# Patient Record
Sex: Male | Born: 1940
Health system: Southern US, Community
[De-identification: ages and names within clinical notes are randomized; demographics above are authoritative.]

## PROBLEM LIST (undated history)

## (undated) DIAGNOSIS — G20A1 Parkinson's disease without dyskinesia, without mention of fluctuations: Secondary | ICD-10-CM

## (undated) DIAGNOSIS — I471 Supraventricular tachycardia: Secondary | ICD-10-CM

## (undated) DIAGNOSIS — K649 Unspecified hemorrhoids: Secondary | ICD-10-CM

## (undated) DIAGNOSIS — I4589 Other specified conduction disorders: Secondary | ICD-10-CM

## (undated) DIAGNOSIS — G4700 Insomnia, unspecified: Secondary | ICD-10-CM

## (undated) DIAGNOSIS — H8110 Benign paroxysmal vertigo, unspecified ear: Secondary | ICD-10-CM

## (undated) DIAGNOSIS — E1169 Type 2 diabetes mellitus with other specified complication: Secondary | ICD-10-CM

## (undated) DIAGNOSIS — N529 Male erectile dysfunction, unspecified: Secondary | ICD-10-CM

## (undated) DIAGNOSIS — K224 Dyskinesia of esophagus: Secondary | ICD-10-CM

## (undated) DIAGNOSIS — K219 Gastro-esophageal reflux disease without esophagitis: Secondary | ICD-10-CM

## (undated) DIAGNOSIS — K5790 Diverticulosis of intestine, part unspecified, without perforation or abscess without bleeding: Secondary | ICD-10-CM

## (undated) DIAGNOSIS — I2510 Atherosclerotic heart disease of native coronary artery without angina pectoris: Secondary | ICD-10-CM

## (undated) DIAGNOSIS — E669 Obesity, unspecified: Secondary | ICD-10-CM

## (undated) DIAGNOSIS — G309 Alzheimer's disease, unspecified: Secondary | ICD-10-CM

## (undated) DIAGNOSIS — G20 Parkinson's disease: Secondary | ICD-10-CM

## (undated) DIAGNOSIS — Z9861 Coronary angioplasty status: Secondary | ICD-10-CM

## (undated) DIAGNOSIS — I214 Non-ST elevation (NSTEMI) myocardial infarction: Secondary | ICD-10-CM

## (undated) DIAGNOSIS — L409 Psoriasis, unspecified: Secondary | ICD-10-CM

## (undated) DIAGNOSIS — E785 Hyperlipidemia, unspecified: Secondary | ICD-10-CM

## (undated) DIAGNOSIS — Z86010 Personal history of colonic polyps: Secondary | ICD-10-CM

## (undated) DIAGNOSIS — F0280 Dementia in other diseases classified elsewhere without behavioral disturbance: Secondary | ICD-10-CM

## (undated) DIAGNOSIS — R001 Bradycardia, unspecified: Secondary | ICD-10-CM

## (undated) HISTORY — DX: Parkinson's disease: G20

## (undated) HISTORY — PX: WRIST FRACTURE SURGERY: SHX121

## (undated) HISTORY — PX: CATARACT EXTRACTION: SUR2

## (undated) HISTORY — DX: Insomnia, unspecified: G47.00

## (undated) HISTORY — DX: Dementia in other diseases classified elsewhere, unspecified severity, without behavioral disturbance, psychotic disturbance, mood disturbance, and anxiety: G30.9

## (undated) HISTORY — DX: Male erectile dysfunction, unspecified: N52.9

## (undated) HISTORY — DX: Dementia in other diseases classified elsewhere, unspecified severity, without behavioral disturbance, psychotic disturbance, mood disturbance, and anxiety: F02.80

## (undated) HISTORY — DX: Atherosclerotic heart disease of native coronary artery without angina pectoris: I25.10

## (undated) HISTORY — DX: Type 2 diabetes mellitus with other specified complication: E11.69

## (undated) HISTORY — DX: Bradycardia, unspecified: R00.1

## (undated) HISTORY — DX: Other specified conduction disorders: I45.89

## (undated) HISTORY — DX: Gastro-esophageal reflux disease without esophagitis: K21.9

## (undated) HISTORY — DX: Psoriasis, unspecified: L40.9

## (undated) HISTORY — PX: CORONARY ANGIOPLASTY WITH STENT PLACEMENT: SHX49

## (undated) HISTORY — DX: Parkinson's disease without dyskinesia, without mention of fluctuations: G20.A1

## (undated) HISTORY — DX: Non-ST elevation (NSTEMI) myocardial infarction: I21.4

## (undated) HISTORY — DX: Atherosclerotic heart disease of native coronary artery without angina pectoris: Z98.61

## (undated) HISTORY — DX: Personal history of colonic polyps: Z86.010

## (undated) HISTORY — DX: Benign paroxysmal vertigo, unspecified ear: H81.10

## (undated) HISTORY — PX: SHOULDER SURGERY: SHX246

## (undated) HISTORY — DX: Supraventricular tachycardia: I47.1

## (undated) HISTORY — DX: Hyperlipidemia, unspecified: E78.5

## (undated) HISTORY — PX: HERNIA REPAIR: SHX51

## (undated) HISTORY — DX: Unspecified hemorrhoids: K64.9

## (undated) HISTORY — DX: Dyskinesia of esophagus: K22.4

## (undated) HISTORY — DX: Diverticulosis of intestine, part unspecified, without perforation or abscess without bleeding: K57.90

## (undated) HISTORY — DX: Type 2 diabetes mellitus with other specified complication: E66.9

---

## 1997-10-05 ENCOUNTER — Encounter: Admission: RE | Admit: 1997-10-05 | Discharge: 1998-01-03 | Payer: Self-pay | Admitting: Emergency Medicine

## 1998-06-01 ENCOUNTER — Encounter: Payer: Self-pay | Admitting: Orthopedic Surgery

## 1998-06-01 ENCOUNTER — Ambulatory Visit (HOSPITAL_COMMUNITY): Admission: RE | Admit: 1998-06-01 | Discharge: 1998-06-01 | Payer: Self-pay | Admitting: Orthopedic Surgery

## 1999-02-19 ENCOUNTER — Encounter: Payer: Self-pay | Admitting: Orthopedic Surgery

## 1999-02-20 ENCOUNTER — Observation Stay (HOSPITAL_COMMUNITY): Admission: RE | Admit: 1999-02-20 | Discharge: 1999-02-21 | Payer: Self-pay | Admitting: Orthopedic Surgery

## 2001-02-23 DIAGNOSIS — I471 Supraventricular tachycardia, unspecified: Secondary | ICD-10-CM

## 2001-02-23 HISTORY — DX: Supraventricular tachycardia: I47.1

## 2001-02-23 HISTORY — DX: Supraventricular tachycardia, unspecified: I47.10

## 2001-07-12 DIAGNOSIS — Z9861 Coronary angioplasty status: Secondary | ICD-10-CM

## 2001-07-12 DIAGNOSIS — I2510 Atherosclerotic heart disease of native coronary artery without angina pectoris: Secondary | ICD-10-CM | POA: Insufficient documentation

## 2001-11-08 DIAGNOSIS — C4492 Squamous cell carcinoma of skin, unspecified: Secondary | ICD-10-CM

## 2001-11-08 HISTORY — DX: Squamous cell carcinoma of skin, unspecified: C44.92

## 2001-11-13 DIAGNOSIS — C4492 Squamous cell carcinoma of skin, unspecified: Secondary | ICD-10-CM

## 2001-11-13 HISTORY — DX: Squamous cell carcinoma of skin, unspecified: C44.92

## 2001-11-22 ENCOUNTER — Encounter: Admission: RE | Admit: 2001-11-22 | Discharge: 2001-11-22 | Payer: Self-pay | Admitting: Cardiology

## 2001-11-22 ENCOUNTER — Encounter: Payer: Self-pay | Admitting: Cardiology

## 2001-11-23 ENCOUNTER — Ambulatory Visit (HOSPITAL_COMMUNITY): Admission: RE | Admit: 2001-11-23 | Discharge: 2001-11-24 | Payer: Self-pay | Admitting: Cardiology

## 2001-11-23 DIAGNOSIS — I214 Non-ST elevation (NSTEMI) myocardial infarction: Secondary | ICD-10-CM

## 2001-11-23 HISTORY — DX: Non-ST elevation (NSTEMI) myocardial infarction: I21.4

## 2001-11-23 HISTORY — PX: PERCUTANEOUS CORONARY STENT INTERVENTION (PCI-S): SHX6016

## 2001-11-24 ENCOUNTER — Encounter: Payer: Self-pay | Admitting: Cardiology

## 2002-07-04 ENCOUNTER — Ambulatory Visit (HOSPITAL_COMMUNITY): Admission: RE | Admit: 2002-07-04 | Discharge: 2002-07-04 | Payer: Self-pay | Admitting: Family Medicine

## 2002-07-04 ENCOUNTER — Encounter: Payer: Self-pay | Admitting: Family Medicine

## 2002-07-25 DIAGNOSIS — K649 Unspecified hemorrhoids: Secondary | ICD-10-CM

## 2002-07-25 HISTORY — DX: Unspecified hemorrhoids: K64.9

## 2002-08-11 ENCOUNTER — Ambulatory Visit (HOSPITAL_COMMUNITY): Admission: RE | Admit: 2002-08-11 | Discharge: 2002-08-11 | Payer: Self-pay | Admitting: Internal Medicine

## 2002-08-11 HISTORY — PX: ESOPHAGOGASTRODUODENOSCOPY: SHX1529

## 2004-08-04 ENCOUNTER — Ambulatory Visit: Payer: Self-pay | Admitting: Family Medicine

## 2005-01-09 ENCOUNTER — Ambulatory Visit: Payer: Self-pay | Admitting: Family Medicine

## 2005-09-29 ENCOUNTER — Ambulatory Visit: Payer: Self-pay | Admitting: Family Medicine

## 2006-03-21 ENCOUNTER — Emergency Department (HOSPITAL_COMMUNITY): Admission: EM | Admit: 2006-03-21 | Discharge: 2006-03-21 | Payer: Self-pay | Admitting: Emergency Medicine

## 2006-05-19 ENCOUNTER — Ambulatory Visit: Payer: Self-pay | Admitting: Family Medicine

## 2006-05-19 LAB — CONVERTED CEMR LAB
ALT: 20 units/L (ref 0–40)
Alkaline Phosphatase: 56 units/L (ref 39–117)
Basophils Relative: 0.8 % (ref 0.0–1.0)
Bilirubin, Direct: 0.1 mg/dL (ref 0.0–0.3)
CO2: 28 meq/L (ref 19–32)
Calcium: 9.2 mg/dL (ref 8.4–10.5)
Creatinine, Ser: 0.9 mg/dL (ref 0.4–1.5)
Eosinophils Relative: 4.7 % (ref 0.0–5.0)
GFR calc Af Amer: 109 mL/min
Glucose, Bld: 162 mg/dL — ABNORMAL HIGH (ref 70–99)
HCT: 45.2 % (ref 39.0–52.0)
Hemoglobin: 15.8 g/dL (ref 13.0–17.0)
LDL Cholesterol: 122 mg/dL — ABNORMAL HIGH (ref 0–99)
Lymphocytes Relative: 31.6 % (ref 12.0–46.0)
Microalb Creat Ratio: 8.7 mg/g (ref 0.0–30.0)
Monocytes Absolute: 0.5 10*3/uL (ref 0.2–0.7)
Neutro Abs: 3.4 10*3/uL (ref 1.4–7.7)
Potassium: 4.5 meq/L (ref 3.5–5.1)
RDW: 12 % (ref 11.5–14.6)
TSH: 1.55 microintl units/mL (ref 0.35–5.50)
Total Bilirubin: 0.8 mg/dL (ref 0.3–1.2)
Total Protein: 7 g/dL (ref 6.0–8.3)
VLDL: 29 mg/dL (ref 0–40)
WBC: 6.3 10*3/uL (ref 4.5–10.5)

## 2006-05-24 ENCOUNTER — Encounter: Admission: RE | Admit: 2006-05-24 | Discharge: 2006-05-24 | Payer: Self-pay | Admitting: Family Medicine

## 2006-11-18 DIAGNOSIS — E785 Hyperlipidemia, unspecified: Secondary | ICD-10-CM | POA: Insufficient documentation

## 2006-11-18 DIAGNOSIS — E119 Type 2 diabetes mellitus without complications: Secondary | ICD-10-CM | POA: Insufficient documentation

## 2006-11-18 DIAGNOSIS — I2510 Atherosclerotic heart disease of native coronary artery without angina pectoris: Secondary | ICD-10-CM | POA: Insufficient documentation

## 2006-12-08 ENCOUNTER — Encounter: Payer: Self-pay | Admitting: Family Medicine

## 2006-12-08 ENCOUNTER — Telehealth: Payer: Self-pay | Admitting: Family Medicine

## 2006-12-21 ENCOUNTER — Encounter: Payer: Self-pay | Admitting: Family Medicine

## 2007-01-24 ENCOUNTER — Encounter: Payer: Self-pay | Admitting: Family Medicine

## 2007-04-24 HISTORY — PX: TRANSTHORACIC ECHOCARDIOGRAM: SHX275

## 2007-04-27 ENCOUNTER — Inpatient Hospital Stay (HOSPITAL_COMMUNITY): Admission: EM | Admit: 2007-04-27 | Discharge: 2007-04-29 | Payer: Self-pay | Admitting: Emergency Medicine

## 2007-04-27 ENCOUNTER — Encounter: Payer: Self-pay | Admitting: Family Medicine

## 2007-04-27 ENCOUNTER — Encounter (INDEPENDENT_AMBULATORY_CARE_PROVIDER_SITE_OTHER): Payer: Self-pay | Admitting: Cardiology

## 2007-04-27 HISTORY — PX: PERCUTANEOUS CORONARY STENT INTERVENTION (PCI-S): SHX6016

## 2007-04-29 ENCOUNTER — Encounter: Payer: Self-pay | Admitting: Family Medicine

## 2007-05-16 ENCOUNTER — Encounter: Payer: Self-pay | Admitting: Family Medicine

## 2007-07-27 ENCOUNTER — Ambulatory Visit: Payer: Self-pay | Admitting: Family Medicine

## 2007-07-27 DIAGNOSIS — N138 Other obstructive and reflux uropathy: Secondary | ICD-10-CM

## 2007-07-27 DIAGNOSIS — N401 Enlarged prostate with lower urinary tract symptoms: Secondary | ICD-10-CM | POA: Insufficient documentation

## 2007-07-27 LAB — CONVERTED CEMR LAB
ALT: 17 units/L (ref 0–53)
AST: 20 units/L (ref 0–37)
Alkaline Phosphatase: 58 units/L (ref 39–117)
Basophils Absolute: 0 10*3/uL (ref 0.0–0.1)
Bilirubin Urine: NEGATIVE
Bilirubin, Direct: 0.1 mg/dL (ref 0.0–0.3)
CO2: 25 meq/L (ref 19–32)
Chloride: 113 meq/L — ABNORMAL HIGH (ref 96–112)
Cholesterol: 195 mg/dL (ref 0–200)
Creatinine, Ser: 1.2 mg/dL (ref 0.4–1.5)
Eosinophils Absolute: 0.1 10*3/uL (ref 0.0–0.7)
GFR calc non Af Amer: 64 mL/min
Glucose, Urine, Semiquant: NEGATIVE
HDL: 41.8 mg/dL (ref >39.0)
Hgb A1c MFr Bld: 6.3 % — ABNORMAL HIGH (ref 4.6–6.0)
LDL Cholesterol: 136 mg/dL — ABNORMAL HIGH (ref 0–99)
Lymphocytes Relative: 22.2 % (ref 12.0–46.0)
MCHC: 35.4 g/dL (ref 30.0–36.0)
MCV: 94.1 fL (ref 78.0–100.0)
Neutrophils Relative %: 67.3 % (ref 43.0–77.0)
Platelets: 201 10*3/uL (ref 150–400)
Potassium: 4.1 meq/L (ref 3.5–5.1)
RBC: 4.39 M/uL (ref 4.22–5.81)
RDW: 12.2 % (ref 11.5–14.6)
Sodium: 134 meq/L — ABNORMAL LOW (ref 135–145)
Total Bilirubin: 0.9 mg/dL (ref 0.3–1.2)
VLDL: 17 mg/dL (ref 0–40)
pH: 6

## 2007-07-28 ENCOUNTER — Encounter: Payer: Self-pay | Admitting: Family Medicine

## 2007-09-21 ENCOUNTER — Inpatient Hospital Stay (HOSPITAL_COMMUNITY): Admission: EM | Admit: 2007-09-21 | Discharge: 2007-09-22 | Payer: Self-pay | Admitting: Emergency Medicine

## 2007-09-21 HISTORY — PX: PERCUTANEOUS CORONARY STENT INTERVENTION (PCI-S): SHX6016

## 2007-09-23 ENCOUNTER — Telehealth: Payer: Self-pay | Admitting: Internal Medicine

## 2007-09-27 ENCOUNTER — Ambulatory Visit: Payer: Self-pay | Admitting: Internal Medicine

## 2007-10-03 ENCOUNTER — Encounter: Payer: Self-pay | Admitting: Family Medicine

## 2007-10-13 ENCOUNTER — Telehealth: Payer: Self-pay | Admitting: Internal Medicine

## 2007-10-13 ENCOUNTER — Encounter (HOSPITAL_COMMUNITY): Admission: RE | Admit: 2007-10-13 | Discharge: 2008-01-11 | Payer: Self-pay | Admitting: Cardiology

## 2008-01-10 ENCOUNTER — Encounter: Payer: Self-pay | Admitting: Family Medicine

## 2008-04-18 ENCOUNTER — Ambulatory Visit: Payer: Self-pay | Admitting: Family Medicine

## 2008-04-18 DIAGNOSIS — K219 Gastro-esophageal reflux disease without esophagitis: Secondary | ICD-10-CM | POA: Insufficient documentation

## 2008-04-18 DIAGNOSIS — S8010XA Contusion of unspecified lower leg, initial encounter: Secondary | ICD-10-CM

## 2008-05-15 ENCOUNTER — Encounter: Payer: Self-pay | Admitting: Family Medicine

## 2008-10-25 ENCOUNTER — Encounter: Payer: Self-pay | Admitting: Family Medicine

## 2008-10-30 ENCOUNTER — Ambulatory Visit (HOSPITAL_COMMUNITY): Admission: RE | Admit: 2008-10-30 | Discharge: 2008-10-30 | Payer: Self-pay | Admitting: Cardiology

## 2009-05-01 ENCOUNTER — Ambulatory Visit: Payer: Self-pay | Admitting: Family Medicine

## 2009-05-01 DIAGNOSIS — N529 Male erectile dysfunction, unspecified: Secondary | ICD-10-CM | POA: Insufficient documentation

## 2009-05-03 LAB — CONVERTED CEMR LAB
AST: 26 units/L (ref 0–37)
Albumin: 3.8 g/dL (ref 3.5–5.2)
Alkaline Phosphatase: 79 units/L (ref 39–117)
Basophils Relative: 0.8 % (ref 0.0–3.0)
CO2: 27 meq/L (ref 19–32)
Calcium: 9.2 mg/dL (ref 8.4–10.5)
Chloride: 106 meq/L (ref 96–112)
Eosinophils Absolute: 0.3 10*3/uL (ref 0.0–0.7)
HDL: 70.3 mg/dL (ref >39.00)
Hemoglobin: 14.3 g/dL (ref 13.0–17.0)
Lymphocytes Relative: 22.3 % (ref 12.0–46.0)
MCHC: 33 g/dL (ref 30.0–36.0)
Neutro Abs: 4.3 10*3/uL (ref 1.4–7.7)
Potassium: 3.9 meq/L (ref 3.5–5.1)
RBC: 4.39 M/uL (ref 4.22–5.81)
Sodium: 138 meq/L (ref 135–145)
Testosterone: 221.96 ng/dL — ABNORMAL LOW (ref 350.00–890.00)
Total CHOL/HDL Ratio: 3
Total Protein: 6.9 g/dL (ref 6.0–8.3)
Vit D, 25-Hydroxy: 57 ng/mL (ref 30–89)

## 2009-05-09 ENCOUNTER — Encounter: Payer: Self-pay | Admitting: Family Medicine

## 2009-05-30 ENCOUNTER — Telehealth: Payer: Self-pay | Admitting: Family Medicine

## 2009-08-13 ENCOUNTER — Ambulatory Visit: Payer: Self-pay | Admitting: Family Medicine

## 2009-08-13 DIAGNOSIS — R42 Dizziness and giddiness: Secondary | ICD-10-CM | POA: Insufficient documentation

## 2009-08-16 ENCOUNTER — Encounter: Payer: Self-pay | Admitting: Family Medicine

## 2009-08-16 ENCOUNTER — Encounter: Admission: RE | Admit: 2009-08-16 | Discharge: 2009-08-16 | Payer: Self-pay | Admitting: Cardiology

## 2009-08-19 ENCOUNTER — Ambulatory Visit: Payer: Self-pay | Admitting: Family Medicine

## 2009-12-25 ENCOUNTER — Encounter: Payer: Self-pay | Admitting: Family Medicine

## 2010-03-23 LAB — CONVERTED CEMR LAB
Basophils Absolute: 0 10*3/uL (ref 0.0–0.1)
Bilirubin, Direct: 0.1 mg/dL (ref 0.0–0.3)
Calcium: 8.8 mg/dL (ref 8.4–10.5)
Cholesterol: 210 mg/dL (ref 0–200)
Direct LDL: 136.5 mg/dL
GFR calc Af Amer: 71 mL/min
GFR calc non Af Amer: 58 mL/min
HCT: 36.4 % — ABNORMAL LOW (ref 39.0–52.0)
Hemoglobin: 12.6 g/dL — ABNORMAL LOW (ref 13.0–17.0)
Hgb A1c MFr Bld: 6.5 % — ABNORMAL HIGH (ref 4.6–6.0)
MCHC: 34.5 g/dL (ref 30.0–36.0)
Microalb Creat Ratio: 19.4 mg/g (ref 0.0–30.0)
Microalb, Ur: 1.1 mg/dL (ref 0.0–1.9)
Monocytes Absolute: 0.6 10*3/uL (ref 0.1–1.0)
Neutro Abs: 3.5 10*3/uL (ref 1.4–7.7)
PSA: 2.92 ng/mL (ref 0.10–4.00)
Platelets: 153 10*3/uL (ref 150–400)
RDW: 12.2 % (ref 11.5–14.6)
Sodium: 137 meq/L (ref 135–145)
Total Bilirubin: 0.8 mg/dL (ref 0.3–1.2)
Triglycerides: 100 mg/dL (ref 0–149)
VLDL: 20 mg/dL (ref 0–40)

## 2010-03-25 NOTE — Progress Notes (Signed)
Summary: change of Glipizide  Phone Note Call from Patient   Caller: Spouse Call For: Nelwyn Salisbury MD Summary of Call: [phone] Nico[facility] was under the impression Dr. Clent Ridges was changing Glipizide to two times a day instead of once daily.  If so, needs this to be changed at pharmacy, please. Initial call taken by: Lynann Beaver CMA,  May 30, 2009 8:36 AM  Follow-up for Phone Call        actually I left the Glipizide at once a day, since his A1c was so good (6.3)  Follow-up by: Nelwyn Salisbury MD,  May 31, 2009 9:26 AM  Additional Follow-up for Phone Call Additional follow up Details #1::        spoke with wife about ok to leave med at once daily per Dr. Clent Ridges - a1c wnl no need to change meds. KIK Additional Follow-up by: Duard Brady LPN,  May 31, 2009 9:57 AM

## 2010-03-25 NOTE — Letter (Signed)
Summary: [facility]  [facility]   Imported By: Maryln Gottron 01/06/2010 15:18:02  _____________________________________________________________________  External Attachment:    Type:   Image     Comment:   External Document

## 2010-03-25 NOTE — Letter (Signed)
Summary: [facility]  [facility]   Imported By: Maryln Gottron 05/14/2009 10:20:32  _____________________________________________________________________  External Attachment:    Type:   Image     Comment:   External Document

## 2010-03-25 NOTE — Assessment & Plan Note (Signed)
Summary: CPX (PT WILL COME IN FASTING) // RS   Vital Signs:  Patient profile:   70 year old male Height:      69 inches Weight:      208 pounds BMI:     30.83 Temp:     97.7 degrees F oral Pulse rate:   61 / minute BP sitting:   110 / 74  (left arm) Cuff size:   large  Vitals Entered By: Alfred Levins, CMA (May 01, 2009 8:59 AM) CC: cpx, fasting   History of Present Illness: 70 yr old male for cpx. He feels well in general, although he has been under some stress lately. 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 He will see Dr. Clarene Duke next week. He asks about trying Viagra. His erections have been getting more difficult to achieve and to keep. His am fasting glucoses have been up somewhat, averaging 130 to 160.   Current Medications (verified): 1)  Glucotrol Xl 5 Mg  Tb24 (Glipizide) .Marland Kitchen.. 1 By Mouth Once Daily 2)  Bayer Aspirin 325 Mg  Tabs (Aspirin) .Marland Kitchen.. 1 By Mouth Once Daily 3)  Prilosec Otc 20 Mg Tbec (Omeprazole Magnesium) .... One By Mouth Daily 4)  Fish Oil   Oil (Fish Oil) .... 2 By Mouth Once Daily 5)  Cinnamon 500 Mg  Caps (Cinnamon) .... 2 By Mouth Once Daily 6)  Vitamin D 2000 Unit Tabs (Cholecalciferol) .... 3 By Mouth Once Daily 7)  Potassium Chloride Cr 10 Meq  Tbcr (Potassium Chloride) .Marland Kitchen.. 1 By Mouth Once Daily 8)  Onetouch Ultra Test  Strp (Glucose Blood) .... Once Daily 9)  Plavix 75 Mg Tabs (Clopidogrel Bisulfate) .... Take 1 Tab By Mouth Daily 10)  Niaspan 500 Mg Tbcr (Niacin (Antihyperlipidemic)) .... As Needed 11)  Alfalfa 250 Mg Tabs (Alfalfa) .... Once Daily  Allergies (verified): 1)  ! Sulfa 2)  ! * Statins  Past History:  Past Medical History: SVT Diabetes mellitus, type II Insomnia Coronary artery disease, sees Dr. Clarene Duke Hyperlipidemia sees Dr. Jorja Loa for skin checks MI 3-09 per Dr. Clarene Duke GERD psoriasis  Past Surgical History: Reviewed history from 07/27/2007 and no changes required. Rotator cuff repair,  left, per Dr. Darrelyn Hillock right wrist surgery to repair a fracture cardiac stent placed 10-03 in LAD, another in RCA 3-09 normal stress test 3-06 EGD with esophageal dilation  08-11-02 per Dr. Leone Payor colonoscopy 08-11-02 per Dr. Leone Payor, repeat in 10 yrs Cataract extraction right inguinal hernia repair  Family History: Reviewed history from 07/27/2007 and no changes required. Family History Diabetes 1st degree relative Family History of Cardiovascular disorder dementia in father  Social History: Reviewed history from 11/18/2006 and no changes required. Occupation: Married Current Smoker Alcohol use-yes  Review of Systems  The patient denies anorexia, fever, weight loss, weight gain, vision loss, decreased hearing, hoarseness, chest pain, syncope, dyspnea on exertion, peripheral edema, prolonged cough, headaches, hemoptysis, abdominal pain, melena, hematochezia, severe indigestion/heartburn, hematuria, incontinence, genital sores, muscle weakness, suspicious skin lesions, transient blindness, difficulty walking, depression, unusual weight change, abnormal bleeding, enlarged lymph nodes, angioedema, breast masses, and testicular masses.    Physical Exam  General:  overweight-appearing.   Head:  Normocephalic and atraumatic without obvious abnormalities. No apparent alopecia or balding. Eyes:  No corneal or conjunctival inflammation noted. EOMI. Perrla. Funduscopic exam benign, without hemorrhages, exudates or papilledema. Vision grossly normal. Ears:  External ear exam shows no significant lesions or deformities.  Otoscopic examination reveals clear canals,  tympanic membranes are intact bilaterally without bulging, retraction, inflammation or discharge. Hearing is grossly normal bilaterally. Nose:  External nasal examination shows no deformity or inflammation. Nasal mucosa are pink and moist without lesions or exudates. Mouth:  Oral mucosa and oropharynx without lesions or exudates.  Teeth  in good repair. Neck:  No deformities, masses, or tenderness noted. Chest Wall:  No deformities, masses, tenderness or gynecomastia noted. Lungs:  Normal respiratory effort, chest expands symmetrically. Lungs are clear to auscultation, no crackles or wheezes. Heart:  Normal rate and regular rhythm. S1 and S2 normal without gallop, murmur, click, rub or other extra sounds. Abdomen:  Bowel sounds positive,abdomen soft and non-tender without masses, organomegaly or hernias noted. Rectal:  No external abnormalities noted. Normal sphincter tone. No rectal masses or tenderness. Heme neg. Genitalia:  Testes bilaterally descended without nodularity, tenderness or masses. No scrotal masses or lesions. No penis lesions or urethral discharge. Prostate:  no nodules, no asymmetry, no induration, and 2+ enlarged.   Msk:  No deformity or scoliosis noted of thoracic or lumbar spine.   Pulses:  R and L carotid,radial,femoral,dorsalis pedis and posterior tibial pulses are full and equal bilaterally Extremities:  No clubbing, cyanosis, edema, or deformity noted with normal full range of motion of all joints.   Neurologic:  No cranial nerve deficits noted. Station and gait are normal. Plantar reflexes are down-going bilaterally. DTRs are symmetrical throughout. Sensory, motor and coordinative functions appear intact. Skin:  Intact without suspicious lesions or rashes Cervical Nodes:  No lymphadenopathy noted Axillary Nodes:  No palpable lymphadenopathy Inguinal Nodes:  No significant adenopathy Psych:  Cognition and judgment appear intact. Alert and cooperative with normal attention span and concentration. No apparent delusions, illusions, hallucinations   Impression & Recommendations:  Problem # 1:  BENIGN PROSTATIC HYPERTROPHY, WITH OBSTRUCTION (ICD-600.01)  Orders: TLB-PSA (Prostate Specific Antigen) (84153-PSA)  Problem # 2:  HYPERLIPIDEMIA (ICD-272.4)  His updated medication list for this problem  includes:    Niaspan 500 Mg Tbcr (Niacin (antihyperlipidemic)) .Marland Kitchen... As needed  Problem # 3:  CORONARY ARTERY DISEASE (ICD-414.00)  His updated medication list for this problem includes:    Bayer Aspirin 325 Mg Tabs (Aspirin) .Marland Kitchen... 1 by mouth once daily    Plavix 75 Mg Tabs (Clopidogrel bisulfate) .Marland Kitchen... Take 1 tab by mouth daily  Orders: T-Vitamin D (25-Hydroxy) [phone])  Problem # 4:  DIABETES MELLITUS, TYPE II (ICD-250.00)  His updated medication list for this problem includes:    Glucotrol Xl 5 Mg Tb24 (Glipizide) .Marland Kitchen... 1 by mouth once daily    Bayer Aspirin 325 Mg Tabs (Aspirin) .Marland Kitchen... 1 by mouth once daily  Orders: UA Dipstick w/o Micro (automated)  (81003) Venipuncture (34742) TLB-Lipid Panel (80061-LIPID) TLB-BMP (Basic Metabolic Panel-BMET) (80048-METABOL) TLB-CBC Platelet - w/Differential (85025-CBCD) TLB-Hepatic/Liver Function Pnl (80076-HEPATIC) TLB-TSH (Thyroid Stimulating Hormone) (84443-TSH) TLB-A1C / Hgb A1C (Glycohemoglobin) (83036-A1C) TLB-Microalbumin/Creat Ratio, Urine (82043-MALB)  Problem # 5:  ERECTILE DYSFUNCTION, ORGANIC (ICD-607.84)  His updated medication list for this problem includes:    Viagra 100 Mg Tabs (Sildenafil citrate) .Marland Kitchen... As needed  Orders: TLB-Testosterone, Total (84403-TESTO)  Complete Medication List: 1)  Glucotrol Xl 5 Mg Tb24 (Glipizide) .Marland Kitchen.. 1 by mouth once daily 2)  Bayer Aspirin 325 Mg Tabs (Aspirin) .Marland Kitchen.. 1 by mouth once daily 3)  Prilosec Otc 20 Mg Tbec (Omeprazole magnesium) .... One by mouth daily 4)  Fish Oil Oil (Fish oil) .... 2 by mouth once daily 5)  Cinnamon 500 Mg Caps (Cinnamon) .... 2 by mouth  once daily 6)  Vitamin D 2000 Unit Tabs (Cholecalciferol) .... 3 by mouth once daily 7)  Potassium Chloride Cr 10 Meq Tbcr (Potassium chloride) .Marland Kitchen.. 1 by mouth once daily 8)  Onetouch Ultra Test Strp (Glucose blood) .... Once daily 9)  Plavix 75 Mg Tabs (Clopidogrel bisulfate) .... Take 1 tab by mouth daily 10)   Niaspan 500 Mg Tbcr (Niacin (antihyperlipidemic)) .... As needed 11)  Alfalfa 250 Mg Tabs (Alfalfa) .... Once daily 12)  Viagra 100 Mg Tabs (Sildenafil citrate) .... As needed  Other Orders: Pneumococcal Vaccine (04540) Admin 1st Vaccine (98119)  Patient Instructions: 1)  Check labs today. Try samples of Viagra.  Prescriptions: VIAGRA 100 MG TABS (SILDENAFIL CITRATE) as needed  #10 x 11   Entered and Authorized by:   Nelwyn Salisbury MD   Signed by:   Nelwyn Salisbury MD on 05/01/2009   Method used:   Electronically to        [facility]  [phone]* (retail)       [address]       [place], [place]  [address]       Ph: [phone] or [phone]       Fax: [phone]   RxID:   [id number] POTASSIUM CHLORIDE CR 10 MEQ  TBCR (POTASSIUM CHLORIDE) 1 by mouth once daily  #30 x 11   Entered and Authorized by:   Nelwyn Salisbury MD   Signed by:   Nelwyn Salisbury MD on 05/01/2009   Method used:   Electronically to        [facility]  [phone]* (retail)       [address]       [place], [place]  [address]       Ph: [phone] or [phone]       Fax: [phone]   RxID:   [id number] GLUCOTROL XL 5 MG  TB24 (GLIPIZIDE) 1 by mouth once daily  #30 x 11   Entered and Authorized by:   Nelwyn Salisbury MD   Signed by:   Nelwyn Salisbury MD on 05/01/2009   Method used:   Electronically to        [facility]  [phone]* (retail)       [address]       [place], [place]  [address]       Ph: [phone] or [phone]       Fax: [phone]   RxID:   [id number]   Preventive Care Screening  Colonoscopy:    Date:  08/11/2002    Results:  Diverticulosis     Immunizations Administered:  Pneumonia Vaccine:    Vaccine Type: Pneumovax    Site: left deltoid    Mfr: Merck    Dose: 0.5 ml    Route: IM    Given by: Alfred Levins, CMA    Exp. Date: 06/03/2010    Lot #:  [id number]   Appended Document: CPX (PT WILL COME IN FASTING) // RS  Laboratory Results   Urine Tests    Routine Urinalysis   Color: yellow Appearance: Clear Glucose: negative   (Normal Range: Negative) Bilirubin: negative   (Normal Range: Negative) Ketone: negative   (Normal Range: Negative) Spec. Gravity: 1.015   (Normal Range: 1.003-1.035) Blood: trace-lysed   (Normal Range: Negative) pH: 7.0   (Normal  Range: 5.0-8.0) Protein: negative   (Normal Range: Negative) Urobilinogen: 0.2   (Normal Range: 0-1) Nitrite: negative   (Normal Range: Negative) Leukocyte Esterace: negative   (Normal Range: Negative)    Comments: Rita Ohara  May 01, 2009 12:32 PM

## 2010-03-25 NOTE — Assessment & Plan Note (Signed)
Summary: DIZZINESS // RS   Vital Signs:  Patient profile:   70 year old male Weight:      208 pounds BMI:     30.83 Temp:     98.2 degrees F Pulse rate:   64 / minute Pulse rhythm:   regular BP sitting:   126 / 76  (left arm) Cuff size:   regular  Vitals Entered By: Raechel Ache, RN (August 13, 2009 4:38 PM) CC: C/o dizziness past few months and worse last week. FBS 155 today.   History of Present Illness: Here for occasional spells of lightheadedness which usually are brief and occur when he stoops or bends over, and then stands up. He denies any dizziness with the room slinning, no symtpoms when he moves his head side to side, no symptoms when he rolls over in bed. No HA or SOB or chest pains or palpitations. No vision or speech  changes or other neurologic deficits. His BP at home is often low in the 90s systolic and 60s diastolic. His am glucoses range from 110 to 150. He very rarely uses Viagra.  Allergies: 1)  ! Sulfa 2)  ! * Statins  Past History:  Past Medical History: Reviewed history from 05/01/2009 and no changes required. SVT Diabetes mellitus, type II Insomnia Coronary artery disease, sees Dr. Clarene Duke Hyperlipidemia sees Dr. Jorja Loa for skin checks MI 3-09 per Dr. Clarene Duke GERD psoriasis  Past Surgical History: Reviewed history from 07/27/2007 and no changes required. Rotator cuff repair, left, per Dr. Darrelyn Hillock right wrist surgery to repair a fracture cardiac stent placed 10-03 in LAD, another in RCA 3-09 normal stress test 3-06 EGD with esophageal dilation  08-11-02 per Dr. Leone Payor colonoscopy 08-11-02 per Dr. Leone Payor, repeat in 10 yrs Cataract extraction right inguinal hernia repair  Review of Systems  The patient denies anorexia, fever, weight loss, weight gain, vision loss, decreased hearing, hoarseness, chest pain, syncope, dyspnea on exertion, peripheral edema, prolonged cough, headaches, hemoptysis, abdominal pain, melena, hematochezia, severe  indigestion/heartburn, hematuria, incontinence, genital sores, muscle weakness, suspicious skin lesions, transient blindness, difficulty walking, depression, unusual weight change, abnormal bleeding, enlarged lymph nodes, angioedema, breast masses, and testicular masses.    Physical Exam  General:  Well-developed,well-nourished,in no acute distress; alert,appropriate and cooperative throughout examination Head:  Normocephalic and atraumatic without obvious abnormalities. No apparent alopecia or balding. Eyes:  No corneal or conjunctival inflammation noted. EOMI. Perrla. Funduscopic exam benign, without hemorrhages, exudates or papilledema. Vision grossly normal. Ears:  External ear exam shows no significant lesions or deformities.  Otoscopic examination reveals clear canals, tympanic membranes are intact bilaterally without bulging, retraction, inflammation or discharge. Hearing is grossly normal bilaterally. Neck:  No deformities, masses, or tenderness noted. No bruits Lungs:  Normal respiratory effort, chest expands symmetrically. Lungs are clear to auscultation, no crackles or wheezes. Heart:  Normal rate and regular rhythm. S1 and S2 normal without gallop, murmur, click, rub or other extra sounds. Neurologic:  alert & oriented X3, cranial nerves II-XII intact, strength normal in all extremities, and gait normal.     Impression & Recommendations:  Problem # 1:  POSTURAL LIGHTHEADEDNESS (ICD-780.4)  Problem # 2:  CORONARY ARTERY DISEASE (ICD-414.00)  His updated medication list for this problem includes:    Bayer Aspirin 325 Mg Tabs (Aspirin) .Marland Kitchen... 1 by mouth once daily    Plavix 75 Mg Tabs (Clopidogrel bisulfate) .Marland Kitchen... Take 1 tab by mouth daily  Problem # 3:  DIABETES MELLITUS, TYPE II (ICD-250.00)  The following  medications were removed from the medication list:    Glucotrol Xl 5 Mg Tb24 (Glipizide) .Marland Kitchen... 1 by mouth once daily His updated medication list for this problem includes:     Bayer Aspirin 325 Mg Tabs (Aspirin) .Marland Kitchen... 1 by mouth once daily    Glipizide 5 Mg Tabs (Glipizide) .Marland Kitchen..Marland Kitchen Two times a day  Complete Medication List: 1)  Bayer Aspirin 325 Mg Tabs (Aspirin) .Marland Kitchen.. 1 by mouth once daily 2)  Prilosec Otc 20 Mg Tbec (Omeprazole magnesium) .... One by mouth daily 3)  Fish Oil Oil (Fish oil) .... 2 by mouth once daily 4)  Cinnamon 500 Mg Caps (Cinnamon) .... 2 by mouth once daily 5)  Vitamin D 2000 Unit Tabs (Cholecalciferol) .... 3 by mouth once daily 6)  Potassium Chloride Cr 10 Meq Tbcr (Potassium chloride) .Marland Kitchen.. 1 by mouth once daily 7)  Onetouch Ultra Test Strp (Glucose blood) .... Once daily 8)  Plavix 75 Mg Tabs (Clopidogrel bisulfate) .... Take 1 tab by mouth daily 9)  Niaspan 500 Mg Tbcr (Niacin (antihyperlipidemic)) .... As needed 10)  Alfalfa 250 Mg Tabs (Alfalfa) .... Once daily 11)  Viagra 100 Mg Tabs (Sildenafil citrate) .... As needed 12)  Glipizide 5 Mg Tabs (Glipizide) .... Two times a day  Patient Instructions: 1)  This seems to result from orthostatic hypotension rather than from vertigo. He drinks plenty of fluids. This does not seem to result from any medication he is taking. Will refer him back to Dr. Clarene Duke to evaluate.  Prescriptions: GLIPIZIDE 5 MG TABS (GLIPIZIDE) two times a day  #60 x 11   Entered and Authorized by:   Nelwyn Salisbury MD   Signed by:   Nelwyn Salisbury MD on 08/13/2009   Method used:   Electronically to        [facility]  [phone]* (retail)       [address]       [place], [place]  [address]       Ph: [phone] or [phone]       Fax: [phone]   RxID:   [id number]

## 2010-03-25 NOTE — Assessment & Plan Note (Signed)
Summary: SINUSITIS // RS   Vital Signs:  Patient profile:   70 year old male Weight:      214 pounds Temp:     98.7 degrees F BP sitting:   158 / 80  (left arm) Cuff size:   regular  Vitals Entered By: Raechel Ache, RN (August 19, 2009 4:40 PM) CC: Saw Dr Clarene Duke last week for dizziness, labs ok but MRI/head shows sinus infection.   History of Present Illness: Here to ask about treating a sinus infection. I saw him last week for dizzy spells that sounded more like orthostasis to me. I had him see Dr. Clarene Duke, who did some labs that returned as normal. he also ordered a brain MRI / MRA on 08-16-09 which showed some fluid in the left frontal and ethmoid sinuses. Apparently Dr. Clarene Duke felt these may be contributing to the dizziness. Joe Murray has had no other symptoms.   Allergies: 1)  ! Sulfa 2)  ! * Statins  Past History:  Past Medical History: Reviewed history from 05/01/2009 and no changes required. SVT Diabetes mellitus, type II Insomnia Coronary artery disease, sees Dr. Clarene Duke Hyperlipidemia sees Dr. Jorja Loa for skin checks MI 3-09 per Dr. Clarene Duke GERD psoriasis  Past Surgical History: Reviewed history from 07/27/2007 and no changes required. Rotator cuff repair, left, per Dr. Darrelyn Hillock right wrist surgery to repair a fracture cardiac stent placed 10-03 in LAD, another in RCA 3-09 normal stress test 3-06 EGD with esophageal dilation  08-11-02 per Dr. Leone Payor colonoscopy 08-11-02 per Dr. Leone Payor, repeat in 10 yrs Cataract extraction right inguinal hernia repair  Review of Systems  The patient denies anorexia, fever, weight loss, weight gain, vision loss, decreased hearing, hoarseness, chest pain, syncope, dyspnea on exertion, peripheral edema, prolonged cough, headaches, hemoptysis, abdominal pain, melena, hematochezia, severe indigestion/heartburn, hematuria, incontinence, genital sores, muscle weakness, suspicious skin lesions, transient blindness, difficulty walking,  depression, unusual weight change, abnormal bleeding, enlarged lymph nodes, angioedema, breast masses, and testicular masses.    Physical Exam  General:  Well-developed,well-nourished,in no acute distress; alert,appropriate and cooperative throughout examination Head:  Normocephalic and atraumatic without obvious abnormalities. No apparent alopecia or balding. Eyes:  No corneal or conjunctival inflammation noted. EOMI. Perrla. Funduscopic exam benign, without hemorrhages, exudates or papilledema. Vision grossly normal. Ears:  External ear exam shows no significant lesions or deformities.  Otoscopic examination reveals clear canals, tympanic membranes are intact bilaterally without bulging, retraction, inflammation or discharge. Hearing is grossly normal bilaterally. Nose:  External nasal examination shows no deformity or inflammation. Nasal mucosa are pink and moist without lesions or exudates. Mouth:  Oral mucosa and oropharynx without lesions or exudates.  Teeth in good repair. Neck:  No deformities, masses, or tenderness noted. Lungs:  Normal respiratory effort, chest expands symmetrically. Lungs are clear to auscultation, no crackles or wheezes. Heart:  Normal rate and regular rhythm. S1 and S2 normal without gallop, murmur, click, rub or other extra sounds. Neurologic:  alert & oriented X3, cranial nerves II-XII intact, and gait normal.     Impression & Recommendations:  Problem # 1:  ACUTE SINUSITIS, UNSPECIFIED (ICD-461.9)  His updated medication list for this problem includes:    Augmentin 875-125 Mg Tabs (Amoxicillin-pot clavulanate) .Marland Kitchen..Marland Kitchen Two times a day  Problem # 2:  POSTURAL LIGHTHEADEDNESS (ICD-780.4)  Complete Medication List: 1)  Bayer Aspirin 325 Mg Tabs (Aspirin) .Marland Kitchen.. 1 by mouth once daily 2)  Prilosec Otc 20 Mg Tbec (Omeprazole magnesium) .... One by mouth daily 3)  Fish Oil  Oil (Fish oil) .... 2 by mouth once daily 4)  Cinnamon 500 Mg Caps (Cinnamon) .... 2 by mouth  once daily 5)  Vitamin D 2000 Unit Tabs (Cholecalciferol) .... 3 by mouth once daily 6)  Potassium Chloride Cr 10 Meq Tbcr (Potassium chloride) .Marland Kitchen.. 1 by mouth once daily 7)  Onetouch Ultra Test Strp (Glucose blood) .... Once daily 8)  Plavix 75 Mg Tabs (Clopidogrel bisulfate) .... Take 1 tab by mouth daily 9)  Niaspan 500 Mg Tbcr (Niacin (antihyperlipidemic)) .... As needed 10)  Alfalfa 250 Mg Tabs (Alfalfa) .... Once daily 11)  Viagra 100 Mg Tabs (Sildenafil citrate) .... As needed 12)  Glipizide 5 Mg Tabs (Glipizide) .... Two times a day 13)  Augmentin 875-125 Mg Tabs (Amoxicillin-pot clavulanate) .... Two times a day  Patient Instructions: 1)  Please schedule a follow-up appointment as needed .  Prescriptions: AUGMENTIN 875-125 MG TABS (AMOXICILLIN-POT CLAVULANATE) two times a day  #28 x 0   Entered and Authorized by:   Nelwyn Salisbury MD   Signed by:   Nelwyn Salisbury MD on 08/19/2009   Method used:   Electronically to        [facility]  [phone]* (retail)       [address]       [place], [place]  [address]       Ph: [phone] or [phone]       Fax: [phone]   RxID:   [id number]

## 2010-06-24 ENCOUNTER — Encounter: Payer: Self-pay | Admitting: Family Medicine

## 2010-06-24 ENCOUNTER — Ambulatory Visit (INDEPENDENT_AMBULATORY_CARE_PROVIDER_SITE_OTHER): Payer: Medicare Other | Admitting: Family Medicine

## 2010-06-24 VITALS — BP 118/62 | HR 55 | Temp 97.9°F | Resp 14 | Ht 69.0 in | Wt 226.3 lb

## 2010-06-24 DIAGNOSIS — N401 Enlarged prostate with lower urinary tract symptoms: Secondary | ICD-10-CM

## 2010-06-24 DIAGNOSIS — N139 Obstructive and reflux uropathy, unspecified: Secondary | ICD-10-CM

## 2010-06-24 DIAGNOSIS — E119 Type 2 diabetes mellitus without complications: Secondary | ICD-10-CM

## 2010-06-24 DIAGNOSIS — E559 Vitamin D deficiency, unspecified: Secondary | ICD-10-CM

## 2010-06-24 DIAGNOSIS — N138 Other obstructive and reflux uropathy: Secondary | ICD-10-CM

## 2010-06-24 DIAGNOSIS — Z136 Encounter for screening for cardiovascular disorders: Secondary | ICD-10-CM

## 2010-06-24 DIAGNOSIS — R413 Other amnesia: Secondary | ICD-10-CM

## 2010-06-24 DIAGNOSIS — I2510 Atherosclerotic heart disease of native coronary artery without angina pectoris: Secondary | ICD-10-CM

## 2010-06-24 LAB — BASIC METABOLIC PANEL
BUN: 16 mg/dL (ref 6–23)
Calcium: 9.2 mg/dL (ref 8.4–10.5)
GFR: 63.55 mL/min (ref >60.00)
Glucose, Bld: 125 mg/dL — ABNORMAL HIGH (ref 70–99)

## 2010-06-24 LAB — MICROALBUMIN / CREATININE URINE RATIO: Creatinine,U: 44 mg/dL

## 2010-06-24 LAB — LIPID PANEL
Cholesterol: 231 mg/dL — ABNORMAL HIGH (ref 0–200)
HDL: 51.3 mg/dL (ref >39.00)
Triglycerides: 147 mg/dL (ref 0.0–149.0)

## 2010-06-24 LAB — POCT URINALYSIS DIPSTICK
Glucose, UA: NEGATIVE
Nitrite, UA: NEGATIVE
Protein, UA: NEGATIVE
Urobilinogen, UA: 0.2

## 2010-06-24 LAB — CBC WITH DIFFERENTIAL/PLATELET
Basophils Absolute: 0 10*3/uL (ref 0.0–0.1)
Eosinophils Relative: 2.6 % (ref 0.0–5.0)
MCV: 93.1 fl (ref 78.0–100.0)
Monocytes Absolute: 0.6 10*3/uL (ref 0.1–1.0)
Neutrophils Relative %: 65.3 % (ref 43.0–77.0)
Platelets: 202 10*3/uL (ref 150.0–400.0)
RDW: 13.7 % (ref 11.5–14.6)
WBC: 7 10*3/uL (ref 4.5–10.5)

## 2010-06-24 LAB — HEPATIC FUNCTION PANEL
Albumin: 3.9 g/dL (ref 3.5–5.2)
Total Protein: 6.6 g/dL (ref 6.0–8.3)

## 2010-06-24 LAB — VITAMIN B12: Vitamin B-12: 365 pg/mL (ref 211–911)

## 2010-06-24 LAB — TSH: TSH: 1.22 u[IU]/mL (ref 0.35–5.50)

## 2010-06-24 MED ORDER — OMEPRAZOLE 20 MG PO CPDR: 20.0000 mg | DELAYED_RELEASE_CAPSULE | Freq: Every day | ORAL | Status: DC

## 2010-06-24 MED ORDER — GLUCOSE BLOOD VI STRP: ORAL_STRIP | Status: DC

## 2010-06-24 MED ORDER — POTASSIUM CHLORIDE 10 MEQ PO CPCR: 10.0000 meq | ORAL_CAPSULE | Freq: Every day | ORAL | Status: DC

## 2010-06-24 MED ORDER — GLIPIZIDE 5 MG PO TABS: 5.0000 mg | ORAL_TABLET | Freq: Two times a day (BID) | ORAL | Status: DC

## 2010-06-24 MED ORDER — LANCETS MISC: 1.0000 "application " | Freq: Every day | Status: DC

## 2010-06-24 MED ORDER — DONEPEZIL HCL 10 MG PO TABS: 10.0000 mg | ORAL_TABLET | Freq: Every day | ORAL | Status: DC

## 2010-06-24 NOTE — Progress Notes (Signed)
  Subjective:    Patient ID: Joe Murray, male    DOB: 04/14/1940, 70 y.o.   MRN: [id number]  HPI 70 yr old male for a cpx. He feels fine but is worried about memory loss. Over the past year he has had more trouble remembering things that he needs to do every day. He loses things, etc. He says he has written himself reminder notes to do things for years, but lately he forgets to even look at the notes. He still drives and has no trouble with directions. He asks if he can take a medication to improve the memory. He denies any anxiety or depression symptoms. 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 He is still involved with helping the new manager get started. He denies any chest pains or SOB. He still sees Dr. Clarene Duke yearly.    Review of Systems  Constitutional: Negative.   HENT: Negative.   Eyes: Negative.   Respiratory: Negative.   Cardiovascular: Negative.   Gastrointestinal: Negative.   Genitourinary: Negative.   Musculoskeletal: Negative.   Skin: Negative.   Neurological: Negative.   Hematological: Negative.   Psychiatric/Behavioral: Negative.        Objective:   Physical Exam  Constitutional: He is oriented to person, place, and time. He appears well-developed and well-nourished. No distress.  HENT:  Head: Normocephalic and atraumatic.  Right Ear: External ear normal.  Left Ear: External ear normal.  Nose: Nose normal.  Mouth/Throat: Oropharynx is clear and moist. No oropharyngeal exudate.  Eyes: Conjunctivae and EOM are normal. Pupils are equal, round, and reactive to light. Right eye exhibits no discharge. Left eye exhibits no discharge. No scleral icterus.  Neck: Neck supple. No JVD present. No tracheal deviation present. No thyromegaly present.  Cardiovascular: Normal rate, regular rhythm, normal heart sounds and intact distal pulses.  Exam reveals no gallop and no friction rub.     No murmur heard.      EKG normal   Pulmonary/Chest: Effort normal and breath sounds normal. No respiratory distress. He has no wheezes. He has no rales. He exhibits no tenderness.  Abdominal: Soft. Bowel sounds are normal. He exhibits no distension and no mass. There is no tenderness. There is no rebound and no guarding.  Genitourinary: Rectum normal, prostate normal and penis normal. Guaiac negative stool. No penile tenderness.  Musculoskeletal: Normal range of motion. He exhibits no edema and no tenderness.  Lymphadenopathy:    He has no cervical adenopathy.  Neurological: He is alert and oriented to person, place, and time. He has normal reflexes. No cranial nerve deficit. He exhibits normal muscle tone. Coordination normal.  Skin: Skin is warm and dry. No rash noted. He is not diaphoretic. No erythema. No pallor.  Psychiatric: He has a normal mood and affect. His behavior is normal. Judgment and thought content normal.          Assessment & Plan:  Get fasting labs today. He is probably showing signs of early dementia, so we will try him on Aricept daily. Recheck in 3 months.

## 2010-06-25 LAB — VITAMIN D 25 HYDROXY (VIT D DEFICIENCY, FRACTURES): Vit D, 25-Hydroxy: 102 ng/mL — ABNORMAL HIGH (ref 30–89)

## 2010-06-25 NOTE — Progress Notes (Signed)
Pt informed. Pt inquiring about his Vitamin D level result (102 H). Pt states he is taking 10,000 IUs; Ok to remain at this dose per Dr Clent Ridges.

## 2010-07-08 NOTE — Discharge Summary (Signed)
NAMEJAYVEN, Murray              ACCOUNT NO.:  [id number]   MEDICAL RECORD NO.:  [id number]          PATIENT TYPE:  INP   LOCATION:  2923                         FACILITY:  [facility]   PHYSICIAN:  Thereasa Solo. Little, M.D. DATE OF BIRTH:  06-05-1940   DATE OF ADMISSION:  09/20/2007  DATE OF DISCHARGE:  09/22/2007                               DISCHARGE SUMMARY   Joe Murray is a 70 year old male patient of Dr. Julieanne Manson  who has known coronary artery disease.  He previously had a RCA stent  and LAD stents.  His last intervention was in March 2009, at which time  he had a Cypher stent placed to his RCA, at that time he had an ostial  diagonal 60%.  This was not intervened at that time.  He came into the  hospital this time with chest pain.  Pain apparently eased with somewhat  of a GI cocktail, but he was given nitroglycerin and did not resolve.  He was seen by Dr. Clarene Duke on September 21, 2007, considered stable and  needing for a cardiac cath because of his previous cath in March 2009.  His CK-MBs and troponins were negative this admission.  Thus, on September 21, 2007 he went to the cath lab.  He was found to have bowel in his  Circ and large OM, approximately it was 30-40% LAD, the stent was widely  patent.  He had jailed first diagonal, first diagonal had an ostial 66-  70% similar to March 2009 cath.  RCA stent distal was widely patent.  The PDA was okay.  His EF was greater than 60%.  He went on to have a  3.0 x 18 Cypher stent placed to his diagonal.  Postprocedure, he did  well.  GI was called for a consult secondary to history of GERD and  esophageal spasm.  He was seen by Dr. Juanda Chance and it was decided to do an  esophagram, which is pending at the time of this dictation and after he  is seen by the GI Service and cleared for discharge, he will go home.  He was seen by Cardiac Rehab, education was given and also the tobacco  cessation nurse was consulted however, the patient is not  interested in  quitting smoking.  He was seen again by Dr. Clarene Duke on September 22, 2007.   His blood pressure was 112/53, heart rate was 51, O2 sats were 97% on  room air, respirations 16, and temperature was 97.9.   LABORATORY DATA:  Postprocedure, his troponin was 0.08.  CK-MB and  troponin were negative.  His sodium was 135, potassium 4.0, BUN 16, and  creatinine 1.08.  His hemoglobin was 13.7, hematocrit 38.9, WBC 6.6, and  platelets 156.  TSH was 1.350, magnesium was 2.1, total cholesterol was  178, triglycerides 193, HDL was 35, and LDL was 104.  He apparently is  intolerant to statins.   CURRENT MEDICATIONS:  1. Zetia 10 mg every day.  2. Glipizide 5 mg every day.  3. Aspirin 325 mg every day.  4. Alfalfa every day.  5.  Fish oil every day.  6. Plavix 75 mg every day.  7. We added lisinopril 5 mg every day and Protonix 40 mg every day.  8. He also should have nitroglycerin 1/150 under tongue every 5      minutes x3 if needed for chest pain.   We recommend that he go to Cardiac Rehab.  He should do no lifting,  pushing, or pulling for a week and no driving x1 day.  He was told by GI  that he should eat his food slowly and chew well.  He will follow with  Dr. Clarene Duke on October 03, 2007, at 4 p.m.   DISCHARGE DIAGNOSES:  1. Unstable angina.  2. Coronary artery disease with history of stenting in his left      anterior descending in 2003, right coronary artery stenting in      March 2009, and this admission stenting to his diagonal.  3. Normal ejection fraction.  4. Non-insulin-dependent diabetes mellitus.  5. Dyslipidemia, intolerant to statins.  6. Tobacco use, does not want to quit.  7. History of gastroesophageal reflux disease, hiatal hernia, and      history of esophageal dilatation.      Lezlie Octave, N.P.    ______________________________  Thereasa Solo. Little, M.D.    BB/MEDQ  D:  09/22/2007  T:  09/23/2007  Job:  [id number]   cc:   Joe Murray, M.D.  Bruce Elvera Lennox  Juanda Chance, MD, [facility]

## 2010-07-08 NOTE — Discharge Summary (Signed)
NAMEWINN, MUEHL              ACCOUNT NO.:  [id number]   MEDICAL RECORD NO.:  [id number]          PATIENT TYPE:  INP   LOCATION:  2923                         FACILITY:  [facility]   PHYSICIAN:  Thereasa Solo. Little, M.D. DATE OF BIRTH:  02-12-1941   DATE OF ADMISSION:  09/20/2007  DATE OF DISCHARGE:  09/22/2007                               DISCHARGE SUMMARY   ADDENDUM   Dr. Juanda Chance has come to see Mr. Clute in followup after his esophagram  had showed esophageal spasm which we think that is where his chest pain  is coming from.  His barium swallow showed some tertiary contractions,  distal esophagus, esophageal spasm, and GERD.  Her plan was to give him  Protonix 40 mg every a.m. and Levsin sublingual 0.925, he can use for  chest pain.  He was told to call Dr. Juanda Chance for followup when he gets  home.      Lezlie Octave, N.P.    ______________________________  Thereasa Solo. Little, M.D.    BB/MEDQ  D:  09/22/2007  T:  09/23/2007  Job:  [id number]   cc:   Everardo Beals. Juanda Chance, MD, [facility]  Tera Mater. Clent Ridges, MD

## 2010-07-08 NOTE — Cardiovascular Report (Signed)
NAMELAJUANE, LEATHAM              ACCOUNT NO.:  [id number]   MEDICAL RECORD NO.:  [id number]          PATIENT TYPE:  INP   LOCATION:  2902                         FACILITY:  [facility]   PHYSICIAN:  Thereasa Solo. Little, M.D. DATE OF BIRTH:  June 10, 1940   DATE OF PROCEDURE:  04/27/2007  DATE OF DISCHARGE:                            CARDIAC CATHETERIZATION   This 70 year old male was admitted with postprandial angina x2 days that  awoke him from sleep.  It was not responsive to nitroglycerin but was  responsive to antacids.  His EKG was unremarkable, but his troponins  were slightly elevated.  He had had a previously placed stent to his LAD  in 2003.   After obtaining informed consent, the patient was prepped and draped in  the usual sterile fashion exposing the right groin.  Following local  anesthetic with 1% Xylocaine, the Seldinger technique was employed and a  5-French introducer sheath was placed in the right femoral artery.  Left  ventriculography was performed at the end of the case and intervention  to the distal right coronary artery was performed.   COMPLICATIONS:  None.   TOTAL CONTRAST USED:  190 mL   MEDICATIONS:  The patient was given IV Angiomax for the intervention.  His ACT prior to the intervention was 380.   RESULTS:  1. Hemodynamic monitoring.  His central aortic pressure was 119/52.      His left ventricular pressure was 125/11 and there was no      significant gradient noted at the time of pullback.  2. Ventriculography.  Ventriculography in the RAO projection using 20      mL of contrast at 12 mL per second revealed good opacification of      left ventricle, normal LV systolic function.  Ejection fraction in      excess of 55% and the left ventricular end-diastolic pressure was      14.  3. Coronary arteriography:  On fluoroscopy you could easily see a      stent in the distribution of the LAD.      a.     Left main normal.  It bifurcated.      b.      Circumflex.  The circumflex was a large codominant vessel.       It had mild proximal irregularities.  The first OM had minimal       irregularities, was a large vessel and bifurcated.  The second OM       was a medium-sized vessel free of disease and the third OM was a       relatively small vessel free of disease.      c.     LAD.  The LAD crossed the apex of the heart.  There was mild       irregularities at the proximal and midportion of the vessel.       There was a stent in the proximal LAD that had 20% irregularities       within the stent.  There was a focal 60% napkin ring-like area in  the more distal portion of the mid segment with brisk TIMI-3 flow       past.  There was a diagonal branch that came off that had ostial       60% narrowing.      d.     Right coronary artery.  The right coronary artery was a       large dominant vessel with a very large ongoing posterolateral       system.  The proximal third of the right coronary artery had       __________ and dilatation.  The most significant narrowing was 50%       but the whole segment was diffusely diseased.  The distal RCA at       the takeoff of a very small PDA had an eccentric focal area of       about 95% narrowing.  The PDA was small but would have to be       jailed if intervened upon.  The posterolateral system is clearly       the biggest, most important portion of this vessel.   CONCLUSION:  1. Diffuse disease in the right coronary artery and left anterior      descending systems with high-grade stenosis in the distal portion      of the right coronary artery.  2. Well-preserved left ventricular systolic function.   Arrangements were made for percutaneous intervention to the distal RCA.  The 5-French system was upgraded to a 6-French system and a J R-4 guide  catheter was used and a short Luge wire was used.  The Luge wire was  placed well down the distal LAD.  Narrowing was predilated with a 2 x 12  mm  long Voyager balloon.  Two inflations 12 x 30 and 10 x 50 resulted in  a lumen of about 1.5 mm.   A Cypher stent 2.75 x 13 was then placed in such a manner that both the  proximal and distal portion of the obstruction were well overlapped.  It  was initially deployed at 16 atmospheres for 45 seconds with the final  inflation being 16 atmospheres for 40 seconds.   The 95% area of narrowing pre-intervention now appeared to be normal and  in fact had a significant step-up throughout the entire stent.  Because  of this step-up, I did not feel that this needed to be postdilated.  There was no evidence of any dissection or thrombus formation.  No  evidence of any distal embolization.   The introducer sheath was sewn in place.  The Angiomax was discontinued  and the patient should be ready for discharge to home tomorrow.   He had had a nuclear study performed about six months ago that was  unremarkable and because of the other disease, particularly the focal  lesion in the LAD, I will plan to do a repeat study in six months if he  has read current angina would bring him back in and try to do a  intervention on this focal area but it does not appeared to be critical  at this point.           ______________________________  Thereasa Solo. Little, M.D.     ABL/MEDQ  D:  04/27/2007  T:  04/27/2007  Job:  [id number]   cc:   Cath Lab  Jeannett Senior A. Clent Ridges, MD

## 2010-07-08 NOTE — Cardiovascular Report (Signed)
NAMEDEWEY, VIENS              ACCOUNT NO.:  [id number]   MEDICAL RECORD NO.:  [id number]          PATIENT TYPE:  INP   LOCATION:  2923                         FACILITY:  [facility]   PHYSICIAN:  Thereasa Solo. Little, M.D. DATE OF BIRTH:  1940/03/31   DATE OF PROCEDURE:  09/21/2007  DATE OF DISCHARGE:                            CARDIAC CATHETERIZATION   INDICATIONS FOR TEST:  This 70 year old male has known coronary artery  disease.  He had a Cypher stent placed to his LAD in 2003.  He presented  with GI-type discomfort in March 2009 and had a stent placed to a  subtotal distal right coronary artery.  He had done well.  His energy  level had improved and then last night developed some discomfort in his  chest that he thought had an indigestional quality to it, although that  was his previous pain in March 2009.  It worsened throughout the evening  and he eventually was admitted to the hospital.  His cardiac markers are  unremarkable and his EKG is unchanged except he has bradycardia with  rates in the upper 40s and no rate-lowering drugs.  His normal heart  rate is in the mid 60s and the bradycardia is new.   After a long discussion with the patient, the decision was made to  proceed along with the cardiac catheterization.  I recognize he does  have an esophageal stricture that was dilated 3 years ago by [facility]  and he has had worsening problems with dysphasia, but what he describes  as last night's event does not sound like it was related to his  esophageal stricture either.   After obtaining informed consent, the patient was prepped and draped in  the usual sterile fashion exposing the right groin.  Following local  anesthetic with 1% Xylocaine, the Seldinger technique was employed and a  5-French introducer sheath was placed into the right femoral artery.  Left and right coronary arteriography, ventriculography in the artery  projection, and PCI to the proximal RCA was  performed.   COMPLICATIONS:  None.   EQUIPMENT:  5-French diagnostic catheters.  Interventional equipment as  listed below.   TOTAL CONTRAST:  130 mL.   MEDICATIONS:  1. 600 mg of p.o. Plavix.  2. 1 mg of IV Versed.  3. Angiomax IV with a preintervention ACT of 382.   RESULTS:  1. Hemodynamic monitoring:  His central aortic pressure was 110/54 and      his left ventricular pressure was 113/4.  There was no aortic valve      gradient at the time of pullback.  2. Ventriculography:  Ventriculography in the RAO projection revealed      normal LV systolic function with an ejection fraction in excess of      60%.  The left ventricular end-diastolic pressure was normal and      there were no wall motion abnormalities.  3. Coronary Arteriography:  Stents were noted in the distribution of      the distal RCA, left main, and there were some minor calcification      noted at the  aortic arch and in the LAD distribution.  4. Left main normal that bifurcated.  5. Circumflex.  The circumflex in the A-V groove was normal.  There      was a small second OM vessel that was normal.  The first OM vessel      was a large vessel that bifurcated.  In the proximal portion of OM      1 before the bifurcation, was a 30-40% area of narrowing.  6. LAD.  The LAD crossed the apex of the heart, there was a stent in      the proximal portion of the LAD that jailed the first diagonal.      The stent was widely patent.  There was minor irregularities in the      LAD.  The ostium of the first diagonal was 60-70% narrowed and this      was similar to the cath of March 2009.  7. Right coronary artery.  The right coronary artery had a tubular      stenosis in the proximal segment of about 10 mm.  There was an area      from the ostium extending down about 8 mm that appeared to be      relatively normal except for minor irregularities and then there      was a dilated segment distal to this tubular narrowing that  had      relatively smooth borders.  This tubular narrowing which was about      70% was new since March 2009.  The stent in the distal RCA was      widely patent.  The PDA which was jailed by the stent was also      widely patent.   CONCLUSION:  Patent stent in the distal right, patent stent in the LAD,  mild disease in the circumflex, moderate disease in the ostium of the  diagonal which is jailed by the LAD stent and a new 70% lesion in the  proximal portion of the RCA.   After reviewing his angiogram from 2009, the decision was made to  proceed on with intervention.  The 5-French sheath was upgraded to a 6-  [place] sheath and XBRCA guide catheter was used and a short Luge wire.  Once the ACT reached 382, we proceeded with intervention.  A Luge wire  was placed down the distal RCA without difficulty.  Primary stenting was  undertaken with 3.0 x 18 mm Cypher stent.  The guide catheter was backed  out through the ostium of the RCA and multiple pictures were obtained to  verify positioning of the stent before deployment.  Thus, the proximal  and distal segments were well covered and the stent was well within the  ostium of the RCA.  The initial inflation was 79 atmospheres for 48  seconds with a final inflation being 69 atmospheres for 42 seconds.   Post-dilatation was accomplished with a Quantum Maverick balloon.  There  was a 3.25 x 12 mm long balloon.  The balloon was placed in the distal  portion of the stent and inflated to 11 atmospheres for 35 seconds and  then positioned in the proximal portion and was re-dilated at 11  atmospheres for 35 seconds.   Following post-dilatation, the vessel appeared to be normal to a  slightly hyperexpanded.  There was no evidence of any dissection  thrombus and there was no evidence of any distal embolization.  The  patient did complain of  mild chest discomfort with inflation of the post-  dilatation balloon and this discomfort was not the same  pain that  brought him to the hospital.   His sheath will be removed once his ACT is appropriate.  I have asked  [facility] to evaluate him because of his esophageal stricture history.  He will probably be ready for discharge tomorrow.           ______________________________  Thereasa Solo Little, M.D.     ABL/MEDQ  D:  09/21/2007  T:  09/22/2007  Job:  [id number]   cc:   Jeannett Senior A. Clent Ridges, MD

## 2010-07-11 NOTE — Assessment & Plan Note (Signed)
[facility]                            [facility] OFFICE NOTE   NAME:Joe Murray, Joe Murray                     MRN:          [id number]  DATE:05/19/2006                            DOB:          07/24/1940    This 70 year old gentleman is here for complete physical examination.  He is doing well with no complaints.  He is sleeping well.  He continues  to see Dr. Clarene Duke on a regular basis for cardiology check-ups and, in  fact, is due to see Dr. Clarene Duke in a couple of weeks.  He sees Dr. Jorja Loa  for his dermatology checks.  He had an unremarkable colonoscopy in June  of 2004.   For further details of his past medical history, family history, social  history, habits, etc., I refer you to our last physical note, dated  September 24, 2003.   ALLERGIES:  Sulfa.   CURRENT MEDICATIONS:  1. Glucotrol XL 5 mg per day.  2. Aspirin 325 mg per day.  3. Multivitamins daily.  4. Zetia 10 mg daily.  5. Fish oil daily.   OBJECTIVE:  Height 5 feet 10 inches, weight 226, BP 132/78, pulse 64 and  regular.  IN GENERAL:  He appears to be doing well.  SKIN:  Clear.  EYES:  Sclera is clear.  PHARYNX:  Clear.  NECK:  Supple without lymphadenopathy or masses.  LUNGS:  Clear.  CARDIAC:  Rate and rhythm regular, without gallops, murmurs or rubs.  Distal pulses full.  ABDOMEN:  Soft, normal bowel sounds, nontender, no masses.  GENITALIA:  Normal male.  RECTAL EXAM:  No mass or tenderness.  Prostate is within normal limits.  Stool Hemoccult negative.  EXTREMITIES:  No clubbing, cyanosis or edema.  NEUROLOGIC EXAM:  Grossly intact.   ASSESSMENT AND PLAN:  1. Complete physical:  We will send him for the usual laboratories      today.  Again, we recommended stopping smoking and increasing      exercise.  We will also set him up for a screening chest x-ray, as      he continues to smoke.  2. Hyperlipidemia:  We will check a fasting lipid panel today.  3. Coronary artery  disease, per Dr. Clarene Duke.  4. Diabetes mellitus:  We will check the appropriate laboratories.  5. Insomnia, stable with over-the-counter agents.     Tera Mater. Clent Ridges, MD  Electronically Signed    SAF/MedQ  DD: 05/19/2006  DT: 05/19/2006  Job #: [id number]

## 2010-07-11 NOTE — Cardiovascular Report (Signed)
NAME:  Joe Murray, Joe Murray                        ACCOUNT NO.:  [id number]   MEDICAL RECORD NO.:  [id number]                   PATIENT TYPE:  OIB   LOCATION:  2887                                 FACILITY:  [facility]   PHYSICIAN:  Thereasa Solo. Little, M.D.              DATE OF BIRTH:  1940/06/12   DATE OF PROCEDURE:  11/23/2001  DATE OF DISCHARGE:                              CARDIAC CATHETERIZATION   INDICATIONS FOR PROCEDURE:  The patient is a 70 year old male who had  indigestion-type pain on Friday with a normal ECG.  Troponins were drawn and  came back elevated.  He has had no recurrent pain and is brought in for an  outpatient cardiac catheterization.  His ECG remains normal.   DESCRIPTION OF PROCEDURE:  The patient was prepped and draped in the usual  sterile fashion exposing the right groin. Following local anesthetic with 1%  Xylocaine, the Seldinger technique was employed and a 5 [place] introducer  sheath was placed into the right femoral artery.  Left and right coronary  arteriography and ventriculography in the RAO projection was performed.   Following the procedure, intervention to the LAD was undertaken.   EQUIPMENT:  Equipment for the diagnostic catheterization, 5 French Judkins  configuration catheters.   COMPLICATIONS:  None.   RESULTS:  1. Hemodynamic monitoring:  Central aortic pressure 122/86, left ventricular     pressure 122/9 with the left ventricular end-diastolic pressure being 17.  2. Ventriculography:  Ventriculography in the RAO projection using 25 cc of     contrast at 12 cc/sec. showed good opacification of the left ventricle.     No focal wall motion abnormality, ejection fraction greater than 60%, no     mitral regurgitation.   CORONARY ARTERIOGRAPHY:  Mild calcification in the proximal LAD.  1. Left main:  Normal.  2. LAD:  The LAD extended down and across the apex of the heart.  The first     diagonal had an area of 75% narrowing in its ostium.   Proximal to the     first diagonal was an 80% area of narrowing. There is about a 10 mm     segment where the diagonal came off but appeared relatively normal and     then there was another sequential 80% area of narrowing.  There was brisk     TIMI-3 flow.  3. Circumflex:  The circumflex is a large vessel with three OMs.  The first     OM had 40% proximal narrowing.  4. Right coronary artery:  The right coronary artery had several 20 and 30%     focal areas of narrowing.  The distal PDA and posterolateral portions     were all free of disease.   Because of the high-grade stenosis in the LAD, a decision was made to go  ahead and intervene at this time.  A 7 French sheath was placed where  the 5  [place] sheath had originally been placed.  A JL4 7 [place] guide catheter was  used, a short luge wire was then placed down the LAD.  The vessel was pre-  dilated with a 2.5 x 15 CrossSail. The proximal lesion, 10 x60 seconds, the  distal 8 x57 and 10 x63. Following angioplasty, there was still greater than  a 50-60% area of narrowing.   A 3.0 x 33 Cypher stent was then made ready and placed in such a manner that  it overlapped both the proximal and distal lesion. It was initially deployed  at 12 x 60 with a final inflation 14 x 64.  The area that was angioplastied,  which was 80% sequentially pre, appeared to be normal post. There was still  brisk distal flow.  Mild irregularities distal to the stent were noted, but  these were also seen prior to intervention and did not appear to be worsen.  Intracoronary nitroglycerin did not change their appearance.   Attention then was directed towards the ostium at the diagonal which came  off now within the stent.  A second short luge wire was used and was finally  placed down the diagonal. The original 2.5 CrossSail balloon would not cross  through the stent into the diagonal.  Because of this, a second non used 2.5  x 15 CrossSail balloon was placed into  the ostium of the diagonal and two  inflations 9 x 63 and 9 x 61 were performed. This area of 70% plus narrowing  was now less than 30% narrowed. The patient had brisk TIMI-3 flow.   He was given Angiomax prior to and during the procedure and it will be  continued for one hour postprocedure. He was given 300 mg of oral Plavix at  the end of the procedure, 1 mg of Versed and 2 mg of IV Nubain were also  given during the procedure. His ACT was monitored and consistently stayed  greater than 275.   CONCLUSION:  Successful complex angioplasty to the proximal left anterior  descending and to the ostium of the first diagonal.   TOTAL TIME:  1 hour 40 minutes.                                               Thereasa Solo. Little, M.D.    ABL/MEDQ  D:  11/23/2001  T:  11/27/2001  Job:  [id number]   cc:   Jeannett Senior A. Clent Ridges, M.D. [facility]   Cardiac Catheterization Laboratory

## 2010-07-11 NOTE — Discharge Summary (Signed)
NAME:  Joe Murray, Joe Murray              ACCOUNT NO.:  [id number]   MEDICAL RECORD NO.:  [id number]          PATIENT TYPE:  INP   LOCATION:  6523                         FACILITY:  [facility]   PHYSICIAN:  Thereasa Solo. Little, M.D. DATE OF BIRTH:  11-22-1940   DATE OF ADMISSION:  04/27/2007  DATE OF DISCHARGE:  04/29/2007                               DISCHARGE SUMMARY   DISCHARGE DIAGNOSES:  1. Non-ST elevation myocardial infarction with elevation of enzymes      related to admission and not to intervention.  2. Coronary artery disease with distal right coronary artery stenosis,      undergoing percutaneous transluminal coronary angioplasty and drug      eluting Cypher stent to the distal right coronary artery.  3. Dyslipidemia. Intolerant to Statin, though not sure all Statins.      Dr. Clarene Duke will address on visit.  4. Positive tobacco use. He has been instructed on the dangers of      continuing tobacco and he recognizes the dangers but prefers to      continue smoking.  5. Diabetes mellitus type 2.  6. History of gastroesophageal reflux disease.   CONDITION ON DISCHARGE:  Improved.   PROCEDURES:  1. April 27, 2007, combined left heart catheterization by Dr. Clarene Duke.  2. April 27, 2007, PTCA and stent deployment with a Cypher drug eluting      stent to the distal RCA for stenosis. Ejection fraction was greater      than 55%.   DISCHARGE MEDICATIONS:  1. Zetia 10 mg daily.  2. Glipizide 5 mg daily.  3. Aspirin 325 daily.  4. Alsalsa 2 tabs daily.  5. Fish oil 2 capsules daily.  6. Vitamin D daily.  7. Cinnamon 1000 mg 2 capsules daily.  8. Potassium 1 daily.  9. Multivitamin daily.  10.Plavix 75 mg daily. Do not stop, it could cause a heart attack if      stopped.  11.Prilosec 20 mg daily.  12.Altace 1.25 mg daily.  Again, patient refused Statin.   DISCHARGE INSTRUCTIONS:  1. Increase activity slowly. May shower but no tub baths for 7 days.  2. No lifting for 2 days. No driving  for 2 days.  3. Will wash right groin catheterization site with soap and water.      Call if any bleeding, swelling, or drainage.  4. Followup with Dr. Clarene Duke on May 16, 2007 at 11:30 a.m.   HISTORY OF PRESENT ILLNESS:  A 70 year old male patient of Dr. Fredirick Maudlin,  presented to the emergency room on April 27, 2007 complaining of burning  substernal chest pain after dinner, relieved with Rolaids. Then the next  night, chest pain reoccurred after dinner but not relieved with Rolaids.  Tried to sleep but could not. Brought to the emergency room. He stated  the pain was similar to his previous MI. Was associated with shortness  of breath but no radiation, nausea, vomiting, or diaphoresis. No relief  after 3 sublingual nitroglycerin. Difficult to assess, as this could be  exertional, as well as he is unable to exert much due to hip pain.  Previously, he has a history of coronary disease with a non-ST elevation  MI in 2003 with stent to the LAD. He has had angioplasty to his  diagonal. He also has diabetes mellitus type 2, hypertension, and  hypercholesterolemia with intolerance to Statins. I do not believe he  has tried Pravachol. He also has gastroesophageal reflux disease.   ALLERGIES:  SULFA, STATIN.   FAMILY HISTORY/SOCIAL HISTORY/REVIEW OF SYSTEMS:  See H&P.   PHYSICAL EXAMINATION:  VITAL SIGNS:  At discharge, blood pressure  103/40, pulse 55, respiratory rate 18, temperature 97.5, oxygen  saturation on room air 96%.  HEART:  Regular rate and rhythm.  LUNGS:  Clear.  ABDOMEN:  Positive bowel sounds.  EXTREMITIES:  Without edema.   LABORATORY DATA:  Hemoglobin and hematocrit 15.3 and 44.9. White blood  cell count 8.9. Platelets 202,000. Neutrophils 51, lymphs 36, mono's 10,  eosinophils 3, basophils 1. Pro-time 14.5, INR of 1.1. On heparin, he  was therapeutic. Sodi8um 137, potassium 4.1, chloride 106, CO2 27,  glucose 79, BUN 15, creatinine 0.99. Cardiac enzymes, initial CK 133   with MB of 2.5 and troponin I of 0.08, which followed from 425 to 1230  and his catheterization was about 12:00 o'clock. CK was 172 with an MB  of 16.2, relative index 9.4, troponin I had bumped to 3.04 and by  followup, CK is 211 and 181 with MB of 15 and 14 and troponin 3.01. They  gradually came down and by April 28, 2007 at 11:35 a.m., CK 114, MB 5.2,  and troponin 1.17. Total cholesterol 178, triglycerides 208. HDL 31, LDL  105. TSH 1.770.   EKG on admission, sinus brady with rate of 54, left axis deviation and  complete right bundle branch block. Post-procedure EKG, I do not find. A  2-D echo was done with overall LV systolic function normal. Ejection  fraction of 55%. There was mild mitral regurgitation, right ventricle  was mildly dilated. Estimated peak pulmonary artery pressure is 3.   HOSPITAL COURSE:  The patient was admitted with unstable angina and  actually a non-ST elevation MI. He underwent cardiac catheterization and  was found to have RCA distal stenosis. Underwent PTCA with a drug  eluting stent and tolerated the procedure. Due to the enzyme bump and  the non-ST MI, he was kept an extra day in the  hospital. By the next morning, he was stable. No arrhythmias. No  complaints. Ready for discharge. Again, we requested he start a Statin,  or at least try Pravachol and he refused. Tobacco cessation. He was seen  in and discharged.      Darcella Gasman. [place], N.P.    ______________________________  Thereasa Solo Little, M.D.    LRI/MEDQ  D:  05/03/2007  T:  05/04/2007  Job:  [id number]   cc:   Jeannett Senior A. Clent Ridges, MD

## 2010-09-18 ENCOUNTER — Telehealth: Payer: Self-pay | Admitting: *Deleted

## 2010-09-18 DIAGNOSIS — N401 Enlarged prostate with lower urinary tract symptoms: Secondary | ICD-10-CM

## 2010-09-18 DIAGNOSIS — E119 Type 2 diabetes mellitus without complications: Secondary | ICD-10-CM

## 2010-09-18 DIAGNOSIS — Z136 Encounter for screening for cardiovascular disorders: Secondary | ICD-10-CM

## 2010-09-18 DIAGNOSIS — R413 Other amnesia: Secondary | ICD-10-CM

## 2010-09-18 DIAGNOSIS — N138 Other obstructive and reflux uropathy: Secondary | ICD-10-CM

## 2010-09-18 DIAGNOSIS — I2510 Atherosclerotic heart disease of native coronary artery without angina pectoris: Secondary | ICD-10-CM

## 2010-09-18 MED ORDER — DONEPEZIL HCL 10 MG PO TABS: 10.0000 mg | ORAL_TABLET | ORAL | Status: DC

## 2010-09-18 NOTE — Telephone Encounter (Signed)
Pt likes the Aricept 10 mg, but is having some diarrhea, and wants to cut back to 5 mg.  Also, it makes him feel lethargic.

## 2010-09-18 NOTE — Telephone Encounter (Signed)
Tell him to take 1/2 a tablet daily

## 2010-09-18 NOTE — Telephone Encounter (Signed)
Pt notified to take 1/2 tablet of Aricept daily.

## 2010-10-13 ENCOUNTER — Encounter: Payer: Self-pay | Admitting: Family Medicine

## 2010-10-13 ENCOUNTER — Ambulatory Visit (INDEPENDENT_AMBULATORY_CARE_PROVIDER_SITE_OTHER): Payer: Medicare Other | Admitting: Family Medicine

## 2010-10-13 VITALS — BP 120/70 | HR 78 | Temp 98.6°F | Wt 229.0 lb

## 2010-10-13 DIAGNOSIS — W57XXXA Bitten or stung by nonvenomous insect and other nonvenomous arthropods, initial encounter: Secondary | ICD-10-CM

## 2010-10-13 DIAGNOSIS — R413 Other amnesia: Secondary | ICD-10-CM

## 2010-10-13 DIAGNOSIS — T148XXA Other injury of unspecified body region, initial encounter: Secondary | ICD-10-CM

## 2010-10-13 MED ORDER — DONEPEZIL HCL 5 MG PO TABS: 5.0000 mg | ORAL_TABLET | Freq: Every day | ORAL | Status: DC

## 2010-10-13 MED ORDER — DOXYCYCLINE HYCLATE 100 MG PO CAPS: 100.0000 mg | ORAL_CAPSULE | Freq: Two times a day (BID) | ORAL | Status: AC

## 2010-10-13 NOTE — Progress Notes (Signed)
  Subjective:    Patient ID: Joe Murray, male    DOB: 1941-02-22, 70 y.o.   MRN: [id number]  HPI Here asking about several weeks of hot spells and chills without a measurable fever, HAs and body aches. No NVD or rashes. He pulled a tick off his back about a month ago. Also he has been breaking his Aricept in half, and he likes thi slower dose better.    Review of Systems  Constitutional: Positive for chills. Negative for fever.  HENT: Negative.   Eyes: Negative.   Respiratory: Negative.   Cardiovascular: Negative.   Musculoskeletal: Positive for myalgias.  Psychiatric/Behavioral: Negative.        Objective:   Physical Exam  Constitutional: He appears well-developed and well-nourished.  Neck: No thyromegaly present.  Cardiovascular: Normal rate, regular rhythm, normal heart sounds and intact distal pulses.   Pulmonary/Chest: Effort normal and breath sounds normal.  Lymphadenopathy:    He has no cervical adenopathy.  Skin: Skin is warm and dry. No rash noted. No erythema. No pallor.       Tiny red spot on the right upper back           Assessment & Plan:  Treat with Doxycyline to cover any tick borne illnesses, and decrease the dose of Aricept to 5 mg a day.

## 2010-10-29 ENCOUNTER — Other Ambulatory Visit: Payer: Self-pay | Admitting: Family Medicine

## 2010-11-17 LAB — CBC
Hemoglobin: 13.6
MCHC: 34.3
MCHC: 34.5
MCV: 93.5
MCV: 93.8
Platelets: 168
RBC: 4.77
RDW: 12.1
RDW: 12.4
WBC: 8.9

## 2010-11-17 LAB — BASIC METABOLIC PANEL
BUN: 14
CO2: 28
GFR calc non Af Amer: >60
Glucose, Bld: 79
Glucose, Bld: 98
Potassium: 4.3
Potassium: 4.6
Sodium: 139

## 2010-11-17 LAB — CARDIAC PANEL(CRET KIN+CKTOT+MB+TROPI)
CK, MB: 5.2 — ABNORMAL HIGH
CK, MB: 8.8 — ABNORMAL HIGH
Relative Index: 6.1 — ABNORMAL HIGH
Total CK: 114
Total CK: 145
Total CK: 181
Troponin I: 3.01
Troponin I: 3.04

## 2010-11-17 LAB — I-STAT 8, (EC8 V) (CONVERTED LAB)
Chloride: 106
HCT: 47
Hemoglobin: 16
Operator id: 277751
Potassium: 4.1
pCO2, Ven: 43.4 — ABNORMAL LOW

## 2010-11-17 LAB — DIFFERENTIAL
Basophils Relative: 1
Lymphocytes Relative: 36
Lymphs Abs: 3.2
Monocytes Relative: 10
Neutro Abs: 4.6
Neutrophils Relative %: 51

## 2010-11-17 LAB — POCT CARDIAC MARKERS: CKMB, poc: <1 — ABNORMAL LOW

## 2010-11-17 LAB — CK TOTAL AND CKMB (NOT AT ARMC)
CK, MB: 15.1 — ABNORMAL HIGH
Relative Index: 7.2 — ABNORMAL HIGH
Total CK: 211

## 2010-11-17 LAB — HEPARIN LEVEL (UNFRACTIONATED): Heparin Unfractionated: 1.13 — ABNORMAL HIGH

## 2010-11-17 LAB — TROPONIN I: Troponin I: 0.08 — ABNORMAL HIGH

## 2010-11-17 LAB — PROTIME-INR
INR: 1.1
Prothrombin Time: 14.5

## 2010-11-17 LAB — LIPID PANEL: Cholesterol: 178

## 2010-11-17 LAB — POCT I-STAT CREATININE: Creatinine, Ser: 1.3

## 2010-11-21 LAB — POCT I-STAT, CHEM 8
BUN: 16
Calcium, Ion: 1.09 — ABNORMAL LOW
Chloride: 105
Creatinine, Ser: 1.2
Glucose, Bld: 135 — ABNORMAL HIGH
HCT: 41
Potassium: 3.8

## 2010-11-21 LAB — COMPREHENSIVE METABOLIC PANEL
ALT: 19
Albumin: 3.3 — ABNORMAL LOW
Alkaline Phosphatase: 45
BUN: 16
Calcium: 8.4
Potassium: 3.6
Sodium: 135
Total Protein: 5.7 — ABNORMAL LOW

## 2010-11-21 LAB — POCT CARDIAC MARKERS
CKMB, poc: <1 — ABNORMAL LOW
Troponin i, poc: <0.05

## 2010-11-21 LAB — CBC
HCT: 38.9 — ABNORMAL LOW
Hemoglobin: 13.1
MCHC: 33.7
MCHC: 33.7
MCHC: 35.2
MCV: 92.8
MCV: 94.3
Platelets: 181
RBC: 4.13 — ABNORMAL LOW
RBC: 4.19 — ABNORMAL LOW
RBC: 4.42
RDW: 12.4
WBC: 7
WBC: 9.6

## 2010-11-21 LAB — PROTIME-INR
INR: 1
Prothrombin Time: 13
Prothrombin Time: 13.9

## 2010-11-21 LAB — H. PYLORI ANTIBODY, IGG: H Pylori IgG: 6.5 — ABNORMAL HIGH

## 2010-11-21 LAB — BASIC METABOLIC PANEL
CO2: 25
Calcium: 8.5
Creatinine, Ser: 1.08
GFR calc Af Amer: >60
GFR calc non Af Amer: >60
Sodium: 135

## 2010-11-21 LAB — CARDIAC PANEL(CRET KIN+CKTOT+MB+TROPI)
CK, MB: 1.4
Relative Index: INVALID
Relative Index: INVALID
Total CK: 73
Total CK: 83
Troponin I: 0.05
Troponin I: 0.08 — ABNORMAL HIGH

## 2010-11-21 LAB — LIPID PANEL
Cholesterol: 178
LDL Cholesterol: 104 — ABNORMAL HIGH
Total CHOL/HDL Ratio: 5.1

## 2010-11-21 LAB — MAGNESIUM: Magnesium: 2.1

## 2010-11-21 LAB — TSH: TSH: 1.35

## 2010-11-21 LAB — CK TOTAL AND CKMB (NOT AT ARMC)
CK, MB: 1.3
Relative Index: 1.1
Total CK: 116

## 2010-11-24 LAB — GLUCOSE, CAPILLARY: Glucose-Capillary: 193 — ABNORMAL HIGH

## 2010-11-25 LAB — GLUCOSE, CAPILLARY: Glucose-Capillary: 119 — ABNORMAL HIGH

## 2010-11-26 LAB — GLUCOSE, CAPILLARY: Glucose-Capillary: 133 — ABNORMAL HIGH

## 2010-11-27 ENCOUNTER — Encounter: Payer: Self-pay | Admitting: Family Medicine

## 2010-11-27 ENCOUNTER — Ambulatory Visit (INDEPENDENT_AMBULATORY_CARE_PROVIDER_SITE_OTHER): Payer: Medicare Other | Admitting: Family Medicine

## 2010-11-27 ENCOUNTER — Telehealth: Payer: Self-pay | Admitting: *Deleted

## 2010-11-27 VITALS — BP 110/78 | Temp 97.8°F | Wt 229.0 lb

## 2010-11-27 DIAGNOSIS — R5381 Other malaise: Secondary | ICD-10-CM

## 2010-11-27 DIAGNOSIS — R519 Headache, unspecified: Secondary | ICD-10-CM

## 2010-11-27 DIAGNOSIS — Z23 Encounter for immunization: Secondary | ICD-10-CM

## 2010-11-27 DIAGNOSIS — R51 Headache: Secondary | ICD-10-CM

## 2010-11-27 DIAGNOSIS — R5383 Other fatigue: Secondary | ICD-10-CM

## 2010-11-27 MED ORDER — CYCLOBENZAPRINE HCL 5 MG PO TABS: ORAL_TABLET | ORAL | Status: DC

## 2010-11-27 NOTE — Telephone Encounter (Signed)
Pt's wife called stating that Pt has had continuous headache x 3 days, and basically feeling ill.  Dr. Clent Ridges treated him for tick borne disease, but he seems to be getting worse.  Scheduled appt with Dr. Caryl Never this afternoon.  Wife is not sure about fever, chills or any other symptoms, and pt is asleep.

## 2010-11-27 NOTE — Progress Notes (Signed)
Subjective:    Patient ID: Joe Murray, male    DOB: 24-Jan-1941, 70 y.o.   MRN: [id number]  HPI Patient worked in with headache. He's had headache off and on since summer. Not progressive and more intermittent.  He gives history of multiple tick bites over the summer. Treated empirically doxycycline back in August. He never had rash. Did have some fever briefly in June but none since then. Headache is mostly right occipital and occasionally diffuse occipital. Achy quality. 5/10 severity. No nausea or vomiting. No focal weakness. Improved with Advil. No exacerbating features. No focal weakness. No visual changes. Patient has history of some memory loss and takes low-dose Aricept. Had MRI of brain June 2011 which showed mild atrophy.  Patient has not had any symptoms of sinusitis. No nasal discharge. No skin rashes. Chronic problems are type 2 diabetes, hyperlipidemia, CAD. No recent injury. Increased malaise over the past several weeks. Had multiple labs last spring including thyroid functions which were normal. Diabetes well controlled.  Past Medical History  Diagnosis Date  . Diabetes mellitus   . CAD (coronary artery disease)     sees Dr. Julieanne Manson  . MI (myocardial infarction) 2009  . SVT (supraventricular tachycardia)   . Insomnia   . Psoriasis     sees Dr. Jorja Loa  . Hyperlipidemia   . GERD (gastroesophageal reflux disease)   . ED (erectile dysfunction)    Past Surgical History  Procedure Date  . Shoulder surgery     left rotator cuff, Dr. Darrelyn Hillock  . Wrist fracture surgery     right  . Coronary angioplasty with stent placement     one in LAD 2003, two in RCA 2003  . Esophagogastroduodenoscopy 08-11-02    esophageal dilation per Dr. Leone Payor  . Colonoscopy 08-11-02    clear, per Dr. Leone Payor, repeat in 10 yrs  . Cataract extraction   . Hernia repair     right inguinal     reports that he has been smoking Cigars.  He has never used smokeless tobacco. He reports that he  drinks alcohol. He reports that he does not use illicit drugs. family history includes Dementia in his father; Diabetes in his mother; and Hearing loss in his father. Allergies  Allergen Reactions  . Statins     REACTION: myalgias  . Sulfonamide Derivatives       Review of Systems  Constitutional: Positive for fatigue. Negative for fever, chills, appetite change and unexpected weight change.  HENT: Negative for hearing loss, sore throat, trouble swallowing, sinus pressure and tinnitus.   Eyes: Negative for visual disturbance.  Respiratory: Negative for cough and shortness of breath.   Cardiovascular: Negative for chest pain.  Gastrointestinal: Negative for abdominal pain.  Neurological: Positive for headaches. Negative for dizziness, seizures, syncope and weakness.  Hematological: Negative for adenopathy. Does not bruise/bleed easily.  Psychiatric/Behavioral: Negative for dysphoric mood.       Objective:   Physical Exam  Constitutional: He is oriented to person, place, and time. He appears well-developed and well-nourished. No distress.  HENT:  Right Ear: External ear normal.  Left Ear: External ear normal.  Mouth/Throat: Oropharynx is clear and moist.  Neck: Neck supple. No thyromegaly present.  Cardiovascular: Normal rate, regular rhythm and normal heart sounds.   Pulmonary/Chest: Effort normal and breath sounds normal. No respiratory distress. He has no wheezes. He has no rales.  Musculoskeletal: He exhibits no edema.  Lymphadenopathy:    He has no cervical adenopathy.  Neurological:  He is alert and oriented to person, place, and time. He has normal reflexes. No cranial nerve deficit. Coordination normal.       Gait normal. No focal weakness.  Skin: No rash noted.  Psychiatric: He has a normal mood and affect.          Assessment & Plan:  #1 persistent headache. Doubt related to recent tick bites over the summer. Family insisting on Lyme antibody testing and we  explained his risk would seem extremely low given low geographic prevalence and lack of skin rash. Also patient was treated with appropriate antibiotic which was doxycycline. Question musculoskeletal. Try low-dose cyclobenzaprine 5 mg each bedtime and review possible side effects. Followup 2 weeks with Dr. Clent Ridges.  Check sed rate.  No evidence to suggest temporal arteritis. #2 increased malaise.  screening lab work.

## 2010-11-27 NOTE — Patient Instructions (Signed)
Schedule follow up with Dr Clent Ridges Use some heat to neck and upper back pain.

## 2010-11-28 LAB — BASIC METABOLIC PANEL
BUN: 18 mg/dL (ref 6–23)
Chloride: 105 mEq/L (ref 96–112)
Creatinine, Ser: 1.4 mg/dL (ref 0.4–1.5)
Glucose, Bld: 160 mg/dL — ABNORMAL HIGH (ref 70–99)
Potassium: 4.4 mEq/L (ref 3.5–5.1)

## 2010-11-28 LAB — CBC WITH DIFFERENTIAL/PLATELET
Basophils Relative: 1.2 % (ref 0.0–3.0)
Eosinophils Relative: 2.3 % (ref 0.0–5.0)
Hemoglobin: 13.4 g/dL (ref 13.0–17.0)
Lymphocytes Relative: 24.7 % (ref 12.0–46.0)
MCV: 90.6 fl (ref 78.0–100.0)
Monocytes Absolute: 0.7 10*3/uL (ref 0.1–1.0)
Neutro Abs: 4.5 10*3/uL (ref 1.4–7.7)
Neutrophils Relative %: 62.7 % (ref 43.0–77.0)
RBC: 4.43 Mil/uL (ref 4.22–5.81)
WBC: 7.1 10*3/uL (ref 4.5–10.5)

## 2010-11-28 NOTE — Telephone Encounter (Signed)
noted 

## 2010-11-28 NOTE — Progress Notes (Signed)
Quick Note:  Pt wife informed ______ 

## 2010-12-02 NOTE — Progress Notes (Signed)
Quick Note:  Pt informed on home VM ______ 

## 2010-12-10 ENCOUNTER — Ambulatory Visit: Payer: Medicare Other | Admitting: Family Medicine

## 2011-02-27 ENCOUNTER — Telehealth: Payer: Self-pay | Admitting: Family Medicine

## 2011-02-27 MED ORDER — AZITHROMYCIN 250 MG PO TABS: ORAL_TABLET | ORAL | Status: AC

## 2011-02-27 NOTE — Telephone Encounter (Signed)
Cough, congestion since the day after christmas.  Nyquil has been little relief.  Pt wants a written a rx for an antibiotic.  He also wants a written rx for zostavax to take the pharmacy.  Please notify pt

## 2011-02-27 NOTE — Telephone Encounter (Signed)
Pt has had a cold for a few days and is experiencing a bad cough and said when he coughs he has pain in his stomach. Pt would like to be seen today. Please contact

## 2011-02-27 NOTE — Telephone Encounter (Signed)
Pt came by office to check on status of getting script for Zostavax. Pt has been informed that Dr Clent Ridges is calling in a zpak and he will worry about the Zostovax when pt gets better. Call in to [facility] on [address] asap today.   Pt says that he is sch for cataract on 03/08/10 and wants to know if his meds will effect his surgery? Pls advsise.

## 2011-02-27 NOTE — Telephone Encounter (Signed)
I sent z-pack e-scribe and pt aware.

## 2011-02-27 NOTE — Telephone Encounter (Signed)
Call in a Zpack. We can worry about the Zostavax after he gets better

## 2011-06-25 ENCOUNTER — Encounter: Payer: Self-pay | Admitting: Family Medicine

## 2011-06-25 ENCOUNTER — Ambulatory Visit (INDEPENDENT_AMBULATORY_CARE_PROVIDER_SITE_OTHER): Payer: Medicare Other | Admitting: Family Medicine

## 2011-06-25 VITALS — BP 112/74 | HR 56 | Temp 97.8°F | Ht 68.25 in | Wt 222.0 lb

## 2011-06-25 DIAGNOSIS — N401 Enlarged prostate with lower urinary tract symptoms: Secondary | ICD-10-CM

## 2011-06-25 DIAGNOSIS — Z136 Encounter for screening for cardiovascular disorders: Secondary | ICD-10-CM

## 2011-06-25 DIAGNOSIS — IMO0001 Reserved for inherently not codable concepts without codable children: Secondary | ICD-10-CM

## 2011-06-25 DIAGNOSIS — N139 Obstructive and reflux uropathy, unspecified: Secondary | ICD-10-CM

## 2011-06-25 DIAGNOSIS — N138 Other obstructive and reflux uropathy: Secondary | ICD-10-CM

## 2011-06-25 DIAGNOSIS — E119 Type 2 diabetes mellitus without complications: Secondary | ICD-10-CM

## 2011-06-25 DIAGNOSIS — E559 Vitamin D deficiency, unspecified: Secondary | ICD-10-CM

## 2011-06-25 DIAGNOSIS — R413 Other amnesia: Secondary | ICD-10-CM

## 2011-06-25 DIAGNOSIS — I2510 Atherosclerotic heart disease of native coronary artery without angina pectoris: Secondary | ICD-10-CM

## 2011-06-25 DIAGNOSIS — I10 Essential (primary) hypertension: Secondary | ICD-10-CM

## 2011-06-25 LAB — CBC WITH DIFFERENTIAL/PLATELET
Basophils Absolute: 0 10*3/uL (ref 0.0–0.1)
Eosinophils Absolute: 0.2 10*3/uL (ref 0.0–0.7)
Hemoglobin: 13.2 g/dL (ref 13.0–17.0)
Lymphocytes Relative: 25.9 % (ref 12.0–46.0)
MCHC: 33.5 g/dL (ref 30.0–36.0)
MCV: 89.8 fl (ref 78.0–100.0)
Monocytes Absolute: 0.5 10*3/uL (ref 0.1–1.0)
Neutro Abs: 4.2 10*3/uL (ref 1.4–7.7)
RDW: 13.9 % (ref 11.5–14.6)

## 2011-06-25 LAB — BASIC METABOLIC PANEL
Chloride: 102 mEq/L (ref 96–112)
Creatinine, Ser: 1 mg/dL (ref 0.4–1.5)
GFR: 75.58 mL/min (ref >60.00)
Potassium: 4.8 mEq/L (ref 3.5–5.1)
Sodium: 135 mEq/L (ref 135–145)

## 2011-06-25 LAB — PSA: PSA: 1.13 ng/mL (ref 0.10–4.00)

## 2011-06-25 LAB — HEPATIC FUNCTION PANEL
AST: 20 U/L (ref 0–37)
Alkaline Phosphatase: 72 U/L (ref 39–117)
Bilirubin, Direct: 0.1 mg/dL (ref 0.0–0.3)
Total Bilirubin: 0.5 mg/dL (ref 0.3–1.2)

## 2011-06-25 LAB — LIPID PANEL
Total CHOL/HDL Ratio: 4
VLDL: 32 mg/dL (ref 0.0–40.0)

## 2011-06-25 LAB — POCT URINALYSIS DIPSTICK
Bilirubin, UA: NEGATIVE
Ketones, UA: NEGATIVE
Leukocytes, UA: NEGATIVE
Spec Grav, UA: 1.015
pH, UA: 6

## 2011-06-25 LAB — LDL CHOLESTEROL, DIRECT: Direct LDL: 152.3 mg/dL

## 2011-06-25 LAB — MICROALBUMIN / CREATININE URINE RATIO: Microalb, Ur: 0.6 mg/dL (ref 0.0–1.9)

## 2011-06-25 MED ORDER — POTASSIUM CHLORIDE ER 10 MEQ PO TBCR: 10.0000 meq | EXTENDED_RELEASE_TABLET | Freq: Every day | ORAL | Status: AC

## 2011-06-25 MED ORDER — VITAMIN D3 125 MCG (5000 UT) PO CAPS: 5000.0000 [IU] | ORAL_CAPSULE | Freq: Every day | ORAL | Status: DC

## 2011-06-25 MED ORDER — OMEPRAZOLE 20 MG PO CPDR: 20.0000 mg | DELAYED_RELEASE_CAPSULE | Freq: Every day | ORAL | Status: DC

## 2011-06-25 MED ORDER — CYCLOBENZAPRINE HCL 5 MG PO TABS: ORAL_TABLET | ORAL | Status: DC

## 2011-06-25 MED ORDER — GLIPIZIDE 5 MG PO TABS: 5.0000 mg | ORAL_TABLET | Freq: Two times a day (BID) | ORAL | Status: DC

## 2011-06-25 MED ORDER — CLOPIDOGREL BISULFATE 75 MG PO TABS: 75.0000 mg | ORAL_TABLET | Freq: Every day | ORAL | Status: DC

## 2011-06-25 NOTE — Progress Notes (Signed)
  Subjective:    Patient ID: Joe Murray, male    DOB: [birth year], 71 y.o.   MRN: [id number]  HPI 71 yr old male for a cpx. He feels fine and has no concerns. We tried him on Aricept last year for memory issues, and in fact he says it helped quite a bit. However he started getting frequent headaches, and he figured these were coming from the Aricept. He tried cutting the dose down to 5 mg a day, but the HAs persisted. Then when he stopped it completely last fall, the HAs finally went away. He has stayed off Aricept ever since. His memory is still a bit of a problem but it is better than it used to be.    Review of Systems  Constitutional: Negative.   HENT: Negative.   Eyes: Negative.   Respiratory: Negative.   Cardiovascular: Negative.   Gastrointestinal: Negative.   Genitourinary: Negative.   Musculoskeletal: Negative.   Skin: Negative.   Neurological: Negative.   Hematological: Negative.   Psychiatric/Behavioral: Negative.        Objective:   Physical Exam  Constitutional: He is oriented to person, place, and time. He appears well-developed and well-nourished. No distress.  HENT:  Head: Normocephalic and atraumatic.  Right Ear: External ear normal.  Left Ear: External ear normal.  Nose: Nose normal.  Mouth/Throat: Oropharynx is clear and moist. No oropharyngeal exudate.  Eyes: Conjunctivae and EOM are normal. Pupils are equal, round, and reactive to light. Right eye exhibits no discharge. Left eye exhibits no discharge. No scleral icterus.  Neck: Neck supple. No JVD present. No tracheal deviation present. No thyromegaly present.  Cardiovascular: Normal rate, regular rhythm, normal heart sounds and intact distal pulses.  Exam reveals no gallop and no friction rub.   No murmur heard. Pulmonary/Chest: Effort normal and breath sounds normal. No respiratory distress. He has no wheezes. He has no rales. He exhibits no tenderness.  Abdominal: Soft. Bowel sounds are normal. He  exhibits no distension and no mass. There is no tenderness. There is no rebound and no guarding.  Genitourinary: Rectum normal, prostate normal and penis normal. Guaiac negative stool. No penile tenderness.  Musculoskeletal: Normal range of motion. He exhibits no edema and no tenderness.  Lymphadenopathy:    He has no cervical adenopathy.  Neurological: He is alert and oriented to person, place, and time. He has normal reflexes. No cranial nerve deficit. He exhibits normal muscle tone. Coordination normal.  Skin: Skin is warm and dry. No rash noted. He is not diaphoretic. No erythema. No pallor.  Psychiatric: He has a normal mood and affect. His behavior is normal. Judgment and thought content normal.          Assessment & Plan:  Well exam. Get fasting labs. He is due to see Dr. Clarene Duke in 2 weeks as well

## 2011-06-25 NOTE — ED Provider Notes (Signed)
Order(s) created erroneously. Erroneous order ID: [id number] Order moved by: GRAVES, Meridian Scherger Order move date/time: 06/25/2011 10:28 AM Source Patient:     [id number] Source Contact: 06/25/2011 Destination Patient:   [id number] Destination Contact: 06/17/2011

## 2011-06-29 ENCOUNTER — Encounter: Payer: Self-pay | Admitting: Family Medicine

## 2011-06-29 NOTE — Progress Notes (Signed)
Quick Note:  I spoke to pt, put a copy of results in mail and also faxed a copy to Dr. Julieanne Manson ( fax # [phone] ) ______

## 2011-07-31 ENCOUNTER — Encounter: Payer: Self-pay | Admitting: Family Medicine

## 2011-12-25 DIAGNOSIS — I4589 Other specified conduction disorders: Secondary | ICD-10-CM | POA: Insufficient documentation

## 2011-12-25 HISTORY — DX: Other specified conduction disorders: I45.89

## 2012-01-12 DIAGNOSIS — R0602 Shortness of breath: Secondary | ICD-10-CM | POA: Insufficient documentation

## 2012-03-30 ENCOUNTER — Other Ambulatory Visit: Payer: Self-pay | Admitting: Family Medicine

## 2012-06-27 ENCOUNTER — Ambulatory Visit (INDEPENDENT_AMBULATORY_CARE_PROVIDER_SITE_OTHER): Payer: Medicare Other | Admitting: Family Medicine

## 2012-06-27 ENCOUNTER — Encounter: Payer: Self-pay | Admitting: Family Medicine

## 2012-06-27 VITALS — BP 128/80 | HR 53 | Temp 98.0°F | Ht 69.5 in | Wt 224.0 lb

## 2012-06-27 DIAGNOSIS — Z0000 Encounter for general adult medical examination without abnormal findings: Secondary | ICD-10-CM

## 2012-06-27 DIAGNOSIS — N401 Enlarged prostate with lower urinary tract symptoms: Secondary | ICD-10-CM

## 2012-06-27 DIAGNOSIS — I2510 Atherosclerotic heart disease of native coronary artery without angina pectoris: Secondary | ICD-10-CM

## 2012-06-27 DIAGNOSIS — Z136 Encounter for screening for cardiovascular disorders: Secondary | ICD-10-CM

## 2012-06-27 DIAGNOSIS — E569 Vitamin deficiency, unspecified: Secondary | ICD-10-CM

## 2012-06-27 DIAGNOSIS — R413 Other amnesia: Secondary | ICD-10-CM

## 2012-06-27 DIAGNOSIS — E119 Type 2 diabetes mellitus without complications: Secondary | ICD-10-CM

## 2012-06-27 DIAGNOSIS — E559 Vitamin D deficiency, unspecified: Secondary | ICD-10-CM

## 2012-06-27 DIAGNOSIS — N138 Other obstructive and reflux uropathy: Secondary | ICD-10-CM

## 2012-06-27 DIAGNOSIS — N139 Obstructive and reflux uropathy, unspecified: Secondary | ICD-10-CM

## 2012-06-27 LAB — CBC WITH DIFFERENTIAL/PLATELET
Basophils Absolute: 0 10*3/uL (ref 0.0–0.1)
Basophils Relative: 0.7 % (ref 0.0–3.0)
Eosinophils Relative: 2.9 % (ref 0.0–5.0)
HCT: 40.1 % (ref 39.0–52.0)
Hemoglobin: 13.7 g/dL (ref 13.0–17.0)
Lymphocytes Relative: 23 % (ref 12.0–46.0)
Lymphs Abs: 1.5 10*3/uL (ref 0.7–4.0)
Monocytes Relative: 8.6 % (ref 3.0–12.0)
Neutro Abs: 4.3 10*3/uL (ref 1.4–7.7)
RBC: 4.41 Mil/uL (ref 4.22–5.81)
RDW: 13.6 % (ref 11.5–14.6)
WBC: 6.7 10*3/uL (ref 4.5–10.5)

## 2012-06-27 LAB — HEPATIC FUNCTION PANEL
ALT: 12 U/L (ref 0–53)
Bilirubin, Direct: 0.1 mg/dL (ref 0.0–0.3)
Total Bilirubin: 0.8 mg/dL (ref 0.3–1.2)

## 2012-06-27 LAB — BASIC METABOLIC PANEL
BUN: 13 mg/dL (ref 6–23)
Calcium: 8.8 mg/dL (ref 8.4–10.5)
Chloride: 100 mEq/L (ref 96–112)
Creatinine, Ser: 1 mg/dL (ref 0.4–1.5)
GFR: 78.89 mL/min (ref >60.00)

## 2012-06-27 LAB — POCT URINALYSIS DIPSTICK
Bilirubin, UA: NEGATIVE
Glucose, UA: NEGATIVE
Ketones, UA: NEGATIVE
Leukocytes, UA: NEGATIVE
Spec Grav, UA: 1.015

## 2012-06-27 LAB — MICROALBUMIN / CREATININE URINE RATIO
Creatinine,U: 79 mg/dL
Microalb Creat Ratio: 0.9 mg/g (ref 0.0–30.0)

## 2012-06-27 LAB — LIPID PANEL
Cholesterol: 253 mg/dL — ABNORMAL HIGH (ref 0–200)
HDL: 132.8 mg/dL (ref >39.00)
Total CHOL/HDL Ratio: 2
Triglycerides: 112 mg/dL (ref 0.0–149.0)
VLDL: 22.4 mg/dL (ref 0.0–40.0)

## 2012-06-27 LAB — TSH: TSH: 1.32 u[IU]/mL (ref 0.35–5.50)

## 2012-06-27 LAB — PSA: PSA: 1.22 ng/mL (ref 0.10–4.00)

## 2012-06-27 MED ORDER — OMEPRAZOLE 20 MG PO CPDR: 20.0000 mg | DELAYED_RELEASE_CAPSULE | Freq: Every day | ORAL | Status: DC

## 2012-06-27 MED ORDER — GLIPIZIDE 5 MG PO TABS: 5.0000 mg | ORAL_TABLET | Freq: Two times a day (BID) | ORAL | Status: DC

## 2012-06-27 NOTE — Progress Notes (Signed)
  Subjective:    Patient ID: Joe Murray, male    DOB: 1940/04/21, 72 y.o.   MRN: [id number]  HPI 72 yr old male for a cpx. He feels well and has no concerns.    Review of Systems  Constitutional: Negative.   HENT: Negative.   Eyes: Negative.   Respiratory: Negative.   Cardiovascular: Negative.   Gastrointestinal: Negative.   Genitourinary: Negative.   Musculoskeletal: Negative.   Skin: Negative.   Neurological: Negative.   Psychiatric/Behavioral: Negative.        Objective:   Physical Exam  Constitutional: He is oriented to person, place, and time. He appears well-developed and well-nourished. No distress.  HENT:  Head: Normocephalic and atraumatic.  Right Ear: External ear normal.  Left Ear: External ear normal.  Nose: Nose normal.  Mouth/Throat: Oropharynx is clear and moist. No oropharyngeal exudate.  Eyes: Conjunctivae and EOM are normal. Pupils are equal, round, and reactive to light. Right eye exhibits no discharge. Left eye exhibits no discharge. No scleral icterus.  Neck: Neck supple. No JVD present. No tracheal deviation present. No thyromegaly present.  Cardiovascular: Normal rate, regular rhythm, normal heart sounds and intact distal pulses.  Exam reveals no gallop and no friction rub.   No murmur heard. Pulmonary/Chest: Effort normal and breath sounds normal. No respiratory distress. He has no wheezes. He has no rales. He exhibits no tenderness.  Abdominal: Soft. Bowel sounds are normal. He exhibits no distension and no mass. There is no tenderness. There is no rebound and no guarding.  Genitourinary: Rectum normal, prostate normal and penis normal. Guaiac negative stool. No penile tenderness.  Musculoskeletal: Normal range of motion. He exhibits no edema and no tenderness.  Lymphadenopathy:    He has no cervical adenopathy.  Neurological: He is alert and oriented to person, place, and time. He has normal reflexes. No cranial nerve deficit. He exhibits  normal muscle tone. Coordination normal.  Skin: Skin is warm and dry. No rash noted. He is not diaphoretic. No erythema. No pallor.  Psychiatric: He has a normal mood and affect. His behavior is normal. Judgment and thought content normal.          Assessment & Plan:  Well exam. Get fasting labs. Set up another colonoscopy

## 2012-06-28 LAB — VITAMIN D 25 HYDROXY (VIT D DEFICIENCY, FRACTURES): Vit D, 25-Hydroxy: 82 ng/mL (ref 30–89)

## 2012-06-28 LAB — LDL CHOLESTEROL, DIRECT: Direct LDL: 85.5 mg/dL

## 2012-07-01 NOTE — Progress Notes (Signed)
Quick Note:  Left a message for pt to return call. ______ 

## 2012-07-01 NOTE — Progress Notes (Signed)
Quick Note:  Called and spoke pt and pt is aware. ______

## 2012-07-12 ENCOUNTER — Encounter: Payer: Self-pay | Admitting: Cardiology

## 2012-07-12 DIAGNOSIS — E785 Hyperlipidemia, unspecified: Secondary | ICD-10-CM | POA: Insufficient documentation

## 2012-07-12 DIAGNOSIS — N1831 Chronic kidney disease, stage 3a: Secondary | ICD-10-CM | POA: Insufficient documentation

## 2012-07-12 DIAGNOSIS — E118 Type 2 diabetes mellitus with unspecified complications: Secondary | ICD-10-CM | POA: Insufficient documentation

## 2012-07-13 ENCOUNTER — Encounter: Payer: Self-pay | Admitting: Cardiology

## 2012-07-13 ENCOUNTER — Ambulatory Visit (INDEPENDENT_AMBULATORY_CARE_PROVIDER_SITE_OTHER): Payer: Medicare Other | Admitting: Cardiology

## 2012-07-13 VITALS — BP 100/68 | HR 52 | Ht 69.5 in | Wt 223.0 lb

## 2012-07-13 DIAGNOSIS — I4589 Other specified conduction disorders: Secondary | ICD-10-CM

## 2012-07-13 DIAGNOSIS — E782 Mixed hyperlipidemia: Secondary | ICD-10-CM

## 2012-07-13 DIAGNOSIS — R0602 Shortness of breath: Secondary | ICD-10-CM

## 2012-07-13 DIAGNOSIS — E118 Type 2 diabetes mellitus with unspecified complications: Secondary | ICD-10-CM

## 2012-07-13 DIAGNOSIS — R42 Dizziness and giddiness: Secondary | ICD-10-CM

## 2012-07-13 DIAGNOSIS — E785 Hyperlipidemia, unspecified: Secondary | ICD-10-CM

## 2012-07-13 DIAGNOSIS — I2510 Atherosclerotic heart disease of native coronary artery without angina pectoris: Secondary | ICD-10-CM

## 2012-07-13 NOTE — Assessment & Plan Note (Signed)
No sign that this is HF related - most likely due to chronotropic incompetence. Will reassess after R hip pain is treated to determine which etiology is limiting activity more.

## 2012-07-13 NOTE — Assessment & Plan Note (Addendum)
Unusual numbers from recent check 0 HDL ~132 & LDL ~82.   On PharmQuest study drug. Check NMR panel in 3 months.

## 2012-07-13 NOTE — Patient Instructions (Addendum)
As we discussed, you heart rate does not respond appropriately to increased activity.  This may very well explain why you get tired easily.  From a heart artery disease standpoint, you seem stable with no Angina or Heart Failure symptoms.  We need to closely follow your Heart Rate.  The Monitor did show that the rate gets down into the 40s.   Your physician will be discussing your condition with the Electrophysiologists (heart electricians) to determine when & if we need to go forward with a Permanent Pacemaker.  Your condition is called: Chronotropic Incompetence.  We will recheck the Bicycle Stress Test (CPET) in October, to see if there has been any improvement.  Based upon the unusual cholesterol panel - we will be checking a more detailed NMR panel in ~ 3 months to clarify these numbers.  Marykay Lex, MD

## 2012-07-13 NOTE — Assessment & Plan Note (Signed)
Monitor did show Joe Murray with rates briefly in low 40s to high 30s, & the highest rate was ~96 bpm. Not on any rate control agents.  Discuss potential indication for PPM with EP MDs. Recheck CPET in October.

## 2012-07-13 NOTE — Assessment & Plan Note (Addendum)
No active Symptoms. Stable. Not on any BP medications -- no BB due to rate issues. On ASA & Plavix with no complications.  He is far enough out from interventions that he should be fine stopping Plavix 5-7 days before any procedures including colonoscopies.

## 2012-07-13 NOTE — Progress Notes (Signed)
[facility]   Clinic Note:  Patient ID: LOYALTY BRASHIER, male   DOB: 09/15/1940, 72 y.o.   MRN: [id number]  HPI: DESTON BILYEU is a 72 y.o. male with a PMH below who presents today for followup for his coronary disease, dyspnea on exertion evaluation with a CPET test.  As you recall is a 72 year old gentleman with a history of coronary disease most of the RCA with a MI in 2003 and unstable angina 2009. He had a negative Myoview in May of last year, but continue to note fatigue, decreased exercise tolerance with dyspnea. I then sent him for an CPET test patellar imaging finding of the peak V02 was low but this is in the setting of a notable chronotropic incompetence with resting bradycardia. He then wore monitor for about a month which did show some heart rates down into the high 30s and low 40s for brief periods but mostly were in the 50-55 range. He had PACs warm and occasional blocked PACs that would make his rate he looks lower.  Interval History: Since his last visit he is doing okay. He basically says that his activities almost more limited due to pain in his right leg/hip and thigh region. His wife does note decreased activity in the mouth and coughs and has to stop to catch his breath would do activities but is not really been doing that much the way of actual exercise. He basically says he has decreased get up and go to feel tired with activity. It did stop maybe 15 minutes for rest and doing yard work or basic housework. He denies any chest pain with rest or exertion he denies any PND, orthopnea, edema consistent with heart failure. He denies any palpitations, syncope or near-syncope. No lightheadedness or dizziness. He does occasionally feel orthostatic symptoms in early the morning when he goes him lying down or sitting down to standing up, but in the days that's better.  He denies any TIA or amaurosis fugax symptoms. No melena, hematochezia or hematuria. No  claudication.  Cardiovascular ROS: negative for - chest pain, edema, irregular heartbeat, loss of consciousness, murmur or rapid heart rate   Allergies  Allergen Reactions  . Statins     REACTION: myalgias  . Sulfonamide Derivatives     Current Outpatient Prescriptions  Medication Sig Dispense Refill  . aspirin EC 81 MG tablet Take 81 mg by mouth daily.      . Cholecalciferol (VITAMIN D3) 5000 UNITS CAPS Take 5,000 Units by mouth daily.  30 capsule  0  . Cinnamon 500 MG capsule Take 1,000 mg by mouth daily.        . clopidogrel (PLAVIX) 75 MG tablet Take 1 tablet (75 mg total) by mouth daily.  30 tablet  11  . cyclobenzaprine (FLEXERIL) 5 MG tablet daily as needed. One at night      . fish oil-omega-3 fatty acids 1000 MG capsule Take 2 g by mouth daily.        Marland Kitchen glipiZIDE (GLUCOTROL) 5 MG tablet Take 1 tablet (5 mg total) by mouth 2 (two) times daily before a meal.  180 tablet  3  . Lancets (BD LANCET ULTRAFINE 30G) MISC USE ONE EVERY DAY AS DIRECTED  100 each  10  . MAGNESIUM CHLORIDE PO Take 1 tablet by mouth daily.        Marland Kitchen omeprazole (PRILOSEC) 20 MG capsule Take 1 capsule (20 mg total) by mouth daily.  90 capsule  3  . ONE TOUCH ULTRA TEST test strip TEST ONCE DAILY AS DIRECTED AS NEEDED TO MONITOR BLOOD GLUCOSE  100 each  0   No current facility-administered medications for this visit.    Past Medical History  Diagnosis Date  . DM (diabetes mellitus), type 2 with complications     CAD  . CAD (coronary artery disease) 2003; 3/'09; 5/'13    sees Dr. Herbie Baltimore; 2D Echo - EF 55%, normal; 5/13 - Myoview, diaphragmatic attenuation;   . SVT (supraventricular tachycardia) 2003  . Insomnia   . Psoriasis     sees Dr. Jorja Loa  . Dyslipidemia, goal LDL below 70     on PharmQuest study medicaion.  Marland Kitchen GERD (gastroesophageal reflux disease)   . ED (erectile dysfunction)   . Exertional shortness of breath 01/12/2012    MET Test - EKG-sinus bradycardia, no arrhythmia/ectopy  . Sinus  bradycardia     Resting HR in 54s  . Chronotropic incompetence 12/2011    Past Surgical History  Procedure Laterality Date  . Shoulder surgery      left rotator cuff, Dr. Darrelyn Hillock  . Wrist fracture surgery      right  . Coronary angioplasty with stent placement  LAD - 2003, RCA 3 & 7/ '09    Cypher 3.0 x 32 mid LAD; Cyper 2.75 x 13 - distal RCA, 3.0 x 18 Prox RCA  . Esophagogastroduodenoscopy  08-11-02    esophageal dilation per Dr. Leone Payor  . Colonoscopy  08-11-02    clear, per Dr. Leone Payor, repeat in 10 yrs  . Cataract extraction    . Hernia repair      right inguinal   . Cardiac catheterization  09/21/2007    Moderate disease in the ostium of the diagonal which is jailed by the LAD stent and a new 70% lesion in the proximal RCA  . Cardiac catheterization  04/27/2007    Distal RCA-2x12 Voyager balloon, two inflations resulted in a lumen of 1.61mm, 2.75x13 was placed that covered both proximal and distal portions were well overlapped  . Cardiac catheterization  11/23/2001    Pre-dilated with a 2.5x15 CrossSail balloon of the LAD, 3x33 Cypher stent overlapped both proximal and distal lesion    History   Social History  . Marital Status: Married    Spouse Name: N/A    Number of Children: 4  . Years of Education: N/A   Occupational History  . Not on file.   Social History Main Topics  . Smoking status: Current Every Day Smoker    Types: Cigars  . Smokeless tobacco: Never Used     Comment: 2-3 per day  . Alcohol Use: Yes     Comment: once a month  . Drug Use: No  . Sexually Active: Not on file   Other Topics Concern  . Not on file   Social History Narrative   Exercises only on occasion.    ROS: A comprehensive Review of Systems - Negative except Pertinent positive as above and below Musculoskeletal ROS: positive for - muscle pain and pain in hip - right and leg - right, upper  PHYSICAL EXAM BP 100/68  Pulse 52  Ht 5' 9.5" (1.765 m)  Wt 223 lb (101.152 kg)  BMI  32.47 kg/m2 General appearance: alert, cooperative, appears stated age and Somewhat depressed mood and blunted affect. But pleasant and healthy-appearing. No acute distress. Neck: no adenopathy, no carotid bruit, no JVD, supple, symmetrical, trachea midline and thyroid not enlarged, symmetric, no  tenderness/mass/nodules Lungs: clear to auscultation bilaterally, normal percussion bilaterally and Nonlabored Heart: regular rate and rhythm, S1, S2 normal, no murmur, click, rub or gallop and normal apical impulse Abdomen: soft, non-tender; bowel sounds normal; no masses,  no organomegaly and Mild truncal obesity (weight is stable BMI 32.5) Extremities: extremities normal, atraumatic, no cyanosis or edema and varicose veins noted Pulses: 2+ and symmetric Neurologic: Grossly normal  [id number] today: Yes Rate: 52 , Rhythm: Sinus bradycardia; AV Block: Left anterior fascicular block; ST-T abnormalities: None, otherwise normal ECG with no significant change  ASSESSMENT: Relatively stable symptoms most likely consistent with chronotropic incompetence.  CAD (coronary artery disease) - Plan: EKG 12-Lead, CPET ONLY (MET TEST), NMR Lipoprofile with Lipids  Hyperlipemia, mixed - Plan: CPET ONLY (MET TEST), NMR Lipoprofile with Lipids  Chronotropic incompetence  Dyslipidemia, goal LDL below 70  Exertional shortness of breath  DM (diabetes mellitus), type 2 with complications - CAD  POSTURAL LIGHTHEADEDNESS  PLAN: Per Problem List  Followup in 6 months. After repeat CPET.  Marykay Lex, M.D., M.S. [facility] [address] [place], [place]  [address]  [phone] Pager # [phone] 07/13/2012 9:24 AM

## 2012-07-14 ENCOUNTER — Encounter: Payer: Self-pay | Admitting: Physician Assistant

## 2012-07-14 ENCOUNTER — Ambulatory Visit (INDEPENDENT_AMBULATORY_CARE_PROVIDER_SITE_OTHER): Payer: Medicare Other | Admitting: Physician Assistant

## 2012-07-14 VITALS — BP 100/60 | HR 52 | Ht 69.5 in | Wt 223.1 lb

## 2012-07-14 DIAGNOSIS — Z7901 Long term (current) use of anticoagulants: Secondary | ICD-10-CM

## 2012-07-14 DIAGNOSIS — Z1211 Encounter for screening for malignant neoplasm of colon: Secondary | ICD-10-CM

## 2012-07-14 MED ORDER — MOVIPREP 100 G PO SOLR: 1.0000 | Freq: Once | ORAL | Status: DC

## 2012-07-14 NOTE — Assessment & Plan Note (Signed)
This may very well be partly related to his chronotropic incompetence. He is not on any blood pressure medication. With an A1c of 7.0 I doubt this is to do a peripheral neuropathy.

## 2012-07-14 NOTE — Assessment & Plan Note (Addendum)
Followed by primary physician. Most recent A1c 7.0

## 2012-07-14 NOTE — Progress Notes (Signed)
Subjective:    Patient ID: Joe Murray, male    DOB: [birth year], 72 y.o.   MRN: [id number]  HPI  Krishna is a pleasant 72 year old white male known remotely to Dr. Leone Payor from prior screening colonoscopy done in 2004. This was a normal exam. He comes in today to discuss followup colonoscopy. He is now on Plavix and aspirin and has history of coronary artery disease for which he underwent stent placement in 2003 to the LAD and a second stent to the RCA in 2009. He also has history of diabetes mellitus, hypertension and GERD. He has no current GI complaints. He states that he does not have a bowel movement every day but generally is  not actually constipated and is not requiring laxatives. He denies any abdominal discomfort. His appetite has been fine and his weight has been stable. He does have history of chronic GERD for which she stays on Prilosec 20 mg once daily. He says this controls his symptoms well and he does not have any complaints of dysphagia He also had an upper endoscopy done by Dr. Leone Payor in 2004 at that time was complaining of dysphagia and was found to have an esophageal stricture which was dilated to 18 mm.     Review of Systems  Constitutional: Negative.   HENT: Negative.   Eyes: Negative.   Respiratory: Negative.   Cardiovascular: Negative.   Gastrointestinal: Negative.   Endocrine: Negative.   Genitourinary: Negative.   Musculoskeletal: Negative.   Skin: Negative.   Allergic/Immunologic: Negative.   Neurological: Negative.   Hematological: Negative.   Psychiatric/Behavioral: Negative.    Outpatient Prescriptions Prior to Visit  Medication Sig Dispense Refill  . aspirin EC 81 MG tablet Take 81 mg by mouth daily.      . Cholecalciferol (VITAMIN D3) 5000 UNITS CAPS Take 5,000 Units by mouth daily.  30 capsule  0  . Cinnamon 500 MG capsule Take 1,000 mg by mouth daily.        . clopidogrel (PLAVIX) 75 MG tablet Take 1 tablet (75 mg total) by mouth daily.  30  tablet  11  . cyclobenzaprine (FLEXERIL) 5 MG tablet daily as needed. One at night      . fish oil-omega-3 fatty acids 1000 MG capsule Take 2 g by mouth daily.        Marland Kitchen glipiZIDE (GLUCOTROL) 5 MG tablet Take 1 tablet (5 mg total) by mouth 2 (two) times daily before a meal.  180 tablet  3  . Lancets (BD LANCET ULTRAFINE 30G) MISC USE ONE EVERY DAY AS DIRECTED  100 each  10  . MAGNESIUM CHLORIDE PO Take 1 tablet by mouth daily.        Marland Kitchen omeprazole (PRILOSEC) 20 MG capsule Take 1 capsule (20 mg total) by mouth daily.  90 capsule  3  . ONE TOUCH ULTRA TEST test strip TEST ONCE DAILY AS DIRECTED AS NEEDED TO MONITOR BLOOD GLUCOSE  100 each  0   No facility-administered medications prior to visit.   Allergies  Allergen Reactions  . Statins     REACTION: myalgias  . Sulfonamide Derivatives    Patient Active Problem List   Diagnosis Date Noted  . DM (diabetes mellitus), type 2 with complications - CAD   . Dyslipidemia, goal LDL below 70   . Exertional shortness of breath 01/12/2012  . Chronotropic incompetence 12/25/2011  . ACUTE SINUSITIS, UNSPECIFIED 08/19/2009  . POSTURAL LIGHTHEADEDNESS 08/13/2009  . ERECTILE DYSFUNCTION, ORGANIC 05/01/2009  . GERD  04/18/2008  . CONTUSION, LOWER LEG 04/18/2008  . BENIGN PROSTATIC HYPERTROPHY, WITH OBSTRUCTION 07/27/2007  . CAD (coronary artery disease) - LAD & RCA  07/12/2001    Class: History of   History  Substance Use Topics  . Smoking status: Current Every Day Smoker    Types: Cigars  . Smokeless tobacco: Never Used     Comment: 2-3 per day  . Alcohol Use: Yes     Comment: once a month   family history includes Dementia in his father; Diabetes in his mother; Hearing loss in his father; and Heart attack in his brother and father.     Objective:   Physical Exam  Well-developed older white male in no acute distress, pleasant blood pressure 100/60 pulse 52 height 5 foot 9 weight 223. HEENT; nontraumatic normocephalic EOMI PERRLA sclera  anicteric, Neck; supple no JVD, Cardiovascular; regular rate and rhythm with S1-S2 no murmur or gout, Pulmonary; clear bilaterally, Abdomen; soft nontender nondistended bowel sounds are active there is no palpable mass or hepatosplenomegaly, Rectal; exam not done, Extremities ;no clubbing cyanosis or edema skin warm and dry, Psych ;mood and affect normal and appropriate       Assessment & Plan:  #14 72 year old male due for followup surveillance colonoscopy. Lastcolon done 2004 and normal. He is currently asymptomatic. #2 chronic antiplatelet therapy with aspirin and Plavix #3 coronary artery disease status post stents, last procedure 2009 #4 chronic GERD #5 Diabetes mellitus  Plan; patient is scheduled for colonoscopy with Dr. Benjamine Mola was discussed in detail with the patient and his wife and they are agreeable to proceed. We will obtain consent from his cardiologist Dr. Herbie Baltimore for him to come off of Plavix 5 days prior to the procedure. Relative risk benefit of stopping antiplatelet therapy was  Discussed.

## 2012-07-14 NOTE — Patient Instructions (Addendum)
We sent a prescription for the colonoscopy prep to [facility] [address]. You have been scheduled for a colonoscopy with propofol. Please follow written instructions given to you at your visit today.  Please pick up your prep kit at the pharmacy within the next 1-3 days. If you use inhalers (even only as needed), please bring them with you on the day of your procedure. Your physician has requested that you go to www.startemmi.com and enter the access code given to you at your visit today. This web site gives a general overview about your procedure. However, you should still follow specific instructions given to you by our office regarding your preparation for the procedure.  We will call you once we hear from Dr. Herbie Baltimore regarding the Plavix medication.

## 2012-07-15 NOTE — Progress Notes (Signed)
Agree with Ms. Esterwood's assessment and plan. Infant Doane E. Vickii Volland, MD, FACG   

## 2012-07-19 ENCOUNTER — Telehealth: Payer: Self-pay | Admitting: *Deleted

## 2012-07-19 NOTE — Telephone Encounter (Signed)
Dr Herbie Baltimore saw this patient on 07-14-2012.  Dr. Herbie Baltimore mentioned in his note that the patient is on Aspirin and Plavix  With no complications.  He is far enough out from interventions that he should be fine stopping Plavix 5-7 days before any procedures including colonoscopy.

## 2012-07-19 NOTE — Telephone Encounter (Signed)
I called the patient and advised him we heard from Dr. Bryan Lemma. Per [name] PA-C,  I told him he can hold the Plavix starting on 08-25-2012 and he can resume it on 09-01-2012.  He asked me about the Glipizide and I told him to take it the day before and hold it the day of the procedure 08-31-2012.  Patient understood the instructions.

## 2012-07-20 ENCOUNTER — Encounter: Payer: Self-pay | Admitting: Family Medicine

## 2012-08-01 ENCOUNTER — Encounter: Payer: Self-pay | Admitting: Internal Medicine

## 2012-08-31 ENCOUNTER — Ambulatory Visit (AMBULATORY_SURGERY_CENTER): Payer: Medicare Other | Admitting: Internal Medicine

## 2012-08-31 ENCOUNTER — Encounter: Payer: Self-pay | Admitting: Internal Medicine

## 2012-08-31 VITALS — BP 109/57 | HR 41 | Temp 97.1°F | Resp 26 | Ht 69.0 in | Wt 223.0 lb

## 2012-08-31 DIAGNOSIS — Z1211 Encounter for screening for malignant neoplasm of colon: Secondary | ICD-10-CM

## 2012-08-31 DIAGNOSIS — D126 Benign neoplasm of colon, unspecified: Secondary | ICD-10-CM

## 2012-08-31 DIAGNOSIS — K5730 Diverticulosis of large intestine without perforation or abscess without bleeding: Secondary | ICD-10-CM

## 2012-08-31 HISTORY — PX: COLONOSCOPY: SHX174

## 2012-08-31 LAB — GLUCOSE, CAPILLARY: Glucose-Capillary: 106 mg/dL — ABNORMAL HIGH (ref 70–99)

## 2012-08-31 MED ORDER — SODIUM CHLORIDE 0.9 % IV SOLN: 500.0000 mL | INTRAVENOUS | Status: DC

## 2012-08-31 NOTE — Progress Notes (Signed)
Called to room to assist during endoscopic procedure.  Patient ID and intended procedure confirmed with present staff. Received instructions for my participation in the procedure from the performing physician.  

## 2012-08-31 NOTE — Progress Notes (Signed)
1220  Pt states heart rate runs in the 40-50s.  Says he will probably need to have a pacemaker at some point in the future.

## 2012-08-31 NOTE — Op Note (Signed)
[facility] [address] [place], [address]   COLONOSCOPY PROCEDURE REPORT  PATIENT: Neythan, Kozlov  MR#: [id number] BIRTHDATE: November 28, 1940 , 71  yrs. old GENDER: Male ENDOSCOPIST: Iva Boop, MD, [facility] PROCEDURE DATE:  08/31/2012 PROCEDURE:   Colonoscopy with biopsy and snare polypectomy ASA CLASS:   Class III INDICATIONS:Average risk patient for colorectal cancer and Last colonoscopy performed 10 years ago. MEDICATIONS: propofol (Diprivan) 200mg  IV, MAC sedation, administered by CRNA, and These medications were titrated to patient response per physician's verbal order  DESCRIPTION OF PROCEDURE:   After the risks benefits and alternatives of the procedure were thoroughly explained, informed consent was obtained.  A digital rectal exam revealed no abnormalities of the rectum, A digital rectal exam revealed no prostatic nodules, and A digital rectal exam revealed the prostate was not enlarged.   The LB [id number]  endoscope was introduced through the anus and advanced to the cecum, which was identified by both the appendix and ileocecal valve. No adverse events experienced.   The quality of the prep was excellent, using MoviPrep  The instrument was then slowly withdrawn as the colon was fully examined.      COLON FINDINGS: Three sessile polyps measuring 2-6 mm in size were found at the cecum, in the transverse colon, and descending colon. A polypectomy was performed with cold forceps (cecal polyp)  and with a cold snare (other polyps).  The resection was complete and the polyp tissue was completely retrieved.   Moderate diverticulosis was noted The finding was in the left colon.   The colon mucosa was otherwise normal.   A right colon retroflexion was performed.  Retroflexed views revealed no abnormalities. The time to cecum=2 minutes 59 seconds.  Withdrawal time=13 minutes 27 seconds.  The scope was withdrawn and the procedure  completed. COMPLICATIONS: There were no complications.  ENDOSCOPIC IMPRESSION: 1.   Three sessile polyps measuring 2-6 mm in size were found at the cecum, in the transverse colon, and descending colon; polypectomy was performed with cold forceps and with a cold snare 2.   Moderate diverticulosis was noted in the left colon 3.   The colon mucosa was otherwise normal - excellent prep, second screening colonoscopy  RECOMMENDATIONS: 1.  Timing of repeat colonoscopy will be determined by pathology findings. 2.   resume Clopidogrel (Plavix)   eSigned:  Iva Boop, MD, [facility] 08/31/2012 12:24 PM   cc: The Patient

## 2012-08-31 NOTE — Progress Notes (Signed)
Patient did not experience any of the following events: a burn prior to discharge; a fall within the facility; wrong site/side/patient/procedure/implant event; or a hospital transfer or hospital admission upon discharge from the facility. (G8907) Patient did not have preoperative order for IV antibiotic SSI prophylaxis. (G8918)  

## 2012-08-31 NOTE — Patient Instructions (Addendum)
I found and removed three small polyps that look benign. You also have diverticulosis which is common and not usually a problem.  I will let you know pathology results and when to have another routine colonoscopy by mail.  Resume Plavix.   I appreciate the opportunity to care for you. Iva Boop, MD, FACG  YOU HAD AN ENDOSCOPIC PROCEDURE TODAY AT THE [facility]: Refer to the procedure report that was given to you for any specific questions about what was found during the examination.  If the procedure report does not answer your questions, please call your gastroenterologist to clarify.  If you requested that your care partner not be given the details of your procedure findings, then the procedure report has been included in a sealed envelope for you to review at your convenience later.  YOU SHOULD EXPECT: Some feelings of bloating in the abdomen. Passage of more gas than usual.  Walking can help get rid of the air that was put into your GI tract during the procedure and reduce the bloating. If you had a lower endoscopy (such as a colonoscopy or flexible sigmoidoscopy) you may notice spotting of blood in your stool or on the toilet paper. If you underwent a bowel prep for your procedure, then you may not have a normal bowel movement for a few days.  DIET: Your first meal following the procedure should be a light meal and then it is ok to progress to your normal diet.  A half-sandwich or bowl of soup is an example of a good first meal.  Heavy or fried foods are harder to digest and may make you feel nauseous or bloated.  Likewise meals heavy in dairy and vegetables can cause extra gas to form and this can also increase the bloating.  Drink plenty of fluids but you should avoid alcoholic beverages for 24 hours.  ACTIVITY: Your care partner should take you home directly after the procedure.  You should plan to take it easy, moving slowly for the rest of the day.  You can resume normal  activity the day after the procedure however you should NOT DRIVE or use heavy machinery for 24 hours (because of the sedation medicines used during the test).    SYMPTOMS TO REPORT IMMEDIATELY: A gastroenterologist can be reached at any hour.  During normal business hours, 8:30 AM to 5:00 PM Monday through Friday, call [phone].  After hours and on weekends, please call the GI answering service at [phone] who will take a message and have the physician on call contact you.   Following lower endoscopy (colonoscopy or flexible sigmoidoscopy):  Excessive amounts of blood in the stool  Significant tenderness or worsening of abdominal pains  Swelling of the abdomen that is new, acute  Fever of 100F or higher  Black, tarry-looking stools  FOLLOW UP: If any biopsies were taken you will be contacted by phone or by letter within the next 1-3 weeks.  Call your gastroenterologist if you have not heard about the biopsies in 3 weeks.  Our staff will call the home number listed on your records the next business day following your procedure to check on you and address any questions or concerns that you may have at that time regarding the information given to you following your procedure. This is a courtesy call and so if there is no answer at the home number and we have not heard from you through the emergency physician on call, we will  assume that you have returned to your regular daily activities without incident.  SIGNATURES/CONFIDENTIALITY: You and/or your care partner have signed paperwork which will be entered into your electronic medical record.  These signatures attest to the fact that that the information above on your After Visit Summary has been reviewed and is understood.  Full responsibility of the confidentiality of this discharge information lies with you and/or your care-partner.  Please follow all discharge instructions given to you by the recovery room nurse. If you have any  questions or problems after discharge please call one of the numbers listed above. You will receive a phone call in the am to see how you are doing and answer any questions you may have. Thank you for choosing [facility] for your health care needs.

## 2012-09-01 ENCOUNTER — Telehealth: Payer: Self-pay | Admitting: *Deleted

## 2012-09-01 NOTE — Telephone Encounter (Signed)
  Follow up Call-  Call back number 08/31/2012  Post procedure Call Back phone  # [phone] or [phone] can leave message  Permission to leave phone message Yes     Patient questions:  Do you have a fever, pain , or abdominal swelling? no Pain Score  0 *  Have you tolerated food without any problems? yes  Have you been able to return to your normal activities? yes  Do you have any questions about your discharge instructions: Diet   no Medications  no Follow up visit  no  Do you have questions or concerns about your Care? no  Actions: * If pain score is 4 or above: No action needed, pain <4.

## 2012-09-06 ENCOUNTER — Encounter: Payer: Self-pay | Admitting: Internal Medicine

## 2012-09-06 DIAGNOSIS — Z860100 Personal history of colon polyps, unspecified: Secondary | ICD-10-CM | POA: Insufficient documentation

## 2012-09-06 HISTORY — DX: Personal history of colonic polyps: Z86.010

## 2012-09-06 HISTORY — DX: Personal history of colon polyps, unspecified: Z86.0100

## 2012-09-06 NOTE — Progress Notes (Signed)
Quick Note:  3 small adenomas max 6 mm Repeat colon about 08/2015 ______

## 2012-11-02 ENCOUNTER — Telehealth: Payer: Self-pay | Admitting: Cardiology

## 2012-11-02 NOTE — Telephone Encounter (Signed)
Forwarded to Sharon Martin R.N. 

## 2012-11-02 NOTE — Telephone Encounter (Signed)
Wants to know if you received his lab results from [facility]? Does he need lab work before his appt on November 17th?

## 2012-11-03 NOTE — Telephone Encounter (Signed)
Spoke to patient. Informed that the office has no received labs from [facility] Informed patient that research studies  Usually do not give the information concerning the study info.  Patient at last visit needed a NMR and lipid. Mailed him a copy to do before upcoming visit.

## 2012-12-24 HISTORY — PX: NM MYOVIEW LTD: HXRAD82

## 2012-12-27 ENCOUNTER — Other Ambulatory Visit: Payer: Self-pay | Admitting: Cardiology

## 2012-12-28 LAB — NMR LIPOPROFILE WITH LIPIDS
Cholesterol, Total: 234 mg/dL — ABNORMAL HIGH (ref <200)
HDL Size: 11 nm (ref >=9.2)
LDL Particle Number: 440 nmol/L (ref <1000)
Large HDL-P: >20 umol/L (ref >=4.8)
Large VLDL-P: 3 nmol/L — ABNORMAL HIGH (ref <=2.7)
Small LDL Particle Number: <90 nmol/L (ref <=527)
VLDL Size: 48.3 nm — ABNORMAL HIGH (ref <=46.6)

## 2012-12-29 ENCOUNTER — Telehealth: Payer: Self-pay | Admitting: *Deleted

## 2012-12-29 NOTE — Telephone Encounter (Signed)
Message copied by Tobin Chad on Thu Dec 29, 2012 11:33 AM ------      Message from: Marykay Lex      Created: Wed Dec 28, 2012  6:30 PM       Overall pretty good - LDL is close to goal & HDL is well above goal.  Triglycerides are a bit up --> cut back carbohydrates.      Total Cholesterol is also up - mostly due to TG.            [name],[name]       ------

## 2012-12-29 NOTE — Telephone Encounter (Signed)
Spoke to wife. Result given . Verbalized understanding  Patient has an appointment 01/09/13

## 2013-01-09 ENCOUNTER — Encounter: Payer: Self-pay | Admitting: Cardiology

## 2013-01-09 ENCOUNTER — Ambulatory Visit (INDEPENDENT_AMBULATORY_CARE_PROVIDER_SITE_OTHER): Payer: Medicare Other | Admitting: Cardiology

## 2013-01-09 VITALS — BP 138/78 | HR 52 | Ht 69.5 in | Wt 212.6 lb

## 2013-01-09 DIAGNOSIS — I4589 Other specified conduction disorders: Secondary | ICD-10-CM

## 2013-01-09 DIAGNOSIS — Z9861 Coronary angioplasty status: Secondary | ICD-10-CM

## 2013-01-09 DIAGNOSIS — E785 Hyperlipidemia, unspecified: Secondary | ICD-10-CM

## 2013-01-09 DIAGNOSIS — I2510 Atherosclerotic heart disease of native coronary artery without angina pectoris: Secondary | ICD-10-CM

## 2013-01-09 DIAGNOSIS — E669 Obesity, unspecified: Secondary | ICD-10-CM

## 2013-01-09 NOTE — Progress Notes (Signed)
Patient ID: Joe Murray, male   DOB: 07-13-1940, 71 y.o.   MRN: [id number] PCP: Nelwyn Salisbury, MD  Clinic Note: Chief Complaint:  Chief Complaint  Patient presents with  . 6 month visit    no chest pain, no sob, noedema    HPI: Joe Murray is a 72 y.o. male with a PMH below (notable for CAD involving most of the RCA. MI in 2003 and unstable angina 2009) who presents today for followup for his coronary disease, dyspnea on exertion evaluation that may very well be related to a cup incompetence and resting bradycardia. This was noted on a CPET-MET test earlier this year. He has had a negative Myoview a year and half ago to evaluate similar symptoms. He was supposed to have had a CPET test prior to this visit, however that was not done. He is also on a study protocol medication for his cholesterol. I did check an NMR panel after his last visit which showed an LDL of 75, total cholesterol however was 253 and an HDL of 132.8.  HDL particle number was 37 (therefore this does not correlate with his measured HDL but that along with the size was normal -- by the NMR panel his HDL C was measured at 113.) His LDL particle number was 440.  All told this would say that his levels were relatively normal. I just do not understand where the total cholesterol level came from.  Interval History: Since his last visit he has been doing okay. He continues to note that his activities almost more limited due to pain in his right leg/hip and thigh region. But he does note having "decreased get up and go" and feeling tired with activity. 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   He denies any chest pain with rest or exertion he denies any PND, orthopnea, edema consistent with heart failure. He denies any palpitations, syncope or near-syncope. No lightheadedness or dizziness. He does occasionally feel orthostatic symptoms in early the morning  when he goes him lying down or sitting down to standing up, but in the days that's better.  He denies any TIA or amaurosis fugax symptoms. No melena, hematochezia or hematuria. No claudication.  Cardiovascular ROS: no chest pain or dyspnea on exertion negative for - chest pain, dyspnea on exertion, edema, irregular heartbeat, loss of consciousness, murmur, orthopnea, palpitations, paroxysmal nocturnal dyspnea, rapid heart rate or shortness of breath Occasional posititional dizziness with turning head certain way -- that he calls vertigo No Melena, hematochezia, hematuria or nosebleeds.   Allergies  Allergen Reactions  . Statins     REACTION: myalgias  . Sulfonamide Derivatives     Current Outpatient Prescriptions  Medication Sig Dispense Refill  . aspirin EC 81 MG tablet Take 81 mg by mouth daily.      . Cholecalciferol (VITAMIN D3) 5000 UNITS CAPS Take 5,000 Units by mouth daily.  30 capsule  0  . Cinnamon 500 MG capsule Take 2,000 mg by mouth daily.       . clopidogrel (PLAVIX) 75 MG tablet Take 1 tablet (75 mg total) by mouth daily.  30 tablet  11  . cyclobenzaprine (FLEXERIL) 5 MG tablet daily as needed. One at night      . fish oil-omega-3 fatty acids 1000 MG capsule Take 2 g by mouth daily.        Marland Kitchen glipiZIDE (GLUCOTROL) 5 MG tablet Take 1  tablet (5 mg total) by mouth 2 (two) times daily before a meal.  180 tablet  3  . Lancets (BD LANCET ULTRAFINE 30G) MISC USE ONE EVERY DAY AS DIRECTED  100 each  10  . MAGNESIUM CHLORIDE PO Take 1 tablet by mouth daily.        Marland Kitchen omeprazole (PRILOSEC) 20 MG capsule Take 1 capsule (20 mg total) by mouth daily.  90 capsule  3  . ONE TOUCH ULTRA TEST test strip TEST ONCE DAILY AS DIRECTED AS NEEDED TO MONITOR BLOOD GLUCOSE  100 each  0  . potassium gluconate 595 MG TABS tablet Take 595 mg by mouth.       No current facility-administered medications for this visit.    Past Medical History  Diagnosis Date  . DM (diabetes mellitus), type 2 with  complications     CAD  . CAD (coronary artery disease) 2003; 3/'09; 5/'13    sees Dr. Herbie Baltimore; 2D Echo - EF 55%, normal; 5/13 - Myoview, diaphragmatic attenuation;   . SVT (supraventricular tachycardia) 2003  . Insomnia   . Psoriasis     sees Dr. Jorja Loa  . Dyslipidemia, goal LDL below 70     on [facility] study medicaion.  Marland Kitchen GERD (gastroesophageal reflux disease)   . ED (erectile dysfunction)   . Exertional shortness of breath 01/12/2012    MET Test - EKG-sinus bradycardia, no arrhythmia/ectopy  . Sinus bradycardia     Resting HR in 54s  . Chronotropic incompetence 12/2011  . Hemorrhoids 07/2002    Internal and External  . Spasm of esophagus   . Diverticulosis   . Personal history of colonic adenomas 09/06/2012    Past Surgical History  Procedure Laterality Date  . Shoulder surgery      left rotator cuff, Dr. Darrelyn Hillock  . Wrist fracture surgery      right  . Coronary angioplasty with stent placement  LAD - 2003, RCA 3 & 7/ '09    Cypher 3.0 x 32 mid LAD; Cyper 2.75 x 13 - distal RCA, 3.0 x 18 Prox RCA  . Esophagogastroduodenoscopy  08-11-02    esophageal dilation per Dr. Leone Payor  . Colonoscopy  2004, 2014    Leone Payor  . Cataract extraction  [id number]  . Hernia repair      right inguinal   . Cardiac catheterization  09/21/2007    Moderate disease in the ostium of the diagonal which is jailed by the LAD stent and a new 70% lesion in the proximal RCA  . Cardiac catheterization  04/27/2007    Distal RCA-2x12 Voyager balloon, two inflations resulted in a lumen of 1.41mm, 2.75x13 was placed that covered both proximal and distal portions were well overlapped  . Cardiac catheterization  11/23/2001    Pre-dilated with a 2.5x15 CrossSail balloon of the LAD, 3x33 Cypher stent overlapped both proximal and distal lesion   History   Social History Narrative   He is a married, father of 3, grandfather 3.   He still smokes 2-3 cigars per day. States that he "does not really inhale ". He is  not really all that it's including, stating that he wants to have his 1 remaining vice.   Exercises only on occasion.   He does various landscaping jobs including cutting wood, and clearing brush.    ROS: A comprehensive Review of Systems - Negative except Pertinent positive as above and below Musculoskeletal ROS: positive for - muscle pain and pain in hip - right and leg -  right, upper  PHYSICAL EXAM BP 138/78  Pulse 52  Ht 5' 9.5" (1.765 m)  Wt 212 lb 9.6 oz (96.435 kg)  BMI 30.96 kg/m2 General appearance: A&Ox3; cooperative, appears stated age and Somewhat depressed mood and blunted affect. But pleasant and healthy-appearing. No acute distress. Neck: no adenopathy, no carotid bruit, no JVD, supple, symmetrical, trachea midline and thyroid not enlarged, symmetric, no tenderness/mass/nodules Lungs: clear to auscultation bilaterally, normal percussion bilaterally and Nonlabored Heart: Regular rhythm with a bradycardia rate; S1, S2 normal, no murmur, click, rub or gallop and normal apical impulse Abdomen: soft, NT/ND; bowel sounds normal; no masses,  no organomegaly and Mild truncal obesity (weight is stable BMI 32.5) Extremities: extremities normal, atraumatic, no cyanosis or edema and varicose veins noted Pulses: 2+ and symmetric Neurologic: Grossly normal  MWU:[id number] today: Yes Rate: 52 , Rhythm: Sinus bradycardia; AV Block: Left anterior fascicular block; ST-T abnormalities: None, otherwise normal ECG with no significant change  ASSESSMENT / PLAN: Relatively stable symptoms most likely consistent with chronotropic incompetence.  Chronotropic incompetence He does continue to have fatigue type symptoms. We do need to monitor to see how well he is doing. He certainly has some lower rate readings on the monitor that he wore a hard time getting his heart rate up during his CPET test. Again, he is close to meeting threshold for considering a pacemaker placement. He doesn't seem all that  excited about that prospect.  Plan: Treadmill Exercise Stress Test, with the intent of evaluating his heart rate response to exercise. Depending on his performance, would consider referral for pacemaker consideration. He is not on any AV nodal agents  CAD S/P percutaneous coronary angioplasty - LAD, & RCA x 2 He remains relatively active without significant symptoms of angina or heart failure. He is not on any cardiac medicines besides aspirin, collateral, visual tablets and his [facility] study drug for cholesterol. Based on the new data from the DAPT Trial, I am more inclined to keep him on at least Plavix lifelong based on having Cypher stents (first generation DES).  Dyslipidemia, goal LDL below 70 Again, he had the very unusual numbers on his lipid panel. Really not sure how to interpret it.  Continue [facility] study drug.  Will defer Lipid checking to his PCP.  Obesity (BMI 30-39.9) I talked about trying to get exercise and watch his dietary intake. I do think that he would potentially be a lose weight if he had better heart rate response to exertion. He doesn't strike me as someone who is sedentary.   Follow Up - 6 months.  Marykay Lex, M.D., M.S. [facility] [address] [place], [place]  [address]  [phone] Pager # [phone]

## 2013-01-09 NOTE — Patient Instructions (Signed)
From a Heart Artery Disease standpoint, you seem to be doing well.  I am still concerned about your heart rate response to exercise.  I want to recheck your response with a Treadmill Test.  Depending on what that shows, I may be referring you to one of my partners to consider a Pacemaker placement.  Otherwise, I will see you back in ~6 months.

## 2013-01-12 ENCOUNTER — Encounter: Payer: Self-pay | Admitting: Cardiology

## 2013-01-12 DIAGNOSIS — E669 Obesity, unspecified: Secondary | ICD-10-CM | POA: Insufficient documentation

## 2013-01-12 NOTE — Assessment & Plan Note (Signed)
I talked about trying to get exercise and watch his dietary intake. I do think that he would potentially be a lose weight if he had better heart rate response to exertion. He doesn't strike me as someone who is sedentary.

## 2013-01-12 NOTE — Assessment & Plan Note (Signed)
Again, he had the very unusual numbers on his lipid panel. Really not sure how to interpret it.  Continue [facility] study drug.  Will defer Lipid checking to his PCP.

## 2013-01-12 NOTE — Assessment & Plan Note (Signed)
He does continue to have fatigue type symptoms. We do need to monitor to see how well he is doing. He certainly has some lower rate readings on the monitor that he wore a hard time getting his heart rate up during his CPET test. Again, he is close to meeting threshold for considering a pacemaker placement. He doesn't seem all that excited about that prospect.  Plan: Treadmill Exercise Stress Test, with the intent of evaluating his heart rate response to exercise. Depending on his performance, would consider referral for pacemaker consideration. He is not on any AV nodal agents

## 2013-01-12 NOTE — Assessment & Plan Note (Signed)
He remains relatively active without significant symptoms of angina or heart failure. He is not on any cardiac medicines besides aspirin, collateral, visual tablets and his [facility] study drug for cholesterol. Based on the new data from the DAPT Trial, I am more inclined to keep him on at least Plavix lifelong based on having Cypher stents (first generation DES).

## 2013-01-13 ENCOUNTER — Ambulatory Visit (HOSPITAL_COMMUNITY)
Admission: RE | Admit: 2013-01-13 | Discharge: 2013-01-13 | Disposition: A | Discharge status: Home / Self Care | Payer: Medicare Other | Source: Ambulatory Visit | Attending: Cardiovascular Disease | Admitting: Cardiovascular Disease

## 2013-01-13 DIAGNOSIS — R0602 Shortness of breath: Secondary | ICD-10-CM | POA: Insufficient documentation

## 2013-01-13 DIAGNOSIS — M79609 Pain in unspecified limb: Secondary | ICD-10-CM | POA: Insufficient documentation

## 2013-01-13 DIAGNOSIS — R0609 Other forms of dyspnea: Secondary | ICD-10-CM | POA: Insufficient documentation

## 2013-01-13 DIAGNOSIS — I4589 Other specified conduction disorders: Secondary | ICD-10-CM

## 2013-01-13 DIAGNOSIS — I2510 Atherosclerotic heart disease of native coronary artery without angina pectoris: Secondary | ICD-10-CM

## 2013-01-13 DIAGNOSIS — I498 Other specified cardiac arrhythmias: Secondary | ICD-10-CM | POA: Insufficient documentation

## 2013-01-13 DIAGNOSIS — Z9861 Coronary angioplasty status: Secondary | ICD-10-CM

## 2013-01-13 DIAGNOSIS — R42 Dizziness and giddiness: Secondary | ICD-10-CM | POA: Insufficient documentation

## 2013-01-13 DIAGNOSIS — R0989 Other specified symptoms and signs involving the circulatory and respiratory systems: Secondary | ICD-10-CM | POA: Insufficient documentation

## 2013-01-13 MED ORDER — TECHNETIUM TC 99M SESTAMIBI GENERIC - CARDIOLITE
10.0000 | Freq: Once | INTRAVENOUS | Status: AC | PRN
  Administered 2013-01-13: 10 via INTRAVENOUS

## 2013-01-13 MED ORDER — TECHNETIUM TC 99M SESTAMIBI GENERIC - CARDIOLITE
30.0000 | Freq: Once | INTRAVENOUS | Status: AC | PRN
  Administered 2013-01-13: 30 via INTRAVENOUS

## 2013-01-13 NOTE — Procedures (Addendum)
[facility] [address] [place] [address] [place] [address] [phone]  Cardiology Nuclear Med Study  Joe Murray is a 72 y.o. male     MRN : [id number]     DOB: [birth year]  Procedure Date: 01/13/2013  Nuclear Med Background Indication for Stress Test:  Stent Patency, PTCA Patency and bradycardia;AVB;LAFB History:  CAD;MI-2003;STENT/PTCA--08/2007;11/2001;SVT Cardiac Risk Factors: Family History - CAD, Hypertension, Lipids, NIDDM, Overweight and Smoker  Symptoms:  DOE and Light-Headedness   Nuclear Pre-Procedure Caffeine/Decaff Intake:  7:00pm NPO After: 5:00am   IV Site: R Hand  IV 0.9% NS with Angio Cath:  22g  Chest Size (in):  44"  IV Started by: Joe Pomfret, RN  Height: 5' 9.5" (1.765 m)  Cup Size: n/a  BMI:  Body mass index is 30.87 kg/(m^2). Weight:  212 lb (96.163 kg)   Tech Comments:  N/A    Nuclear Med Study 1 or 2 day study: 1 day  Stress Test Type:  Stress  Order Authorizing Provider:  Bryan Lemma, MD   Resting Radionuclide: Technetium 73m Sestamibi  Resting Radionuclide Dose: 9.7 mCi   Stress Radionuclide:  Technetium 13m Sestamibi  Stress Radionuclide Dose: 31.4 mCi           Stress Protocol Rest HR: 49 Stress HR: 126  Rest BP: 132/86 Stress BP: 167/72  Exercise Time (min): 6:46 METS: 8.1   Predicted Max HR: 148 bpm % Max HR: 85.14 bpm Rate Pressure Product: 21308  Dose of Adenosine (mg):  n/a Dose of Lexiscan: n/a mg  Dose of Atropine (mg): n/a Dose of Dobutamine: n/a mcg/kg/min (at max HR)  Stress Test Technologist: Joe Murray, CCT Nuclear Technologist: Joe Murray, CNMT   Rest Procedure:  Myocardial perfusion imaging was performed at rest 45 minutes following the intravenous administration of Technetium 35m Sestamibi. Stress Procedure:  The patient performed treadmill exercise using a Bruce  Protocol for 6:46 minutes. The patient stopped due to Severe Right hip and thigh pain,  progressive SOB and denied any chest pain.  There were no significant ST-T wave changes.  Technetium 66m Sestamibi was injected at peak exercise and myocardial perfusion imaging was performed after a brief delay.  Transient Ischemic Dilatation (Normal <1.22):  0.94 Lung/Heart Ratio (Normal <0.45):  0.40 QGS EDV:  76 ml QGS ESV:  31 ml LV Ejection Fraction: 60%  Signed by     Rest ECG: Sinus Bradycardia with isolated PVC  Stress ECG: No significant change from baseline ECG  QPS Raw Data Images:  Normal; no motion artifact; normal heart/lung ratio. Stress Images:  Normal homogeneous uptake in all areas of the myocardium. Rest Images:  Normal homogeneous uptake in all areas of the myocardium. Subtraction (SDS):  Normal  Impression Exercise Capacity:  Good exercise capacity. BP Response:  Normal blood pressure response. Clinical Symptoms:  Right hip pain and mild shortness of breath; no chest pain ECG Impression:  No significant ST segment change suggestive of ischemia. Comparison with Prior Nuclear Study: No significant change from prior study.  Overall Impression:  Normal stress nuclear study.  LV Wall Motion:  NL LV Function, EF 60%; NL Wall Motion   Joe Murray A, MD  01/13/2013 12:44 PM

## 2013-01-25 ENCOUNTER — Telehealth: Payer: Self-pay | Admitting: *Deleted

## 2013-01-25 NOTE — Telephone Encounter (Signed)
Spoke to patient. Myoview Result given . Verbalized understanding Per  Dr Herbie Baltimore - no pacemaker at needed at present- f/u in 6 month office visit

## 2013-01-25 NOTE — Telephone Encounter (Signed)
Message copied by Tobin Chad on Wed Jan 25, 2013 10:35 AM ------      Message from: Marykay Lex      Created: Fri Jan 13, 2013  4:46 PM       Well - he did the whole Myoview.  No ischemia.       Despite that, his HR also did well -- good response.  Able to reach target HR.  Better than las time.  This may mean we can avoid PPM for now.            Marykay Lex, MD       ------

## 2013-03-20 ENCOUNTER — Other Ambulatory Visit: Payer: Self-pay | Admitting: Cardiology

## 2013-03-20 ENCOUNTER — Other Ambulatory Visit: Payer: Self-pay | Admitting: *Deleted

## 2013-03-20 MED ORDER — CLOPIDOGREL BISULFATE 75 MG PO TABS: 75.0000 mg | ORAL_TABLET | Freq: Every day | ORAL | Status: DC

## 2013-03-20 NOTE — Telephone Encounter (Signed)
plavix refilled electronically to [facility].

## 2013-06-01 ENCOUNTER — Ambulatory Visit (INDEPENDENT_AMBULATORY_CARE_PROVIDER_SITE_OTHER): Payer: Medicare HMO | Admitting: Family Medicine

## 2013-06-01 ENCOUNTER — Telehealth: Payer: Self-pay | Admitting: Family Medicine

## 2013-06-01 ENCOUNTER — Encounter: Payer: Self-pay | Admitting: Family Medicine

## 2013-06-01 VITALS — BP 142/90 | HR 56 | Temp 97.7°F | Wt 216.0 lb

## 2013-06-01 DIAGNOSIS — H547 Unspecified visual loss: Secondary | ICD-10-CM

## 2013-06-01 DIAGNOSIS — H5710 Ocular pain, unspecified eye: Secondary | ICD-10-CM

## 2013-06-01 NOTE — Telephone Encounter (Signed)
Relevant patient education mailed to patient.  

## 2013-06-01 NOTE — Progress Notes (Signed)
No chief complaint on file.   HPI:  R eye pain:  -started 1 hour ago when something flew into his eye while chainsawing -severe pain or R eye, poor vision out of this eye, red, watery, sensitivity to light   ROS: See pertinent positives and negatives per HPI.  Past Medical History  Diagnosis Date  . DM (diabetes mellitus), type 2 with complications     CAD  . CAD (coronary artery disease) 2003; 3/'09; 5/'13    sees Dr. Ellyn Hack; 2D Echo - EF 55%, normal; 5/13 - Myoview, diaphragmatic attenuation;   . SVT (supraventricular tachycardia) 2003  . Insomnia   . Psoriasis     sees Dr. Denna Haggard  . Dyslipidemia, goal LDL below 70     on [facility] study medicaion.  Marland Kitchen GERD (gastroesophageal reflux disease)   . ED (erectile dysfunction)   . Exertional shortness of breath 01/12/2012    MET Test - EKG-sinus bradycardia, no arrhythmia/ectopy  . Sinus bradycardia     Resting HR in 54s  . Chronotropic incompetence 12/2011  . Hemorrhoids 07/2002    Internal and External  . Spasm of esophagus   . Diverticulosis   . Personal history of colonic adenomas 09/06/2012    Past Surgical History  Procedure Laterality Date  . Shoulder surgery      left rotator cuff, Dr. Gladstone Lighter  . Wrist fracture surgery      right  . Coronary angioplasty with stent placement  LAD - 2003, RCA 3 & 7/ '09    Cypher 3.0 x 32 mid LAD; Cyper 2.75 x 13 - distal RCA, 3.0 x 18 Prox RCA  . Esophagogastroduodenoscopy  08-11-02    esophageal dilation per Dr. Carlean Purl  . Colonoscopy  2004, 2014    Carlean Purl  . Cataract extraction  [id number]  . Hernia repair      right inguinal   . Cardiac catheterization  09/21/2007    Moderate disease in the ostium of the diagonal which is jailed by the LAD stent and a new 70% lesion in the proximal RCA  . Cardiac catheterization  04/27/2007    Distal RCA-2x12 Voyager balloon, two inflations resulted in a lumen of 1.27m, 2.75x13 was placed that covered both proximal and distal portions were  well overlapped  . Cardiac catheterization  11/23/2001    Pre-dilated with a 2.5x15 CrossSail balloon of the LAD, 3x33 Cypher stent overlapped both proximal and distal lesion    Family History  Problem Relation Age of Onset  . Diabetes Mother   . Hearing loss Father   . Dementia Father   . Heart attack Father   . Heart attack Brother     History   Social History  . Marital Status: Married    Spouse Name: N/A    Number of Children: 4  . Years of Education: N/A   Occupational History  . retired    Social History Main Topics  . Smoking status: Current Every Day Smoker    Types: Cigars  . Smokeless tobacco: Never Used     Comment: 2-3 per day; he says "I really don't inhale"  . Alcohol Use: Yes     Comment: once a month  . Drug Use: No  . Sexual Activity: None   Other Topics Concern  . None   Social History Narrative   He is a married, father of 380 grandfather 3.   He still smokes 2-3 cigars per day. States that he "does not really inhale ". He  is not really all that it's including, stating that he wants to have his 1 remaining vice.   Exercises only on occasion.   He does various landscaping jobs including cutting wood, and clearing brush.    Current outpatient prescriptions:aspirin EC 81 MG tablet, Take 81 mg by mouth daily., Disp: , Rfl: ;  Cholecalciferol (VITAMIN D3) 5000 UNITS CAPS, Take 5,000 Units by mouth daily., Disp: 30 capsule, Rfl: 0;  Cinnamon 500 MG capsule, Take 2,000 mg by mouth daily. , Disp: , Rfl: ;  clopidogrel (PLAVIX) 75 MG tablet, Take 1 tablet (75 mg total) by mouth daily with breakfast., Disp: 90 tablet, Rfl: 2 cyclobenzaprine (FLEXERIL) 5 MG tablet, daily as needed. One at night, Disp: , Rfl: ;  fish oil-omega-3 fatty acids 1000 MG capsule, Take 2 g by mouth daily.  , Disp: , Rfl: ;  glipiZIDE (GLUCOTROL) 5 MG tablet, Take 1 tablet (5 mg total) by mouth 2 (two) times daily before a meal., Disp: 180 tablet, Rfl: 3;  Lancets (BD LANCET ULTRAFINE 30G)  MISC, USE ONE EVERY DAY AS DIRECTED, Disp: 100 each, Rfl: 10 MAGNESIUM CHLORIDE PO, Take 1 tablet by mouth daily.  , Disp: , Rfl: ;  omeprazole (PRILOSEC) 20 MG capsule, Take 1 capsule (20 mg total) by mouth daily., Disp: 90 capsule, Rfl: 3;  ONE TOUCH ULTRA TEST test strip, TEST ONCE DAILY AS DIRECTED AS NEEDED TO MONITOR BLOOD GLUCOSE, Disp: 100 each, Rfl: 0;  potassium gluconate 595 MG TABS tablet, Take 595 mg by mouth., Disp: , Rfl:   EXAM:  Filed Vitals:   06/01/13 1333  BP: 142/90  Pulse: 56  Temp: 97.7 F (36.5 C)    Body mass index is 31.45 kg/(m^2).  GENERAL: vitals reviewed and listed above, alert, oriented, appears well hydrated and in no acute distress  HEENT: atraumatic, conjunttiva erythematous and watery discharge, difficulty opening eye, visual defect on exam - can not read magazine with other eye covered, R pupil < L, reactive to light and accomodation, difficulty with EOM due to pain, no obvious abnormalities on inspection of external nose and ears  NECK: no obvious masses on inspection  MS: moves all extremities without noticeable abnormality  PSYCH: pleasant and cooperative, no obvious depression or anxiety  ASSESSMENT AND PLAN:  Discussed the following assessment and plan:  Eye pain  Visual impairment  -concerned with degree of pain, visual impairment and exam findings and feel should be evaluated by opthomologist -offered to have staff call to help schedule him to see optho today but he was upset that he had "waited 30 minutes" here and declined and reported he will go to his eye doctor -advised of seriousness and urgency and he reported he will see an eye doctor today -staff reports he walked in several minutes before the appointment nad that cancellation on my schedule made his appt possible and that he had not waited 30 minutes -Patient advised to return or notify a doctor immediately if symptoms worsen or persist or new concerns arise.  There are no  Patient Instructions on file for this visit.   Lucretia Kern

## 2013-06-01 NOTE — Progress Notes (Signed)
Pre visit review using our clinic review tool, if applicable. No additional management support is needed unless otherwise documented below in the visit note. 

## 2013-06-29 ENCOUNTER — Encounter: Payer: Self-pay | Admitting: Family Medicine

## 2013-06-29 ENCOUNTER — Ambulatory Visit (INDEPENDENT_AMBULATORY_CARE_PROVIDER_SITE_OTHER): Payer: Medicare HMO | Admitting: Family Medicine

## 2013-06-29 VITALS — BP 147/76 | HR 49 | Temp 98.6°F | Ht 69.5 in | Wt 210.0 lb

## 2013-06-29 DIAGNOSIS — E569 Vitamin deficiency, unspecified: Secondary | ICD-10-CM

## 2013-06-29 DIAGNOSIS — N139 Obstructive and reflux uropathy, unspecified: Secondary | ICD-10-CM

## 2013-06-29 DIAGNOSIS — E785 Hyperlipidemia, unspecified: Secondary | ICD-10-CM

## 2013-06-29 DIAGNOSIS — I2510 Atherosclerotic heart disease of native coronary artery without angina pectoris: Secondary | ICD-10-CM

## 2013-06-29 DIAGNOSIS — K219 Gastro-esophageal reflux disease without esophagitis: Secondary | ICD-10-CM

## 2013-06-29 DIAGNOSIS — Z136 Encounter for screening for cardiovascular disorders: Secondary | ICD-10-CM

## 2013-06-29 DIAGNOSIS — N138 Other obstructive and reflux uropathy: Secondary | ICD-10-CM

## 2013-06-29 DIAGNOSIS — E119 Type 2 diabetes mellitus without complications: Secondary | ICD-10-CM

## 2013-06-29 DIAGNOSIS — E118 Type 2 diabetes mellitus with unspecified complications: Secondary | ICD-10-CM

## 2013-06-29 DIAGNOSIS — Z860100 Personal history of colon polyps, unspecified: Secondary | ICD-10-CM

## 2013-06-29 DIAGNOSIS — I4589 Other specified conduction disorders: Secondary | ICD-10-CM

## 2013-06-29 DIAGNOSIS — N401 Enlarged prostate with lower urinary tract symptoms: Secondary | ICD-10-CM

## 2013-06-29 DIAGNOSIS — E559 Vitamin D deficiency, unspecified: Secondary | ICD-10-CM

## 2013-06-29 DIAGNOSIS — R413 Other amnesia: Secondary | ICD-10-CM

## 2013-06-29 LAB — BASIC METABOLIC PANEL
BUN: 17 mg/dL (ref 6–23)
CHLORIDE: 102 meq/L (ref 96–112)
CO2: 25 mEq/L (ref 19–32)
Calcium: 9.1 mg/dL (ref 8.4–10.5)
Creatinine, Ser: 1.1 mg/dL (ref 0.4–1.5)
GFR: 67.53 mL/min (ref >60.00)
Glucose, Bld: 149 mg/dL — ABNORMAL HIGH (ref 70–99)
Potassium: 4.7 mEq/L (ref 3.5–5.1)
Sodium: 134 mEq/L — ABNORMAL LOW (ref 135–145)

## 2013-06-29 LAB — PSA: PSA: 1.75 ng/mL (ref 0.10–4.00)

## 2013-06-29 LAB — LIPID PANEL
CHOLESTEROL: 255 mg/dL — AB (ref 0–200)
HDL: 131.7 mg/dL (ref >39.00)
LDL Cholesterol: 98 mg/dL (ref 0–99)
Total CHOL/HDL Ratio: 2
Triglycerides: 129 mg/dL (ref 0.0–149.0)
VLDL: 25.8 mg/dL (ref 0.0–40.0)

## 2013-06-29 LAB — CBC WITH DIFFERENTIAL/PLATELET
BASOS ABS: 0 10*3/uL (ref 0.0–0.1)
BASOS PCT: 0.6 % (ref 0.0–3.0)
EOS ABS: 0.2 10*3/uL (ref 0.0–0.7)
Eosinophils Relative: 2.9 % (ref 0.0–5.0)
HCT: 44.2 % (ref 39.0–52.0)
Hemoglobin: 14.8 g/dL (ref 13.0–17.0)
Lymphocytes Relative: 23.4 % (ref 12.0–46.0)
Lymphs Abs: 1.7 10*3/uL (ref 0.7–4.0)
MCHC: 33.5 g/dL (ref 30.0–36.0)
MCV: 93.9 fl (ref 78.0–100.0)
MONO ABS: 0.6 10*3/uL (ref 0.1–1.0)
Monocytes Relative: 8 % (ref 3.0–12.0)
NEUTROS PCT: 65.1 % (ref 43.0–77.0)
Neutro Abs: 4.8 10*3/uL (ref 1.4–7.7)
Platelets: 213 10*3/uL (ref 150.0–400.0)
RBC: 4.7 Mil/uL (ref 4.22–5.81)
RDW: 13.6 % (ref 11.5–15.5)
WBC: 7.4 10*3/uL (ref 4.0–10.5)

## 2013-06-29 LAB — HEPATIC FUNCTION PANEL
ALBUMIN: 3.9 g/dL (ref 3.5–5.2)
ALK PHOS: 49 U/L (ref 39–117)
ALT: 15 U/L (ref 0–53)
AST: 18 U/L (ref 0–37)
Bilirubin, Direct: 0.1 mg/dL (ref 0.0–0.3)
Total Bilirubin: 0.8 mg/dL (ref 0.2–1.2)
Total Protein: 6.4 g/dL (ref 6.0–8.3)

## 2013-06-29 LAB — POCT URINALYSIS DIPSTICK
BILIRUBIN UA: NEGATIVE
Glucose, UA: NEGATIVE
Ketones, UA: NEGATIVE
LEUKOCYTES UA: NEGATIVE
NITRITE UA: NEGATIVE
PH UA: 7
Spec Grav, UA: 1.015
Urobilinogen, UA: 0.2

## 2013-06-29 LAB — TSH: TSH: 1.16 u[IU]/mL (ref 0.35–4.50)

## 2013-06-29 LAB — HEMOGLOBIN A1C: Hgb A1c MFr Bld: 6.8 % — ABNORMAL HIGH (ref 4.6–6.5)

## 2013-06-29 MED ORDER — OMEPRAZOLE 20 MG PO CPDR: 20.0000 mg | DELAYED_RELEASE_CAPSULE | Freq: Every day | ORAL | Status: DC

## 2013-06-29 MED ORDER — CYCLOBENZAPRINE HCL 5 MG PO TABS: 5.0000 mg | ORAL_TABLET | Freq: Every day | ORAL | Status: DC

## 2013-06-29 MED ORDER — GLIPIZIDE 5 MG PO TABS: 5.0000 mg | ORAL_TABLET | Freq: Two times a day (BID) | ORAL | Status: DC

## 2013-06-29 MED ORDER — DONEPEZIL HCL 5 MG PO TABS: 5.0000 mg | ORAL_TABLET | Freq: Every day | ORAL | Status: DC

## 2013-06-29 NOTE — Progress Notes (Signed)
   Subjective:    Patient ID: Joe Murray, male    DOB: 1940-11-10, 73 y.o.   MRN: [id number]  HPI 73 yr old male for a cpx. He feels well in general. He saw Dr. Ellyn Hack last fall and got a good report.    Review of Systems  Constitutional: Negative.   HENT: Negative.   Eyes: Negative.   Respiratory: Negative.   Cardiovascular: Negative.   Gastrointestinal: Negative.   Genitourinary: Negative.   Musculoskeletal: Negative.   Skin: Negative.   Neurological: Negative.   Psychiatric/Behavioral: Negative.        Objective:   Physical Exam  Constitutional: He is oriented to person, place, and time. He appears well-developed and well-nourished. No distress.  HENT:  Head: Normocephalic and atraumatic.  Right Ear: External ear normal.  Left Ear: External ear normal.  Nose: Nose normal.  Mouth/Throat: Oropharynx is clear and moist. No oropharyngeal exudate.  Eyes: Conjunctivae and EOM are normal. Pupils are equal, round, and reactive to light. Right eye exhibits no discharge. Left eye exhibits no discharge. No scleral icterus.  Neck: Neck supple. No JVD present. No tracheal deviation present. No thyromegaly present.  Cardiovascular: Normal rate, regular rhythm, normal heart sounds and intact distal pulses.  Exam reveals no gallop and no friction rub.   No murmur heard. Pulmonary/Chest: Effort normal and breath sounds normal. No respiratory distress. He has no wheezes. He has no rales. He exhibits no tenderness.  Abdominal: Soft. Bowel sounds are normal. He exhibits no distension and no mass. There is no tenderness. There is no rebound and no guarding.  Genitourinary: Rectum normal, prostate normal and penis normal. Guaiac negative stool. No penile tenderness.  Musculoskeletal: Normal range of motion. He exhibits no edema and no tenderness.  Lymphadenopathy:    He has no cervical adenopathy.  Neurological: He is alert and oriented to person, place, and time. He has normal  reflexes. No cranial nerve deficit. He exhibits normal muscle tone. Coordination normal.  Skin: Skin is warm and dry. No rash noted. He is not diaphoretic. No erythema. No pallor.  Psychiatric: He has a normal mood and affect. His behavior is normal. Judgment and thought content normal.          Assessment & Plan:  Well exam. Get fasting labs.

## 2013-06-29 NOTE — Progress Notes (Signed)
Pre visit review using our clinic review tool, if applicable. No additional management support is needed unless otherwise documented below in the visit note. 

## 2013-06-30 LAB — VITAMIN D 25 HYDROXY (VIT D DEFICIENCY, FRACTURES): Vit D, 25-Hydroxy: 76 ng/mL (ref 30–89)

## 2013-07-02 ENCOUNTER — Other Ambulatory Visit: Payer: Self-pay | Admitting: Family Medicine

## 2013-07-05 ENCOUNTER — Telehealth: Payer: Self-pay | Admitting: Family Medicine

## 2013-07-05 NOTE — Telephone Encounter (Signed)
A copy of lab results were faxed to Dr. Ellyn Hack with [facility], per pt request.

## 2013-07-06 ENCOUNTER — Ambulatory Visit (INDEPENDENT_AMBULATORY_CARE_PROVIDER_SITE_OTHER): Payer: Medicare HMO | Admitting: Cardiology

## 2013-07-06 ENCOUNTER — Encounter: Payer: Self-pay | Admitting: Cardiology

## 2013-07-06 VITALS — BP 122/78 | HR 52 | Ht 69.0 in | Wt 212.0 lb

## 2013-07-06 DIAGNOSIS — I4589 Other specified conduction disorders: Secondary | ICD-10-CM

## 2013-07-06 DIAGNOSIS — I2510 Atherosclerotic heart disease of native coronary artery without angina pectoris: Secondary | ICD-10-CM

## 2013-07-06 DIAGNOSIS — Z9861 Coronary angioplasty status: Secondary | ICD-10-CM

## 2013-07-06 DIAGNOSIS — R42 Dizziness and giddiness: Secondary | ICD-10-CM

## 2013-07-06 DIAGNOSIS — E785 Hyperlipidemia, unspecified: Secondary | ICD-10-CM

## 2013-07-06 MED ORDER — MECLIZINE HCL 25 MG PO TABS: 25.0000 mg | ORAL_TABLET | Freq: Three times a day (TID) | ORAL | Status: DC | PRN

## 2013-07-06 MED ORDER — CLOPIDOGREL BISULFATE 75 MG PO TABS: 75.0000 mg | ORAL_TABLET | Freq: Every day | ORAL | Status: DC

## 2013-07-06 NOTE — Patient Instructions (Signed)
May use ANTIVERT AS NEEDED - DISCUSS WITH PRIMARY   Your physician wants you to follow-up in [place].  You will receive a reminder letter in the mail two months in advance. If you don't receive a letter, please call our office to schedule the follow-up appointment.

## 2013-07-08 ENCOUNTER — Encounter: Payer: Self-pay | Admitting: Cardiology

## 2013-07-08 NOTE — Assessment & Plan Note (Signed)
He remains active, as active as he can be with his arthritis pains. No active signs of angina or heart failure. He is not on a beta blocker or other nodal agents because of his bradycardia. He does take aspirin and Plavix plus a ([facility]) trial medication as well as fish oil for lipids. He is diabetic, but has not had significant hypertension issues. Would consider ACE inhibitor/ARB both cardiac and diabetic if blood pressure appears to be increasing.

## 2013-07-08 NOTE — Assessment & Plan Note (Signed)
Thankfully he proved that he did get his heart rate up while not being on rate control agents. He was able to reach 85% of predicted heart rate and limited by arthritic pain as opposed to dyspnea

## 2013-07-08 NOTE — Progress Notes (Signed)
Patient ID: Joe Murray, male   DOB: 25-Jun-1940, 73 y.o.   MRN: [id number] PCP: Laurey Morale, MD  Clinic Note: Chief Complaint:  Chief Complaint  Patient presents with  . Follow-up    6 months follow, pt c/o dizziness   HPI: Joe Murray is a 73 y.o. male with a PMH below (CAD PCI to LAD & RCA. MI in 2003 and unstable angina 2009) who presents today for followup for his coronary disease, chronic dyspnea on exertion likely contributed to by chromotropic incompetence demonstrated on CPET-MET testing in early 2014.   When I saw him last fall, we ordered a treadmill nuclear stress test to assess worsening exertional dyspnea to reassess for possible ischemia. The initial intent was this was supposed to be a Treadmill Exercise Tolerance Test, but because there was concern in the needle to be to start her rate, he was switched to a Myoview by the doctor of the day. He was able to reach her target heart rate of 126 beats/ minute which was just at 85% of predicted heart rate and equals 8.1 METs. He walked just under 7 minutes and released it more do to get her thigh pain. He did have progressive shortness of breath but no chest pain. There are no regional wall abnormality is with a normal EF of 60% with no ischemia or infarction.  Interval History: Since his last visit he has been doing relatively well. He still notes some dyspnea but he is able to get about more than before after backing off on his AV nodal agents. Much like was the case with his stress test,he continues to note that his activities almost more limited due to pain in his right leg/hip and thigh region. But he does note having "decreased get up and go" and feeling tired with activity. 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   He denies any chest pain with rest or exertion he denies any PND, orthopnea, edema consistent with heart failure. He denies any  palpitations, syncope or near-syncope. He does occasionally feel orthostatic symptoms in early the morning when he goes him lying down or sitting down to standing up, but in the days that's better.  He denies any TIA or amaurosis fugax symptoms. No melena, hematochezia or hematuria. No claudication. He has lost some weight due to at dietary modification.  Allergies and Medications Reviewed in Epic   Past Medical History  Diagnosis Date  . Non-STEMI (non-ST elevated myocardial infarction) October 2003    Proximal LAD tandem lesions -- long DES stent covering both  . CAD S/P percutaneous coronary angioplasty 10/'03; 3/'09; 7/*09;      Dr. Ellyn Hack; '03 - Cypher DES 3.0 mm 33 mm proximal-mid LAD (details D1 with ostial 60%); 3/'09 dRCA 2.75 mm x 13 mm Cypher DES (2.8 mm); 7/'09 pRCA 3.0 mm x 18 m Cypher DES (3.25 mm);; (2009) 2D Echo - EF 55%, (November 2014) nonischemic Myoview;;   . SVT (supraventricular tachycardia) 2003  . Persistent sinus bradycardia   . Chronotropic incompetence 12/2011    For Exertional Dyspnea:  MET Test - EKG-sinus bradycardia, no arrhythmia/ectopy  . Dyslipidemia, goal LDL below 70     on [facility] study medicaion.  . Diabetes mellitus type 2 in obese     CAD  . ED (erectile dysfunction)   . Spasm of esophagus   . Diverticulosis   . Personal history of colonic adenomas  09/06/2012  . Insomnia   . Vertigo, benign positional   . Psoriasis     Resting HR in 54s  . Hemorrhoids 07/2002    Internal and External  . GERD (gastroesophageal reflux disease)     Social and Family History reviewed in Epic. No changes; he continues to smoke  ROS: A comprehensive Review of Systems - Negative except Pertinent positive as above and below Musculoskeletal ROS: positive for - muscle pain and pain in hip - right and leg - right, upper; he also notes positional vertigo with looking from one side the other side wall up underneath a vehicle doing work.  PHYSICAL EXAM BP 122/78   Pulse 52  Ht '5\' 9"'  (1.753 m)  Wt 212 lb (96.163 kg)  BMI 31.29 kg/m2 General appearance: A&Ox3; NAD. Pleasant mood and affect albeit somewhat blunted. Mildly obese. Well groomed Neck: no adenopathy, no carotid bruit, no JVD, supple, symmetrical, trachea midline and thyroid not enlarged, symmetric, no tenderness/mass/nodules Lungs: CTA B., normal percussion bilaterally and Nonlabored Heart: Regular rhythm with a bradycardia rate; S1, S2 normal, no murmur, click, rub or gallop and normal apical impulse Abdomen: soft, NT/ND; bowel sounds normal; no masses,  no organomegaly and Mild truncal obesity (weight is improved from 216, BMI 32.5) Extremities: extremities normal, atraumatic, no cyanosis or edema and varicose veins noted Pulses: 2+ and symmetric Neurologic: Grossly normal  [id number] today: Yes Rate: 52 , Rhythm: Sinus bradycardia with PVCs; 1 AV Block: Left anterior fascicular block; ST-T abnormalities: None, otherwise normal ECG with no significant change  ASSESSMENT / PLAN: Overall, relatively stable no active symptoms. He did well on the stress test showing any actually is able to get his heart rate up to a target heart rate of 85%. Negative Myoview in November which suggests no active CAD.  CAD S/P percutaneous coronary angioplasty - LAD, & RCA x 2 He remains active, as active as he can be with his arthritis pains. No active signs of angina or heart failure. He is not on a beta blocker or other nodal agents because of his bradycardia. He does take aspirin and Plavix plus a ([facility]) trial medication as well as fish oil for lipids. He is diabetic, but has not had significant hypertension issues. Would consider ACE inhibitor/ARB both cardiac and diabetic if blood pressure appears to be increasing.  Chronotropic incompetence Thankfully he proved that he did get his heart rate up while not being on rate control agents. He was able to reach 85% of predicted heart rate and limited  by arthritic pain as opposed to dyspnea  Vertigo with mild postural lightheadedness. The most notable Center he describes as a sounds like benign positional vertigo. He does have mild orthostatic symptoms, but is not on any medications that can adjust for that. Just encourage adequate hydration. We'll prescribe meclizine for vertigo.  Dyslipidemia, goal LDL below 70 His lipid numbers do not look real. He continues to be on the [facility] Trial medication. Defer lipid monitoring to PCP & await results of the trial.   Meds ordered this encounter  Medications  . meclizine (ANTIVERT) 25 MG tablet    Sig: Take 1 tablet (25 mg total) by mouth 3 (three) times daily as needed for dizziness.    Dispense:  60 tablet    Refill:  0  . clopidogrel (PLAVIX) 75 MG tablet    Sig: Take 1 tablet (75 mg total) by mouth daily with breakfast.    Dispense:  90 tablet  Refill:  3   Follow Up - 6 months.  Leonie Man, M.D., M.S. [facility] [address] [place],   [address]  [phone] Pager # [phone]

## 2013-07-08 NOTE — Assessment & Plan Note (Signed)
The most notable Center he describes as a sounds like benign positional vertigo. He does have mild orthostatic symptoms, but is not on any medications that can adjust for that. Just encourage adequate hydration. We'll prescribe meclizine for vertigo.

## 2013-07-08 NOTE — Assessment & Plan Note (Signed)
His lipid numbers do not look real. He continues to be on the [facility] Trial medication. Defer lipid monitoring to PCP & await results of the trial.

## 2013-07-18 ENCOUNTER — Telehealth: Payer: Self-pay

## 2013-07-18 NOTE — Telephone Encounter (Signed)
Relevant patient education mailed to patient.  

## 2013-08-21 ENCOUNTER — Encounter: Payer: Self-pay | Admitting: Family Medicine

## 2013-09-27 ENCOUNTER — Other Ambulatory Visit: Payer: Self-pay | Admitting: Cardiology

## 2013-09-27 NOTE — Telephone Encounter (Signed)
Rx refill denied to patient pharmacy. Note sent to please send to patient PCP

## 2013-10-17 ENCOUNTER — Telehealth: Payer: Self-pay | Admitting: Cardiology

## 2013-10-17 NOTE — Telephone Encounter (Signed)
Per Dr Allison Quarry last office note (from 07/06/2013) he ordered this as a PRN medication and to discuss the use of this medication with patient's PCP. Patient states he has been taking this medication on a daily basis.  Advised patient to seek refills from his PCP. If he has difficulty getting this medication refilled through PCP, he was advised that he could call us back to have the medication refilled.  Ultimately encouraged patient to call PCP.

## 2013-10-17 NOTE — Telephone Encounter (Signed)
Need a new prescription for his Meclizine 25 mg #90. Please call to [phone].

## 2013-10-24 ENCOUNTER — Telehealth: Payer: Self-pay | Admitting: Cardiology

## 2013-10-24 NOTE — Telephone Encounter (Signed)
Pt called in stating that he needs his rx for Meclizine refilled and called in to the [facility] on [address]. He also would like to know if there is anyway he can receive a 90 day supply. Please call  Thanks

## 2013-10-24 NOTE — Telephone Encounter (Signed)
Returned call to patient. Asked patient if had had talked to PCP about meclizine Rx since last OV in May the AVS notes state to talk with PCP about this med.. Of noted Joe Murray

## 2013-10-24 NOTE — Telephone Encounter (Signed)
Don't know why his PCP can't call this in.  This called in for enough to last until January.  Leonie Man, M.D., M.S. Interventional Cardiologist   Pager # [phone] 10/24/2013

## 2013-10-24 NOTE — Telephone Encounter (Signed)
Of note, [place], CMA spoke with patient last week and encouraged him to contact PCP. Patient states he has been trying all morning to get through and was told to contact cardiologist to get Rx. He then states "could Dr. Ellyn Hack refill until by physcial with Dr. Sarajane Jews in January?" - advised patient that Dr. Ellyn Hack would have to [place] med refill since he has already been notified he needed to get from PCP

## 2013-10-25 MED ORDER — MECLIZINE HCL 25 MG PO TABS: 25.0000 mg | ORAL_TABLET | Freq: Three times a day (TID) | ORAL | Status: DC | PRN

## 2013-10-25 NOTE — Telephone Encounter (Signed)
Rx was sent to pharmacy electronically. 

## 2013-10-25 NOTE — Addendum Note (Signed)
Addended by: Fidel Levy on: 10/25/2013 08:17 AM   Modules accepted: Orders

## 2013-12-18 ENCOUNTER — Ambulatory Visit: Payer: Medicare HMO

## 2014-01-08 ENCOUNTER — Encounter: Payer: Self-pay | Admitting: Cardiology

## 2014-01-08 ENCOUNTER — Ambulatory Visit (INDEPENDENT_AMBULATORY_CARE_PROVIDER_SITE_OTHER): Payer: Medicare HMO | Admitting: Cardiology

## 2014-01-08 VITALS — BP 122/70 | HR 45 | Ht 70.0 in | Wt 222.2 lb

## 2014-01-08 DIAGNOSIS — R0602 Shortness of breath: Secondary | ICD-10-CM

## 2014-01-08 DIAGNOSIS — E785 Hyperlipidemia, unspecified: Secondary | ICD-10-CM

## 2014-01-08 DIAGNOSIS — Z9861 Coronary angioplasty status: Secondary | ICD-10-CM

## 2014-01-08 DIAGNOSIS — E118 Type 2 diabetes mellitus with unspecified complications: Secondary | ICD-10-CM

## 2014-01-08 DIAGNOSIS — R5383 Other fatigue: Secondary | ICD-10-CM | POA: Insufficient documentation

## 2014-01-08 DIAGNOSIS — I2510 Atherosclerotic heart disease of native coronary artery without angina pectoris: Secondary | ICD-10-CM

## 2014-01-08 DIAGNOSIS — R42 Dizziness and giddiness: Secondary | ICD-10-CM

## 2014-01-08 DIAGNOSIS — I4589 Other specified conduction disorders: Secondary | ICD-10-CM

## 2014-01-08 DIAGNOSIS — E669 Obesity, unspecified: Secondary | ICD-10-CM

## 2014-01-08 NOTE — Patient Instructions (Signed)
Dr. Ellyn Hack would like to see you back in 6 months.  We will have our Pharmacist Kristin contact you concerning your cholesterol medication.

## 2014-01-08 NOTE — Assessment & Plan Note (Signed)
Chronic - stable & multifactorial -- ~ chronotropic intolerance, diastolic dysfunction, obesity & deconditioning.

## 2014-01-08 NOTE — Assessment & Plan Note (Signed)
We talked about dietary modification and the need to just push himself to do more activity a daily basis. We talked about gradually increasing his exercise time and frequency. His goal is at least in his back where he was in May by next veisit & approximately down to 200 pounds by the end of next year.

## 2014-01-08 NOTE — Assessment & Plan Note (Signed)
Just finished up [facility] Trial -- he should be hearing from the investigators as to whether not he was on the trial medication or the placebo. He will eventually need some type of therapy as his LDL is 98 and HDL is 130 from last check. We will order a NMR panel 4 January time frame to allow a washout period from the trial medication.  Hopefully this will help [place] elucidate the unusual lipid pattern. I will then refer him to our pharmacy team (led by Tommy Medal - clinical pharmacist) to help [place] understand and agree with trial and any potential other options for treating his dyslipidemia.

## 2014-01-08 NOTE — Assessment & Plan Note (Signed)
Was able to get his heart rate up during recent stress test adequately enough to not meet criteria for pacemaker.

## 2014-01-08 NOTE — Assessment & Plan Note (Signed)
Sartell whether he truly has fatigue related to his low resting heart rate with chronotropic incompetence however when we did a treadmill on him not to long ago he was able to get his heart at 85% Max Predicted Heart rate. He may have a slow response, but does not quite meet criteria for considering pacemaker placement.

## 2014-01-08 NOTE — Assessment & Plan Note (Signed)
Remains active, limited by exertional dyspnea and arthritis. No signs of angina is only on Glucotrol with when necessary nitroglycerin. Her heart rate will tolerate beta blocker, and blood pressure stable, not requiring ACE inhibitor or ARB.  Statin intolerant - just now completing the [facility] Lipid Medication Trial.

## 2014-01-08 NOTE — Progress Notes (Signed)
PCP: Laurey Morale, MD  Clinic Note: Chief Complaint  Patient presents with  . 6 MONTH VISIT    no chest pain ,no shortness of breathe, no edema, feet hurt when standing, dizziness-when taking antivert the dizziness is gone.    HPI: Joe Murray is a 73 y.o. male with a PMH below who presents today for 6 mon f/u of CAD-PCI & chronic fatgue/DOE with possible chrontropic incompetence..  Past Medical History  Diagnosis Date  . Non-STEMI (non-ST elevated myocardial infarction) October 2003    Proximal LAD tandem lesions -- long DES stent covering both  . CAD S/P percutaneous coronary angioplasty 10/'03; 3/'09; 7/*09;      Dr. Ellyn Hack; '03 - Cypher DES 3.0 mm 33 mm proximal-mid LAD (details D1 with ostial 60%); 3/'09 dRCA 2.75 mm x 13 mm Cypher DES (2.8 mm); 7/'09 pRCA 3.0 mm x 18 m Cypher DES (3.25 mm);; (2009) 2D Echo - EF 55%, (November 2014) nonischemic Myoview;;   . SVT (supraventricular tachycardia) 2003  . Persistent sinus bradycardia   . Chronotropic incompetence 12/2011    For Exertional Dyspnea:  MET Test - EKG-sinus bradycardia, no arrhythmia/ectopy  . Dyslipidemia, goal LDL below 70     on [facility] study medicaion.  . Diabetes mellitus type 2 in obese     CAD  . ED (erectile dysfunction)   . Spasm of esophagus   . Diverticulosis   . Personal history of colonic adenomas 09/06/2012  . Insomnia   . Vertigo, benign positional   . Psoriasis     Resting HR in 54s  . Hemorrhoids 07/2002    Internal and External  . GERD (gastroesophageal reflux disease)     Prior Cardiac Evaluation and Past Surgical History: Past Surgical History  Procedure Laterality Date  . Shoulder surgery      left rotator cuff, Dr. Gladstone Lighter  . Wrist fracture surgery      right  . Coronary angioplasty with stent placement  LAD - 2003, RCA 3 & 7/ '09    Cypher 3.0 x 32 mid LAD; Cyper 2.75 x 13 - distal RCA, 3.0 x 18 Prox RCA  . Esophagogastroduodenoscopy  08-11-02    esophageal dilation per  Dr. Carlean Purl  . Colonoscopy  08-31-12    per Dr. Carlean Purl, adenomatous polyps, repeat in 3 years   . Cataract extraction  [id number]  . Hernia repair      right inguinal   . Percutaneous coronary stent intervention (pci-s)  11/23/2001    NSTEMI: Prox-Mid LAD tandem ~80% lesions on either side of D1 (with 80% lesion) -- Cypher DES 3.0 mm x 33 m (covering both lesions)   . Percutaneous coronary stent intervention (pci-s)  04/27/2007    Unstable Angina: Distal RCA 95%: Cypher 2.75x13  (2.8 mm); residual focal ~60%ISR in LAD stent, 60% D1 ostial (no PTCA on LAD due to no ischemia on ST)  . Percutaneous coronary stent intervention (pci-s)  09/21/2007    Bradycardia & Unstable Angina: Prox RCA 70% - PCI Cypher DES 3.0 mm x 18 mm  (3.25 mm); ostial 60-70% jailed D1. LAD & distal RCA stents patent  . Nm myoview ltd  Nov 2014    ~8 METS; EF 60%, no ischemia or infarction  . Transthoracic echocardiogram  March 2009    Normal LV size and function, EF 55%. Mild MR; mild RV dilation.   Interval History: He presents today basically with no major change the side effects he's gained 12 pounds since his  last visit. He continues to deny any chest tightness or pressure with rest or exertion but still has his stable exertional dyspnea.  No PND, orthopnea or edema. He still has intermittent spells of dizziness mostly noted with first standing up. He also notes dry eyes in the morning. He denies any syncope or near syncope, TIA/amaurosis fugax symptoms.  NO  palpitations/rapid heart rates.  ROS: A comprehensive was performed. Review of Systems  Constitutional: Positive for malaise/fatigue.  HENT: Negative for nosebleeds.   Respiratory: Positive for cough and shortness of breath. Negative for hemoptysis, sputum production and wheezing.        Chronic daily cough  Cardiovascular: Negative for claudication.  Gastrointestinal: Negative for blood in stool and melena.  Genitourinary: Negative for hematuria.  Musculoskeletal:  Positive for joint pain. Negative for myalgias.  Neurological: Positive for dizziness. Negative for sensory change, speech change, focal weakness, seizures, loss of consciousness and headaches.  Endo/Heme/Allergies: Does not bruise/bleed easily.  Psychiatric/Behavioral: Positive for memory loss. Negative for depression. The patient is not nervous/anxious and does not have insomnia.   All other systems reviewed and are negative.   Current Outpatient Prescriptions on File Prior to Visit  Medication Sig Dispense Refill  . aspirin EC 81 MG tablet Take 81 mg by mouth daily.    . Cholecalciferol (VITAMIN D3) 5000 UNITS CAPS Take 5,000 Units by mouth daily. 30 capsule 0  . Cinnamon 500 MG capsule Take 2,000 mg by mouth daily.     . clopidogrel (PLAVIX) 75 MG tablet Take 1 tablet (75 mg total) by mouth daily with breakfast. 90 tablet 3  . cyclobenzaprine (FLEXERIL) 5 MG tablet Take 1 tablet (5 mg total) by mouth at bedtime. One at night 90 tablet 3  . donepezil (ARICEPT) 5 MG tablet Take 1 tablet (5 mg total) by mouth at bedtime. 90 tablet 3  . fish oil-omega-3 fatty acids 1000 MG capsule Take 2 g by mouth daily.      Marland Kitchen glipiZIDE (GLUCOTROL) 5 MG tablet Take 1 tablet (5 mg total) by mouth 2 (two) times daily before a meal. 180 tablet 3  . Lancets (BD LANCET ULTRAFINE 30G) MISC USE ONE EVERY DAY AS DIRECTED 100 each 10  . MAGNESIUM CHLORIDE PO Take 1 tablet by mouth daily.      . meclizine (ANTIVERT) 25 MG tablet Take 1 tablet (25 mg total) by mouth 3 (three) times daily as needed for dizziness. 90 tablet 2  . omeprazole (PRILOSEC) 20 MG capsule Take 1 capsule (20 mg total) by mouth daily. 90 capsule 3  . ONE TOUCH ULTRA TEST test strip TEST ONCE DAILY AS DIRECTED AS NEEDED TO MONITOR BLOOD GLUCOSE 100 each 0  . potassium gluconate 595 MG TABS tablet Take 595 mg by mouth.     No current facility-administered medications on file prior to visit.   ALLERGIES REVIEWED IN EPIC -- NO change SOCIAL AND  FAMILY HISTORY REVIEWED IN EPIC -- NO change  Wt Readings from Last 3 Encounters:  01/08/14 222 lb 3.2 oz (100.789 kg)  07/06/13 212 lb (96.163 kg)  06/29/13 210 lb (95.255 kg)    PHYSICAL EXAM BP 122/70 mmHg  Pulse 45  Ht '5\' 10"'  (1.778 m)  Wt 222 lb 3.2 oz (100.789 kg)  BMI 31.88 kg/m2 General appearance: A&Ox3; NAD. Pleasant mood and affect albeit somewhat blunted. Mildly obese. Well groomed Neck: no adenopathy, no carotid bruit, no JVD, supple, symmetrical, trachea midline and thyroid not enlarged, symmetric, no tenderness/mass/nodules Lungs: CTA  B., normal percussion bilaterally and Nonlabored Heart: Regular rhythm with a bradycardia rate; S1, S2 normal, no murmur, click, rub or gallop and normal apical impulse Abdomen: soft, NT/ND; bowel sounds normal; no masses, no organomegaly and Mild truncal obesity (weight is improved from 216, BMI 32.5) Extremities: extremities normal, atraumatic, no cyanosis or edema and varicose veins noted Pulses: 2+ and symmetric Neurologic: Grossly normal   Adult ECG Report  Rate: 45 ;  Rhythm: sinus bradycardia, LAFB - 48  Narrative Interpretation: Stable EKG  Recent Labs:     None since last visit  ASSESSMENT / PLAN: CAD S/P percutaneous coronary angioplasty - LAD, & RCA x 2 Remains active, limited by exertional dyspnea and arthritis. No signs of angina is only on Glucotrol with when necessary nitroglycerin. Her heart rate will tolerate beta blocker, and blood pressure stable, not requiring ACE inhibitor or ARB.  Statin intolerant - just now completing the [facility] Lipid Medication Trial.  Fatigue Sartell whether he truly has fatigue related to his low resting heart rate with chronotropic incompetence however when we did a treadmill on him not to long ago he was able to get his heart at 85% Max Predicted Heart rate. He may have a slow response, but does not quite meet criteria for considering pacemaker placement.  Chronotropic  incompetence Was able to get his heart rate up during recent stress test adequately enough to not meet criteria for pacemaker.  Dyslipidemia, goal LDL below 70 Just finished up [facility] Trial -- he should be hearing from the investigators as to whether not he was on the trial medication or the placebo. He will eventually need some type of therapy as his LDL is 98 and HDL is 130 from last check. We will order a NMR panel 4 January time frame to allow a washout period from the trial medication.  Hopefully this will help [place] elucidate the unusual lipid pattern. I will then refer him to our pharmacy team (led by Tommy Medal - clinical pharmacist) to help [place] understand and agree with trial and any potential other options for treating his dyslipidemia.  Obesity (BMI 30-39.9) We talked about dietary modification and the need to just push himself to do more activity a daily basis. We talked about gradually increasing his exercise time and frequency. His goal is at least in his back where he was in May by next veisit & approximately down to 200 pounds by the end of next year.  Exertional shortness of breath Chronic - stable & multifactorial -- ~ chronotropic intolerance, diastolic dysfunction, obesity & deconditioning.    Orders Placed This Encounter  Procedures  . EKG 12-Lead   Meds ordered this encounter  Medications  . sodium chloride (MURO 128) 5 % ophthalmic solution    Sig: Place 1 drop into both eyes at bedtime.    Followup: 6 months   [name],[name], M.D., M.S. Interventional Cardiologist   Pager # [phone]

## 2014-03-28 ENCOUNTER — Other Ambulatory Visit: Payer: Self-pay | Admitting: Dermatology

## 2014-07-04 ENCOUNTER — Encounter: Payer: Self-pay | Admitting: Family Medicine

## 2014-07-04 ENCOUNTER — Telehealth: Payer: Self-pay | Admitting: Family Medicine

## 2014-07-04 ENCOUNTER — Ambulatory Visit (INDEPENDENT_AMBULATORY_CARE_PROVIDER_SITE_OTHER): Payer: Medicare HMO | Admitting: Family Medicine

## 2014-07-04 VITALS — BP 160/76 | HR 50 | Temp 98.2°F | Ht 70.0 in | Wt 225.0 lb

## 2014-07-04 DIAGNOSIS — K219 Gastro-esophageal reflux disease without esophagitis: Secondary | ICD-10-CM | POA: Diagnosis not present

## 2014-07-04 DIAGNOSIS — N138 Other obstructive and reflux uropathy: Secondary | ICD-10-CM

## 2014-07-04 DIAGNOSIS — N401 Enlarged prostate with lower urinary tract symptoms: Secondary | ICD-10-CM | POA: Diagnosis not present

## 2014-07-04 DIAGNOSIS — I2510 Atherosclerotic heart disease of native coronary artery without angina pectoris: Secondary | ICD-10-CM | POA: Diagnosis not present

## 2014-07-04 DIAGNOSIS — E559 Vitamin D deficiency, unspecified: Secondary | ICD-10-CM

## 2014-07-04 DIAGNOSIS — Z9861 Coronary angioplasty status: Secondary | ICD-10-CM

## 2014-07-04 DIAGNOSIS — E118 Type 2 diabetes mellitus with unspecified complications: Secondary | ICD-10-CM | POA: Diagnosis not present

## 2014-07-04 LAB — HEPATIC FUNCTION PANEL
ALT: 13 U/L (ref 0–53)
AST: 14 U/L (ref 0–37)
Albumin: 3.9 g/dL (ref 3.5–5.2)
Alkaline Phosphatase: 62 U/L (ref 39–117)
BILIRUBIN TOTAL: 0.4 mg/dL (ref 0.2–1.2)
Bilirubin, Direct: 0.1 mg/dL (ref 0.0–0.3)
Total Protein: 6.6 g/dL (ref 6.0–8.3)

## 2014-07-04 LAB — CBC WITH DIFFERENTIAL/PLATELET
BASOS ABS: 0 10*3/uL (ref 0.0–0.1)
Basophils Relative: 0.7 % (ref 0.0–3.0)
Eosinophils Absolute: 0.2 10*3/uL (ref 0.0–0.7)
Eosinophils Relative: 3 % (ref 0.0–5.0)
HCT: 43.3 % (ref 39.0–52.0)
Hemoglobin: 14.8 g/dL (ref 13.0–17.0)
LYMPHS ABS: 1.7 10*3/uL (ref 0.7–4.0)
LYMPHS PCT: 26.2 % (ref 12.0–46.0)
MCHC: 34.3 g/dL (ref 30.0–36.0)
MCV: 90.6 fl (ref 78.0–100.0)
MONOS PCT: 7.8 % (ref 3.0–12.0)
Monocytes Absolute: 0.5 10*3/uL (ref 0.1–1.0)
Neutro Abs: 4.1 10*3/uL (ref 1.4–7.7)
Neutrophils Relative %: 62.3 % (ref 43.0–77.0)
PLATELETS: 210 10*3/uL (ref 150.0–400.0)
RBC: 4.78 Mil/uL (ref 4.22–5.81)
RDW: 12.9 % (ref 11.5–15.5)
WBC: 6.6 10*3/uL (ref 4.0–10.5)

## 2014-07-04 LAB — LIPID PANEL
CHOL/HDL RATIO: 5
Cholesterol: 222 mg/dL — ABNORMAL HIGH (ref 0–200)
HDL: 41.9 mg/dL (ref >39.00)
LDL Cholesterol: 143 mg/dL — ABNORMAL HIGH (ref 0–99)
NONHDL: 180.1
Triglycerides: 187 mg/dL — ABNORMAL HIGH (ref 0.0–149.0)
VLDL: 37.4 mg/dL (ref 0.0–40.0)

## 2014-07-04 LAB — PSA: PSA: 1.03 ng/mL (ref 0.10–4.00)

## 2014-07-04 LAB — HEMOGLOBIN A1C: HEMOGLOBIN A1C: 7.1 % — AB (ref 4.6–6.5)

## 2014-07-04 LAB — BASIC METABOLIC PANEL
BUN: 16 mg/dL (ref 6–23)
CALCIUM: 9.4 mg/dL (ref 8.4–10.5)
CO2: 25 mEq/L (ref 19–32)
Chloride: 103 mEq/L (ref 96–112)
Creatinine, Ser: 1.1 mg/dL (ref 0.40–1.50)
GFR: 69.47 mL/min (ref >60.00)
GLUCOSE: 152 mg/dL — AB (ref 70–99)
Potassium: 4.5 mEq/L (ref 3.5–5.1)
Sodium: 136 mEq/L (ref 135–145)

## 2014-07-04 LAB — TSH: TSH: 1.14 u[IU]/mL (ref 0.35–4.50)

## 2014-07-04 LAB — VITAMIN D 25 HYDROXY (VIT D DEFICIENCY, FRACTURES): VITD: 109.05 ng/mL — AB (ref 30.00–100.00)

## 2014-07-04 NOTE — Telephone Encounter (Signed)
Hope from lab called, pt's vitamin d level is 109.05.

## 2014-07-04 NOTE — Progress Notes (Signed)
Pre visit review using our clinic review tool, if applicable. No additional management support is needed unless otherwise documented below in the visit note. 

## 2014-07-04 NOTE — Telephone Encounter (Signed)
Full lab report is back, see Dr. Barbie Banner result note.

## 2014-07-04 NOTE — Telephone Encounter (Signed)
Dr. Sarajane Jews is aware, we are waiting on rest of lab results.

## 2014-07-04 NOTE — Progress Notes (Signed)
   Subjective:    Patient ID: Joe Murray, male    DOB: [birth year], 74 y.o.   MRN: [id number]  HPI 74 yr old male for a follow up on several issues. His BP has been stable, and his glucoses are well controlled. He sees Dr. Ellyn Hack twice a year.    Review of Systems  Constitutional: Negative.   HENT: Negative.   Eyes: Negative.   Respiratory: Negative.   Cardiovascular: Negative.   Gastrointestinal: Negative.   Genitourinary: Negative.   Musculoskeletal: Negative.   Skin: Negative.   Neurological: Negative.   Psychiatric/Behavioral: Negative.        Objective:   Physical Exam  Constitutional: He is oriented to person, place, and time. He appears well-developed and well-nourished. No distress.  HENT:  Head: Normocephalic and atraumatic.  Right Ear: External ear normal.  Left Ear: External ear normal.  Nose: Nose normal.  Mouth/Throat: Oropharynx is clear and moist. No oropharyngeal exudate.  Eyes: Conjunctivae and EOM are normal. Pupils are equal, round, and reactive to light. Right eye exhibits no discharge. Left eye exhibits no discharge. No scleral icterus.  Neck: Neck supple. No JVD present. No tracheal deviation present. No thyromegaly present.  Cardiovascular: Normal rate, regular rhythm, normal heart sounds and intact distal pulses.  Exam reveals no gallop and no friction rub.   No murmur heard. Pulmonary/Chest: Effort normal and breath sounds normal. No respiratory distress. He has no wheezes. He has no rales. He exhibits no tenderness.  Abdominal: Soft. Bowel sounds are normal. He exhibits no distension and no mass. There is no tenderness. There is no rebound and no guarding.  Genitourinary: Rectum normal, prostate normal and penis normal. Guaiac negative stool. No penile tenderness.  Musculoskeletal: Normal range of motion. He exhibits no edema or tenderness.  Lymphadenopathy:    He has no cervical adenopathy.  Neurological: He is alert and oriented to person,  place, and time. He has normal reflexes. No cranial nerve deficit. He exhibits normal muscle tone. Coordination normal.  Skin: Skin is warm and dry. No rash noted. He is not diaphoretic. No erythema. No pallor.  Psychiatric: He has a normal mood and affect. His behavior is normal. Judgment and thought content normal.          Assessment & Plan:  His HTN is stable. As for his diabetes we will get fasting labs today including an A1c. Check a vitamin D level. Check a lipid panel.

## 2014-07-05 ENCOUNTER — Encounter: Payer: Self-pay | Admitting: Cardiology

## 2014-07-16 ENCOUNTER — Other Ambulatory Visit: Payer: Self-pay | Admitting: Cardiology

## 2014-07-17 NOTE — Telephone Encounter (Signed)
Rx(s) sent to pharmacy electronically.  Meclizine refused, deferred to PCP.

## 2014-07-28 ENCOUNTER — Other Ambulatory Visit: Payer: Self-pay | Admitting: Cardiology

## 2014-07-28 ENCOUNTER — Other Ambulatory Visit: Payer: Self-pay | Admitting: Family Medicine

## 2014-07-30 NOTE — Telephone Encounter (Signed)
Rx has been sent to the pharmacy electronically. ° °

## 2014-07-31 ENCOUNTER — Encounter: Payer: Self-pay | Admitting: Cardiology

## 2014-07-31 ENCOUNTER — Ambulatory Visit (INDEPENDENT_AMBULATORY_CARE_PROVIDER_SITE_OTHER): Payer: Medicare HMO | Admitting: Cardiology

## 2014-07-31 VITALS — BP 130/78 | HR 42 | Ht 69.0 in | Wt 220.8 lb

## 2014-07-31 DIAGNOSIS — Z9861 Coronary angioplasty status: Secondary | ICD-10-CM

## 2014-07-31 DIAGNOSIS — E785 Hyperlipidemia, unspecified: Secondary | ICD-10-CM

## 2014-07-31 DIAGNOSIS — I2510 Atherosclerotic heart disease of native coronary artery without angina pectoris: Secondary | ICD-10-CM | POA: Diagnosis not present

## 2014-07-31 DIAGNOSIS — I4589 Other specified conduction disorders: Secondary | ICD-10-CM

## 2014-07-31 DIAGNOSIS — E669 Obesity, unspecified: Secondary | ICD-10-CM

## 2014-07-31 DIAGNOSIS — R42 Dizziness and giddiness: Secondary | ICD-10-CM

## 2014-07-31 NOTE — Patient Instructions (Signed)
Your physician recommends that you schedule a follow-up appointment in  [place] to discuss  Cholesterol- patient was on a study drug--- 30 min appointment  No changes with medication  Your physician wants you to follow-up in 6 month Dr Ellyn Hack.  You will receive a reminder letter in the mail two months in advance. If you don't receive a letter, please call our office to schedule the follow-up appointment.

## 2014-07-31 NOTE — Progress Notes (Signed)
Patient ID: [name], male   DOB: 1940-04-26, 74 y.o.   MRN: [id number] PCP: Laurey Morale, MD  Clinic Note: Chief Complaint:  Chief Complaint  Patient presents with  . Follow-up    6 month some dizziness, pain in the fee bottom of feet (after long time working on concrete)   . Coronary Artery Disease    Status post PCI    HPI: Joe Murray is a 74 y.o. male with a PMH below (CAD PCI to LAD & RCA. MI in 2003 and unstable angina 2009) who presents today for followup for his coronary disease, chronic dyspnea on exertion likely contributed to by chromotropic incompetence demonstrated on CPET-MET testing in early 2014.   He has chronic sinus bradycardia of his had a treadmill stress test on the use he would get short of 85% that was discussed during his last visit. He got his heart rate up to 126 beats/ minute. He stopped because of thigh pain. The imaging portion of the nuclear stress test was negative for ischemia or infarction per   Interval History: hypodense today without really any major complaints besides the dizziness that is oftentimes position related but also depend on how he turns his head. He did not have any syncope symptoms but does sometimes feel like he may pass out such as near syncope. Not associated with a rapid heart rate. He still mostly gets tired relatively quickly, but denies any chest tightness or pressure with exertion. No heart failure symptoms such as PND, orthopnea to one with mild edema. He has occasional "skipped beats" but doesn't have any sensation of rapid irregular heart beats.  Is usually the case, he does have one or she simply stopping oral agents. He is mostly limited by his hip and thigh pain.  Cardiovascular ROS: positive for - irregular heartbeat and slow heart rate, DOE with increased level of exertion negative for - chest pain, edema, loss of consciousness, murmur, orthopnea, palpitations, paroxysmal nocturnal dyspnea, rapid heart rate,  shortness of breath or TIA/amaurosis fugax, syncope / near syncope   Past Medical History  Diagnosis Date  . Non-STEMI (non-ST elevated myocardial infarction) October 2003    Proximal LAD tandem lesions -- long DES stent covering both  . CAD S/P percutaneous coronary angioplasty 10/'03; 3/'09; 7/*09;      Dr. Ellyn Hack; '03 - Cypher DES 3.0 mm 33 mm proximal-mid LAD (details D1 with ostial 60%); 3/'09 dRCA 2.75 mm x 13 mm Cypher DES (2.8 mm); 7/'09 pRCA 3.0 mm x 18 m Cypher DES (3.25 mm);; (2009) 2D Echo - EF 55%, (November 2014) nonischemic Myoview;;   . SVT (supraventricular tachycardia) 2003  . Persistent sinus bradycardia   . Chronotropic incompetence 12/2011    For Exertional Dyspnea:  MET Test - EKG-sinus bradycardia, no arrhythmia/ectopy;; treadmill stress test portion of Myoview: Able to achieve 85% maximum heart rate.  . Dyslipidemia, goal LDL below 70     on [facility] study medicaion.  . Diabetes mellitus type 2 in obese     CAD  . ED (erectile dysfunction)   . Spasm of esophagus   . Diverticulosis   . Personal history of colonic adenomas 09/06/2012  . Insomnia   . Vertigo, benign positional   . Psoriasis     Resting HR in 54s  . Hemorrhoids 07/2002    Internal and External  . GERD (gastroesophageal reflux disease)    Current Outpatient Prescriptions on File Prior to Visit  Medication Sig Dispense Refill  .  Cholecalciferol (VITAMIN D3) 5000 UNITS CAPS Take 5,000 Units by mouth daily. 30 capsule 0  . Cinnamon 500 MG capsule Take 2,000 mg by mouth daily.     . clopidogrel (PLAVIX) 75 MG tablet Take 1 tablet (75 mg total) by mouth daily. 90 tablet 1  . cyclobenzaprine (FLEXERIL) 5 MG tablet Take 1 tablet (5 mg total) by mouth at bedtime. One at night 90 tablet 3  . donepezil (ARICEPT) 5 MG tablet TAKE ONE TABLET BY MOUTH AT BEDTIME 90 tablet 0  . fish oil-omega-3 fatty acids 1000 MG capsule Take 2 g by mouth daily.      Marland Kitchen glipiZIDE (GLUCOTROL) 5 MG tablet Take 1 tablet (5 mg  total) by mouth 2 (two) times daily before a meal. 180 tablet 3  . Lancets (BD LANCET ULTRAFINE 30G) MISC USE ONE EVERY DAY AS DIRECTED 100 each 10  . MAGNESIUM CHLORIDE PO Take 1 tablet by mouth daily.      . meclizine (ANTIVERT) 25 MG tablet TAKE ONE TABLET BY MOUTH THREE TIMES DAILY AS NEEDED FOR DIZZINESS. 90 tablet 0  . omeprazole (PRILOSEC) 20 MG capsule Take 1 capsule (20 mg total) by mouth daily. 90 capsule 3  . ONE TOUCH ULTRA TEST test strip TEST ONCE DAILY AS DIRECTED AS NEEDED TO MONITOR BLOOD GLUCOSE 100 each 0  . potassium gluconate 595 MG TABS tablet Take 595 mg by mouth.     No current facility-administered medications on file prior to visit.   Allergies  Allergen Reactions  . Statins     REACTION: myalgias  . Sulfonamide Derivatives    History  Substance Use Topics  . Smoking status: Current Every Day Smoker    Types: Cigars    Start date: 07/06/1952  . Smokeless tobacco: Never Used     Comment: 2-3 per day; he says "I really don't inhale"  . Alcohol Use: 0.0 oz/week    0 Standard drinks or equivalent per week     Comment: once a month  No changes; he continues to smoke - not interested in stopping.  Family History  Problem Relation Age of Onset  . Diabetes Mother   . Hearing loss Father   . Dementia Father   . Heart attack Father   . Heart attack Brother     ROS: A comprehensive Review of Systems -  Negative except Pertinent positive as above and below Musculoskeletal ROS: positive for - muscle pain and pain in hip - right and leg - right, upper; he also notes positional vertigo with looking from one side the other side wall up underneath a vehicle doing work.  Review of Systems  Constitutional: Negative for malaise/fatigue.  HENT: Negative for nosebleeds.   Respiratory: Negative for shortness of breath.   Cardiovascular: Negative.  Negative for chest pain, claudication and leg swelling.       Just fatigue with exercise  Gastrointestinal: Negative for  blood in stool and melena.  Genitourinary: Negative for hematuria.  Musculoskeletal: Positive for joint pain (hips bursitis - bother him with walking, now to [facility] (R side)).  Neurological: Positive for dizziness (positional - sitting up fast; also some vertigo). Negative for weakness.       Peripheral neuropathy.  Endo/Heme/Allergies: Negative.   Psychiatric/Behavioral: Positive for memory loss (mostly short term loss ).       He does seem now slowed mentation and delay in answering questions  All other systems reviewed and are negative.  Wt Readings from Last 3 Encounters:  07/31/14 100.154 kg (220 lb 12.8 oz)  07/04/14 102.059 kg (225 lb)  01/08/14 100.789 kg (222 lb 3.2 oz)   PHYSICAL EXAM BP 130/78 mmHg  Pulse 42  Ht '5\' 9"'  (1.753 m)  Wt 100.154 kg (220 lb 12.8 oz)  BMI 32.59 kg/m2 General appearance: A&Ox3; NAD. Pleasant mood and affect albeit somewhat blunted. Mildly obese. Well groomed Neck: no adenopathy, no carotid bruit, no JVD, supple, symmetrical, trachea midline and thyroid not enlarged, symmetric, no tenderness/mass/nodules Lungs: CTA B., normal percussion bilaterally and Nonlabored Heart: Regular rhythm with a bradycardia rate; S1, S2 normal, no murmur, click, rub or gallop and normal apical impulse Abdomen: soft, NT/ND; bowel sounds normal; no masses,  no organomegaly and Mild truncal obesity (weight is improved from 216, BMI 32.5) Extremities: extremities normal, atraumatic, no cyanosis or edema and varicose veins noted Pulses: 2+ and symmetric Neurologic: Grossly normal  [id number] today: Yes Rate: 42 , Rhythm: Sinus bradycardia; Left anterior fascicular block None, otherwise normal ECG with no significant change - although the heart rate is slower.  Lab Results  Component Value Date   CHOL 222* 07/04/2014   HDL 41.90 07/04/2014   LDLCALC 143* 07/04/2014   LDLDIRECT 85.5 06/27/2012   TRIG 187.0* 07/04/2014   CHOLHDL 5 07/04/2014     ASSESSMENT /  PLAN:  CAD S/P percutaneous coronary angioplasty - LAD, & RCA x 2 He still remains relatively active but limited by arthritic pains and not any significant angina. Has not had to use when necessary nitroglycerin. He is on a beta blocker due to bradycardia with history of chronotropic competence. He continues to take Plavix without aspirin. He is not currently on an ACE inhibitor or ARB. He is statin intolerant..  Dyslipidemia, goal LDL below 70 His followup lipid labs show a significant increase in LDL and total cholesterol after he stopped the [facility] Trial. With that medication he was doing outstandingly well. He has had intolerance of dysmenorrhea type of statin and is on a omega-3 fatty acid tablet. I will refer him to Tommy Medal - our clinical pharmacist who is currently managing referrals for PC SK 9 inhibitors. She will likely be able to investigate what he was actually taking as part of a trial  Chronotropic incompetence Very bradycardic today on no medications. This probably has little to do with his dizziness and fatigue. As of November 20 14th, he was still able to achieve target heart rate. We'll continue to monitor for worsening symptoms. I did discuss electrical physiology as far as timing for placement of pacemaker and he would not quite meet criteria.  Obesity (BMI 30-39.9) The patient understands the need to lose weight with diet and exercise. We have discussed specific strategies for this.   Vertigo with mild postural lightheadedness. Again much of her symptoms sound like vertigo, however I am reluctant to be aggressive with hypertension management and therefore allow for permissive hypertension. This is one reason why he is not on an ARB/ACE inhibitor.   No orders of the defined types were placed in this encounter.   Follow Up - 6 months.   Joe Murray, M.D., M.S. Interventional Cardiologist   Pager # [phone]

## 2014-08-05 ENCOUNTER — Encounter: Payer: Self-pay | Admitting: Cardiology

## 2014-08-05 NOTE — Assessment & Plan Note (Signed)
His followup lipid labs show a significant increase in LDL and total cholesterol after he stopped the [facility] Trial. With that medication he was doing outstandingly well. He has had intolerance of dysmenorrhea type of statin and is on a omega-3 fatty acid tablet. I will refer him to Tommy Medal - our clinical pharmacist who is currently managing referrals for PC SK 9 inhibitors. She will likely be able to investigate what he was actually taking as part of a trial

## 2014-08-05 NOTE — Assessment & Plan Note (Signed)
The patient understands the need to lose weight with diet and exercise. We have discussed specific strategies for this.  

## 2014-08-05 NOTE — Assessment & Plan Note (Signed)
Very bradycardic today on no medications. This probably has little to do with his dizziness and fatigue. As of November 20 14th, he was still able to achieve target heart rate. We'll continue to monitor for worsening symptoms. I did discuss electrical physiology as far as timing for placement of pacemaker and he would not quite meet criteria.

## 2014-08-05 NOTE — Assessment & Plan Note (Signed)
Again much of her symptoms sound like vertigo, however I am reluctant to be aggressive with hypertension management and therefore allow for permissive hypertension. This is one reason why he is not on an ARB/ACE inhibitor.

## 2014-08-05 NOTE — Assessment & Plan Note (Signed)
He still remains relatively active but limited by arthritic pains and not any significant angina. Has not had to use when necessary nitroglycerin. He is on a beta blocker due to bradycardia with history of chronotropic competence. He continues to take Plavix without aspirin. He is not currently on an ACE inhibitor or ARB. He is statin intolerant.Joe Murray

## 2014-08-21 ENCOUNTER — Ambulatory Visit (INDEPENDENT_AMBULATORY_CARE_PROVIDER_SITE_OTHER): Payer: Medicare HMO | Admitting: Pharmacist Clinician (PhC)/ Clinical Pharmacy Specialist

## 2014-08-21 ENCOUNTER — Encounter: Payer: Self-pay | Admitting: Pharmacist Clinician (PhC)/ Clinical Pharmacy Specialist

## 2014-08-21 VITALS — Ht 69.0 in | Wt 220.0 lb

## 2014-08-21 DIAGNOSIS — E785 Hyperlipidemia, unspecified: Secondary | ICD-10-CM | POA: Diagnosis not present

## 2014-08-21 NOTE — Assessment & Plan Note (Signed)
Since stopping Pharmquest study, LDL has increased and HDL decreased.  He is going to start on Crestor 5 mg once weekly to see if he can tolerate low dose.  He is going to start on CoQ10 today and then start with the Crestor in about 2 weeks.  If all goes well, we can try to increase to twice weekly after 5-6 weeks.  I have asked him to call if he cannot tolerate the Crestor, otherwise we will have him repeat labs in 10-12 weeks

## 2014-08-21 NOTE — Patient Instructions (Signed)
It was nice to meet you today.  Please get some CoQ10 -200 to 300 mcg and take once daily.  After about 2 weeks, start with the Crestor 5 mg only once per week.  If you develop muscle pains or memory loss, stop right away and call me Erasmo Downer at [phone]  Continue to eat a healthy diet.  Eat more oatmeal and fewer sausage biscuits.  Continue to stay active and don't sit around.  Call in 6 weeks if you are tolerating the Crestor and we can send in a prescription for you to continue with the medication.  We will repeat your labs in 10-12 weeks

## 2014-08-21 NOTE — Progress Notes (Signed)
08/21/2014 Joe Murray Aug 18, 1940 [id number]   HPI:  Joe Murray is a 74 y.o. male patient of Dr Ellyn Hack, who presents today for a lipid clinic evaluation.  He was previously involved with a research study at [facility] for a CTEP inhibitor, however this ended in fall of 2015  RF:  MI 10/ 2003, unstable angina 2009, CAD with stents in 11/2001, 04/2007 and 08/2007   Meds: none currently   Intolerant: has had problems with statins in the past, although he only recalls trying simvastatin.  Besides myalgias, it caused him to have memory loss.  I will try to pull his paper chart to see if we can determine which other statins he has tried, and at what doses.   Family history: father had first MI before 40, died from second MI at 65.  2 brothers both deceased from MI's, in their late 31s  Diet:  Eats mostly at home, plenty of vegetables and mostly chicken at dinner; skips lunch most days; breakfast either oatmeal or sausage/egg biscuits  Exercise: nothing specific, spends much of his time working on cars or outside    Labs: 06/2014 - TC 222, TG 187, HDL 41.9, LDL 143  06/2013 - TC 255, TG 129, HDL 131.7, LDL 98 06/2012 - TC 253, TG 112, HDL 132.8, LDL 75   Current Outpatient Prescriptions  Medication Sig Dispense Refill  . Cholecalciferol (VITAMIN D3) 5000 UNITS CAPS Take 5,000 Units by mouth daily. 30 capsule 0  . Cinnamon 500 MG capsule Take 2,000 mg by mouth daily.     . clopidogrel (PLAVIX) 75 MG tablet Take 1 tablet (75 mg total) by mouth daily. 90 tablet 1  . donepezil (ARICEPT) 5 MG tablet TAKE ONE TABLET BY MOUTH AT BEDTIME 90 tablet 0  . fish oil-omega-3 fatty acids 1000 MG capsule Take 2 g by mouth daily.      Marland Kitchen glipiZIDE (GLUCOTROL) 5 MG tablet Take 1 tablet (5 mg total) by mouth 2 (two) times daily before a meal. 180 tablet 3  . Lancets (BD LANCET ULTRAFINE 30G) MISC USE ONE EVERY DAY AS DIRECTED 100 each 10  . MAGNESIUM CHLORIDE PO Take 1 tablet by mouth daily.      .  meclizine (ANTIVERT) 25 MG tablet TAKE ONE TABLET BY MOUTH THREE TIMES DAILY AS NEEDED FOR DIZZINESS. 90 tablet 0  . omeprazole (PRILOSEC) 20 MG capsule Take 1 capsule (20 mg total) by mouth daily. 90 capsule 3  . ONE TOUCH ULTRA TEST test strip TEST ONCE DAILY AS DIRECTED AS NEEDED TO MONITOR BLOOD GLUCOSE 100 each 0  . potassium gluconate 595 MG TABS tablet Take 595 mg by mouth.     No current facility-administered medications for this visit.    Allergies  Allergen Reactions  . Statins     REACTION: myalgias  . Sulfonamide Derivatives     Past Medical History  Diagnosis Date  . Non-STEMI (non-ST elevated myocardial infarction) October 2003    Proximal LAD tandem lesions -- long DES stent covering both  . CAD S/P percutaneous coronary angioplasty 10/'03; 3/'09; 7/*09;      Dr. Ellyn Hack; '03 - Cypher DES 3.0 mm 33 mm proximal-mid LAD (details D1 with ostial 60%); 3/'09 dRCA 2.75 mm x 13 mm Cypher DES (2.8 mm); 7/'09 pRCA 3.0 mm x 18 m Cypher DES (3.25 mm);; (2009) 2D Echo - EF 55%, (November 2014) nonischemic Myoview;;   . SVT (supraventricular tachycardia) 2003  . Persistent sinus bradycardia   .  Chronotropic incompetence 12/2011    For Exertional Dyspnea:  MET Test - EKG-sinus bradycardia, no arrhythmia/ectopy;; treadmill stress test portion of Myoview: Able to achieve 85% maximum heart rate.  . Dyslipidemia, goal LDL below 70     on PharmQuest study medicaion.  . Diabetes mellitus type 2 in obese     CAD  . ED (erectile dysfunction)   . Spasm of esophagus   . Diverticulosis   . Personal history of colonic adenomas 09/06/2012  . Insomnia   . Vertigo, benign positional   . Psoriasis     Resting HR in 54s  . Hemorrhoids 07/2002    Internal and External  . GERD (gastroesophageal reflux disease)     Height '5\' 9"'  (1.753 m), weight 220 lb (99.791 kg).   Tommy Medal PharmD CPP [facility]

## 2014-09-13 ENCOUNTER — Other Ambulatory Visit: Payer: Self-pay | Admitting: Cardiology

## 2014-09-13 ENCOUNTER — Other Ambulatory Visit: Payer: Self-pay | Admitting: Family Medicine

## 2014-09-14 NOTE — Telephone Encounter (Signed)
Ok to refill  [facility]

## 2014-09-14 NOTE — Telephone Encounter (Signed)
Rx(s) sent to pharmacy electronically.  

## 2014-09-18 LAB — HM DIABETES EYE EXAM

## 2014-10-17 ENCOUNTER — Other Ambulatory Visit: Payer: Self-pay | Admitting: Family Medicine

## 2014-10-19 NOTE — Telephone Encounter (Signed)
Increase Aricept to 10 mg daily, call in one year supply

## 2014-10-19 NOTE — Telephone Encounter (Signed)
I spoke with pt and he has tried to increase the dose in the past and it did not do well. Dr. Sarajane Jews is aware of this and I will send in refill as requested.

## 2015-01-01 ENCOUNTER — Other Ambulatory Visit: Payer: Self-pay | Admitting: Family Medicine

## 2015-01-02 ENCOUNTER — Other Ambulatory Visit: Payer: Self-pay

## 2015-01-02 DIAGNOSIS — Z23 Encounter for immunization: Secondary | ICD-10-CM | POA: Diagnosis not present

## 2015-01-02 MED ORDER — GLIPIZIDE 5 MG PO TABS: ORAL_TABLET | ORAL | Status: DC

## 2015-01-09 ENCOUNTER — Other Ambulatory Visit: Payer: Self-pay | Admitting: Cardiology

## 2015-01-09 NOTE — Telephone Encounter (Signed)
Rx request sent to pharmacy.  

## 2015-02-07 ENCOUNTER — Ambulatory Visit: Payer: Medicare HMO | Admitting: Cardiology

## 2015-02-20 ENCOUNTER — Encounter: Payer: Self-pay | Admitting: Cardiology

## 2015-02-20 ENCOUNTER — Ambulatory Visit (INDEPENDENT_AMBULATORY_CARE_PROVIDER_SITE_OTHER): Payer: Medicare HMO | Admitting: Cardiology

## 2015-02-20 VITALS — BP 122/68 | HR 54 | Ht 70.0 in | Wt 224.0 lb

## 2015-02-20 DIAGNOSIS — I2510 Atherosclerotic heart disease of native coronary artery without angina pectoris: Secondary | ICD-10-CM | POA: Diagnosis not present

## 2015-02-20 DIAGNOSIS — R0602 Shortness of breath: Secondary | ICD-10-CM

## 2015-02-20 DIAGNOSIS — R413 Other amnesia: Secondary | ICD-10-CM | POA: Diagnosis not present

## 2015-02-20 DIAGNOSIS — E785 Hyperlipidemia, unspecified: Secondary | ICD-10-CM

## 2015-02-20 DIAGNOSIS — I4589 Other specified conduction disorders: Secondary | ICD-10-CM | POA: Diagnosis not present

## 2015-02-20 DIAGNOSIS — Z9861 Coronary angioplasty status: Secondary | ICD-10-CM

## 2015-02-20 DIAGNOSIS — E669 Obesity, unspecified: Secondary | ICD-10-CM

## 2015-02-20 NOTE — Patient Instructions (Signed)
NO CHANGE WITH CURRENT MEDICATIONS  Your physician recommends that you schedule a follow-up appointment WITH Joe Murray to discuss Joe Murray ( bring a family member with you)   Your physician wants you to follow-up in [place].  You will receive a reminder letter in the mail two months in advance. If you don't receive a letter, please call our office to schedule the follow-up appointment.  If you need a refill on your cardiac medications before your next appointment, please call your pharmacy.

## 2015-02-20 NOTE — Progress Notes (Signed)
Security urinary tract there are no goiter no useful there   Patient ID: Joe Murray, male   DOB: 04-Dec-1940, 74 y.o.   MRN: [id number] PCP: Laurey Morale, MD  Clinic Note: Chief Complaint:  Chief Complaint  Patient presents with  . 6 MONTHS    Patient has no complaints.  . Coronary Artery Disease    HPI: Joe Murray is a 74 y.o. male with a PMH below (CAD PCI to LAD & RCA. MI in 2003 and unstable angina 2009) who presents today for followup for his coronary disease, chronic dyspnea on exertion likely contributed to by chromotropic incompetence demonstrated on CPET-MET testing in early 2014.   He has chronic sinus bradycardia of his had a treadmill stress test on the use he would get short of 85% that was discussed during his last visit. He got his heart rate up to 126 beats/ minute. He stopped because of thigh pain. The imaging portion of the nuclear stress test was negative for ischemia or infarction.  Seen in June then saw Tommy Medal, [facility] for lipid clinic - Lipids worse after stopping PharmQuest trial -- was started onon Crestor. --   (needs repeat labs) Lab Results  Component Value Date   CHOL 222* 07/04/2014   HDL 41.90 07/04/2014   LDLCALC 143* 07/04/2014   LDLDIRECT 85.5 06/27/2012   TRIG 187.0* 07/04/2014   CHOLHDL 5 07/04/2014      Interval History: 5 presents doing relatively well. He really doesn't have any major complaints cardiac standpoint. He says it is mostly right hip and knee pain. This usually keeps her from doing exercise more than any dyspnea work. If he pushes hard he will get short of breath. He still has some vertigo symptoms, no syncope or near syncope.  No TIA or amaurosis fugax. No rapid or irregular heartbeat/palpitations.. He still mostly gets tired relatively quickly, but denies any chest tightness or pressure with exertion. No heart failure symptoms such as PND, orthopnea to one with mild edema. He has occasional "skipped beats" but  doesn't have any sensation of rapid irregular heart beats.  He is mostly limited by his hip and thigh pain.  Cardiovascular ROS: positive for - irregular heartbeat and slow heart rate, DOE with increased level of exertion negative for - chest pain, edema, loss of consciousness, murmur, orthopnea, palpitations, paroxysmal nocturnal dyspnea, rapid heart rate, shortness of breath or TIA/amaurosis fugax, syncope / near syncope  He stopped taking Crestor after a month or 2 because of worsening memory loss. He basically would forget to take the medicine when he was on it.Unfortunately, he did not realize that the intention was for him to continue following up with Erasmo Downer, in order to get established in the evaluation for PC SK9 inhibitor treatment.  Past Medical History  Diagnosis Date  . Non-STEMI (non-ST elevated myocardial infarction) [facility]) October 2003    Proximal LAD tandem lesions -- long DES stent covering both  . CAD S/P percutaneous coronary angioplasty 10/'03; 3/'09; 7/*09;      Dr. Ellyn Hack; '03 - Cypher DES 3.0 mm 33 mm proximal-mid LAD (details D1 with ostial 60%); 3/'09 dRCA 2.75 mm x 13 mm Cypher DES (2.8 mm); 7/'09 pRCA 3.0 mm x 18 m Cypher DES (3.25 mm);; (2009) 2D Echo - EF 55%, (November 2014) nonischemic Myoview;;   . SVT (supraventricular tachycardia) ([place]) 2003  . Persistent sinus bradycardia   . Chronotropic incompetence 12/2011    For Exertional Dyspnea:  MET Test - EKG-sinus bradycardia,  no arrhythmia/ectopy;; treadmill stress test portion of Myoview: Able to achieve 85% maximum heart rate.  . Dyslipidemia, goal LDL below 70     on PharmQuest study medicaion.  . Diabetes mellitus type 2 in obese (HCC)     CAD  . ED (erectile dysfunction)   . Spasm of esophagus   . Diverticulosis   . Personal history of colonic adenomas 09/06/2012  . Insomnia   . Vertigo, benign positional   . Psoriasis     Resting HR in 54s  . Hemorrhoids 07/2002    Internal and External  . GERD  (gastroesophageal reflux disease)    Current Outpatient Prescriptions on File Prior to Visit  Medication Sig Dispense Refill  . Cholecalciferol (VITAMIN D3) 5000 UNITS CAPS Take 5,000 Units by mouth daily. 30 capsule 0  . Cinnamon 500 MG capsule Take 2,000 mg by mouth daily.     . clopidogrel (PLAVIX) 75 MG tablet TAKE 1 TABLET BY MOUTH DAILY 90 tablet 0  . donepezil (ARICEPT) 5 MG tablet TAKE ONE TABLET BY MOUTH AT BEDTIME 90 tablet 3  . fish oil-omega-3 fatty acids 1000 MG capsule Take 2 g by mouth daily.      Marland Kitchen glipiZIDE (GLUCOTROL) 5 MG tablet TAKE ONE TABLET BY MOUTH TWICE DAILY BEFORE A MEAL 180 tablet 3  . Lancets (BD LANCET ULTRAFINE 30G) MISC USE ONE EVERY DAY AS DIRECTED 100 each 10  . MAGNESIUM CHLORIDE PO Take 1 tablet by mouth daily.      . meclizine (ANTIVERT) 25 MG tablet TAKE ONE TABLET BY MOUTH THREE TIMES DAILY AS NEEDED FOR DIZZINESS 90 tablet 0  . omeprazole (PRILOSEC) 20 MG capsule TAKE ONE CAPSULE BY MOUTH ONCE DAILY 90 capsule 1  . ONE TOUCH ULTRA TEST test strip TEST ONCE DAILY AS DIRECTED AS NEEDED TO MONITOR BLOOD GLUCOSE 100 each 0  . potassium gluconate 595 MG TABS tablet Take 595 mg by mouth.     No current facility-administered medications on file prior to visit.   Allergies  Allergen Reactions  . Statins     REACTION: myalgias  . Sulfonamide Derivatives    Social History  Substance Use Topics  . Smoking status: Current Every Day Smoker    Types: Cigars    Start date: 07/06/1952  . Smokeless tobacco: Never Used     Comment: 2-3 per day; he says "I really don't inhale"  . Alcohol Use: 0.0 oz/week    0 Standard drinks or equivalent per week     Comment: once a month  No changes; he continues to smoke - not interested in stopping.  Family History  Problem Relation Age of Onset  . Diabetes Mother   . Hearing loss Father   . Dementia Father   . Heart attack Father   . Heart attack Brother     ROS: A comprehensive Review of Systems -was  performed Review of Systems  Constitutional: Negative for malaise/fatigue.  HENT: Negative for nosebleeds.   Respiratory: Negative for shortness of breath.   Cardiovascular: Negative.  Negative for chest pain, claudication and leg swelling.       Just fatigue with exercise  Gastrointestinal: Negative for blood in stool and melena.  Genitourinary: Negative for hematuria.  Musculoskeletal: Positive for joint pain (hips bursitis - bother him with walking, bilateral. This really limits his level of activity more so than any dyspnea.).       Bilateral upper leg pain (joint and muscle)  Neurological: Positive for dizziness (positional -  sitting up fast; also some vertigo). Negative for weakness.       Peripheral neuropathy.  Endo/Heme/Allergies: Negative.   Psychiatric/Behavioral: Positive for memory loss (mostly short term loss; exacerbated by statin.).       He does seem to have slowed mentation and delay in answering questions  All other systems reviewed and are negative.  Wt Readings from Last 3 Encounters:  02/20/15 224 lb (101.606 kg)  08/21/14 220 lb (99.791 kg)  07/31/14 220 lb 12.8 oz (100.154 kg)   PHYSICAL EXAM BP 122/68 mmHg  Pulse 54  Ht '5\' 10"'  (1.778 m)  Wt 224 lb (101.606 kg)  BMI 32.14 kg/m2 General appearance: A&Ox3; NAD. Pleasant mood and affect albeit somewhat blunted. Mildly obese. Well groomed Neck: no adenopathy, no carotid bruit, no JVD, supple, symmetrical, trachea midline and thyroid not enlarged, symmetric, no tenderness/mass/nodules Lungs: CTA B., normal percussion bilaterally and Nonlabored Heart: Regular rhythm with a bradycardia rate; S1&S2 normal, no murmur, click, rub or gallop and normal apical impulse Abdomen: soft, NT/ND; bowel sounds normal; no masses,  no organomegaly and Mild truncal obesity (weight is improved from 216, BMI 32.5) Extremities: extremities normal, atraumatic, no cyanosis or edema and varicose veins noted Pulses: 2+ and  symmetric Neurologic: Grossly normal  MMH:[id number] today: Yes Rate: 54, Rhythm: Sinus bradycardia with a single PVC; Left anterior fascicular block (-59). Otherwise normal ECG with no significant change - although the heart rate is somewhat faster than last visit.  Lab Results  Component Value Date   CHOL 222* 07/04/2014   HDL 41.90 07/04/2014   LDLCALC 143* 07/04/2014   LDLDIRECT 85.5 06/27/2012   TRIG 187.0* 07/04/2014   CHOLHDL 5 07/04/2014     ASSESSMENT / PLAN: Problem List Items Addressed This Visit    Obesity (BMI 30-39.9) (Chronic)    The patient understands the need to lose weight with diet and exercise. We have discussed specific strategies for this.      Exertional shortness of breath    Probably more related to hip pain and wondered aches and pains than related to cardiac etiology. He certainly has some diastolic dysfunction, and she is even getting to that stage.      Dyslipidemia, goal LDL below 70 (Chronic)    Is significant worsening in stopping for request trial. He did not tolerate even Crestor with worsening memory loss. Currently not taking anything besides visual. I reiterated the importance of staying active in the lipid clinic so that they can work with him to initiate the most effective method of treating. He would likely qualify for PCSK9 inhibitor based on his intolerance to Crestor but that recently.      Relevant Orders   EKG 12-Lead (Completed)   Chronotropic incompetence (Chronic)    He has baseline bradycardia, therefore not on a blocker. I think he has some decreased responsiveness.      Relevant Orders   EKG 12-Lead (Completed)   CAD S/P percutaneous coronary angioplasty - LAD, & RCA x 2 - Primary (Chronic)    He remains relatively asymptomatic at his level of activity. No significant angina with rest or exertion. No recorded use of when necessary nitroglycerin. On Plavix without aspirin. Stat and intolerant. Mostly related to memory loss  as well as myalgias. He is not currently on a beta blocker or ACE inhibitor/ARB and his blood pressure is well controlled. We could consider ARB would be a diabetic, deferred to PCP. We'll need to be little more aggressive with management of his  diabetes.      Relevant Orders   EKG 12-Lead (Completed)    Other Visit Diagnoses    Memory loss        Relevant Orders    EKG 12-Lead (Completed)         No orders of the defined types were placed in this encounter.   NO CHANGE WITH CURRENT MEDICATIONS  Your physician recommends that you schedule a follow-up appointment WITH Erasmo Downer to discuss [place] ( bring a family member with you)   Your physician wants you to follow-up in [place].   Leonie Man, M.D., M.S. Interventional Cardiologist   Pager # [phone]

## 2015-02-22 ENCOUNTER — Encounter: Payer: Self-pay | Admitting: Cardiology

## 2015-02-22 NOTE — Assessment & Plan Note (Signed)
The patient understands the need to lose weight with diet and exercise. We have discussed specific strategies for this.  

## 2015-02-22 NOTE — Assessment & Plan Note (Signed)
He has baseline bradycardia, therefore not on a blocker. I think he has some decreased responsiveness.

## 2015-02-22 NOTE — Assessment & Plan Note (Signed)
He remains relatively asymptomatic at his level of activity. No significant angina with rest or exertion. No recorded use of when necessary nitroglycerin. On Plavix without aspirin. Stat and intolerant. Mostly related to memory loss as well as myalgias. He is not currently on a beta blocker or ACE inhibitor/ARB and his blood pressure is well controlled. We could consider ARB would be a diabetic, deferred to PCP. We'll need to be little more aggressive with management of his diabetes.

## 2015-02-22 NOTE — Assessment & Plan Note (Signed)
Probably more related to hip pain and wondered aches and pains than related to cardiac etiology. He certainly has some diastolic dysfunction, and she is even getting to that stage.

## 2015-02-22 NOTE — Assessment & Plan Note (Signed)
Is significant worsening in stopping for request trial. He did not tolerate even Crestor with worsening memory loss. Currently not taking anything besides visual. I reiterated the importance of staying active in the lipid clinic so that they can work with him to initiate the most effective method of treating. He would likely qualify for PCSK9 inhibitor based on his intolerance to Crestor but that recently.

## 2015-02-24 HISTORY — PX: TRANSTHORACIC ECHOCARDIOGRAM: SHX275

## 2015-02-25 ENCOUNTER — Inpatient Hospital Stay (HOSPITAL_COMMUNITY): Payer: Medicare HMO

## 2015-02-25 ENCOUNTER — Emergency Department (HOSPITAL_COMMUNITY): Payer: Medicare HMO

## 2015-02-25 ENCOUNTER — Inpatient Hospital Stay (HOSPITAL_COMMUNITY)
Admission: EM | Admit: 2015-02-25 | Discharge: 2015-02-27 | DRG: 247 | Disposition: A | Discharge status: Home / Self Care | Payer: Medicare HMO | Attending: Cardiology | Admitting: Cardiology

## 2015-02-25 DIAGNOSIS — Y848 Other medical procedures as the cause of abnormal reaction of the patient, or of later complication, without mention of misadventure at the time of the procedure: Secondary | ICD-10-CM | POA: Diagnosis present

## 2015-02-25 DIAGNOSIS — I25110 Atherosclerotic heart disease of native coronary artery with unstable angina pectoris: Principal | ICD-10-CM | POA: Diagnosis present

## 2015-02-25 DIAGNOSIS — R001 Bradycardia, unspecified: Secondary | ICD-10-CM | POA: Diagnosis not present

## 2015-02-25 DIAGNOSIS — E118 Type 2 diabetes mellitus with unspecified complications: Secondary | ICD-10-CM | POA: Diagnosis present

## 2015-02-25 DIAGNOSIS — F17290 Nicotine dependence, other tobacco product, uncomplicated: Secondary | ICD-10-CM | POA: Diagnosis present

## 2015-02-25 DIAGNOSIS — Y92239 Unspecified place in hospital as the place of occurrence of the external cause: Secondary | ICD-10-CM | POA: Diagnosis not present

## 2015-02-25 DIAGNOSIS — Z889 Allergy status to unspecified drugs, medicaments and biological substances status: Secondary | ICD-10-CM | POA: Diagnosis not present

## 2015-02-25 DIAGNOSIS — I495 Sick sinus syndrome: Secondary | ICD-10-CM | POA: Diagnosis not present

## 2015-02-25 DIAGNOSIS — T82855A Stenosis of coronary artery stent, initial encounter: Secondary | ICD-10-CM | POA: Diagnosis not present

## 2015-02-25 DIAGNOSIS — I119 Hypertensive heart disease without heart failure: Secondary | ICD-10-CM

## 2015-02-25 DIAGNOSIS — Z7984 Long term (current) use of oral hypoglycemic drugs: Secondary | ICD-10-CM | POA: Diagnosis not present

## 2015-02-25 DIAGNOSIS — Y840 Cardiac catheterization as the cause of abnormal reaction of the patient, or of later complication, without mention of misadventure at the time of the procedure: Secondary | ICD-10-CM | POA: Diagnosis not present

## 2015-02-25 DIAGNOSIS — R079 Chest pain, unspecified: Secondary | ICD-10-CM | POA: Diagnosis not present

## 2015-02-25 DIAGNOSIS — K219 Gastro-esophageal reflux disease without esophagitis: Secondary | ICD-10-CM | POA: Diagnosis present

## 2015-02-25 DIAGNOSIS — M79603 Pain in arm, unspecified: Secondary | ICD-10-CM

## 2015-02-25 DIAGNOSIS — R69 Illness, unspecified: Secondary | ICD-10-CM | POA: Diagnosis not present

## 2015-02-25 DIAGNOSIS — Z91128 Patient's intentional underdosing of medication regimen for other reason: Secondary | ICD-10-CM | POA: Diagnosis not present

## 2015-02-25 DIAGNOSIS — Z7902 Long term (current) use of antithrombotics/antiplatelets: Secondary | ICD-10-CM

## 2015-02-25 DIAGNOSIS — T466X6A Underdosing of antihyperlipidemic and antiarteriosclerotic drugs, initial encounter: Secondary | ICD-10-CM | POA: Diagnosis present

## 2015-02-25 DIAGNOSIS — I2510 Atherosclerotic heart disease of native coronary artery without angina pectoris: Secondary | ICD-10-CM

## 2015-02-25 DIAGNOSIS — R0789 Other chest pain: Secondary | ICD-10-CM | POA: Diagnosis not present

## 2015-02-25 DIAGNOSIS — Z6832 Body mass index (BMI) 32.0-32.9, adult: Secondary | ICD-10-CM

## 2015-02-25 DIAGNOSIS — Z955 Presence of coronary angioplasty implant and graft: Secondary | ICD-10-CM

## 2015-02-25 DIAGNOSIS — I252 Old myocardial infarction: Secondary | ICD-10-CM | POA: Diagnosis not present

## 2015-02-25 DIAGNOSIS — I200 Unstable angina: Secondary | ICD-10-CM | POA: Diagnosis not present

## 2015-02-25 DIAGNOSIS — I10 Essential (primary) hypertension: Secondary | ICD-10-CM | POA: Diagnosis present

## 2015-02-25 DIAGNOSIS — Z79899 Other long term (current) drug therapy: Secondary | ICD-10-CM

## 2015-02-25 DIAGNOSIS — L7632 Postprocedural hematoma of skin and subcutaneous tissue following other procedure: Secondary | ICD-10-CM | POA: Diagnosis not present

## 2015-02-25 DIAGNOSIS — E785 Hyperlipidemia, unspecified: Secondary | ICD-10-CM | POA: Diagnosis not present

## 2015-02-25 DIAGNOSIS — N1831 Chronic kidney disease, stage 3a: Secondary | ICD-10-CM | POA: Diagnosis present

## 2015-02-25 DIAGNOSIS — I214 Non-ST elevation (NSTEMI) myocardial infarction: Secondary | ICD-10-CM

## 2015-02-25 DIAGNOSIS — Z9861 Coronary angioplasty status: Secondary | ICD-10-CM

## 2015-02-25 DIAGNOSIS — F17210 Nicotine dependence, cigarettes, uncomplicated: Secondary | ICD-10-CM | POA: Diagnosis not present

## 2015-02-25 DIAGNOSIS — J811 Chronic pulmonary edema: Secondary | ICD-10-CM | POA: Diagnosis not present

## 2015-02-25 LAB — COMPREHENSIVE METABOLIC PANEL
ALBUMIN: 3.1 g/dL — AB (ref 3.5–5.0)
ALT: 14 U/L — ABNORMAL LOW (ref 17–63)
ANION GAP: 10 (ref 5–15)
AST: 16 U/L (ref 15–41)
Alkaline Phosphatase: 50 U/L (ref 38–126)
BUN: 15 mg/dL (ref 6–20)
CHLORIDE: 104 mmol/L (ref 101–111)
CO2: 23 mmol/L (ref 22–32)
Calcium: 9 mg/dL (ref 8.9–10.3)
Creatinine, Ser: 1.01 mg/dL (ref 0.61–1.24)
GFR calc Af Amer: >60 mL/min (ref >60)
GFR calc non Af Amer: >60 mL/min (ref >60)
GLUCOSE: 125 mg/dL — AB (ref 65–99)
POTASSIUM: 4.4 mmol/L (ref 3.5–5.1)
Sodium: 137 mmol/L (ref 135–145)
TOTAL PROTEIN: 5.7 g/dL — AB (ref 6.5–8.1)
Total Bilirubin: 0.7 mg/dL (ref 0.3–1.2)

## 2015-02-25 LAB — CBC WITH DIFFERENTIAL/PLATELET
BASOS ABS: 0 10*3/uL (ref 0.0–0.1)
BASOS PCT: 0 %
EOS ABS: 0.4 10*3/uL (ref 0.0–0.7)
EOS PCT: 5 %
HCT: 37.9 % — ABNORMAL LOW (ref 39.0–52.0)
Hemoglobin: 12.9 g/dL — ABNORMAL LOW (ref 13.0–17.0)
Lymphocytes Relative: 30 %
Lymphs Abs: 2.2 10*3/uL (ref 0.7–4.0)
MCH: 30.9 pg (ref 26.0–34.0)
MCHC: 34 g/dL (ref 30.0–36.0)
MCV: 90.7 fL (ref 78.0–100.0)
MONO ABS: 0.7 10*3/uL (ref 0.1–1.0)
MONOS PCT: 9 %
NEUTROS ABS: 4.1 10*3/uL (ref 1.7–7.7)
Neutrophils Relative %: 56 %
PLATELETS: 193 10*3/uL (ref 150–400)
RBC: 4.18 MIL/uL — ABNORMAL LOW (ref 4.22–5.81)
RDW: 12.5 % (ref 11.5–15.5)
WBC: 7.3 10*3/uL (ref 4.0–10.5)

## 2015-02-25 LAB — TROPONIN I
Troponin I: 0.18 ng/mL — ABNORMAL HIGH (ref <0.031)
Troponin I: 0.25 ng/mL — ABNORMAL HIGH (ref <0.031)

## 2015-02-25 LAB — I-STAT TROPONIN, ED: Troponin i, poc: 0.07 ng/mL (ref 0.00–0.08)

## 2015-02-25 LAB — GLUCOSE, CAPILLARY
Glucose-Capillary: 214 mg/dL — ABNORMAL HIGH (ref 65–99)
Glucose-Capillary: 108 mg/dL — ABNORMAL HIGH (ref 65–99)

## 2015-02-25 LAB — HEPARIN LEVEL (UNFRACTIONATED): Heparin Unfractionated: 0.77 IU/mL — ABNORMAL HIGH (ref 0.30–0.70)

## 2015-02-25 MED ORDER — SODIUM CHLORIDE 0.9 % IJ SOLN: 3.0000 mL | Freq: Two times a day (BID) | INTRAMUSCULAR | Status: DC

## 2015-02-25 MED ORDER — ASPIRIN EC 81 MG PO TBEC
81.0000 mg | DELAYED_RELEASE_TABLET | Freq: Every day | ORAL | Status: DC
  Administered 2015-02-25 – 2015-02-27 (×3): 81 mg via ORAL
  Filled 2015-02-25 (×3): qty 1

## 2015-02-25 MED ORDER — ALPRAZOLAM 0.25 MG PO TABS: 0.2500 mg | ORAL_TABLET | Freq: Two times a day (BID) | ORAL | Status: DC | PRN

## 2015-02-25 MED ORDER — DONEPEZIL HCL 5 MG PO TABS
5.0000 mg | ORAL_TABLET | Freq: Every day | ORAL | Status: DC
  Administered 2015-02-25 – 2015-02-26 (×2): 5 mg via ORAL
  Filled 2015-02-25 (×3): qty 1

## 2015-02-25 MED ORDER — NITROGLYCERIN 0.4 MG SL SUBL: 0.4000 mg | SUBLINGUAL_TABLET | SUBLINGUAL | Status: DC | PRN

## 2015-02-25 MED ORDER — INSULIN ASPART 100 UNIT/ML ~~LOC~~ SOLN
0.0000 [IU] | Freq: Three times a day (TID) | SUBCUTANEOUS | Status: DC
  Administered 2015-02-25: 5 [IU] via SUBCUTANEOUS
  Administered 2015-02-26 (×2): 3 [IU] via SUBCUTANEOUS
  Administered 2015-02-26 – 2015-02-27 (×2): 2 [IU] via SUBCUTANEOUS

## 2015-02-25 MED ORDER — ACETAMINOPHEN 325 MG PO TABS
650.0000 mg | ORAL_TABLET | ORAL | Status: DC | PRN
  Administered 2015-02-26 (×2): 650 mg via ORAL
  Filled 2015-02-25 (×2): qty 2

## 2015-02-25 MED ORDER — PANTOPRAZOLE SODIUM 40 MG PO TBEC
40.0000 mg | DELAYED_RELEASE_TABLET | Freq: Every day | ORAL | Status: DC
  Administered 2015-02-25 – 2015-02-27 (×3): 40 mg via ORAL
  Filled 2015-02-25 (×3): qty 1

## 2015-02-25 MED ORDER — ONDANSETRON HCL 4 MG/2ML IJ SOLN: 4.0000 mg | Freq: Four times a day (QID) | INTRAMUSCULAR | Status: DC | PRN

## 2015-02-25 MED ORDER — HEPARIN (PORCINE) IN NACL 100-0.45 UNIT/ML-% IJ SOLN
14.0000 [IU]/kg/h | INTRAMUSCULAR | Status: DC
  Administered 2015-02-25: 14 [IU]/kg/h via INTRAVENOUS
  Filled 2015-02-25: qty 250

## 2015-02-25 MED ORDER — SODIUM CHLORIDE 0.9 % IV SOLN: 250.0000 mL | INTRAVENOUS | Status: DC | PRN

## 2015-02-25 MED ORDER — ZOLPIDEM TARTRATE 5 MG PO TABS
5.0000 mg | ORAL_TABLET | Freq: Every evening | ORAL | Status: DC | PRN
  Administered 2015-02-26: 5 mg via ORAL
  Filled 2015-02-25: qty 1

## 2015-02-25 MED ORDER — INSULIN ASPART 100 UNIT/ML ~~LOC~~ SOLN: 0.0000 [IU] | Freq: Every day | SUBCUTANEOUS | Status: DC

## 2015-02-25 MED ORDER — AMLODIPINE BESYLATE 5 MG PO TABS
2.5000 mg | ORAL_TABLET | Freq: Every day | ORAL | Status: DC
  Administered 2015-02-25 – 2015-02-27 (×3): 2.5 mg via ORAL
  Filled 2015-02-25 (×3): qty 1

## 2015-02-25 MED ORDER — SODIUM CHLORIDE 0.9 % IJ SOLN: 3.0000 mL | INTRAMUSCULAR | Status: DC | PRN

## 2015-02-25 MED ORDER — OMEGA-3-ACID ETHYL ESTERS 1 G PO CAPS
2.0000 g | ORAL_CAPSULE | Freq: Every day | ORAL | Status: DC
  Administered 2015-02-26 – 2015-02-27 (×2): 2 g via ORAL
  Filled 2015-02-25 (×2): qty 2

## 2015-02-25 MED ORDER — HEPARIN BOLUS VIA INFUSION
4000.0000 [IU] | Freq: Once | INTRAVENOUS | Status: AC
  Administered 2015-02-25: 4000 [IU] via INTRAVENOUS
  Filled 2015-02-25: qty 4000

## 2015-02-25 MED ORDER — SODIUM CHLORIDE 0.9 % WEIGHT BASED INFUSION
1.0000 mL/kg/h | INTRAVENOUS | Status: DC
  Administered 2015-02-26 (×2): 1 mL/kg/h via INTRAVENOUS

## 2015-02-25 MED ORDER — HEPARIN SODIUM (PORCINE) 5000 UNIT/ML IJ SOLN: 5000.0000 [IU] | Freq: Three times a day (TID) | INTRAMUSCULAR | Status: DC

## 2015-02-25 MED ORDER — GLIPIZIDE 5 MG PO TABS
5.0000 mg | ORAL_TABLET | Freq: Two times a day (BID) | ORAL | Status: DC
Start: 2015-02-25 — End: 2015-02-27
  Administered 2015-02-27: 07:00:00 5 mg via ORAL
  Filled 2015-02-25 (×4): qty 1

## 2015-02-25 MED ORDER — NITROGLYCERIN 2 % TD OINT
0.5000 [in_us] | TOPICAL_OINTMENT | Freq: Four times a day (QID) | TRANSDERMAL | Status: DC
  Administered 2015-02-25 – 2015-02-26 (×4): 0.5 [in_us] via TOPICAL
  Filled 2015-02-25: qty 30

## 2015-02-25 MED ORDER — CLOPIDOGREL BISULFATE 75 MG PO TABS
75.0000 mg | ORAL_TABLET | Freq: Every day | ORAL | Status: DC
  Administered 2015-02-25 – 2015-02-26 (×2): 75 mg via ORAL
  Filled 2015-02-25 (×2): qty 1

## 2015-02-25 MED ORDER — SODIUM CHLORIDE 0.9 % IJ SOLN
3.0000 mL | Freq: Two times a day (BID) | INTRAMUSCULAR | Status: DC
  Administered 2015-02-25 – 2015-02-27 (×2): 3 mL via INTRAVENOUS

## 2015-02-25 MED ORDER — HEPARIN (PORCINE) IN NACL 100-0.45 UNIT/ML-% IJ SOLN
1200.0000 [IU]/h | INTRAMUSCULAR | Status: DC
  Administered 2015-02-26: 1200 [IU]/h via INTRAVENOUS
  Filled 2015-02-25: qty 250

## 2015-02-25 NOTE — Progress Notes (Signed)
*  PRELIMINARY RESULTS* Echocardiogram 2D Echocardiogram has been performed.  Leavy Cella 02/25/2015, 1:53 PM

## 2015-02-25 NOTE — Progress Notes (Signed)
ANTICOAGULATION CONSULT NOTE - Follow Up Consult  Pharmacy Consult for Heparin Indication: chest pain/ACS   Allergies  Allergen Reactions  . Statins     REACTION: myalgias  . Sulfonamide Derivatives     unknown    Patient Measurements: Height: _0  (177.8 cm) Weight: 224 lb 1.6 oz (101.651 kg) IBW/kg (Calculated) : 73 Heparin Dosing Weight: 94.4 kg   Vital Signs: Temp: 98.7 F (37.1 C) (01/02 2045) Temp Source: Oral (01/02 2045) BP: 115/53 mmHg (01/02 2045) Pulse Rate: 49 (01/02 2045)  Labs:  Recent Labs  02/25/15 0521 02/25/15 1340 02/25/15 1854 02/25/15 2219  HGB 12.9*  --   --   --   HCT 37.9*  --   --   --   PLT 193  --   --   --   HEPARINUNFRC  --   --   --  0.77*  CREATININE 1.01  --   --   --   TROPONINI  --  0.18* 0.25*  --     Estimated Creatinine Clearance: 75.5 mL/min (by C-G formula based on Cr of 1.01).   Medical History: Past Medical History  Diagnosis Date  . Non-STEMI (non-ST elevated myocardial infarction) [facility]) October 2003    Proximal LAD tandem lesions -- long DES stent covering both  . CAD S/P percutaneous coronary angioplasty 10/'03; 3/'09; 7/*09;      Dr. Ellyn Hack; '03 - Cypher DES 3.0 mm 33 mm proximal-mid LAD (details D1 with ostial 60%); 3/'09 dRCA 2.75 mm x 13 mm Cypher DES (2.8 mm); 7/'09 pRCA 3.0 mm x 18 m Cypher DES (3.25 mm);; (2009) 2D Echo - EF 55%, (November 2014) nonischemic Myoview;;   . SVT (supraventricular tachycardia) ([place]) 2003  . Persistent sinus bradycardia   . Chronotropic incompetence 12/2011    For Exertional Dyspnea:  MET Test - EKG-sinus bradycardia, no arrhythmia/ectopy;; treadmill stress test portion of Myoview: Able to achieve 85% maximum heart rate.  . Dyslipidemia, goal LDL below 70     on PharmQuest study medicaion.  . Diabetes mellitus type 2 in obese (HCC)     CAD  . ED (erectile dysfunction)   . Spasm of esophagus   . Diverticulosis   . Personal history of colonic adenomas 09/06/2012  . Insomnia    . Vertigo, benign positional   . Psoriasis     Resting HR in 54s  . Hemorrhoids 07/2002    Internal and External  . GERD (gastroesophageal reflux disease)     Medications:  Prescriptions prior to admission  Medication Sig Dispense Refill Last Dose  . Cholecalciferol (VITAMIN D3) 5000 UNITS CAPS Take 5,000 Units by mouth daily. 30 capsule 0 02/24/2015 at Unknown time  . Cinnamon 500 MG capsule Take 2,000 mg by mouth daily.    02/24/2015 at Unknown time  . clopidogrel (PLAVIX) 75 MG tablet TAKE 1 TABLET BY MOUTH DAILY 90 tablet 0 02/24/2015 at Unknown time  . donepezil (ARICEPT) 5 MG tablet TAKE ONE TABLET BY MOUTH AT BEDTIME 90 tablet 3 02/24/2015 at Unknown time  . fish oil-omega-3 fatty acids 1000 MG capsule Take 2 g by mouth daily.     02/24/2015 at Unknown time  . glipiZIDE (GLUCOTROL) 5 MG tablet TAKE ONE TABLET BY MOUTH TWICE DAILY BEFORE A MEAL 180 tablet 3 02/24/2015 at Unknown time  . MAGNESIUM CHLORIDE PO Take 1 tablet by mouth daily.     02/24/2015 at Unknown time  . meclizine (ANTIVERT) 25 MG tablet TAKE ONE TABLET BY  MOUTH THREE TIMES DAILY AS NEEDED FOR DIZZINESS 90 tablet 0 Past Week at Unknown time  . omeprazole (PRILOSEC) 20 MG capsule TAKE ONE CAPSULE BY MOUTH ONCE DAILY 90 capsule 1 02/24/2015 at Unknown time  . potassium gluconate 595 MG TABS tablet Take 595 mg by mouth.   02/24/2015 at Unknown time  . Lancets (BD LANCET ULTRAFINE 30G) MISC USE ONE EVERY DAY AS DIRECTED 100 each 10 unknown  . ONE TOUCH ULTRA TEST test strip TEST ONCE DAILY AS DIRECTED AS NEEDED TO MONITOR BLOOD GLUCOSE 100 each 0 unknown   Scheduled:  . amLODipine  2.5 mg Oral Daily  . [START ON 02/26/2015] aspirin EC  81 mg Oral Daily  . [START ON 02/26/2015] clopidogrel  75 mg Oral Daily  . donepezil  5 mg Oral QHS  . glipiZIDE  5 mg Oral BID AC  . insulin aspart  0-15 Units Subcutaneous TID WC  . insulin aspart  0-5 Units Subcutaneous QHS  . nitroGLYCERIN  0.5 inch Topical 4 times per day  . [START ON 02/26/2015]  omega-3 acid ethyl esters  2 g Oral Daily  . [START ON 02/26/2015] pantoprazole  40 mg Oral Daily  . sodium chloride  3 mL Intravenous Q12H  . sodium chloride  3 mL Intravenous Q12H    Assessment: 75 y.o. male with a PMHx of CAD PCI to LAD & RCA (2003 and 2009).  Pharmacy consulted to start IV heparin infusion for unstable angina. Plan for cardiac cath tomorrow.  Troponin 0.25.  Hgb 12.9 , pltc wnl. RN reports no s/s of bleeding.  Initial f/u HL is supra-therapeutic at 0.77.    Goal of Therapy:  Heparin level 0.3-0.7 units/ml Monitor platelets by anticoagulation protocol: Yes   Plan:  Decrease heparin infusion to 1200 units/hr  Heparin level in 8 hours  Daily heparin level and CBC.  Albertina Parr, PharmD., BCPS Clinical Pharmacist Pager [phone]

## 2015-02-25 NOTE — Progress Notes (Signed)
ANTICOAGULATION CONSULT NOTE - Initial Consult  Pharmacy Consult for Heparin Indication: chest pain/ACS   Allergies  Allergen Reactions  . Statins     REACTION: myalgias  . Sulfonamide Derivatives     unknown    Patient Measurements: Height: 5' 10" (177.8 cm) Weight: 224 lb 1.6 oz (101.651 kg) IBW/kg (Calculated) : 73 Heparin Dosing Weight: 94.4 kg   Vital Signs: Temp: 98 F (36.7 C) (01/02 1228) Temp Source: Oral (01/02 1228) BP: 151/73 mmHg (01/02 1228) Pulse Rate: 49 (01/02 1228)  Labs:  Recent Labs  02/25/15 0521  HGB 12.9*  HCT 37.9*  PLT 193  CREATININE 1.01    Estimated Creatinine Clearance: 75.5 mL/min (by C-G formula based on Cr of 1.01).   Medical History: Past Medical History  Diagnosis Date  . Non-STEMI (non-ST elevated myocardial infarction) [facility]) October 2003    Proximal LAD tandem lesions -- long DES stent covering both  . CAD S/P percutaneous coronary angioplasty 10/'03; 3/'09; 7/*09;      Dr. Ellyn Hack; '03 - Cypher DES 3.0 mm 33 mm proximal-mid LAD (details D1 with ostial 60%); 3/'09 dRCA 2.75 mm x 13 mm Cypher DES (2.8 mm); 7/'09 pRCA 3.0 mm x 18 m Cypher DES (3.25 mm);; (2009) 2D Echo - EF 55%, (November 2014) nonischemic Myoview;;   . SVT (supraventricular tachycardia) ([place]) 2003  . Persistent sinus bradycardia   . Chronotropic incompetence 12/2011    For Exertional Dyspnea:  MET Test - EKG-sinus bradycardia, no arrhythmia/ectopy;; treadmill stress test portion of Myoview: Able to achieve 85% maximum heart rate.  . Dyslipidemia, goal LDL below 70     on [employer] study medicaion.  . Diabetes mellitus type 2 in obese (HCC)     CAD  . ED (erectile dysfunction)   . Spasm of esophagus   . Diverticulosis   . Personal history of colonic adenomas 09/06/2012  . Insomnia   . Vertigo, benign positional   . Psoriasis     Resting HR in 54s  . Hemorrhoids 07/2002    Internal and External  . GERD (gastroesophageal reflux disease)      Medications:  Prescriptions prior to admission  Medication Sig Dispense Refill Last Dose  . Cholecalciferol (VITAMIN D3) 5000 UNITS CAPS Take 5,000 Units by mouth daily. 30 capsule 0 02/24/2015 at Unknown time  . Cinnamon 500 MG capsule Take 2,000 mg by mouth daily.    02/24/2015 at Unknown time  . clopidogrel (PLAVIX) 75 MG tablet TAKE 1 TABLET BY MOUTH DAILY 90 tablet 0 02/24/2015 at Unknown time  . donepezil (ARICEPT) 5 MG tablet TAKE ONE TABLET BY MOUTH AT BEDTIME 90 tablet 3 02/24/2015 at Unknown time  . fish oil-omega-3 fatty acids 1000 MG capsule Take 2 g by mouth daily.     02/24/2015 at Unknown time  . glipiZIDE (GLUCOTROL) 5 MG tablet TAKE ONE TABLET BY MOUTH TWICE DAILY BEFORE A MEAL 180 tablet 3 02/24/2015 at Unknown time  . MAGNESIUM CHLORIDE PO Take 1 tablet by mouth daily.     02/24/2015 at Unknown time  . meclizine (ANTIVERT) 25 MG tablet TAKE ONE TABLET BY MOUTH THREE TIMES DAILY AS NEEDED FOR DIZZINESS 90 tablet 0 Past Week at Unknown time  . omeprazole (PRILOSEC) 20 MG capsule TAKE ONE CAPSULE BY MOUTH ONCE DAILY 90 capsule 1 02/24/2015 at Unknown time  . potassium gluconate 595 MG TABS tablet Take 595 mg by mouth.   02/24/2015 at Unknown time  . Lancets (BD LANCET ULTRAFINE 30G) MISC USE  ONE EVERY DAY AS DIRECTED 100 each 10 unknown  . ONE TOUCH ULTRA TEST test strip TEST ONCE DAILY AS DIRECTED AS NEEDED TO MONITOR BLOOD GLUCOSE 100 each 0 unknown   Scheduled:  . amLODipine  2.5 mg Oral Daily  . [START ON 02/26/2015] aspirin EC  81 mg Oral Daily  . [START ON 02/26/2015] clopidogrel  75 mg Oral Daily  . donepezil  5 mg Oral QHS  . glipiZIDE  5 mg Oral BID AC  . heparin  5,000 Units Subcutaneous 3 times per day  . insulin aspart  0-15 Units Subcutaneous TID WC  . insulin aspart  0-5 Units Subcutaneous QHS  . [START ON 02/26/2015] omega-3 acid ethyl esters  2 g Oral Daily  . [START ON 02/26/2015] pantoprazole  40 mg Oral Daily  . sodium chloride  3 mL Intravenous Q12H    Assessment: 75  y.o. male with a PMHx of CAD PCI to LAD & RCA (2003 and 2009).  Pharmacy consulted to start IV heparin infusion for unstable angina. Plan for cardiac cath tomorrow.  Troponin is negative x1.  Hgb 12.9 , pltc wnl.  No bleeding noted.   Goal of Therapy:  Heparin level 0.3-0.7 units/ml Monitor platelets by anticoagulation protocol: Yes   Plan:  Give Heparin bolus of 4000 units x1 then heparin drip at 1300 units/hr Heparin level in 8 hours  Daily heparin level and CBC.   , RPh Clinical Pharmacist Pager: [phone] 02/25/2015,1:46 PM   

## 2015-02-25 NOTE — ED Notes (Signed)
Pt resting quietly at the time. Denies chest pain. Vital signs stable. NAD. Wife at bedside.

## 2015-02-25 NOTE — ED Provider Notes (Signed)
9:15 AM D/w Dr. Meda Coffee, she will come evaluate.  11:06 AM Dr. Meda Coffee to admit, likely cath tomorrow  Sherwood Gambler, MD 02/25/15 1106

## 2015-02-25 NOTE — H&P (Addendum)
Patient ID: BRENDT DIBLE MRN: [id number], DOB/AGE: 1940/10/19   Admit date: 02/25/2015   Primary Physician: Laurey Morale, MD Primary Cardiologist: Dr Ellyn Hack  Pt. Profile:  Chest pain, h/o MI x 1  Problem List  Past Medical History  Diagnosis Date  . Non-STEMI (non-ST elevated myocardial infarction) [facility]) October 2003    Proximal LAD tandem lesions -- long DES stent covering both  . CAD S/P percutaneous coronary angioplasty 10/'03; 3/'09; 7/*09;      Dr. Ellyn Hack; '03 - Cypher DES 3.0 mm 33 mm proximal-mid LAD (details D1 with ostial 60%); 3/'09 dRCA 2.75 mm x 13 mm Cypher DES (2.8 mm); 7/'09 pRCA 3.0 mm x 18 m Cypher DES (3.25 mm);; (2009) 2D Echo - EF 55%, (November 2014) nonischemic Myoview;;   . SVT (supraventricular tachycardia) ([place]) 2003  . Persistent sinus bradycardia   . Chronotropic incompetence 12/2011    For Exertional Dyspnea:  MET Test - EKG-sinus bradycardia, no arrhythmia/ectopy;; treadmill stress test portion of Myoview: Able to achieve 85% maximum heart rate.  . Dyslipidemia, goal LDL below 70     on [employer] study medicaion.  . Diabetes mellitus type 2 in obese (HCC)     CAD  . ED (erectile dysfunction)   . Spasm of esophagus   . Diverticulosis   . Personal history of colonic adenomas 09/06/2012  . Insomnia   . Vertigo, benign positional   . Psoriasis     Resting HR in 54s  . Hemorrhoids 07/2002    Internal and External  . GERD (gastroesophageal reflux disease)     Past Surgical History  Procedure Laterality Date  . Shoulder surgery      left rotator cuff, Dr. Gladstone Lighter  . Wrist fracture surgery      right  . Coronary angioplasty with stent placement  LAD - 2003, RCA 3 & 7/ '09    Cypher 3.0 x 32 mid LAD; Cyper 2.75 x 13 - distal RCA, 3.0 x 18 Prox RCA  . Esophagogastroduodenoscopy  08-11-02    esophageal dilation per Dr. Carlean Purl  . Colonoscopy  08-31-12    per Dr. Carlean Purl, adenomatous polyps, repeat in 3 years   . Cataract extraction   [id number]  . Hernia repair      right inguinal   . Percutaneous coronary stent intervention (pci-s)  11/23/2001    NSTEMI: Prox-Mid LAD tandem ~80% lesions on either side of D1 (with 80% lesion) -- Cypher DES 3.0 mm x 33 m (covering both lesions)   . Percutaneous coronary stent intervention (pci-s)  04/27/2007    Unstable Angina: Distal RCA 95%: Cypher 2.75x13  (2.8 mm); residual focal ~60%ISR in LAD stent, 60% D1 ostial (no PTCA on LAD due to no ischemia on ST)  . Percutaneous coronary stent intervention (pci-s)  09/21/2007    Bradycardia & Unstable Angina: Prox RCA 70% - PCI Cypher DES 3.0 mm x 18 mm  (3.25 mm); ostial 60-70% jailed D1. LAD & distal RCA stents patent  . Nm myoview ltd  Nov 2014    ~8 METS; EF 60%, no ischemia or infarction  . Transthoracic echocardiogram  March 2009    Normal LV size and function, EF 55%. Mild MR; mild RV dilation.     Allergies  Allergies  Allergen Reactions  . Statins     REACTION: myalgias  . Sulfonamide Derivatives     unknown    HPI  Patient is a 75 y.o. male with a PMHx of CAD PCI to  LAD & RCA. MI in 2003 and unstable angina 2009, who follow with Dr Ellyn Hack, last seen just last week when he was doing well. He presented today with retrosternal chest pain that is typical for him and the same as with the last two events (MI and UA). It feels like indigestion with some radiation to his left arm.  His last stress test was in 2014 but it was non-diagnostic as he didn't reach [facility]. The imaging portion of the nuclear stress test was negative for ischemia or infarction. Seen in June then saw Tommy Medal, [facility] for lipid clinic - Lipids worse after stopping Pharm[employer] trial -- was started on Crestor.  His pain continues in the ER, on and off and is not relieved by GI cocktail. Some relief with sl NTG.   Home Medications  Prior to Admission medications   Medication Sig Start Date End Date Taking? Authorizing Provider  Cholecalciferol (VITAMIN D3) 5000  UNITS CAPS Take 5,000 Units by mouth daily. 06/25/11  Yes Laurey Morale, MD  Cinnamon 500 MG capsule Take 2,000 mg by mouth daily.    Yes Historical Provider, MD  clopidogrel (PLAVIX) 75 MG tablet TAKE 1 TABLET BY MOUTH DAILY 01/09/15  Yes Leonie Man, MD  donepezil (ARICEPT) 5 MG tablet TAKE ONE TABLET BY MOUTH AT BEDTIME 10/19/14  Yes Laurey Morale, MD  fish oil-omega-3 fatty acids 1000 MG capsule Take 2 g by mouth daily.     Yes Historical Provider, MD  glipiZIDE (GLUCOTROL) 5 MG tablet TAKE ONE TABLET BY MOUTH TWICE DAILY BEFORE A MEAL 01/02/15  Yes Laurey Morale, MD  MAGNESIUM CHLORIDE PO Take 1 tablet by mouth daily.     Yes Historical Provider, MD  meclizine (ANTIVERT) 25 MG tablet TAKE ONE TABLET BY MOUTH THREE TIMES DAILY AS NEEDED FOR DIZZINESS 09/14/14  Yes Leonie Man, MD  omeprazole (PRILOSEC) 20 MG capsule TAKE ONE CAPSULE BY MOUTH ONCE DAILY 09/14/14  Yes Laurey Morale, MD  potassium gluconate 595 MG TABS tablet Take 595 mg by mouth.   Yes Historical Provider, MD  Lancets (BD LANCET ULTRAFINE 30G) MISC USE ONE EVERY DAY AS DIRECTED 10/29/10   Laurey Morale, MD  ONE [facility] ULTRA TEST test strip TEST ONCE DAILY AS DIRECTED AS NEEDED TO MONITOR BLOOD GLUCOSE 03/30/12   Laurey Morale, MD   Family History  Family History  Problem Relation Age of Onset  . Diabetes Mother   . Hearing loss Father   . Dementia Father   . Heart attack Father   . Heart attack Brother    Social History  Social History   Social History  . Marital Status: Married    Spouse Name: N/A  . Number of Children: 4  . Years of Education: N/A   Occupational History  . retired    Social History Main Topics  . Smoking status: Current Every Day Smoker    Types: Cigars    Start date: 07/06/1952  . Smokeless tobacco: Never Used     Comment: 2-3 per day; he says "I really don't inhale"  . Alcohol Use: 0.0 oz/week    0 Standard drinks or equivalent per week     Comment: once a month  . Drug Use: No  .  Sexual Activity: Not on file   Other Topics Concern  . Not on file   Social History Narrative   He is a married, father of 62, grandfather 3.   He still  smokes 2-3 cigars per day. States that he "does not really inhale ". He is not really all that it's including, stating that he wants to have his 1 remaining vice.   Exercises only on occasion.   He does various landscaping jobs including cutting wood, and clearing brush.    Review of Systems General:  No chills, fever, night sweats or weight changes.  Cardiovascular:  No chest pain, dyspnea on exertion, edema, orthopnea, palpitations, paroxysmal nocturnal dyspnea. Dermatological: No rash, lesions/masses Respiratory: No cough, dyspnea Urologic: No hematuria, dysuria Abdominal:   No nausea, vomiting, diarrhea, bright red blood per rectum, melena, or hematemesis Neurologic:  No visual changes, wkns, changes in mental status. All other systems reviewed and are otherwise negative except as noted above.  Physical Exam  Blood pressure 151/73, pulse 49, temperature 98 F (36.7 C), temperature source Oral, resp. rate 16, height '5\' 10"'  (1.778 m), weight 224 lb 1.6 oz (101.651 kg), SpO2 98 %.  General: Pleasant, NAD Psych: Normal affect. Neuro: Alert and oriented X 3. Moves all extremities spontaneously. HEENT: Normal  Neck: Supple without bruits or JVD. Lungs:  Resp regular and unlabored, CTA. Heart: RRR no s3, s4, or murmurs. Abdomen: Soft, non-tender, non-distended, BS + x 4.  Extremities: No clubbing, cyanosis or edema. DP/PT/Radials 2+ and equal bilaterally.  Labs  No results for input(s): CKTOTAL, CKMB, TROPONINI in the last 72 hours. Lab Results  Component Value Date   WBC 7.3 02/25/2015   HGB 12.9* 02/25/2015   HCT 37.9* 02/25/2015   MCV 90.7 02/25/2015   PLT 193 02/25/2015    Recent Labs Lab 02/25/15 0521  NA 137  K 4.4  CL 104  CO2 23  BUN 15  CREATININE 1.01  CALCIUM 9.0  PROT 5.7*  BILITOT 0.7  ALKPHOS 50    ALT 14*  AST 16  GLUCOSE 125*   Lab Results  Component Value Date   CHOL 222* 07/04/2014   HDL 41.90 07/04/2014   LDLCALC 143* 07/04/2014   TRIG 187.0* 07/04/2014   No results found for: DDIMER Invalid input(s): POCBNP   Radiology/Studies  Dg Chest 2 View  02/25/2015  CLINICAL DATA:  Acute onset of generalized chest pain. Initial encounter. EXAM: CHEST  2 VIEW COMPARISON:  Chest radiograph performed 09/20/2007 FINDINGS: The lungs are well-aerated. Vascular congestion is noted. Mild bibasilar atelectasis is seen. There is no evidence of pleural effusion or pneumothorax. The heart is borderline enlarged. No acute osseous abnormalities are seen. IMPRESSION: Vascular congestion and borderline cardiomegaly. Mild bibasilar atelectasis seen. Electronically Signed   By: Garald Balding M.D.   On: 02/25/2015 06:02   Echocardiogram - 2009 SUMMARY - Overall left ventricular systolic function was normal. Left    ventricular ejection fraction was estimated to be 55 %. - There was mild mitral valvular regurgitation. - The right ventricle was mildly dilated. - Estimated peak pulmonary artery systolic pressure 30 mmHg.  ECG: Sinus bradycardia, LAFB, unchanged from prior    ASSESSMENT AND PLAN  1. Chest pain - typical for this patient with two prior interventions in 2003 and 2009 ( stents in LAD and RCA), we will schedule him for a LHC for tomorrow. Troponin is negative x 1. Repeat echo, none since 2009. We will start heparin drip for unstable angina.   2. Hypertension - add amlodipine 2.5 mg po daily   3. Hyperlipidemia - he is intolerant to multiple statins, he should be evaluated for a possible PCSK 9 inhibitors    Signed, Nyomi Howser,  Jamse Belfast, MD, [facility] 02/25/2015, 12:30 PM

## 2015-02-25 NOTE — ED Notes (Signed)
Lunch tray ordered 

## 2015-02-25 NOTE — ED Provider Notes (Signed)
CSN: [id number]     Arrival date & time 02/25/15  0407 History   First MD Initiated Contact with Patient 02/25/15 0413     Chief Complaint  Patient presents with  . Chest Pain     (Consider location/radiation/quality/duration/timing/severity/associated sxs/prior Treatment) Patient is a 75 y.o. male presenting with chest pain. The history is provided by the patient.  Chest Pain Pain location:  L chest Pain quality: pressure   Pain radiates to:  R shoulder Pain radiates to the back: no   Pain severity:  Moderate Onset quality:  Gradual Duration:  8 hours Timing:  Constant Progression:  Resolved Relieved by:  Nitroglycerin Worsened by:  Nothing tried Ineffective treatments:  None tried Associated symptoms: diaphoresis and nausea   Associated symptoms: no abdominal pain, no fever, no headache, no palpitations, no shortness of breath and not vomiting   Risk factors: coronary artery disease, high cholesterol, hypertension and male sex    75 yo M with chest pain.  Started at 10pm last night at rest.  Feels like prior MI.  Patient with MI x4.  Pain feels like "indigestion" radiates to R shoulder with diaphoresis.  Denies sob.  Nausea without vomiting.  Non exertional.  Given aspirin and nitro by EMS with complete resolution of pain.   Past Medical History  Diagnosis Date  . Non-STEMI (non-ST elevated myocardial infarction) [facility]) October 2003    Proximal LAD tandem lesions -- long DES stent covering both  . CAD S/P percutaneous coronary angioplasty 10/'03; 3/'09; 7/*09;      Dr. Ellyn Hack; '03 - Cypher DES 3.0 mm 33 mm proximal-mid LAD (details D1 with ostial 60%); 3/'09 dRCA 2.75 mm x 13 mm Cypher DES (2.8 mm); 7/'09 pRCA 3.0 mm x 18 m Cypher DES (3.25 mm);; (2009) 2D Echo - EF 55%, (November 2014) nonischemic Myoview;;   . SVT (supraventricular tachycardia) ([place]) 2003  . Persistent sinus bradycardia   . Chronotropic incompetence 12/2011    For Exertional Dyspnea:  MET Test - EKG-sinus  bradycardia, no arrhythmia/ectopy;; treadmill stress test portion of Myoview: Able to achieve 85% maximum heart rate.  . Dyslipidemia, goal LDL below 70     on PharmQuest study medicaion.  . Diabetes mellitus type 2 in obese (HCC)     CAD  . ED (erectile dysfunction)   . Spasm of esophagus   . Diverticulosis   . Personal history of colonic adenomas 09/06/2012  . Insomnia   . Vertigo, benign positional   . Psoriasis     Resting HR in 54s  . Hemorrhoids 07/2002    Internal and External  . GERD (gastroesophageal reflux disease)    Past Surgical History  Procedure Laterality Date  . Shoulder surgery      left rotator cuff, Dr. Gladstone Lighter  . Wrist fracture surgery      right  . Coronary angioplasty with stent placement  LAD - 2003, RCA 3 & 7/ '09    Cypher 3.0 x 32 mid LAD; Cyper 2.75 x 13 - distal RCA, 3.0 x 18 Prox RCA  . Esophagogastroduodenoscopy  08-11-02    esophageal dilation per Dr. Carlean Purl  . Colonoscopy  08-31-12    per Dr. Carlean Purl, adenomatous polyps, repeat in 3 years   . Cataract extraction  [id number]  . Hernia repair      right inguinal   . Percutaneous coronary stent intervention (pci-s)  11/23/2001    NSTEMI: Prox-Mid LAD tandem ~80% lesions on either side of D1 (with 80% lesion) --  Cypher DES 3.0 mm x 33 m (covering both lesions)   . Percutaneous coronary stent intervention (pci-s)  04/27/2007    Unstable Angina: Distal RCA 95%: Cypher 2.75x13  (2.8 mm); residual focal ~60%ISR in LAD stent, 60% D1 ostial (no PTCA on LAD due to no ischemia on ST)  . Percutaneous coronary stent intervention (pci-s)  09/21/2007    Bradycardia & Unstable Angina: Prox RCA 70% - PCI Cypher DES 3.0 mm x 18 mm  (3.25 mm); ostial 60-70% jailed D1. LAD & distal RCA stents patent  . Nm myoview ltd  Nov 2014    ~8 METS; EF 60%, no ischemia or infarction  . Transthoracic echocardiogram  March 2009    Normal LV size and function, EF 55%. Mild MR; mild RV dilation.   Family History  Problem Relation Age  of Onset  . Diabetes Mother   . Hearing loss Father   . Dementia Father   . Heart attack Father   . Heart attack Brother    Social History  Substance Use Topics  . Smoking status: Current Every Day Smoker    Types: Cigars    Start date: 07/06/1952  . Smokeless tobacco: Never Used     Comment: 2-3 per day; he says "I really don't inhale"  . Alcohol Use: 0.0 oz/week    0 Standard drinks or equivalent per week     Comment: once a month    Review of Systems  Constitutional: Positive for diaphoresis. Negative for fever and chills.  HENT: Negative for congestion and facial swelling.   Eyes: Negative for discharge and visual disturbance.  Respiratory: Negative for shortness of breath.   Cardiovascular: Positive for chest pain. Negative for palpitations.  Gastrointestinal: Positive for nausea. Negative for vomiting, abdominal pain and diarrhea.  Musculoskeletal: Negative for myalgias and arthralgias.  Skin: Negative for color change and rash.  Neurological: Negative for tremors, syncope and headaches.  Psychiatric/Behavioral: Negative for confusion and dysphoric mood.      Allergies  Statins and Sulfonamide derivatives  Home Medications   Prior to Admission medications   Medication Sig Start Date End Date Taking? Authorizing Provider  Cholecalciferol (VITAMIN D3) 5000 UNITS CAPS Take 5,000 Units by mouth daily. 06/25/11  Yes Laurey Morale, MD  Cinnamon 500 MG capsule Take 2,000 mg by mouth daily.    Yes Historical Provider, MD  clopidogrel (PLAVIX) 75 MG tablet TAKE 1 TABLET BY MOUTH DAILY 01/09/15  Yes Leonie Man, MD  donepezil (ARICEPT) 5 MG tablet TAKE ONE TABLET BY MOUTH AT BEDTIME 10/19/14  Yes Laurey Morale, MD  fish oil-omega-3 fatty acids 1000 MG capsule Take 2 g by mouth daily.     Yes Historical Provider, MD  glipiZIDE (GLUCOTROL) 5 MG tablet TAKE ONE TABLET BY MOUTH TWICE DAILY BEFORE A MEAL 01/02/15  Yes Laurey Morale, MD  MAGNESIUM CHLORIDE PO Take 1 tablet by  mouth daily.     Yes Historical Provider, MD  meclizine (ANTIVERT) 25 MG tablet TAKE ONE TABLET BY MOUTH THREE TIMES DAILY AS NEEDED FOR DIZZINESS 09/14/14  Yes Leonie Man, MD  omeprazole (PRILOSEC) 20 MG capsule TAKE ONE CAPSULE BY MOUTH ONCE DAILY 09/14/14  Yes Laurey Morale, MD  potassium gluconate 595 MG TABS tablet Take 595 mg by mouth.   Yes Historical Provider, MD  Lancets (BD LANCET ULTRAFINE 30G) MISC USE ONE EVERY DAY AS DIRECTED 10/29/10   Laurey Morale, MD  ONE TOUCH ULTRA TEST test strip TEST  ONCE DAILY AS DIRECTED AS NEEDED TO MONITOR BLOOD GLUCOSE 03/30/12   Laurey Morale, MD   BP 151/73 mmHg  Pulse 49  Temp(Src) 98 F (36.7 C) (Oral)  Resp 16  Ht _0  (1.778 m)  Wt 224 lb 1.6 oz (101.651 kg)  BMI 32.15 kg/m2  SpO2 97% Physical Exam  Constitutional: He is oriented to person, place, and time. He appears well-developed and well-nourished.  HENT:  Head: Normocephalic and atraumatic.  Eyes: EOM are normal. Pupils are equal, round, and reactive to light.  Neck: Normal range of motion. Neck supple. No JVD present.  Cardiovascular: Normal rate, regular rhythm and intact distal pulses.  Exam reveals no gallop and no friction rub.   No murmur heard. Pulmonary/Chest: No respiratory distress. He has no wheezes.  Abdominal: He exhibits no distension. There is no rebound and no guarding.  Musculoskeletal: Normal range of motion.  Neurological: He is alert and oriented to person, place, and time.  Skin: No rash noted. No pallor.  Psychiatric: He has a normal mood and affect. His behavior is normal.  Nursing note and vitals reviewed.   ED Course  Procedures (including critical care time) Labs Review Labs Reviewed  CBC WITH DIFFERENTIAL/PLATELET - Abnormal; Notable for the following:    RBC 4.18 (*)    Hemoglobin 12.9 (*)    HCT 37.9 (*)    All other components within normal limits  COMPREHENSIVE METABOLIC PANEL - Abnormal; Notable for the following:    Glucose, Bld 125 (*)     Total Protein 5.7 (*)    Albumin 3.1 (*)    ALT 14 (*)    All other components within normal limits  TROPONIN I - Abnormal; Notable for the following:    Troponin I 0.18 (*)    All other components within normal limits  GLUCOSE, CAPILLARY - Abnormal; Notable for the following:    Glucose-Capillary 214 (*)    All other components within normal limits  TROPONIN I  TROPONIN I  HEPARIN LEVEL (UNFRACTIONATED)  BASIC METABOLIC PANEL  LIPID PANEL  PROTIME-INR  HEPARIN LEVEL (UNFRACTIONATED)  CBC  I-STAT TROPOININ, ED    Imaging Review Dg Chest 2 View  02/25/2015  CLINICAL DATA:  Acute onset of generalized chest pain. Initial encounter. EXAM: CHEST  2 VIEW COMPARISON:  Chest radiograph performed 09/20/2007 FINDINGS: The lungs are well-aerated. Vascular congestion is noted. Mild bibasilar atelectasis is seen. There is no evidence of pleural effusion or pneumothorax. The heart is borderline enlarged. No acute osseous abnormalities are seen. IMPRESSION: Vascular congestion and borderline cardiomegaly. Mild bibasilar atelectasis seen. Electronically Signed   By: Garald Balding M.D.   On: 02/25/2015 06:02   I have personally reviewed and evaluated these images and lab results as part of my medical decision-making.   EKG Interpretation   Date/Time:  Monday February 25 2015 04:16:48 EST Ventricular Rate:  50 PR Interval:  175 QRS Duration: 108 QT Interval:  465 QTC Calculation: 424 R Axis:   -57 Text Interpretation:  Sinus rhythm Left anterior fascicular block Abnormal  R-wave progression, late transition No significant change since last  tracing Confirmed by Audrina Marten MD, Quillian Quince ([id number]) on 02/25/2015 5:18:17 AM      MDM   Final diagnoses:  Chest pain with high risk for cardiac etiology    75 yo M with hx of CAD with symptoms similar to prior MI.  Initial trop negative.  Attempted to page cards x4 from [phone].  Patient continues to be  pain free, care turned over to Dr.  Regenia Skeeter.  The patients results and plan were reviewed and discussed.   Any x-rays performed were independently reviewed by myself.   Differential diagnosis were considered with the presenting HPI.  Medications  amLODipine (NORVASC) tablet 2.5 mg (2.5 mg Oral Given 02/25/15 1430)  aspirin EC tablet 81 mg (81 mg Oral Given 02/25/15 1431)  nitroGLYCERIN (NITROSTAT) SL tablet 0.4 mg (not administered)  acetaminophen (TYLENOL) tablet 650 mg (not administered)  ondansetron (ZOFRAN) injection 4 mg (not administered)  sodium chloride 0.9 % injection 3 mL (3 mLs Intravenous Given 02/25/15 1432)  sodium chloride 0.9 % injection 3 mL (not administered)  0.9 %  sodium chloride infusion (not administered)  ALPRAZolam (XANAX) tablet 0.25 mg (not administered)  zolpidem (AMBIEN) tablet 5 mg (not administered)  clopidogrel (PLAVIX) tablet 75 mg (75 mg Oral Given 02/25/15 1431)  donepezil (ARICEPT) tablet 5 mg (not administered)  omega-3 acid ethyl esters (LOVAZA) capsule 2 g (not administered)  glipiZIDE (GLUCOTROL) tablet 5 mg (5 mg Oral Not Given 02/25/15 1739)  pantoprazole (PROTONIX) EC tablet 40 mg (40 mg Oral Given 02/25/15 1430)  insulin aspart (novoLOG) injection 0-15 Units (5 Units Subcutaneous Given 02/25/15 1738)  insulin aspart (novoLOG) injection 0-5 Units (not administered)  heparin ADULT infusion 100 units/mL (25000 units/250 mL) (14 Units/kg/hr  94 kg (Order-Specific) Intravenous New Bag/Given 02/25/15 1429)  nitroGLYCERIN (NITROGLYN) 2 % ointment 0.5 inch (0.5 inches Topical Given 02/25/15 1557)  heparin bolus via infusion 4,000 Units (4,000 Units Intravenous Given 02/25/15 1430)    Filed Vitals:   02/25/15 1145 02/25/15 1224 02/25/15 1228 02/25/15 1459  BP: 149/71  151/73   Pulse: 52  49   Temp:   98 F (36.7 C)   TempSrc:   Oral   Resp: 15  16   Height:  _0  (1.778 m)    Weight:  224 lb 1.6 oz (101.651 kg)    SpO2: 97%  98% 97%    Final diagnoses:  None    Admission/ observation  were discussed with the admitting physician, patient and/or family and they are comfortable with the plan.      Deno Etienne, DO 02/25/15 [phone]

## 2015-02-25 NOTE — ED Notes (Signed)
Attempted to call report x 1  

## 2015-02-25 NOTE — ED Notes (Signed)
Pt reports intermittent diffuse chest pain, onset tonight at 11pm. 4/10 pain at present. Respirations unlabored. History of MI. Pt is alert and oriented x4.

## 2015-02-26 ENCOUNTER — Encounter (HOSPITAL_COMMUNITY)
Admission: EM | Disposition: A | Discharge status: Home / Self Care | Payer: Self-pay | Source: Home / Self Care | Attending: Cardiology

## 2015-02-26 ENCOUNTER — Encounter (HOSPITAL_COMMUNITY): Payer: Self-pay

## 2015-02-26 ENCOUNTER — Other Ambulatory Visit: Payer: Self-pay

## 2015-02-26 DIAGNOSIS — I25110 Atherosclerotic heart disease of native coronary artery with unstable angina pectoris: Principal | ICD-10-CM

## 2015-02-26 DIAGNOSIS — I495 Sick sinus syndrome: Secondary | ICD-10-CM

## 2015-02-26 DIAGNOSIS — I2510 Atherosclerotic heart disease of native coronary artery without angina pectoris: Secondary | ICD-10-CM | POA: Insufficient documentation

## 2015-02-26 HISTORY — PX: CARDIAC CATHETERIZATION: SHX172

## 2015-02-26 LAB — POCT ACTIVATED CLOTTING TIME
Activated Clotting Time: 265 seconds
Activated Clotting Time: 281 seconds

## 2015-02-26 LAB — GLUCOSE, CAPILLARY
Glucose-Capillary: 153 mg/dL — ABNORMAL HIGH (ref 65–99)
Glucose-Capillary: 147 mg/dL — ABNORMAL HIGH (ref 65–99)
Glucose-Capillary: 124 mg/dL — ABNORMAL HIGH (ref 65–99)
Glucose-Capillary: 182 mg/dL — ABNORMAL HIGH (ref 65–99)
Glucose-Capillary: 174 mg/dL — ABNORMAL HIGH (ref 65–99)

## 2015-02-26 LAB — CBC
HCT: 35.8 % — ABNORMAL LOW (ref 39.0–52.0)
HEMOGLOBIN: 12 g/dL — AB (ref 13.0–17.0)
MCH: 30.6 pg (ref 26.0–34.0)
MCHC: 33.5 g/dL (ref 30.0–36.0)
MCV: 91.3 fL (ref 78.0–100.0)
Platelets: 188 10*3/uL (ref 150–400)
RBC: 3.92 MIL/uL — AB (ref 4.22–5.81)
RDW: 12.4 % (ref 11.5–15.5)
WBC: 7.6 10*3/uL (ref 4.0–10.5)

## 2015-02-26 LAB — BASIC METABOLIC PANEL
Anion gap: 9 (ref 5–15)
BUN: 14 mg/dL (ref 6–20)
CO2: 24 mmol/L (ref 22–32)
Calcium: 8.4 mg/dL — ABNORMAL LOW (ref 8.9–10.3)
Chloride: 100 mmol/L — ABNORMAL LOW (ref 101–111)
Creatinine, Ser: 1.3 mg/dL — ABNORMAL HIGH (ref 0.61–1.24)
GFR calc Af Amer: >60 mL/min (ref >60)
GFR calc non Af Amer: 52 mL/min — ABNORMAL LOW (ref >60)
Glucose, Bld: 188 mg/dL — ABNORMAL HIGH (ref 65–99)
Potassium: 4.3 mmol/L (ref 3.5–5.1)
Sodium: 133 mmol/L — ABNORMAL LOW (ref 135–145)

## 2015-02-26 LAB — LIPID PANEL
Cholesterol: 197 mg/dL (ref 0–200)
HDL: 41 mg/dL (ref >40)
LDL Cholesterol: 127 mg/dL — ABNORMAL HIGH (ref 0–99)
Total CHOL/HDL Ratio: 4.8 RATIO
Triglycerides: 145 mg/dL (ref <150)
VLDL: 29 mg/dL (ref 0–40)

## 2015-02-26 LAB — TROPONIN I: Troponin I: 0.3 ng/mL — ABNORMAL HIGH (ref <0.031)

## 2015-02-26 LAB — PROTIME-INR
INR: 1.07 (ref 0.00–1.49)
Prothrombin Time: 14.1 seconds (ref 11.6–15.2)

## 2015-02-26 LAB — HEPARIN LEVEL (UNFRACTIONATED): Heparin Unfractionated: 0.58 IU/mL (ref 0.30–0.70)

## 2015-02-26 SURGERY — LEFT HEART CATH AND CORONARY ANGIOGRAPHY
Anesthesia: LOCAL

## 2015-02-26 MED ORDER — HEPARIN (PORCINE) IN NACL 2-0.9 UNIT/ML-% IJ SOLN
INTRAMUSCULAR | Status: AC
Start: 2015-02-26 — End: 2015-02-26
  Filled 2015-02-26: qty 1000

## 2015-02-26 MED ORDER — NITROGLYCERIN 1 MG/10 ML FOR IR/CATH LAB
INTRA_ARTERIAL | Status: AC
  Filled 2015-02-26: qty 10

## 2015-02-26 MED ORDER — NITROGLYCERIN 1 MG/10 ML FOR IR/CATH LAB
INTRA_ARTERIAL | Status: DC | PRN
  Administered 2015-02-26: 16:00:00

## 2015-02-26 MED ORDER — TICAGRELOR 90 MG PO TABS
ORAL_TABLET | ORAL | Status: AC
  Filled 2015-02-26: qty 2

## 2015-02-26 MED ORDER — FENTANYL CITRATE (PF) 100 MCG/2ML IJ SOLN
INTRAMUSCULAR | Status: AC
  Filled 2015-02-26: qty 2

## 2015-02-26 MED ORDER — VERAPAMIL HCL 2.5 MG/ML IV SOLN
INTRA_ARTERIAL | Status: DC | PRN
  Administered 2015-02-26: 20 mL via INTRA_ARTERIAL

## 2015-02-26 MED ORDER — HEPARIN SODIUM (PORCINE) 1000 UNIT/ML IJ SOLN
INTRAMUSCULAR | Status: AC
  Filled 2015-02-26: qty 1

## 2015-02-26 MED ORDER — TICAGRELOR 90 MG PO TABS
ORAL_TABLET | ORAL | Status: DC | PRN
Start: 2015-02-26 — End: 2015-02-26
  Administered 2015-02-26: 180 mg via ORAL

## 2015-02-26 MED ORDER — LIVING WELL WITH DIABETES BOOK
Freq: Once | Status: AC
  Administered 2015-02-26: 21:00:00
  Filled 2015-02-26: qty 1

## 2015-02-26 MED ORDER — LIDOCAINE HCL (PF) 1 % IJ SOLN
INTRAMUSCULAR | Status: AC
  Filled 2015-02-26: qty 30

## 2015-02-26 MED ORDER — FENTANYL CITRATE (PF) 100 MCG/2ML IJ SOLN
INTRAMUSCULAR | Status: DC | PRN
  Administered 2015-02-26: 25 ug via INTRAVENOUS

## 2015-02-26 MED ORDER — SODIUM CHLORIDE 0.9 % IJ SOLN
3.0000 mL | Freq: Two times a day (BID) | INTRAMUSCULAR | Status: DC
  Administered 2015-02-27: 3 mL via INTRAVENOUS

## 2015-02-26 MED ORDER — MIDAZOLAM HCL 2 MG/2ML IJ SOLN
INTRAMUSCULAR | Status: DC | PRN
  Administered 2015-02-26: 1 mg via INTRAVENOUS

## 2015-02-26 MED ORDER — MIDAZOLAM HCL 2 MG/2ML IJ SOLN
INTRAMUSCULAR | Status: AC
  Filled 2015-02-26: qty 2

## 2015-02-26 MED ORDER — TICAGRELOR 90 MG PO TABS
90.0000 mg | ORAL_TABLET | Freq: Two times a day (BID) | ORAL | Status: DC
Start: 2015-02-26 — End: 2015-02-27
  Administered 2015-02-27 (×2): 90 mg via ORAL
  Filled 2015-02-26 (×2): qty 1

## 2015-02-26 MED ORDER — ADENOSINE 12 MG/4ML IV SOLN
16.0000 mL | Freq: Once | INTRAVENOUS | Status: DC
Start: 2015-02-26 — End: 2015-02-27
  Filled 2015-02-26: qty 16

## 2015-02-26 MED ORDER — HEART ATTACK BOUNCING BOOK
Freq: Once | Status: AC
  Administered 2015-02-26: 21:00:00
  Filled 2015-02-26: qty 1

## 2015-02-26 MED ORDER — ADENOSINE (DIAGNOSTIC) 140MCG/KG/MIN
INTRAVENOUS | Status: DC | PRN
  Administered 2015-02-26: 140 ug/kg/min via INTRAVENOUS

## 2015-02-26 MED ORDER — SODIUM CHLORIDE 0.9 % IV SOLN: 250.0000 mL | INTRAVENOUS | Status: DC | PRN

## 2015-02-26 MED ORDER — HEPARIN SODIUM (PORCINE) 1000 UNIT/ML IJ SOLN
INTRAMUSCULAR | Status: DC | PRN
  Administered 2015-02-26: 2500 [IU] via INTRAVENOUS
  Administered 2015-02-26: 5000 [IU] via INTRAVENOUS
  Administered 2015-02-26: 3000 [IU] via INTRAVENOUS
  Administered 2015-02-26: 2000 [IU] via INTRAVENOUS

## 2015-02-26 MED ORDER — IOHEXOL 350 MG/ML SOLN
INTRAVENOUS | Status: DC | PRN
  Administered 2015-02-26: 150 mL via INTRAVENOUS

## 2015-02-26 MED ORDER — HYDROCODONE-ACETAMINOPHEN 7.5-325 MG PO TABS
1.0000 | ORAL_TABLET | ORAL | Status: DC | PRN
  Administered 2015-02-26 (×2): 1 via ORAL
  Filled 2015-02-26 (×2): qty 1

## 2015-02-26 MED ORDER — MORPHINE SULFATE (PF) 2 MG/ML IV SOLN: 2.0000 mg | INTRAVENOUS | Status: DC | PRN

## 2015-02-26 MED ORDER — SODIUM CHLORIDE 0.9 % IJ SOLN: 3.0000 mL | INTRAMUSCULAR | Status: DC | PRN

## 2015-02-26 MED ORDER — ANGIOPLASTY BOOK
Freq: Once | Status: AC
  Administered 2015-02-26: 21:00:00
  Filled 2015-02-26: qty 1

## 2015-02-26 MED ORDER — VERAPAMIL HCL 2.5 MG/ML IV SOLN
INTRAVENOUS | Status: AC
  Filled 2015-02-26: qty 2

## 2015-02-26 MED ORDER — SODIUM CHLORIDE 0.9 % WEIGHT BASED INFUSION: 3.0000 mL/kg/h | INTRAVENOUS | Status: AC

## 2015-02-26 SURGICAL SUPPLY — 21 items
BALLN EMERGE MR 2.5X8 (BALLOONS) ×2
BALLN ~~LOC~~ EMERGE MR 3.0X15 (BALLOONS) ×2
BALLN ~~LOC~~ EUPHORA RX 3.5X8 (BALLOONS) ×2
BALLOON EMERGE MR 2.5X8 (BALLOONS) IMPLANT
BALLOON ~~LOC~~ EMERGE MR 3.0X15 (BALLOONS) IMPLANT
BALLOON ~~LOC~~ EUPHORA RX 3.5X8 (BALLOONS) IMPLANT
CATH HEARTRAIL IKARI 6F IR1.0 (CATHETERS) ×1 IMPLANT
CATH OPTITORQUE TIG 4.0 5F (CATHETERS) ×2 IMPLANT
CATH VISTA GUIDE 6FR XBLAD3.5 (CATHETERS) ×1 IMPLANT
DEVICE RAD COMP TR BAND LRG (VASCULAR PRODUCTS) ×3 IMPLANT
GLIDESHEATH SLEND A-KIT 6F 22G (SHEATH) ×2 IMPLANT
GUIDEWIRE PRESSURE COMET II (WIRE) ×1 IMPLANT
KIT ENCORE 26 ADVANTAGE (KITS) ×1 IMPLANT
KIT HEART LEFT (KITS) ×2 IMPLANT
PACK CARDIAC CATHETERIZATION (CUSTOM PROCEDURE TRAY) ×2 IMPLANT
STENT PROMUS PREM MR 2.75X20 (Permanent Stent) ×1 IMPLANT
STENT PROMUS PREM MR 3.0X12 (Permanent Stent) ×1 IMPLANT
TRANSDUCER W/STOPCOCK (MISCELLANEOUS) ×2 IMPLANT
TUBING CIL FLEX 10 FLL-RA (TUBING) ×2 IMPLANT
WIRE HI TORQ BMW 190CM (WIRE) ×1 IMPLANT
WIRE SAFE-T 1.5MM-J .035X260CM (WIRE) ×2 IMPLANT

## 2015-02-26 NOTE — Progress Notes (Signed)
UR Completed Josefa [place] Graves-Bigelow, RN,BSN [phone]  

## 2015-02-26 NOTE — Progress Notes (Signed)
Pt noted to have hx of persistent bradycardia.  Pt heart rate 39-45 while sleeping.  Remains bradycardic 40-50s while awake and asymptomatic.  Will cont to monitor.

## 2015-02-26 NOTE — Progress Notes (Signed)
ANTICOAGULATION CONSULT NOTE - Follow Up Consult  Pharmacy Consult for Heparin Indication: chest pain/ACS   Allergies  Allergen Reactions  . Statins     REACTION: myalgias  . Sulfonamide Derivatives     unknown    Patient Measurements: Height: '5\' 10"'  (177.8 cm) Weight: 225 lb 1.6 oz (102.105 kg) IBW/kg (Calculated) : 73 Heparin Dosing Weight: 94.4 kg   Vital Signs: Temp: 98.1 F (36.7 C) (01/03 0403) Temp Source: Oral (01/03 0403) BP: 142/55 mmHg (01/03 0403)  Labs:  Recent Labs  02/25/15 0521 02/25/15 1340 02/25/15 1854 02/25/15 2219 02/26/15 0050 02/26/15 0344 02/26/15 0812  HGB 12.9*  --   --   --  12.0*  --   --   HCT 37.9*  --   --   --  35.8*  --   --   PLT 193  --   --   --  188  --   --   LABPROT  --   --   --   --   --  14.1  --   INR  --   --   --   --   --  1.07  --   HEPARINUNFRC  --   --   --  0.77*  --   --  0.58  CREATININE 1.01  --   --   --  1.30*  --   --   TROPONINI  --  0.18* 0.25*  --  0.30*  --   --     Estimated Creatinine Clearance: 58.8 mL/min (by C-G formula based on Cr of 1.3).   Medical History: Past Medical History  Diagnosis Date  . Non-STEMI (non-ST elevated myocardial infarction) [facility]) October 2003    Proximal LAD tandem lesions -- long DES stent covering both  . CAD S/P percutaneous coronary angioplasty 10/'03; 3/'09; 7/*09;      Dr. Ellyn Hack; '03 - Cypher DES 3.0 mm 33 mm proximal-mid LAD (details D1 with ostial 60%); 3/'09 dRCA 2.75 mm x 13 mm Cypher DES (2.8 mm); 7/'09 pRCA 3.0 mm x 18 m Cypher DES (3.25 mm);; (2009) 2D Echo - EF 55%, (November 2014) nonischemic Myoview;;   . SVT (supraventricular tachycardia) ([place]) 2003  . Persistent sinus bradycardia   . Chronotropic incompetence 12/2011    For Exertional Dyspnea:  MET Test - EKG-sinus bradycardia, no arrhythmia/ectopy;; treadmill stress test portion of Myoview: Able to achieve 85% maximum heart rate.  . Dyslipidemia, goal LDL below 70     on Pharm[employer] study  medicaion.  . Diabetes mellitus type 2 in obese (HCC)     CAD  . ED (erectile dysfunction)   . Spasm of esophagus   . Diverticulosis   . Personal history of colonic adenomas 09/06/2012  . Insomnia   . Vertigo, benign positional   . Psoriasis     Resting HR in 54s  . Hemorrhoids 07/2002    Internal and External  . GERD (gastroesophageal reflux disease)     Medications:  Prescriptions prior to admission  Medication Sig Dispense Refill Last Dose  . Cholecalciferol (VITAMIN D3) 5000 UNITS CAPS Take 5,000 Units by mouth daily. 30 capsule 0 02/24/2015 at Unknown time  . Cinnamon 500 MG capsule Take 2,000 mg by mouth daily.    02/24/2015 at Unknown time  . clopidogrel (PLAVIX) 75 MG tablet TAKE 1 TABLET BY MOUTH DAILY 90 tablet 0 02/24/2015 at Unknown time  . donepezil (ARICEPT) 5 MG tablet TAKE ONE TABLET BY MOUTH AT  BEDTIME 90 tablet 3 02/24/2015 at Unknown time  . fish oil-omega-3 fatty acids 1000 MG capsule Take 2 g by mouth daily.     02/24/2015 at Unknown time  . glipiZIDE (GLUCOTROL) 5 MG tablet TAKE ONE TABLET BY MOUTH TWICE DAILY BEFORE A MEAL 180 tablet 3 02/24/2015 at Unknown time  . MAGNESIUM CHLORIDE PO Take 1 tablet by mouth daily.     02/24/2015 at Unknown time  . meclizine (ANTIVERT) 25 MG tablet TAKE ONE TABLET BY MOUTH THREE TIMES DAILY AS NEEDED FOR DIZZINESS 90 tablet 0 Past Week at Unknown time  . omeprazole (PRILOSEC) 20 MG capsule TAKE ONE CAPSULE BY MOUTH ONCE DAILY 90 capsule 1 02/24/2015 at Unknown time  . potassium gluconate 595 MG TABS tablet Take 595 mg by mouth.   02/24/2015 at Unknown time  . Lancets (BD LANCET ULTRAFINE 30G) MISC USE ONE EVERY DAY AS DIRECTED 100 each 10 unknown  . ONE TOUCH ULTRA TEST test strip TEST ONCE DAILY AS DIRECTED AS NEEDED TO MONITOR BLOOD GLUCOSE 100 each 0 unknown   Scheduled:  . amLODipine  2.5 mg Oral Daily  . aspirin EC  81 mg Oral Daily  . clopidogrel  75 mg Oral Daily  . donepezil  5 mg Oral QHS  . glipiZIDE  5 mg Oral BID AC  . insulin  aspart  0-15 Units Subcutaneous TID WC  . insulin aspart  0-5 Units Subcutaneous QHS  . nitroGLYCERIN  0.5 inch Topical 4 times per day  . omega-3 acid ethyl esters  2 g Oral Daily  . pantoprazole  40 mg Oral Daily  . sodium chloride  3 mL Intravenous Q12H  . sodium chloride  3 mL Intravenous Q12H    Assessment: 75 y.o. male with a PMHx of CAD PCI to LAD & RCA (2003 and 2009).  Pharmacy consult for IV heparin infusion for unstable angina. The 8 hour heparin level is therapeutic at 0.58 on heparin drip rate 1200 units/hr.   Hgb 12.0 stable , pltc wnl. No s/s of bleeding reported. Plan for cardiac cath today 02/26/15.  Cardiologist noted this AM Troponin 0.18> 0.25> 0.3 and that patient is currently chest pain free. Echo showed LV  EF of 60-65%, no LVH, grade 1 DD.   Goal of Therapy:  Heparin level 0.3-0.7 units/ml Monitor platelets by anticoagulation protocol: Yes   Plan:  Continue heparin infusion at current rate of 1200 units/hr  Heparin level in 6 hours to confirm therapeutic x 2 consecutive levels then check daily HL.  Daily heparin level and CBC.  Nicole Cella, RPh Clinical Pharmacist Pager: [phone] 02/26/2015 11:40 AM

## 2015-02-26 NOTE — Progress Notes (Signed)
Patient Name: Joe Murray Date of Encounter: 02/26/2015   SUBJECTIVE  Feeling well. No chest pain, sob or palpitations.   CURRENT MEDS . amLODipine  2.5 mg Oral Daily  . aspirin EC  81 mg Oral Daily  . clopidogrel  75 mg Oral Daily  . donepezil  5 mg Oral QHS  . glipiZIDE  5 mg Oral BID AC  . insulin aspart  0-15 Units Subcutaneous TID WC  . insulin aspart  0-5 Units Subcutaneous QHS  . nitroGLYCERIN  0.5 inch Topical 4 times per day  . omega-3 acid ethyl esters  2 g Oral Daily  . pantoprazole  40 mg Oral Daily  . sodium chloride  3 mL Intravenous Q12H  . sodium chloride  3 mL Intravenous Q12H    OBJECTIVE  Filed Vitals:   02/25/15 1459 02/25/15 2008 02/25/15 2045 02/26/15 0403  BP:   115/53 142/55  Pulse:   49   Temp:   98.7 F (37.1 C) 98.1 F (36.7 C)  TempSrc:   Oral Oral  Resp:   18 18  Height:      Weight:  223 lb 15.8 oz (101.6 kg)  225 lb 1.6 oz (102.105 kg)  SpO2: 97%  97% 96%    Intake/Output Summary (Last 24 hours) at 02/26/15 1037 Last data filed at 02/26/15 0600  Gross per 24 hour  Intake 1426.38 ml  Output   1450 ml  Net -23.62 ml   Filed Weights   02/25/15 1224 02/25/15 2008 02/26/15 0403  Weight: 224 lb 1.6 oz (101.651 kg) 223 lb 15.8 oz (101.6 kg) 225 lb 1.6 oz (102.105 kg)    PHYSICAL EXAM  General: Pleasant, NAD. Neuro: Alert and oriented X 3. Moves all extremities spontaneously. Psych: Normal affect. HEENT:  Normal  Neck: Supple without bruits or JVD. Lungs:  Resp regular and unlabored, CTA. Heart: RRR no s3, s4, or murmurs. Abdomen: Soft, non-tender, non-distended, BS + x 4.  Extremities: No clubbing, cyanosis or edema. DP/PT/Radials 2+ and equal bilaterally.  Accessory Clinical Findings  CBC  Recent Labs  02/25/15 0521 02/26/15 0050  WBC 7.3 7.6  NEUTROABS 4.1  --   HGB 12.9* 12.0*  HCT 37.9* 35.8*  MCV 90.7 91.3  PLT 193 0000000   Basic Metabolic Panel  Recent Labs  02/25/15 0521 02/26/15 0050  NA 137 133*    K 4.4 4.3  CL 104 100*  CO2 23 24  GLUCOSE 125* 188*  BUN 15 14  CREATININE 1.01 1.30*  CALCIUM 9.0 8.4*   Liver Function Tests  Recent Labs  02/25/15 0521  AST 16  ALT 14*  ALKPHOS 50  BILITOT 0.7  PROT 5.7*  ALBUMIN 3.1*   No results for input(s): LIPASE, AMYLASE in the last 72 hours. Cardiac Enzymes  Recent Labs  02/25/15 1340 02/25/15 1854 02/26/15 0050  TROPONINI 0.18* 0.25* 0.30*   Fasting Lipid Panel  Recent Labs  02/26/15 0050  CHOL 197  HDL 41  LDLCALC 127*  TRIG 145  CHOLHDL 4.8    TELE  Sinus bradycardia at rate of low 40s  Echo 02/25/15 LV EF: 60% -  65%  ------------------------------------------------------------------- Indications:   Chest pain 786.51.  ------------------------------------------------------------------- History:  PMH: GERD. Coronary artery disease. Angina pectoris. Risk factors: Current tobacco use. Diabetes mellitus. Dyslipidemia.  ------------------------------------------------------------------- Study Conclusions  - Left ventricle: The cavity size was normal. There was moderate concentric hypertrophy. Systolic function was normal. The estimated ejection fraction was in the range of  60% to 65%. Wall motion was normal; there were no regional wall motion abnormalities. Doppler parameters are consistent with abnormal left ventricular relaxation (grade 1 diastolic dysfunction).  Impressions:  - This was a limited study for the evaluation of LVEF that is normal 60-65%, there are no regional wall motion abnormalities  Radiology/Studies  Dg Chest 2 View  02/25/2015  CLINICAL DATA:  Acute onset of generalized chest pain. Initial encounter. EXAM: CHEST  2 VIEW COMPARISON:  Chest radiograph performed 09/20/2007 FINDINGS: The lungs are well-aerated. Vascular congestion is noted. Mild bibasilar atelectasis is seen. There is no evidence of pleural effusion or pneumothorax. The heart is borderline  enlarged. No acute osseous abnormalities are seen. IMPRESSION: Vascular congestion and borderline cardiomegaly. Mild bibasilar atelectasis seen. Electronically Signed   By: Garald Balding M.D.   On: 02/25/2015 06:02    ASSESSMENT AND PLAN  1. Unstable angina (HCC) - Troponin of 0.18-->0.25-->0.30. Currently chest pain free.  - Continue ASA, IV heparin and lovaza - Echo showed LV ef of 60-65%, no LVH, grade 1 DD.   2. CAD: S/P percutaneous coronary angioplasty  - LAD, & RCA x 2  3. Chronic sinus bradycardia  - Avoid AV blocking agent. Tele with rate of low 40s.  4. HTN - Started on amlodipine 2.5mg  due to elevated BP on admission.  - Per Dr. Ellyn Hack note 02/22/15 "consider ARB would be a diabetic, deferred to PCP". Given DD on echo consider adding ACE during admission.   5. HL - 02/26/2015: Cholesterol 197; HDL 41; LDL Cholesterol 127*; Triglycerides 145; VLDL 29 - he is intolerant to multiple statins. Being eval for possible PCSK 9 inhibitors.   6. DM (diabetes mellitus), type 2 with complications  - [id number] of 7.1 - 07/04/2014.  - F/u with PCP.   7. Morbid obesity - Body mass index is 32.3 kg/(m^2).  - The patient neither eating healthy nor doing any exercise. Encouraged lifestyle modification. Spent more than 10 minus on educations.     Jarrett Soho PA-C Pager [phone]  Attending Note:   The patient was seen and examined.  Agree with assessment and plan as noted above.  Changes made to the above note as needed.  Pt was admitted for unstable angina and + Troponin levels.  Going for cath and possible PCI today Discussed risks / benefits / options. He understands and agrees to proceed.    Thayer Headings, Brooke Bonito., MD, [facility] 02/26/2015, 12:03 PM [address],  [place] Pager [phone]

## 2015-02-26 NOTE — Care Management Note (Signed)
Case Management Note  Patient Details  Name: JEFFRE WINDECKER MRN: [id number] Date of Birth: 10-02-1940  Subjective/Objective:  Pt admitted for unstable angina. Plan for cardiac cath 02-26-15.                   Action/Plan: CM will continue to monitor for additional needs.    Expected Discharge Date:                  Expected Discharge Plan:  Home/Self Care  In-House Referral:     Discharge planning Services  CM Consult  Post Acute Care Choice:    Choice offered to:     DME Arranged:    DME Agency:     HH Arranged:    HH Agency:     Status of Service:  In process, will continue to follow  Medicare Important Message Given:    Date Medicare IM Given:    Medicare IM give by:    Date Additional Medicare IM Given:    Additional Medicare Important Message give by:     If discussed at [place] of Stay Meetings, dates discussed:    Additional Comments:  Bethena Roys, RN 02/26/2015, 11:38 AM

## 2015-02-26 NOTE — Interval H&P Note (Signed)
History and Physical Interval Note:  02/26/2015 2:15 PM  Joe Murray  has presented today for surgery, with the diagnosis of unstable angina/ACS  The various methods of treatment have been discussed with the patient and family. After consideration of risks, benefits and other options for treatment, the patient has consented to  Procedure(s): Left Heart Cath and Coronary Angiography (N/A) With Possible Percutaneous Coronary Intervention as a surgical intervention .  The patient's history has been reviewed, patient examined, no change in status, stable for surgery.  I have reviewed the patient's chart and labs.  Questions were answered to the patient's satisfaction.    Performing MD:  Leonie Man, M.D., M.S.  The procedure with Risks/Benefits/Alternatives and Indications was reviewed with the patient.  All questions were answered.    Risks / Complications include, but not limited to: Death, MI, CVA/TIA, VF/VT (with defibrillation), Bradycardia (need for temporary pacer placement), contrast induced nephropathy, bleeding / bruising / hematoma / pseudoaneurysm, vascular or coronary injury (with possible emergent CT or Vascular Surgery), adverse medication reactions, infection.  Additional risks involving the use of radiation with the possibility of radiation burns and cancer were explained in detail.  The patient voices understanding and agrees to proceed.    Cath Lab Visit (complete for each Cath Lab visit)  Clinical Evaluation Leading to the Procedure:   ACS: Yes.    Non-ACS:    Anginal Classification: CCS IV  Anti-ischemic medical therapy: Minimal Therapy (1 class of medications)  Non-Invasive Test Results: No non-invasive testing performed  Prior CABG: No previous CABG  AUC TIMI SCORE  Patient Information:  TIMI Score is 6   A/NSTEMI and high-risk features for short-term risk of death or nonfatal MI Revascularization of the presumed culprit artery  A (9)  Indication:  11; Score: 9 TIMI SCORE  Patient Information:  TIMI Score is 6   A/NSTEMI and high-risk features for short-term risk of death or nonfatal MI  Revascularization of multiple coronary arteries when the culprit artery cannot clearly be determined  A (9)  Indication: 12; Score: 9   Joe Murray

## 2015-02-26 NOTE — H&P (View-Only) (Signed)
Patient Name: Joe Murray Date of Encounter: 02/26/2015   SUBJECTIVE  Feeling well. No chest pain, sob or palpitations.   CURRENT MEDS . amLODipine  2.5 mg Oral Daily  . aspirin EC  81 mg Oral Daily  . clopidogrel  75 mg Oral Daily  . donepezil  5 mg Oral QHS  . glipiZIDE  5 mg Oral BID AC  . insulin aspart  0-15 Units Subcutaneous TID WC  . insulin aspart  0-5 Units Subcutaneous QHS  . nitroGLYCERIN  0.5 inch Topical 4 times per day  . omega-3 acid ethyl esters  2 g Oral Daily  . pantoprazole  40 mg Oral Daily  . sodium chloride  3 mL Intravenous Q12H  . sodium chloride  3 mL Intravenous Q12H    OBJECTIVE  Filed Vitals:   02/25/15 1459 02/25/15 2008 02/25/15 2045 02/26/15 0403  BP:   115/53 142/55  Pulse:   49   Temp:   98.7 F (37.1 C) 98.1 F (36.7 C)  TempSrc:   Oral Oral  Resp:   18 18  Height:      Weight:  223 lb 15.8 oz (101.6 kg)  225 lb 1.6 oz (102.105 kg)  SpO2: 97%  97% 96%    Intake/Output Summary (Last 24 hours) at 02/26/15 1037 Last data filed at 02/26/15 0600  Gross per 24 hour  Intake 1426.38 ml  Output   1450 ml  Net -23.62 ml   Filed Weights   02/25/15 1224 02/25/15 2008 02/26/15 0403  Weight: 224 lb 1.6 oz (101.651 kg) 223 lb 15.8 oz (101.6 kg) 225 lb 1.6 oz (102.105 kg)    PHYSICAL EXAM  General: Pleasant, NAD. Neuro: Alert and oriented X 3. Moves all extremities spontaneously. Psych: Normal affect. HEENT:  Normal  Neck: Supple without bruits or JVD. Lungs:  Resp regular and unlabored, CTA. Heart: RRR no s3, s4, or murmurs. Abdomen: Soft, non-tender, non-distended, BS + x 4.  Extremities: No clubbing, cyanosis or edema. DP/PT/Radials 2+ and equal bilaterally.  Accessory Clinical Findings  CBC  Recent Labs  02/25/15 0521 02/26/15 0050  WBC 7.3 7.6  NEUTROABS 4.1  --   HGB 12.9* 12.0*  HCT 37.9* 35.8*  MCV 90.7 91.3  PLT 193 0000000   Basic Metabolic Panel  Recent Labs  02/25/15 0521 02/26/15 0050  NA 137 133*    K 4.4 4.3  CL 104 100*  CO2 23 24  GLUCOSE 125* 188*  BUN 15 14  CREATININE 1.01 1.30*  CALCIUM 9.0 8.4*   Liver Function Tests  Recent Labs  02/25/15 0521  AST 16  ALT 14*  ALKPHOS 50  BILITOT 0.7  PROT 5.7*  ALBUMIN 3.1*   No results for input(s): LIPASE, AMYLASE in the last 72 hours. Cardiac Enzymes  Recent Labs  02/25/15 1340 02/25/15 1854 02/26/15 0050  TROPONINI 0.18* 0.25* 0.30*   Fasting Lipid Panel  Recent Labs  02/26/15 0050  CHOL 197  HDL 41  LDLCALC 127*  TRIG 145  CHOLHDL 4.8    TELE  Sinus bradycardia at rate of low 40s  Echo 02/25/15 LV EF: 60% -  65%  ------------------------------------------------------------------- Indications:   Chest pain 786.51.  ------------------------------------------------------------------- History:  PMH: GERD. Coronary artery disease. Angina pectoris. Risk factors: Current tobacco use. Diabetes mellitus. Dyslipidemia.  ------------------------------------------------------------------- Study Conclusions  - Left ventricle: The cavity size was normal. There was moderate concentric hypertrophy. Systolic function was normal. The estimated ejection fraction was in the range of  60% to 65%. Wall motion was normal; there were no regional wall motion abnormalities. Doppler parameters are consistent with abnormal left ventricular relaxation (grade 1 diastolic dysfunction).  Impressions:  - This was a limited study for the evaluation of LVEF that is normal 60-65%, there are no regional wall motion abnormalities  Radiology/Studies  Dg Chest 2 View  02/25/2015  CLINICAL DATA:  Acute onset of generalized chest pain. Initial encounter. EXAM: CHEST  2 VIEW COMPARISON:  Chest radiograph performed 09/20/2007 FINDINGS: The lungs are well-aerated. Vascular congestion is noted. Mild bibasilar atelectasis is seen. There is no evidence of pleural effusion or pneumothorax. The heart is borderline  enlarged. No acute osseous abnormalities are seen. IMPRESSION: Vascular congestion and borderline cardiomegaly. Mild bibasilar atelectasis seen. Electronically Signed   By: Garald Balding M.D.   On: 02/25/2015 06:02    ASSESSMENT AND PLAN  1. Unstable angina (HCC) - Troponin of 0.18-->0.25-->0.30. Currently chest pain free.  - Continue ASA, IV heparin and lovaza - Echo showed LV ef of 60-65%, no LVH, grade 1 DD.   2. CAD: S/P percutaneous coronary angioplasty  - LAD, & RCA x 2  3. Chronic sinus bradycardia  - Avoid AV blocking agent. Tele with rate of low 40s.  4. HTN - Started on amlodipine 2.5mg  due to elevated BP on admission.  - Per Dr. Ellyn Hack note 02/22/15 "consider ARB would be a diabetic, deferred to PCP". Given DD on echo consider adding ACE during admission.   5. HL - 02/26/2015: Cholesterol 197; HDL 41; LDL Cholesterol 127*; Triglycerides 145; VLDL 29 - he is intolerant to multiple statins. Being eval for possible PCSK 9 inhibitors.   6. DM (diabetes mellitus), type 2 with complications  - [id number] of 7.1 - 07/04/2014.  - F/u with PCP.   7. Morbid obesity - Body mass index is 32.3 kg/(m^2).  - The patient neither eating healthy nor doing any exercise. Encouraged lifestyle modification. Spent more than 10 minus on educations.     Jarrett Soho PA-C Pager [phone]  Attending Note:   The patient was seen and examined.  Agree with assessment and plan as noted above.  Changes made to the above note as needed.  Pt was admitted for unstable angina and + Troponin levels.  Going for cath and possible PCI today Discussed risks / benefits / options. He understands and agrees to proceed.    Thayer Headings, Brooke Bonito., MD, [facility] 02/26/2015, 12:03 PM [address],  [place] Pager [phone]

## 2015-02-26 NOTE — Progress Notes (Signed)
Called to see patients arm from radial cath earlier by Dr. Ellyn Hack.  Patient had swelling up to the elbow all the way back to the stick site.  I replaced the TR band up the arm slightly and held manual pressure on the arm for about 45 minutes.  I ended up putting a manual BP cuff on the forearm and left pressure at 50 mmhg.  Joe Murray the PA also came in to assess the site.  It looks like the swelling has stabilized although his arm is still very swollen and sore.  He has good blood flow to the hand as the sat waveform is still good from the thumb of the affected hand.  RN will continue to monitor.

## 2015-02-26 NOTE — Progress Notes (Signed)
The patient developed a hematoma proximal to the TR band prior to its removal.  It was moved more proximal and a BP cuff was applied to the entire forearm at 39mmHg for about 45 mins.  O2 sats on the finger were good.  The hematoma appears to have spread to the elbow but did not get worse during the 45 min period.   He has a good pulse proximal to the TR band.  We reinflated the BP cuff on the forearm and will start to decrease pressure in the TR band.   Will order an arterial doppler for the morning.  The RN will watch closely.  If hematoma and or worsening pain will need to call vascular surgery.  Tarri Fuller PAC

## 2015-02-26 NOTE — Progress Notes (Signed)
Manual pressure applied to hematoma below TRB , hematoma noted progressing towards forearm to elbow, pt c/o pain on pressure hold. Palpable radial and ulnar pulse. .Dr Ellyn Hack was called, kept arm elevated ,TRB adjusment  needed per above MD. Barbaraann Rondo  from cath lab came and TRB replacement done.with manual pressure to hematoma site..DR harding was paged back,  Gaspar Bidding hager PA notified and seen patient. Incoming RN at bedside and  to continue to monitor pt.

## 2015-02-27 ENCOUNTER — Encounter (HOSPITAL_COMMUNITY): Payer: Self-pay | Admitting: Physician Assistant

## 2015-02-27 ENCOUNTER — Encounter: Payer: Self-pay | Admitting: *Deleted

## 2015-02-27 ENCOUNTER — Inpatient Hospital Stay (HOSPITAL_COMMUNITY): Payer: Medicare HMO

## 2015-02-27 DIAGNOSIS — I214 Non-ST elevation (NSTEMI) myocardial infarction: Secondary | ICD-10-CM

## 2015-02-27 DIAGNOSIS — M79603 Pain in arm, unspecified: Secondary | ICD-10-CM

## 2015-02-27 LAB — GLUCOSE, CAPILLARY
Glucose-Capillary: 129 mg/dL — ABNORMAL HIGH (ref 65–99)
Glucose-Capillary: 117 mg/dL — ABNORMAL HIGH (ref 65–99)

## 2015-02-27 LAB — CBC
HEMATOCRIT: 38.1 % — AB (ref 39.0–52.0)
Hemoglobin: 12.9 g/dL — ABNORMAL LOW (ref 13.0–17.0)
MCH: 30.7 pg (ref 26.0–34.0)
MCHC: 33.9 g/dL (ref 30.0–36.0)
MCV: 90.7 fL (ref 78.0–100.0)
Platelets: 207 10*3/uL (ref 150–400)
RBC: 4.2 MIL/uL — ABNORMAL LOW (ref 4.22–5.81)
RDW: 12.4 % (ref 11.5–15.5)
WBC: 8 10*3/uL (ref 4.0–10.5)

## 2015-02-27 LAB — BASIC METABOLIC PANEL
ANION GAP: 7 (ref 5–15)
BUN: 11 mg/dL (ref 6–20)
CALCIUM: 8.9 mg/dL (ref 8.9–10.3)
CO2: 25 mmol/L (ref 22–32)
Chloride: 105 mmol/L (ref 101–111)
Creatinine, Ser: 1.24 mg/dL (ref 0.61–1.24)
GFR calc Af Amer: >60 mL/min (ref >60)
GFR calc non Af Amer: 55 mL/min — ABNORMAL LOW (ref >60)
GLUCOSE: 119 mg/dL — AB (ref 65–99)
POTASSIUM: 4.2 mmol/L (ref 3.5–5.1)
Sodium: 137 mmol/L (ref 135–145)

## 2015-02-27 MED ORDER — AMLODIPINE BESYLATE 2.5 MG PO TABS: 2.5000 mg | ORAL_TABLET | Freq: Every day | ORAL | Status: DC

## 2015-02-27 MED ORDER — TICAGRELOR 90 MG PO TABS: 90.0000 mg | ORAL_TABLET | Freq: Two times a day (BID) | ORAL | Status: DC

## 2015-02-27 MED ORDER — NITROGLYCERIN 0.4 MG SL SUBL: 0.4000 mg | SUBLINGUAL_TABLET | SUBLINGUAL | Status: DC | PRN

## 2015-02-27 MED ORDER — ASPIRIN 81 MG PO TBEC: 81.0000 mg | DELAYED_RELEASE_TABLET | Freq: Every day | ORAL | Status: DC

## 2015-02-27 NOTE — Discharge Summary (Signed)
Discharge Summary   Patient ID: Joe Murray,  MRN: [id number], DOB/AGE: 03-16-1940 75 y.o.  Admit date: 02/25/2015 Discharge date: 02/27/2015  Primary Care Provider: Alysia Penna A Primary Cardiologist: Dr. Ellyn Hack  Discharge Diagnoses Principal Problem:   NSTEMI (non-ST elevated myocardial infarction) [facility]) Active Problems:   CAD S/P percutaneous coronary angioplasty - LAD, & RCA x 2   DM (diabetes mellitus), type 2 with complications - CAD   Dyslipidemia, goal LDL below 70   Chest pain with moderate risk of acute coronary syndrome   Coronary artery disease involving native coronary artery of native heart with unstable angina pectoris (HCC)   Allergies Allergies  Allergen Reactions  . Statins     REACTION: myalgias  . Sulfonamide Derivatives     unknown    Procedures  Echocardiogram 02/25/2015 LV EF: 60% -  65%  ------------------------------------------------------------------- Indications:   Chest pain 786.51.  ------------------------------------------------------------------- History:  PMH: GERD. Coronary artery disease. Angina pectoris. Risk factors: Current tobacco use. Diabetes mellitus. Dyslipidemia.  ------------------------------------------------------------------- Study Conclusions  - Left ventricle: The cavity size was normal. There was moderate concentric hypertrophy. Systolic function was normal. The estimated ejection fraction was in the range of 60% to 65%. Wall motion was normal; there were no regional wall motion abnormalities. Doppler parameters are consistent with abnormal left ventricular relaxation (grade 1 diastolic dysfunction).  Impressions:  - This was a limited study for the evaluation of LVEF that is normal 60-65%, there are no regional wall motion abnormalities.    Cardiac catheterization 02/26/2015 Conclusion     Mid RCA lesion, 99% stenosed. Post intervention with Promus DES, there is a 0% residual  stenosis.  Mid LAD lesion, 75% stenosed. Post FFR Guided intervention with Promus DES (overlapping proximal stent), there is a 0% residual stenosis.  Moderate in-stent restenosis of proximal LAD stent. Minimal in-stent restenosis of proximal and distal RCA stents. Successful two-vessel PCI of the mid RCA and mid LAD with DES stents.  Plan:  Transfer to [facility] post procedure unit  I have converted from Plavix to Brilinta -- okay to stop aspirin after 3 months  Continue risk factor modification.  Expected discharge tomorrow.        Hospital Course  [name] is 75 yo male with PMH of CAD s/p PCI to LAD and RCA, tobacco abuse, h/o SVT, HLD, asymptomatic baseline bradycardia, DM II, vertigo and GERD. He was last seen in the clinic on 12/282016 at which time he was doing well. He stopped taking Crestor after a month or 2 due to worsening memory loss. He was previously referred to the clinic for evaluation of PCSK 9 inhibitor based on his intolerance to Crestor. His lipid has been worsening since stopping [employer] trial. He presented to the [facility] ED on 02/25/2015 with intermittent diffuse chest pain that is reminiscent of previous angina.  He was admitted to cardiology service for unstable angina. He was also hypertensive and amlodipine 2.5 mg was added. Echocardiogram obtained on 02/25/2015 showed EF 60-65%, no regional wall motion abnormality, grade 1 diastolic dysfunction. He underwent cardiac catheterization on 1/3 which showed a 99% mid RCA lesion treated with Promus DES, 75% mid LAD lesion with positive FFR treated with Promus DES, moderate in-stent restenosis of proximal LAD stent. Afterward, he was placed on aspirin and Brilinta and enrolled in [place] trial.   Post cath, he did have some swelling near the TR band area, after blood pressure cuff was applied to the entire forearm, hematoma spread it to the elbow.  Eventually it resolved without further complication. Upper forearm arterial  ultrasound was obtained on 1/4 which was negative for significant vascular injury. He had triphasic waveform in all major arteries in the right forearm. He had 2+ radial pulse distal to the cath site on the right wrist. He denies any chest discomfort or shortness of breath. He is deemed stable for discharge from cardiology perspective. I have arranged seven-day transition of care follow-up. Emphasis has been placed on compliance with aspirin and Brilinta. I did not give him a Brilinta prescriptions since he will receive 1 year of free medication through twilight trial.    Discharge Vitals Blood pressure 161/65, pulse 50, temperature 98.1 F (36.7 C), temperature source Oral, resp. rate 14, height 5\' 10"  (1.778 m), weight 227 lb 1.2 oz (103 kg), SpO2 97 %.  Filed Weights   02/25/15 2008 02/26/15 0403 02/27/15 0418  Weight: 223 lb 15.8 oz (101.6 kg) 225 lb 1.6 oz (102.105 kg) 227 lb 1.2 oz (103 kg)    Labs  CBC  Recent Labs  02/25/15 0521 02/26/15 0050 02/27/15 0311  WBC 7.3 7.6 8.0  NEUTROABS 4.1  --   --   HGB 12.9* 12.0* 12.9*  HCT 37.9* 35.8* 38.1*  MCV 90.7 91.3 90.7  PLT 193 188 [id number]   Basic Metabolic Panel  Recent Labs  02/26/15 0050 02/27/15 0311  NA 133* 137  K 4.3 4.2  CL 100* 105  CO2 24 25  GLUCOSE 188* 119*  BUN 14 11  CREATININE 1.30* 1.24  CALCIUM 8.4* 8.9   Liver Function Tests  Recent Labs  02/25/15 0521  AST 16  ALT 14*  ALKPHOS 50  BILITOT 0.7  PROT 5.7*  ALBUMIN 3.1*   Cardiac Enzymes  Recent Labs  02/25/15 1340 02/25/15 1854 02/26/15 0050  TROPONINI 0.18* 0.25* 0.30*   Fasting Lipid Panel  Recent Labs  02/26/15 0050  CHOL 197  HDL 41  LDLCALC 127*  TRIG 145  CHOLHDL 4.8   Disposition  Pt is being discharged home today in good condition.  Follow-up Plans & Appointments      Follow-up Information    Follow up with [name], [place] On 03/06/2015.   Specialties:  Cardiology, Radiology   Why:  @8 :00AM   Contact  information:   [address] [place] [address] [phone]       Follow up with Laurey Morale, MD.   Specialty:  Family Medicine   Why:  Please followup with your primary care physician as soon as possible   Contact information:   [place] [address] [phone]       Discharge Medications    Medication List    STOP taking these medications        clopidogrel 75 MG tablet  Commonly known as:  PLAVIX      TAKE these medications        amLODipine 2.5 MG tablet  Commonly known as:  NORVASC  Take 1 tablet (2.5 mg total) by mouth daily.     aspirin 81 MG EC tablet  Take 1 tablet (81 mg total) by mouth daily.     BD LANCET ULTRAFINE 30G Misc  USE ONE EVERY DAY AS DIRECTED     Cinnamon 500 MG capsule  Take 2,000 mg by mouth daily.     donepezil 5 MG tablet  Commonly known as:  ARICEPT  TAKE ONE TABLET BY MOUTH AT BEDTIME     fish  oil-omega-3 fatty acids 1000 MG capsule  Take 2 g by mouth daily.     glipiZIDE 5 MG tablet  Commonly known as:  GLUCOTROL  TAKE ONE TABLET BY MOUTH TWICE DAILY BEFORE A MEAL     MAGNESIUM CHLORIDE PO  Take 1 tablet by mouth daily.     meclizine 25 MG tablet  Commonly known as:  ANTIVERT  TAKE ONE TABLET BY MOUTH THREE TIMES DAILY AS NEEDED FOR DIZZINESS     nitroGLYCERIN 0.4 MG SL tablet  Commonly known as:  NITROSTAT  Place 1 tablet (0.4 mg total) under the tongue every 5 (five) minutes x 3 doses as needed for chest pain.     omeprazole 20 MG capsule  Commonly known as:  PRILOSEC  TAKE ONE CAPSULE BY MOUTH ONCE DAILY     ONE TOUCH ULTRA TEST test strip  Generic drug:  glucose blood  TEST ONCE DAILY AS DIRECTED AS NEEDED TO MONITOR BLOOD GLUCOSE     potassium gluconate 595 MG Tabs tablet  Take 595 mg by mouth.     ticagrelor 90 MG Tabs tablet  Commonly known as:  BRILINTA  Take 1 tablet (90 mg total) by mouth 2 (two) times daily.     Vitamin D3 5000 units Caps  Take 5,000 Units  by mouth daily.        Duration of Discharge Encounter   Greater than 30 minutes including physician time.  Hilbert Corrigan PA-C Pager: [phone] 02/27/2015, 2:10 PM   Attending Note:   The patient was seen and examined.  Agree with assessment and plan as noted above.  Changes made to the above note as needed.  Pt was seen on day of DC. See progress note from same day. Stable for DC   Ramond Dial., MD, [facility] 02/28/2015, 12:36 PM [address]  [place] Pager [phone]

## 2015-02-27 NOTE — Research (Signed)
Expand All Collapse All   TWILIGHT Informed Consent   Subject Name:Joe Murray  Subject met inclusion and exclusion criteria. The informed consent form, study requirements and expectations were reviewed with the subject and questions and concerns were addressed prior to the signing of the consent form. The subject verbalized understanding of the trail requirements. The subject agreed to participate in the TWILIGHT trial and signed the informed consent. The informed consent was obtained prior to performance of any protocol-specific procedures for the subject. A copy of the signed informed consent was given to the subject and a copy was placed in the subject's medical record.  Jake Bathe RN 02/27/15 [phone]

## 2015-02-27 NOTE — Discharge Instructions (Addendum)
No lifting over 5 lbs for 1 week. No sexual activity for 1 week. Keep procedure site clean & dry. If you notice increased pain, swelling, bleeding or pus, call/return!  You may shower, but no soaking baths/hot tubs/pools for 1 week.     Acute Coronary Syndrome Acute coronary syndrome (ACS) is a serious problem in which there is suddenly not enough blood and oxygen supplied to the heart. ACS may mean that one or more of the blood vessels in your heart (coronary arteries) may be blocked. ACS can result in chest pain or a heart attack (myocardial infarction or MI). CAUSES This condition is caused by atherosclerosis, which is the buildup of fat and cholesterol (plaque) on the inside of the arteries. Over time, the plaque may narrow or block the artery, and this will lessen blood flow to the heart. Plaque can also become weak and break off within a coronary artery to form a clot and cause a sudden blockage. RISK FACTORS The risks factors of this condition include:  High cholesterol levels.  High blood pressure (hypertension).  Smoking.  Diabetes.  Age.  Family history of chest pain, heart disease, or stroke.  Lack of exercise. SYMPTOMS The most common signs of this condition include:  Chest pain, which can be:  A crushing or squeezing in the chest.  A tightness, pressure, fullness, or heaviness in the chest.  Present for more than a few minutes, or it can stop and recur.  Pain in the arms, neck, jaw, or back.  Unexplained heartburn or indigestion.  Shortness of breath.  Nausea.  Sudden cold sweats.  Feeling light-headed or dizzy. Sometimes, this condition has no symptoms. DIAGNOSIS ACS may be diagnosed through the following tests:  Electrocardiogram (ECG).  Blood tests.  Coronary angiogram. This is a procedure to look at the coronary arteries to see if there is any blockage. TREATMENT Treatment for ACS may include:  Healthy behavioral changes to reduce or control  risk factors.  Medicine.  Coronary stenting.A stent helps to keep an artery open.  Coronary angioplasty. This procedure widens a narrowed or blocked artery.  Coronary artery bypass surgery. This will allow your blood to pass the blockage (bypass) to reach your heart. HOME CARE INSTRUCTIONS Eating and Drinking  Follow a heart-healthy diet. A dietitian can you help to educate you about healthy food options and changes.  Use healthy cooking methods such as roasting, grilling, broiling, baking, poaching, steaming, or stir-frying. Talk to a dietitian to learn more about healthy cooking methods. Medicines  Take medicines only as directed by your health care provider.  Do not take the following medicines unless your health care provider approves:  Nonsteroidal anti-inflammatory drugs (NSAIDs), such as ibuprofen, naproxen, or celecoxib.  Vitamin supplements that contain vitamin A, vitamin E, or both.  Hormone replacement therapy that contains estrogen with or without progestin.  Stop illegal drug use. Activities  Follow an exercise program that is approved by your health care provider.  Plan rest periods when you are fatigued. Lifestyle  Do not use any tobacco products, including cigarettes, chewing tobacco, or electronic cigarettes. If you need help quitting, ask your health care provider.  If you drink alcohol, and your health care provider approves, limit your alcohol intake to no more than 1 drink per day. One drink equals 12 ounces of beer, 5 ounces of wine, or 1 ounces of hard liquor.  Learn to manage stress.  Maintain a healthy weight. Lose weight as approved by your health care provider.  General Instructions  Manage other health conditions, such as hypertension and diabetes, as directed by your health care provider.  Keep all follow-up visits as directed by your health care provider. This is important.  Your health care provider may ask you to monitor your blood  pressure. A blood pressure reading consists of a higher number over a lower number, such as 110 over 72, written as 110/72. Ideally, your blood pressure should be:  Below 140/90 if you have no other medical conditions.  Below 130/80 if you have diabetes or kidney disease. SEEK IMMEDIATE MEDICAL CARE IF:  You have pain in your chest, neck, arm, jaw, stomach, or back that lasts more than a few minutes, is recurring, or is not relieved by taking medicine under your tongue (sublingual nitroglycerin).  You have profuse sweating without cause.  You have unexplained:  Heartburn or indigestion.  Shortness of breath or difficulty breathing.  Nausea or vomiting.  Fatigue.  Feelings of nervousness or anxiety.  Weakness.  Diarrhea.  You have sudden light-headedness or dizziness.  You faint. These symptoms may represent a serious problem that is an emergency. Do not wait to see if the symptoms will go away. Get medical help right away. Call your local emergency services (911 in the [place] Do not drive yourself to the clinic or hospital.   This information is not intended to replace advice given to you by your health care provider. Make sure you discuss any questions you have with your health care provider.   Document Released: 02/09/2005 Document Revised: 03/02/2014 Document Reviewed: 06/13/2013 Elsevier Interactive Patient Education [employer].

## 2015-02-27 NOTE — Progress Notes (Signed)
CARDIAC REHAB PHASE I   PRE:  Rate/Rhythm: 56 SB  BP:  Supine: 160/56  Sitting:   Standing:    SaO2:   MODE:  Ambulation: 600 ft   POST:  Rate/Rhythm: 70 SR  BP:  Supine:   Sitting: 161/65  Standing:    SaO2:  0800-0920 Pt walked 600 ft and then c/o right hip pain. No CP. Tolerated well. MI education completed with pt and wife who voiced understanding. Stressed importance of brililnta with stent. To see research staff this morning re brilinta. Stressed importance of not smoking cigars but pt unsure if he can quit. Gave handout for smoking cessation. Gave diabetic and heart healthy diets and discussed carb counting. Encouraged walking or riding stationary bike for ed as pt's hip bothers him with long distance walking. Also discussed CRP 2 and referring to [facility] program which he has done before.    Graylon Good, RN BSN  02/27/2015 9:16 AM

## 2015-02-27 NOTE — Progress Notes (Signed)
TR BAND REMOVAL  LOCATION:    right radial  DEFLATED PER PROTOCOL:    Yes.    TIME BAND OFF / DRESSING APPLIED:    23:45   SITE UPON ARRIVAL:    Level 2  SITE AFTER BAND REMOVAL:    Level 1  CIRCULATION SENSATION AND MOVEMENT:    Within Normal Limits   Yes.    COMMENTS:

## 2015-02-27 NOTE — Care Management Note (Signed)
Case Management Note  Patient Details  Name: Joe Murray MRN: [id number] Date of Birth: 1940/06/12  Subjective/Objective:          Unstable angina/ACS, PCI mid LAD and mid RCA           Action/Plan: NCM spoke to pt and wife, [name] at bedside. Pt will participate in Twilight Trial with Brilinta. He will be receiving medication for free for 1 year. Pt ambulatory, no DME needed. Wife at home to assist with his care.   Expected Discharge Date:  02/27/2015               Expected Discharge Plan:  Home/Self Care  In-House Referral:  NA  Discharge planning Services  CM Consult  Post Acute Care Choice:  NA Choice offered to:  NA  DME Arranged:  N/A DME Agency:  NA  HH Arranged:  NA HH Agency:  NA  Status of Service:  Completed, signed off  Medicare Important Message Given:    Date Medicare IM Given:    Medicare IM give by:    Date Additional Medicare IM Given:    Additional Medicare Important Message give by:     If discussed at [place] of Stay Meetings, dates discussed:    Additional Comments:  Erenest Rasher, RN 02/27/2015, 11:18 AM

## 2015-02-27 NOTE — Progress Notes (Addendum)
Patient Name: Joe Murray Date of Encounter: 02/27/2015   SUBJECTIVE  Feeling well. No chest pain, sob or palpitations. Had PCI of his  Mid LAD and mid RCA yesterday  Ready for DC   CURRENT MEDS . adenosine  16 mL Intravenous Once  . amLODipine  2.5 mg Oral Daily  . aspirin EC  81 mg Oral Daily  . donepezil  5 mg Oral QHS  . glipiZIDE  5 mg Oral BID AC  . insulin aspart  0-15 Units Subcutaneous TID WC  . insulin aspart  0-5 Units Subcutaneous QHS  . omega-3 acid ethyl esters  2 g Oral Daily  . pantoprazole  40 mg Oral Daily  . sodium chloride  3 mL Intravenous Q12H  . sodium chloride  3 mL Intravenous Q12H  . ticagrelor  90 mg Oral BID    OBJECTIVE  Filed Vitals:   02/26/15 2000 02/26/15 2005 02/26/15 2100 02/27/15 0418  BP: 174/61 161/51 148/52 143/39  Pulse: 50 53 52 50  Temp:  98.2 F (36.8 C)  98.2 F (36.8 C)  TempSrc:  Oral  Oral  Resp: 18 19 18 14   Height:      Weight:    227 lb 1.2 oz (103 kg)  SpO2: 98% 98% 97% 96%    Intake/Output Summary (Last 24 hours) at 02/27/15 0751 Last data filed at 02/27/15 0046  Gross per 24 hour  Intake 1107.85 ml  Output   4050 ml  Net -2942.15 ml   Filed Weights   02/25/15 2008 02/26/15 0403 02/27/15 0418  Weight: 223 lb 15.8 oz (101.6 kg) 225 lb 1.6 oz (102.105 kg) 227 lb 1.2 oz (103 kg)    PHYSICAL EXAM  General: Pleasant, NAD. Neuro: Alert and oriented X 3. Moves all extremities spontaneously. Psych: Normal affect. HEENT:  Normal  Neck: Supple without bruits or JVD. Lungs:  Resp regular and unlabored, CTA. Heart: RRR no s3, s4, or murmurs. Abdomen: Soft, non-tender, non-distended, BS + x 4.  Extremities:   Right radial cath site moderate size hematoma.   Slight swelling .  Pulses intact. .  Bandage in place   Accessory Clinical Findings  CBC  Recent Labs  02/25/15 0521 02/26/15 0050 02/27/15 0311  WBC 7.3 7.6 8.0  NEUTROABS 4.1  --   --   HGB 12.9* 12.0* 12.9*  HCT 37.9* 35.8* 38.1*  MCV  90.7 91.3 90.7  PLT 193 188 [id number]   Basic Metabolic Panel  Recent Labs  02/26/15 0050 02/27/15 0311  NA 133* 137  K 4.3 4.2  CL 100* 105  CO2 24 25  GLUCOSE 188* 119*  BUN 14 11  CREATININE 1.30* 1.24  CALCIUM 8.4* 8.9   Liver Function Tests  Recent Labs  02/25/15 0521  AST 16  ALT 14*  ALKPHOS 50  BILITOT 0.7  PROT 5.7*  ALBUMIN 3.1*   No results for input(s): LIPASE, AMYLASE in the last 72 hours. Cardiac Enzymes  Recent Labs  02/25/15 1340 02/25/15 1854 02/26/15 0050  TROPONINI 0.18* 0.25* 0.30*   Fasting Lipid Panel  Recent Labs  02/26/15 0050  CHOL 197  HDL 41  LDLCALC 127*  TRIG 145  CHOLHDL 4.8    TELE  Sinus bradycardia at rate of low 40s  Echo 02/25/15 LV EF: 60% -  65%  ------------------------------------------------------------------- Indications:   Chest pain 786.51.  ------------------------------------------------------------------- History:  PMH: GERD. Coronary artery disease. Angina pectoris. Risk factors: Current tobacco use. Diabetes mellitus. Dyslipidemia.  -------------------------------------------------------------------  Study Conclusions  - Left ventricle: The cavity size was normal. There was moderate concentric hypertrophy. Systolic function was normal. The estimated ejection fraction was in the range of 60% to 65%. Wall motion was normal; there were no regional wall motion abnormalities. Doppler parameters are consistent with abnormal left ventricular relaxation (grade 1 diastolic dysfunction).  Impressions:  - This was a limited study for the evaluation of LVEF that is normal 60-65%, there are no regional wall motion abnormalities  Radiology/Studies  Dg Chest 2 View  02/25/2015  CLINICAL DATA:  Acute onset of generalized chest pain. Initial encounter. EXAM: CHEST  2 VIEW COMPARISON:  Chest radiograph performed 09/20/2007 FINDINGS: The lungs are well-aerated. Vascular congestion is noted.  Mild bibasilar atelectasis is seen. There is no evidence of pleural effusion or pneumothorax. The heart is borderline enlarged. No acute osseous abnormalities are seen. IMPRESSION: Vascular congestion and borderline cardiomegaly. Mild bibasilar atelectasis seen. Electronically Signed   By: Garald Balding M.D.   On: 02/25/2015 06:02    ASSESSMENT AND PLAN  1. NSTEMI  ([place]) / CAD  - Troponin of 0.18-->0.25-->0.30.   S/p stenting of the mid LAD and mid RCA by Dr. Ellyn Hack    2. Chronic sinus bradycardia  - Avoid AV blocking agent. Tele with rate of low 40s.  3. HTN - Started on amlodipine 2.5mg  due to elevated BP on admission.  Will also add Losartan 50 a day   4. HL - 02/26/2015: Cholesterol 197; HDL 41; LDL Cholesterol 127*; Triglycerides 145; VLDL 29 - he is intolerant to multiple statins. Being eval for possible PCSK 9 inhibitors.   5. DM (diabetes mellitus), type 2 with complications  - [id number] of 7.1 - 07/04/2014.  - F/u with PCP.    OK for DC today if the right arm duplex looks ok His distal pulse is fine.  He would like an early AM apt.    Nahser, Wonda Cheng, MD  02/27/2015 7:56 AM    [facility],  [place] [place], [place]  [id number] Pager [phone] Phone: [phone]; Fax: [phone]

## 2015-02-27 NOTE — Progress Notes (Signed)
VASCULAR LAB PRELIMINARY  PRELIMINARY  PRELIMINARY  PRELIMINARY  VASCULAR LAB PRELIMINARY RESULTS  Upper Extremity Arterial completed    RIGHT   WAVEFORM  SUBCLAVIAN Triphasic  AXILLARY Triphasic  BRACHIAL Triphasic  RADIAL Triphasic  ULNAR Triphasic    Duplex scan of the right upper extremity revealed normal Doppler waveforms throughout with no evidence of a pseudoaneurysm, A-V fistula or occlusion.  Toma Copier, RVS 02/27/2015 1:37 PM

## 2015-02-28 ENCOUNTER — Telehealth: Payer: Self-pay | Admitting: *Deleted

## 2015-02-28 ENCOUNTER — Other Ambulatory Visit: Payer: Self-pay | Admitting: *Deleted

## 2015-02-28 MED ORDER — AMBULATORY NON FORMULARY MEDICATION: 90.0000 mg | Freq: Two times a day (BID) | Status: DC

## 2015-02-28 MED ORDER — AMBULATORY NON FORMULARY MEDICATION: 81.0000 mg | Freq: Every day | Status: DC

## 2015-02-28 NOTE — Telephone Encounter (Signed)
Transition Care Management Follow-up Telephone Call  How have you been since you were released from the hospital? Doing alright   Do you understand why you were in the hospital? yes   Do you understand the discharge instrcutions? yes  Items Reviewed:  Medications reviewed: yes  Allergies reviewed: yes  Dietary changes reviewed: yes  Referrals reviewed: yes   Functional Questionnaire:   Activities of Daily Living (ADLs):   He states they are independent in the following: ambulation, bathing and hygiene, feeding, continence, grooming, toileting and dressing States they require assistance with the following: none   Any transportation issues/concerns?: no   Any patient concerns? no   Confirmed importance and date/time of follow-up visits scheduled: yes   Confirmed with patient if condition begins to worsen call PCP or go to the ER.  Patient was given the Call-a-Nurse line [phone]: yes Patient was discharged 02/27/15 Patient was discharged to home Patient has an appointment with Dr Sarajane Jews 03/05/15

## 2015-03-01 NOTE — Telephone Encounter (Signed)
noted 

## 2015-03-05 ENCOUNTER — Ambulatory Visit (INDEPENDENT_AMBULATORY_CARE_PROVIDER_SITE_OTHER): Payer: Medicare HMO | Admitting: Family Medicine

## 2015-03-05 ENCOUNTER — Encounter: Payer: Self-pay | Admitting: Family Medicine

## 2015-03-05 VITALS — BP 145/69 | HR 54 | Temp 97.8°F | Ht 70.0 in | Wt 221.0 lb

## 2015-03-05 DIAGNOSIS — E118 Type 2 diabetes mellitus with unspecified complications: Secondary | ICD-10-CM | POA: Diagnosis not present

## 2015-03-05 DIAGNOSIS — I10 Essential (primary) hypertension: Secondary | ICD-10-CM

## 2015-03-05 DIAGNOSIS — E785 Hyperlipidemia, unspecified: Secondary | ICD-10-CM | POA: Diagnosis not present

## 2015-03-05 DIAGNOSIS — I2510 Atherosclerotic heart disease of native coronary artery without angina pectoris: Secondary | ICD-10-CM

## 2015-03-05 DIAGNOSIS — R69 Illness, unspecified: Secondary | ICD-10-CM | POA: Diagnosis not present

## 2015-03-05 DIAGNOSIS — Z9861 Coronary angioplasty status: Secondary | ICD-10-CM

## 2015-03-05 DIAGNOSIS — E559 Vitamin D deficiency, unspecified: Secondary | ICD-10-CM | POA: Diagnosis not present

## 2015-03-05 LAB — HEMOGLOBIN A1C: Hgb A1c MFr Bld: 7 % — ABNORMAL HIGH (ref 4.6–6.5)

## 2015-03-05 MED ORDER — GLUCOSE BLOOD VI STRP: ORAL_STRIP | Status: DC

## 2015-03-05 MED ORDER — BD LANCET ULTRAFINE 30G MISC: Status: DC

## 2015-03-05 NOTE — Addendum Note (Signed)
Addended by: Aggie Hacker A on: 03/05/2015 02:19 PM   Modules accepted: Orders

## 2015-03-05 NOTE — Progress Notes (Signed)
Cardiology Office Note   Date:  03/06/2015   ID:  Joe Murray, DOB 1940/04/10, MRN [id number]  PCP:  Laurey Morale, MD  Cardiologist:  Dr. Ellyn Hack   Chief Complaint  Patient presents with  . Follow-up    seen for Dr. Ellyn Hack  . Coronary Artery Disease    s/p DES to mid RCA and DES to mid LAD on 02/26/2015      History of Present Illness: Joe Murray is a 75 y.o. male who presents for post hospital followup. He has PMH of CAD s/p PCI to LAD and RCA, tobacco abuse, h/o SVT, HLD with intolerance to multiple statin, asymptomatic baseline bradycardia, DM II, vertigo and GERD. Since his last office visit, he has stopped taking Crestor due to worsening memory loss. His lipid has worsened since stopping PharmQuest trial. He was admitted from 1/2-1/4 with chest pain concerning for unstable angina, he was ruled in for NSTEMI after trop became mildly elevated. Echocardiogram obtained on 02/25/2015 showed EF 60-65%, no RWMA, grade 1 DD. He underwent cardiac catheterization on 1/3 which showed a 99% mid RCA lesion treated with Promus DES, 75% mid LAD lesion with positive FFR treated with Promus DES, moderate in-stent restenosis of proximal LAD stent. Afterward, he was placed on aspirin and Brilinta and enrolled in [place] trial. Post cath, he did have some swelling near the TR band area, after blood pressure cuff was applied to the entire forearm, hematoma spread it to the elbow. Eventually it resolved without further complication. Upper forearm arterial ultrasound was obtained on 1/4 which was negative for significant vascular injury. He had triphasic waveform in all major arteries in the right forearm.  Post discharge, he has been doing well. He has been seen by his PCP Dr. Sarajane Jews on 03/05/2015. Hgb A1C was repeated and currently pending. He presents today for post hospital followup, he still has bruising over the R medial antecubital area since left the hospital, he occasional has discomfort in  the area that prevent him from sleeping, but currently has no pain. He denies significant swelling or loss of sensation over the hand. He is compliant with ASA and Brilinta. He has not had significant chest discomfort or SOB since discomfort. Other than occasional arm ache sensation over the bruised area, he has no other discomfort.     Past Medical History  Diagnosis Date  . Non-STEMI (non-ST elevated myocardial infarction) [facility]) October 2003    Proximal LAD tandem lesions -- long DES stent covering both  . CAD S/P percutaneous coronary angioplasty 10/'03; 3/'09; 7/*09;      a. Dr. Ellyn Hack; '03 - Cypher DES 3.0 mm 33 mm proximal-mid LAD (details D1 with ostial 60%); 3/'09 dRCA 2.75 mm x 13 mm Cypher DES (2.8 mm); 7/'09 pRCA 3.0 mm x 18 m Cypher DES (3.25 mm);; (2009) 2D Echo - EF 55%, (November 2014) nonischemic Myoview;; b. Promus DES to RCA and mid LAD 02/25/2014  . SVT (supraventricular tachycardia) ([place]) 2003  . Persistent sinus bradycardia   . Chronotropic incompetence 12/2011    For Exertional Dyspnea:  MET Test - EKG-sinus bradycardia, no arrhythmia/ectopy;; treadmill stress test portion of Myoview: Able to achieve 85% maximum heart rate.  . Dyslipidemia, goal LDL below 70     on Pharm[employer] study medicaion.  . Diabetes mellitus type 2 in obese (HCC)     CAD  . ED (erectile dysfunction)   . Spasm of esophagus   . Diverticulosis   . Personal history of  colonic adenomas 09/06/2012  . Insomnia   . Vertigo, benign positional   . Psoriasis     Resting HR in 54s  . Hemorrhoids 07/2002    Internal and External  . GERD (gastroesophageal reflux disease)     Past Surgical History  Procedure Laterality Date  . Shoulder surgery      left rotator cuff, Dr. Gladstone Lighter  . Wrist fracture surgery      right  . Coronary angioplasty with stent placement  LAD - 2003, RCA 3 & 7/ '09    Cypher 3.0 x 32 mid LAD; Cyper 2.75 x 13 - distal RCA, 3.0 x 18 Prox RCA  . Esophagogastroduodenoscopy  08-11-02      esophageal dilation per Dr. Carlean Purl  . Colonoscopy  08-31-12    per Dr. Carlean Purl, adenomatous polyps, repeat in 3 years   . Cataract extraction  [id number]  . Hernia repair      right inguinal   . Percutaneous coronary stent intervention (pci-s)  11/23/2001    NSTEMI: Prox-Mid LAD tandem ~80% lesions on either side of D1 (with 80% lesion) -- Cypher DES 3.0 mm x 33 m (covering both lesions)   . Percutaneous coronary stent intervention (pci-s)  04/27/2007    Unstable Angina: Distal RCA 95%: Cypher 2.75x13  (2.8 mm); residual focal ~60%ISR in LAD stent, 60% D1 ostial (no PTCA on LAD due to no ischemia on ST)  . Percutaneous coronary stent intervention (pci-s)  09/21/2007    Bradycardia & Unstable Angina: Prox RCA 70% - PCI Cypher DES 3.0 mm x 18 mm  (3.25 mm); ostial 60-70% jailed D1. LAD & distal RCA stents patent  . Nm myoview ltd  Nov 2014    ~8 METS; EF 60%, no ischemia or infarction  . Transthoracic echocardiogram  March 2009    Normal LV size and function, EF 55%. Mild MR; mild RV dilation.  . Cardiac catheterization N/A 02/26/2015    Procedure: Left Heart Cath and Coronary Angiography;  Surgeon: Leonie Man, MD;  Location: [place];  Service: Cardiovascular;  Laterality: N/A;  . Cardiac catheterization N/A 02/26/2015    Procedure: Coronary Stent Intervention;  Surgeon: Leonie Man, MD;  Location: [place];  Service: Cardiovascular;  Laterality: N/A;  . Cardiac catheterization N/A 02/26/2015    Procedure: Intravascular Pressure Wire/FFR Study;  Surgeon: Leonie Man, MD;  Location: [facility] [facility];  Service: Cardiovascular;  Laterality: N/A;     Current Outpatient Prescriptions  Medication Sig Dispense Refill  . AMBULATORY NON FORMULARY MEDICATION Take 90 mg by mouth 2 (two) times daily. Medication Name: BRILINTA 90 mg BID (TWILIGHT Research study provided do not fill)    . AMBULATORY NON FORMULARY MEDICATION Take 81 mg by mouth daily. Medication Name: ASA 81 mg  Daily (TWILIGHT Research Study Provided)    . amLODipine (NORVASC) 2.5 MG tablet Take 1 tablet (2.5 mg total) by mouth daily. 90 tablet 3  . Cholecalciferol (VITAMIN D3) 5000 UNITS CAPS Take 5,000 Units by mouth daily. 30 capsule 0  . Cinnamon 500 MG capsule Take 2,000 mg by mouth daily.     Marland Kitchen donepezil (ARICEPT) 5 MG tablet TAKE ONE TABLET BY MOUTH AT BEDTIME 90 tablet 3  . fish oil-omega-3 fatty acids 1000 MG capsule Take 2 g by mouth daily.      Marland Kitchen glipiZIDE (GLUCOTROL) 5 MG tablet TAKE ONE TABLET BY MOUTH TWICE DAILY BEFORE A MEAL 180 tablet 3  . glucose blood (  ONE TOUCH ULTRA TEST) test strip Test once a day and diagnosis code is E 11.9 100 each 0  . Lancets (BD LANCET ULTRAFINE 30G) MISC Test once per day and diagnosis code is E 11.9 100 each 0  . MAGNESIUM CHLORIDE PO Take 1 tablet by mouth daily.      . meclizine (ANTIVERT) 25 MG tablet TAKE ONE TABLET BY MOUTH THREE TIMES DAILY AS NEEDED FOR DIZZINESS 90 tablet 0  . nitroGLYCERIN (NITROSTAT) 0.4 MG SL tablet Place 0.4 mg under the tongue every 5 (five) minutes as needed for chest pain.    Marland Kitchen omeprazole (PRILOSEC) 20 MG capsule TAKE ONE CAPSULE BY MOUTH ONCE DAILY 90 capsule 1  . potassium gluconate 595 MG TABS tablet Take 595 mg by mouth.     No current facility-administered medications for this visit.    Allergies:   Statins and Sulfonamide derivatives    Social History:  The patient  reports that he has been smoking Cigars.  He started smoking about 62 years ago. He has never used smokeless tobacco. He reports that he drinks alcohol. He reports that he does not use illicit drugs.   Family History:  The patient's family history includes Dementia in his father; Diabetes in his mother; Hearing loss in his father; Heart attack in his brother and father.    ROS:  Please see the history of present illness.   Otherwise, review of systems are positive for occasional R arm pain.   All other systems are reviewed and negative.    PHYSICAL  EXAM: VS:  BP 118/72 mmHg  Pulse 80  Ht 5' 10" (1.778 m)  Wt 221 lb (100.245 kg)  BMI 31.71 kg/m2 , BMI Body mass index is 31.71 kg/(m^2). GEN: Well nourished, well developed, in no acute distress HEENT: normal Neck: no JVD, carotid bruits, or masses Cardiac: RRR; no murmurs, rubs, or gallops,no edema  Respiratory:  clear to auscultation bilaterally, normal work of breathing GI: soft, nontender, nondistended, + BS MS: no deformity or atrophy Skin: warm and dry, no rash Neuro:  Strength and sensation are intact Psych: euthymic mood, full affect   EKG:  EKG is ordered today. The ekg ordered today demonstrates sinus bradycardia without significant ST-T wave changes   Recent Labs: 07/04/2014: TSH 1.14 02/25/2015: ALT 14* 02/27/2015: BUN 11; Creatinine, Ser 1.24; Hemoglobin 12.9*; Platelets 207; Potassium 4.2; Sodium 137    Lipid Panel    Component Value Date/Time   CHOL 197 02/26/2015 0050   CHOL 234* 12/27/2012 0823   TRIG 145 02/26/2015 0050   TRIG 228* 12/27/2012 0823   HDL 41 02/26/2015 0050   HDL 113 12/27/2012 0823   CHOLHDL 4.8 02/26/2015 0050   VLDL 29 02/26/2015 0050   LDLCALC 127* 02/26/2015 0050   LDLCALC 75 12/27/2012 0823   LDLDIRECT 85.5 06/27/2012 0945      Wt Readings from Last 3 Encounters:  03/06/15 221 lb (100.245 kg)  03/05/15 221 lb (100.245 kg)  02/27/15 227 lb 1.2 oz (103 kg)      Other studies Reviewed: Additional studies/ records that were reviewed today include:   Echocardiogram 02/25/2015 LV EF: 60% -  65%  ------------------------------------------------------------------- Indications:   Chest pain 786.51.  ------------------------------------------------------------------- History:  PMH: GERD. Coronary artery disease. Angina pectoris. Risk factors: Current tobacco use. Diabetes mellitus. Dyslipidemia.  ------------------------------------------------------------------- Study Conclusions  - Left ventricle: The cavity size  was normal. There was moderate concentric hypertrophy. Systolic function was normal. The estimated ejection fraction was in  the range of 60% to 65%. Wall motion was normal; there were no regional wall motion abnormalities. Doppler parameters are consistent with abnormal left ventricular relaxation (grade 1 diastolic dysfunction).  Impressions:  - This was a limited study for the evaluation of LVEF that is normal 60-65%, there are no regional wall motion abnormalities.    Cardiac catheterization 02/26/2015 Conclusion      Mid RCA lesion, 99% stenosed. Post intervention with Promus DES, there is a 0% residual stenosis.   Mid LAD lesion, 75% stenosed. Post FFR Guided intervention with Promus DES (overlapping proximal stent), there is a 0% residual stenosis.   Moderate in-stent restenosis of proximal LAD stent. Minimal in-stent restenosis of proximal and distal RCA stents. Successful two-vessel PCI of the mid RCA and mid LAD with DES stents.  Plan:   Transfer to [facility] post procedure unit   I have converted from Plavix to Brilinta -- okay to stop aspirin after 3 months   Continue risk factor modification.   Expected discharge tomorrow.             Review of the above records demonstrates: previous h/o CAD s/p DES to LAD in 11/2001, PCI to [facility] 04/2007, PCI to [facility] 08/2007. Presented with recurrent NSTEMI admitted from 1/2 to 1/4, s/p DES to mid LAD and mid RCA. Enrolled in twilight trial.   ASSESSMENT AND PLAN:  1. CAD s/p DES to mid LAD and mid RCA: currently enrolled in Twilight trial which supply ASA and Brilinta  - h/o CAD s/p DES to LAD in 11/2001, PCI to [place] 04/2007, PCI to [place] 08/2007  - NSTEMI s/p  DES to mid LAD and mid RCA 02/26/2015  - continue ASA and brilinta, no significant complaint. Does have bruising over R medial antecubital area, 2+ radial pulse, no significant swelling to suggest compartment syndrome, occasional discomfort, but no current  pain during examination. Vascular U/S prior to discharge was normal. Will continue to monitor the bruising, instructed patient to seek medical attention if symptom does not improve or worsen in 2-3 weeks.   2. Chronic sinus bradycardia: asymptomatic, avoid AV nodal blocking agent  3. HTN: on amlodipine 2.11m. Not on AV nodal blocking due to baseline bradycardia.  4. HLD:   - 02/26/2015: Cholesterol 197; HDL 41; LDL Cholesterol 127*; Triglycerides 145; VLDL 29  - he is intolerant to multiple statins. Being eval for possible PCSK 9 inhibitors. Has appt to see PharmD tomorrow  5. DM (diabetes mellitus), type 2 with complications   - AW2Bof 7.1 - 07/04/2014.   - pending A1C obtained by Dr. FSarajane Jewsyesterday.   6. Stage III CKD: obtain repeat BMET today   I did not bill for Transition of Care visit since patient has been seen as TOC by PCP on 03/05/2015   Current medicines are reviewed at length with the patient today.  The patient does not have concerns regarding medicines.  The following changes have been made:  no change  Labs/ tests ordered today include: BMET  Orders Placed This Encounter  Procedures  . Basic Metabolic Panel (BMET)  . EKG 12-Lead     Disposition:   FU with Dr. HEllyn Hackin 2 months  Signed, HAlmyra DeforestPA 03/06/2015 8:54 [id number]  [id number]: [phone] Fax: [phone]

## 2015-03-05 NOTE — Progress Notes (Signed)
   Subjective:    Patient ID: Joe Murray, male    DOB: [birth year], 75 y.o.   MRN: [id number]  HPI Here to follow up a hospital stay from 02-25-15 to 02-27-15 for chest pain attributed to unstable angina. Enzymes were positive for a NSTEMI. He had a cardiac cath which revealed significant stenoses of 2 vessels, and both of these were stented. He was sent home on aspirin and Brilinta (as part of the TWILIGHT study). His glucoses were managed with insulin in the hospital but he has not had an A1c checked since May 2016. He is scheduled to follow up with Cardiology next week. He is set to meet with a PharmD later this week to discuss non-statin medications to lower his lipids. He does not tolerate any statins. His BP has been stable. An ECHO in the hospital revealed normal wall motion with an EF of 60-65%. He feels fine and denies any chest pains since coming home.    Review of Systems  Constitutional: Negative.   Respiratory: Negative.   Cardiovascular: Negative.   Endocrine: Negative.   Neurological: Negative.        Objective:   Physical Exam  Constitutional: He is oriented to person, place, and time. He appears well-developed and well-nourished.  Neck: No thyromegaly present.  Cardiovascular: Normal rate, regular rhythm, normal heart sounds and intact distal pulses.   Pulmonary/Chest: Effort normal and breath sounds normal.  Lymphadenopathy:    He has no cervical adenopathy.  Neurological: He is alert and oriented to person, place, and time.          Assessment & Plan:  This is a transitional visit after a recent MI with placement of stents. He is pain free currently. We will check an A1c today to follow his diabetes. They will follow his BP at home closely. He will meet with the PharmD to discuss lipid management. He will follow up with Cardiology as scheduled.

## 2015-03-05 NOTE — Progress Notes (Signed)
Pre visit review using our clinic review tool, if applicable. No additional management support is needed unless otherwise documented below in the visit note. 

## 2015-03-06 ENCOUNTER — Ambulatory Visit (INDEPENDENT_AMBULATORY_CARE_PROVIDER_SITE_OTHER): Payer: Medicare HMO | Admitting: Physician Assistant

## 2015-03-06 ENCOUNTER — Encounter: Payer: Self-pay | Admitting: Physician Assistant

## 2015-03-06 DIAGNOSIS — R69 Illness, unspecified: Secondary | ICD-10-CM | POA: Diagnosis not present

## 2015-03-06 DIAGNOSIS — Z9861 Coronary angioplasty status: Secondary | ICD-10-CM | POA: Diagnosis not present

## 2015-03-06 DIAGNOSIS — I2510 Atherosclerotic heart disease of native coronary artery without angina pectoris: Secondary | ICD-10-CM

## 2015-03-06 LAB — BASIC METABOLIC PANEL
BUN: 18 mg/dL (ref 7–25)
CHLORIDE: 101 mmol/L (ref 98–110)
CO2: 21 mmol/L (ref 20–31)
Calcium: 9.6 mg/dL (ref 8.6–10.3)
Creat: 1.18 mg/dL (ref 0.70–1.18)
GLUCOSE: 121 mg/dL — AB (ref 65–99)
POTASSIUM: 4.3 mmol/L (ref 3.5–5.3)
Sodium: 133 mmol/L — ABNORMAL LOW (ref 135–146)

## 2015-03-06 LAB — VITAMIN D 25 HYDROXY (VIT D DEFICIENCY, FRACTURES): VITD: 108.47 ng/mL (ref 30.00–100.00)

## 2015-03-06 NOTE — Patient Instructions (Addendum)
Medication Instructions:  Your physician recommends that you continue on your current medications as directed. Please refer to the Current Medication list given to you today.   Labwork: TODAY:  BMET  Testing/Procedures: None ordered  Follow-Up: Your physician recommends that you schedule a follow-up appointment in:  2 MONTHS WITH DR. [name]   Any Other Special Instructions Will Be Listed Below (If Applicable).  Continue monitor bruising over the right arm, you can take Tylenol or Tylenol PM to help with discomfort or sleep, bruising likely will improve over the next 2-3 weeks. If symptom persist or worsen, pain out of proportion, increased swelling, or numbness and loss of feeling in the right hand, please give [place] a call or seek medical attention immediately.    If you need a refill on your cardiac medications before your next appointment, please call your pharmacy.

## 2015-03-07 ENCOUNTER — Ambulatory Visit (INDEPENDENT_AMBULATORY_CARE_PROVIDER_SITE_OTHER): Payer: Medicare HMO | Admitting: Pharmacist Clinician (PhC)/ Clinical Pharmacy Specialist

## 2015-03-07 ENCOUNTER — Encounter: Payer: Self-pay | Admitting: Pharmacist Clinician (PhC)/ Clinical Pharmacy Specialist

## 2015-03-07 ENCOUNTER — Telehealth: Payer: Self-pay | Admitting: *Deleted

## 2015-03-07 VITALS — Ht 70.0 in | Wt 222.0 lb

## 2015-03-07 DIAGNOSIS — E785 Hyperlipidemia, unspecified: Secondary | ICD-10-CM

## 2015-03-07 NOTE — Telephone Encounter (Signed)
Per [name], PA-C, called pt, spoke with pt's wife, and advised her that his kidney function was stable since his heart cath.  Pt's wife verbalized understanding.

## 2015-03-07 NOTE — Progress Notes (Signed)
03/07/2015 BAER HINTON 04/10/1940 [id number]   HPI:  Joe Murray is a 75 y.o. male patient of Dr Ellyn Hack, who presents today for a lipid clinic evaluation.  He brings his wife with him today.  He has just been released from [facility] on Jan 4 after a NSTEMI on Jan 2.  He is feeling better today, although he does complain of pain in his right forearm.  Apparently he developed a hematoma after the heart cath.  He states this is improving, but still tender.  He had been involved in a drug trial with [employer] for a CTEP inhibitor, but that study ended in November.    RF:  CAD post PCI with stents in 10/03, 3/09 and 7/09, NSTEMI 10/03 and 1/17  Meds: none currently   Intolerant/previously tried: Crestor 5 mg weekly, stopped after 2 doses because of memory problems; ezetimibe 2006-2010 (didn't reach LDL goal), Lescol (02/2002 - 09/2002) was d/c due to memory loss; chart notes that prior to 2002 patient had tried both Lipitor x 2 months and Zocor x 1 month, both of which caused myalgias.  Family history:father had first MI before 12, died from second MI at 66. 2 brothers both deceased from MI's, in their late 1s  Social history:  Smokes 1.5 cigars per day; drinks wine or beer 3-4 times per month  Diet: is trying to improve, eats sausage biscuits about once weekly now, was eating daily much of last year; trying to eat more oatmeal; admits to eating lots of breads, potatoes and pastas  Exercise: is trying to get set up for cardiac rehab now    Labs: 02/2015-  TC 197, TG 145, HDL 41, LDL 127 (no medications)  06/2014 - TC 222, TG 187, HDL 41.9, LDL 143  06/2013 - TC 255, TG 129, HDL 131.7, LDL 98 06/2012 - TC 253, TG 112, HDL 132.8, LDL 75   Current Outpatient Prescriptions  Medication Sig Dispense Refill  . AMBULATORY NON FORMULARY MEDICATION Take 90 mg by mouth 2 (two) times daily. Medication Name: BRILINTA 90 mg BID ([employer] study provided do not fill)    . AMBULATORY  NON FORMULARY MEDICATION Take 81 mg by mouth daily. Medication Name: ASA 81 mg Daily ([employer] Research Study Provided)    . amLODipine (NORVASC) 2.5 MG tablet Take 1 tablet (2.5 mg total) by mouth daily. 90 tablet 3  . Cholecalciferol (VITAMIN D3) 5000 UNITS CAPS Take 5,000 Units by mouth daily. 30 capsule 0  . Cinnamon 500 MG capsule Take 2,000 mg by mouth daily.     Marland Kitchen donepezil (ARICEPT) 5 MG tablet TAKE ONE TABLET BY MOUTH AT BEDTIME 90 tablet 3  . fish oil-omega-3 fatty acids 1000 MG capsule Take 2 g by mouth daily.      Marland Kitchen glipiZIDE (GLUCOTROL) 5 MG tablet TAKE ONE TABLET BY MOUTH TWICE DAILY BEFORE A MEAL 180 tablet 3  . glucose blood (ONE TOUCH ULTRA TEST) test strip Test once a day and diagnosis code is E 11.9 100 each 0  . Lancets (BD LANCET ULTRAFINE 30G) MISC Test once per day and diagnosis code is E 11.9 100 each 0  . MAGNESIUM CHLORIDE PO Take 1 tablet by mouth daily.      . meclizine (ANTIVERT) 25 MG tablet TAKE ONE TABLET BY MOUTH THREE TIMES DAILY AS NEEDED FOR DIZZINESS 90 tablet 0  . nitroGLYCERIN (NITROSTAT) 0.4 MG SL tablet Place 0.4 mg under the tongue every 5 (five) minutes as needed for  chest pain.    Marland Kitchen omeprazole (PRILOSEC) 20 MG capsule TAKE ONE CAPSULE BY MOUTH ONCE DAILY 90 capsule 1  . potassium gluconate 595 MG TABS tablet Take 595 mg by mouth.     No current facility-administered medications for this visit.    Allergies  Allergen Reactions  . Statins     REACTION: myalgias  . Sulfonamide Derivatives     unknown    Past Medical History  Diagnosis Date  . Non-STEMI (non-ST elevated myocardial infarction) [facility]) October 2003    Proximal LAD tandem lesions -- long DES stent covering both  . CAD S/P percutaneous coronary angioplasty 10/'03; 3/'09; 7/*09;      a. Dr. Ellyn Hack; '03 - Cypher DES 3.0 mm 33 mm proximal-mid LAD (details D1 with ostial 60%); 3/'09 dRCA 2.75 mm x 13 mm Cypher DES (2.8 mm); 7/'09 pRCA 3.0 mm x 18 m Cypher DES (3.25 mm);; (2009) 2D Echo -  EF 55%, (November 2014) nonischemic Myoview;; b. Promus DES to RCA and mid LAD 02/25/2014  . SVT (supraventricular tachycardia) ([place]) 2003  . Persistent sinus bradycardia   . Chronotropic incompetence 12/2011    For Exertional Dyspnea:  MET Test - EKG-sinus bradycardia, no arrhythmia/ectopy;; treadmill stress test portion of Myoview: Able to achieve 85% maximum heart rate.  . Dyslipidemia, goal LDL below 70     on [employer] study medicaion.  . Diabetes mellitus type 2 in obese (HCC)     CAD  . ED (erectile dysfunction)   . Spasm of esophagus   . Diverticulosis   . Personal history of colonic adenomas 09/06/2012  . Insomnia   . Vertigo, benign positional   . Psoriasis     Resting HR in 54s  . Hemorrhoids 07/2002    Internal and External  . GERD (gastroesophageal reflux disease)     Height '5\' 10"'  (1.778 m), weight 222 lb (100.699 kg).   ASSESSMENT AND PLAN:  Tommy Medal PharmD CPP [facility]

## 2015-03-07 NOTE — Assessment & Plan Note (Signed)
Not at LDL goal of 70.  Most recent LDL 127.  Will pull patient's paper chart to determine which other statin medications he has tried and any adverse events recorded.  Once we obtain that data, will send request for [employer]-9 approval.  In the meantime, he is to continue working on dietary improvements and start cardiac rehab.

## 2015-03-07 NOTE — Telephone Encounter (Signed)
-----   Message from [place], [place] sent at 03/06/2015  7:31 PM EST ----- Kidney function stable since cath. Potassium normal. It seems sodium has always be borderline low, but at current level, it should not cause too much issue. Will monitor for now.

## 2015-03-07 NOTE — Patient Instructions (Signed)
Cholesterol  Cholesterol is a fat. Your body needs a small amount of cholesterol. Cholesterol may build up in your blood vessels. This increases your chance of having a heart attack or stroke.  You cannot feel your cholesterol levels. The only way to know your cholesterol level is high is with a blood test. Keep your test results. Work with your doctor to keep your cholesterol at a good level.  WHAT DO THE TEST RESULTS MEAN?  · Total cholesterol is how much cholesterol is in your blood.  · LDL is bad cholesterol. This is the type that can build up. You want LDL to be low.  · HDL is good cholesterol. It cleans your blood vessels and carries LDL away. You want HDL to be high.  · Triglycerides are fat that the body can burn for energy or store.  WHAT ARE GOOD LEVELS OF CHOLESTEROL?  · Total cholesterol below 200.  · LDL below 100 for people at risk. Below 70 for those at very high risk.  · HDL above 50 is good. Above 60 is best.  · Triglycerides below 150.  HOW CAN I LOWER MY CHOLESTEROL?  · Diet. Follow your diet programs as told by your doctor.    Choose fish, white meat chicken, roasted turkey, or baked turkey. Try not to eat red meat, fried foods, or processed meats such as sausage and lunch meats.    Eat lots of fresh fruits and vegetables.    Choose whole grains, beans, pasta, potatoes, and cereals.    Use only small amounts of olive, corn, or canola oils.    Try not to eat butter, mayonnaise, shortening, or palm kernel oils.    Try not to eat foods with trans fats.    Drink skim or nonfat milk. Eat low-fat or nonfat yogurt and cheeses. Try not to drink whole milk or cream. Try not to eat ice cream, egg yolks, and full-fat cheeses.    Healthy desserts include angel food cake, ginger snaps, animal crackers, hard candy, popsicles, and low-fat or nonfat frozen yogurt. Try not to eat pastries, cakes, pies, and cookies.  · Exercise. Follow your exercise programs as told by your doctor.    Be more active. You can try  gardening, walking, or taking the stairs. Ask your doctor about how you can be more active.  · Medicine. Take medicine as told by your doctor.     This information is not intended to replace advice given to you by your health care provider. Make sure you discuss any questions you have with your health care provider.     Document Released: 05/08/2008 Document Revised: 03/02/2014 Document Reviewed: 11/23/2012  Elsevier Interactive Patient Education ©2016 [employer]

## 2015-03-21 ENCOUNTER — Other Ambulatory Visit: Payer: Self-pay | Admitting: Family Medicine

## 2015-03-28 ENCOUNTER — Encounter: Payer: Self-pay | Admitting: *Deleted

## 2015-03-28 DIAGNOSIS — Z006 Encounter for examination for normal comparison and control in clinical research program: Secondary | ICD-10-CM

## 2015-03-28 NOTE — Progress Notes (Signed)
[employer] [employer] 1 month follow up completed. Patient denies any adverse or bleeding events, states he is taking his medication as directed (read back). Next visit scheduled for 05/14/15 @ 1000. Questions encouraged and answered. I spoke with patients wife and answered some of her questions, gave them my number if they need to change any appointments or more questions.

## 2015-04-02 ENCOUNTER — Encounter: Payer: Self-pay | Admitting: Pharmacist Clinician (PhC)/ Clinical Pharmacy Specialist

## 2015-05-07 ENCOUNTER — Encounter: Payer: Self-pay | Admitting: Cardiology

## 2015-05-07 ENCOUNTER — Ambulatory Visit (INDEPENDENT_AMBULATORY_CARE_PROVIDER_SITE_OTHER): Payer: Medicare HMO | Admitting: Cardiology

## 2015-05-07 VITALS — BP 114/80 | HR 56 | Ht 70.0 in | Wt 213.2 lb

## 2015-05-07 DIAGNOSIS — I4589 Other specified conduction disorders: Secondary | ICD-10-CM

## 2015-05-07 DIAGNOSIS — E118 Type 2 diabetes mellitus with unspecified complications: Secondary | ICD-10-CM

## 2015-05-07 DIAGNOSIS — E669 Obesity, unspecified: Secondary | ICD-10-CM

## 2015-05-07 DIAGNOSIS — Z9861 Coronary angioplasty status: Secondary | ICD-10-CM

## 2015-05-07 DIAGNOSIS — I2510 Atherosclerotic heart disease of native coronary artery without angina pectoris: Secondary | ICD-10-CM | POA: Diagnosis not present

## 2015-05-07 DIAGNOSIS — E785 Hyperlipidemia, unspecified: Secondary | ICD-10-CM

## 2015-05-07 DIAGNOSIS — I214 Non-ST elevation (NSTEMI) myocardial infarction: Secondary | ICD-10-CM

## 2015-05-07 DIAGNOSIS — I25110 Atherosclerotic heart disease of native coronary artery with unstable angina pectoris: Secondary | ICD-10-CM | POA: Diagnosis not present

## 2015-05-07 DIAGNOSIS — I10 Essential (primary) hypertension: Secondary | ICD-10-CM

## 2015-05-07 NOTE — Patient Instructions (Addendum)
NO CHANGES CURRENT MEDICATIONS  KEEP FOLLOWING UP WITH KRISTIN CONCERNING YOUR CHOLESTEROL ( WIFE WANTS APPOINTMENT SOONER THAN LATER TO DISCUSS MEDICATIONS COST)  Your physician recommends that you schedule a follow-up appointment in 4- Pump Back.  If you need a refill on your cardiac medications before your next appointment, please call your pharmacy.

## 2015-05-07 NOTE — Progress Notes (Signed)
PCP: Laurey Morale, MD  Clinic Note: Chief Complaint  Patient presents with  . Follow-up     no chest pain,  occassional shortness of breath, no edema, no pain or cramping in legs,  occassionally has lightheadedness or dizziness, has fatigue  . Coronary Artery Disease    Status post PCI: Recent non-STEMI    HPI: Joe Murray is a 75 y.o. male with a PMH below who presents today for 2 month post MI f/u..-- /p DES to mid RCA and DES to mid LAD on 02/26/2015 Jan 2 - NSTEMI (mild forearm hematoma). -- Echocardiogram obtained on 02/25/2015 showed EF 60-65%, no RWMA, grade 1 DD. He underwent cardiac catheterization on 1/3 which showed a 99% mid RCA lesion treated with Promus DES, 75% mid LAD lesion with positive FFR treated with Promus DES, moderate in-stent restenosis of proximal LAD stent. Afterward, he was placed on aspirin and Brilinta and enrolled in [place] trial.     Joe Murray was last seen on January 11 for initial post hospital/PCI follow-up with how Joe Deforest, PA. The main concern at that time was bruising/hematoma in his radial site. It extended from his radial access site up along the ulnar wall to the antecubital area. This is finally healing now. I last saw him in late December, shortly before his MI.  -- Seen by Joe Murray in Lipid clinic - working on PCSK-9 Inhibitor evaluation.  He was unable to take Crestor, because it was worsening his impending dementia. This improved significantly while off of Crestor, unfortunately his lipids are not as close to being at goal.  Recent Hospitalizations: Non-STEMI in January 2017  Studies Reviewed:  Conclusion - CATH -PCI Feb 26, 2015    Mid RCA lesion, 99% stenosed. Post intervention with Promus DES, there is a 0% residual stenosis.  Mid LAD lesion, 75% stenosed. Post FFR Guided intervention with Promus DES (overlapping proximal stent), there is a 0% residual stenosis.  Moderate in-stent restenosis of proximal LAD stent.  Minimal in-stent restenosis of proximal and distal RCA stents.   Successful two-vessel PCI of the mid RCA and mid LAD with DES stents.     ECHOCARDIOGRAM 07/27/2015:  LV EF: 60% -  65%  ------------------------------------------------------------------- Indications:   Chest pain 786.51.  ------------------------------------------------------------------- History:  PMH: GERD. Coronary artery disease. Angina pectoris. Risk factors: Current tobacco use. Diabetes mellitus. Dyslipidemia.  ------------------------------------------------------------------- Study Conclusions  - Left ventricle: The cavity size was normal. There was moderate concentric LVH. Systolic function was normal. The estimated ejection fraction was in the range of 60% to 65%. Wall motion was normal; there were no regional wall motion abnormalities. Doppler parameters are consistent with abnormal left ventricular relaxation (grade 1 diastolic dysfunction).  Impressions:  - This was a limited study for the evaluation of LVEF that is normal 60-65%, there are no regional wall motion abnormalities.  Interval History: Joe Murray presents today really with no active anginal symptoms of chest tightness or pressure. He is limited by knee pain from that keeps him from being a walk on the treadmill more than 5 minutes. He was wondering if he was going go back to cardiac rehabilitation. This had not been set up. He really denies any resting or exertional chest tightness or pressure. He has occasional gasping for breath episodes that really can't come on more rest than anything else. Is not so much exertional dyspnea as it is this sensation of not able to catch his breath. He denies any PND, orthopnea or  any significant edema.  No PND, orthopnea or edema.  No palpitations,  however he does occasionally have some lightheadedness and Dzziness - that is usually related to having taken his medications. He also notes  little bit of fatigue since his MI. He denies any wkness or syncope/near syncope. No TIA/amaurosis fugax symptoms. No melena, hematochezia, hematuria, or epstaxis. No claudication.  ROS: A comprehensive was performed. Review of Systems  Constitutional: Positive for weight loss (Interventional with dietary adjustments and exercise.) and malaise/fatigue (notably less exercise tolerance than before. he does note some deconditioning.).  HENT: Positive for nosebleeds (he really doesn't notice a true nosebleed, but now then notes when he blows his nose, the via scab coming out).   Respiratory: Positive for shortness of breath. Negative for cough and sputum production.   Cardiovascular: Positive for leg swelling (Stable, mild at the end of the day).  Gastrointestinal: Positive for constipation. Negative for blood in stool and melena.  Genitourinary: Negative for hematuria.  Musculoskeletal: Positive for joint pain (Lateral knee pain as well as hip pain. This limits his activity.).       Hematoma right arm is healed.  Endo/Heme/Allergies: Does not bruise/bleed easily.  Psychiatric/Behavioral: Positive for memory loss (clearly showing signs of short-term memory loss suggestive of early onset dementia. This is somewhat improved off of statins, but there are still significant defects.).  All other systems reviewed and are negative.   Past Medical History  Diagnosis Date  . Non-STEMI (non-ST elevated myocardial infarction) [facility]) October 2003    Proximal LAD tandem lesions -- long DES stent covering both  . CAD S/P percutaneous coronary angioplasty 10/'03; 3/'09; 7/*09;      a. Dr. Ellyn Hack; '03 - Cypher DES 3.0 mm 33 mm proximal-mid LAD (details D1 with ostial 60%); 3/'09 dRCA 2.75 mm x 13 mm Cypher DES (2.8 mm); 7/'09 pRCA 3.0 mm x 18 m Cypher DES (3.25 mm);; (2009) 2D Echo - EF 55%, (November 2014) nonischemic Myoview;; b. Promus DES to RCA and mid LAD 02/25/2014  . SVT (supraventricular tachycardia)  ([place]) 2003  . Persistent sinus bradycardia   . Chronotropic incompetence 12/2011    For Exertional Dyspnea:  MET Test - EKG-sinus bradycardia, no arrhythmia/ectopy;; treadmill stress test portion of Myoview: Able to achieve 85% maximum heart rate.  . Dyslipidemia, goal LDL below 70     on [employer] study medicaion.  . Diabetes mellitus type 2 in obese (HCC)     CAD  . ED (erectile dysfunction)   . Spasm of esophagus   . Diverticulosis   . Personal history of colonic adenomas 09/06/2012  . Insomnia   . Vertigo, benign positional   . Psoriasis     Resting HR in 54s  . Hemorrhoids 07/2002    Internal and External  . GERD (gastroesophageal reflux disease)     Past Surgical History  Procedure Laterality Date  . Shoulder surgery      left rotator cuff, Dr. Gladstone Lighter  . Wrist fracture surgery      right  . Coronary angioplasty with stent placement  LAD - 2003, RCA 3 & 7/ '09    Cypher 3.0 x 32 mid LAD; Cyper 2.75 x 13 - distal RCA, 3.0 x 18 Prox RCA  . Esophagogastroduodenoscopy  08-11-02    esophageal dilation per Dr. Carlean Purl  . Colonoscopy  08-31-12    per Dr. Carlean Purl, adenomatous polyps, repeat in 3 years   . Cataract extraction  [id number]  . Hernia repair  right inguinal   . Percutaneous coronary stent intervention (pci-s)  11/23/2001    NSTEMI: Prox-Mid LAD tandem ~80% lesions on either side of D1 (with 80% lesion) -- Cypher DES 3.0 mm x 33 m (covering both lesions)   . Percutaneous coronary stent intervention (pci-s)  04/27/2007    Unstable Angina: Distal RCA 95%: Cypher 2.75x13  (2.8 mm); residual focal ~60%ISR in LAD stent, 60% D1 ostial (no PTCA on LAD due to no ischemia on ST)  . Percutaneous coronary stent intervention (pci-s)  09/21/2007    Bradycardia & Unstable Angina: Prox RCA 70% - PCI Cypher DES 3.0 mm x 18 mm  (3.25 mm); ostial 60-70% jailed D1. LAD & distal RCA stents patent  . Nm myoview ltd  Nov 2014    ~8 METS; EF 60%, no ischemia or infarction  . Transthoracic  echocardiogram  March 2009    Normal LV size and function, EF 55%. Mild MR; mild RV dilation.  . Cardiac catheterization N/A 02/26/2015    Procedure: Left Heart Cath and Coronary Angiography;  Surgeon: Leonie Man, MD;  Location: [facility];  Service: Cardiovascular;  Laterality: N/A;  . Cardiac catheterization N/A 02/26/2015    Procedure: Coronary Stent Intervention;  Surgeon: Leonie Man, MD;  Location: [facility];  Service: Cardiovascular;  Laterality: N/A;  . Cardiac catheterization N/A 02/26/2015    Procedure: Intravascular Pressure Wire/FFR Study;  Surgeon: Leonie Man, MD;  Location: [facility];  Service: Cardiovascular;  Laterality: N/A;   Prior to Admission medications   Medication Sig Start Date End Date Taking? Authorizing Provider  AMBULATORY NON FORMULARY MEDICATION Take 90 mg by mouth 2 (two) times daily. Medication Name: BRILINTA 90 mg BID (TWILIGHT Research study provided do not fill) 02/27/15   Burnell Blanks, MD  AMBULATORY NON FORMULARY MEDICATION Take 81 mg by mouth daily. Medication Name: ASA 81 mg Daily (TWILIGHT Research Study Provided) 02/27/15   Burnell Blanks, MD  amLODipine (NORVASC) 2.5 MG tablet Take 1 tablet (2.5 mg total) by mouth daily. 02/27/15   Joe Deforest, PA  Cholecalciferol (VITAMIN D3) 5000 UNITS CAPS Take 5,000 Units by mouth daily. 06/25/11   Laurey Morale, MD  Cinnamon 500 MG capsule Take 2,000 mg by mouth daily.     Historical Provider, MD  donepezil (ARICEPT) 5 MG tablet TAKE ONE TABLET BY MOUTH AT BEDTIME 10/19/14   Laurey Morale, MD  fish oil-omega-3 fatty acids 1000 MG capsule Take 2 g by mouth daily.      Historical Provider, MD  glipiZIDE (GLUCOTROL) 5 MG tablet TAKE ONE TABLET BY MOUTH TWICE DAILY BEFORE A MEAL 01/02/15   Laurey Morale, MD  glucose blood (ONE TOUCH ULTRA TEST) test strip Test once a day and diagnosis code is E 11.9 03/05/15   Laurey Morale, MD  Lancets (BD LANCET ULTRAFINE 30G) MISC Test once per  day and diagnosis code is E 11.9 03/05/15   Laurey Morale, MD  MAGNESIUM CHLORIDE PO Take 1 tablet by mouth daily.      Historical Provider, MD  meclizine (ANTIVERT) 25 MG tablet TAKE ONE TABLET BY MOUTH THREE TIMES DAILY AS NEEDED FOR DIZZINESS 09/14/14   Leonie Man, MD  nitroGLYCERIN (NITROSTAT) 0.4 MG SL tablet Place 0.4 mg under the tongue every 5 (five) minutes as needed for chest pain.    Historical Provider, MD  omeprazole (PRILOSEC) 20 MG capsule TAKE ONE CAPSULE BY MOUTH ONCE DAILY  03/21/15   Laurey Morale, MD  potassium gluconate 595 MG TABS tablet Take 595 mg by mouth.    Historical Provider, MD  Taking Brilinta 90 mg PO bid (- Twilight Trial)   Allergies  Allergen Reactions  . Beta Adrenergic Blockers Other (See Comments)    chronotropic incompetence  . Statins     REACTION: myalgias  . Sulfonamide Derivatives     unknown    Social History   Social History  . Marital Status: Married    Spouse Name: N/A  . Number of Children: 4  . Years of Education: N/A   Occupational History  . retired    Social History Main Topics  . Smoking status: Current Every Day Smoker    Types: Cigars    Start date: 07/06/1952  . Smokeless tobacco: Never Used     Comment: 1-2 per day; he says "I really don't inhale"  . Alcohol Use: 0.0 oz/week    0 Standard drinks or equivalent per week  . Drug Use: No  . Sexual Activity: No   Other Topics Concern  . None   Social History Narrative   He is a married, father of 55, grandfather 3.   He still smokes 2-3 cigars per day. States that he "does not really inhale ". He is not really all that it's including, stating that he wants to have his 1 remaining vice.   Exercises only on occasion.   He does various landscaping jobs including cutting wood, and clearing brush.   Family History  Problem Relation Age of Onset  . Diabetes Mother   . Hearing loss Father   . Dementia Father   . Heart attack Father 68    first MI prior to age 39  .  Heart attack Brother      Wt Readings from Last 3 Encounters:  05/07/15 213 lb 3 oz (96.701 kg)  03/07/15 222 lb (100.699 kg)  03/06/15 221 lb (100.245 kg)    PHYSICAL EXAM BP 114/80 mmHg  Pulse 56  Ht _0  (1.778 m)  Wt 213 lb 3 oz (96.701 kg)  BMI 30.59 kg/m2 General appearance: A&Ox3; NAD. Pleasant mood and affect albeit somewhat blunted. Mildly obese. Well groomed Neck: no adenopathy, no carotid bruit, no JVD, supple, symmetrical, trachea midline and thyroid not enlarged, symmetric, no tenderness/mass/nodules Lungs: CTA B., normal percussion bilaterally and Nonlabored Heart: Regular rhythm with a bradycardia rate; S1&S2 normal, no murmur, click, rub or gallop and normal apical impulse Abdomen: soft, NT/ND; bowel sounds normal; no masses, no organomegaly and Mild truncal obesity (weight is improved from 216, BMI 32.5) Extremities: extremities normal, atraumatic, no cyanosis or edema and varicose veins noted Pulses: 2+ and symmetric Neurologic: Grossly normal    Adult ECG Report Not checked  Other studies Reviewed: Additional studies/ records that were reviewed today include:  Recent Labs:   Lab Results  Component Value Date   CHOL 197 02/26/2015   HDL 41 02/26/2015   LDLCALC 127* 02/26/2015   LDLDIRECT 85.5 06/27/2012   TRIG 145 02/26/2015   CHOLHDL 4.8 02/26/2015    ASSESSMENT / PLAN: Problem List Items Addressed This Visit    Obesity (BMI 30-39.9) (Chronic)    Congratulated his efforts -- encouraged to continue. 10 pounds in a couple months is perhaps too fast, but as long as he keeps it off were okay.      NSTEMI (non-ST elevated myocardial infarction) (HCC) (Chronic)    Thankfully his EF remains preserved with  60-65% on echocardiogram. No regional wall motion abnormality is noted suggesting that significant damage was not done.      Essential hypertension (Chronic)    Currently well-controlled with simply using amlodipine. Heart rate room for beta  blocker. Concerns were baseline renal disease, therefore not on ACE inhibitor or ARB. Could consider if blood pressure would tolerate.      Dyslipidemia, goal LDL below 70 (Chronic)    Was doing well on a Pharm[employer] trial - now that the study was discontinued, lipids have progressed back to out of control.  He is to continue f/u with Joe Murray, RPH-CCP with our [facility]  "Lipid Management "Clinic" that is [facility]      DM (diabetes mellitus), type 2 with complications - CAD (Chronic)    Approximately strict importance of glycemic control to avoid further progression of disease. As directed by PCP. Would shoot for strict goals.      Coronary artery disease involving native coronary artery of native heart with unstable angina pectoris ([place]) (Chronic)    He is now had several episodes of acute coronary syndrome with significant lesions causing recurrent anginal symptoms. No further symptoms since his last PCI. Unable to tolerate statins for memory issues and myalgias. Currently being referred to lipid clinic       Chronotropic incompetence (Chronic)    With baseline bradycardia, not really able to titrate up the AV nodal agents. Needed to ARB and the existing amlodipine plus BiDil.      CAD S/P percutaneous coronary angioplasty - LAD, & RCA x 2 - Primary (Chronic)    Interestingly, when I last saw him prior to his MI that was a couple days before his MI and he was asymptomatic. He now currently is also a symptomatically for cardiac standpoint. He remains on twilight study provided Brilinta as well as amlodipine. I switched him from Plavix to Brilinta. Currently on beta blocker or ACE inhibitor due to blood pressure issues. Could potentially consider ACE inhibitor/ARB if blood pressure were to increase.  For now will continue current regimen. I had not use beta blockers due to baseline bradycardia and fatigue.         Current medicines are reviewed at length with the  patient today. (+/- concerns) Still unable to take Crestor.  Concerned about $$ of praluent/repatha.  The following changes have been made:  NO CHANGES CURRENT MEDICATIONS  KEEP FOLLOWING UP WITH KRISTIN CONCERNING YOUR CHOLESTEROL ( WIFE WANTS APPOINTMENT SOONER THAN LATER TO DISCUSS MEDICATIONS COST)  Your physician recommends that you schedule a follow-up appointment in 4- [place] [name].  Studies Ordered:   No orders of the defined types were placed in this encounter.      Leonie Man, M.D., M.S. Interventional Cardiologist   Pager # [phone] Phone # [phone] [address] [place], [place] [address]

## 2015-05-08 ENCOUNTER — Encounter: Payer: Self-pay | Admitting: Cardiology

## 2015-05-08 NOTE — Assessment & Plan Note (Signed)
Approximately strict importance of glycemic control to avoid further progression of disease. As directed by PCP. Would shoot for strict goals.

## 2015-05-08 NOTE — Assessment & Plan Note (Signed)
Congratulated his efforts -- encouraged to continue. 10 pounds in a couple months is perhaps too fast, but as long as he keeps it off were okay.

## 2015-05-08 NOTE — Assessment & Plan Note (Signed)
Currently well-controlled with simply using amlodipine. Heart rate room for beta blocker. Concerns were baseline renal disease, therefore not on ACE inhibitor or ARB. Could consider if blood pressure would tolerate.

## 2015-05-08 NOTE — Assessment & Plan Note (Signed)
Interestingly, when I last saw him prior to his MI that was a couple days before his MI and he was asymptomatic. He now currently is also a symptomatically for cardiac standpoint. He remains on twilight study provided Brilinta as well as amlodipine. I switched him from Plavix to Brilinta. Currently on beta blocker or ACE inhibitor due to blood pressure issues. Could potentially consider ACE inhibitor/ARB if blood pressure were to increase.  For now will continue current regimen. I had not use beta blockers due to baseline bradycardia and fatigue.

## 2015-05-08 NOTE — Assessment & Plan Note (Signed)
With baseline bradycardia, not really able to titrate up the AV nodal agents. Needed to ARB and the existing amlodipine plus BiDil.

## 2015-05-08 NOTE — Assessment & Plan Note (Signed)
He is now had several episodes of acute coronary syndrome with significant lesions causing recurrent anginal symptoms. No further symptoms since his last PCI. Unable to tolerate statins for memory issues and myalgias. Currently being referred to lipid clinic

## 2015-05-08 NOTE — Assessment & Plan Note (Signed)
Was doing well on a PharmQest trial - now that the study was discontinued, lipids have progressed back to out of control.  He is to continue f/u with Tommy Medal, RPH-CCP with our [facility]  "Lipid Management "Clinic" that is [facility]

## 2015-05-08 NOTE — Assessment & Plan Note (Signed)
Thankfully his EF remains preserved with 60-65% on echocardiogram. No regional wall motion abnormality is noted suggesting that significant damage was not done.

## 2015-05-14 ENCOUNTER — Encounter: Payer: Self-pay | Admitting: *Deleted

## 2015-05-14 ENCOUNTER — Other Ambulatory Visit: Payer: Self-pay | Admitting: *Deleted

## 2015-05-14 DIAGNOSIS — Z006 Encounter for examination for normal comparison and control in clinical research program: Secondary | ICD-10-CM

## 2015-05-14 MED ORDER — AMBULATORY NON FORMULARY MEDICATION: 81.0000 mg | Freq: Every day | Status: DC

## 2015-05-14 NOTE — Progress Notes (Signed)
TWILIGHT Research 3 month Randomization visit completed. Patient denies any adverse or bleeding events. Has missed 2 pm doses of Brilinta. Returned Bottle # A [id number]  23 pills returned & T [id number] 61 pills returned. Randomized today to ASA 81 mg or PLACEBO. Instructed to not take any open label ASA only take study provided ASA and Brilinta. Verbalized understanding and a copy of visit schedule window given to patient and Wife. Questions encouraged and answered.

## 2015-05-16 ENCOUNTER — Encounter: Payer: Self-pay | Admitting: Pharmacist Clinician (PhC)/ Clinical Pharmacy Specialist

## 2015-05-16 ENCOUNTER — Ambulatory Visit (INDEPENDENT_AMBULATORY_CARE_PROVIDER_SITE_OTHER): Payer: Medicare HMO | Admitting: Pharmacist Clinician (PhC)/ Clinical Pharmacy Specialist

## 2015-05-16 VITALS — Ht 70.0 in | Wt 214.0 lb

## 2015-05-16 DIAGNOSIS — E785 Hyperlipidemia, unspecified: Secondary | ICD-10-CM

## 2015-05-16 MED ORDER — EVOLOCUMAB 140 MG/ML ~~LOC~~ SOAJ: 140.0000 mg | SUBCUTANEOUS | Status: DC

## 2015-05-16 NOTE — Assessment & Plan Note (Signed)
Patient and wife are willing to pay for the Repatha, but want to use it only every other month.  I suggested that if they do that, I can supply them with samples on the other months, so that he will have continuously.  Patient was given first dose in the office today.  Wife to call after second dose from [facility] and I will get her samples as well as repeat lab work.  Will have him do labs after 6th dose.

## 2015-05-16 NOTE — Progress Notes (Signed)
05/16/2015 Joe Murray May 24, 1940 [id number]   HPI:  Joe Murray is a 75 y.o. male patient of Dr Ellyn Hack, who presents today for a lipid clinic follow up.  He brings his wife with him today.  I saw him last in January, shortly after his second MI.   At that time we talked about his options and sent in an application for Repatha.  It was approved by his insurance, with a copay of $450/month.  He had previously been involved in a drug trial with [employer] for a CTEP inhibitor, but that study ended in November.  He is interested in the CLEAR trial with [name], but his wife doesn't like the chance that he could be on the placebo.  Patient is also willing to go back on simvastatin, which he thought impaired his memory, but his memory did not improve much after stopping.    RF:  CAD post PCI with stents in 10/03, 3/09 and 7/09, NSTEMI 10/03 and 1/17  Meds: none currently   Intolerant/previously tried: Crestor 5 mg weekly, stopped after 2 doses because of memory problems; ezetimibe 2006-2010 (didn't reach LDL goal), Lescol (02/2002 - 09/2002) was d/c due to memory loss; chart notes that prior to 2002 patient had tried both Lipitor x 2 months and Zocor x 1 month, both of which caused myalgias.  Family history:father had first MI before 65, died from second MI at 28. 2 brothers both deceased from MI's, in their late 60s  Social history:  Smokes 1.5 cigars per day; drinks wine or beer 3-4 times per month  Diet: is trying to improve, eats sausage biscuits about once weekly now, was eating daily much of last year; trying to eat more oatmeal; admits to eating lots of breads, potatoes and pastas  Exercise: is trying to get set up for cardiac rehab now    Labs: 02/2015-  TC 197, TG 145, HDL 41, LDL 127 (no medications)  06/2014 - TC 222, TG 187, HDL 41.9, LDL 143  06/2013 - TC 255, TG 129, HDL 131.7, LDL 98 06/2012 - TC 253, TG 112, HDL 132.8, LDL 75   Current Outpatient Prescriptions   Medication Sig Dispense Refill  . AMBULATORY NON FORMULARY MEDICATION Take 90 mg by mouth 2 (two) times daily. Medication Name: BRILINTA 90 mg BID (TWILIGHT Research study provided do not fill)    . AMBULATORY NON FORMULARY MEDICATION Take 81 mg by mouth daily. Medication Name: ASA 81 mg daily or PLACEBO (TWILIGHT Research Study)    . amLODipine (NORVASC) 2.5 MG tablet Take 1 tablet (2.5 mg total) by mouth daily. 90 tablet 3  . Cholecalciferol (VITAMIN D3) 5000 UNITS CAPS Take 5,000 Units by mouth daily. 30 capsule 0  . Cinnamon 500 MG capsule Take 2,000 mg by mouth daily.     Marland Kitchen donepezil (ARICEPT) 5 MG tablet TAKE ONE TABLET BY MOUTH AT BEDTIME 90 tablet 3  . fish oil-omega-3 fatty acids 1000 MG capsule Take 2 g by mouth daily.      Marland Kitchen glipiZIDE (GLUCOTROL) 5 MG tablet TAKE ONE TABLET BY MOUTH TWICE DAILY BEFORE A MEAL 180 tablet 3  . glucose blood (ONE TOUCH ULTRA TEST) test strip Test once a day and diagnosis code is E 11.9 100 each 0  . Lancets (BD LANCET ULTRAFINE 30G) MISC Test once per day and diagnosis code is E 11.9 100 each 0  . MAGNESIUM CHLORIDE PO Take 1 tablet by mouth daily.      Marland Kitchen  meclizine (ANTIVERT) 25 MG tablet TAKE ONE TABLET BY MOUTH THREE TIMES DAILY AS NEEDED FOR DIZZINESS 90 tablet 0  . nitroGLYCERIN (NITROSTAT) 0.4 MG SL tablet Place 0.4 mg under the tongue every 5 (five) minutes as needed for chest pain.    Marland Kitchen omeprazole (PRILOSEC) 20 MG capsule TAKE ONE CAPSULE BY MOUTH ONCE DAILY 90 capsule 1  . potassium gluconate 595 MG TABS tablet Take 595 mg by mouth.     No current facility-administered medications for this visit.    Allergies  Allergen Reactions  . Beta Adrenergic Blockers Other (See Comments)    chronotropic incompetence  . Statins     REACTION: myalgias  . Sulfonamide Derivatives     unknown    Past Medical History  Diagnosis Date  . Non-STEMI (non-ST elevated myocardial infarction) [facility]) October 2003    Proximal LAD tandem lesions -- long DES  stent covering both  . CAD S/P percutaneous coronary angioplasty 10/'03; 3/'09; 7/*09;      a. Dr. Ellyn Hack; '03 - Cypher DES 3.0 mm 33 mm proximal-mid LAD (details D1 with ostial 60%); 3/'09 dRCA 2.75 mm x 13 mm Cypher DES (2.8 mm); 7/'09 pRCA 3.0 mm x 18 m Cypher DES (3.25 mm);; (2009) 2D Echo - EF 55%, (November 2014) nonischemic Myoview;; b. Promus DES to RCA and mid LAD 02/25/2014  . SVT (supraventricular tachycardia) ([place]) 2003  . Persistent sinus bradycardia   . Chronotropic incompetence 12/2011    For Exertional Dyspnea:  MET Test - EKG-sinus bradycardia, no arrhythmia/ectopy;; treadmill stress test portion of Myoview: Able to achieve 85% maximum heart rate.  . Dyslipidemia, goal LDL below 70     on [employer] study medicaion.  . Diabetes mellitus type 2 in obese (HCC)     CAD  . ED (erectile dysfunction)   . Spasm of esophagus   . Diverticulosis   . Personal history of colonic adenomas 09/06/2012  . Insomnia   . Vertigo, benign positional   . Psoriasis     Resting HR in 54s  . Hemorrhoids 07/2002    Internal and External  . GERD (gastroesophageal reflux disease)     Height '5\' 10"'  (1.778 m), weight 214 lb (97.07 kg).   Tommy Medal PharmD CPP [facility]

## 2015-05-16 NOTE — Patient Instructions (Signed)
Start Repatha injections every 2 weeks  Next injection in due on April 6, then April 20.   Call me after April 20 injection and I will get you the next 2 injections.

## 2015-06-26 ENCOUNTER — Telehealth: Payer: Self-pay | Admitting: Cardiology

## 2015-06-26 NOTE — Telephone Encounter (Signed)
New message   The nurse normally has medications ready for pick-up the wife forgot about calling to pick-up today if possible please give her call back at home. Med. name Repatha Altha Harm gives 2 injections)

## 2015-06-26 NOTE — Telephone Encounter (Signed)
Spoke with Pt and instructed him to come by the office to pick up Repatha samples. Told him to have front desk notify us that they were here for [place] sample so we can get them out of the fridge.

## 2015-07-01 ENCOUNTER — Encounter: Payer: Self-pay | Admitting: *Deleted

## 2015-07-01 DIAGNOSIS — Z006 Encounter for examination for normal comparison and control in clinical research program: Secondary | ICD-10-CM

## 2015-07-01 NOTE — Progress Notes (Signed)
TWILIGHT Research study 4 month telephone call completed. Patient denies any bleeding or adverse events. States compliant with medication, may have missed 1 or 2 doses. Questions encouraged and answered.

## 2015-08-09 ENCOUNTER — Other Ambulatory Visit (INDEPENDENT_AMBULATORY_CARE_PROVIDER_SITE_OTHER): Payer: Medicare HMO

## 2015-08-09 ENCOUNTER — Telehealth: Payer: Self-pay | Admitting: *Deleted

## 2015-08-09 DIAGNOSIS — Z0000 Encounter for general adult medical examination without abnormal findings: Secondary | ICD-10-CM

## 2015-08-09 LAB — LIPID PANEL
Cholesterol: 135 mg/dL (ref 0–200)
HDL: 48.4 mg/dL (ref >39.00)
LDL Cholesterol: 59 mg/dL (ref 0–99)
NONHDL: 86.33
TRIGLYCERIDES: 136 mg/dL (ref 0.0–149.0)
Total CHOL/HDL Ratio: 3
VLDL: 27.2 mg/dL (ref 0.0–40.0)

## 2015-08-09 LAB — HEPATIC FUNCTION PANEL
ALBUMIN: 4.1 g/dL (ref 3.5–5.2)
ALT: 11 U/L (ref 0–53)
AST: 13 U/L (ref 0–37)
Alkaline Phosphatase: 61 U/L (ref 39–117)
BILIRUBIN DIRECT: 0.1 mg/dL (ref 0.0–0.3)
BILIRUBIN TOTAL: 0.6 mg/dL (ref 0.2–1.2)
Total Protein: 6.7 g/dL (ref 6.0–8.3)

## 2015-08-09 LAB — POC URINALSYSI DIPSTICK (AUTOMATED)
Bilirubin, UA: NEGATIVE
GLUCOSE UA: NEGATIVE
Leukocytes, UA: NEGATIVE
NITRITE UA: NEGATIVE
SPEC GRAV UA: 1.015
UROBILINOGEN UA: 1
pH, UA: 7

## 2015-08-09 LAB — CBC WITH DIFFERENTIAL/PLATELET
BASOS ABS: 0 10*3/uL (ref 0.0–0.1)
Basophils Relative: 0.4 % (ref 0.0–3.0)
EOS ABS: 0.2 10*3/uL (ref 0.0–0.7)
Eosinophils Relative: 2.6 % (ref 0.0–5.0)
HEMATOCRIT: 42.5 % (ref 39.0–52.0)
HEMOGLOBIN: 14.2 g/dL (ref 13.0–17.0)
LYMPHS PCT: 25.6 % (ref 12.0–46.0)
Lymphs Abs: 2.3 10*3/uL (ref 0.7–4.0)
MCHC: 33.5 g/dL (ref 30.0–36.0)
MCV: 92.3 fl (ref 78.0–100.0)
MONO ABS: 0.6 10*3/uL (ref 0.1–1.0)
Monocytes Relative: 6.4 % (ref 3.0–12.0)
NEUTROS ABS: 5.8 10*3/uL (ref 1.4–7.7)
Neutrophils Relative %: 65 % (ref 43.0–77.0)
PLATELETS: 250 10*3/uL (ref 150.0–400.0)
RBC: 4.6 Mil/uL (ref 4.22–5.81)
RDW: 13.6 % (ref 11.5–15.5)
WBC: 8.9 10*3/uL (ref 4.0–10.5)

## 2015-08-09 LAB — BASIC METABOLIC PANEL
BUN: 17 mg/dL (ref 6–23)
CALCIUM: 9.3 mg/dL (ref 8.4–10.5)
CO2: 26 mEq/L (ref 19–32)
CREATININE: 1.15 mg/dL (ref 0.40–1.50)
Chloride: 104 mEq/L (ref 96–112)
GFR: 65.8 mL/min (ref >60.00)
Glucose, Bld: 126 mg/dL — ABNORMAL HIGH (ref 70–99)
Potassium: 4.5 mEq/L (ref 3.5–5.1)
Sodium: 137 mEq/L (ref 135–145)

## 2015-08-09 LAB — TSH: TSH: 1.69 u[IU]/mL (ref 0.35–4.50)

## 2015-08-09 LAB — MICROALBUMIN / CREATININE URINE RATIO
Creatinine,U: 134.1 mg/dL
Microalb Creat Ratio: 1.3 mg/g (ref 0.0–30.0)
Microalb, Ur: 1.8 mg/dL (ref 0.0–1.9)

## 2015-08-09 LAB — HEMOGLOBIN A1C: Hgb A1c MFr Bld: 6.4 % (ref 4.6–6.5)

## 2015-08-09 LAB — VITAMIN D 25 HYDROXY (VIT D DEFICIENCY, FRACTURES): VITD: 117.4 ng/mL — AB (ref 30.00–100.00)

## 2015-08-09 LAB — PSA: PSA: 5.61 ng/mL — AB (ref 0.10–4.00)

## 2015-08-09 NOTE — Telephone Encounter (Signed)
CRITICAL VALUE STICKER  CRITICAL VALUE:Per Tonya at the [facility] lab office--Vitamin D-117.40  RECEIVER (INITALS):JFunderburk,CMA  DATE & TIME NOTIFIED:08/09/2015--12:49pm MD NOTIFIED:Dr Sarajane Jews  TIME OF NOTIFICATION:12:49pm  RESPONSE:

## 2015-08-09 NOTE — Telephone Encounter (Signed)
This is where his level usually stays. No need to change anything, this is okay

## 2015-08-09 NOTE — Telephone Encounter (Signed)
I printed this message and gave to Dr. Sarajane Jews, waiting on a response.

## 2015-08-14 NOTE — Telephone Encounter (Signed)
Pt has a pending appt to discuss results.

## 2015-08-16 ENCOUNTER — Encounter: Payer: Self-pay | Admitting: Family Medicine

## 2015-08-16 ENCOUNTER — Telehealth: Payer: Self-pay | Admitting: Pharmacist Clinician (PhC)/ Clinical Pharmacy Specialist

## 2015-08-16 ENCOUNTER — Ambulatory Visit (INDEPENDENT_AMBULATORY_CARE_PROVIDER_SITE_OTHER): Payer: Medicare HMO | Admitting: Family Medicine

## 2015-08-16 VITALS — BP 127/72 | HR 54 | Temp 97.6°F | Ht 70.0 in | Wt 198.0 lb

## 2015-08-16 DIAGNOSIS — Z0000 Encounter for general adult medical examination without abnormal findings: Secondary | ICD-10-CM | POA: Diagnosis not present

## 2015-08-16 DIAGNOSIS — Z23 Encounter for immunization: Secondary | ICD-10-CM | POA: Diagnosis not present

## 2015-08-16 DIAGNOSIS — R972 Elevated prostate specific antigen [PSA]: Secondary | ICD-10-CM

## 2015-08-16 NOTE — Telephone Encounter (Signed)
Wife called, LMOM about getting sample of Repatha.  Returned call, spoke with patient, stated we have samples will have 2 ready for patient.  He also asked about delaying his shot by a day or two because of travel, assured him this would be fine.

## 2015-08-16 NOTE — Progress Notes (Signed)
   Subjective:    Patient ID: Joe Murray, male    DOB: 26-Jan-1941, 75 y.o.   MRN: [id number]  HPI 75 yr old male for a well exam. He is doing well and has no concerns. His wife says his memory is fading more all the time but that he seems happy.    Review of Systems  Constitutional: Negative.   HENT: Negative.   Eyes: Negative.   Respiratory: Negative.   Cardiovascular: Negative.   Gastrointestinal: Negative.   Genitourinary: Negative.   Musculoskeletal: Negative.   Skin: Negative.   Neurological: Negative.   Psychiatric/Behavioral: Negative.        Objective:   Physical Exam  Constitutional: He appears well-developed and well-nourished. No distress.  HENT:  Head: Normocephalic and atraumatic.  Right Ear: External ear normal.  Left Ear: External ear normal.  Nose: Nose normal.  Mouth/Throat: Oropharynx is clear and moist. No oropharyngeal exudate.  Eyes: Conjunctivae and EOM are normal. Pupils are equal, round, and reactive to light. Right eye exhibits no discharge. Left eye exhibits no discharge. No scleral icterus.  Neck: Neck supple. No JVD present. No tracheal deviation present. No thyromegaly present.  Cardiovascular: Normal rate, regular rhythm, normal heart sounds and intact distal pulses.  Exam reveals no gallop and no friction rub.   No murmur heard. Pulmonary/Chest: Effort normal and breath sounds normal. No respiratory distress. He has no wheezes. He has no rales. He exhibits no tenderness.  Abdominal: Soft. Bowel sounds are normal. He exhibits no distension and no mass. There is no tenderness. There is no rebound and no guarding.  Genitourinary: Rectum normal, prostate normal and penis normal. Guaiac negative stool. No penile tenderness.  Musculoskeletal: Normal range of motion. He exhibits no edema or tenderness.  Lymphadenopathy:    He has no cervical adenopathy.  Neurological: He is alert. He has normal reflexes. No cranial nerve deficit. He exhibits  normal muscle tone. Coordination normal.  Skin: Skin is warm and dry. No rash noted. He is not diaphoretic. No erythema. No pallor.  Psychiatric: He has a normal mood and affect. His behavior is normal. Judgment and thought content normal.          Assessment & Plan:  Well exam. We discussed diet and exercise. His PSA is elevated so we will refer to Urology.  Laurey Morale, MD

## 2015-08-16 NOTE — Progress Notes (Signed)
Pre visit review using our clinic review tool, if applicable. No additional management support is needed unless otherwise documented below in the visit note. 

## 2015-08-19 DIAGNOSIS — M545 Low back pain: Secondary | ICD-10-CM | POA: Diagnosis not present

## 2015-08-19 DIAGNOSIS — M7061 Trochanteric bursitis, right hip: Secondary | ICD-10-CM | POA: Diagnosis not present

## 2015-08-25 ENCOUNTER — Other Ambulatory Visit: Payer: Self-pay | Admitting: Cardiology

## 2015-08-31 ENCOUNTER — Other Ambulatory Visit: Payer: Self-pay | Admitting: Cardiology

## 2015-09-17 ENCOUNTER — Encounter: Payer: Self-pay | Admitting: Internal Medicine

## 2015-09-19 ENCOUNTER — Encounter: Payer: Self-pay | Admitting: Cardiology

## 2015-09-19 ENCOUNTER — Ambulatory Visit (INDEPENDENT_AMBULATORY_CARE_PROVIDER_SITE_OTHER): Payer: Medicare HMO | Admitting: Cardiology

## 2015-09-19 VITALS — BP 110/70 | HR 54 | Ht 67.0 in | Wt 188.0 lb

## 2015-09-19 DIAGNOSIS — E785 Hyperlipidemia, unspecified: Secondary | ICD-10-CM | POA: Diagnosis not present

## 2015-09-19 DIAGNOSIS — R0602 Shortness of breath: Secondary | ICD-10-CM

## 2015-09-19 DIAGNOSIS — I2510 Atherosclerotic heart disease of native coronary artery without angina pectoris: Secondary | ICD-10-CM | POA: Diagnosis not present

## 2015-09-19 DIAGNOSIS — I10 Essential (primary) hypertension: Secondary | ICD-10-CM

## 2015-09-19 DIAGNOSIS — Z9861 Coronary angioplasty status: Secondary | ICD-10-CM

## 2015-09-19 DIAGNOSIS — E669 Obesity, unspecified: Secondary | ICD-10-CM

## 2015-09-19 DIAGNOSIS — E118 Type 2 diabetes mellitus with unspecified complications: Secondary | ICD-10-CM

## 2015-09-19 NOTE — Progress Notes (Signed)
PCP: Laurey Morale, MD  Clinic Note: Chief Complaint  Patient presents with  . Follow-up    4-6 MONTHS  . Dizziness  . Shortness of Breath    HPI: Joe Murray is a 75 y.o. male with a PMH below who presents today for Four-month follow-up with his history of CAD-PCI.  I last saw him in March 2017 for post non-STEMI-PCI f/u..-- s/p DES to mid RCA and DES to mid LAD on 02/26/2015--> currently enrolled in Twilight study  -- Seen by Tommy Medal in Lipid clinic - now currently on [employer] Inhibitor evaluation.   Recent Hospitalizations: None since January 2017  Studies Reviewed: No new studies   Interval History: Joe Murray presents today really with no active anginal symptoms of chest tightness or pressure. He continues to be limited by knee pain from that keeps him from being a walk on the treadmill pretty much anymore. He states that he tends to get pretty dizzy, especially when he is first getting up to go walking. He has not had any syncope or near syncope.  He really denies any resting or exertional chest tightness or pressure. He states that the gasping for breath episodes of quit, but he does note that he will get short of breath if he walks more than ~100 Yards. He says he can walk at an easy pace around the shop all day without any symptoms, but if he walks straight, he will get short of breath. Is not sure if he is simply having significant amount of arthritic pain or if it's a combination.  He denies any PND, orthopnea or any significant edema.  No palpitations,  however he does occasionally have some lightheadedness and Dzziness - that is usually related to having taken his medications. He still has little bit of fatigue leftover from having had his MI. He denies any weakness or syncope/near syncope. No TIA/amaurosis fugax symptoms. No melena, hematochezia, hematuria, or epstaxis. No claudication.  ROS: A comprehensive was performed. Review of Systems  Constitutional:  Positive for malaise/fatigue (deconditioning & arthritis pain) and weight loss (Interventional with dietary adjustments and exercise.).  HENT: Negative for nosebleeds (he really doesn't notice a true nosebleed, but now then notes when he blows his nose, the via scab coming out).   Respiratory: Positive for shortness of breath. Negative for cough and sputum production.   Cardiovascular: Positive for leg swelling (Stable, mild at the end of the day).  Gastrointestinal: Positive for constipation. Negative for blood in stool and melena.  Genitourinary: Negative for hematuria.  Musculoskeletal: Positive for joint pain (Lateral knee pain as well as hip pain. This limits his activity.).       Hematoma right arm is healed.  Neurological: Positive for dizziness (see HPI).  Endo/Heme/Allergies: Does not bruise/bleed easily.  Psychiatric/Behavioral: Positive for memory loss (clearly showing signs of short-term memory loss suggestive of early onset dementia. This is somewhat improved off of statins, but there are still significant defects.). Negative for depression. The patient is not nervous/anxious and does not have insomnia.   All other systems reviewed and are negative.   Past Medical History:  Diagnosis Date  . CAD S/P percutaneous coronary angioplasty 10/'03; 3/'09; 7/*09; 1/'17    a. Dr. Ellyn Hack; '03 - Cypher DES 3.0 mm 33 mm proximal-mid LAD (details D1 with ostial 60%); 3/'09 dRCA 2.75 mm x 13 mm Cypher DES (2.8 mm); 7/'09 pRCA 3.0 mm x 18 m Cypher DES (3.25 mm);; (2009) 2D Echo - EF 55%, (November 2014)  nonischemic Myoview;; b. Promus DES to RCA and mid LAD 02/26/2015  . Chronotropic incompetence 12/2011   For Exertional Dyspnea:  MET Test - EKG-sinus bradycardia, no arrhythmia/ectopy;; treadmill stress test portion of Myoview: Able to achieve 85% maximum heart rate.  . Diabetes mellitus type 2 in obese (HCC)    CAD  . Diverticulosis   . Dyslipidemia, goal LDL below 70    on [employer] study  medicaion.  . ED (erectile dysfunction)   . GERD (gastroesophageal reflux disease)   . Hemorrhoids 07/2002   Internal and External  . Insomnia   . Non-STEMI (non-ST elevated myocardial infarction) ([place]) 11/2001   a. Proximal LAD tandem lesions -- long DES stent covering both;; b. Jan 2017: mRCA PCI, [place] PCI  . Persistent sinus bradycardia   . Personal history of colonic adenomas 09/06/2012  . Psoriasis    Resting HR in 54s  . Spasm of esophagus   . SVT (supraventricular tachycardia) ([place]) 2003  . Vertigo, benign positional     Past Surgical History:  Procedure Laterality Date  . CARDIAC CATHETERIZATION N/A 02/26/2015   Procedure: Left Heart Cath and Coronary Angiography;  Surgeon: Leonie Man, MD: mRCA 99% --> PCI. mLAD 75%-> FFR Guided PCI, Mod ISR in pLAD & pRCA  . CARDIAC CATHETERIZATION N/A 02/26/2015   Procedure: Coronary Stent Intervention;  Surgeon: Leonie Man, MD;  Location: [facility]: mRCA Promus Premier DES 3.0 x 12 (3.5), mLAD Promus Premier DES 2.75 x 20 (3.0)  . CARDIAC CATHETERIZATION N/A 02/26/2015   Procedure: Intravascular Pressure Wire/FFR Study;  Surgeon: Leonie Man, MD;  Location: [place] [facility];  Service: Cardiovascular: LAD 75% - FFR + -> PCI  . CATARACT EXTRACTION  [id number]  . COLONOSCOPY  08-31-12   per Dr. Carlean Purl, adenomatous polyps, repeat in 3 years   . CORONARY ANGIOPLASTY WITH STENT PLACEMENT  LAD - 2003, RCA 3 & 7/ '09   Cypher 3.0 x 32 mid LAD; Cyper 2.75 x 13 - distal RCA, 3.0 x 18 Prox RCA  . ESOPHAGOGASTRODUODENOSCOPY  08-11-02   esophageal dilation per Dr. Carlean Purl  . HERNIA REPAIR     right inguinal   . NM MYOVIEW LTD  Nov 2014   ~8 METS; EF 60%, no ischemia or infarction  . PERCUTANEOUS CORONARY STENT INTERVENTION (PCI-S)  11/23/2001   NSTEMI: Prox-Mid LAD tandem ~80% lesions on either side of D1 (with 80% lesion) -- Cypher DES 3.0 mm x 33 m (covering both lesions)   . PERCUTANEOUS CORONARY STENT INTERVENTION (PCI-S)   04/27/2007   Unstable Angina: Distal RCA 95%: Cypher 2.75x13  (2.8 mm); residual focal ~60%ISR in LAD stent, 60% D1 ostial (no PTCA on LAD due to no ischemia on ST)  . PERCUTANEOUS CORONARY STENT INTERVENTION (PCI-S)  09/21/2007   Bradycardia & Unstable Angina: Prox RCA 70% - PCI Cypher DES 3.0 mm x 18 mm  (3.25 mm); ostial 60-70% jailed D1. LAD & distal RCA stents patent  . SHOULDER SURGERY     left rotator cuff, Dr. Gladstone Lighter  . TRANSTHORACIC ECHOCARDIOGRAM  March 2009   Normal LV size and function, EF 55%. Mild MR; mild RV dilation.  . TRANSTHORACIC ECHOCARDIOGRAM  02/2015   EF 60-65%. Moderate concentric LVH. Normal function with normal regional wall motion. GR 1 DD  . WRIST FRACTURE SURGERY     right    Prior to Admission medications   Medication Sig Start Date End Date Taking? Authorizing Provider  AMBULATORY NON FORMULARY  MEDICATION Take 90 mg by mouth 2 (two) times daily. Medication Name: BRILINTA 90 mg BID (TWILIGHT Research study provided do not fill) 02/27/15   Burnell Blanks, MD  AMBULATORY NON FORMULARY MEDICATION Take 81 mg by mouth daily. Medication Name: ASA 81 mg daily or PLACEBO (TWILIGHT Research Study) 05/14/15   Burnell Blanks, MD  amLODipine (NORVASC) 2.5 MG tablet Take 1 tablet (2.5 mg total) by mouth daily. 02/27/15   [name], PA  Cholecalciferol (VITAMIN D3) 5000 UNITS CAPS Take 5,000 Units by mouth daily. 06/25/11   Laurey Morale, MD  Cinnamon 500 MG capsule Take 2,000 mg by mouth daily.     Historical Provider, MD  donepezil (ARICEPT) 5 MG tablet TAKE ONE TABLET BY MOUTH AT BEDTIME 10/19/14   Laurey Morale, MD  Evolocumab (REPATHA SURECLICK) 174 MG/ML SOAJ Inject 140 mg into the skin every 14 (fourteen) days. 05/16/15   Leonie Man, MD  fish oil-omega-3 fatty acids 1000 MG capsule Take 2 g by mouth daily.      Historical Provider, MD  glipiZIDE (GLUCOTROL) 5 MG tablet TAKE ONE TABLET BY MOUTH TWICE DAILY BEFORE A MEAL 01/02/15   Laurey Morale, MD  glucose  blood (ONE TOUCH ULTRA TEST) test strip Test once a day and diagnosis code is E 11.9 03/05/15   Laurey Morale, MD  Lancets (BD LANCET ULTRAFINE 30G) MISC Test once per day and diagnosis code is E 11.9 03/05/15   Laurey Morale, MD  MAGNESIUM CHLORIDE PO Take 1 tablet by mouth daily.      Historical Provider, MD  meclizine (ANTIVERT) 25 MG tablet TAKE ONE TABLET BY MOUTH THREE TIMES DAILY AS NEEDED FOR DIZZINESS 09/02/15   Pixie Casino, MD  nitroGLYCERIN (NITROSTAT) 0.4 MG SL tablet Place 0.4 mg under the tongue every 5 (five) minutes as needed for chest pain. Reported on 08/16/2015    Historical Provider, MD  omeprazole (PRILOSEC) 20 MG capsule TAKE ONE CAPSULE BY MOUTH ONCE DAILY 03/21/15   Laurey Morale, MD  potassium gluconate 595 MG TABS tablet Take 595 mg by mouth.    Historical Provider, MD   Taking Brilinta 90 mg PO bid (- Twilight Trial)   Allergies  Allergen Reactions  . Beta Adrenergic Blockers Other (See Comments)    chronotropic incompetence  . Statins     REACTION: myalgias  . Sulfonamide Derivatives     unknown    Social History   Social History  . Marital status: Married    Spouse name: N/A  . Number of children: 4  . Years of education: N/A   Occupational History  . retired    Social History Main Topics  . Smoking status: Current Every Day Smoker    Types: Cigars    Start date: 07/06/1952  . Smokeless tobacco: Never Used     Comment: 1-2 per day; he says "I really don't inhale"  . Alcohol use 0.0 oz/week     Comment: occ  . Drug use: No  . Sexual activity: No   Other Topics Concern  . None   Social History Narrative   He is a married, father of 70, grandfather 3.   He still smokes 2-3 cigars per day. States that he "does not really inhale ". He is not really all that it's including, stating that he wants to have his 1 remaining vice.   Exercises only on occasion.   He does various landscaping jobs including cutting wood,  and clearing brush.   Family  History  Problem Relation Age of Onset  . Diabetes Mother   . Hearing loss Father   . Dementia Father   . Heart attack Father 70    first MI prior to age 100  . Heart attack Brother      Wt Readings from Last 3 Encounters:  09/19/15 188 lb (85.3 kg)  08/16/15 198 lb (89.8 kg)  05/16/15 214 lb (97.1 kg)  - watching diet & exercise - quit eating as much -- more sensible   PHYSICAL EXAM BP 110/70   Pulse (!) 54   Ht 5' 7" (1.702 m)   Wt 188 lb (85.3 kg)   BMI 29.44 kg/m  General appearance: A&Ox3; NAD. Pleasant mood and affect albeit somewhat blunted. Mildly obese. Well groomed Neck: no adenopathy, no carotid bruit, no JVD, supple, symmetrical, trachea midline and thyroid not enlarged, symmetric, no tenderness/mass/nodules Lungs: CTA B., normal percussion bilaterally and Nonlabored Heart: Regular rhythm with a bradycardia rate; S1&S2 normal, no murmur, click, rub or gallop and normal apical impulse Abdomen: soft, NT/ND; bowel sounds normal; no masses, no organomegaly and Mild truncal obesity (weight is improved from 216, BMI 32.5) Extremities: extremities normal, atraumatic, no cyanosis or edema and varicose veins noted Pulses: 2+ and symmetric Neurologic: Grossly normal    Adult ECG Report Not checked  Other studies Reviewed: Additional studies/ records that were reviewed today include:  Recent Labs:   Lab Results  Component Value Date   CHOL 135 08/09/2015   HDL 48.40 08/09/2015   LDLCALC 59 08/09/2015   LDLDIRECT 85.5 06/27/2012   TRIG 136.0 08/09/2015   CHOLHDL 3 08/09/2015    ASSESSMENT / PLAN: Problem List Items Addressed This Visit    Obesity (BMI 30-39.9) (Chronic)    He actually has done a great job losing weight, and is now below the "obesity threshold ". He graduated him on his efforts. Despite his having difficulty walking, he is really make an effort to diet      Exertional shortness of breath    This seems to be relatively chronic. I've been  commenting on it for about 3 years now. Likely focal multifactorial and at least part of it being related to his arthritic pains.      Essential hypertension (Chronic)    Really know more of an issue. He has orthostatic dizziness type symptoms. Not on beta blocker or ACE inhibitor for that reason. At this point I think we'll have him take his amlodipine at nighttime to see if that helps with some of his dizziness during the day, if it doesn't, I told him to give a trial of 2 weeks off that altogether. At that helps his dizziness, I think we should just Stop it.      Dyslipidemia, goal LDL below 70 (Chronic)    Outstanding results with him his overall lipid control. LDL now at goal as his HDL. Continuing on Repatha. We are assisting with their ability to use every month as opposed to every other month. Providing samples when able.  Being followed by Tommy Medal, RPH-CCP & our clinical pharmacist team      DM (diabetes mellitus), type 2 with complications - CAD - Primary (Chronic)   CAD S/P percutaneous coronary angioplasty - LAD, & RCA x 2 (Chronic)    Overall doing well with no active anginal symptoms since last 2 vessel PCI. Remains on Twilight study with Brilinta plus or minus aspirin. Not on beta blocker  due to baseline bradycardia. Not ACE inhibitor/ARB because of overlying blood pressures and dizziness. In fact, I'm thinking of weaning off of amlodipine altogether to allow him to have a more steady gait with stable blood pressures. Lipid control with Repatha       Other Visit Diagnoses   None.     Current medicines are reviewed at length with the patient today. (+/- concerns) Still unable to take Crestor.  Concerned about $$ of praluent/repatha.  The following changes have been made:   Take the amlodipine at night ( after dinner) for 2 weeks.  After the 2 weeks, then hold it for 2 weeks. If your dizzy symptoms get better, then continue to hold. Call back to let Dr Ellyn Hack  know and get further instructions.   KEEP FOLLOWING UP WITH KRISTIN CONCERNING YOUR CHOLESTEROL   Your physician recommends that you schedule a follow-up appointment in January 2018 [place].  Studies Ordered:   No orders of the defined types were placed in this encounter.     Glenetta Hew, M.D., M.S. Interventional Cardiologist   Pager # [phone] Phone # [phone] [place], [place] [address]

## 2015-09-19 NOTE — Patient Instructions (Signed)
Your physician has recommended you make the following change in your medication:   1.) Dr Ellyn Hack recommends that you take the amlodipine at night ( after dinner) for 2 weeks.  After the 2 weeks, then hold it for 2 weeks. If your dizzy symptoms get better, then continue to hold. Call back to let Dr Ellyn Hack know and get further instructions.   Your physician recommends that you schedule a follow-up appointment in: January 2018.

## 2015-09-21 ENCOUNTER — Encounter: Payer: Self-pay | Admitting: Cardiology

## 2015-09-21 NOTE — Assessment & Plan Note (Signed)
This seems to be relatively chronic. I've been commenting on it for about 3 years now. Likely focal multifactorial and at least part of it being related to his arthritic pains.

## 2015-09-21 NOTE — Assessment & Plan Note (Signed)
He actually has done a great job losing weight, and is now below the "obesity threshold ". He graduated him on his efforts. Despite his having difficulty walking, he is really make an effort to diet

## 2015-09-21 NOTE — Assessment & Plan Note (Signed)
Really know more of an issue. He has orthostatic dizziness type symptoms. Not on beta blocker or ACE inhibitor for that reason. At this point I think we'll have him take his amlodipine at nighttime to see if that helps with some of his dizziness during the day, if it doesn't, I told him to give a trial of 2 weeks off that altogether. At that helps his dizziness, I think we should just Stop it.

## 2015-09-21 NOTE — Assessment & Plan Note (Signed)
Overall doing well with no active anginal symptoms since last 2 vessel PCI. Remains on Twilight study with Brilinta plus or minus aspirin. Not on beta blocker due to baseline bradycardia. Not ACE inhibitor/ARB because of overlying blood pressures and dizziness. In fact, I'm thinking of weaning off of amlodipine altogether to allow him to have a more steady gait with stable blood pressures. Lipid control with Repatha

## 2015-09-21 NOTE — Assessment & Plan Note (Signed)
Outstanding results with him his overall lipid control. LDL now at goal as his HDL. Continuing on Repatha. We are assisting with their ability to use every month as opposed to every other month. Providing samples when able.  Being followed by Tommy Medal, RPH-CCP & our clinical pharmacist team

## 2015-10-07 ENCOUNTER — Other Ambulatory Visit: Payer: Self-pay | Admitting: Cardiology

## 2015-10-07 MED ORDER — EVOLOCUMAB 140 MG/ML ~~LOC~~ SOAJ: 140.0000 mg | SUBCUTANEOUS | 12 refills | Status: DC

## 2015-10-07 NOTE — Telephone Encounter (Signed)
Returned call to pt wife. Informed her that we just received fax from [facility] with information that needs to be updated. We will send this off this afternoon.   Stated that pt has been without medication for >1 month. Will provide samples to get through until insurance is updated.   She states she understand and will come to get samples.

## 2015-10-07 NOTE — Telephone Encounter (Signed)
°*  STAT* If patient is at the pharmacy, call can be transferred to refill team.   1. Which medications need to be refilled? (please list name of each medication and dose if known) -Evolocumab 140 mg need this riight away-pt been without this for a month 2. Which pharmacy/location (including street and city if local pharmacy) is medication to be sent to?[facility]-[phone] 3. Do they need a 30 day or 90 day supply?

## 2015-10-21 DIAGNOSIS — R972 Elevated prostate specific antigen [PSA]: Secondary | ICD-10-CM | POA: Diagnosis not present

## 2015-10-21 DIAGNOSIS — N400 Enlarged prostate without lower urinary tract symptoms: Secondary | ICD-10-CM | POA: Diagnosis not present

## 2015-11-14 DIAGNOSIS — E119 Type 2 diabetes mellitus without complications: Secondary | ICD-10-CM | POA: Diagnosis not present

## 2015-11-14 DIAGNOSIS — H1851 Endothelial corneal dystrophy: Secondary | ICD-10-CM | POA: Diagnosis not present

## 2015-12-02 ENCOUNTER — Encounter: Payer: Self-pay | Admitting: *Deleted

## 2015-12-02 DIAGNOSIS — Z006 Encounter for examination for normal comparison and control in clinical research program: Secondary | ICD-10-CM

## 2015-12-02 NOTE — Progress Notes (Signed)
TWILIGHT Research  Study month 9 office visit completed. Patient states he has been compliant with medication. He denies any bleeding or other adverse events. ASA/PLACEBO and brilinta dispensed. Questions encouraged and answered.

## 2015-12-12 ENCOUNTER — Telehealth: Payer: Self-pay | Admitting: Cardiology

## 2015-12-12 MED ORDER — EVOLOCUMAB 140 MG/ML ~~LOC~~ SOAJ: 140.0000 mg | SUBCUTANEOUS | 12 refills | Status: DC

## 2015-12-12 NOTE — Telephone Encounter (Signed)
Spoke with wife, will have 2 sample syringes for patient to pick up at their convenience.

## 2015-12-12 NOTE — Telephone Encounter (Signed)
New Message   *STAT* If patient is at the pharmacy, call can be transferred to refill team.   1. Which medications need to be refilled? (please list name of each medication and dose if known) Repatha 140mg   2. Which pharmacy/location (including street and city if local pharmacy) is medication to be sent to? [facility]   3. Do they need a 30 day or 90 day supply? Per pt wife two shots

## 2015-12-12 NOTE — Telephone Encounter (Signed)
Forward to lipid clinic- pharmacist

## 2015-12-26 DIAGNOSIS — H1851 Endothelial corneal dystrophy: Secondary | ICD-10-CM | POA: Diagnosis not present

## 2015-12-26 DIAGNOSIS — H0289 Other specified disorders of eyelid: Secondary | ICD-10-CM | POA: Diagnosis not present

## 2016-01-03 DIAGNOSIS — Z23 Encounter for immunization: Secondary | ICD-10-CM | POA: Diagnosis not present

## 2016-01-24 ENCOUNTER — Ambulatory Visit (INDEPENDENT_AMBULATORY_CARE_PROVIDER_SITE_OTHER): Payer: Medicare HMO | Admitting: Family Medicine

## 2016-01-24 ENCOUNTER — Encounter: Payer: Self-pay | Admitting: Family Medicine

## 2016-01-24 VITALS — BP 124/80 | HR 60 | Resp 12 | Ht 67.0 in | Wt 187.0 lb

## 2016-01-24 DIAGNOSIS — K1370 Unspecified lesions of oral mucosa: Secondary | ICD-10-CM | POA: Diagnosis not present

## 2016-01-24 MED ORDER — TRIAMCINOLONE ACETONIDE 0.1 % MT PSTE: 1.0000 "application " | PASTE | Freq: Two times a day (BID) | OROMUCOSAL | 0 refills | Status: AC

## 2016-01-24 NOTE — Progress Notes (Signed)
Pre visit review using our clinic review tool, if applicable. No additional management support is needed unless otherwise documented below in the visit note. 

## 2016-01-24 NOTE — Patient Instructions (Signed)
Joe Murray I have seen you today for an acute visit.  1. Oral mucosal lesion  ? Mucocele.  If not resolved in 2-3 weeks dental appointment should be scheduled.  Tobacco increases risk of oral cancer.   - triamcinolone (KENALOG) 0.1 % paste; Use as directed 1 application in the mouth or throat 2 (two) times daily.  Dispense: 15 g; Refill: 0    In general please monitor for signs of worsening symptoms and seek immediate medical attention if any concerning/warning symptom as we discussed.   If symptoms are not resolved in a few days/weeks you should schedule a follow up appointment with your doctor, before if needed.  Please be sure you have an appointment already scheduled with your PCP before you leave today.     Mucocele of the Mouth Introduction A mucocele is a growth or bump (cyst) that is filled with mucus. A mucocele can form on various parts of the mouth, including the gums, the tongue, and the inside of the cheeks. A common spot is inside the lower lip. Mucoceles are not dangerous, and they are usually not painful. A mucocele can be uncomfortable if it is very large or if it is located under the tongue. Small mucoceles often clear up on their own. Treatment may not be needed. Mucoceles that are bigger or that keep coming back might need to be removed. What are the causes? Mucoceles form when the salivary ducts in the mouth are damaged and leak saliva. These ducts carry saliva from the salivary glands to the surface of the mouth. A mucocele can develop because of:  An injury to your mouth.  Sucking or biting on your lips or tongue.  A blocked salivary duct. This is sometimes caused by swelling.  Piercing of the tongue or lip for jewelry. In some cases, the cause may not be known. What are the signs or symptoms? Symptoms of this condition include a smooth, painless bumpinside your mouth. The bump may:  Show up all of a sudden.  Have thin walls and a  bluish color.  Change in size. Most are smaller than  inch. Mucoceles under the tongue are called ranulas. These may be bigger and may push your tongue up and back. In some cases, this can make it hard to talk, swallow, or breathe. How is this diagnosed?  This condition is usually diagnosed with a physical exam. Your health care provider will often be able to tell if you have a mucocele by looking at it and feeling it.    How is this treated? Treatment may depend on the size of the mucocele:  For a small mucocele, treatment is usually not needed. It will drain on its own and go away.  For larger mucoceles and ranulas, surgery may be needed. This may be done if the mucocele does not go away or if it keeps coming back. The entire mucocele may be taken out. In some cases, the salivary gland may also be removed.   Follow these instructions at home:  Take over-the-counter and prescription medicines only as told by your health care provider.  Do not try to drain a mucocele on your own. Do not poke a hole in it.  Do not use any tobacco products, such as cigarettes, chewing tobacco, and e-cigarettes. If you need help quitting, ask your health care provider.  Do not suck or bite on your lips or tongue.  If your mucocele was removed, avoid hard or spicy foods and  foods that have high acidity while your mouth is healing.  Keep all follow-up visits as told by your health care provider. This is important.   Contact a health care provider if:  You have a lump or cyst inside your mouth that does not go away.  You have a fever. Get help right away if:  You have a lump or cyst in your mouth that:  Becomes painful.  Gets large very fast.  You have a lump or cyst in your mouth that makes it hard to:  Swallow.  Talk.  Breathe. This information is not intended to replace advice given to you by your health care provider. Make sure you discuss any questions you have with your health  care provider. Document Released: 03/08/2015 Document Revised: 07/18/2015 Document Reviewed: 05/07/2014  2017 Elsevier

## 2016-01-24 NOTE — Progress Notes (Signed)
HPI:  ACUTE VISIT:  Chief Complaint  Patient presents with  . lump under tongue    [name] is a 75 y.o. male, who is here today alone complaining of a week of "lump" under tongue, it seems like is getting bigger. "Not much" pain, he thinks it might be related with a loose tooth he has had for 2-3 years. He is not sure about alleviating factors, pain is exacerbated by chewing foods on left side of his mouth. If he chews on right side he has no pain.   He denies gun pain or bleeding. He has not noted dysphagia or odynophagia.  He denies any injury, he smokes cigars, he denies chewing tobacco. He has not tried anything OTC for this problem.  He has not seen a dentist in years.   Review of Systems  Constitutional: Negative for appetite change, chills, fatigue, fever and unexpected weight change.  HENT: Positive for dental problem. Negative for mouth sores, nosebleeds, sore throat, trouble swallowing and voice change.   Respiratory: Negative for cough, shortness of breath and wheezing.   Musculoskeletal: Negative for myalgias and neck pain.  Skin: Negative for rash.  Neurological: Negative for speech difficulty, weakness and headaches.  Hematological: Negative for adenopathy. Does not bruise/bleed easily.  Psychiatric/Behavioral: Negative for confusion.      Current Outpatient Prescriptions on File Prior to Visit  Medication Sig Dispense Refill  . AMBULATORY NON FORMULARY MEDICATION Take 90 mg by mouth 2 (two) times daily. Medication Name: BRILINTA 90 mg BID (TWILIGHT Research study provided do not fill)    . AMBULATORY NON FORMULARY MEDICATION Take 81 mg by mouth daily. Medication Name: ASA 81 mg daily or PLACEBO (TWILIGHT Research Study)    . amLODipine (NORVASC) 2.5 MG tablet Take 1 tablet (2.5 mg total) by mouth daily. 90 tablet 3  . Cholecalciferol (VITAMIN D3) 5000 UNITS CAPS Take 5,000 Units by mouth daily. 30 capsule 0  . Cinnamon 500 MG capsule Take  2,000 mg by mouth daily.     Marland Kitchen donepezil (ARICEPT) 5 MG tablet TAKE ONE TABLET BY MOUTH AT BEDTIME 90 tablet 3  . Evolocumab (REPATHA SURECLICK) 944 MG/ML SOAJ Inject 140 mg into the skin every 14 (fourteen) days. 2 pen 12  . fish oil-omega-3 fatty acids 1000 MG capsule Take 2 g by mouth daily.      Marland Kitchen glipiZIDE (GLUCOTROL) 5 MG tablet TAKE ONE TABLET BY MOUTH TWICE DAILY BEFORE A MEAL 180 tablet 3  . glucose blood (ONE TOUCH ULTRA TEST) test strip Test once a day and diagnosis code is E 11.9 100 each 0  . Lancets (BD LANCET ULTRAFINE 30G) MISC Test once per day and diagnosis code is E 11.9 100 each 0  . MAGNESIUM CHLORIDE PO Take 1 tablet by mouth daily.      . meclizine (ANTIVERT) 25 MG tablet TAKE ONE TABLET BY MOUTH THREE TIMES DAILY AS NEEDED FOR DIZZINESS 90 tablet 0  . nitroGLYCERIN (NITROSTAT) 0.4 MG SL tablet Place 0.4 mg under the tongue every 5 (five) minutes as needed for chest pain. Reported on 08/16/2015    . omeprazole (PRILOSEC) 20 MG capsule TAKE ONE CAPSULE BY MOUTH ONCE DAILY 90 capsule 1  . potassium gluconate 595 MG TABS tablet Take 595 mg by mouth.     No current facility-administered medications on file prior to visit.      Past Medical History:  Diagnosis Date  . CAD S/P percutaneous coronary angioplasty 10/'03; 3/'09;  7/*09; 1/'17    a. Dr. Ellyn Hack; '03 - Cypher DES 3.0 mm 33 mm proximal-mid LAD (details D1 with ostial 60%); 3/'09 dRCA 2.75 mm x 13 mm Cypher DES (2.8 mm); 7/'09 pRCA 3.0 mm x 18 m Cypher DES (3.25 mm);; (2009) 2D Echo - EF 55%, (November 2014) nonischemic Myoview;; b. Promus DES to RCA and mid LAD 02/26/2015  . Chronotropic incompetence 12/2011   For Exertional Dyspnea:  MET Test - EKG-sinus bradycardia, no arrhythmia/ectopy;; treadmill stress test portion of Myoview: Able to achieve 85% maximum heart rate.  . Diabetes mellitus type 2 in obese (HCC)    CAD  . Diverticulosis   . Dyslipidemia, goal LDL below 70    on [employer] study medicaion.  . ED  (erectile dysfunction)   . GERD (gastroesophageal reflux disease)   . Hemorrhoids 07/2002   Internal and External  . Insomnia   . Non-STEMI (non-ST elevated myocardial infarction) ([place]) 11/2001   a. Proximal LAD tandem lesions -- long DES stent covering both;; b. Jan 2017: mRCA PCI, [place] PCI  . Persistent sinus bradycardia   . Personal history of colonic adenomas 09/06/2012  . Psoriasis    Resting HR in 54s  . Spasm of esophagus   . SVT (supraventricular tachycardia) ([place]) 2003  . Vertigo, benign positional    Allergies  Allergen Reactions  . Beta Adrenergic Blockers Other (See Comments)    chronotropic incompetence  . Statins     REACTION: myalgias  . Sulfonamide Derivatives     unknown    Social History   Social History  . Marital status: Married    Spouse name: N/A  . Number of children: 4  . Years of education: N/A   Occupational History  . retired    Social History Main Topics  . Smoking status: Current Every Day Smoker    Types: Cigars    Start date: 07/06/1952  . Smokeless tobacco: Never Used     Comment: 1-2 per day; he says "I really don't inhale"  . Alcohol use 0.0 oz/week     Comment: occ  . Drug use: No  . Sexual activity: No   Other Topics Concern  . None   Social History Narrative   He is a married, father of 10, grandfather 3.   He still smokes 2-3 cigars per day. States that he "does not really inhale ". He is not really all that it's including, stating that he wants to have his 1 remaining vice.   Exercises only on occasion.   He does various landscaping jobs including cutting wood, and clearing brush.    Vitals:   01/24/16 1518  BP: 124/80  Pulse: 60  Resp: 12   Body mass index is 29.29 kg/m.   Physical Exam  Constitutional: He appears well-developed and well-nourished. He does not appear ill. No distress.  HENT:  Head: Atraumatic.  Mouth/Throat: Uvula is midline and oropharynx is clear and moist. Abnormal dentition.  Floor of  mouth right beside opening of submandibular gland duct on left side there is a small ,soft papular lesion, not very noticeable upon inspection, not ulcerated, no erythema, no indurated, and not tender. It is about 4 mm. No gum edema or erythema. Poor dentition.  Eyes: Conjunctivae and EOM are normal.  Respiratory: Effort normal and breath sounds normal. No respiratory distress.  Lymphadenopathy:       Head (right side): No submental and no submandibular adenopathy present.       Head (left  side): No submental and no submandibular adenopathy present.    He has no cervical adenopathy.  Neurological: He is alert. He has normal strength.  Stable gait with no assistance.  Skin: Skin is warm. No rash noted. No erythema.  Psychiatric: He has a normal mood and affect. His speech is normal.  Well groomed, good eye contact.      ASSESSMENT AND PLAN:     Carlitos was seen today for lump under tongue.  Diagnoses and all orders for this visit:  Oral mucosal lesion -     triamcinolone (KENALOG) 0.1 % paste; Use as directed 1 application in the mouth or throat 2 (two) times daily.   Oral lesions was hard to identify initially, I was able to see it when he pointed to me. Lesion does not seem suspicious for malignancy but given his history of tobacco use it needs to be considered. ? Mucocele. I strongly recommend regular dental care, we discussed possible complications of poor dental hygiene. For now he agrees with monitoring, topical steroid paste may help. Other witting instructions given. Instructed to follow up with dentist or PCP if lesion has not resolved in 2-3 weeks, even though mucoceles and other benign lesions could persist for longer periods of time.  I repeat instructions/plan 3 times and he did repeat back to me.  Strongly encouraged smoking cessation.      Return if symptoms worsen or fail to improve, for Dentist or PCP please.     -[name] was advised to  return or notify a doctor immediately if symptoms worsen or persist or new concerns arise, he voices understanding.       Betty G. Martinique, MD  [facility], [place]. [place] office.

## 2016-02-25 ENCOUNTER — Other Ambulatory Visit: Payer: Self-pay | Admitting: Family Medicine

## 2016-03-02 ENCOUNTER — Telehealth: Payer: Self-pay | Admitting: Cardiology

## 2016-03-02 MED ORDER — EVOLOCUMAB 140 MG/ML ~~LOC~~ SOAJ: 140.0000 mg | SUBCUTANEOUS | 0 refills | Status: DC

## 2016-03-02 NOTE — Telephone Encounter (Signed)
New message    *STAT* If patient is at the pharmacy, call can be transferred to refill team.   1. Which medications need to be refilled? (please list name of each medication and dose if known)   Evolocumab (REPATHA SURECLICK) [id number] MG/ML SOAJ   2. Which pharmacy/location (including street and city if local pharmacy) is medication to be sent to? Picks up from [facility]  3. Do they need a 30 day or 90 day supply? 30 day  Per pt wife picks prescription up from office. Free every other month.

## 2016-03-03 ENCOUNTER — Other Ambulatory Visit: Payer: Self-pay | Admitting: Family Medicine

## 2016-03-03 DIAGNOSIS — R69 Illness, unspecified: Secondary | ICD-10-CM | POA: Diagnosis not present

## 2016-03-17 ENCOUNTER — Other Ambulatory Visit: Payer: Self-pay | Admitting: Physician Assistant

## 2016-03-17 NOTE — Telephone Encounter (Signed)
REFILL 

## 2016-03-17 NOTE — Telephone Encounter (Signed)
Please review for refill. Thanks!  

## 2016-04-15 DIAGNOSIS — R972 Elevated prostate specific antigen [PSA]: Secondary | ICD-10-CM | POA: Diagnosis not present

## 2016-04-22 DIAGNOSIS — R972 Elevated prostate specific antigen [PSA]: Secondary | ICD-10-CM | POA: Diagnosis not present

## 2016-04-22 DIAGNOSIS — R3129 Other microscopic hematuria: Secondary | ICD-10-CM | POA: Diagnosis not present

## 2016-05-05 DIAGNOSIS — R3129 Other microscopic hematuria: Secondary | ICD-10-CM | POA: Diagnosis not present

## 2016-05-12 ENCOUNTER — Telehealth: Payer: Self-pay | Admitting: Cardiology

## 2016-05-12 MED ORDER — EVOLOCUMAB 140 MG/ML ~~LOC~~ SOAJ: 140.0000 mg | SUBCUTANEOUS | 0 refills | Status: DC

## 2016-05-12 NOTE — Telephone Encounter (Signed)
New message      Please call pt and let him know when he can pick up the rapatha injections

## 2016-05-12 NOTE — Telephone Encounter (Signed)
Will have 1 month samples for Joe Murray.  LMOM for family to pick up at their convenience

## 2016-05-25 ENCOUNTER — Ambulatory Visit: Payer: Medicare HMO | Admitting: Cardiology

## 2016-05-28 ENCOUNTER — Ambulatory Visit (INDEPENDENT_AMBULATORY_CARE_PROVIDER_SITE_OTHER): Payer: Medicare HMO | Admitting: Cardiology

## 2016-05-28 ENCOUNTER — Encounter: Payer: Self-pay | Admitting: Cardiology

## 2016-05-28 VITALS — BP 130/70 | HR 48 | Ht 69.0 in | Wt 191.2 lb

## 2016-05-28 DIAGNOSIS — I2510 Atherosclerotic heart disease of native coronary artery without angina pectoris: Secondary | ICD-10-CM

## 2016-05-28 DIAGNOSIS — Z9861 Coronary angioplasty status: Secondary | ICD-10-CM | POA: Diagnosis not present

## 2016-05-28 DIAGNOSIS — I10 Essential (primary) hypertension: Secondary | ICD-10-CM

## 2016-05-28 DIAGNOSIS — I4589 Other specified conduction disorders: Secondary | ICD-10-CM | POA: Diagnosis not present

## 2016-05-28 DIAGNOSIS — I25110 Atherosclerotic heart disease of native coronary artery with unstable angina pectoris: Secondary | ICD-10-CM | POA: Diagnosis not present

## 2016-05-28 DIAGNOSIS — E785 Hyperlipidemia, unspecified: Secondary | ICD-10-CM

## 2016-05-28 DIAGNOSIS — E669 Obesity, unspecified: Secondary | ICD-10-CM

## 2016-05-28 DIAGNOSIS — I214 Non-ST elevation (NSTEMI) myocardial infarction: Secondary | ICD-10-CM | POA: Diagnosis not present

## 2016-05-28 MED ORDER — TICAGRELOR 90 MG PO TABS: 90.0000 mg | ORAL_TABLET | Freq: Two times a day (BID) | ORAL | 3 refills | Status: DC

## 2016-05-28 NOTE — Progress Notes (Signed)
PCP: Alysia Penna, MD  Clinic Note: Chief Complaint  Patient presents with  . Follow-up    Pt. has no complaints  . Coronary Artery Disease    HPI: Joe Murray is a 76 y.o. male with a PMH below who presents today for six-month follow-up from CAD-PCI. non-STEMI-PCI f/u..-- s/p DES to mid RCA and DES to mid LAD on 02/26/2015--> currently enrolled in Twilight study that is about to stop --  Also Seen by Tommy Medal in Lipid clinic - now currently on PCSK-9 Inhibitor evaluation.   Joe Murray was last seen on July 2017. He was doing very well with no recurrent anginal symptoms. More limited by knee pain than anything else. If he walked and easy PA-C did well, but if he were to speed up, he would get dyspneic. No angina.  Recent Hospitalizations: None  Studies Reviewed: None  Interval History: Joe Murray returns today doing quite well with no active symptoms. He is continually trying to lose weight, cutting back, she eats and is trying to do more exercise. He says that he cut back on his diabetes medicine and is noted that his dizziness is improved. He has not had any recurrent anginal symptoms with rest or exertion. He is trying to do more exercise albeit still limited by arthritis pains. No PND, orthopnea or edema.   No palpitations, lightheadedness, dizziness, weakness or syncope/near syncope. No TIA/amaurosis fugax symptoms. No melena, hematochezia, hematuria (has been told he has microscopic hematuria), or epstaxis. No claudication.  He still smokes about 2-2 1/2 cigars a day.  ROS: A comprehensive was performed. Review of Systems  Constitutional: Positive for malaise/fatigue (Energy level is improving.).  HENT: Positive for hearing loss.   Musculoskeletal: Positive for back pain and joint pain (Knees).  Neurological: Positive for dizziness (Notably improved since cutting back on his diabetes medication).  Psychiatric/Behavioral: Negative for depression and memory  loss. The patient is not nervous/anxious and does not have insomnia.   All other systems reviewed and are negative.   Past Medical History:  Diagnosis Date  . CAD S/P percutaneous coronary angioplasty 10/'03; 3/'09; 7/*09; 1/'17    a. Dr. Ellyn Hack; '03 - Cypher DES 3.0 mm 33 mm proximal-mid LAD (details D1 with ostial 60%); 3/'09 dRCA 2.75 mm x 13 mm Cypher DES (2.8 mm); 7/'09 pRCA 3.0 mm x 18 m Cypher DES (3.25 mm);; (2009) 2D Echo - EF 55%, (November 2014) nonischemic Myoview;; b. Promus DES to RCA and mid LAD 02/26/2015  . Chronotropic incompetence 12/2011   For Exertional Dyspnea:  MET Test - EKG-sinus bradycardia, no arrhythmia/ectopy;; treadmill stress test portion of Myoview: Able to achieve 85% maximum heart rate.  . Diabetes mellitus type 2 in obese (HCC)    CAD  . Diverticulosis   . Dyslipidemia, goal LDL below 70    on PharmQuest study medicaion.  . ED (erectile dysfunction)   . GERD (gastroesophageal reflux disease)   . Hemorrhoids 07/2002   Internal and External  . Insomnia   . Non-STEMI (non-ST elevated myocardial infarction) ([place]) 11/2001   a. Proximal LAD tandem lesions -- long DES stent covering both;; b. Jan 2017: mRCA PCI, [place] PCI  . Persistent sinus bradycardia   . Personal history of colonic adenomas 09/06/2012  . Psoriasis    Resting HR in 54s  . Spasm of esophagus   . SVT (supraventricular tachycardia) ([place]) 2003  . Vertigo, benign positional     Past Surgical History:  Procedure Laterality Date  .  CARDIAC CATHETERIZATION N/A 02/26/2015   Procedure: Left Heart Cath and Coronary Angiography;  Surgeon: Leonie Man, MD: mRCA 99% --> PCI. mLAD 75%-> FFR Guided PCI, Mod ISR in pLAD & pRCA  . CARDIAC CATHETERIZATION N/A 02/26/2015   Procedure: Coronary Stent Intervention;  Surgeon: Leonie Man, MD;  Location: [facility]: mRCA Promus Premier DES 3.0 x 12 (3.5), mLAD Promus Premier DES 2.75 x 20 (3.0)  . CARDIAC CATHETERIZATION N/A 02/26/2015   Procedure:  Intravascular Pressure Wire/FFR Study;  Surgeon: Leonie Man, MD;  Location: [facility];  Service: Cardiovascular: LAD 75% - FFR + -> PCI  . CATARACT EXTRACTION  [id number]  . COLONOSCOPY  08-31-12   per Dr. Carlean Purl, adenomatous polyps, repeat in 3 years   . CORONARY ANGIOPLASTY WITH STENT PLACEMENT  LAD - 2003, RCA 3 & 7/ '09   Cypher 3.0 x 32 mid LAD; Cyper 2.75 x 13 - distal RCA, 3.0 x 18 Prox RCA  . ESOPHAGOGASTRODUODENOSCOPY  08-11-02   esophageal dilation per Dr. Carlean Purl  . HERNIA REPAIR     right inguinal   . NM MYOVIEW LTD  Nov 2014   ~8 METS; EF 60%, no ischemia or infarction  . PERCUTANEOUS CORONARY STENT INTERVENTION (PCI-S)  11/23/2001   NSTEMI: Prox-Mid LAD tandem ~80% lesions on either side of D1 (with 80% lesion) -- Cypher DES 3.0 mm x 33 m (covering both lesions)   . PERCUTANEOUS CORONARY STENT INTERVENTION (PCI-S)  04/27/2007   Unstable Angina: Distal RCA 95%: Cypher 2.75x13  (2.8 mm); residual focal ~60%ISR in LAD stent, 60% D1 ostial (no PTCA on LAD due to no ischemia on ST)  . PERCUTANEOUS CORONARY STENT INTERVENTION (PCI-S)  09/21/2007   Bradycardia & Unstable Angina: Prox RCA 70% - PCI Cypher DES 3.0 mm x 18 mm  (3.25 mm); ostial 60-70% jailed D1. LAD & distal RCA stents patent  . SHOULDER SURGERY     left rotator cuff, Dr. Gladstone Lighter  . TRANSTHORACIC ECHOCARDIOGRAM  March 2009   Normal LV size and function, EF 55%. Mild MR; mild RV dilation.  . TRANSTHORACIC ECHOCARDIOGRAM  02/2015   EF 60-65%. Moderate concentric LVH. Normal function with normal regional wall motion. GR 1 DD  . WRIST FRACTURE SURGERY     right    Current Meds  Medication Sig  . AMBULATORY NON FORMULARY MEDICATION Take 90 mg by mouth 2 (two) times daily. Medication Name: BRILINTA 90 mg BID (TWILIGHT Research study provided do not fill)  . AMBULATORY NON FORMULARY MEDICATION Take 81 mg by mouth daily. Medication Name: ASA 81 mg daily or PLACEBO (TWILIGHT Research Study)  . amLODipine (NORVASC)  2.5 MG tablet TAKE ONE TABLET BY MOUTH ONCE DAILY  . Cholecalciferol (VITAMIN D3) 5000 UNITS CAPS Take 5,000 Units by mouth daily.  . Cinnamon 500 MG capsule Take 2,000 mg by mouth daily.   . Evolocumab (REPATHA SURECLICK) 917 MG/ML SOAJ Inject 140 mg into the skin every 14 (fourteen) days.  . fish oil-omega-3 fatty acids 1000 MG capsule Take 2 g by mouth daily.    Marland Kitchen glipiZIDE (GLUCOTROL) 5 MG tablet TAKE ONE TABLET BY MOUTH TWICE DAILY BEFORE MEAL(S)  . Lancets (BD LANCET ULTRAFINE 30G) MISC USE ONE  TO CHECK GLUCOSE ONCE DAILY  . MAGNESIUM CHLORIDE PO Take 1 tablet by mouth daily.    . meclizine (ANTIVERT) 25 MG tablet TAKE ONE TABLET BY MOUTH THREE TIMES DAILY AS NEEDED FOR DIZZINESS  . nitroGLYCERIN (NITROSTAT) 0.4 MG  SL tablet Place 0.4 mg under the tongue every 5 (five) minutes as needed for chest pain. Reported on 08/16/2015  . ONE TOUCH ULTRA TEST test strip USE ONE STRIP TO CHECK GLUCOSE ONCE DAILY  . potassium gluconate 595 MG TABS tablet Take 595 mg by mouth.    Allergies  Allergen Reactions  . Beta Adrenergic Blockers Other (See Comments)    chronotropic incompetence  . Sulfonamide Derivatives     unknown  . Statins Other (See Comments)    REACTION: myalgias    Social History   Social History  . Marital status: Married    Spouse name: N/A  . Number of children: 4  . Years of education: N/A   Occupational History  . retired    Social History Main Topics  . Smoking status: Current Every Day Smoker    Types: Cigars    Start date: 07/06/1952  . Smokeless tobacco: Never Used     Comment: 1-2 per day; he says "I really don't inhale"  . Alcohol use 0.0 oz/week     Comment: occ  . Drug use: No  . Sexual activity: No   Other Topics Concern  . None   Social History Narrative   He is a married, father of 37, grandfather 3.   He still smokes 2-3 cigars per day. States that he "does not really inhale ". He is not really all that it's including, stating that he wants to  have his 1 remaining vice.   Exercises only on occasion.   He does various landscaping jobs including cutting wood, and clearing brush.    family history includes Dementia in his father; Diabetes in his mother; Hearing loss in his father; Heart attack in his brother; Heart attack (age of onset: 39) in his father.  Wt Readings from Last 3 Encounters:  05/28/16 191 lb 3.2 oz (86.7 kg)  01/24/16 187 lb (84.8 kg)  09/19/15 188 lb (85.3 kg)  -- lost weight - diet adjustment (lower portions) & increased activity.  PHYSICAL EXAM BP 130/70 (BP Location: Left Arm, Patient Position: Sitting, Cuff Size: Normal)   Pulse (!) 48   Ht '5\' 9"'  (1.753 m)   Wt 191 lb 3.2 oz (86.7 kg)   BMI 28.24 kg/m  General appearance: A&Ox3; NAD. Pleasant mood and affect albeit somewhat blunted. Borderline obese. Well groomed Neck: no adenopathy, no carotid bruit, no JVD,  Lungs: CTA B., normal percussion bilaterally and Nonlabored Heart: Regular rhythm with a bradycardia rate; S1&S2 normal, no murmur, click, rub or gallop and normal apical impulse Abdomen: soft, NT/ND; bowel sounds normal; no masses, no organomegaly and Mild truncal obesity  Extremities: extremities normal, atraumatic, no cyanosis or edema and varicose veins noted; Pulses: 2+ and symmetric Neurologic: Grossly normal    Adult ECG Report  Rate: 48 ;  Rhythm: sinus bradycardia and LAFB. Normal invtervals & voltage.;   Narrative Interpretation: stable    Other studies Reviewed: Additional studies/ records that were reviewed today include:  Recent Labs:    Lab Results  Component Value Date   CHOL 135 08/09/2015   HDL 48.40 08/09/2015   LDLCALC 59 08/09/2015   LDLDIRECT 85.5 06/27/2012   TRIG 136.0 08/09/2015   CHOLHDL 3 08/09/2015    ASSESSMENT / PLAN: Problem List Items Addressed This Visit    CAD S/P percutaneous coronary angioplasty - LADx2, & RCA x 3 (Chronic)    Plan for now would be to continue Brilinta if financially feasible. If  not we'll  switch to Plavix. No aspirin.      Chronotropic incompetence (Chronic)    Baseline bradycardia, unable to use beta blockers. Continue to monitor for signs of symptomatic bradycardia.      Coronary artery disease involving native coronary artery of native heart with unstable angina pectoris (HCC) - Primary (Chronic)    Multiple episodes of recurrent anginal symptoms and non-ST elevation MIs. However no further episodes since his most recent PCI. Unable to tolerate beta blocker due to bradycardia. Not on ACE inhibitor because of hypotension. He is on calcium channel blocker for antianginal effect and blood pressure control. Did not tolerate statins secondary to memory issues. He has been on Repatha.  With recent DES stent now beyond 1 year out, he is no longer in the [place] trial window. I would like for him to stay on Alimta in the extent of stent work that he has in his coronaries. We can do this without aspirin. I given him prescription for Brilinta along with some samples. Provided that the co-pay for Brilinta will not push him over the edge in combination with the co-pay for Repatha, I would like to stay on Brilinta. If not we can switch back to Plavix.      Dyslipidemia, goal LDL below 70 (Chronic)    Previously on a PharmQuest trial medication - now on Repatha with excellent control.  Being followed by our [place] team.      Essential hypertension (Chronic)    Relatively stable blood pressures now. He is on low-dose amlodipine and unable to use beta blocker because of his resting bradycardia. He has had a history of orthostatic dizziness which led to stopping his ACE inhibitor.      Relevant Orders   EKG 12-Lead   NSTEMI (non-ST elevated myocardial infarction) (HCC) (Chronic)    Thankfully, no further adverse events. With no angina or heart failure symptoms. He had preserved EF on echo with no significant regional wall motion abnormalities.  Remains on stable medical  regimen.      Relevant Orders   EKG 12-Lead   Obesity (BMI 30-39.9) (Chronic)    He was doing very well with weight loss, has taken a step back recently because he was dealing with a viral illness. He is hoping to get into the exercise routine and lose weight back again.         Current medicines are reviewed at length with the patient today. (+/- concerns) about to complete Twilight Trial - needs Rx for Brilinta The following changes have been made: n/a  Patient Instructions  Medication Instructions: continue Brilinta. We have provided Rx and samples today.   Labwork: none ordered - have your primary care physician check a cholesterol level with your next labs.   Testing/Procedures: none   Follow-Up: 6 months   If you need a refill on your cardiac medications before your next appointment, please call your pharmacy.    Studies Ordered:   Orders Placed This Encounter  Procedures  . EKG 12-Lead      Glenetta Hew, M.D., M.S. Interventional Cardiologist   Pager # [phone] Phone # [phone] [address] [place], [place] [address]

## 2016-05-28 NOTE — Patient Instructions (Signed)
Medication Instructions: continue Brilinta. We have provided Rx and samples today.   Labwork: none ordered - have your primary care physician check a cholesterol level with your next labs.   Testing/Procedures: none   Follow-Up: 6 months   If you need a refill on your cardiac medications before your next appointment, please call your pharmacy.

## 2016-05-30 ENCOUNTER — Encounter: Payer: Self-pay | Admitting: Cardiology

## 2016-05-30 NOTE — Assessment & Plan Note (Signed)
He was doing very well with weight loss, has taken a step back recently because he was dealing with a viral illness. He is hoping to get into the exercise routine and lose weight back again.

## 2016-05-30 NOTE — Assessment & Plan Note (Signed)
Thankfully, no further adverse events. With no angina or heart failure symptoms. He had preserved EF on echo with no significant regional wall motion abnormalities.  Remains on stable medical regimen.

## 2016-05-30 NOTE — Assessment & Plan Note (Signed)
Multiple episodes of recurrent anginal symptoms and non-ST elevation MIs. However no further episodes since his most recent PCI. Unable to tolerate beta blocker due to bradycardia. Not on ACE inhibitor because of hypotension. He is on calcium channel blocker for antianginal effect and blood pressure control. Did not tolerate statins secondary to memory issues. He has been on Repatha.  With recent DES stent now beyond 1 year out, he is no longer in the [place] trial window. I would like for him to stay on Alimta in the extent of stent work that he has in his coronaries. We can do this without aspirin. I given him prescription for Brilinta along with some samples. Provided that the co-pay for Brilinta will not push him over the edge in combination with the co-pay for Repatha, I would like to stay on Brilinta. If not we can switch back to Plavix.

## 2016-05-30 NOTE — Assessment & Plan Note (Signed)
Relatively stable blood pressures now. He is on low-dose amlodipine and unable to use beta blocker because of his resting bradycardia. He has had a history of orthostatic dizziness which led to stopping his ACE inhibitor.

## 2016-05-30 NOTE — Assessment & Plan Note (Signed)
Baseline bradycardia, unable to use beta blockers. Continue to monitor for signs of symptomatic bradycardia.

## 2016-05-30 NOTE — Assessment & Plan Note (Signed)
Plan for now would be to continue Brilinta if financially feasible. If not we'll switch to Plavix. No aspirin.

## 2016-05-30 NOTE — Assessment & Plan Note (Signed)
Previously on a PharmQuest trial medication - now on Repatha with excellent control.  Being followed by our [place] [facility] team.

## 2016-06-02 ENCOUNTER — Encounter: Payer: Self-pay | Admitting: *Deleted

## 2016-06-02 ENCOUNTER — Other Ambulatory Visit: Payer: Self-pay | Admitting: *Deleted

## 2016-06-02 DIAGNOSIS — Z006 Encounter for examination for normal comparison and control in clinical research program: Secondary | ICD-10-CM

## 2016-06-02 NOTE — Progress Notes (Addendum)
TWILIGHT Research study month 15 visit completed. Patient states he had microscopic hematuria noted at a urology appointment, denies other adverse events. He has been 100% compliant with ASA/Placebo and 96% compliant with Brilinta noted after pill count. He has been prescribed Brilinta 90 mg BID as his standard of care (he has samples and script). He has 1 last research follow up due before 11/JUL/2018. Questions encouraged and answered.

## 2016-06-03 ENCOUNTER — Other Ambulatory Visit: Payer: Self-pay | Admitting: Dermatology

## 2016-06-03 DIAGNOSIS — L570 Actinic keratosis: Secondary | ICD-10-CM | POA: Diagnosis not present

## 2016-06-03 DIAGNOSIS — D492 Neoplasm of unspecified behavior of bone, soft tissue, and skin: Secondary | ICD-10-CM | POA: Diagnosis not present

## 2016-06-03 DIAGNOSIS — L820 Inflamed seborrheic keratosis: Secondary | ICD-10-CM | POA: Diagnosis not present

## 2016-06-03 DIAGNOSIS — D0439 Carcinoma in situ of skin of other parts of face: Secondary | ICD-10-CM | POA: Diagnosis not present

## 2016-06-09 DIAGNOSIS — R972 Elevated prostate specific antigen [PSA]: Secondary | ICD-10-CM | POA: Diagnosis not present

## 2016-06-09 DIAGNOSIS — R3121 Asymptomatic microscopic hematuria: Secondary | ICD-10-CM | POA: Diagnosis not present

## 2016-06-22 ENCOUNTER — Other Ambulatory Visit: Payer: Self-pay | Admitting: Physician Assistant

## 2016-06-23 NOTE — Telephone Encounter (Signed)
Please review for refill. Thanks!  

## 2016-06-23 NOTE — Telephone Encounter (Signed)
REFILL 

## 2016-06-29 DIAGNOSIS — Z961 Presence of intraocular lens: Secondary | ICD-10-CM | POA: Diagnosis not present

## 2016-06-29 DIAGNOSIS — H1851 Endothelial corneal dystrophy: Secondary | ICD-10-CM | POA: Diagnosis not present

## 2016-07-02 DIAGNOSIS — D0439 Carcinoma in situ of skin of other parts of face: Secondary | ICD-10-CM | POA: Diagnosis not present

## 2016-07-14 ENCOUNTER — Telehealth: Payer: Self-pay | Admitting: Pharmacist Clinician (PhC)/ Clinical Pharmacy Specialist

## 2016-07-14 MED ORDER — EVOLOCUMAB 140 MG/ML ~~LOC~~ SOAJ: 140.0000 mg | SUBCUTANEOUS | 0 refills | Status: DC

## 2016-07-14 NOTE — Telephone Encounter (Signed)
Samples given,

## 2016-08-05 ENCOUNTER — Encounter: Payer: Self-pay | Admitting: *Deleted

## 2016-08-05 DIAGNOSIS — Z006 Encounter for examination for normal comparison and control in clinical research program: Secondary | ICD-10-CM

## 2016-08-05 NOTE — Progress Notes (Signed)
TWILIGHT Research study month 18 telephone call completed. Patient is still on Brilinta 90 mg BID without ASA. He denies any bleeding events. I thanked him for participating in the study and ask if he would like to participate in a cholesterol study if he met inclusion criteria. He is agreeable to this , Camila Li will call patient if he is a good candidate.

## 2016-08-14 ENCOUNTER — Telehealth: Payer: Self-pay | Admitting: Cardiology

## 2016-08-14 DIAGNOSIS — H1851 Endothelial corneal dystrophy: Secondary | ICD-10-CM

## 2016-08-14 DIAGNOSIS — E118 Type 2 diabetes mellitus with unspecified complications: Secondary | ICD-10-CM | POA: Diagnosis not present

## 2016-08-14 DIAGNOSIS — H18519 Endothelial corneal dystrophy, unspecified eye: Secondary | ICD-10-CM | POA: Insufficient documentation

## 2016-08-14 NOTE — Telephone Encounter (Signed)
EKG faxed. Low image quality on CareEverywhere copy. Robin aware to call back if further needs.

## 2016-08-14 NOTE — Telephone Encounter (Signed)
°  [facility] called over requesting a copy of an EKG from 05/2016 they were unable to read in care everywhere.

## 2016-08-19 ENCOUNTER — Encounter: Payer: Medicare HMO | Admitting: Family Medicine

## 2016-08-25 ENCOUNTER — Ambulatory Visit (INDEPENDENT_AMBULATORY_CARE_PROVIDER_SITE_OTHER): Payer: Medicare HMO | Admitting: Family Medicine

## 2016-08-25 ENCOUNTER — Encounter: Payer: Self-pay | Admitting: Family Medicine

## 2016-08-25 VITALS — BP 128/70 | HR 50 | Temp 98.0°F | Ht 69.0 in | Wt 191.0 lb

## 2016-08-25 DIAGNOSIS — I25110 Atherosclerotic heart disease of native coronary artery with unstable angina pectoris: Secondary | ICD-10-CM

## 2016-08-25 DIAGNOSIS — I10 Essential (primary) hypertension: Secondary | ICD-10-CM | POA: Diagnosis not present

## 2016-08-25 DIAGNOSIS — R42 Dizziness and giddiness: Secondary | ICD-10-CM | POA: Diagnosis not present

## 2016-08-25 DIAGNOSIS — E785 Hyperlipidemia, unspecified: Secondary | ICD-10-CM

## 2016-08-25 DIAGNOSIS — N401 Enlarged prostate with lower urinary tract symptoms: Secondary | ICD-10-CM

## 2016-08-25 DIAGNOSIS — N138 Other obstructive and reflux uropathy: Secondary | ICD-10-CM

## 2016-08-25 DIAGNOSIS — Z86010 Personal history of colonic polyps: Secondary | ICD-10-CM

## 2016-08-25 DIAGNOSIS — E559 Vitamin D deficiency, unspecified: Secondary | ICD-10-CM

## 2016-08-25 DIAGNOSIS — E118 Type 2 diabetes mellitus with unspecified complications: Secondary | ICD-10-CM

## 2016-08-25 DIAGNOSIS — K219 Gastro-esophageal reflux disease without esophagitis: Secondary | ICD-10-CM | POA: Diagnosis not present

## 2016-08-25 LAB — POC URINALSYSI DIPSTICK (AUTOMATED)
Bilirubin, UA: NEGATIVE
Glucose, UA: NEGATIVE
Ketones, UA: NEGATIVE
Leukocytes, UA: NEGATIVE
Nitrite, UA: NEGATIVE
Protein, UA: NEGATIVE
SPEC GRAV UA: 1.015 (ref 1.010–1.025)
UROBILINOGEN UA: 0.2 U/dL
pH, UA: 6.5 (ref 5.0–8.0)

## 2016-08-25 LAB — BASIC METABOLIC PANEL
BUN: 15 mg/dL (ref 6–23)
CALCIUM: 9.4 mg/dL (ref 8.4–10.5)
CO2: 26 mEq/L (ref 19–32)
CREATININE: 1.17 mg/dL (ref 0.40–1.50)
Chloride: 106 mEq/L (ref 96–112)
GFR: 64.32 mL/min (ref >60.00)
GLUCOSE: 144 mg/dL — AB (ref 70–99)
Potassium: 4.5 mEq/L (ref 3.5–5.1)
SODIUM: 139 meq/L (ref 135–145)

## 2016-08-25 LAB — LIPID PANEL
CHOLESTEROL: 140 mg/dL (ref 0–200)
HDL: 55 mg/dL (ref >39.00)
LDL CALC: 53 mg/dL (ref 0–99)
NonHDL: 85.47
TRIGLYCERIDES: 164 mg/dL — AB (ref 0.0–149.0)
Total CHOL/HDL Ratio: 3
VLDL: 32.8 mg/dL (ref 0.0–40.0)

## 2016-08-25 LAB — HEPATIC FUNCTION PANEL
ALT: 12 U/L (ref 0–53)
AST: 15 U/L (ref 0–37)
Albumin: 4.2 g/dL (ref 3.5–5.2)
Alkaline Phosphatase: 48 U/L (ref 39–117)
Bilirubin, Direct: 0.1 mg/dL (ref 0.0–0.3)
TOTAL PROTEIN: 6.6 g/dL (ref 6.0–8.3)
Total Bilirubin: 0.5 mg/dL (ref 0.2–1.2)

## 2016-08-25 LAB — CBC WITH DIFFERENTIAL/PLATELET
BASOS ABS: 0.1 10*3/uL (ref 0.0–0.1)
BASOS PCT: 0.7 % (ref 0.0–3.0)
EOS ABS: 0.2 10*3/uL (ref 0.0–0.7)
Eosinophils Relative: 3.3 % (ref 0.0–5.0)
HCT: 42.4 % (ref 39.0–52.0)
Hemoglobin: 14.4 g/dL (ref 13.0–17.0)
LYMPHS ABS: 1.9 10*3/uL (ref 0.7–4.0)
Lymphocytes Relative: 25.3 % (ref 12.0–46.0)
MCHC: 34 g/dL (ref 30.0–36.0)
MCV: 95.6 fl (ref 78.0–100.0)
MONO ABS: 0.6 10*3/uL (ref 0.1–1.0)
Monocytes Relative: 8.8 % (ref 3.0–12.0)
NEUTROS ABS: 4.5 10*3/uL (ref 1.4–7.7)
NEUTROS PCT: 61.9 % (ref 43.0–77.0)
PLATELETS: 188 10*3/uL (ref 150.0–400.0)
RBC: 4.43 Mil/uL (ref 4.22–5.81)
RDW: 12.7 % (ref 11.5–15.5)
WBC: 7.3 10*3/uL (ref 4.0–10.5)

## 2016-08-25 LAB — HEMOGLOBIN A1C: Hgb A1c MFr Bld: 6.3 % (ref 4.6–6.5)

## 2016-08-25 LAB — PSA: PSA: 1.47 ng/mL (ref 0.10–4.00)

## 2016-08-25 LAB — VITAMIN D 25 HYDROXY (VIT D DEFICIENCY, FRACTURES): VITD: 96.92 ng/mL (ref 30.00–100.00)

## 2016-08-25 LAB — TSH: TSH: 1.66 u[IU]/mL (ref 0.35–4.50)

## 2016-08-25 NOTE — Patient Instructions (Signed)
WE NOW OFFER   [facility] FAST TRACK!!!  SAME DAY Appointments for ACUTE CARE  Such as: Sprains, Injuries, cuts, abrasions, rashes, muscle pain, joint pain, back pain Colds, flu, sore throats, headache, allergies, cough, fever  Ear pain, sinus and eye infections Abdominal pain, nausea, vomiting, diarrhea, upset stomach Animal/insect bites  3 Easy Ways to Schedule: Walk-In Scheduling Call in scheduling Mychart Sign-up: [email or web address]         

## 2016-08-25 NOTE — Progress Notes (Signed)
   Subjective:    Patient ID: Joe Murray, male    DOB: 1941/02/04, 76 y.o.   MRN: [id number]  HPI Here to follow up on several issues. He feels fine except for right hip pain that flares up if he walks for long distances. He sees Dr. Ellyn Hack regularly for cardiology care. His glucoses at home are stable in the 110s and 120s. He was due for another colonoscopy last year but did not get one.    Review of Systems  Constitutional: Negative.   HENT: Negative.   Eyes: Negative.   Respiratory: Negative.   Cardiovascular: Negative.   Gastrointestinal: Negative.   Genitourinary: Negative.   Musculoskeletal: Positive for arthralgias. Negative for back pain, gait problem, joint swelling, myalgias, neck pain and neck stiffness.  Skin: Negative.   Neurological: Negative.   Psychiatric/Behavioral: Negative.        Objective:   Physical Exam  Constitutional: He is oriented to person, place, and time. He appears well-developed and well-nourished. No distress.  HENT:  Head: Normocephalic and atraumatic.  Right Ear: External ear normal.  Left Ear: External ear normal.  Nose: Nose normal.  Mouth/Throat: Oropharynx is clear and moist. No oropharyngeal exudate.  Eyes: Conjunctivae and EOM are normal. Pupils are equal, round, and reactive to light. Right eye exhibits no discharge. Left eye exhibits no discharge. No scleral icterus.  Neck: Neck supple. No JVD present. No tracheal deviation present. No thyromegaly present.  Cardiovascular: Normal rate, regular rhythm, normal heart sounds and intact distal pulses.  Exam reveals no gallop and no friction rub.   No murmur heard. Pulmonary/Chest: Effort normal and breath sounds normal. No respiratory distress. He has no wheezes. He has no rales. He exhibits no tenderness.  Abdominal: Soft. Bowel sounds are normal. He exhibits no distension and no mass. There is no tenderness. There is no rebound and no guarding.  Genitourinary: Rectum normal,  prostate normal and penis normal. Rectal exam shows guaiac negative stool. No penile tenderness.  Musculoskeletal: Normal range of motion. He exhibits no edema or tenderness.  Lymphadenopathy:    He has no cervical adenopathy.  Neurological: He is alert and oriented to person, place, and time. He has normal reflexes. No cranial nerve deficit. He exhibits normal muscle tone. Coordination normal.  Skin: Skin is warm and dry. No rash noted. He is not diaphoretic. No erythema. No pallor.  Psychiatric: He has a normal mood and affect. His behavior is normal. Judgment and thought content normal.          Assessment & Plan:  His CAD and HTN are stable. We will send him for fasting labs today to check his diabetes and hyperlipidemia  with an A1c and lipids. Set up a colonoscopy.  Alysia Penna, MD

## 2016-10-05 ENCOUNTER — Other Ambulatory Visit: Payer: Self-pay | Admitting: Cardiology

## 2016-10-15 DIAGNOSIS — R51 Headache: Secondary | ICD-10-CM | POA: Diagnosis not present

## 2016-10-15 DIAGNOSIS — E785 Hyperlipidemia, unspecified: Secondary | ICD-10-CM | POA: Diagnosis not present

## 2016-10-15 DIAGNOSIS — R69 Illness, unspecified: Secondary | ICD-10-CM | POA: Diagnosis not present

## 2016-10-15 DIAGNOSIS — F17210 Nicotine dependence, cigarettes, uncomplicated: Secondary | ICD-10-CM | POA: Diagnosis not present

## 2016-10-15 DIAGNOSIS — I252 Old myocardial infarction: Secondary | ICD-10-CM | POA: Diagnosis not present

## 2016-10-15 DIAGNOSIS — Z961 Presence of intraocular lens: Secondary | ICD-10-CM | POA: Diagnosis not present

## 2016-10-15 DIAGNOSIS — E119 Type 2 diabetes mellitus without complications: Secondary | ICD-10-CM | POA: Diagnosis not present

## 2016-10-15 DIAGNOSIS — H1851 Endothelial corneal dystrophy: Secondary | ICD-10-CM | POA: Diagnosis not present

## 2016-10-15 DIAGNOSIS — H538 Other visual disturbances: Secondary | ICD-10-CM | POA: Diagnosis not present

## 2016-10-15 DIAGNOSIS — I471 Supraventricular tachycardia: Secondary | ICD-10-CM | POA: Diagnosis not present

## 2016-10-15 DIAGNOSIS — I10 Essential (primary) hypertension: Secondary | ICD-10-CM | POA: Diagnosis not present

## 2016-10-15 DIAGNOSIS — I2510 Atherosclerotic heart disease of native coronary artery without angina pectoris: Secondary | ICD-10-CM | POA: Diagnosis not present

## 2016-11-02 ENCOUNTER — Other Ambulatory Visit: Payer: Self-pay | Admitting: Family Medicine

## 2016-11-05 ENCOUNTER — Telehealth: Payer: Self-pay | Admitting: Cardiology

## 2016-11-05 NOTE — Telephone Encounter (Signed)
New message     Pt c/o medication issue:  1. Name of Medication:  repatha 2. How are you currently taking this medication (dosage and times per day)?  140mg   3. Are you having a reaction (difficulty breathing--STAT)? no  4. What is your medication issue? Ins will not pay for repatha.  They want pt to go on praluent.  Please call

## 2016-11-09 ENCOUNTER — Encounter: Payer: Self-pay | Admitting: Pharmacist

## 2016-11-09 NOTE — Telephone Encounter (Signed)
This encounter was created in error - please disregard.

## 2016-11-10 NOTE — Telephone Encounter (Signed)
Spoke with wife - his insurance is now requiring Praluent.  Re-submited today thru [facility]

## 2016-11-11 ENCOUNTER — Telehealth: Payer: Self-pay | Admitting: Pharmacist Clinician (PhC)/ Clinical Pharmacy Specialist

## 2016-11-11 MED ORDER — ALIROCUMAB 150 MG/ML ~~LOC~~ SOPN: 150.0000 mg | PEN_INJECTOR | SUBCUTANEOUS | 3 refills | Status: DC

## 2016-11-11 NOTE — Telephone Encounter (Signed)
PA approved for Praluent 150 mg until Dec 31.  Spoke with patient wife, she will contact [facility] in [facility] and let us know if product still cost prohibitive.

## 2016-11-27 ENCOUNTER — Ambulatory Visit (INDEPENDENT_AMBULATORY_CARE_PROVIDER_SITE_OTHER): Payer: Medicare HMO | Admitting: Cardiology

## 2016-11-27 ENCOUNTER — Encounter: Payer: Self-pay | Admitting: Cardiology

## 2016-11-27 VITALS — BP 114/68 | HR 50 | Ht 69.0 in | Wt 194.0 lb

## 2016-11-27 DIAGNOSIS — I2510 Atherosclerotic heart disease of native coronary artery without angina pectoris: Secondary | ICD-10-CM

## 2016-11-27 DIAGNOSIS — R0602 Shortness of breath: Secondary | ICD-10-CM | POA: Diagnosis not present

## 2016-11-27 DIAGNOSIS — I10 Essential (primary) hypertension: Secondary | ICD-10-CM

## 2016-11-27 DIAGNOSIS — Z9861 Coronary angioplasty status: Secondary | ICD-10-CM | POA: Diagnosis not present

## 2016-11-27 DIAGNOSIS — I4589 Other specified conduction disorders: Secondary | ICD-10-CM | POA: Diagnosis not present

## 2016-11-27 DIAGNOSIS — I25119 Atherosclerotic heart disease of native coronary artery with unspecified angina pectoris: Secondary | ICD-10-CM | POA: Diagnosis not present

## 2016-11-27 DIAGNOSIS — E669 Obesity, unspecified: Secondary | ICD-10-CM | POA: Diagnosis not present

## 2016-11-27 DIAGNOSIS — E785 Hyperlipidemia, unspecified: Secondary | ICD-10-CM

## 2016-11-27 NOTE — Progress Notes (Addendum)
PCP: Laurey Morale, MD  Clinic Note: Chief Complaint  Patient presents with  . Follow-up  . Coronary Artery Disease  . Shortness of Breath    On exertion.    HPI: Joe Murray is a 76 y.o. male with a PMH below who presents today for six-month follow-up from CAD-PCI. non-STEMI-PCI f/u..-- s/p DES to mid RCA and DES to mid LAD on 02/26/2015--> currently enrolled in Twilight study that is about to stop --  Also Seen by Tommy Medal in Lipid clinic - now currently on PCSK-9 Inhibitor evaluation.   Joe Murray was last seen on April 2018. He was doing fairly well. He is still losing weight by cutting back his food intake and doing more exercise. No chest pain or pressure/angina with rest or exertion. Mostly limited by arthritis pains.  Recent Hospitalizations: None  Studies Reviewed: None   Interval History: Joe Murray returns today overall doing fairly well. He states that he is seems though probably plateaued a little bit with his weight loss. He is right hip is been bothering him and has been limiting his walking. As result he has not been doing the same amount of exercise. As such however, he is not having any resting or exertional chest tightness, pressure or dyspnea beyond his baseline exertional dyspnea from deconditioning.  He is arthritis really limits and the right hip being the most notable culprit, but he is other joints also bother him making exercise very difficult.  He denies any PND, orthopnea or edema. No rapid irregular heartbeats palpitations.  He is excited that he had right corneal transplant in the interim since I last saw him and can now see much better on this side. His wife is hoping that he will go for the left eye next year.  Other Cardiovascular ROS: negative for - chest pain, dyspnea on exertion, edema, irregular heartbeat, murmur, orthopnea, palpitations, paroxysmal nocturnal dyspnea, rapid heart rate, shortness of breath or syncope/near syncope,  TIA/amaurosis fugax; claudication. No melena, and had a cheesy, hematuria or epistaxis.  Smoking: -- still smokes ~2 cigars/day .  ROS: A comprehensive was performed. Review of Systems  Constitutional: Positive for malaise/fatigue (Energy level is improving.).  HENT: Positive for hearing loss. Negative for congestion.        Morning rhinorrhea  Eyes:       - R eye vision notably improved -- post corneal transplant  Respiratory: Negative for cough and wheezing.   Musculoskeletal: Positive for back pain and joint pain (Knees - but mostly the R hip).  Neurological: Positive for dizziness (Notably improved since cutting back on his diabetes medication). Negative for focal weakness.  Endo/Heme/Allergies: Negative for environmental allergies. Does not bruise/bleed easily.  Psychiatric/Behavioral: Negative for depression and memory loss. The patient is not nervous/anxious and does not have insomnia.   All other systems reviewed and are negative.   Past Medical History:  Diagnosis Date  . CAD S/P percutaneous coronary angioplasty 10/'03; 3/'09; 7/*09; 1/'17    a. Dr. Ellyn Hack; '03 - Cypher DES 3.0 mm 33 mm proximal-mid LAD (details D1 with ostial 60%); 3/'09 dRCA 2.75 mm x 13 mm Cypher DES (2.8 mm); 7/'09 pRCA 3.0 mm x 18 m Cypher DES (3.25 mm);; (2009) 2D Echo - EF 55%, (November 2014) nonischemic Myoview;; b. Promus DES to RCA and mid LAD 02/26/2015  . Chronotropic incompetence 12/2011   For Exertional Dyspnea:  MET Test - EKG-sinus bradycardia, no arrhythmia/ectopy;; treadmill stress test portion of Myoview: Able to achieve  85% maximum heart rate.  . Diabetes mellitus type 2 in obese (HCC)    CAD  . Diverticulosis   . Dyslipidemia, goal LDL below 70    on [employer] study medicaion.  . ED (erectile dysfunction)   . GERD (gastroesophageal reflux disease)   . Hemorrhoids 07/2002   Internal and External  . Insomnia   . Non-STEMI (non-ST elevated myocardial infarction) ([place]) 11/2001   a.  Proximal LAD tandem lesions -- long DES stent covering both;; b. Jan 2017: mRCA PCI, [place] PCI  . Persistent sinus bradycardia   . Personal history of colonic adenomas 09/06/2012  . Psoriasis    Resting HR in 54s  . Spasm of esophagus   . SVT (supraventricular tachycardia) ([place]) 2003  . Vertigo, benign positional     Past Surgical History:  Procedure Laterality Date  . CARDIAC CATHETERIZATION N/A 02/26/2015   Procedure: Left Heart Cath and Coronary Angiography;  Surgeon: Leonie Man, MD: mRCA 99% --> PCI. mLAD 75%-> FFR Guided PCI, Mod ISR in pLAD & pRCA  . CARDIAC CATHETERIZATION N/A 02/26/2015   Procedure: Coronary Stent Intervention;  Surgeon: Leonie Man, MD;  Location: [facility]: mRCA Promus Premier DES 3.0 x 12 (3.5), mLAD Promus Premier DES 2.75 x 20 (3.0)  . CARDIAC CATHETERIZATION N/A 02/26/2015   Procedure: Intravascular Pressure Wire/FFR Study;  Surgeon: Leonie Man, MD;  Location: [facility];  Service: Cardiovascular: LAD 75% - FFR + -> PCI  . CATARACT EXTRACTION  [id number]  . COLONOSCOPY  08-31-12   per Dr. Carlean Purl, adenomatous polyps, repeat in 3 years   . CORONARY ANGIOPLASTY WITH STENT PLACEMENT  LAD - 2003, RCA 3 & 7/ '09   Cypher 3.0 x 32 mid LAD; Cyper 2.75 x 13 - distal RCA, 3.0 x 18 Prox RCA  . ESOPHAGOGASTRODUODENOSCOPY  08-11-02   esophageal dilation per Dr. Carlean Purl  . HERNIA REPAIR     right inguinal   . NM MYOVIEW LTD  Nov 2014   ~8 METS; EF 60%, no ischemia or infarction  . PERCUTANEOUS CORONARY STENT INTERVENTION (PCI-S)  11/23/2001   NSTEMI: Prox-Mid LAD tandem ~80% lesions on either side of D1 (with 80% lesion) -- Cypher DES 3.0 mm x 33 m (covering both lesions)   . PERCUTANEOUS CORONARY STENT INTERVENTION (PCI-S)  04/27/2007   Unstable Angina: Distal RCA 95%: Cypher 2.75x13  (2.8 mm); residual focal ~60%ISR in LAD stent, 60% D1 ostial (no PTCA on LAD due to no ischemia on ST)  . PERCUTANEOUS CORONARY STENT INTERVENTION (PCI-S)  09/21/2007     Bradycardia & Unstable Angina: Prox RCA 70% - PCI Cypher DES 3.0 mm x 18 mm  (3.25 mm); ostial 60-70% jailed D1. LAD & distal RCA stents patent  . SHOULDER SURGERY     left rotator cuff, Dr. Gladstone Lighter  . TRANSTHORACIC ECHOCARDIOGRAM  March 2009   Normal LV size and function, EF 55%. Mild MR; mild RV dilation.  . TRANSTHORACIC ECHOCARDIOGRAM  02/2015   EF 60-65%. Moderate concentric LVH. Normal function with normal regional wall motion. GR 1 DD  . WRIST FRACTURE SURGERY     right    Current Meds  Medication Sig  . amLODipine (NORVASC) 2.5 MG tablet TAKE 1 TABLET BY MOUTH ONCE DAILY  . Cholecalciferol (VITAMIN D3) 5000 UNITS CAPS Take 5,000 Units by mouth daily.  . Cinnamon 500 MG capsule Take 2,000 mg by mouth daily.   . Evolocumab (REPATHA SURECLICK) 630 MG/ML SOAJ Inject 140  mg into the skin every 14 (fourteen) days.  . fish oil-omega-3 fatty acids 1000 MG capsule Take 2 g by mouth daily.    Marland Kitchen glipiZIDE (GLUCOTROL) 5 MG tablet TAKE ONE TABLET BY MOUTH TWICE DAILY BEFORE MEALS  . Lancets (BD LANCET ULTRAFINE 30G) MISC USE ONE  TO CHECK GLUCOSE ONCE DAILY  . MAGNESIUM CHLORIDE PO Take 1 tablet by mouth daily.    . meclizine (ANTIVERT) 25 MG tablet TAKE ONE TABLET BY MOUTH THREE TIMES DAILY AS NEEDED FOR DIZZINESS  . nitroGLYCERIN (NITROSTAT) 0.4 MG SL tablet Place 0.4 mg under the tongue every 5 (five) minutes as needed for chest pain. Reported on 08/16/2015  . ONE TOUCH ULTRA TEST test strip USE ONE STRIP TO CHECK GLUCOSE ONCE DAILY  . potassium gluconate 595 MG TABS tablet Take 595 mg by mouth.  . ticagrelor (BRILINTA) 90 MG TABS tablet Take 1 tablet (90 mg total) by mouth 2 (two) times daily.    Allergies  Allergen Reactions  . Beta Adrenergic Blockers Other (See Comments)    chronotropic incompetence  . Sulfonamide Derivatives     unknown  . Statins Other (See Comments)    REACTION: myalgias    Social History   Social History  . Marital status: Married    Spouse name: N/A   . Number of children: 4  . Years of education: N/A   Occupational History  . retired    Social History Main Topics  . Smoking status: Current Every Day Smoker    Types: Cigars    Start date: 07/06/1952  . Smokeless tobacco: Never Used     Comment: 1-2 per day; he says "I really don't inhale"  . Alcohol use 0.0 oz/week     Comment: occ  . Drug use: No  . Sexual activity: No   Other Topics Concern  . None   Social History Narrative   He is a married, father of 37, grandfather 3.   He still smokes 2-3 cigars per day. States that he "does not really inhale ". He is not really all that it's including, stating that he wants to have his 1 remaining vice.   Exercises only on occasion.   He does various landscaping jobs including cutting wood, and clearing brush.    family history includes Dementia in his father; Diabetes in his mother; Hearing loss in his father; Heart attack in his brother; Heart attack (age of onset: 44) in his father.  Wt Readings from Last 3 Encounters:  11/27/16 194 lb (88 kg)  08/25/16 191 lb (86.6 kg)  05/28/16 191 lb 3.2 oz (86.7 kg)  -- No further wgt loss- diet adjustment (lower portions) & increased activity.  PHYSICAL EXAM BP 114/68   Pulse (!) 50   Ht '5\' 9"'  (1.753 m)   Wt 194 lb (88 kg)   BMI 28.65 kg/m   Physical Exam  Constitutional: He is oriented to person, place, and time. He appears well-developed and well-nourished. No distress.  HENT:  Head: Normocephalic and atraumatic.  Very hard of hearing.  Neck: Normal range of motion. Neck supple. No hepatojugular reflux and no JVD present. Carotid bruit is not present.  Cardiovascular: Regular rhythm, normal heart sounds, intact distal pulses and normal pulses.   Occasional extrasystoles are present. Bradycardia present.  PMI is not displaced.  Exam reveals no gallop and no friction rub.   No murmur heard. Pulmonary/Chest: Effort normal and breath sounds normal. No respiratory distress. He has no  wheezes.  He has no rales.  Abdominal: Soft. Bowel sounds are normal. He exhibits no distension. There is no tenderness. There is no rebound.  Musculoskeletal: Normal range of motion. He exhibits no edema.  Neurological: He is alert and oriented to person, place, and time.  Skin: Skin is warm and dry. No rash noted. No erythema.  Psychiatric: He has a normal mood and affect. His behavior is normal. Judgment and thought content normal.  Nursing note and vitals reviewed.    Adult ECG Report  Rate: 48 ;  Rhythm: sinus bradycardia and LAFB. Normal invtervals & voltage.;   Narrative Interpretation: stable    Other studies Reviewed: Additional studies/ records that were reviewed today include:  Recent Labs:    Lab Results  Component Value Date   CHOL 140 08/25/2016   HDL 55.00 08/25/2016   LDLCALC 53 08/25/2016   LDLDIRECT 85.5 06/27/2012   TRIG 164.0 (H) 08/25/2016   CHOLHDL 3 08/25/2016    ASSESSMENT / PLAN: Joe Murray continues to do very well overall from a cardiac standpoint. No active issues. No active angina or heart failure. He has resting bradycardia and therefore is not on beta blocker. This is also competent about chronotropic incompetence. I suspect some of his exertional dyspnea is related to chronotropic incompetence, but not yet meeting criteria for pacemaker. Blood pressure well controlled on low-dose amlodipine only.  Lipids were controlled with Repatha. Exertional dyspnea seems to be able. Question if some is related to cardiac incompetence. No anginal symptoms.  He enjoys his cigars, and is not interested in stopping  Problem List Items Addressed This Visit    CAD S/P percutaneous coronary angioplasty - LADx2, & RCA x 3 (Chronic)    Doing well from a CAD standpoint. No active angina or heart failure. Excellent on beta blocker due to resting bradycardia and fatigue. On amlodipine angina affect. Remains on Brilinta without aspirin. On Repatha      Chronotropic  incompetence (Chronic)    He probably still does have some chronotropic incompetence, but not the point of requiring pacemaker. Continue to monitor for bradycardia. Unable to use beta blocker or non-dihydropyridine calcium channel blocker      Coronary artery disease involving native coronary artery of native heart with angina pectoris (HCC) - Primary (Chronic)    Multiple different episodes of angina and non-STEMI.  No further angina or heart failure symptoms since his most recent PCI.  Not on beta blocker for bradycardia. Not on ACE inhibitor because of hypotension/orthostatic hypotension. On low-dose calcium channel blocker for and angina/spasm. On Brilinta without aspirin.-- With extensive stents in overlapping stents, prefer Brilinta over Plavix. On Repatha, not on statin due to intolerance.      Dyslipidemia, goal LDL below 70 (Chronic)    Excellent control with Repatha. Being followed by [facility] Pharm Team.      Essential hypertension (Chronic)    Well-controlled with low-dose amlodipine.\ACE inhibitor discontinued secondary to orthostatic hypotension.      Exertional shortness of breath    Chronic issue - likely multifactorial - deconditioning, bradycardia with ? Chronotropic incompetence along with arthritis.  If progresses, may need to reassess HR responsiveness.      Obesity (BMI 30-39.9) (Chronic)    Unfortunately, he has gone the wrong direction. Discussed importance of continued dietary modification and continued exercise.         Current medicines are reviewed at length with the patient today. (+/- concerns) -- He has reduced his glipizide dose because of dizziness The following changes  have been made: n/a  Patient Instructions  NO CHANGE WITH CURRENT MEDICATIONS   Your physician wants you to follow-up in [place]. You will receive a reminder letter in the mail two months in advance. If you don't receive a letter, please call our office to  schedule the follow-up appointment.   If you need a refill on your cardiac medications before your next appointment, please call your pharmacy.   Studies Ordered:   No orders of the defined types were placed in this encounter.     Glenetta Hew, M.D., M.S. Interventional Cardiologist   Pager # [phone] Phone # [phone] [address] [place], [place] [address]

## 2016-11-27 NOTE — Patient Instructions (Signed)
NO CHANGE WITH CURRENT MEDICATIONS     Your physician wants you to follow-up in 12 MONTH WITH DR [name]. You will receive a reminder letter in the mail two months in advance. If you don't receive a letter, please call our office to schedule the follow-up appointment.     If you need a refill on your cardiac medications before your next appointment, please call your pharmacy.  

## 2016-11-29 ENCOUNTER — Encounter: Payer: Self-pay | Admitting: Cardiology

## 2016-12-01 NOTE — Assessment & Plan Note (Signed)
Doing well from a CAD standpoint. No active angina or heart failure. Excellent on beta blocker due to resting bradycardia and fatigue. On amlodipine angina affect. Remains on Brilinta without aspirin. On Repatha

## 2016-12-01 NOTE — Assessment & Plan Note (Signed)
He probably still does have some chronotropic incompetence, but not the point of requiring pacemaker. Continue to monitor for bradycardia. Unable to use beta blocker or non-dihydropyridine calcium channel blocker

## 2016-12-01 NOTE — Assessment & Plan Note (Signed)
Excellent control with Repatha. Being followed by [facility] Team.

## 2016-12-01 NOTE — Assessment & Plan Note (Signed)
Chronic issue - likely multifactorial - deconditioning, bradycardia with ? Chronotropic incompetence along with arthritis.  If progresses, may need to reassess HR responsiveness.

## 2016-12-01 NOTE — Assessment & Plan Note (Signed)
Well-controlled with low-dose amlodipine.\ACE inhibitor discontinued secondary to orthostatic hypotension.

## 2016-12-01 NOTE — Assessment & Plan Note (Addendum)
Multiple different episodes of angina and non-STEMI.  No further angina or heart failure symptoms since his most recent PCI.  Not on beta blocker for bradycardia. Not on ACE inhibitor because of hypotension/orthostatic hypotension. On low-dose calcium channel blocker for and angina/spasm. On Brilinta without aspirin.-- With extensive stents in overlapping stents, prefer Brilinta over Plavix. On Repatha, not on statin due to intolerance.

## 2016-12-01 NOTE — Assessment & Plan Note (Signed)
Unfortunately, he has gone the wrong direction. Discussed importance of continued dietary modification and continued exercise.

## 2016-12-02 ENCOUNTER — Telehealth: Payer: Self-pay | Admitting: Pharmacist Clinician (PhC)/ Clinical Pharmacy Specialist

## 2016-12-02 MED ORDER — ALIROCUMAB 150 MG/ML ~~LOC~~ SOPN: 150.0000 mg | PEN_INJECTOR | SUBCUTANEOUS | 12 refills | Status: DC

## 2016-12-02 NOTE — Telephone Encounter (Signed)
Spoke with wife.  Copay for Praluent (preferred med) is $414/month.   They don't qualify for assistance due to income greater than $80,000/year.   Will fill for now, but we offered to sample qom to help with cost.  Wife also considering doing injections every 3-4 weeks as well.

## 2016-12-14 ENCOUNTER — Other Ambulatory Visit: Payer: Self-pay | Admitting: Physician Assistant

## 2016-12-14 DIAGNOSIS — R69 Illness, unspecified: Secondary | ICD-10-CM | POA: Diagnosis not present

## 2016-12-14 NOTE — Telephone Encounter (Signed)
Please review for refill. Thanks!  

## 2016-12-26 DIAGNOSIS — Z23 Encounter for immunization: Secondary | ICD-10-CM | POA: Diagnosis not present

## 2017-01-06 DIAGNOSIS — Z961 Presence of intraocular lens: Secondary | ICD-10-CM | POA: Diagnosis not present

## 2017-01-06 DIAGNOSIS — Z947 Corneal transplant status: Secondary | ICD-10-CM | POA: Diagnosis not present

## 2017-01-06 DIAGNOSIS — H1851 Endothelial corneal dystrophy: Secondary | ICD-10-CM | POA: Diagnosis not present

## 2017-01-18 ENCOUNTER — Other Ambulatory Visit: Payer: Self-pay | Admitting: Cardiology

## 2017-02-04 ENCOUNTER — Other Ambulatory Visit: Payer: Self-pay | Admitting: Pharmacist Clinician (PhC)/ Clinical Pharmacy Specialist

## 2017-02-04 DIAGNOSIS — E785 Hyperlipidemia, unspecified: Secondary | ICD-10-CM

## 2017-02-08 ENCOUNTER — Telehealth: Payer: Self-pay | Admitting: Pharmacist

## 2017-02-08 DIAGNOSIS — E785 Hyperlipidemia, unspecified: Secondary | ICD-10-CM | POA: Diagnosis not present

## 2017-02-08 LAB — LIPID PANEL
Chol/HDL Ratio: 2.5 ratio (ref 0.0–5.0)
Cholesterol, Total: 152 mg/dL (ref 100–199)
HDL: 62 mg/dL
LDL Calculated: 66 mg/dL (ref 0–99)
Triglycerides: 122 mg/dL (ref 0–149)
VLDL Cholesterol Cal: 24 mg/dL (ref 5–40)

## 2017-02-08 NOTE — Telephone Encounter (Signed)
Medication Samples have been provided to the patient.  Drug name: Praluent      Strength: 150mg        Qty: 2   LOT: [id number]  Exp.Date: 03-2018    The patient has been instructed regarding the correct time, dose, and frequency of taking this medication, including desired effects and most common side effects.   Keonta Alsip Rodriguez-Guzman PharmD, BCPS, [place] [address] 02/08/2017 11:02 AM

## 2017-03-03 ENCOUNTER — Other Ambulatory Visit: Payer: Self-pay | Admitting: Pharmacist Clinician (PhC)/ Clinical Pharmacy Specialist

## 2017-03-03 MED ORDER — EVOLOCUMAB 140 MG/ML ~~LOC~~ SOAJ: 140.0000 mg | SUBCUTANEOUS | 12 refills | Status: DC

## 2017-03-22 ENCOUNTER — Telehealth: Payer: Self-pay | Admitting: Family Medicine

## 2017-03-22 DIAGNOSIS — H1851 Endothelial corneal dystrophy: Secondary | ICD-10-CM | POA: Diagnosis not present

## 2017-03-22 DIAGNOSIS — Z961 Presence of intraocular lens: Secondary | ICD-10-CM | POA: Diagnosis not present

## 2017-03-22 DIAGNOSIS — Z947 Corneal transplant status: Secondary | ICD-10-CM | POA: Diagnosis not present

## 2017-03-22 NOTE — Telephone Encounter (Signed)
Placed for PCP to sign.

## 2017-03-22 NOTE — Telephone Encounter (Signed)
Handicap Placard to be filled out, placed in dr's folder.  Upon completion, mail to: [address] [place], [place] [address]

## 2017-03-23 NOTE — Telephone Encounter (Signed)
Called and spoke with pt. Pt advised and voiced understanding. Copy placed up front for pick up. Copy placed to be scanned into pt's chart.

## 2017-03-23 NOTE — Telephone Encounter (Signed)
Ready for pick up

## 2017-04-27 ENCOUNTER — Other Ambulatory Visit: Payer: Self-pay | Admitting: Cardiology

## 2017-05-18 ENCOUNTER — Other Ambulatory Visit: Payer: Self-pay | Admitting: Family Medicine

## 2017-06-16 DIAGNOSIS — R3121 Asymptomatic microscopic hematuria: Secondary | ICD-10-CM | POA: Diagnosis not present

## 2017-06-16 DIAGNOSIS — N401 Enlarged prostate with lower urinary tract symptoms: Secondary | ICD-10-CM | POA: Diagnosis not present

## 2017-06-16 DIAGNOSIS — R3911 Hesitancy of micturition: Secondary | ICD-10-CM | POA: Diagnosis not present

## 2017-06-16 DIAGNOSIS — R972 Elevated prostate specific antigen [PSA]: Secondary | ICD-10-CM | POA: Diagnosis not present

## 2017-08-27 ENCOUNTER — Encounter: Payer: Self-pay | Admitting: *Deleted

## 2017-08-27 ENCOUNTER — Encounter: Payer: Self-pay | Admitting: Family Medicine

## 2017-08-27 ENCOUNTER — Telehealth: Payer: Self-pay

## 2017-08-27 ENCOUNTER — Ambulatory Visit (INDEPENDENT_AMBULATORY_CARE_PROVIDER_SITE_OTHER): Payer: Medicare HMO | Admitting: Family Medicine

## 2017-08-27 VITALS — BP 100/74 | HR 49 | Temp 98.0°F | Ht 69.0 in | Wt 199.6 lb

## 2017-08-27 DIAGNOSIS — Z86010 Personal history of colonic polyps: Secondary | ICD-10-CM

## 2017-08-27 DIAGNOSIS — N401 Enlarged prostate with lower urinary tract symptoms: Secondary | ICD-10-CM | POA: Diagnosis not present

## 2017-08-27 DIAGNOSIS — N138 Other obstructive and reflux uropathy: Secondary | ICD-10-CM

## 2017-08-27 DIAGNOSIS — I10 Essential (primary) hypertension: Secondary | ICD-10-CM | POA: Diagnosis not present

## 2017-08-27 DIAGNOSIS — E118 Type 2 diabetes mellitus with unspecified complications: Secondary | ICD-10-CM | POA: Diagnosis not present

## 2017-08-27 DIAGNOSIS — I25119 Atherosclerotic heart disease of native coronary artery with unspecified angina pectoris: Secondary | ICD-10-CM

## 2017-08-27 DIAGNOSIS — E559 Vitamin D deficiency, unspecified: Secondary | ICD-10-CM | POA: Diagnosis not present

## 2017-08-27 DIAGNOSIS — R0602 Shortness of breath: Secondary | ICD-10-CM

## 2017-08-27 DIAGNOSIS — R7989 Other specified abnormal findings of blood chemistry: Secondary | ICD-10-CM

## 2017-08-27 DIAGNOSIS — M7061 Trochanteric bursitis, right hip: Secondary | ICD-10-CM | POA: Diagnosis not present

## 2017-08-27 DIAGNOSIS — E785 Hyperlipidemia, unspecified: Secondary | ICD-10-CM | POA: Diagnosis not present

## 2017-08-27 DIAGNOSIS — M7062 Trochanteric bursitis, left hip: Secondary | ICD-10-CM | POA: Diagnosis not present

## 2017-08-27 LAB — HEPATIC FUNCTION PANEL
ALBUMIN: 4.4 g/dL (ref 3.5–5.2)
ALT: 13 U/L (ref 0–53)
AST: 15 U/L (ref 0–37)
Alkaline Phosphatase: 49 U/L (ref 39–117)
BILIRUBIN TOTAL: 0.4 mg/dL (ref 0.2–1.2)
Bilirubin, Direct: 0.1 mg/dL (ref 0.0–0.3)
Total Protein: 6.8 g/dL (ref 6.0–8.3)

## 2017-08-27 LAB — POC URINALSYSI DIPSTICK (AUTOMATED)
Bilirubin, UA: NEGATIVE
Glucose, UA: NEGATIVE
Ketones, UA: NEGATIVE
LEUKOCYTES UA: NEGATIVE
NITRITE UA: NEGATIVE
PH UA: 6 (ref 5.0–8.0)
Protein, UA: POSITIVE — AB
Spec Grav, UA: 1.02 (ref 1.010–1.025)
UROBILINOGEN UA: 0.2 U/dL

## 2017-08-27 LAB — BASIC METABOLIC PANEL
BUN: 19 mg/dL (ref 6–23)
CHLORIDE: 102 meq/L (ref 96–112)
CO2: 25 mEq/L (ref 19–32)
Calcium: 9.4 mg/dL (ref 8.4–10.5)
Creatinine, Ser: 1.27 mg/dL (ref 0.40–1.50)
GFR: 58.36 mL/min — AB (ref >60.00)
Glucose, Bld: 144 mg/dL — ABNORMAL HIGH (ref 70–99)
POTASSIUM: 4.6 meq/L (ref 3.5–5.1)
Sodium: 138 mEq/L (ref 135–145)

## 2017-08-27 LAB — CBC WITH DIFFERENTIAL/PLATELET
BASOS ABS: 0 10*3/uL (ref 0.0–0.1)
Basophils Relative: 0.7 % (ref 0.0–3.0)
EOS ABS: 0.3 10*3/uL (ref 0.0–0.7)
Eosinophils Relative: 4.2 % (ref 0.0–5.0)
HEMATOCRIT: 43.8 % (ref 39.0–52.0)
HEMOGLOBIN: 15 g/dL (ref 13.0–17.0)
LYMPHS PCT: 28.4 % (ref 12.0–46.0)
Lymphs Abs: 2 10*3/uL (ref 0.7–4.0)
MCHC: 34.2 g/dL (ref 30.0–36.0)
MCV: 94.9 fl (ref 78.0–100.0)
MONOS PCT: 8.8 % (ref 3.0–12.0)
Monocytes Absolute: 0.6 10*3/uL (ref 0.1–1.0)
Neutro Abs: 4 10*3/uL (ref 1.4–7.7)
Neutrophils Relative %: 57.9 % (ref 43.0–77.0)
Platelets: 220 10*3/uL (ref 150.0–400.0)
RBC: 4.62 Mil/uL (ref 4.22–5.81)
RDW: 13.2 % (ref 11.5–15.5)
WBC: 7 10*3/uL (ref 4.0–10.5)

## 2017-08-27 LAB — LIPID PANEL
CHOL/HDL RATIO: 4
Cholesterol: 193 mg/dL (ref 0–200)
HDL: 53.4 mg/dL (ref >39.00)
LDL CALC: 117 mg/dL — AB (ref 0–99)
NONHDL: 139.82
TRIGLYCERIDES: 112 mg/dL (ref 0.0–149.0)
VLDL: 22.4 mg/dL (ref 0.0–40.0)

## 2017-08-27 LAB — HEMOGLOBIN A1C: Hgb A1c MFr Bld: 6.7 % — ABNORMAL HIGH (ref 4.6–6.5)

## 2017-08-27 LAB — PSA: PSA: 1.51 ng/mL (ref 0.10–4.00)

## 2017-08-27 LAB — TSH: TSH: 1.83 u[IU]/mL (ref 0.35–4.50)

## 2017-08-27 LAB — VITAMIN D 25 HYDROXY (VIT D DEFICIENCY, FRACTURES): VITD: 105.19 ng/mL (ref 30.00–100.00)

## 2017-08-27 NOTE — Telephone Encounter (Signed)
Hope @ [facility] called with a CRITICAL RESULT on pt's VitD=105  CRITICAL VALUE STICKER  CRITICAL VALUE: VitD = 105  RECEIVER (on-site recipient of call):  Chalese Peach   DATE & TIME NOTIFIED:  08/27/17 @ 3:45pm  MESSENGER (representative from lab): Hope  MD NOTIFIED: Sarajane Jews  TIME OF NOTIFICATION: 08/27/17 @ 3:45pm

## 2017-08-27 NOTE — Telephone Encounter (Signed)
I called the pt and informed his wife of the results.  Lab appt scheduled for 10/1.

## 2017-08-27 NOTE — Telephone Encounter (Signed)
His vitamin D level is too high. Tell him to cut this back to taking a capsule EVERY OTHER  day. Recheck a level in 90 days

## 2017-08-27 NOTE — Progress Notes (Signed)
   Subjective:    Patient ID: Joe Murray, male    DOB: [birth year], 77 y.o.   MRN: [id number]  HPI Here to follow up on issues. He sees Dr. Ellyn Hack regularly for his CAD, for HTN, and for dyslipidemia. He had been using Repatha but he felt it was causing some muscle aches, so he stopped taking this 3 weeks ago. His BP has been stable. His fasting glucoses run from 110 to 140. He saw Dr. Baruch Gouty at [facility]rology last year and he had an unremarkable cystoscopy. His PSA was normal. Dr. Pilar Jarvis said he has benign microscopic hematuria and no further workup is needed. Ioan does mention several months of intermittent pain in both lateral hips.    Review of Systems  Constitutional: Positive for fatigue.  HENT: Negative.   Eyes: Negative.   Respiratory: Negative.   Cardiovascular: Negative.   Gastrointestinal: Negative.   Genitourinary: Negative.   Musculoskeletal: Positive for arthralgias and myalgias.  Skin: Negative.   Neurological: Negative.   Psychiatric/Behavioral: Negative.        Objective:   Physical Exam  Constitutional: He is oriented to person, place, and time. He appears well-developed and well-nourished. No distress.  HENT:  Head: Normocephalic and atraumatic.  Right Ear: External ear normal.  Left Ear: External ear normal.  Nose: Nose normal.  Mouth/Throat: Oropharynx is clear and moist. No oropharyngeal exudate.  Eyes: Pupils are equal, round, and reactive to light. Conjunctivae and EOM are normal. Right eye exhibits no discharge. Left eye exhibits no discharge. No scleral icterus.  Neck: Neck supple. No JVD present. No tracheal deviation present. No thyromegaly present.  Cardiovascular: Normal rate, regular rhythm, normal heart sounds and intact distal pulses. Exam reveals no gallop and no friction rub.  No murmur heard. Pulmonary/Chest: Effort normal and breath sounds normal. No respiratory distress. He has no wheezes. He has no rales. He exhibits no tenderness.    Abdominal: Soft. Bowel sounds are normal. He exhibits no distension and no mass. There is no tenderness. There is no rebound and no guarding.  Genitourinary: Rectum normal, prostate normal and penis normal. Rectal exam shows guaiac negative stool. No penile tenderness.  Musculoskeletal: Normal range of motion. He exhibits no edema.  He is tender over the left greater trochanter   Lymphadenopathy:    He has no cervical adenopathy.  Neurological: He is alert and oriented to person, place, and time. He has normal reflexes. He displays normal reflexes. No cranial nerve deficit. He exhibits normal muscle tone. Coordination normal.  Skin: Skin is warm and dry. No rash noted. He is not diaphoretic. No erythema. No pallor.  Psychiatric: He has a normal mood and affect. His behavior is normal. Judgment and thought content normal.          Assessment & Plan:  His CAD and HTN are stable. He has stopped Repatha so we will check a lipid panel today. I suggested he contact Dr. Ellyn Hack to let him know what is going on and to get advice. We will check an A1c for the diabetes. Set up a colonoscopy. Refer to Orthopedics for the trochanteric bursitis.  Alysia Penna, MD

## 2017-09-04 DIAGNOSIS — M7061 Trochanteric bursitis, right hip: Secondary | ICD-10-CM | POA: Insufficient documentation

## 2017-09-04 DIAGNOSIS — M7062 Trochanteric bursitis, left hip: Secondary | ICD-10-CM | POA: Diagnosis not present

## 2017-09-04 DIAGNOSIS — M79652 Pain in left thigh: Secondary | ICD-10-CM | POA: Diagnosis not present

## 2017-09-23 ENCOUNTER — Telehealth: Payer: Self-pay | Admitting: Family Medicine

## 2017-09-23 NOTE — Telephone Encounter (Unsigned)
Copied from [place] [id number]. Topic: Quick Communication - See Telephone Encounter >> Sep 23, 2017  2:38 PM Neva Seat wrote: Pt had colo  2014.  Wanting to know why is he having to have one so soon since there was no issues then? Please call pt back to discuss.

## 2017-09-24 NOTE — Telephone Encounter (Signed)
Sent to PCP to explain so that I can inform pt.

## 2017-09-27 ENCOUNTER — Other Ambulatory Visit: Payer: Self-pay | Admitting: Cardiology

## 2017-09-28 NOTE — Telephone Encounter (Signed)
Those polyps were precancerous so the GI doctor said to repeat it in 3 years

## 2017-09-28 NOTE — Telephone Encounter (Signed)
Rx request sent to pharmacy.  

## 2017-09-28 NOTE — Telephone Encounter (Signed)
Spoke to patients wife. She expressed understanding and stated they will make an appointment to have the colonoscopy done.

## 2017-10-08 ENCOUNTER — Encounter: Payer: Self-pay | Admitting: Internal Medicine

## 2017-11-03 DIAGNOSIS — Z961 Presence of intraocular lens: Secondary | ICD-10-CM | POA: Diagnosis not present

## 2017-11-03 DIAGNOSIS — H1851 Endothelial corneal dystrophy: Secondary | ICD-10-CM | POA: Diagnosis not present

## 2017-11-03 DIAGNOSIS — Z947 Corneal transplant status: Secondary | ICD-10-CM | POA: Diagnosis not present

## 2017-11-23 ENCOUNTER — Other Ambulatory Visit (INDEPENDENT_AMBULATORY_CARE_PROVIDER_SITE_OTHER): Payer: Medicare HMO

## 2017-11-23 DIAGNOSIS — R7989 Other specified abnormal findings of blood chemistry: Secondary | ICD-10-CM

## 2017-11-23 LAB — VITAMIN D 25 HYDROXY (VIT D DEFICIENCY, FRACTURES): VITD: 88.51 ng/mL (ref 30.00–100.00)

## 2017-12-01 ENCOUNTER — Ambulatory Visit: Payer: Medicare HMO | Admitting: Cardiology

## 2017-12-01 ENCOUNTER — Encounter: Payer: Self-pay | Admitting: Cardiology

## 2017-12-01 VITALS — BP 130/80 | HR 48 | Ht 69.0 in | Wt 198.0 lb

## 2017-12-01 DIAGNOSIS — I4589 Other specified conduction disorders: Secondary | ICD-10-CM | POA: Diagnosis not present

## 2017-12-01 DIAGNOSIS — Z9861 Coronary angioplasty status: Secondary | ICD-10-CM | POA: Diagnosis not present

## 2017-12-01 DIAGNOSIS — I739 Peripheral vascular disease, unspecified: Secondary | ICD-10-CM | POA: Diagnosis not present

## 2017-12-01 DIAGNOSIS — E785 Hyperlipidemia, unspecified: Secondary | ICD-10-CM

## 2017-12-01 DIAGNOSIS — I2510 Atherosclerotic heart disease of native coronary artery without angina pectoris: Secondary | ICD-10-CM

## 2017-12-01 DIAGNOSIS — I10 Essential (primary) hypertension: Secondary | ICD-10-CM

## 2017-12-01 MED ORDER — TICAGRELOR 90 MG PO TABS: 90.0000 mg | ORAL_TABLET | Freq: Two times a day (BID) | ORAL | 3 refills | Status: DC

## 2017-12-01 NOTE — Assessment & Plan Note (Addendum)
Continue to hold AV nodal Agents -->  At this point, I am really concerned that he can only walk 100 feet without having to stop. Plan: GXT to assess HR responsiveness.  If unable to get his heart rate up into the 90s, would probably consider referral to EP for possible pacemaker placement for chronotropic incompetence.

## 2017-12-01 NOTE — Progress Notes (Signed)
PCP: Laurey Morale, MD  Clinic Note: Chief Complaint  Patient presents with  . Follow-up    Notes fatigue and leg discomfort with walking.  . Coronary Artery Disease  . Bradycardia    Question comments  . Hyperlipidemia    Had been on Praluent, doing well.  Converted to [place] but had.  Was abnormal reaction.    HPI: Joe Murray is a 77 y.o. male with a PMH notable for non-STEMI with two-vessel PCI below who presents today for annual follow-up.  February 26, 2015: Non-STEMI-PCI f/u..-- s/p DES to mid RCA and DES to mid LAD -->  initially enrolled in [place] study. --  Also Seen by Tommy Medal in Lipid clinic - now currently on PCSK-9 Inhibitor evaluation.  -- Stopped Repatha due to insurance, was then converted to [place].  He stopped this secondary to?? Rash & legs Hurting. --> went away after stopping Praluent.   Joe Murray was last seen on October 2018. He was doing fairly well.  Noted that his weight loss plateaued.  Was also not doing as much exercise.  Right hip pain was limiting his exercise.Marland Kitchen No chest pain or pressure/angina with rest or exertion.  Bradycardia did not quite meet criteria for chronotropic incompetence/pacemaker (still avoiding AV nodal agents).  Recent Hospitalizations: None  Studies Reviewed: None   Interval History: Joe Murray returns today overall doing well - only c/o "allergy reaction to Praluent".  He had been switched from [place] to [employer] for insurance reasons.He broke out in a rash all over his chest and arms shortly after starting Praluent.  He is now no longer taking either 1.    Other than that, he also notes that his legs just tired and weak whenever walking.  He says he can walk about 100 yards at the most without having to stop to catch his breath and rest.  He really says that he just feels tired.  He continues to be a very difficult historian and I cannot really get from him if it is dyspnea or claudication, or any chest  discomfort or just fatigue.  Regardless he does have significant exercise intolerance.  He can get back to walking if he stops and rests a little bit.  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  He is not having any chest tightness or pressure with rest or exertion.  Nothing like his anginal symptoms.  He really does not have any PND, orthopnea and only mild intermittent edema.  He certainly does not have any rapid irregular heartbeats palpitations.  And he has no way of knowing whether his heart rate goes up when he walks.   Other Cardiovascular ROS: positive for - dyspnea on exertion and Fatigue, legs aching with walking negative for - chest pain, edema, irregular heartbeat, murmur, orthopnea, palpitations, paroxysmal nocturnal dyspnea, rapid heart rate, shortness of breath or syncope/near syncope, TIA/amaurosis fugax  Smoking: -- still smokes ~2 cigars/day --has no intention of stopping.  ROS: A comprehensive was performed. Review of Systems  Constitutional: Positive for malaise/fatigue (Has lack of get up and go, but once he is up and going, he has just go slow.). Negative for weight loss.  HENT: Positive for hearing loss. Negative for congestion.        Morning rhinorrhea  Eyes:       - R eye vision notably improved -- post corneal transplant  Respiratory: Negative for cough and wheezing.   Gastrointestinal: Negative for blood in stool and melena.  Genitourinary: Negative for hematuria.  Musculoskeletal: Positive for back pain and joint pain (Knees - but mostly the R hip).  Neurological: Positive for dizziness (Notably improved since cutting back on his diabetes medication). Negative for focal weakness (Nothing other than his legs being weak when he walks).  Endo/Heme/Allergies: Negative for environmental allergies. Does not bruise/bleed easily.  Psychiatric/Behavioral: Negative for depression and  memory loss. The patient is not nervous/anxious and does not have insomnia.   All other systems reviewed and are negative.   Past Medical History:  Diagnosis Date  . CAD S/P percutaneous coronary angioplasty 10/'03; 3/'09; 7/*09; 1/'17    a. Dr. Ellyn Hack; '03 - Cypher DES 3.0 mm 33 mm proximal-mid LAD (details D1 with ostial 60%); 3/'09 dRCA 2.75 mm x 13 mm Cypher DES (2.8 mm); 7/'09 pRCA 3.0 mm x 18 m Cypher DES (3.25 mm);; (2009) 2D Echo - EF 55%, (November 2014) nonischemic Myoview;; b. Promus DES to RCA and mid LAD 02/26/2015  . Chronotropic incompetence 12/2011   For Exertional Dyspnea:  MET Test - EKG-sinus bradycardia, no arrhythmia/ectopy;; treadmill stress test portion of Myoview: Able to achieve 85% maximum heart rate.  . Diabetes mellitus type 2 in obese (HCC)    CAD  . Diverticulosis   . Dyslipidemia, goal LDL below 70    on [employer] study medicaion.  . ED (erectile dysfunction)   . GERD (gastroesophageal reflux disease)   . Hemorrhoids 07/2002   Internal and External  . Insomnia   . Non-STEMI (non-ST elevated myocardial infarction) ([place]) 11/2001   a. Proximal LAD tandem lesions -- long DES stent covering both;; b. Jan 2017: mRCA PCI, [place] PCI  . Persistent sinus bradycardia   . Personal history of colonic adenomas 09/06/2012  . Psoriasis    Resting HR in 54s  . Spasm of esophagus   . SVT (supraventricular tachycardia) ([facility]) 2003  . Vertigo, benign positional     Past Surgical History:  Procedure Laterality Date  . CARDIAC CATHETERIZATION N/A 02/26/2015   Procedure: Left Heart Cath and Coronary Angiography;  Surgeon: Leonie Man, MD: mRCA 99% --> PCI. mLAD 75%-> FFR Guided PCI, Mod ISR in pLAD & pRCA  . CARDIAC CATHETERIZATION N/A 02/26/2015   Procedure: Coronary Stent Intervention;  Surgeon: Leonie Man, MD;  Location: [facility]: mRCA Promus Premier DES 3.0 x 12 (3.5), mLAD Promus Premier DES 2.75 x 20 (3.0)  . CARDIAC CATHETERIZATION N/A 02/26/2015    Procedure: Intravascular Pressure Wire/FFR Study;  Surgeon: Leonie Man, MD;  Location: [facility];  Service: Cardiovascular: LAD 75% - FFR + -> PCI  . CATARACT EXTRACTION  [id number]  . COLONOSCOPY  08-31-12   per Dr. Carlean Purl, adenomatous polyps, repeat in 3 years   . CORONARY ANGIOPLASTY WITH STENT PLACEMENT  LAD - 2003, RCA 3 & 7/ '09   Cypher 3.0 x 32 mid LAD; Cyper 2.75 x 13 - distal RCA, 3.0 x 18 Prox RCA  . ESOPHAGOGASTRODUODENOSCOPY  08-11-02   esophageal dilation per Dr. Carlean Purl  . HERNIA REPAIR     right inguinal   . NM MYOVIEW LTD  Nov 2014   ~8 METS; EF 60%, no ischemia or infarction  . PERCUTANEOUS CORONARY STENT INTERVENTION (PCI-S)  11/23/2001   NSTEMI: Prox-Mid LAD tandem ~80% lesions on either side of D1 (with 80% lesion) -- Cypher DES 3.0 mm x 33 m (covering  both lesions)   . PERCUTANEOUS CORONARY STENT INTERVENTION (PCI-S)  04/27/2007   Unstable Angina: Distal RCA 95%: Cypher 2.75x13  (2.8 mm); residual focal ~60%ISR in LAD stent, 60% D1 ostial (no PTCA on LAD due to no ischemia on ST)  . PERCUTANEOUS CORONARY STENT INTERVENTION (PCI-S)  09/21/2007   Bradycardia & Unstable Angina: Prox RCA 70% - PCI Cypher DES 3.0 mm x 18 mm  (3.25 mm); ostial 60-70% jailed D1. LAD & distal RCA stents patent  . SHOULDER SURGERY     left rotator cuff, Dr. Gladstone Lighter  . TRANSTHORACIC ECHOCARDIOGRAM  March 2009   Normal LV size and function, EF 55%. Mild MR; mild RV dilation.  . TRANSTHORACIC ECHOCARDIOGRAM  02/2015   EF 60-65%. Moderate concentric LVH. Normal function with normal regional wall motion. GR 1 DD  . WRIST FRACTURE SURGERY     right    Current Meds  Medication Sig  . amLODipine (NORVASC) 2.5 MG tablet TAKE 1 TABLET BY MOUTH ONCE DAILY  . Cholecalciferol (VITAMIN D3) 5000 UNITS CAPS Take 5,000 Units by mouth daily.  . Cinnamon 500 MG capsule Take 2,000 mg by mouth daily.   . fish oil-omega-3 fatty acids 1000 MG capsule Take 2 g by mouth daily.    Marland Kitchen glipiZIDE (GLUCOTROL)  5 MG tablet TAKE 1 TABLET BY MOUTH TWICE DAILY BEFORE MEALS  . Lancets (BD LANCET ULTRAFINE 30G) MISC USE ONE  TO CHECK GLUCOSE ONCE DAILY  . MAGNESIUM CHLORIDE PO Take 1 tablet by mouth daily.    . meclizine (ANTIVERT) 25 MG tablet TAKE ONE TABLET BY MOUTH THREE TIMES DAILY AS NEEDED FOR DIZZINESS  . nitroGLYCERIN (NITROSTAT) 0.4 MG SL tablet PLACE 1 TABLET UNDER THE TONGUE EVERY 5 MINUTES FOR 3 DOSES AS NEEDED FOR CHEST PAIN  . ONE TOUCH ULTRA TEST test strip USE ONE STRIP TO CHECK GLUCOSE ONCE DAILY  . potassium gluconate 595 MG TABS tablet Take 595 mg by mouth.  . [DISCONTINUED] ticagrelor (BRILINTA) 90 MG TABS tablet Take 1 tablet by mouth 2 (two) times daily.    Allergies  Allergen Reactions  . Beta Adrenergic Blockers Other (See Comments)    chronotropic incompetence  . Praluent [Alirocumab]     Diffuse Rash - improved when stopping & taking Benadryl  . Sulfonamide Derivatives     unknown  . Statins Other (See Comments)    REACTION: myalgias    Social History   Tobacco Use  . Smoking status: Current Every Day Smoker    Types: Cigars    Start date: 07/06/1952  . Smokeless tobacco: Never Used  . Tobacco comment: 1-2 per day; he says "I really don't inhale"  Substance Use Topics  . Alcohol use: Yes    Alcohol/week: 0.0 standard drinks    Comment: occ  . Drug use: No   Social History   Social History Narrative   He is a married, father of 59, grandfather 3.   He still smokes 2-3 cigars per day. States that he "does not really inhale ". He is not really all that it's including, stating that he wants to have his 1 remaining vice.   Exercises only on occasion.   He does various landscaping jobs including cutting wood, and clearing brush.    Family History family history includes Dementia in his father; Diabetes in his mother; Hearing loss in his father; Heart attack in his brother; Heart attack (age of onset: 72) in his father.  Wt Readings from Last 3 Encounters:  12/02/17 202 lb (91.6 kg)  12/01/17 198 lb (89.8 kg)  08/27/17 199 lb 9.6 oz (90.5 kg)  -- No further wgt loss- diet adjustment (lower portions) & increased activity.  PHYSICAL EXAM BP 130/80   Pulse (!) 48   Ht _0  (1.753 m)   Wt 198 lb (89.8 kg)   SpO2 96%   BMI 29.24 kg/m   Physical Exam  Constitutional: He is oriented to person, place, and time. He appears well-developed and well-nourished. No distress.  HENT:  Head: Normocephalic and atraumatic.  Very hard of hearing.  Neck: Normal range of motion. Neck supple. No hepatojugular reflux and no JVD present. Carotid bruit is not present.  Cardiovascular: Regular rhythm, normal heart sounds, intact distal pulses and normal pulses.  Occasional extrasystoles are present. Bradycardia present. PMI is not displaced. Exam reveals no gallop and no friction rub.  No murmur heard. Pulmonary/Chest: Effort normal and breath sounds normal. No respiratory distress. He has no wheezes. He has no rales.  Abdominal: Soft. Bowel sounds are normal. He exhibits no distension. There is no tenderness. There is no rebound.  Musculoskeletal: Normal range of motion. He exhibits no edema.  Neurological: He is alert and oriented to person, place, and time.  Skin: Skin is warm and dry. No rash noted. No erythema.  Psychiatric: He has a normal mood and affect. His behavior is normal. Judgment and thought content normal.  Nursing note and vitals reviewed.   Adult ECG Report   Rate: 48 ;  Rhythm: sinus bradycardia and LAFB. Normal invtervals & voltage.; (Axis -58)  Narrative Interpretation: stable   Other studies Reviewed: Additional studies/ records that were reviewed today include:  Recent Labs:   Stopped Repatha  Lab Results  Component Value Date   CHOL 193 08/27/2017   HDL 53.40 08/27/2017   LDLCALC 117 (H) 08/27/2017   LDLDIRECT 85.5 06/27/2012   TRIG 112.0 08/27/2017   CHOLHDL 4 08/27/2017    ASSESSMENT / PLAN:  Joe Murray continues to do very  well overall from a cardiac standpoint. No active issues. No active angina or heart failure. He has resting bradycardia and therefore is not on beta blocker. This is also competent about chronotropic incompetence. I suspect some of his exertional dyspnea is related to chronotropic incompetence, but not yet meeting criteria for pacemaker. Blood pressure well controlled on low-dose amlodipine only.  Lipids were controlled with Repatha. Exertional dyspnea seems to be able. Question if some is related to chronotropic incompetence. No anginal symptoms.  He enjoys his cigars, and is not interested in stopping  Problem List Items Addressed This Visit    CAD S/P percutaneous coronary angioplasty - LADx2, & RCA x 3 (Chronic)    For DES stent, continues to be on Brilinta. At follow-up visit, we can convert to 60 mg twice daily.  Would then consider (pending lower extremity arterial Doppler evaluation) converting to low-dose Xarelto plus aspirin.      Relevant Orders   EKG 12-Lead (Completed)   Exercise Tolerance Test   Chronotropic incompetence - Primary (Chronic)    Continue to hold AV nodal Agents -->  At this point, I am really concerned that he can only walk 100 feet without having to stop. Plan: GXT to assess HR responsiveness.  If unable to get his heart rate up into the 90s, would probably consider referral to EP for possible pacemaker placement for chronotropic incompetence.      Coronary artery disease involving native coronary artery of native heart without  angina pectoris (Chronic)    He is quite familiar with his angina and non-STEMI type anginal pain.  No recurrent symptoms. Clearly not beta-blocker because of bradycardia Has not been on ACE inhibitor or ARB because of orthostatic hypotension.  He is only on low-dose amlodipine. He is on Brilinta 90 mg twice daily --> we can probably reduce to maintenance dose of 60 mg twice daily versus consider converting to aspirin and low-dose  Xarelto.      Relevant Orders   EKG 12-Lead (Completed)   Exercise Tolerance Test   Dyslipidemia, goal LDL below 70 (Chronic)    Unfortunately, was well controlled on PCSK9 inhibitor.  But now was switched from one to the other because of insurance and then had a reaction to Praluent.  His LDL has gone up dramatically since being off of PCSK9 inhibitor.  Will refer back to CV RR to determine if we can potentially get him covered now for Repatha since he did fine on Repatha.  May need to have lipids rechecked.       Essential hypertension (Chronic)    Given his history of orthostatic hypotension, we are maintaining him on low-dose amlodipine for antianginal benefit.      Intermittent claudication ([place])    He has leg fatigue with walking which could be claudication, or to simply could be chronotropic incompetence. Need to exclude PAD: Plan: Check LEA Dopplers/ABIs.      Relevant Orders   VAS [place] LOWER EXTREMITY VENOUS REFLUX   VAS [place] LOWER EXTREMITY ARTERIAL DUPLEX   VAS [place] LOWER EXT ART SEG MULTI (SEGMENTALS & LE RAYNAUDS)     Client had several issues today the need to be addressed: The chronotropic incompetence concerned with fatigue that could also be considered claudication.  We also then had to discuss his lipid issues with the Repatha versus Praluent.  We spent well over 45 minutes discussing his symptoms, exam and plan of action. >> 50% of the time was in direct patient counseling.   Current medicines are reviewed at length with the patient today. (+/- concerns) -- He has reduced his glipizide dose because of dizziness The following changes have been made: n/a  Patient Instructions  Medication Instructions:  Your Physician recommend you continue on your current medication as directed.    If you need a refill on your cardiac medications before your next appointment, please call your pharmacy.   Lab work: None   Testing/Procedures: Your physician has requested  that you have a lowerextremity venous duplex. This test is an ultrasound of the veins in the legs or arms. It looks at venous blood flow that carries blood from the heart to the legs or arms. Allow one hour for a Lower Venous exam. Allow thirty minutes for an Upper Venous exam. There are no restrictions or special instructions. [place]  Your physician has requested that you have a lower extremity arterial exercise duplex. During this test, exercise and ultrasound are used to evaluate arterial blood flow in the legs. Allow one hour for this exam. There are no restrictions or special instructions.  Your physician has requested that you have an ankle brachial index (ABI). During this test an ultrasound and blood pressure cuff are used to evaluate the arteries that supply the arms and legs with blood. Allow thirty minutes for this exam. There are no restrictions or special instructions. [place]    Follow-Up: At [facility], you and your health needs are  our priority.  As part of our continuing mission to provide you with exceptional heart care, we have created designated Provider Care Teams.  These Care Teams include your primary Cardiologist (physician) and Advanced Practice Providers (APPs -  Physician Assistants and Nurse Practitioners) who all work together to provide you with the care you need, when you need it. You will need a follow up appointment in 6 months.  Please call our office 2 months in advance to schedule this appointment.  You may see Dr. Ellyn Hack or one of the following Advanced Practice Providers on your designated Care Team:   Rosaria Ferries, PA-C . Jory Sims, DNP, ANP  Your physician recommends that you schedule a follow-up appointment Lipid Clinic.   Any Other Special Instructions Will Be Listed Below (If Applicable).     Studies Ordered:   Orders Placed This Encounter  Procedures  . Exercise Tolerance Test  . EKG  12-Lead      Glenetta Hew, M.D., M.S. Interventional Cardiologist   Pager # [phone] Phone # [phone] [address]. [place], [place] [address]

## 2017-12-01 NOTE — Patient Instructions (Addendum)
Medication Instructions:  Your Physician recommend you continue on your current medication as directed.    If you need a refill on your cardiac medications before your next appointment, please call your pharmacy.   Lab work: None   Testing/Procedures: Your physician has requested that you have a lowerextremity venous duplex. This test is an ultrasound of the veins in the legs or arms. It looks at venous blood flow that carries blood from the heart to the legs or arms. Allow one hour for a Lower Venous exam. Allow thirty minutes for an Upper Venous exam. There are no restrictions or special instructions. [place]  Your physician has requested that you have a lower extremity arterial exercise duplex. During this test, exercise and ultrasound are used to evaluate arterial blood flow in the legs. Allow one hour for this exam. There are no restrictions or special instructions.  Your physician has requested that you have an ankle brachial index (ABI). During this test an ultrasound and blood pressure cuff are used to evaluate the arteries that supply the arms and legs with blood. Allow thirty minutes for this exam. There are no restrictions or special instructions. [place]    Follow-Up: At [facility], you and your health needs are our priority.  As part of our continuing mission to provide you with exceptional heart care, we have created designated Provider Care Teams.  These Care Teams include your primary Cardiologist (physician) and Advanced Practice Providers (APPs -  Physician Assistants and Nurse Practitioners) who all work together to provide you with the care you need, when you need it. You will need a follow up appointment in 6 months.  Please call our office 2 months in advance to schedule this appointment.  You may see Dr. Ellyn Hack or one of the following Advanced Practice Providers on your designated Care Team:   Rosaria Ferries,  PA-C . Jory Sims, DNP, ANP  Your physician recommends that you schedule a follow-up appointment Lipid Clinic.   Any Other Special Instructions Will Be Listed Below (If Applicable).

## 2017-12-02 ENCOUNTER — Telehealth: Payer: Self-pay | Admitting: Emergency Medicine

## 2017-12-02 ENCOUNTER — Ambulatory Visit: Payer: Medicare HMO | Admitting: Physician Assistant

## 2017-12-02 ENCOUNTER — Encounter: Payer: Self-pay | Admitting: Cardiology

## 2017-12-02 ENCOUNTER — Encounter: Payer: Self-pay | Admitting: Physician Assistant

## 2017-12-02 VITALS — BP 98/58 | HR 57 | Ht 69.0 in | Wt 202.0 lb

## 2017-12-02 DIAGNOSIS — Z86010 Personal history of colonic polyps: Secondary | ICD-10-CM | POA: Diagnosis not present

## 2017-12-02 DIAGNOSIS — K5900 Constipation, unspecified: Secondary | ICD-10-CM

## 2017-12-02 DIAGNOSIS — Z7901 Long term (current) use of anticoagulants: Secondary | ICD-10-CM | POA: Diagnosis not present

## 2017-12-02 NOTE — Assessment & Plan Note (Addendum)
Unfortunately, was well controlled on PCSK9 inhibitor.  But now was switched from one to the other because of insurance and then had a reaction to Praluent.  His LDL has gone up dramatically since being off of PCSK9 inhibitor.  Will refer back to CV RR to determine if we can potentially get him covered now for Repatha since he did fine on Repatha.  May need to have lipids rechecked.

## 2017-12-02 NOTE — Telephone Encounter (Signed)
Dr. Ellyn Hack,   This patient is scheduled for a colonoscopy on 12/16/17. Do you think that he is stable from a cardiac standpoint or does procedure need to be delayed until after his cardiac testing?   If he is stable are you ok with him holding his Brillinta 5 days prior to procedure?  Please route response back to [employer], CMA.   Thanks,    Tinnie Gens, [place]

## 2017-12-02 NOTE — Assessment & Plan Note (Signed)
Given his history of orthostatic hypotension, we are maintaining him on low-dose amlodipine for antianginal benefit.

## 2017-12-02 NOTE — Assessment & Plan Note (Signed)
He has leg fatigue with walking which could be claudication, or to simply could be chronotropic incompetence. Need to exclude PAD: Plan: Check LEA Dopplers/ABIs.

## 2017-12-02 NOTE — Progress Notes (Signed)
Chief Complaint: Preprocedural visit for surveillance colonoscopy in a patient on chronic anticoagulation  HPI:    Joe Murray is a 77 year old male with a past medical history of CAD status post percutaneous coronary angioplasty on Brilinta (LVEF 02/25/2015 60-65%), diabetes, non-STEMI and multiple others listed below, who presents clinic today for preprocedural visit for surveillance colonoscopy.  Pathology showed 3 small adenomas of the max 6 mm, repeat, was recommended 08/2015.    08/31/2012 colonoscopy with Dr. Carlean Purl with 3 sessile polyps measuring 2-6 mm in size in the cecum, transverse colon and descending colon.  Moderate diverticulosis in the left colon.  Otherwise normal.    12/01/2017 patient followed with his cardiologist Dr. Ellyn Hack at that time it was discussed that the patient was doing well from a cardiac standpoint with resting bradycardia and some exertional dyspnea which was thought related to chronotropic incompetence not yet meeting criteria for pacemaker.  Patient was arranged for a lower venous exam and upper venous exam as well as an exercise tolerance test scheduled 12/09/2017.    Today, patient presents to clinic accompanied by his wife who tells me that she discussed patient's upcoming colonoscopy with his cardiologist yesterday and he said it was okay for the patient to hold his blood thinner as well as be put to sleep for this exam regardless of the testing coming up.  Patient describes that he has just been weak when getting up to walk around and is not short of breath.    Patient does complain of an increase in constipation/difficulty with bowel movements.  He is using Ex-Lax about once a month in order to "get back on track", but tells me that he continues to strain in between times.  They have also tried Senokot in the past which did not help.    Social history positive for going to visit their son in [place] and their daughter in [employer] [place] [employer] in November.    Denies fever,  chills, weight loss, anorexia, nausea, vomiting, abdominal pain or symptoms that awaken him from sleep.  Past Medical History:  Diagnosis Date  . CAD S/P percutaneous coronary angioplasty 10/'03; 3/'09; 7/*09; 1/'17    a. Dr. Ellyn Hack; '03 - Cypher DES 3.0 mm 33 mm proximal-mid LAD (details D1 with ostial 60%); 3/'09 dRCA 2.75 mm x 13 mm Cypher DES (2.8 mm); 7/'09 pRCA 3.0 mm x 18 m Cypher DES (3.25 mm);; (2009) 2D Echo - EF 55%, (November 2014) nonischemic Myoview;; b. Promus DES to RCA and mid LAD 02/26/2015  . Chronotropic incompetence 12/2011   For Exertional Dyspnea:  MET Test - EKG-sinus bradycardia, no arrhythmia/ectopy;; treadmill stress test portion of Myoview: Able to achieve 85% maximum heart rate.  . Diabetes mellitus type 2 in obese (HCC)    CAD  . Diverticulosis   . Dyslipidemia, goal LDL below 70    on PharmQuest study medicaion.  . ED (erectile dysfunction)   . GERD (gastroesophageal reflux disease)   . Hemorrhoids 07/2002   Internal and External  . Insomnia   . Non-STEMI (non-ST elevated myocardial infarction) ([place]) 11/2001   a. Proximal LAD tandem lesions -- long DES stent covering both;; b. Jan 2017: mRCA PCI, [place] PCI  . Persistent sinus bradycardia   . Personal history of colonic adenomas 09/06/2012  . Psoriasis    Resting HR in 54s  . Spasm of esophagus   . SVT (supraventricular tachycardia) ([place]) 2003  . Vertigo, benign positional     Past Surgical History:  Procedure Laterality  Date  . CARDIAC CATHETERIZATION N/A 02/26/2015   Procedure: Left Heart Cath and Coronary Angiography;  Surgeon: Leonie Man, MD: mRCA 99% --> PCI. mLAD 75%-> FFR Guided PCI, Mod ISR in pLAD & pRCA  . CARDIAC CATHETERIZATION N/A 02/26/2015   Procedure: Coronary Stent Intervention;  Surgeon: Leonie Man, MD;  Location: [facility]: mRCA Promus Premier DES 3.0 x 12 (3.5), mLAD Promus Premier DES 2.75 x 20 (3.0)  . CARDIAC CATHETERIZATION N/A 02/26/2015   Procedure: Intravascular  Pressure Wire/FFR Study;  Surgeon: Leonie Man, MD;  Location: [facility];  Service: Cardiovascular: LAD 75% - FFR + -> PCI  . CATARACT EXTRACTION  [id number]  . COLONOSCOPY  08-31-12   per Dr. Carlean Purl, adenomatous polyps, repeat in 3 years   . CORONARY ANGIOPLASTY WITH STENT PLACEMENT  LAD - 2003, RCA 3 & 7/ '09   Cypher 3.0 x 32 mid LAD; Cyper 2.75 x 13 - distal RCA, 3.0 x 18 Prox RCA  . ESOPHAGOGASTRODUODENOSCOPY  08-11-02   esophageal dilation per Dr. Carlean Purl  . HERNIA REPAIR     right inguinal   . NM MYOVIEW LTD  Nov 2014   ~8 METS; EF 60%, no ischemia or infarction  . PERCUTANEOUS CORONARY STENT INTERVENTION (PCI-S)  11/23/2001   NSTEMI: Prox-Mid LAD tandem ~80% lesions on either side of D1 (with 80% lesion) -- Cypher DES 3.0 mm x 33 m (covering both lesions)   . PERCUTANEOUS CORONARY STENT INTERVENTION (PCI-S)  04/27/2007   Unstable Angina: Distal RCA 95%: Cypher 2.75x13  (2.8 mm); residual focal ~60%ISR in LAD stent, 60% D1 ostial (no PTCA on LAD due to no ischemia on ST)  . PERCUTANEOUS CORONARY STENT INTERVENTION (PCI-S)  09/21/2007   Bradycardia & Unstable Angina: Prox RCA 70% - PCI Cypher DES 3.0 mm x 18 mm  (3.25 mm); ostial 60-70% jailed D1. LAD & distal RCA stents patent  . SHOULDER SURGERY     left rotator cuff, Dr. Gladstone Lighter  . TRANSTHORACIC ECHOCARDIOGRAM  March 2009   Normal LV size and function, EF 55%. Mild MR; mild RV dilation.  . TRANSTHORACIC ECHOCARDIOGRAM  02/2015   EF 60-65%. Moderate concentric LVH. Normal function with normal regional wall motion. GR 1 DD  . WRIST FRACTURE SURGERY     right    Current Outpatient Medications  Medication Sig Dispense Refill  . Alirocumab (PRALUENT) 150 MG/ML SOPN Inject 150 mg into the skin every 14 (fourteen) days. (Patient not taking: Reported on 12/01/2017) 2 pen 12  . amLODipine (NORVASC) 2.5 MG tablet TAKE 1 TABLET BY MOUTH ONCE DAILY 90 tablet 2  . Cholecalciferol (VITAMIN D3) 5000 UNITS CAPS Take 5,000 Units by mouth  daily. 30 capsule 0  . Cinnamon 500 MG capsule Take 2,000 mg by mouth daily.     . Evolocumab (REPATHA SURECLICK) 409 MG/ML SOAJ Inject 140 mg into the skin every 14 (fourteen) days. (Patient not taking: Reported on 12/01/2017) 2 pen 12  . fish oil-omega-3 fatty acids 1000 MG capsule Take 2 g by mouth daily.      Marland Kitchen glipiZIDE (GLUCOTROL) 5 MG tablet TAKE 1 TABLET BY MOUTH TWICE DAILY BEFORE MEALS 180 tablet 1  . Lancets (BD LANCET ULTRAFINE 30G) MISC USE ONE  TO CHECK GLUCOSE ONCE DAILY 100 each 1  . MAGNESIUM CHLORIDE PO Take 1 tablet by mouth daily.      . meclizine (ANTIVERT) 25 MG tablet TAKE ONE TABLET BY MOUTH THREE TIMES DAILY AS NEEDED  FOR DIZZINESS 90 tablet 0  . nitroGLYCERIN (NITROSTAT) 0.4 MG SL tablet PLACE 1 TABLET UNDER THE TONGUE EVERY 5 MINUTES FOR 3 DOSES AS NEEDED FOR CHEST PAIN 25 tablet 3  . ONE TOUCH ULTRA TEST test strip USE ONE STRIP TO CHECK GLUCOSE ONCE DAILY 100 each 1  . potassium gluconate 595 MG TABS tablet Take 595 mg by mouth.    . ticagrelor (BRILINTA) 90 MG TABS tablet Take 1 tablet by mouth 2 (two) times daily.    . ticagrelor (BRILINTA) 90 MG TABS tablet Take 1 tablet (90 mg total) by mouth 2 (two) times daily. 180 tablet 3   No current facility-administered medications for this visit.     Allergies as of 12/02/2017 - Review Complete 08/27/2017  Allergen Reaction Noted  . Beta adrenergic blockers Other (See Comments) 04/02/2015  . Praluent [alirocumab]  12/01/2017  . Sulfonamide derivatives  11/18/2006  . Statins Other (See Comments) 05/01/2009    Family History  Problem Relation Age of Onset  . Diabetes Mother   . Hearing loss Father   . Dementia Father   . Heart attack Father 65       first MI prior to age 3  . Heart attack Brother     Social History   Socioeconomic History  . Marital status: Married    Spouse name: Not on file  . Number of children: 4  . Years of education: Not on file  . Highest education level: Not on file    Occupational History  . Occupation: retired  Scientific laboratory technician  . Financial resource strain: Not on file  . Food insecurity:    Worry: Not on file    Inability: Not on file  . Transportation needs:    Medical: Not on file    Non-medical: Not on file  Tobacco Use  . Smoking status: Current Every Day Smoker    Types: Cigars    Start date: 07/06/1952  . Smokeless tobacco: Never Used  . Tobacco comment: 1-2 per day; he says "I really don't inhale"  Substance and Sexual Activity  . Alcohol use: Yes    Alcohol/week: 0.0 standard drinks    Comment: occ  . Drug use: No  . Sexual activity: Never  Lifestyle  . Physical activity:    Days per week: Not on file    Minutes per session: Not on file  . Stress: Not on file  Relationships  . Social connections:    Talks on phone: Not on file    Gets together: Not on file    Attends religious service: Not on file    Active member of club or organization: Not on file    Attends meetings of clubs or organizations: Not on file    Relationship status: Not on file  . Intimate partner violence:    Fear of current or ex partner: Not on file    Emotionally abused: Not on file    Physically abused: Not on file    Forced sexual activity: Not on file  Other Topics Concern  . Not on file  Social History Narrative   He is a married, father of 17, grandfather 3.   He still smokes 2-3 cigars per day. States that he "does not really inhale ". He is not really all that it's including, stating that he wants to have his 1 remaining vice.   Exercises only on occasion.   He does various landscaping jobs including cutting wood, and clearing brush.  Review of Systems:    Constitutional: No weight loss, fever or chills Skin: No rash  Cardiovascular: No chest pain Respiratory: No SOB Gastrointestinal: See HPI and otherwise negative Genitourinary: No dysuria  Neurological: No headache, dizziness or syncope Musculoskeletal: No new muscle or joint  pain Hematologic: No bleeding Psychiatric: No history of depression or anxiety   Physical Exam:  Vital signs: BP (!) 98/58   Pulse (!) 57   Ht '5\' 9"'  (1.753 m)   Wt 202 lb (91.6 kg)   BMI 29.83 kg/m   Constitutional:   Pleasant Caucasian male appears to be in NAD, Well developed, Well nourished, alert and cooperative Head:  Normocephalic and atraumatic. Eyes:   PEERL, EOMI. No icterus. Conjunctiva pink. Ears:  Normal auditory acuity. Neck:  Supple Throat: Oral cavity and pharynx without inflammation, swelling or lesion.  Respiratory: Respirations even and unlabored. Lungs clear to auscultation bilaterally.   No wheezes, crackles, or rhonchi.  Cardiovascular: Normal S1, S2. No MRG. Regular rate and rhythm. No peripheral edema, cyanosis or pallor.  Gastrointestinal:  Soft, nondistended, nontender. No rebound or guarding. Normal bowel sounds. No appreciable masses or hepatomegaly. Rectal:  Not performed.  Msk:  Symmetrical without gross deformities. Without edema, no deformity or joint abnormality.  Neurologic:  Alert and  oriented x4;  Blank expression, limited facial movements Skin:   Dry and intact without significant lesions or rashes. Psychiatric:  Demonstrates good judgement and reason without abnormal affect or behaviors.  MOST RECENT LABS AND IMAGING: CBC    Component Value Date/Time   WBC 7.0 08/27/2017 0912   RBC 4.62 08/27/2017 0912   HGB 15.0 08/27/2017 0912   HCT 43.8 08/27/2017 0912   PLT 220.0 08/27/2017 0912   MCV 94.9 08/27/2017 0912   MCH 30.7 02/27/2015 0311   MCHC 34.2 08/27/2017 0912   RDW 13.2 08/27/2017 0912   LYMPHSABS 2.0 08/27/2017 0912   MONOABS 0.6 08/27/2017 0912   EOSABS 0.3 08/27/2017 0912   BASOSABS 0.0 08/27/2017 0912    CMP     Component Value Date/Time   NA 138 08/27/2017 0912   K 4.6 08/27/2017 0912   CL 102 08/27/2017 0912   CO2 25 08/27/2017 0912   GLUCOSE 144 (H) 08/27/2017 0912   BUN 19 08/27/2017 0912   CREATININE 1.27  08/27/2017 0912   CREATININE 1.18 03/06/2015 0859   CALCIUM 9.4 08/27/2017 0912   PROT 6.8 08/27/2017 0912   ALBUMIN 4.4 08/27/2017 0912   AST 15 08/27/2017 0912   ALT 13 08/27/2017 0912   ALKPHOS 49 08/27/2017 0912   BILITOT 0.4 08/27/2017 0912   GFRNONAA 55 (L) 02/27/2015 0311   GFRAA >60 02/27/2015 0311    Assessment: 1.  History of adenomatous polyps: Last colonoscopy 08/31/2012 with recommendations to repeat in 3 years 2.  Chronic anticoagulation: On Brilinta for CAD 3.  Constipation: For the patient over the past year or so, currently using Ex-Lax once a month but still straining for stools in between  Plan: 1.  Patient is already scheduled for a surveillance colonoscopy in the [place] with Dr. Carlean Purl on October 24.  Reviewed risk, benefits, limitations and alternatives and the patient agrees to proceed. 2.  Discussed with patient that we will send Dr. Ellyn Hack a note to make sure that he is cleared from a cardiac standpoint for these procedures.  Also advised the patient to hold his Brilinta for 5 days prior to his procedure.  We will communicate with his cardiologist to ensure that holding  his Kary Kos is acceptable for him. 3.  Briefly discussed that if patient has an abnormal exercise tolerance test on October 17 we may need to delay procedure.  Per patient's wife, the cardiologist did not see this as a problem 4.  Recommend the patient start MiraLAX once daily for his constipation 5.  Patient to follow in clinic per recommendations from Dr. Carlean Purl after time of procedure.  Ellouise Newer, PA-C [facility] 12/02/2017, 10:13 AM  Cc: Laurey Morale, MD

## 2017-12-02 NOTE — Patient Instructions (Signed)
Start Miralax once daily.   You have been scheduled for a colonoscopy. Please follow written instructions given to you at your visit today.  Please pick up your prep supplies at the pharmacy within the next 1-3 days. If you use inhalers (even only as needed), please bring them with you on the day of your procedure. Your physician has requested that you go to www.startemmi.com and enter the access code given to you at your visit today. This web site gives a general overview about your procedure. However, you should still follow specific instructions given to you by our office regarding your preparation for the procedure.

## 2017-12-02 NOTE — Assessment & Plan Note (Signed)
For DES stent, continues to be on Brilinta. At follow-up visit, we can convert to 60 mg twice daily.  Would then consider (pending lower extremity arterial Doppler evaluation) converting to low-dose Xarelto plus aspirin.

## 2017-12-02 NOTE — Telephone Encounter (Signed)
He is stable.  Is okay to hold Brilinta.  Underscore at about chronotropic incompetence.  Evaluate for that, but that should not keep him from having his procedure.  Would just watch for low heart rates with sedation.  Glenetta Hew, MD

## 2017-12-02 NOTE — Assessment & Plan Note (Signed)
He is quite familiar with his angina and non-STEMI type anginal pain.  No recurrent symptoms. Clearly not beta-blocker because of bradycardia Has not been on ACE inhibitor or ARB because of orthostatic hypotension.  He is only on low-dose amlodipine. He is on Brilinta 90 mg twice daily --> we can probably reduce to maintenance dose of 60 mg twice daily versus consider converting to aspirin and low-dose Xarelto.

## 2017-12-03 ENCOUNTER — Ambulatory Visit (HOSPITAL_COMMUNITY)
Admission: RE | Admit: 2017-12-03 | Discharge: 2017-12-03 | Disposition: A | Discharge status: Home / Self Care | Payer: Medicare HMO | Source: Ambulatory Visit | Attending: Cardiovascular Disease | Admitting: Cardiovascular Disease

## 2017-12-03 DIAGNOSIS — I739 Peripheral vascular disease, unspecified: Secondary | ICD-10-CM | POA: Diagnosis not present

## 2017-12-03 NOTE — Telephone Encounter (Signed)
Joe Murray is patient ok to be in [place]?

## 2017-12-03 NOTE — Telephone Encounter (Signed)
Joe Murray,  This pt is cleared for anesthetic care at [facility].  Thanks,  Osvaldo Angst

## 2017-12-03 NOTE — Telephone Encounter (Signed)
Pt informed to hold Brillinta x 5 days prior to procedure. He verbalized understanding.

## 2017-12-07 ENCOUNTER — Encounter: Payer: Self-pay | Admitting: Internal Medicine

## 2017-12-07 ENCOUNTER — Telehealth (HOSPITAL_COMMUNITY): Payer: Self-pay

## 2017-12-07 NOTE — Telephone Encounter (Signed)
Encounter complete. 

## 2017-12-09 ENCOUNTER — Ambulatory Visit (HOSPITAL_COMMUNITY)
Admission: RE | Admit: 2017-12-09 | Discharge: 2017-12-09 | Disposition: A | Discharge status: Home / Self Care | Payer: Medicare HMO | Source: Ambulatory Visit | Attending: Cardiology | Admitting: Cardiology

## 2017-12-09 ENCOUNTER — Ambulatory Visit (INDEPENDENT_AMBULATORY_CARE_PROVIDER_SITE_OTHER): Payer: Medicare HMO | Admitting: Pharmacist Clinician (PhC)/ Clinical Pharmacy Specialist

## 2017-12-09 DIAGNOSIS — Z9861 Coronary angioplasty status: Secondary | ICD-10-CM | POA: Diagnosis not present

## 2017-12-09 DIAGNOSIS — I2510 Atherosclerotic heart disease of native coronary artery without angina pectoris: Secondary | ICD-10-CM | POA: Diagnosis not present

## 2017-12-09 DIAGNOSIS — E785 Hyperlipidemia, unspecified: Secondary | ICD-10-CM

## 2017-12-09 LAB — EXERCISE TOLERANCE TEST
CHL CUP MPHR: 143 {beats}/min
CHL CUP RESTING HR STRESS: 51 {beats}/min
CHL RATE OF PERCEIVED EXERTION: 19
CSEPEDS: 45 s
CSEPHR: 65 %
Estimated workload: 4.6 METS
Exercise duration (min): 2 min
Peak HR: 94 {beats}/min

## 2017-12-09 NOTE — Assessment & Plan Note (Signed)
Patient with CAD, previously did well with Repatha.  When insurance required switch to Praluent, patient developed allergic reaction.  Unsure if this is a product or class effect.  Will give him a sample of Repatha so we can determine allergy status.  If he does well, will put request into insurance regarding need for Repatha.  Otherwise he can consider ORION trial or wait for FDA approval of bempedoic acid in February.

## 2017-12-09 NOTE — Progress Notes (Signed)
12/09/2017 Joe Murray January 26, 1941 [id number]   HPI:  Joe Murray is a 77 y.o. male patient of Joe Murray, who presents today for a lipid clinic evaluation.  In addition to hyperlipidemia, his medical history is significant  CAD (NSTEMI 10/03 and 1/17, stents 10/03, 3/09, 7/09), chronotropic incompetence, DM2 (A1c 6.7), hypertension and intermittent claudication.    In the past he was involved in a CTEP inhibitor trial (HDL increased to 130's).  When trial concluded he was seen by our clinic and started on Repatha 140 mg.  He did well with this, and we saw an LDL drop from baseline in the 150 range to 53.  He did well with this for over a year until his insurance required a switch to Praluent.  After taking his third dose of this he developed welts across his upper chest and arms.  It was relieved with Benadryl, but then reappeared after his 4th dose, 2 weeks later.  He discontinued the medication at that time.    Current Medications:  None  Cholesterol Goals:   LDL < 70  Intolerant/previously tried:  Crestor 5 mg weekly, stopped after 2 doses because of memory problems;   ezetimibe 2006-2010 (didn't reach LDL goal),   Lescol (02/2002 - 09/2002) was d/c due to memory loss;   chart notes that prior to 2002 patient had tried both Lipitor x 2 months and Zocor x 1 month, both of which   caused myalgias Family history:   father had first MI before 70, died from second MI at 49.  2 brothers both deceased from MI's, in their late 46s  Social history:  2-2.5 cigars per day, no cigarettes at this time; occasional alcohol  Diet:   Sausage/biscuit couple of times per week; otherwise wife does all the cooking, tries to keep it healthy  Exercise:    Nothing current  Labs:   08/2017:  TC 193, TG 112, HDL 53.4, LDL 117 (no meds)  01/2017: TC 152, TG 122, HDL 62, LDL 66 (Praluent)  Current Outpatient Medications  Medication Sig Dispense Refill  . amLODipine (NORVASC) 2.5 MG tablet TAKE  1 TABLET BY MOUTH ONCE DAILY 90 tablet 2  . Cholecalciferol (VITAMIN D3) 5000 UNITS CAPS Take 5,000 Units by mouth daily. 30 capsule 0  . Cinnamon 500 MG capsule Take 2,000 mg by mouth daily.     . fish oil-omega-3 fatty acids 1000 MG capsule Take 2 g by mouth daily.      Marland Kitchen glipiZIDE (GLUCOTROL) 5 MG tablet TAKE 1 TABLET BY MOUTH TWICE DAILY BEFORE MEALS 180 tablet 1  . Lancets (BD LANCET ULTRAFINE 30G) MISC USE ONE  TO CHECK GLUCOSE ONCE DAILY 100 each 1  . MAGNESIUM CHLORIDE PO Take 1 tablet by mouth daily.      . meclizine (ANTIVERT) 25 MG tablet TAKE ONE TABLET BY MOUTH THREE TIMES DAILY AS NEEDED FOR DIZZINESS 90 tablet 0  . nitroGLYCERIN (NITROSTAT) 0.4 MG SL tablet PLACE 1 TABLET UNDER THE TONGUE EVERY 5 MINUTES FOR 3 DOSES AS NEEDED FOR CHEST PAIN 25 tablet 3  . ONE TOUCH ULTRA TEST test strip USE ONE STRIP TO CHECK GLUCOSE ONCE DAILY 100 each 1  . potassium gluconate 595 MG TABS tablet Take 595 mg by mouth.    . ticagrelor (BRILINTA) 90 MG TABS tablet Take 1 tablet (90 mg total) by mouth 2 (two) times daily. 180 tablet 3   No current facility-administered medications for this visit.     Allergies  Allergen Reactions  . Beta Adrenergic Blockers Other (See Comments)    chronotropic incompetence  . Praluent [Alirocumab]     Diffuse Rash - improved when stopping & taking Benadryl  . Sulfonamide Derivatives     unknown  . Statins Other (See Comments)    REACTION: myalgias    Past Medical History:  Diagnosis Date  . CAD S/P percutaneous coronary angioplasty 10/'03; 3/'09; 7/*09; 1/'17    a. Joe. Ellyn Murray; '03 - Cypher DES 3.0 mm 33 mm proximal-mid LAD (details D1 with ostial 60%); 3/'09 dRCA 2.75 mm x 13 mm Cypher DES (2.8 mm); 7/'09 pRCA 3.0 mm x 18 m Cypher DES (3.25 mm);; (2009) 2D Echo - EF 55%, (November 2014) nonischemic Myoview;; b. Promus DES to RCA and mid LAD 02/26/2015  . Chronotropic incompetence 12/2011   For Exertional Dyspnea:  MET Test - EKG-sinus bradycardia, no  arrhythmia/ectopy;; treadmill stress test portion of Myoview: Able to achieve 85% maximum heart rate.  . Diabetes mellitus type 2 in obese (HCC)    CAD  . Diverticulosis   . Dyslipidemia, goal LDL below 70    on PharmQuest study medicaion.  . ED (erectile dysfunction)   . GERD (gastroesophageal reflux disease)   . Hemorrhoids 07/2002   Internal and External  . Insomnia   . Non-STEMI (non-ST elevated myocardial infarction) ([place]) 11/2001   a. Proximal LAD tandem lesions -- long DES stent covering both;; b. Jan 2017: mRCA PCI, [place] PCI  . Persistent sinus bradycardia   . Personal history of colonic adenomas 09/06/2012  . Psoriasis    Resting HR in 54s  . Spasm of esophagus   . SVT (supraventricular tachycardia) ([place]) 2003  . Vertigo, benign positional     There were no vitals taken for this visit.   Dyslipidemia, goal LDL below 70 Patient with CAD, previously did well with Repatha.  When insurance required switch to Praluent, patient developed allergic reaction.  Unsure if this is a product or class effect.  Will give him a sample of Repatha so we can determine allergy status.  If he does well, will put request into insurance regarding need for Repatha.  Otherwise he can consider ORION trial or wait for FDA approval of bempedoic acid in February.     Tommy Medal PharmD CPP [facility]

## 2017-12-10 ENCOUNTER — Telehealth: Payer: Self-pay | Admitting: *Deleted

## 2017-12-10 MED ORDER — ISOSORBIDE MONONITRATE ER 30 MG PO TB24: 30.0000 mg | ORAL_TABLET | Freq: Every day | ORAL | 6 refills | Status: DC

## 2017-12-10 NOTE — Telephone Encounter (Signed)
-----   Message from Leonie Man, MD sent at 12/09/2017  5:16 PM EDT ----- Study does show reduced exercise capacity -I am not sure that this meets criteria for permanent pacemaker placement. One last day we can do to try to see if this helps will be to stop amlodipine.  I would like to replace that with Imdur 30 mg nightly (prescribed 90 tablets, 4 refills).. If he has worsening anginal symptoms, would go back on amlodipine and stop Imdur.  Will forward this to EP docs to see when they would consider pacemaker placement for chronotropic incompetence.  I do not think getting her heart rate to 94 bpm is significant enough chronotropic incompetence.   Glenetta Hew, MD  Will -what your thoughts on this.  He has a lot of fatigue, and the only real cardiac reason for it would be chronotropic incompetence.  He does have resting bradycardia and has not been on a beta-blocker or AV nodal agent for a long time. I just wanted to see what you thought about how much he would benefit from pacemaker placement for chronotropic incompetence.

## 2017-12-10 NOTE — Telephone Encounter (Signed)
The patient's wife has been notified of the result and verbalized understanding.  All questions (if any) were answered. Aware will contact with further instruction if needed   E-sent prescription  30 day supply per wife. Raiford Simmonds, RN 12/10/2017 2:24 PM

## 2017-12-13 NOTE — Progress Notes (Signed)
Discussed GXT results with EP -- with ability to increase HR to > 90 bpm & resting HR of 58 bpm, this does not meet requirements for Pacemaker.  EP does not think that it would provide much help.    Glenetta Hew, MD

## 2017-12-14 ENCOUNTER — Telehealth: Payer: Self-pay | Admitting: Cardiology

## 2017-12-14 NOTE — Telephone Encounter (Signed)
New Message:      Please call wife, she wants to know why Dr Ellyn Hack called in Nitroglycerin for the patient? She said the pt have not had any chest pains.

## 2017-12-14 NOTE — Telephone Encounter (Signed)
Called patient, she states that she was told for her husband to take nitroglycerin daily, I advised patient that is not a daily medication, only as needed. Patient wife was given instructions from previous note from [place], [place] and patient wife verbalized understanding to start imdur and stop amlodipine.

## 2017-12-16 ENCOUNTER — Ambulatory Visit (AMBULATORY_SURGERY_CENTER): Payer: Medicare HMO | Admitting: Internal Medicine

## 2017-12-16 ENCOUNTER — Encounter: Payer: Self-pay | Admitting: Internal Medicine

## 2017-12-16 ENCOUNTER — Telehealth: Payer: Self-pay | Admitting: *Deleted

## 2017-12-16 VITALS — BP 129/56 | HR 46 | Temp 99.3°F | Resp 15 | Ht 69.0 in | Wt 202.0 lb

## 2017-12-16 DIAGNOSIS — Z86010 Personal history of colonic polyps: Secondary | ICD-10-CM | POA: Diagnosis not present

## 2017-12-16 DIAGNOSIS — D123 Benign neoplasm of transverse colon: Secondary | ICD-10-CM | POA: Diagnosis not present

## 2017-12-16 DIAGNOSIS — K635 Polyp of colon: Secondary | ICD-10-CM | POA: Diagnosis not present

## 2017-12-16 DIAGNOSIS — Z1211 Encounter for screening for malignant neoplasm of colon: Secondary | ICD-10-CM | POA: Diagnosis not present

## 2017-12-16 DIAGNOSIS — K5900 Constipation, unspecified: Secondary | ICD-10-CM | POA: Diagnosis not present

## 2017-12-16 HISTORY — PX: COLONOSCOPY: SHX174

## 2017-12-16 MED ORDER — SODIUM CHLORIDE 0.9 % IV SOLN: 500.0000 mL | Freq: Once | INTRAVENOUS | Status: DC

## 2017-12-16 NOTE — Op Note (Signed)
[place] Patient Name: Joe Murray Procedure Date: 12/16/2017 8:05 AM MRN: [id number] Endoscopist: Gatha Mayer , MD Age: 77 Referring MD:  Date of Birth: 04/03/1940 Gender: Male Account #: [id number] Procedure:                Colonoscopy Indications:              Surveillance: Personal history of adenomatous                            polyps on last colonoscopy 5 years ago Medicines:                Propofol per Anesthesia, Monitored Anesthesia Care Procedure:                Pre-Anesthesia Assessment:                           - Prior to the procedure, a History and Physical                            was performed, and patient medications and                            allergies were reviewed. The patient's tolerance of                            previous anesthesia was also reviewed. The risks                            and benefits of the procedure and the sedation                            options and risks were discussed with the patient.                            All questions were answered, and informed consent                            was obtained. Prior Anticoagulants: The patient                            last took antiplatelet medication 5 days prior to                            the procedure. ASA Grade Assessment: III - A                            patient with severe systemic disease. After                            reviewing the risks and benefits, the patient was                            deemed in satisfactory condition to undergo the  procedure.                           After obtaining informed consent, the colonoscope                            was passed under direct vision. Throughout the                            procedure, the patient's blood pressure, pulse, and                            oxygen saturations were monitored continuously. The                            Colonoscope was introduced through the  anus and                            advanced to the the cecum, identified by                            appendiceal orifice and ileocecal valve. The                            colonoscopy was performed without difficulty. The                            patient tolerated the procedure well. The quality                            of the bowel preparation was good. The ileocecal                            valve, appendiceal orifice, and rectum were                            photographed. The bowel preparation used was                            Miralax. Scope In: 8:13:06 AM Scope Out: 8:29:51 AM Scope Withdrawal Time: 0 hours 14 minutes 39 seconds  Total Procedure Duration: 0 hours 16 minutes 45 seconds  Findings:                 The perianal examination was normal.                           The digital rectal exam findings include decreased                            sphincter tone. Pertinent negatives include normal                            prostate (size, shape, and consistency).  Two sessile polyps were found in the transverse                            colon. The polyps were diminutive in size. These                            polyps were removed with a cold snare. Resection                            and retrieval were complete. Verification of                            patient identification for the specimen was done.                            Estimated blood loss was minimal.                           Multiple diverticula were found in the entire colon.                           The exam was otherwise without abnormality on                            direct and retroflexion views. Complications:            No immediate complications. Estimated Blood Loss:     Estimated blood loss was minimal. Impression:               - Decreased sphincter tone found on digital rectal                            exam.                           - Two diminutive polyps in  the transverse colon,                            removed with a cold snare. Resected and retrieved.                           - Diverticulosis in the entire examined colon.                           - The examination was otherwise normal on direct                            and retroflexion views.                           - Personal history of colonic polyps - adenomas                            2014. Recommendation:           - Patient has a contact number available  for                            emergencies. The signs and symptoms of potential                            delayed complications were discussed with the                            patient. Return to normal activities tomorrow.                            Written discharge instructions were provided to the                            patient.                           - Continue present medications.                           - Resume Brilinta at prior dose tomorrow.                           - No repeat colonoscopy due to current age (74                            years or older). Gatha Mayer, MD 12/16/2017 8:40:20 AM This report has been signed electronically.

## 2017-12-16 NOTE — Progress Notes (Signed)
PT taken to PACU. Monitors in place. VSS. Report given to RN. 

## 2017-12-16 NOTE — Progress Notes (Signed)
Called to room to assist during endoscopic procedure.  Patient ID and intended procedure confirmed with present staff. Received instructions for my participation in the procedure from the performing physician.  

## 2017-12-16 NOTE — Patient Instructions (Addendum)
I found and removed 2 small polyps.  You also have a condition called diverticulosis - common and not usually a problem. Please read the handout provided.  Restart Brilinta tomorrow.  I will not recommend another routine colonoscopy.  I appreciate the opportunity to care for you. Gatha Mayer, MD, [facility]  Handouts Given: Polyps and DiverticulosisYOU HAD AN ENDOSCOPIC PROCEDURE TODAY AT [facility]:   Refer to the procedure report that was given to you for any specific questions about what was found during the examination.  If the procedure report does not answer your questions, please call your gastroenterologist to clarify.  If you requested that your care partner not be given the details of your procedure findings, then the procedure report has been included in a sealed envelope for you to review at your convenience later.  YOU SHOULD EXPECT: Some feelings of bloating in the abdomen. Passage of more gas than usual.  Walking can help get rid of the air that was put into your GI tract during the procedure and reduce the bloating. If you had a lower endoscopy (such as a colonoscopy or flexible sigmoidoscopy) you may notice spotting of blood in your stool or on the toilet paper. If you underwent a bowel prep for your procedure, you may not have a normal bowel movement for a few days.  Please Note:  You might notice some irritation and congestion in your nose or some drainage.  This is from the oxygen used during your procedure.  There is no need for concern and it should clear up in a day or so.  SYMPTOMS TO REPORT IMMEDIATELY:   Following lower endoscopy (colonoscopy or flexible sigmoidoscopy):  Excessive amounts of blood in the stool  Significant tenderness or worsening of abdominal pains  Swelling of the abdomen that is new, acute  Fever of 100F or higher   For urgent or emergent issues, a gastroenterologist can be reached at any hour by calling [phone]  [phone].   DIET:  We do recommend a small meal at first, but then you may proceed to your regular diet.  Drink plenty of fluids but you should avoid alcoholic beverages for 24 hours.  ACTIVITY:  You should plan to take it easy for the rest of today and you should NOT DRIVE or use heavy machinery until tomorrow (because of the sedation medicines used during the test).    FOLLOW UP: Our staff will call the number listed on your records the next business day following your procedure to check on you and address any questions or concerns that you may have regarding the information given to you following your procedure. If we do not reach you, we will leave a message.  However, if you are feeling well and you are not experiencing any problems, there is no need to return our call.  We will assume that you have returned to your regular daily activities without incident.  If any biopsies were taken you will be contacted by phone or by letter within the next 1-3 weeks.  Please call us at [phone] if you have not heard about the biopsies in 3 weeks.    SIGNATURES/CONFIDENTIALITY: You and/or your care partner have signed paperwork which will be entered into your electronic medical record.  These signatures attest to the fact that that the information above on your After Visit Summary has been reviewed and is understood.  Full responsibility of the confidentiality of this discharge information lies with you  and/or your care-partner. 

## 2017-12-17 ENCOUNTER — Telehealth: Payer: Self-pay | Admitting: *Deleted

## 2017-12-17 NOTE — Telephone Encounter (Signed)
  Follow up Call-  Call back number 12/16/2017  Post procedure Call Back phone  # [phone]  Permission to leave phone message Yes  Some recent data might be hidden     Patient questions:  Do you have a fever, pain , or abdominal swelling? No. Pain Score  0 *  Have you tolerated food without any problems? Yes.    Have you been able to return to your normal activities? No.  Do you have any questions about your discharge instructions: Diet   No. Medications  No. Follow up visit  No.  Do you have questions or concerns about your Care? No.  Actions: * If pain score is 4 or above: No action needed, pain <4.

## 2017-12-19 ENCOUNTER — Other Ambulatory Visit: Payer: Self-pay | Admitting: Pharmacist Clinician (PhC)/ Clinical Pharmacy Specialist

## 2017-12-19 ENCOUNTER — Telehealth: Payer: Self-pay | Admitting: Pharmacist Clinician (PhC)/ Clinical Pharmacy Specialist

## 2017-12-19 DIAGNOSIS — E785 Hyperlipidemia, unspecified: Secondary | ICD-10-CM

## 2017-12-19 DIAGNOSIS — R69 Illness, unspecified: Secondary | ICD-10-CM | POA: Diagnosis not present

## 2017-12-19 NOTE — Telephone Encounter (Signed)
Labs to continue Repatha

## 2017-12-28 ENCOUNTER — Encounter: Payer: Self-pay | Admitting: Internal Medicine

## 2017-12-28 DIAGNOSIS — Z86010 Personal history of colonic polyps: Secondary | ICD-10-CM

## 2017-12-28 NOTE — Progress Notes (Signed)
2 dimin ssp's No recall age

## 2018-01-10 ENCOUNTER — Ambulatory Visit (INDEPENDENT_AMBULATORY_CARE_PROVIDER_SITE_OTHER): Payer: Medicare HMO

## 2018-01-10 ENCOUNTER — Encounter: Payer: Self-pay | Admitting: Family Medicine

## 2018-01-10 ENCOUNTER — Encounter: Payer: Self-pay | Admitting: Neurology

## 2018-01-10 ENCOUNTER — Ambulatory Visit (INDEPENDENT_AMBULATORY_CARE_PROVIDER_SITE_OTHER): Payer: Medicare HMO | Admitting: Family Medicine

## 2018-01-10 VITALS — BP 122/78 | HR 51 | Temp 98.3°F | Wt 208.2 lb

## 2018-01-10 DIAGNOSIS — R0602 Shortness of breath: Secondary | ICD-10-CM | POA: Diagnosis not present

## 2018-01-10 DIAGNOSIS — M542 Cervicalgia: Secondary | ICD-10-CM

## 2018-01-10 DIAGNOSIS — R413 Other amnesia: Secondary | ICD-10-CM

## 2018-01-10 DIAGNOSIS — R05 Cough: Secondary | ICD-10-CM | POA: Diagnosis not present

## 2018-01-10 DIAGNOSIS — I10 Essential (primary) hypertension: Secondary | ICD-10-CM

## 2018-01-10 DIAGNOSIS — I2510 Atherosclerotic heart disease of native coronary artery without angina pectoris: Secondary | ICD-10-CM

## 2018-01-10 DIAGNOSIS — E118 Type 2 diabetes mellitus with unspecified complications: Secondary | ICD-10-CM | POA: Diagnosis not present

## 2018-01-10 LAB — HEMOGLOBIN A1C: HEMOGLOBIN A1C: 6.5 % (ref 4.6–6.5)

## 2018-01-10 LAB — HEPATIC FUNCTION PANEL
ALT: 13 U/L (ref 0–53)
AST: 14 U/L (ref 0–37)
Albumin: 4.1 g/dL (ref 3.5–5.2)
Alkaline Phosphatase: 43 U/L (ref 39–117)
BILIRUBIN TOTAL: 0.5 mg/dL (ref 0.2–1.2)
Bilirubin, Direct: 0.1 mg/dL (ref 0.0–0.3)
Total Protein: 6.4 g/dL (ref 6.0–8.3)

## 2018-01-10 LAB — LIPID PANEL
CHOL/HDL RATIO: 4
Cholesterol: 206 mg/dL — ABNORMAL HIGH (ref 0–200)
HDL: 53.9 mg/dL (ref >39.00)
LDL Cholesterol: 113 mg/dL — ABNORMAL HIGH (ref 0–99)
NonHDL: 152.18
Triglycerides: 196 mg/dL — ABNORMAL HIGH (ref 0.0–149.0)
VLDL: 39.2 mg/dL (ref 0.0–40.0)

## 2018-01-10 MED ORDER — METHOCARBAMOL 500 MG PO TABS: 500.0000 mg | ORAL_TABLET | Freq: Four times a day (QID) | ORAL | 2 refills | Status: DC | PRN

## 2018-01-10 MED ORDER — GLIPIZIDE 5 MG PO TABS: 5.0000 mg | ORAL_TABLET | Freq: Two times a day (BID) | ORAL | 3 refills | Status: DC

## 2018-01-10 NOTE — Progress Notes (Signed)
   Subjective:    Patient ID: Joe Murray, male    DOB: 1940/08/18, 77 y.o.   MRN: [id number]  HPI Here with his wife for a number of issues. First he has had SOB on exertion (without chest pain) and Dr. Ellyn Hack sent him for a stress test on 12-09-17. This showed reduced exercise tolerance but had no indications for heart disease. He had ABI's done on both legs on 12-03-17 which were normal, but he still complains of weakness and pain in the legs. He also complains of 6 months of daily headaches which are mil din the mornings and get worse in the evenings. These are centered in the left posterior neck at the base of the skull. Tylenol helps the pain. No vision changes. Also he needs his to have his lipids tested to continue to qualify fore the program that supplies his Repatha. Lastly his wife says his memory has been getting worse and he seems to get confused at times about things like whether a bill has been paid or about trips they might be planning, etc. She is concerned about early possible dementia.   Review of Systems  Constitutional: Positive for fatigue.  Respiratory: Positive for shortness of breath.   Cardiovascular: Negative.   Musculoskeletal: Positive for neck pain.  Neurological: Positive for headaches.  Psychiatric/Behavioral: Positive for confusion. Negative for agitation, dysphoric mood and hallucinations. The patient is not nervous/anxious.        Objective:   Physical Exam  Constitutional: He is oriented to person, place, and time. He appears well-developed and well-nourished.  Neck: No thyromegaly present.  Cardiovascular: Normal rate, regular rhythm, normal heart sounds and intact distal pulses.  Pulmonary/Chest: Effort normal. No stridor. No respiratory distress. He has no wheezes. He has no rales.  He has fine crackles at both bases   Musculoskeletal:  His neck shows reduced ROM and crepitus, but is not tender   Lymphadenopathy:    He has no cervical  adenopathy.  Neurological: He is alert and oriented to person, place, and time.          Assessment & Plan:  For the SOB on exertion, we will get a CXR today. His headaches seem to be coming from a pinched nerve in the neck. He may try Robaxin along with the Tylenol and moist heat. Get Xrays of the cervical spine today. Get labs for fasting lipids and an A1c. Refer to Neurology for memory loss.  Alysia Penna, MD

## 2018-01-10 NOTE — Addendum Note (Signed)
Addended by: Gwynne Edinger on: 01/10/2018 10:54 AM   Modules accepted: Orders

## 2018-01-14 ENCOUNTER — Other Ambulatory Visit: Payer: Self-pay | Admitting: Pharmacist Clinician (PhC)/ Clinical Pharmacy Specialist

## 2018-01-14 ENCOUNTER — Telehealth: Payer: Self-pay | Admitting: Pharmacist Clinician (PhC)/ Clinical Pharmacy Specialist

## 2018-01-14 DIAGNOSIS — E785 Hyperlipidemia, unspecified: Secondary | ICD-10-CM

## 2018-01-14 NOTE — Telephone Encounter (Signed)
Patient had previously been on Repatha and done well.  Then in 2019 his insurance required switch to Praluent.  Unfortunately he developed welts across his chest with this that returned after subsequent doses.  He was off medication for several months and we recently got approval from his insurance to switch back to [place].  Unfortunately labs were drawn just a few days after his first dose and do not reflect the positive results he has seen previously.  We will have him take 2 more doses over the next month and repeat labs in mid-December.  This will allow [place] to get a continued PA into 2020.

## 2018-01-14 NOTE — Telephone Encounter (Signed)
OPEN ERROR

## 2018-01-18 ENCOUNTER — Telehealth: Payer: Self-pay

## 2018-01-18 NOTE — Telephone Encounter (Signed)
Received a fax for PA on Methocarbamol 500mg . PA has been sent to cover my meds.  Key: [id number]

## 2018-01-25 NOTE — Telephone Encounter (Signed)
PA has been approved as on Nov 26. LM on home voicemail informing patient

## 2018-02-04 ENCOUNTER — Encounter: Payer: Self-pay | Admitting: Cardiology

## 2018-02-04 ENCOUNTER — Ambulatory Visit: Payer: Medicare HMO | Admitting: Cardiology

## 2018-02-04 VITALS — BP 122/70 | HR 58 | Ht 69.0 in | Wt 208.0 lb

## 2018-02-04 DIAGNOSIS — Z9861 Coronary angioplasty status: Secondary | ICD-10-CM

## 2018-02-04 DIAGNOSIS — E118 Type 2 diabetes mellitus with unspecified complications: Secondary | ICD-10-CM

## 2018-02-04 DIAGNOSIS — I10 Essential (primary) hypertension: Secondary | ICD-10-CM

## 2018-02-04 DIAGNOSIS — R5383 Other fatigue: Secondary | ICD-10-CM

## 2018-02-04 DIAGNOSIS — I4589 Other specified conduction disorders: Secondary | ICD-10-CM

## 2018-02-04 DIAGNOSIS — I2510 Atherosclerotic heart disease of native coronary artery without angina pectoris: Secondary | ICD-10-CM | POA: Diagnosis not present

## 2018-02-04 DIAGNOSIS — E785 Hyperlipidemia, unspecified: Secondary | ICD-10-CM | POA: Diagnosis not present

## 2018-02-04 MED ORDER — ASPIRIN EC 81 MG PO TBEC: 81.0000 mg | DELAYED_RELEASE_TABLET | Freq: Every day | ORAL | 3 refills | Status: DC

## 2018-02-04 NOTE — Patient Instructions (Signed)
Medication Instructions:    STOP TAKING BRILINTA AFTER CURRENT BOTTLE IS EMPTY   THE START TAKING ASPIRIN 81 MG ONE TABLET DAILY   STOP TAKING AMLODIPINE STOP TAKING IMDUR (iSOSORBIDE MONO)  WILL DISCUSS REPATHA T NEXT FOLLOW UP VISIT  If you need a refill on your cardiac medications before your next appointment, please call your pharmacy.   Lab work: NOT NEEDED If you have labs (blood work) drawn today and your tests are completely normal, you will receive your results only by: Marland Kitchen MyChart Message (if you have MyChart) OR . A paper copy in the mail If you have any lab test that is abnormal or we need to change your treatment, we will call you to review the results.  Testing/Procedures: NOT NEEDED  Follow-Up: At [facility], you and your health needs are our priority.  As part of our continuing mission to provide you with exceptional heart care, we have created designated Provider Care Teams.  These Care Teams include your primary Cardiologist (physician) and Advanced Practice Providers (APPs -  Physician Assistants and Nurse Practitioners) who all work together to provide you with the care you need, when you need it. . Your physician recommends that you schedule a follow-up appointment in MARCH 2020 [place] .  Any Other Special Instructions Will Be Listed Below (If Applicable).

## 2018-02-04 NOTE — Progress Notes (Signed)
PCP: Laurey Morale, MD  Clinic Note: Chief Complaint  Patient presents with  . Follow-up    1.  Medications  . Shortness of Breath    Walking far.    HPI: Joe Murray is a 77 y.o. male with a PMH of CAD-PCI & HTN/HLD/DM-2 who who presents today along with his wife to discuss the possibility stopping medications.  CAD: Non-STEMI-PCI..-- s/p DES to mid RCA and DES to mid LAD on 02/26/2015--> currently enrolled in Twilight study that is about to stop --  Also Seen by Tommy Medal in Lipid clinic - now currently on PCSK-9 Inhibitor evaluation.   NIMAI BURBACH was last seen on October 9.  Was noticing fatigue walking about 100 yards.  Still is feeling tired with no exercise tolerance.  I ordered a ETT to reevaluate for chronotropic incompetence (see below).  Plan was to convert from amlodipine to Imdur.  Unfortunately he took 1 dose of Imdur and had a headache so he did not take it anymore.  He also stopped taking amlodipine about a week ago.  He has not used meclizine at all.  Recent Hospitalizations:   none  Studies Reviewed:   ETT/GXT (to evaluate chronotropic competence) December 09, 2017: Normal blood pressure response.  No EKG changes.  Exercise stopped due to fatigue and dyspnea.  Heart rate increased to low 90s from 50s.  Suggest chronotropic incompetence.  Severely impaired exercise capacity. -->  Results reviewed with electrophysiology: Still not indication for pacemaker.   Interval History: [place] returns today with his wife pretty much exasperated by the fact that he just has no energy.  He cannot really do much of anything without getting short of breath.  Really can only walk about 100 feet not yards.  He has to stop because of dyspnea.  He wants to just see if he can stop all his medicines.  Apparently he is to be referred to neurology for evaluation of dementia.  He is a very poor historian now forgets most things, and is very slow responding with answers.  He does  seem to be continuing to be slower and slower, see him. Now despite having exertional dyspnea and fatigue, he is not noticing any chest pain or pressure with rest or exertion.  He does have some mild lower extremity edema, but really does not have any PND orthopnea.  He has a little bit of a left hand tremor that is noticed today.  His wife has noticed it but nobody else has.  He has not had any rapid heartbeats palpitations. No syncope/near syncope or TIA/amorous fugax.  Other Cardiovascular ROS: positive for - Fatigue and exertional dyspnea. negative for - chest pain, edema, irregular heartbeat, murmur, orthopnea, palpitations, paroxysmal nocturnal dyspnea, rapid heart rate or syncope/near syncope, TIA/amaurosis fugax; claudication.   Smoking: -- still smokes ~2 cigars/day --> no intention to quit.  ROS: A comprehensive was performed. Review of Systems  Constitutional: Positive for malaise/fatigue (Energy level remains down.  No change in medicine changes.).  HENT: Positive for hearing loss. Negative for congestion and nosebleeds.        Morning rhinorrhea  Eyes:       - R eye vision notably improved -- post corneal transplant  Respiratory: Positive for shortness of breath (With exertion). Negative for cough, hemoptysis and wheezing.   Gastrointestinal: Negative for blood in stool and melena.  Genitourinary: Negative for hematuria.  Musculoskeletal: Positive for back pain and joint pain (Knees - but mostly  the R hip).  Neurological: Positive for dizziness (Notably improved since cutting back on his diabetes medication). Negative for focal weakness.  Endo/Heme/Allergies: Negative for environmental allergies. Does not bruise/bleed easily.  Psychiatric/Behavioral: Positive for memory loss. Negative for depression. The patient is not nervous/anxious and does not have insomnia.        Not sure if it is depression or just slowed mentation.  All other systems reviewed and are  negative.   Past Medical History:  Diagnosis Date  . CAD S/P percutaneous coronary angioplasty 10/'03; 3/'09; 7/*09; 1/'17    a. Dr. Ellyn Hack; '03 - Cypher DES 3.0 mm 33 mm proximal-mid LAD (details D1 with ostial 60%); 3/'09 dRCA 2.75 mm x 13 mm Cypher DES (2.8 mm); 7/'09 pRCA 3.0 mm x 18 m Cypher DES (3.25 mm);; (2009) 2D Echo - EF 55%, (November 2014) nonischemic Myoview;; b. Promus DES to RCA and mid LAD 02/26/2015  . Chronotropic incompetence 12/2011   ETT 11/2017: Normal blood pressure response.  No EKG changes.  Exercise stopped due to fatigue and dyspnea.  Heart rate increased to low 90s from 50s.  Suggest chronotropic incompetence.  Severely impaired exercise capacity. -->  Results reviewed with electrophysiology: Still not indication for pacemaker.  . Diabetes mellitus type 2 in obese (HCC)    CAD  . Diverticulosis   . Dyslipidemia, goal LDL below 70    on [employer] study medicaion.  . ED (erectile dysfunction)   . GERD (gastroesophageal reflux disease)   . Hemorrhoids 07/2002   Internal and External  . Insomnia   . Non-STEMI (non-ST elevated myocardial infarction) ([facility]) 11/2001   a. Proximal LAD tandem lesions -- long DES stent covering both;; b. Jan 2017: mRCA PCI, Becker PCI  . Persistent sinus bradycardia   . Personal history of colonic adenomas 09/06/2012  . Psoriasis    Resting HR in 54s  . Spasm of esophagus   . SVT (supraventricular tachycardia) ([facility]) 2003  . Vertigo, benign positional     Past Surgical History:  Procedure Laterality Date  . CARDIAC CATHETERIZATION N/A 02/26/2015   Procedure: Left Heart Cath and Coronary Angiography;  Surgeon: Leonie Man, MD: mRCA 99% --> PCI. mLAD 75%-> FFR Guided PCI, Mod ISR in pLAD & pRCA  . CARDIAC CATHETERIZATION N/A 02/26/2015   Procedure: Coronary Stent Intervention;  Surgeon: Leonie Man, MD;  Location: [facility]: mRCA Promus Premier DES 3.0 x 12 (3.5), mLAD Promus Premier DES 2.75 x 20 (3.0)  . CARDIAC  CATHETERIZATION N/A 02/26/2015   Procedure: Intravascular Pressure Wire/FFR Study;  Surgeon: Leonie Man, MD;  Location: [facility] [place] [facility] [facility];  Service: Cardiovascular: LAD 75% - FFR + -> PCI  . CATARACT EXTRACTION  [id number]  . COLONOSCOPY  08-31-12   per Dr. Carlean Purl, adenomatous polyps, repeat in 3 years   . CORONARY ANGIOPLASTY WITH STENT PLACEMENT  LAD - 2003, RCA 3 & 7/ '09   Cypher 3.0 x 32 mid LAD; Cyper 2.75 x 13 - distal RCA, 3.0 x 18 Prox RCA  . ESOPHAGOGASTRODUODENOSCOPY  08-11-02   esophageal dilation per Dr. Carlean Purl  . HERNIA REPAIR     right inguinal   . NM MYOVIEW LTD  Nov 2014   ~8 METS; EF 60%, no ischemia or infarction  . PERCUTANEOUS CORONARY STENT INTERVENTION (PCI-S)  11/23/2001   NSTEMI: Prox-Mid LAD tandem ~80% lesions on either side of D1 (with 80% lesion) -- Cypher DES 3.0 mm x 33 m (covering both lesions)   .  PERCUTANEOUS CORONARY STENT INTERVENTION (PCI-S)  04/27/2007   Unstable Angina: Distal RCA 95%: Cypher 2.75x13  (2.8 mm); residual focal ~60%ISR in LAD stent, 60% D1 ostial (no PTCA on LAD due to no ischemia on ST)  . PERCUTANEOUS CORONARY STENT INTERVENTION (PCI-S)  09/21/2007   Bradycardia & Unstable Angina: Prox RCA 70% - PCI Cypher DES 3.0 mm x 18 mm  (3.25 mm); ostial 60-70% jailed D1. LAD & distal RCA stents patent  . SHOULDER SURGERY     left rotator cuff, Dr. Gladstone Lighter  . TRANSTHORACIC ECHOCARDIOGRAM  March 2009   Normal LV size and function, EF 55%. Mild MR; mild RV dilation.  . TRANSTHORACIC ECHOCARDIOGRAM  02/2015   EF 60-65%. Moderate concentric LVH. Normal function with normal regional wall motion. GR 1 DD  . WRIST FRACTURE SURGERY     right    Current Meds  Medication Sig  . Cholecalciferol (VITAMIN D3) 5000 UNITS CAPS Take 5,000 Units by mouth daily.  . Cinnamon 500 MG capsule Take 2,000 mg by mouth daily.   . fish oil-omega-3 fatty acids 1000 MG capsule Take 2 g by mouth daily.    Marland Kitchen glipiZIDE (GLUCOTROL) 5 MG tablet Take 1 tablet (5 mg total) by  mouth 2 (two) times daily before a meal.  . Lancets (BD LANCET ULTRAFINE 30G) MISC USE ONE  TO CHECK GLUCOSE ONCE DAILY  . MAGNESIUM CHLORIDE PO Take 1 tablet by mouth daily.    . meclizine (ANTIVERT) 25 MG tablet TAKE ONE TABLET BY MOUTH THREE TIMES DAILY AS NEEDED FOR DIZZINESS  . methocarbamol (ROBAXIN) 500 MG tablet Take 1 tablet (500 mg total) by mouth every 6 (six) hours as needed (neck pain).  . nitroGLYCERIN (NITROSTAT) 0.4 MG SL tablet PLACE 1 TABLET UNDER THE TONGUE EVERY 5 MINUTES FOR 3 DOSES AS NEEDED FOR CHEST PAIN  . ONE TOUCH ULTRA TEST test strip USE ONE STRIP TO CHECK GLUCOSE ONCE DAILY  . potassium gluconate 595 MG TABS tablet Take 595 mg by mouth.  . [DISCONTINUED] amLODipine (NORVASC) 2.5 MG tablet TAKE 1 TABLET BY MOUTH ONCE DAILY  . [DISCONTINUED] ticagrelor (BRILINTA) 90 MG TABS tablet Take 1 tablet (90 mg total) by mouth 2 (two) times daily.    Allergies  Allergen Reactions  . Beta Adrenergic Blockers Other (See Comments)    chronotropic incompetence  . Praluent [Alirocumab]     Diffuse Rash - improved when stopping & taking Benadryl  . Sulfonamide Derivatives     unknown  . Statins Other (See Comments)    REACTION: myalgias    Social History   Tobacco Use  . Smoking status: Current Every Day Smoker    Types: Cigars    Start date: 07/06/1952  . Smokeless tobacco: Never Used  . Tobacco comment: 1-2 per day; he says "I really don't inhale"  Substance Use Topics  . Alcohol use: Yes    Alcohol/week: 0.0 standard drinks    Comment: occ  . Drug use: No   Social History   Social History Narrative   He is a married, father of 82, grandfather 3.   He still smokes 2-3 cigars per day. States that he "does not really inhale ". He is not really all that it's including, stating that he wants to have his 1 remaining vice.   Exercises only on occasion.   He does various landscaping jobs including cutting wood, and clearing brush.     family history includes  Dementia in his father; Diabetes in his  mother; Hearing loss in his father; Heart attack in his brother; Heart attack (age of onset: 64) in his father.  Wt Readings from Last 3 Encounters:  02/04/18 208 lb (94.3 kg)  01/10/18 208 lb 4 oz (94.5 kg)  12/16/17 202 lb (91.6 kg)  -- No further wgt loss- diet adjustment (lower portions) & increased activity.  PHYSICAL EXAM BP 122/70 (BP Location: Left Arm, Patient Position: Sitting, Cuff Size: Normal)   Pulse (!) 58   Ht 5\' 9"  (1.753 m)   Wt 208 lb (94.3 kg)   BMI 30.72 kg/m   Physical Exam  Constitutional: He is oriented to person, place, and time. He appears well-developed and well-nourished. No distress.  Well-groomed.  Healthy-appearing  HENT:  Head: Normocephalic and atraumatic.  Very hard of hearing.  Neck: Normal range of motion. Neck supple. No hepatojugular reflux and no JVD present. Carotid bruit is not present.  Cardiovascular: Regular rhythm, normal heart sounds, intact distal pulses and normal pulses.  Occasional extrasystoles are present. Bradycardia present. PMI is not displaced. Exam reveals no gallop and no friction rub.  No murmur heard. Pulmonary/Chest: Effort normal and breath sounds normal. No respiratory distress. He has no wheezes. He has no rales.  Abdominal: Soft. Bowel sounds are normal. He exhibits no distension. There is no abdominal tenderness. There is no rebound.  Musculoskeletal: Normal range of motion.        General: No edema.  Neurological: He is alert and oriented to person, place, and time.  Psychiatric: He has a normal mood and affect. His behavior is normal. Judgment normal.  Does seem to have persistently slowed mentation, slow to answer questions.  Has a hard to remembering things.  Seems to think hard to answer questions but not always able to.  Nursing note and vitals reviewed.    Adult ECG Report Not checked  Other studies Reviewed: Additional studies/ records that were reviewed today  include:  Recent Labs:    Lab Results  Component Value Date   CHOL 206 (H) 01/10/2018   HDL 53.90 01/10/2018   LDLCALC 113 (H) 01/10/2018   LDLDIRECT 85.5 06/27/2012   TRIG 196.0 (H) 01/10/2018   CHOLHDL 4 01/10/2018    ASSESSMENT / PLAN:  Today's visit was pretty much related to frustration driven concerns.  He clearly is having signs of progressive dementia and just slow decline.  There is definitely some component of chronotropic competence, but EP did not feel like she was a candidate for pacemaker.    His blood pressure looks good he has not been taking his amlodipine or Imdur.  Since he did not tolerate either 1 of those, amorous simply just agree with having him stop. He is far enough out from his last PCI that he can stop Brilinta.  We will have him go back to just taking daily aspirin. --With concerns of chronotropic S, clearly would not put him on a beta-blocker and will stop his amlodipine.  He is currently taking Repatha and is due for an injection soon and to get his medications renewed.  (We discussed the possibility of a trial off of this for little bit to see if it changes symptoms, but for now I think we would keep him on it).  Despite having fatigue, he is not having any anginal symptoms or heart failure symptoms.  Is euvolemic on exam.  He has no intention of smoking cessation.He has no intention of smoking cessation.  Plan for now is to simply remove most  of his medications to see if there is any adverse effect of the medications.  He is not talked to his PCP about changing his diabetes meds as well.  They are concerned that his glipizide could be contributing to possible dementia.  At this point I think we will just simply stay off medications and allow for quality of life   Problem List Items Addressed This Visit    CAD S/P percutaneous coronary angioplasty - LADx2, & RCA x 3 - Primary (Chronic)   Relevant Medications   aspirin EC 81 MG tablet   Chronotropic  incompetence (Chronic)   Coronary artery disease involving native coronary artery of native heart without angina pectoris (Chronic)   Relevant Medications   aspirin EC 81 MG tablet   DM (diabetes mellitus), type 2 with complications - CAD (Chronic)   Relevant Medications   aspirin EC 81 MG tablet   Dyslipidemia, goal LDL below 70 (Chronic)   Relevant Medications   aspirin EC 81 MG tablet   Essential hypertension (Chronic)   Relevant Medications   aspirin EC 81 MG tablet   Fatigue (Chronic)      This really was not a visit to discuss adverse symptoms etc.  Just really to discuss his desire to be off medications.  We spent about a half an hour speaking directly about this and about goals of care.  The end result was to simply remove most medications to allow for stable quality of life.  Current medicines are reviewed at length with the patient today. (+/- concerns) --wants to stop medications. The following changes have been made: n/a  Patient Instructions  Medication Instructions:    STOP TAKING BRILINTA AFTER CURRENT BOTTLE IS EMPTY   THE START TAKING ASPIRIN 81 MG ONE TABLET DAILY   STOP TAKING AMLODIPINE STOP TAKING IMDUR (iSOSORBIDE MONO)  WILL DISCUSS REPATHA T NEXT FOLLOW UP VISIT  If you need a refill on your cardiac medications before your next appointment, please call your pharmacy.   Lab work: NOT NEEDED If you have labs (blood work) drawn today and your tests are completely normal, you will receive your results only by: Marland Kitchen MyChart Message (if you have MyChart) OR . A paper copy in the mail If you have any lab test that is abnormal or we need to change your treatment, we will call you to review the results.  Testing/Procedures: NOT NEEDED  Follow-Up: At [facility], you and your health needs are our priority.  As part of our continuing mission to provide you with exceptional heart care, we have created designated Provider Care Teams.  These Care Teams  include your primary Cardiologist (physician) and Advanced Practice Providers (APPs -  Physician Assistants and Nurse Practitioners) who all work together to provide you with the care you need, when you need it. . Your physician recommends that you schedule a follow-up appointment in MARCH 2020 [place] .  Any Other Special Instructions Will Be Listed Below (If Applicable).   Studies Ordered:   No orders of the defined types were placed in this encounter.     Glenetta Hew, M.D., M.S. Interventional Cardiologist   Pager # [phone] Phone # [phone] [address] [place], [place] [address]

## 2018-02-06 ENCOUNTER — Encounter: Payer: Self-pay | Admitting: Cardiology

## 2018-02-08 DIAGNOSIS — E785 Hyperlipidemia, unspecified: Secondary | ICD-10-CM | POA: Diagnosis not present

## 2018-02-09 ENCOUNTER — Telehealth: Payer: Self-pay | Admitting: Cardiology

## 2018-02-09 LAB — HEPATIC FUNCTION PANEL
ALK PHOS: 59 IU/L (ref 39–117)
ALT: 11 IU/L (ref 0–44)
AST: 13 IU/L (ref 0–40)
Albumin: 4.5 g/dL (ref 3.5–4.8)
BILIRUBIN, DIRECT: 0.09 mg/dL (ref 0.00–0.40)
Bilirubin Total: 0.2 mg/dL (ref 0.0–1.2)
Total Protein: 6.7 g/dL (ref 6.0–8.5)

## 2018-02-09 LAB — LIPID PANEL
CHOL/HDL RATIO: 2.6 ratio (ref 0.0–5.0)
Cholesterol, Total: 154 mg/dL (ref 100–199)
HDL: 60 mg/dL (ref >39)
LDL Calculated: 68 mg/dL (ref 0–99)
Triglycerides: 128 mg/dL (ref 0–149)
VLDL Cholesterol Cal: 26 mg/dL (ref 5–40)

## 2018-02-09 NOTE — Telephone Encounter (Signed)
New Message          Patient's wife called, wanted Elder Cyphers) to know that the patient had a blood test done on yesterday.

## 2018-02-09 NOTE — Telephone Encounter (Signed)
Returned call to patient's wife.She stated she wanted Erasmo Downer to know husband had cholesterol checked yesterday at our [facility] wanted to know so she could send to insurance for [facility] approval.Message sent to our pharmacist.

## 2018-02-09 NOTE — Telephone Encounter (Signed)
Thanks for update. Will follow up with patient ASAP.

## 2018-02-22 ENCOUNTER — Telehealth: Payer: Self-pay

## 2018-02-22 ENCOUNTER — Other Ambulatory Visit: Payer: Self-pay | Admitting: Pharmacist Clinician (PhC)/ Clinical Pharmacy Specialist

## 2018-02-22 MED ORDER — EVOLOCUMAB 140 MG/ML ~~LOC~~ SOAJ: 140.0000 mg | SUBCUTANEOUS | 12 refills | Status: DC

## 2018-02-22 NOTE — Telephone Encounter (Signed)
Joe Murray did new rx for repatha and sent to [facility] on [address]

## 2018-02-22 NOTE — Telephone Encounter (Signed)
rx sent to [facility]

## 2018-02-22 NOTE — Telephone Encounter (Signed)
Called pt to let them know repatha pa approved and will send rx [facility] [facility] but didn't see repatha in current meds to refill will need new rx

## 2018-03-30 DIAGNOSIS — M25775 Osteophyte, left foot: Secondary | ICD-10-CM | POA: Diagnosis not present

## 2018-03-30 DIAGNOSIS — L600 Ingrowing nail: Secondary | ICD-10-CM | POA: Diagnosis not present

## 2018-04-06 ENCOUNTER — Other Ambulatory Visit (INDEPENDENT_AMBULATORY_CARE_PROVIDER_SITE_OTHER): Payer: Medicare HMO

## 2018-04-06 ENCOUNTER — Ambulatory Visit: Payer: Medicare HMO | Admitting: Neurology

## 2018-04-06 ENCOUNTER — Encounter

## 2018-04-06 ENCOUNTER — Encounter: Payer: Self-pay | Admitting: Neurology

## 2018-04-06 ENCOUNTER — Other Ambulatory Visit: Payer: Self-pay

## 2018-04-06 VITALS — BP 122/74 | HR 46 | Ht 69.0 in | Wt 210.0 lb

## 2018-04-06 DIAGNOSIS — G20 Parkinson's disease: Secondary | ICD-10-CM | POA: Diagnosis not present

## 2018-04-06 DIAGNOSIS — G3184 Mild cognitive impairment, so stated: Secondary | ICD-10-CM

## 2018-04-06 DIAGNOSIS — R413 Other amnesia: Secondary | ICD-10-CM | POA: Diagnosis not present

## 2018-04-06 NOTE — Patient Instructions (Addendum)
1. Bloodwork for TSH, B12  Your provider requests that you have LABS drawn today.  We share a lab with [facility] - they are located in [address] (second floor) of this building.  Once you get there, please have a seat and the phlebotomist will call your name.  If you have waited more than 15 minutes, please advise the front desk  2. Schedule MRI brain without contrast  We have sent a referral to [facility] for your MRI and they will call you directly to schedule your appt. They are located at [place]. If you need to contact them directly please call [phone].  3. Refer to PT for balance and gait therapy 4. Follow-up in 6 months, call for any changes   RECOMMENDATIONS FOR ALL PATIENTS WITH MEMORY PROBLEMS: 1. Continue to exercise (Recommend 30 minutes of walking everyday, or 3 hours every week) 2. Increase social interactions - continue going to [place] and enjoy social gatherings with friends and family 3. Eat healthy, avoid fried foods and eat more fruits and vegetables 4. Maintain adequate blood pressure, blood sugar, and blood cholesterol level. Reducing the risk of stroke and cardiovascular disease also helps promoting better memory. 5. Avoid stressful situations. Live a simple life and avoid aggravations. Organize your time and prepare for the next day in anticipation. 6. Sleep well, avoid any interruptions of sleep and avoid any distractions in the bedroom that may interfere with adequate sleep quality 7. Avoid sugar, avoid sweets as there is a strong link between excessive sugar intake, diabetes, and cognitive impairment The Mediterranean diet has been shown to help patients reduce the risk of progressive memory disorders and reduces cardiovascular risk. This includes eating fish, eat fruits and green leafy vegetables, nuts like almonds and hazelnuts, walnuts, and also use olive oil. Avoid fast foods and fried foods as much as possible. Avoid sweets  and sugar as sugar use has been linked to worsening of memory function.

## 2018-04-06 NOTE — Progress Notes (Addendum)
NEUROLOGY CONSULTATION NOTE  Joe LAMOUREAUX MRN: [id number] DOB: 06/28/1940  Referring provider: Dr. Alysia Penna Primary care provider: Dr. Alysia Penna  Reason for consult:  Memory loss  Dear Dr Sarajane Jews:  Thank you for your kind referral of Joe Murray for consultation of the above symptoms. Although his history is well known to you, please allow me to reiterate it for the purpose of our medical record. The patient was accompanied to the clinic by his wife who also provides collateral information. Records and images were personally reviewed where available.  HISTORY OF PRESENT ILLNESS: This is a 78 year old right-handed man with a history of hypertension, hyperlipidemia, CAD, diabetes, presenting for evaluation of memory loss. He reports his memory is lousy. He started noticing memory changes around a year ago. His wife feels memory changes started a few years ago which she attributes to statin use, however in the past year, forgetfulness has worsened. He watches a lot of Westerns but would watch the same show 1-2 weeks later, his wife reminds him he has seen it already but he denies it. He forgets conversations. He does not remember people from the past, someone would pass away and he would not remember then. No significant difficulties following directions/instructions. His wife has not noticed significant memory decline in the past year. He drives minimally due to vision issues, and denies getting lost. His wife manages finances. He was previously on several medications that were stopped 2 months ago due to fatigue and memory concerns. He is only taking glipizide. He had muscle pains on statin in the past, his wife feels the Repatha helped improve his cholesterol levels but also caused leg pain. He reports right leg pain mostly after walking a few steps, improving when he rests. His wife has noticed slowed movements, he would be very slow when opening things. He has a little trouble getting  out of a chair and feels his balance is off when he first stands, but feels fine once he starts walking. His wife  has noticed over the past year that gait has slowed down and his stride has been shorter. He speaks in a low voice and does not move his mouth much when talking, his wife reports he has always been this way. No major personality changes, no paranoia or hallucinations. No family history of dementia, no history of significant head injuries. He drinks alcohol once a week.  He has had headaches over the past year localized over the back of his left ear. He describes a throbbing pain that only occurs in the evening around 30-40 minutes after he sits in his recliner. It would start wearing off after a few hours and resolves by bedtime. He takes Tylenol every evening. No associated nausea/vomiting, photo/phonophobia. He has trouble seeing the words on TV and reports monocular diplopia in his right eye. He denies any dizziness, dysarthria/dysphagia, neck/back pain, focal numbness/tingling, bowel/bladder dysfunction, anosmia, no falls. Sleep is good, he has some daytime drowsiness and snores minimally. No REM behavior disorder noted by wife. He has had occasional left hand tremors the past year, he denies any difficulty writing with his right hand or using utensils. He is a retired Systems developer.    PAST MEDICAL HISTORY: Past Medical History:  Diagnosis Date  . CAD S/P percutaneous coronary angioplasty 10/'03; 3/'09; 7/*09; 1/'17    a. Dr. Ellyn Hack; '03 - Cypher DES 3.0 mm 33 mm proximal-mid LAD (details D1 with ostial 60%); 3/'09 dRCA 2.75 mm x  13 mm Cypher DES (2.8 mm); 7/'09 pRCA 3.0 mm x 18 m Cypher DES (3.25 mm);; (2009) 2D Echo - EF 55%, (November 2014) nonischemic Myoview;; b. Promus DES to RCA and mid LAD 02/26/2015  . Chronotropic incompetence 12/2011   ETT 11/2017: Normal blood pressure response.  No EKG changes.  Exercise stopped due to fatigue and dyspnea.  Heart rate increased to low  90s from 50s.  Suggest chronotropic incompetence.  Severely impaired exercise capacity. -->  Results reviewed with electrophysiology: Still not indication for pacemaker.  . Diabetes mellitus type 2 in obese (HCC)    CAD  . Diverticulosis   . Dyslipidemia, goal LDL below 70    on [employer] study medicaion.  . ED (erectile dysfunction)   . GERD (gastroesophageal reflux disease)   . Hemorrhoids 07/2002   Internal and External  . Insomnia   . Non-STEMI (non-ST elevated myocardial infarction) ([place]) 11/2001   a. Proximal LAD tandem lesions -- long DES stent covering both;; b. Jan 2017: mRCA PCI, [place] PCI  . Persistent sinus bradycardia   . Personal history of colonic adenomas 09/06/2012  . Psoriasis    Resting HR in 54s  . Spasm of esophagus   . SVT (supraventricular tachycardia) ([place]) 2003  . Vertigo, benign positional     PAST SURGICAL HISTORY: Past Surgical History:  Procedure Laterality Date  . CARDIAC CATHETERIZATION N/A 02/26/2015   Procedure: Left Heart Cath and Coronary Angiography;  Surgeon: Leonie Man, MD: mRCA 99% --> PCI. mLAD 75%-> FFR Guided PCI, Mod ISR in pLAD & pRCA  . CARDIAC CATHETERIZATION N/A 02/26/2015   Procedure: Coronary Stent Intervention;  Surgeon: Leonie Man, MD;  Location: [facility]: mRCA Promus Premier DES 3.0 x 12 (3.5), mLAD Promus Premier DES 2.75 x 20 (3.0)  . CARDIAC CATHETERIZATION N/A 02/26/2015   Procedure: Intravascular Pressure Wire/FFR Study;  Surgeon: Leonie Man, MD;  Location: [facility];  Service: Cardiovascular: LAD 75% - FFR + -> PCI  . CATARACT EXTRACTION  [id number]  . COLONOSCOPY  08-31-12   per Dr. Carlean Purl, adenomatous polyps, repeat in 3 years   . CORONARY ANGIOPLASTY WITH STENT PLACEMENT  LAD - 2003, RCA 3 & 7/ '09   Cypher 3.0 x 32 mid LAD; Cyper 2.75 x 13 - distal RCA, 3.0 x 18 Prox RCA  . ESOPHAGOGASTRODUODENOSCOPY  08-11-02   esophageal dilation per Dr. Carlean Purl  . HERNIA REPAIR     right inguinal   . NM  MYOVIEW LTD  Nov 2014   ~8 METS; EF 60%, no ischemia or infarction  . PERCUTANEOUS CORONARY STENT INTERVENTION (PCI-S)  11/23/2001   NSTEMI: Prox-Mid LAD tandem ~80% lesions on either side of D1 (with 80% lesion) -- Cypher DES 3.0 mm x 33 m (covering both lesions)   . PERCUTANEOUS CORONARY STENT INTERVENTION (PCI-S)  04/27/2007   Unstable Angina: Distal RCA 95%: Cypher 2.75x13  (2.8 mm); residual focal ~60%ISR in LAD stent, 60% D1 ostial (no PTCA on LAD due to no ischemia on ST)  . PERCUTANEOUS CORONARY STENT INTERVENTION (PCI-S)  09/21/2007   Bradycardia & Unstable Angina: Prox RCA 70% - PCI Cypher DES 3.0 mm x 18 mm  (3.25 mm); ostial 60-70% jailed D1. LAD & distal RCA stents patent  . SHOULDER SURGERY     left rotator cuff, Dr. Gladstone Lighter  . TRANSTHORACIC ECHOCARDIOGRAM  March 2009   Normal LV size and function, EF 55%. Mild MR; mild RV dilation.  . TRANSTHORACIC ECHOCARDIOGRAM  02/2015   EF 60-65%. Moderate concentric LVH. Normal function with normal regional wall motion. GR 1 DD  . WRIST FRACTURE SURGERY     right    MEDICATIONS: Current Outpatient Medications on File Prior to Visit  Medication Sig Dispense Refill  . Cholecalciferol (VITAMIN D3) 5000 UNITS CAPS Take 5,000 Units by mouth daily. 30 capsule 0  . Cinnamon 500 MG capsule Take 2,000 mg by mouth daily.     . fish oil-omega-3 fatty acids 1000 MG capsule Take 2 g by mouth daily.      Marland Kitchen glipiZIDE (GLUCOTROL) 5 MG tablet Take 1 tablet (5 mg total) by mouth 2 (two) times daily before a meal. 180 tablet 3  . Lancets (BD LANCET ULTRAFINE 30G) MISC USE ONE  TO CHECK GLUCOSE ONCE DAILY 100 each 1  . MAGNESIUM CHLORIDE PO Take 1 tablet by mouth daily.      . potassium gluconate 595 MG TABS tablet Take 595 mg by mouth.    . Evolocumab (REPATHA SURECLICK) 856 MG/ML SOAJ Inject 140 mg into the skin every 14 (fourteen) days. (Patient not taking: Reported on 04/06/2018) 2 pen 12  . meclizine (ANTIVERT) 25 MG tablet TAKE ONE TABLET BY MOUTH THREE  TIMES DAILY AS NEEDED FOR DIZZINESS (Patient not taking: Reported on 04/06/2018) 90 tablet 0  . methocarbamol (ROBAXIN) 500 MG tablet Take 1 tablet (500 mg total) by mouth every 6 (six) hours as needed (neck pain). (Patient not taking: Reported on 04/06/2018) 60 tablet 2  . nitroGLYCERIN (NITROSTAT) 0.4 MG SL tablet PLACE 1 TABLET UNDER THE TONGUE EVERY 5 MINUTES FOR 3 DOSES AS NEEDED FOR CHEST PAIN (Patient not taking: Reported on 04/06/2018) 25 tablet 3  . ONE TOUCH ULTRA TEST test strip USE ONE STRIP TO CHECK GLUCOSE ONCE DAILY (Patient not taking: Reported on 04/06/2018) 100 each 1   No current facility-administered medications on file prior to visit.     ALLERGIES: Allergies  Allergen Reactions  . Beta Adrenergic Blockers Other (See Comments)    chronotropic incompetence  . Praluent [Alirocumab]     Diffuse Rash - improved when stopping & taking Benadryl  . Sulfonamide Derivatives     unknown  . Statins Other (See Comments)    REACTION: myalgias    FAMILY HISTORY: Family History  Problem Relation Age of Onset  . Diabetes Mother   . Hearing loss Father   . Dementia Father   . Heart attack Father 28       first MI prior to age 74  . Heart attack Brother     SOCIAL HISTORY: Social History   Socioeconomic History  . Marital status: Married    Spouse name: Not on file  . Number of children: 4  . Years of education: Not on file  . Highest education level: Not on file  Occupational History  . Occupation: retired  Scientific laboratory technician  . Financial resource strain: Not on file  . Food insecurity:    Worry: Not on file    Inability: Not on file  . Transportation needs:    Medical: Not on file    Non-medical: Not on file  Tobacco Use  . Smoking status: Current Every Day Smoker    Types: Cigars    Start date: 07/06/1952  . Smokeless tobacco: Never Used  . Tobacco comment: 1-2 per day; he says "I really don't inhale"  Substance and Sexual Activity  . Alcohol use: Yes     Alcohol/week: 0.0 standard drinks  Comment: occ  . Drug use: No  . Sexual activity: Never  Lifestyle  . Physical activity:    Days per week: Not on file    Minutes per session: Not on file  . Stress: Not on file  Relationships  . Social connections:    Talks on phone: Not on file    Gets together: Not on file    Attends religious service: Not on file    Active member of club or organization: Not on file    Attends meetings of clubs or organizations: Not on file    Relationship status: Not on file  . Intimate partner violence:    Fear of current or ex partner: Not on file    Emotionally abused: Not on file    Physically abused: Not on file    Forced sexual activity: Not on file  Other Topics Concern  . Not on file  Social History Narrative   He is a married, father of 67, grandfather 3.   He still smokes 2-3 cigars per day. States that he "does not really inhale ". He is not really all that it's including, stating that he wants to have his 1 remaining vice.   Exercises only on occasion.   He does various landscaping jobs including cutting wood, and clearing brush.    REVIEW OF SYSTEMS: Constitutional: No fevers, chills, or sweats, no generalized fatigue, change in appetite Eyes: No visual changes, double vision, eye pain Ear, nose and throat: No hearing loss, ear pain, nasal congestion, sore throat Cardiovascular: No chest pain, palpitations Respiratory:  No shortness of breath at rest or with exertion, wheezes GastrointestinaI: No nausea, vomiting, diarrhea, abdominal pain, fecal incontinence Genitourinary:  No dysuria, urinary retention or frequency Musculoskeletal:  No neck pain, back pain Integumentary: No rash, pruritus, skin lesions Neurological: as above Psychiatric: No depression, insomnia, anxiety Endocrine: No palpitations, fatigue, diaphoresis, mood swings, change in appetite, change in weight, increased thirst Hematologic/Lymphatic:  No anemia, purpura,  petechiae. Allergic/Immunologic: no itchy/runny eyes, nasal congestion, recent allergic reactions, rashes  PHYSICAL EXAM: Vitals:   04/06/18 0905  BP: 122/74  Pulse: (!) 46  SpO2: 96%   General: No acute distress, flat affect/masked facies with hypophonia Head:  Normocephalic/atraumatic Eyes: Fundoscopic exam shows bilateral sharp discs, no vessel changes, exudates, or hemorrhages Neck: supple, no paraspinal tenderness, full range of motion Back: No paraspinal tenderness Heart: bradycardia, regular rhythm Lungs: Clear to auscultation bilaterally. Vascular: No carotid bruits. Skin/Extremities: No rash, no edema Neurological Exam: Mental status: alert and oriented to person, place, and time, no dysarthria or aphasia, Fund of knowledge is appropriate.  Recent and remote memory are impaired.  Attention and concentration are normal.    Able to name objects and repeat phrases.  Montreal Cognitive Assessment  04/06/2018  Visuospatial/ Executive (0/5) 4  Naming (0/3) 3  Attention: Read list of digits (0/2) 2  Attention: Read list of letters (0/1) 1  Attention: Serial 7 subtraction starting at 100 (0/3) 3  Language: Repeat phrase (0/2) 1  Language : Fluency (0/1) 0  Abstraction (0/2) 2  Delayed Recall (0/5) 2  Orientation (0/6) 6  Total 24  Adjusted Score (based on education) 25   Cranial nerves: CN I: not tested CN II: pupils equal, round and reactive to light, visual fields intact, fundi unremarkable. CN III, IV, VI:  full range of motion, no nystagmus, no ptosis CN V: facial sensation intact CN VII: upper and lower face symmetric CN VIII: hearing intact to  conversation CN IX, X: gag intact, uvula midline CN XI: sternocleidomastoid and trapezius muscles intact CN XII: tongue midline Bulk & Tone: normal, no clear cogwheeling, no fasciculations. Motor: 5/5 throughout with no pronator drift. Sensation: intact to light touch, cold, pin, vibration and joint position sense.  No  extinction to double simultaneous stimulation.  Romberg test slight sway Deep Tendon Reflexes: +2 throughout, no ankle clonus Plantar responses: downgoing bilaterally Cerebellar: no incoordination on finger to nose testing Gait: unable to rise from chair with arms crossed over chest, reduced arm swing bilaterally, slightly hunched posture with slow small steps, unable to tandem walk Tremor: no clear tremor on today's visit  IMPRESSION: This is a 78 year old right-handed man with a history of hypertension, hyperlipidemia, diabetes, CAD, presenting for evaluation of memory concerns. His neurological exam shows masked facies, hypophonia (wife reports this is not new), gait has some parkinsonian features, however he does not have any other clinical symptoms (tremor, postural instability). His MOCA score today is 25/30. Symptoms suggestive of mild cognitive impairment, possibly vascular. Exam may be due to vascular parkinsonism as well. We discussed different causes of memory loss. Check TSH and B12. MRI brain without contrast will be ordered to assess for underlying structural abnormality and assess vascular load. They are not interested in starting any medication at this time, if we would start a medication, I would use Memantine since he is bradycardic and would avoid Donepezil. We discussed doing PT for gait and balance therapy. We discussed the importance of control of vascular risk factors, physical exercise, and brain stimulation exercises for brain health. He will follow-up in 6 months and knows to call for any changes.   Thank you for allowing me to participate in the care of this patient. Please do not hesitate to call for any questions or concerns.   Ellouise Newer, M.D.  CC: Dr. Sarajane Jews

## 2018-04-07 ENCOUNTER — Telehealth: Payer: Self-pay

## 2018-04-07 LAB — VITAMIN B12: VITAMIN B 12: 312 pg/mL (ref 200–1100)

## 2018-04-07 LAB — TSH: TSH: 1.94 mIU/L (ref 0.40–4.50)

## 2018-04-07 NOTE — Telephone Encounter (Signed)
-----   Message from Cameron Sprang, MD sent at 04/07/2018 12:53 PM EST ----- Pls let patient/wife know the thyroid was normal, B12 was low normal, 316. We usually want level to be above 400, recommend start B12 528mcg daily. Thanks

## 2018-04-07 NOTE — Telephone Encounter (Signed)
Spoke with pt's wife, Tamela Oddi, relaying message below.

## 2018-04-14 ENCOUNTER — Other Ambulatory Visit: Payer: Self-pay

## 2018-04-14 DIAGNOSIS — G20 Parkinson's disease: Secondary | ICD-10-CM

## 2018-04-16 ENCOUNTER — Ambulatory Visit
Admission: RE | Admit: 2018-04-16 | Discharge: 2018-04-16 | Disposition: A | Discharge status: Home / Self Care | Payer: Medicare HMO | Source: Ambulatory Visit | Attending: Neurology | Admitting: Neurology

## 2018-04-16 ENCOUNTER — Other Ambulatory Visit: Payer: Self-pay | Admitting: Family Medicine

## 2018-04-16 DIAGNOSIS — R413 Other amnesia: Secondary | ICD-10-CM | POA: Diagnosis not present

## 2018-04-18 DIAGNOSIS — R69 Illness, unspecified: Secondary | ICD-10-CM | POA: Diagnosis not present

## 2018-04-22 ENCOUNTER — Telehealth: Payer: Self-pay

## 2018-04-22 NOTE — Telephone Encounter (Signed)
Spoke with pt's wife relaying results below.  [name] asks if the bone spur appeared on the MRI.  States that pt is having headaches and was told that he had a bone spur.

## 2018-04-22 NOTE — Telephone Encounter (Signed)
-----   Message from Cameron Sprang, MD sent at 04/18/2018  8:55 AM EST ----- Pls let patient/wife know I reviewed MRI brain, no evidence of tumor, stroke, or bleed. It shows age-related changes. Thanks

## 2018-04-26 NOTE — Telephone Encounter (Signed)
There is none mentioned on the MRI brain results, but the bone spur on his neck xray is in the lower neck, which is not seen on brain scan. Thanks

## 2018-05-06 ENCOUNTER — Ambulatory Visit: Payer: Medicare HMO | Admitting: Physical Therapy

## 2018-05-10 ENCOUNTER — Ambulatory Visit (INDEPENDENT_AMBULATORY_CARE_PROVIDER_SITE_OTHER): Payer: Medicare HMO | Admitting: Family Medicine

## 2018-05-10 ENCOUNTER — Encounter: Payer: Self-pay | Admitting: Family Medicine

## 2018-05-10 ENCOUNTER — Telehealth: Payer: Self-pay | Admitting: Family Medicine

## 2018-05-10 ENCOUNTER — Other Ambulatory Visit: Payer: Self-pay

## 2018-05-10 VITALS — BP 100/66 | HR 52 | Temp 97.9°F

## 2018-05-10 DIAGNOSIS — B349 Viral infection, unspecified: Secondary | ICD-10-CM

## 2018-05-10 DIAGNOSIS — R059 Cough, unspecified: Secondary | ICD-10-CM

## 2018-05-10 DIAGNOSIS — R05 Cough: Secondary | ICD-10-CM

## 2018-05-10 DIAGNOSIS — R6889 Other general symptoms and signs: Secondary | ICD-10-CM | POA: Diagnosis not present

## 2018-05-10 LAB — POCT INFLUENZA A/B
Influenza A, POC: NEGATIVE
Influenza B, POC: NEGATIVE

## 2018-05-10 NOTE — Patient Instructions (Signed)
Person Under Monitoring Name: Joe Murray  Location: [place] [address]   Infection Prevention Recommendations for Individuals Confirmed to have, or Being Evaluated for, 2019 Novel Coronavirus (COVID-19) Infection Who Receive Care at Home  Individuals who are confirmed to have, or are being evaluated for, COVID-19 should follow the prevention steps below until a healthcare provider or local or state health department says they can return to normal activities.  Stay home except to get medical care You should restrict activities outside your home, except for getting medical care. Do not go to work, school, or public areas, and do not use public transportation or taxis.  Call ahead before visiting your doctor Before your medical appointment, call the healthcare provider and tell them that you have, or are being evaluated for, COVID-19 infection. This will help the healthcare providers office take steps to keep other people from getting infected. Ask your healthcare provider to call the local or state health department.  Monitor your symptoms Seek prompt medical attention if your illness is worsening (e.g., difficulty breathing). Before going to your medical appointment, call the healthcare provider and tell them that you have, or are being evaluated for, COVID-19 infection. Ask your healthcare provider to call the local or state health department.  Wear a facemask You should wear a facemask that covers your nose and mouth when you are in the same room with other people and when you visit a healthcare provider. People who live with or visit you should also wear a facemask while they are in the same room with you.  Separate yourself from other people in your home As much as possible, you should stay in a different room from other people in your home. Also, you should use a separate bathroom, if available.  Avoid sharing household items You should not  share dishes, drinking glasses, cups, eating utensils, towels, bedding, or other items with other people in your home. After using these items, you should wash them thoroughly with soap and water.  Cover your coughs and sneezes Cover your mouth and nose with a tissue when you cough or sneeze, or you can cough or sneeze into your sleeve. Throw used tissues in a lined trash can, and immediately wash your hands with soap and water for at least 20 seconds or use an alcohol-based hand rub.  Wash your [facility] [employer] your hands often and thoroughly with soap and water for at least 20 seconds. You can use an alcohol-based hand sanitizer if soap and water are not available and if your hands are not visibly dirty. Avoid touching your eyes, nose, and mouth with unwashed hands.   Prevention Steps for Caregivers and Household Members of Individuals Confirmed to have, or Being Evaluated for, COVID-19 Infection Being Cared for in the Home  If you live with, or provide care at home for, a person confirmed to have, or being evaluated for, COVID-19 infection please follow these guidelines to prevent infection:  Follow healthcare providers instructions Make sure that you understand and can help the patient follow any healthcare provider instructions for all care.  Provide for the patients basic needs You should help the patient with basic needs in the home and provide support for getting groceries, prescriptions, and other personal needs.  Monitor the patients symptoms If they are getting sicker, call his or her medical provider and tell them that the patient has, or is being evaluated for, COVID-19 infection. This will help the healthcare providers  office take steps to keep other people from getting infected. Ask the healthcare provider to call the local or state health department.  Limit the number of people who have contact with the patient  If possible, have only one caregiver for the  patient.  Other household members should stay in another home or place of residence. If this is not possible, they should stay  in another room, or be separated from the patient as much as possible. Use a separate bathroom, if available.  Restrict visitors who do not have an essential need to be in the home.  Keep older adults, very young children, and other sick people away from the patient Keep older adults, very young children, and those who have compromised immune systems or chronic health conditions away from the patient. This includes people with chronic heart, lung, or kidney conditions, diabetes, and cancer.  Ensure good ventilation Make sure that shared spaces in the home have good air flow, such as from an air conditioner or an opened window, weather permitting.  Wash your hands often  Wash your hands often and thoroughly with soap and water for at least 20 seconds. You can use an alcohol based hand sanitizer if soap and water are not available and if your hands are not visibly dirty.  Avoid touching your eyes, nose, and mouth with unwashed hands.  Use disposable paper towels to dry your hands. If not available, use dedicated cloth towels and replace them when they become wet.  Wear a facemask and gloves  Wear a disposable facemask at all times in the room and gloves when you touch or have contact with the patients blood, body fluids, and/or secretions or excretions, such as sweat, saliva, sputum, nasal mucus, vomit, urine, or feces.  Ensure the mask fits over your nose and mouth tightly, and do not touch it during use.  Throw out disposable facemasks and gloves after using them. Do not reuse.  Wash your hands immediately after removing your facemask and gloves.  If your personal clothing becomes contaminated, carefully remove clothing and launder. Wash your hands after handling contaminated clothing.  Place all used disposable facemasks, gloves, and other waste in a lined  container before disposing them with other household waste.  Remove gloves and wash your hands immediately after handling these items.  Do not share dishes, glasses, or other household items with the patient  Avoid sharing household items. You should not share dishes, drinking glasses, cups, eating utensils, towels, bedding, or other items with a patient who is confirmed to have, or being evaluated for, COVID-19 infection.  After the person uses these items, you should wash them thoroughly with soap and water.  Wash laundry thoroughly  Immediately remove and wash clothes or bedding that have blood, body fluids, and/or secretions or excretions, such as sweat, saliva, sputum, nasal mucus, vomit, urine, or feces, on them.  Wear gloves when handling laundry from the patient.  Read and follow directions on labels of laundry or clothing items and detergent. In general, wash and dry with the warmest temperatures recommended on the label.  Clean all areas the individual has used often  Clean all touchable surfaces, such as counters, tabletops, doorknobs, bathroom fixtures, toilets, phones, keyboards, tablets, and bedside tables, every day. Also, clean any surfaces that may have blood, body fluids, and/or secretions or excretions on them.  Wear gloves when cleaning surfaces the patient has come in contact with.  Use a diluted bleach solution (e.g., dilute bleach with 1  part bleach and 10 parts water) or a household disinfectant with a label that says EPA-registered for coronaviruses. To make a bleach solution at home, add 1 tablespoon of bleach to 1 quart (4 cups) of water. For a larger supply, add  cup of bleach to 1 gallon (16 cups) of water.  Read labels of cleaning products and follow recommendations provided on product labels. Labels contain instructions for safe and effective use of the cleaning product including precautions you should take when applying the product, such as wearing gloves or  eye protection and making sure you have good ventilation during use of the product.  Remove gloves and wash hands immediately after cleaning.  Monitor yourself for signs and symptoms of illness Caregivers and household members are considered close contacts, should monitor their health, and will be asked to limit movement outside of the home to the extent possible. Follow the monitoring steps for close contacts listed on the symptom monitoring form.   ? If you have additional questions, contact your local health department or call the epidemiologist on call at [phone] (available 24/7). ? This guidance is subject to change. For the most up-to-date guidance from [facility], please refer to their website: [email or web address].pl     Person Under Monitoring Name: Joe Murray  Location: [place]  [id number]   CORONAVIRUS DISEASE 2019 (COVID-19) Guidance for Persons Under Investigation You are being tested for the virus that causes coronavirus disease 2019 (COVID-19). Public health actions are necessary to ensure protection of your health and the health of others, and to prevent further spread of infection. COVID-19 is caused by a virus that can cause symptoms, such as fever, cough, and shortness of breath. The primary transmission from person to person is by coughing or sneezing. On March 24, 2018, the [place] announced a [employer] Emergency of International Concern and on March 25, 2018 the U.S. Department of Health and Human Services declared a public health emergency. If the virus that causesCOVID-19 spreads in the community, it could have severe public health consequences.  As a person under investigation for COVID-19, the [place] advises you to adhere to the following guidance until your test results are reported to  you. If your test result is positive, you will receive additional information from your provider and your local health department at that time.   Remain at home until you are cleared by your health provider or public health authorities.   Keep a log of visitors to your home using the form provided. Any visitors to your home must be aware of your isolation status.  If you plan to move to a new address or leave the county, notify the local health department in your county.  Call a doctor or seek care if you have an urgent medical need. Before seeking medical care, call ahead and get instructions from the provider before arriving at the medical office, clinic or hospital. Notify them that you are being tested for the virus that causes COVID-19 so arrangements can be made, as necessary, to prevent transmission to others in the healthcare setting. Next, notify the local health department in your county.  If a medical emergency arises and you need to call 911, inform the first responders that you are being tested for the virus that causes COVID-19. Next, notify the local health department in your county.  Adhere to all guidance set forth by the [facility]  Division of Public Health for Home Care of patients that is based on guidance from the Center for Disease Control and Prevention with suspected or confirmed COVID-19. It is provided with this guidance for Persons Under Investigation.  Your health and the health of our community are our top priorities. Public Health officials remain available to provide assistance and counseling to you about COVID-19 and compliance with this guidance.  Provider: ____________________________________________________________ Date: ______/_____/_________  By signing below, you acknowledge that you have read and agree to comply with this Guidance for Persons Under Investigation. ______________________________________________________________ Date:  ______/_____/_________  WHO DO I CALL? You can find a list of local health departments here: [email or web address] Health Department: ____________________________________________________________________ Contact Name: ________________________________________________________________________ Telephone: ___________________________________________________________________________  Marice Potter, [place], Communicable Disease Branch COVID-19 Guidance for Persons Under Investigation April 30, 2018  Person Under Monitoring Name: RAHMIR BEEVER  Location: [place] [address]   Record here the list of visitors to your home since you became ill with respiratory symptoms that led you to consult a health provider:  Visitor Name Date Time In Time Out Did this person come within 6 feet of you? Indicate Y or N Relationship to Person Under Monitoring Phone number Comments   ___/____/____ __:__ AM/PM __:__ AM/PM       ___/____/____ __:__ AM/PM __:__ AM/PM       ___/____/____ __:__ AM/PM __:__ AM/PM       ___/____/____ __:__ AM/PM __:__ AM/PM       ___/____/____ __:__ AM/PM __:__ AM/PM       ___/____/____ __:__ AM/PM __:__ AM/PM       ___/____/____ __:__ AM/PM __:__ AM/PM       ___/____/____ __:__ AM/PM __:__ AM/PM       ___/____/____ __:__ AM/PM __:__ AM/PM       ___/____/____ __:__ AM/PM __:__ AM/PM       ___/____/____ __:__ AM/PM __:__ AM/PM       ___/____/____ __:__ AM/PM __:__ AM/PM       ___/____/____ __:__ AM/PM __:__ AM/PM       ___/____/____ __:__ AM/PM __:__ AM/PM       Marice Potter, [place], Communicable Disease Branch

## 2018-05-10 NOTE — Progress Notes (Signed)
   Subjective:    Patient ID: Joe Murray, male    DOB: [birth year], 78 y.o.   MRN: [id number]  HPI Here for one week of stuffy head, PND, chest tightness, dry cough, and body aches. No fever. 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 This conference lasted 3 days and they had meetings and buffet dinners together every day in a hotel conference room. He says his wife has had similar symptoms and saw her medical clinic last week. She was diagnosed with bronchitis and was given an antibiotic, but she has not improved.    Review of Systems  Constitutional: Negative.   HENT: Positive for congestion and postnasal drip. Negative for sinus pressure, sinus pain and sore throat.   Eyes: Negative.   Respiratory: Positive for cough and chest tightness. Negative for shortness of breath and wheezing.   Cardiovascular: Negative.   Gastrointestinal: Negative.   Musculoskeletal: Positive for myalgias.  Neurological: Positive for headaches.       Objective:   Physical Exam Constitutional:      Appearance: Normal appearance. He is not ill-appearing.  HENT:     Right Ear: Tympanic membrane and ear canal normal.     Left Ear: Tympanic membrane and ear canal normal.     Nose: Nose normal.     Mouth/Throat:     Pharynx: Oropharynx is clear.  Eyes:     Conjunctiva/sclera: Conjunctivae normal.  Pulmonary:     Effort: Pulmonary effort is normal. No respiratory distress.     Breath sounds: Normal breath sounds. No stridor. No wheezing, rhonchi or rales.  Lymphadenopathy:     Cervical: No cervical adenopathy.  Neurological:     Mental Status: He is alert.           Assessment & Plan:  Viral illness. He can drink fluids and use Delsym prn. We will send him directly to our testing site for Covid-19 today to evaluate for this, and suggested he have his wife be tested for this as well.  Alysia Penna, MD

## 2018-05-10 NOTE — Telephone Encounter (Signed)
.   Questions for Screening COVID-19  Symptom onset:  Cough Chest congestion - "congested cough"  Travel or Contacts:  [facility] - with a group of people at a meeting. Wife got sick x 1 week ago with cough/congestion -- she traveled to [place] [facility] [place] - started prior to leaving.  During this illness, did/does the patient experience any of the following symptoms? Fever >100.39F []   Yes [x]   No []   Unknown Subjective fever (felt feverish) []   Yes [x]   No []   Unknown Chills []   Yes [x]   No []   Unknown Muscle aches (myalgia) []   Yes [x]   No []   Unknown Runny nose (rhinorrhea) [x]   Yes []   No []   Unknown Sore throat []   Yes [x]   No []   Unknown Cough (new onset or worsening of chronic cough) [x]   Yes []   No []   Unknown Shortness of breath (dyspnea) []   Yes [x]   No []   Unknown Nausea or vomiting []   Yes [x]   No []   Unknown Headache []   Yes [x]   No []   Unknown Abdominal pain  []   Yes [x]   No []   Unknown Diarrhea (?3 loose/looser than normal stools/24hr period) []   Yes [x]   No []   Unknown Other, specify:_____________________________________________   Patient risk factors: Smoker? [x]   Current []   Former []   Never If male, currently pregnant? []   Yes []   No  Patient Active Problem List   Diagnosis Date Noted  . Intermittent claudication ([place]) 12/01/2017  . Trochanteric bursitis of right hip 09/04/2017  . Fuchs' corneal dystrophy 08/14/2016  . Essential hypertension 03/05/2015  . NSTEMI (non-ST elevated myocardial infarction) ([place])   . Coronary artery disease involving native coronary artery of native heart without angina pectoris   . Vitamin D deficiency 07/04/2014  . Fatigue 01/08/2014  . Obesity (BMI 30-39.9) 01/12/2013  . DM (diabetes mellitus), type 2 with complications - CAD   . Dyslipidemia, goal LDL below 70   . Exertional shortness of breath 01/12/2012  . Chronotropic incompetence 12/25/2011  . Vertigo with mild postural lightheadedness. 08/13/2009  . ERECTILE DYSFUNCTION,  ORGANIC 05/01/2009  . GERD 04/18/2008  . CONTUSION, LOWER LEG 04/18/2008  . BPH with urinary obstruction 07/27/2007  . CAD S/P percutaneous coronary angioplasty - LADx2, & RCA x 3 07/12/2001    Class: History of    Plan:  []   High risk for COVID-19 with red flags go to ED (with CP, SOB, weak/lightheaded, or fever > 101.5). Call ahead.  [x]   High risk for COVID-19 but stable will have car visit. Inform provider and coordinate time. Will be completed in afternoon. []   No red flags but URI signs or symptoms will go through side door and be seen in dedicated room.  Note: Referral to telemedicine is an appropriate alternative disposition for higher risk but stable. [facility]/e-Visit: [phone].

## 2018-05-11 ENCOUNTER — Other Ambulatory Visit: Payer: Self-pay

## 2018-05-11 DIAGNOSIS — R6889 Other general symptoms and signs: Secondary | ICD-10-CM

## 2018-05-16 LAB — NOVEL CORONAVIRUS, NAA: SARS-CoV-2, NAA: NOT DETECTED

## 2018-05-18 ENCOUNTER — Ambulatory Visit: Payer: Medicare HMO | Admitting: Cardiology

## 2018-10-26 DIAGNOSIS — H5213 Myopia, bilateral: Secondary | ICD-10-CM | POA: Diagnosis not present

## 2018-10-26 DIAGNOSIS — H1851 Endothelial corneal dystrophy: Secondary | ICD-10-CM | POA: Diagnosis not present

## 2018-10-26 DIAGNOSIS — Z947 Corneal transplant status: Secondary | ICD-10-CM | POA: Diagnosis not present

## 2018-10-26 DIAGNOSIS — Z961 Presence of intraocular lens: Secondary | ICD-10-CM | POA: Diagnosis not present

## 2018-11-04 ENCOUNTER — Encounter: Payer: Self-pay | Admitting: Family Medicine

## 2018-11-04 ENCOUNTER — Ambulatory Visit (INDEPENDENT_AMBULATORY_CARE_PROVIDER_SITE_OTHER): Payer: Medicare HMO | Admitting: Family Medicine

## 2018-11-04 ENCOUNTER — Other Ambulatory Visit: Payer: Self-pay

## 2018-11-04 VITALS — BP 124/64 | HR 63 | Temp 98.6°F | Wt 195.6 lb

## 2018-11-04 DIAGNOSIS — K219 Gastro-esophageal reflux disease without esophagitis: Secondary | ICD-10-CM | POA: Diagnosis not present

## 2018-11-04 DIAGNOSIS — N401 Enlarged prostate with lower urinary tract symptoms: Secondary | ICD-10-CM | POA: Diagnosis not present

## 2018-11-04 DIAGNOSIS — Z23 Encounter for immunization: Secondary | ICD-10-CM

## 2018-11-04 DIAGNOSIS — N138 Other obstructive and reflux uropathy: Secondary | ICD-10-CM

## 2018-11-04 DIAGNOSIS — E559 Vitamin D deficiency, unspecified: Secondary | ICD-10-CM | POA: Diagnosis not present

## 2018-11-04 DIAGNOSIS — E118 Type 2 diabetes mellitus with unspecified complications: Secondary | ICD-10-CM

## 2018-11-04 DIAGNOSIS — I10 Essential (primary) hypertension: Secondary | ICD-10-CM

## 2018-11-04 DIAGNOSIS — I2510 Atherosclerotic heart disease of native coronary artery without angina pectoris: Secondary | ICD-10-CM | POA: Diagnosis not present

## 2018-11-04 DIAGNOSIS — E785 Hyperlipidemia, unspecified: Secondary | ICD-10-CM

## 2018-11-04 NOTE — Progress Notes (Signed)
Subjective:    Patient ID: Joe Murray, male    DOB: [birth year], 78 y.o.   MRN: [id number]  HPI Here to follow up on issues. He feels well in general. He had been feeling very fatigued and he and Dr. Ellyn Hack agreed last December for him to stop the Repatha. Since then he says he does feel a little better. He had a recent eye exam and he was negative for diabetic retinopathy but he has borderline glaucoma. He has been Neurology for tremors, some mild Parkinsonian-like symptoms, and some memory issues. He feels that his memory has improved slightly since stopping the Repatha.    Review of Systems  Constitutional: Negative.   HENT: Negative.   Eyes: Negative.   Respiratory: Negative.   Cardiovascular: Negative.   Gastrointestinal: Negative.   Genitourinary: Negative.   Musculoskeletal: Negative.   Skin: Negative.   Neurological: Negative.   Psychiatric/Behavioral: Negative.        Objective:   Physical Exam Constitutional:      General: He is not in acute distress.    Appearance: He is well-developed. He is not diaphoretic.  HENT:     Head: Normocephalic and atraumatic.     Right Ear: External ear normal.     Left Ear: External ear normal.     Nose: Nose normal.     Mouth/Throat:     Pharynx: No oropharyngeal exudate.  Eyes:     General: No scleral icterus.       Right eye: No discharge.        Left eye: No discharge.     Conjunctiva/sclera: Conjunctivae normal.     Pupils: Pupils are equal, round, and reactive to light.  Neck:     Musculoskeletal: Neck supple.     Thyroid: No thyromegaly.     Vascular: No JVD.     Trachea: No tracheal deviation.  Cardiovascular:     Rate and Rhythm: Normal rate and regular rhythm.     Heart sounds: Normal heart sounds. No murmur. No friction rub. No gallop.   Pulmonary:     Effort: Pulmonary effort is normal. No respiratory distress.     Breath sounds: Normal breath sounds. No wheezing or rales.  Chest:     Chest wall: No  tenderness.  Abdominal:     General: Bowel sounds are normal. There is no distension.     Palpations: Abdomen is soft. There is no mass.     Tenderness: There is no abdominal tenderness. There is no guarding or rebound.  Genitourinary:    Penis: Normal. No tenderness.      Scrotum/Testes: Normal.     Prostate: Normal.     Rectum: Normal. Guaiac result negative.  Musculoskeletal: Normal range of motion.        General: No tenderness.  Lymphadenopathy:     Cervical: No cervical adenopathy.  Skin:    General: Skin is warm and dry.     Coloration: Skin is not pale.     Findings: No erythema or rash.  Neurological:     Mental Status: He is alert and oriented to person, place, and time.     Cranial Nerves: No cranial nerve deficit.     Motor: No abnormal muscle tone.     Coordination: Coordination normal.     Deep Tendon Reflexes: Reflexes are normal and symmetric. Reflexes normal.  Psychiatric:        Behavior: Behavior normal.        Thought  Content: Thought content normal.        Judgment: Judgment normal.           Assessment & Plan:  His HTN an GERD are stable. His memory loss is stable. We will set up fasting labs soon to check his lipids, an A1c, etc. He is scheduled to see Dr. Delice Lesch on 9-21 and Dr. Ellyn Hack on 10-7.  Alysia Penna, MD

## 2018-11-09 ENCOUNTER — Other Ambulatory Visit: Payer: Self-pay

## 2018-11-09 ENCOUNTER — Other Ambulatory Visit (INDEPENDENT_AMBULATORY_CARE_PROVIDER_SITE_OTHER): Payer: Medicare HMO

## 2018-11-09 DIAGNOSIS — E118 Type 2 diabetes mellitus with unspecified complications: Secondary | ICD-10-CM

## 2018-11-09 DIAGNOSIS — N138 Other obstructive and reflux uropathy: Secondary | ICD-10-CM

## 2018-11-09 DIAGNOSIS — N401 Enlarged prostate with lower urinary tract symptoms: Secondary | ICD-10-CM

## 2018-11-09 DIAGNOSIS — I10 Essential (primary) hypertension: Secondary | ICD-10-CM | POA: Diagnosis not present

## 2018-11-09 DIAGNOSIS — E559 Vitamin D deficiency, unspecified: Secondary | ICD-10-CM

## 2018-11-09 LAB — HEPATIC FUNCTION PANEL
ALT: 9 U/L (ref 0–53)
AST: 11 U/L (ref 0–37)
Albumin: 4 g/dL (ref 3.5–5.2)
Alkaline Phosphatase: 45 U/L (ref 39–117)
Bilirubin, Direct: 0.1 mg/dL (ref 0.0–0.3)
Total Bilirubin: 0.5 mg/dL (ref 0.2–1.2)
Total Protein: 6.3 g/dL (ref 6.0–8.3)

## 2018-11-09 LAB — POC URINALSYSI DIPSTICK (AUTOMATED)
Bilirubin, UA: NEGATIVE
Glucose, UA: NEGATIVE
Ketones, UA: NEGATIVE
Leukocytes, UA: NEGATIVE
Nitrite, UA: NEGATIVE
Protein, UA: POSITIVE — AB
Spec Grav, UA: 1.02 (ref 1.010–1.025)
Urobilinogen, UA: 0.2 E.U./dL
pH, UA: 6 (ref 5.0–8.0)

## 2018-11-09 LAB — CBC WITH DIFFERENTIAL/PLATELET
Basophils Absolute: 0.1 10*3/uL (ref 0.0–0.1)
Basophils Relative: 0.9 % (ref 0.0–3.0)
Eosinophils Absolute: 0.2 10*3/uL (ref 0.0–0.7)
Eosinophils Relative: 2.9 % (ref 0.0–5.0)
HCT: 43.9 % (ref 39.0–52.0)
Hemoglobin: 14.7 g/dL (ref 13.0–17.0)
Lymphocytes Relative: 35.3 % (ref 12.0–46.0)
Lymphs Abs: 2.2 10*3/uL (ref 0.7–4.0)
MCHC: 33.5 g/dL (ref 30.0–36.0)
MCV: 94.2 fl (ref 78.0–100.0)
Monocytes Absolute: 0.5 10*3/uL (ref 0.1–1.0)
Monocytes Relative: 7.4 % (ref 3.0–12.0)
Neutro Abs: 3.3 10*3/uL (ref 1.4–7.7)
Neutrophils Relative %: 53.5 % (ref 43.0–77.0)
Platelets: 191 10*3/uL (ref 150.0–400.0)
RBC: 4.65 Mil/uL (ref 4.22–5.81)
RDW: 12.9 % (ref 11.5–15.5)
WBC: 6.1 10*3/uL (ref 4.0–10.5)

## 2018-11-09 LAB — BASIC METABOLIC PANEL
BUN: 22 mg/dL (ref 6–23)
CO2: 24 mEq/L (ref 19–32)
Calcium: 9.4 mg/dL (ref 8.4–10.5)
Chloride: 103 mEq/L (ref 96–112)
Creatinine, Ser: 1.37 mg/dL (ref 0.40–1.50)
GFR: 50.15 mL/min — ABNORMAL LOW (ref >60.00)
Glucose, Bld: 135 mg/dL — ABNORMAL HIGH (ref 70–99)
Potassium: 4.6 mEq/L (ref 3.5–5.1)
Sodium: 136 mEq/L (ref 135–145)

## 2018-11-09 LAB — LIPID PANEL
Cholesterol: 191 mg/dL (ref 0–200)
HDL: 45.2 mg/dL (ref >39.00)
LDL Cholesterol: 127 mg/dL — ABNORMAL HIGH (ref 0–99)
NonHDL: 145.59
Total CHOL/HDL Ratio: 4
Triglycerides: 95 mg/dL (ref 0.0–149.0)
VLDL: 19 mg/dL (ref 0.0–40.0)

## 2018-11-09 LAB — PSA: PSA: 1.52 ng/mL (ref 0.10–4.00)

## 2018-11-09 LAB — VITAMIN D 25 HYDROXY (VIT D DEFICIENCY, FRACTURES): VITD: 110.84 ng/mL (ref 30.00–100.00)

## 2018-11-09 LAB — TSH: TSH: 1.83 u[IU]/mL (ref 0.35–4.50)

## 2018-11-09 LAB — HEMOGLOBIN A1C: Hgb A1c MFr Bld: 6.8 % — ABNORMAL HIGH (ref 4.6–6.5)

## 2018-11-14 ENCOUNTER — Ambulatory Visit: Payer: Medicare HMO | Admitting: Neurology

## 2018-11-14 ENCOUNTER — Encounter: Payer: Self-pay | Admitting: Neurology

## 2018-11-14 ENCOUNTER — Other Ambulatory Visit: Payer: Self-pay

## 2018-11-14 VITALS — BP 170/82 | HR 52 | Temp 97.9°F | Resp 12 | Ht 69.0 in | Wt 201.0 lb

## 2018-11-14 DIAGNOSIS — G20 Parkinson's disease: Secondary | ICD-10-CM | POA: Diagnosis not present

## 2018-11-14 DIAGNOSIS — G3184 Mild cognitive impairment, so stated: Secondary | ICD-10-CM | POA: Diagnosis not present

## 2018-11-14 NOTE — Progress Notes (Signed)
NEUROLOGY FOLLOW UP OFFICE NOTE  Joe Murray [id number] 01-Jan-1941  HISTORY OF PRESENT ILLNESS: I had the pleasure of seeing Joe Murray in follow-up in the neurology clinic on 11/14/2018.  The patient was last seen 7 months ago for memory loss. He is again accompanied by his wife who helps supplement the history today. MOCA score 25/30 in February 2020. Records and images were personally reviewed where available. TSH normal, B12 low normal 312. He was instructed to start B12 supplements. I personally reviewed MRI brain without contrast done 03/2018 which did not show any acute changes. There was mild to moderate diffuse atrophy and chronic microvascular disease. On his last visit, he had some parkinsonian features on exam (masked facies, reduced arm swing). His wife feels that his gait is better, not like it was, but he still takes short steps. He was unable to do PT due to pandemic. She feels his memory is better than last visit. She feels this is due to the medication changes made by his other physicians. He feels his memory is the same. He continues to drive and denies getting lost. He manages his own medications. His wife manages finances. He has occasional difficulties on which button to press on the TV remote control, like he is not quite sure. She notices occasional tremor in his left hand, and has noticed his hands are weaker with cutting meats and with buttons. He walks "extremely careful," with no falls. He denies any headaches, dizziness, vision changes, focal numbness/tingling/weakness. Sleep is good. No paranoia or hallucinations.   History on Initial Assessment 04/06/2018: This is a 78 year old right-handed man with a history of hypertension, hyperlipidemia, CAD, diabetes, presenting for evaluation of memory loss. He reports his memory is lousy. He started noticing memory changes around a year ago. His wife feels memory changes started a few years ago which she attributes to statin  use, however in the past year, forgetfulness has worsened. He watches a lot of Westerns but would watch the same show 1-2 weeks later, his wife reminds him he has seen it already but he denies it. He forgets conversations. He does not remember people from the past, someone would pass away and he would not remember then. No significant difficulties following directions/instructions. His wife has not noticed significant memory decline in the past year. He drives minimally due to vision issues, and denies getting lost. His wife manages finances. He was previously on several medications that were stopped 2 months ago due to fatigue and memory concerns. He is only taking glipizide. He had muscle pains on statin in the past, his wife feels the Repatha helped improve his cholesterol levels but also caused leg pain. He reports right leg pain mostly after walking a few steps, improving when he rests. His wife has noticed slowed movements, he would be very slow when opening things. He has a little trouble getting out of a chair and feels his balance is off when he first stands, but feels fine once he starts walking. His wife  has noticed over the past year that gait has slowed down and his stride has been shorter. He speaks in a low voice and does not move his mouth much when talking, his wife reports he has always been this way. No major personality changes, no paranoia or hallucinations. No family history of dementia, no history of significant head injuries. He drinks alcohol once a week.  He has had headaches over the past year localized over the  back of his left ear. He describes a throbbing pain that only occurs in the evening around 30-40 minutes after he sits in his recliner. It would start wearing off after a few hours and resolves by bedtime. He takes Tylenol every evening. No associated nausea/vomiting, photo/phonophobia. He has trouble seeing the words on TV and reports monocular diplopia in his right eye. He  denies any dizziness, dysarthria/dysphagia, neck/back pain, focal numbness/tingling, bowel/bladder dysfunction, anosmia, no falls. Sleep is good, he has some daytime drowsiness and snores minimally. No REM behavior disorder noted by wife. He has had occasional left hand tremors the past year, he denies any difficulty writing with his right hand or using utensils. He is a retired Systems developer.     PAST MEDICAL HISTORY: Past Medical History:  Diagnosis Date  . CAD S/P percutaneous coronary angioplasty 10/'03; 3/'09; 7/*09; 1/'17    a. Dr. Ellyn Hack; '03 - Cypher DES 3.0 mm 33 mm proximal-mid LAD (details D1 with ostial 60%); 3/'09 dRCA 2.75 mm x 13 mm Cypher DES (2.8 mm); 7/'09 pRCA 3.0 mm x 18 m Cypher DES (3.25 mm);; (2009) 2D Echo - EF 55%, (November 2014) nonischemic Myoview;; b. Promus DES to RCA and mid LAD 02/26/2015  . Chronotropic incompetence 12/2011   ETT 11/2017: Normal blood pressure response.  No EKG changes.  Exercise stopped due to fatigue and dyspnea.  Heart rate increased to low 90s from 50s.  Suggest chronotropic incompetence.  Severely impaired exercise capacity. -->  Results reviewed with electrophysiology: Still not indication for pacemaker.  . Diabetes mellitus type 2 in obese (HCC)    CAD  . Diverticulosis   . Dyslipidemia, goal LDL below 70    on [employer] study medicaion.  . ED (erectile dysfunction)   . GERD (gastroesophageal reflux disease)   . Hemorrhoids 07/2002   Internal and External  . Insomnia   . Non-STEMI (non-ST elevated myocardial infarction) ([facility]) 11/2001   a. Proximal LAD tandem lesions -- long DES stent covering both;; b. Jan 2017: mRCA PCI, Carbon PCI  . Persistent sinus bradycardia   . Personal history of colonic adenomas 09/06/2012  . Psoriasis    Resting HR in 54s  . Spasm of esophagus   . SVT (supraventricular tachycardia) ([facility]) 2003  . Vertigo, benign positional     MEDICATIONS: Current Outpatient Medications on File Prior to Visit   Medication Sig Dispense Refill  . Cholecalciferol (VITAMIN D3) 5000 UNITS CAPS Take 5,000 Units by mouth daily. 30 capsule 0  . Cinnamon 500 MG capsule Take 2,000 mg by mouth daily.     . Evolocumab (REPATHA SURECLICK) [id number] MG/ML SOAJ Inject 140 mg into the skin every 14 (fourteen) days. 2 pen 12  . fish oil-omega-3 fatty acids 1000 MG capsule Take 2 g by mouth daily.      Marland Kitchen glipiZIDE (GLUCOTROL) 5 MG tablet Take 1 tablet (5 mg total) by mouth 2 (two) times daily before a meal. 180 tablet 3  . Lancets (BD LANCET ULTRAFINE 30G) MISC USE ONE  TO CHECK GLUCOSE ONCE DAILY 100 each 1  . MAGNESIUM CHLORIDE PO Take 1 tablet by mouth daily.      . meclizine (ANTIVERT) 25 MG tablet TAKE ONE TABLET BY MOUTH THREE TIMES DAILY AS NEEDED FOR DIZZINESS (Patient not taking: Reported on 11/04/2018) 90 tablet 0  . methocarbamol (ROBAXIN) 500 MG tablet Take 1 tablet (500 mg total) by mouth every 6 (six) hours as needed (neck pain). (Patient not taking: Reported on 11/04/2018) 60  tablet 2  . nitroGLYCERIN (NITROSTAT) 0.4 MG SL tablet PLACE 1 TABLET UNDER THE TONGUE EVERY 5 MINUTES FOR 3 DOSES AS NEEDED FOR CHEST PAIN 25 tablet 3  . ONE TOUCH ULTRA TEST test strip USE ONE STRIP TO CHECK GLUCOSE ONCE DAILY 50 each 0  . potassium gluconate 595 MG TABS tablet Take 595 mg by mouth.     No current facility-administered medications on file prior to visit.     ALLERGIES: Allergies  Allergen Reactions  . Beta Adrenergic Blockers Other (See Comments)    chronotropic incompetence  . Praluent [Alirocumab]     Diffuse Rash - improved when stopping & taking Benadryl  . Sulfonamide Derivatives     unknown  . Statins Other (See Comments)    REACTION: myalgias    FAMILY HISTORY: Family History  Problem Relation Age of Onset  . Diabetes Mother   . Hearing loss Father   . Dementia Father   . Heart attack Father 16       first MI prior to age 82  . Heart attack Brother     SOCIAL HISTORY: Social History    Socioeconomic History  . Marital status: Married    Spouse name: Not on file  . Number of children: 4  . Years of education: Not on file  . Highest education level: Not on file  Occupational History  . Occupation: retired  Scientific laboratory technician  . Financial resource strain: Not on file  . Food insecurity    Worry: Not on file    Inability: Not on file  . Transportation needs    Medical: Not on file    Non-medical: Not on file  Tobacco Use  . Smoking status: Current Every Day Smoker    Types: Cigars    Start date: 07/06/1952  . Smokeless tobacco: Never Used  . Tobacco comment: 1-2 per day; he says "I really don't inhale"  Substance and Sexual Activity  . Alcohol use: Yes    Alcohol/week: 0.0 standard drinks    Comment: occ  . Drug use: No  . Sexual activity: Never  Lifestyle  . Physical activity    Days per week: Not on file    Minutes per session: Not on file  . Stress: Not on file  Relationships  . Social Herbalist on phone: Not on file    Gets together: Not on file    Attends religious service: Not on file    Active member of club or organization: Not on file    Attends meetings of clubs or organizations: Not on file    Relationship status: Not on file  . Intimate partner violence    Fear of current or ex partner: Not on file    Emotionally abused: Not on file    Physically abused: Not on file    Forced sexual activity: Not on file  Other Topics Concern  . Not on file  Social History Narrative   He is a married, father of 38, grandfather 3.   He still smokes 2-3 cigars per day. States that he "does not really inhale ". He is not really all that it's including, stating that he wants to have his 1 remaining vice.   Exercises only on occasion.   He does various landscaping jobs including cutting wood, and clearing brush.      Pt is right handed   Lives in 2 story home with his wife   High school graduate  Retired Dealer    REVIEW OF SYSTEMS:  Constitutional: No fevers, chills, or sweats, no generalized fatigue, change in appetite Eyes: No visual changes, double vision, eye pain Ear, nose and throat: No hearing loss, ear pain, nasal congestion, sore throat Cardiovascular: No chest pain, palpitations Respiratory:  No shortness of breath at rest or with exertion, wheezes GastrointestinaI: No nausea, vomiting, diarrhea, abdominal pain, fecal incontinence Genitourinary:  No dysuria, urinary retention or frequency Musculoskeletal:  No neck pain, back pain Integumentary: No rash, pruritus, skin lesions Neurological: as above Psychiatric: No depression, insomnia, anxiety Endocrine: No palpitations, fatigue, diaphoresis, mood swings, change in appetite, change in weight, increased thirst Hematologic/Lymphatic:  No anemia, purpura, petechiae. Allergic/Immunologic: no itchy/runny eyes, nasal congestion, recent allergic reactions, rashes  PHYSICAL EXAM: Vitals:   11/14/18 1529  BP: (!) 164/90  Pulse: (!) 52  Resp: 12  Temp: 97.9 F (36.6 C)  SpO2: 98%   General: No acute distress, masked facies Head:  Normocephalic/atraumatic Skin/Extremities: No rash, no edema Neurological Exam: alert and oriented to person, place, and time. No aphasia or dysarthria. Fund of knowledge is reduced.  Recent and remote memory are impaired.  Attention and concentration are normal.    Able to name objects and repeat phrases.  Montreal Cognitive Assessment Blind 11/11/2018  Attention: Read list of digits (0/2) 2  Attention: Read list of letters (0/1) 1  Attention: Serial 7 subtraction starting at 100 (0/3) 3  Language: Repeat phrase (0/2) 1  Language : Fluency (0/1) 0  Abstraction (0/2) 1  Delayed Recall (0/5) 0  Orientation (0/6) 5  Total 13  Adjusted Score (based on education) 14/22   Cranial nerves: Pupils equal, round, reactive to light. Extraocular movements intact with no nystagmus. Visual fields full. Facial sensation intact. No facial  asymmetry. Tongue, uvula, palate midline.  Motor: Bulk and tone normal, minimal cogwheeling L>R, muscle strength 5/5 throughout with no pronator drift. Finger to nose testing intact.  Gait slow and cautious with hunched posture, reduced arm swing, and tremor on fingers of left hand. He has a resting tremor of fingers on his left hand (2nd,3rd, 4th digits). He has difficulty rising from chair with arms over chest. Negative pull test. Slight sway with Romberg test. Good finger and foot taps, bradykinetic with rapid alternating movements   IMPRESSION: This is a 78 yo RH man with a history of hypertension, hyperlipidemia, diabetes, CAD, with mild cognitive impairment. MOCA blind (done over phone) 14/22 ([place] 25/30 in February 2020. He and his wife feel memory has been stable (if not better) over the past few months, and would like to hold off on starting medication such as Memantine (would avoid Donepezil due to bradycardia). B12 level low normal, advised to start a daily 569mcg B12 supplement. He has more parkinsonian features today with left hand resting tremor, reduced arm swing, more bradykinesia. Findings discussed with patient and wife about concern for Parkinson's disease. MRI brain unremarkable, mild to moderate chronic microvascular disease. We discussed the option of starting Sinemet, they would like to hold off and will call for any changes. He was encouraged to increase exercise and physical activity. They would like to wait for pandemic to quiet down before doing Balance therapy. Continue to monitor driving. He will follow-up in 6 months and knows to call for any changes  Thank you for allowing me to participate in his care.  Please do not hesitate to call for any questions or concerns.  The duration of this appointment visit was 30  minutes of face-to-face time with the patient.  Greater than 50% of this time was spent in counseling, explanation of diagnosis, planning of further management, and  coordination of care.   Ellouise Newer, M.D.   CC: Dr. Sarajane Jews

## 2018-11-14 NOTE — Patient Instructions (Signed)
1. Restart vitamin B12 522mcg supplement  2. Follow-up in 6 months, call for any changes  FALL PRECAUTIONS: Be cautious when walking. Scan the area for obstacles that may increase the risk of trips and falls. When getting up in the mornings, sit up at the edge of the bed for a few minutes before getting out of bed. Consider elevating the bed at the head end to avoid drop of blood pressure when getting up. Walk always in a well-lit room (use night lights in the walls). Avoid area rugs or power cords from appliances in the middle of the walkways. Use a walker or a cane if necessary and consider physical therapy for balance exercise. Get your eyesight checked regularly.  FINANCIAL OVERSIGHT: Supervision, especially oversight when making financial decisions or transactions is also recommended.  HOME SAFETY: Consider the safety of the kitchen when operating appliances like stoves, microwave oven, and blender. Consider having supervision and share cooking responsibilities until no longer able to participate in those. Accidents with firearms and other hazards in the house should be identified and addressed as well.  DRIVING: Regarding driving, in patients with progressive memory problems, driving will be impaired. We advise to have someone else do the driving if trouble finding directions or if minor accidents are reported. Independent driving assessment is available to determine safety of driving.  ABILITY TO BE LEFT ALONE: If patient is unable to contact 911 operator, consider using LifeLine, or when the need is there, arrange for someone to stay with patients. Smoking is a fire hazard, consider supervision or cessation. Risk of wandering should be assessed by caregiver and if detected at any point, supervision and safe proof recommendations should be instituted.  MEDICATION SUPERVISION: Inability to self-administer medication needs to be constantly addressed. Implement a mechanism to ensure safe  administration of the medications.  RECOMMENDATIONS FOR ALL PATIENTS WITH MEMORY PROBLEMS: 1. Continue to exercise (Recommend 30 minutes of walking everyday, or 3 hours every week) 2. Increase social interactions - continue going to [place] and enjoy social gatherings with friends and family 3. Eat healthy, avoid fried foods and eat more fruits and vegetables 4. Maintain adequate blood pressure, blood sugar, and blood cholesterol level. Reducing the risk of stroke and cardiovascular disease also helps promoting better memory. 5. Avoid stressful situations. Live a simple life and avoid aggravations. Organize your time and prepare for the next day in anticipation. 6. Sleep well, avoid any interruptions of sleep and avoid any distractions in the bedroom that may interfere with adequate sleep quality 7. Avoid sugar, avoid sweets as there is a strong link between excessive sugar intake, diabetes, and cognitive impairment The Mediterranean diet has been shown to help patients reduce the risk of progressive memory disorders and reduces cardiovascular risk. This includes eating fish, eat fruits and green leafy vegetables, nuts like almonds and hazelnuts, walnuts, and also use olive oil. Avoid fast foods and fried foods as much as possible. Avoid sweets and sugar as sugar use has been linked to worsening of memory function.  There is always a concern of gradual progression of memory problems. If this is the case, then we may need to adjust level of care according to patient needs. Support, both to the patient and caregiver, should then be put into place.

## 2018-11-15 ENCOUNTER — Telehealth: Payer: Self-pay

## 2018-11-15 NOTE — Telephone Encounter (Signed)
Pt spouse stated that he saw another Dr. Wilburn Mylar and pt Bp was 170/-, 180/72, and 144/71 this a.m when he woke up. Pt Bp is now 169/70. Pt spouse stated that they did change coffee but she doesn't know if this is the cause. Pt blood pressure and heart rate now is 131/67 heart rate is 49.  Pt was advised that  I will speak to Dr.Fry for advise.

## 2018-11-15 NOTE — Telephone Encounter (Signed)
Copied from [place] at [facility] [phone]. Topic: General - Other >> Nov 15, 2018  3:32 PM Keene Breath wrote: Reason for CRM: Patient's wife called to inform the doctor that his BP has been running a little high.  Wife would like the nurse or doctor to call to discuss.  CB# [phone]

## 2018-11-15 NOTE — Telephone Encounter (Signed)
Pt spouse notified that Dr.Fry verbally advised that pt maybe having anxiety since the diastolic is not elevated. Spouse was also advised to watch and record pt next Bp readings. Spouse verbalized understanding and stated that she doesn't think he has anxiety but she will watch the Bp. Spouse also advised not to give pt anymore amlodipine. Spouse informed me that she gave pt amlodipine. Pt was taking off in the past but still had some on hand. Spouse was advised to take the pt to the local ED or UC if sx worsen or persist. Spouse verbalized understanding.

## 2018-11-17 DIAGNOSIS — R69 Illness, unspecified: Secondary | ICD-10-CM | POA: Diagnosis not present

## 2018-11-17 NOTE — Telephone Encounter (Signed)
FYI  Spoke to pt 120/81 HR at 50. Pt also wanted to add blood sugar reading which was 150.

## 2018-11-17 NOTE — Telephone Encounter (Signed)
Message routed to PCP.

## 2018-11-30 ENCOUNTER — Ambulatory Visit: Payer: Medicare HMO | Admitting: Cardiology

## 2018-11-30 ENCOUNTER — Encounter: Payer: Self-pay | Admitting: Cardiology

## 2018-11-30 ENCOUNTER — Other Ambulatory Visit: Payer: Self-pay

## 2018-11-30 VITALS — BP 104/71 | HR 51 | Ht 70.0 in | Wt 196.0 lb

## 2018-11-30 DIAGNOSIS — Z9861 Coronary angioplasty status: Secondary | ICD-10-CM

## 2018-11-30 DIAGNOSIS — I10 Essential (primary) hypertension: Secondary | ICD-10-CM | POA: Diagnosis not present

## 2018-11-30 DIAGNOSIS — I2510 Atherosclerotic heart disease of native coronary artery without angina pectoris: Secondary | ICD-10-CM

## 2018-11-30 DIAGNOSIS — E785 Hyperlipidemia, unspecified: Secondary | ICD-10-CM | POA: Diagnosis not present

## 2018-11-30 DIAGNOSIS — I4589 Other specified conduction disorders: Secondary | ICD-10-CM | POA: Diagnosis not present

## 2018-11-30 DIAGNOSIS — R5382 Chronic fatigue, unspecified: Secondary | ICD-10-CM

## 2018-11-30 MED ORDER — AMLODIPINE BESYLATE 5 MG PO TABS: 5.0000 mg | ORAL_TABLET | ORAL | 3 refills | Status: DC | PRN

## 2018-11-30 NOTE — Progress Notes (Signed)
PCP: Laurey Morale, MD  Clinic Note: Chief Complaint  Patient presents with   Follow-up    Annual.  Feels better.  On less medicines   Coronary Artery Disease   Dementia    Slow progression.    HPI:     Joe Murray is a 78 y.o. male with a PMH of CAD having PCI as well as hypertension, hyperlipidemia, DM 2 and now dementia who presents today for a 36-month follow-up.   CAD: Non-STEMI-PCI..-- s/p DES to mid RCA and DES to mid LAD on 02/26/2015  STATON BYFIELD was last seen in December 2019.  He was clearly having slow cognitive decline showing signs of dementia - c/o fatigue.  He had just been evaluated by electrophysiology did not feel like he was a candidate for pacemaker (exercise tolerance test in October 2019 showed reduced exercise capacity, but able to increase heart rate from 50s to 90s -> not thought to be significant enough for PPM).  Blood pressure looks good off of amlodipine and Imdur.   --> We also stopped Brilinta and reverted to baby aspirin.   --> Stable on Repatha, off of statin --> but he chose to stop taking it. --> No beta-blocker or even amlodipine because of fatigue and bradycardia issues --> Plan was to avoid aggressive medications to allow for better quality of life. -- Smoking: -- still smoking ~2 cigars/day --> no intention to quit.  Recent Hospitalizations: None  PCP follow-up in September: Memory loss stable.  Hypertension GERD stable.  He stopped Repatha noting mild memory improvement.  Has been referred to neurology.  CV Procedures/ Studies Reviewed  - (if available, images/films reviewed: From Epic Chart or Care Everywhere)  None since ETT in October 2019   Interval History   Joe Murray is here today with his wife which is thankful because he is not a great historian.  He is relatively slow to answer questions, but actually seems to be doing better today.  He still complains of his being fatigued all the time, but he says that he is little  bit better with being off medications.  Memory issues seems to be a little bit more stable not being on any cholesterol medicine, his wife is worried about him not being on Repatha.  He is very happy to not be on any blood thinners besides aspirin and essentially not on any blood pressure medications.  He does say that now that his energy is better he does the treadmill about 30 minutes a day and does the bike and [employer] as well at least 3 days a week.  With his level of activity, he has not noted any chest pain or pressure.  He may get short of breath near the end of his exercise, but nothing unusual and in fact he is doing better now than he had been last year.  He still has a little orthopnea and occasional symptoms that sound like maybe PND but not routine.  Intermittent edema. No claudication, probably because he does not walk enough.  No chest pain or shortness of breath with rest or exertion. No PND, orthopnea or edema. No palpitations, lightheadedness, dizziness, weakness or syncope/near syncope. No TIA/amaurosis fugax symptoms. No melena, hematochezia, hematuria, or epstaxis. No claudication.  His blood pressures have been pretty labile today they are 104/71, but not that long ago they were in the [id number] systolic.  They were concerned about the high blood pressures, but I also reiterated the low blood  pressures and low heart rate and that I am leery of treating the high pressures.  He has not had any symptoms in all headache or blurred vision with high blood pressures.  The patient does not have symptoms concerning for COVID-19 infection (fever, chills, cough, or new shortness of breath).  The patient is practicing social distancing, and masking.  They do go to the grocery store/shopping but not frequently.  No restaurants, just   COVID-19 Education: The signs and symptoms of COVID-19 were discussed with the patient and how to seek care for testing (follow up with PCP or arrange  E-visit).   The importance of social distancing was discussed today.   REVIEW OF SYMPTOMS   A comprehensive ROS was performed. Review of Systems  Constitutional: Positive for malaise/fatigue (Pretty much chronic, but better.  Energy has improved with being off medications.) and weight loss (His weight is been up and down a little bit but overall has been a net weight loss.).  HENT: Positive for hearing loss. Negative for congestion and nosebleeds.   Respiratory: Positive for shortness of breath (Per HPI.  With modest exertion). Negative for cough and wheezing.   Gastrointestinal: Positive for constipation (Has been relatively normal of late). Negative for abdominal pain, blood in stool, heartburn, melena and vomiting.  Genitourinary: Negative for hematuria.  Musculoskeletal: Positive for back pain and joint pain. Negative for falls.  Neurological: Positive for dizziness (Much better), tingling (Neuropathy tingling in his hands and feet) and weakness (Global.  His leg is mostly give out on him and he just feels tired with walking). Negative for headaches.  Psychiatric/Behavioral: Positive for memory loss. Negative for depression. The patient is not nervous/anxious and does not have insomnia.     I have reviewed and (if needed) personally updated the patient's problem list, medications, allergies, past medical and surgical history, social and family history.    PAST MEDICAL HISTORY    Past Medical History:  Diagnosis Date   CAD S/P percutaneous coronary angioplasty 10/'03; 3/'09; 7/*09; 1/'17    a. Dr. Ellyn Hack; '03 - Cypher DES 3.0 mm 33 mm proximal-mid LAD (details D1 with ostial 60%); 3/'09 dRCA 2.75 mm x 13 mm Cypher DES (2.8 mm); 7/'09 pRCA 3.0 mm x 18 m Cypher DES (3.25 mm);; (2009) 2D Echo - EF 55%, (November 2014) nonischemic Myoview;; b. Promus DES to RCA and mid LAD 02/26/2015   Chronotropic incompetence 12/2011   ETT 11/2017: Normal blood pressure response.  No EKG changes.   Exercise stopped due to fatigue and dyspnea.  Heart rate increased to low 90s from 50s.  Suggest chronotropic incompetence.  Severely impaired exercise capacity. -->  Results reviewed with electrophysiology: Still not indication for pacemaker.   Diabetes mellitus type 2 in obese [facility])    CAD   Diverticulosis    Dyslipidemia, goal LDL below 70    on [employer] study medicaion.   ED (erectile dysfunction)    GERD (gastroesophageal reflux disease)    Hemorrhoids 07/2002   Internal and External   Insomnia    Non-STEMI (non-ST elevated myocardial infarction) ([place]) 11/2001   a. Proximal LAD tandem lesions -- long DES stent covering both;; b. Jan 2017: mRCA PCI, mLAD PCI   Persistent sinus bradycardia    Personal history of colonic adenomas 09/06/2012   Psoriasis    Resting HR in 54s   Spasm of esophagus    SVT (supraventricular tachycardia) ([place]) 2003   Vertigo, benign positional     PAST SURGICAL /  PROCEDURE HISTORY   Past Surgical History:  Procedure Laterality Date   CARDIAC CATHETERIZATION N/A 02/26/2015   Procedure: Left Heart Cath and Coronary Angiography;  Surgeon: Leonie Man, MD: mRCA 99% --> PCI. mLAD 75%-> FFR Guided PCI, Mod ISR in pLAD & pRCA   CARDIAC CATHETERIZATION N/A 02/26/2015   Procedure: Coronary Stent Intervention;  Surgeon: Leonie Man, MD;  Location: [facility] INVASIVE CV LAB: mRCA Promus Premier DES 3.0 x 12 (3.5), mLAD Promus Premier DES 2.75 x 20 (3.0)   CARDIAC CATHETERIZATION N/A 02/26/2015   Procedure: Intravascular Pressure Wire/FFR Study;  Surgeon: Leonie Man, MD;  Location: [facility] CV LAB;  Service: Cardiovascular: LAD 75% - FFR + -> PCI   CATARACT EXTRACTION  LE:[id number]   COLONOSCOPY  12/16/2017   per Dr. Carlean Purl, adenomatous polyps, no repeats due to age    Imperial  LAD - 2003, RCA 3 & 7/ '09   Cypher 3.0 x 32 mid LAD; Cyper 2.75 x 13 - distal RCA, 3.0 x 18 Prox RCA   ESOPHAGOGASTRODUODENOSCOPY   08-11-02   esophageal dilation per Dr. Carlean Purl   HERNIA REPAIR     right inguinal    NM MYOVIEW LTD  Nov 2014   ~8 METS; EF 60%, no ischemia or infarction   PERCUTANEOUS CORONARY STENT INTERVENTION (PCI-S)  11/23/2001   NSTEMI: Prox-Mid LAD tandem ~80% lesions on either side of D1 (with 80% lesion) -- Cypher DES 3.0 mm x 33 m (covering both lesions)    PERCUTANEOUS CORONARY STENT INTERVENTION (PCI-S)  04/27/2007   Unstable Angina: Distal RCA 95%: Cypher 2.75x13  (2.8 mm); residual focal ~60%ISR in LAD stent, 60% D1 ostial (no PTCA on LAD due to no ischemia on ST)   PERCUTANEOUS CORONARY STENT INTERVENTION (PCI-S)  09/21/2007   Bradycardia & Unstable Angina: Prox RCA 70% - PCI Cypher DES 3.0 mm x 18 mm  (3.25 mm); ostial 60-70% jailed D1. LAD & distal RCA stents patent   SHOULDER SURGERY     left rotator cuff, Dr. Gladstone Lighter   TRANSTHORACIC ECHOCARDIOGRAM  March 2009   Normal LV size and function, EF 55%. Mild MR; mild RV dilation.   TRANSTHORACIC ECHOCARDIOGRAM  02/2015   EF 60-65%. Moderate concentric LVH. Normal function with normal regional wall motion. GR 1 DD   WRIST FRACTURE SURGERY     right    MEDICATIONS / ALLERGIES    Current Meds  Medication Sig   aspirin EC 81 MG tablet Take 81 mg by mouth daily.   Cholecalciferol (VITAMIN D3) 5000 UNITS CAPS Take 5,000 Units by mouth daily.   Cinnamon 500 MG capsule Take 2,000 mg by mouth daily.    fish oil-omega-3 fatty acids 1000 MG capsule Take 2 g by mouth daily.     glipiZIDE (GLUCOTROL) 5 MG tablet Take 1 tablet (5 mg total) by mouth 2 (two) times daily before a meal.   Lancets (BD LANCET ULTRAFINE 30G) MISC USE ONE  TO CHECK GLUCOSE ONCE DAILY   MAGNESIUM CHLORIDE PO Take 1 tablet by mouth daily.     meclizine (ANTIVERT) 25 MG tablet TAKE ONE TABLET BY MOUTH THREE TIMES DAILY AS NEEDED FOR DIZZINESS   nitroGLYCERIN (NITROSTAT) 0.4 MG SL tablet PLACE 1 TABLET UNDER THE TONGUE EVERY 5 MINUTES FOR 3 DOSES AS NEEDED FOR  CHEST PAIN   ONE TOUCH ULTRA TEST test strip USE ONE STRIP TO CHECK GLUCOSE ONCE DAILY   potassium gluconate 595 MG TABS tablet Take  595 mg by mouth.   [DISCONTINUED] Evolocumab (REPATHA SURECLICK) [id number] MG/ML SOAJ Inject 140 mg into the skin every 14 (fourteen) days.   [DISCONTINUED] methocarbamol (ROBAXIN) 500 MG tablet Take 1 tablet (500 mg total) by mouth every 6 (six) hours as needed (neck pain).    Allergies  Allergen Reactions   Beta Adrenergic Blockers Other (See Comments)    chronotropic incompetence   Praluent [Alirocumab]     Diffuse Rash - improved when stopping & taking Benadryl   Sulfonamide Derivatives     unknown   Statins Other (See Comments)    REACTION: myalgias    SOCIAL / FAMILY HISTORY    Social History   Tobacco Use   Smoking status: Current Every Day Smoker    Types: Cigars    Start date: 07/06/1953   Smokeless tobacco: Never Used   Tobacco comment: 1-2 per day; he says "I really don't inhale"  Substance Use Topics   Alcohol use: Yes    Alcohol/week: 0.0 standard drinks    Comment: occ   Drug use: No   Social History   Social History Narrative   He is a married, father of 53, grandfather 3.   He still smokes 2-3 cigars per day. States that he "does not really inhale ". He is not really all that it's including, stating that he wants to have his 1 remaining vice.   Exercises only on occasion.   He does various landscaping jobs including cutting wood, and clearing brush.      Pt is right handed   Lives in 2 story home with his wife   High school graduate   Retired Dealer    family history includes Dementia in his father; Diabetes in his mother; Hearing loss in his father; Heart attack in his brother; Heart attack (age of onset: 81) in his father.  Wt Readings from Last 3 Encounters:  11/30/18 196 lb (88.9 kg)  11/14/18 201 lb (91.2 kg)  11/04/18 195 lb 9.6 oz (88.7 kg)    OBJECTIVE    PHYSICAL EXAM BP 104/71    Pulse (!) 51     Ht 5\' 10"  (1.778 m)    Wt 196 lb (88.9 kg)    SpO2 97%    BMI 28.12 kg/m  Physical Exam  Constitutional: He is oriented to person, place, and time. He appears well-developed and well-nourished. No distress.  Relatively healthy-appearing elderly gentleman.  No acute distress.  Appears as stated age.  Well-groomed.  HENT:  Head: Normocephalic and atraumatic.  Very hard of hearing  Neck: Normal range of motion. Neck supple. No hepatojugular reflux and no JVD present. Carotid bruit is not present.  Cardiovascular: Regular rhythm and normal heart sounds.  Occasional extrasystoles are present. Bradycardia present. PMI is not displaced. Exam reveals decreased pulses (Mildly decreased pedal pulses but otherwise normal). Exam reveals no gallop and no friction rub.  No murmur heard. Pulmonary/Chest: Effort normal and breath sounds normal. No respiratory distress. He has no wheezes. He has no rales.  Abdominal: Soft. Bowel sounds are normal. He exhibits no distension. There is no abdominal tenderness. There is no rebound.  Musculoskeletal: Normal range of motion.        General: Edema (Trivial lower extremity) present.  Neurological: He is alert and oriented to person, place, and time.  Psychiatric: His behavior is normal. Thought content normal.  He has slow mentation, but actually seems to be more alert and able to answer questions today than when I  last saw him. He is in his normal baseline flat affect, but then smiles  Vitals reviewed.   Adult ECG Report  Rate: 51 ;  Rhythm: sinus bradycardia and LAFB.  Cannot rule out anterior MI, age undetermined.  Normal intervals and durations.;   Narrative Interpretation: Relatively stable EKG   Other studies Reviewed: Additional studies/ records that were reviewed today include:  Recent Labs:  No Longer on statin or Repatha Lab Results  Component Value Date   CHOL 191 11/09/2018   HDL 45.20 11/09/2018   LDLCALC 127 (H) 11/09/2018   LDLDIRECT 85.5  06/27/2012   TRIG 95.0 11/09/2018   CHOLHDL 4 11/09/2018   Lab Results  Component Value Date   CREATININE 1.37 11/09/2018   BUN 22 11/09/2018   NA 136 11/09/2018   K 4.6 11/09/2018   CL 103 11/09/2018   CO2 24 11/09/2018    ASSESSMENT / PLAN:    Problem List Items Addressed This Visit    CAD S/P percutaneous coronary angioplasty - LADx2, & RCA x 3 - Primary (Chronic)    History of two-vessel PCI with RCA and LAD stents-January 2017.  Is now only on aspirin.  Because of blood pressure, memory and dizziness issues, is not on any blood pressure medications. Also statin because of memory issues and arthralgias/myalgias. Was on Repatha but that was also stopped because of potential side effects.  --> Wife is interested in restarting Repatha or Praluent      Relevant Medications   amLODipine (NORVASC) 5 MG tablet   Other Relevant Orders   EKG 12-Lead (Completed)   Lipid panel   Comprehensive metabolic panel   Coronary artery disease involving native coronary artery of native heart without angina pectoris (Chronic)    He has chronic exertional fatigue, but no real chest pain or pressure.  Actually symptoms of proved with being off multiple medications.  Is only now on aspirin.  Was intolerant of beta-blocker and has significant sinus bradycardia (not yet ready for PPM), low blood pressure/labile blood pressure precludes use of stated hypertensives. He has been doing pretty well without active angina with his level of activity.  We have been taken off Imdur.  He is actually doing better with physical activity.  Energy improved.      Relevant Medications   amLODipine (NORVASC) 5 MG tablet   Other Relevant Orders   EKG 12-Lead (Completed)   Lipid panel   Comprehensive metabolic panel   Dyslipidemia, goal LDL below 70 (Chronic)    His wife seems to think that he should be on either Repatha or Praluent so we will have [place] team help arrange appropriate medication for  once monthly injections as opposed to every 2 weeks..      Relevant Medications   amLODipine (NORVASC) 5 MG tablet   Other Relevant Orders   Lipid panel   Comprehensive metabolic panel   Chronotropic incompetence (Chronic)    Still meeting criteria for pacemaker on his last GXT evaluation.  Had a relatively slow heart rate responsiveness, was able to get heart rate in the 90s.  No plans for PPM now.  Continue to follow.  Avoid AV nodal agents including beta-blockers, nondihydropyridine calcium channel blockers, clonidine, digoxin.  Also antiarrhythmics.      Relevant Orders   EKG 12-Lead (Completed)   Lipid panel   Comprehensive metabolic panel   Fatigue (Chronic)    Relatively stable now.  Seems to actually be feeling a little better with an energy standpoint  being off medications.  He probably is combination of chronotropic incompetence, some obesity and CAD with bradycardia.  Energy level has improved as we take him off medications.  I tend to think at his age and low level symptoms, but minimizing his medical therapy is prudent.  We will make amlodipine as needed and convert to once monthly Repatha      Essential hypertension (Chronic)    Very labile blood pressures with history of significant orthostatic hypotension.  The most simply use amlodipine as needed for hypertension (blood pressures greater than 170 mmHg and also for chest pain.      Relevant Medications   amLODipine (NORVASC) 5 MG tablet   Other Relevant Orders   Lipid panel   Comprehensive metabolic panel      I spent a total of 46minutes with the patient and chart review. >  50% of the time was spent in direct patient consultation.   Current medicines are reviewed at length with the patient today.  (+/- concerns) --wife to consider restarting Repatha or Praluent  PATIENT INSTRUCTIONS / MEDICATION CHANGES/ STUDIES ORDERED / FOLLOW-UP     Patient Instructions  Medication Instructions:   RESTART REPATHA -  TAKE ONCE  A MONTH INSTEAD EVERY 2 WEEKS  MAY USE Amlodipine  5 MG  IF BLOOD PRESSURE IS ABOVE [id number] SYSTOLIC- TAKE ONE HOUR LATER IF STILL UP THEN TAKE AMLODIPINE     If you need a refill on your cardiac medications before your next appointment, please call your pharmacy.   Lab work: Lipid , CMP  In 6 month - April 2021 Will mail labslip sometime closer to the time.  If you have labs (blood work) drawn today and your tests are completely normal, you will receive your results only by:  [place] (if you have MyChart) OR  A paper copy in the mail If you have any lab test that is abnormal or we need to change your treatment, we will call you to review the results.  Testing/Procedures: Not needed  Follow-Up: At [facility], you and your health needs are our priority.  As part of our continuing mission to provide you with exceptional heart care, we have created designated Provider Care Teams.  These Care Teams include your primary Cardiologist (physician) and Advanced Practice Providers (APPs -  Physician Assistants and Nurse Practitioners) who all work together to provide you with the care you need, when you need it.  You will need a follow up appointment in 12 months-OCT 2021.  Please call our office 2 months in advance to schedule this appointment.  You may see Glenetta Hew, MD or one of the following Advanced Practice Providers on your designated Care Team:    Rosaria Ferries, PA-C  Jory Sims, DNP, ANP  Any Other Special Instructions Will Be Listed Below (If Applicable).    Studies Ordered:   Orders Placed This Encounter  Procedures   Lipid panel   Comprehensive metabolic panel   EKG [id number]      Glenetta Hew, M.D., M.S. Interventional Cardiologist   Pager # [phone] Phone # [phone] [address] [place], [place] [address]   Thank you for choosing Heartcare at [facility]!!

## 2018-11-30 NOTE — Patient Instructions (Addendum)
Medication Instructions:   RESTART REPATHA - TAKE ONCE  A MONTH INSTEAD EVERY 2 WEEKS  MAY USE Amlodipine  5 MG  IF BLOOD PRESSURE IS ABOVE 99991111 SYSTOLIC- TAKE ONE HOUR LATER IF STILL UP THEN TAKE AMLODIPINE     If you need a refill on your cardiac medications before your next appointment, please call your pharmacy.   Lab work: Lipid , CMP  In 6 month - April 2021 Will mail labslip sometime closer to the time.  If you have labs (blood work) drawn today and your tests are completely normal, you will receive your results only by: [place] [facility] MyChart Message (if you have MyChart) OR . A paper copy in the mail If you have any lab test that is abnormal or we need to change your treatment, we will call you to review the results.  Testing/Procedures: Not needed  Follow-Up: At [facility], you and your health needs are our priority.  As part of our continuing mission to provide you with exceptional heart care, we have created designated Provider Care Teams.  These Care Teams include your primary Cardiologist (physician) and Advanced Practice Providers (APPs -  Physician Assistants and Nurse Practitioners) who all work together to provide you with the care you need, when you need it. . You will need a follow up appointment in 12 months-OCT 2021.  Please call our office 2 months in advance to schedule this appointment.  You may see Glenetta Hew, MD or one of the following Advanced Practice Providers on your designated Care Team:   . Rosaria Ferries, PA-C . Jory Sims, DNP, ANP  Any Other Special Instructions Will Be Listed Below (If Applicable).

## 2018-12-01 ENCOUNTER — Telehealth: Payer: Self-pay

## 2018-12-01 MED ORDER — REPATHA PUSHTRONEX SYSTEM 420 MG/3.5ML ~~LOC~~ SOCT: 420.0000 mg | SUBCUTANEOUS | 11 refills | Status: DC

## 2018-12-01 NOTE — Telephone Encounter (Signed)
Called and notified pt approved and rx sent

## 2018-12-05 ENCOUNTER — Encounter: Payer: Self-pay | Admitting: Cardiology

## 2018-12-05 NOTE — Assessment & Plan Note (Signed)
Very labile blood pressures with history of significant orthostatic hypotension.  The most simply use amlodipine as needed for hypertension (blood pressures greater than 170 mmHg and also for chest pain.

## 2018-12-05 NOTE — Assessment & Plan Note (Signed)
Relatively stable now.  Seems to actually be feeling a little better with an energy standpoint being off medications.  He probably is combination of chronotropic incompetence, some obesity and CAD with bradycardia.  Energy level has improved as we take him off medications.  I tend to think at his age and low level symptoms, but minimizing his medical therapy is prudent.  We will make amlodipine as needed and convert to once monthly Repatha

## 2018-12-05 NOTE — Assessment & Plan Note (Signed)
His wife seems to think that he should be on either Repatha or Praluent so we will have [place] team help arrange appropriate medication for once monthly injections as opposed to every 2 weeks.Joe Murray

## 2018-12-05 NOTE — Assessment & Plan Note (Signed)
Still meeting criteria for pacemaker on his last GXT evaluation.  Had a relatively slow heart rate responsiveness, was able to get heart rate in the 90s.  No plans for PPM now.  Continue to follow.  Avoid AV nodal agents including beta-blockers, nondihydropyridine calcium channel blockers, clonidine, digoxin.  Also antiarrhythmics.

## 2018-12-05 NOTE — Assessment & Plan Note (Signed)
History of two-vessel PCI with RCA and LAD stents-January 2017.  Is now only on aspirin.  Because of blood pressure, memory and dizziness issues, is not on any blood pressure medications. Also statin because of memory issues and arthralgias/myalgias. Was on Repatha but that was also stopped because of potential side effects.  --> Wife is interested in restarting Repatha or Praluent

## 2018-12-05 NOTE — Assessment & Plan Note (Addendum)
He has chronic exertional fatigue, but no real chest pain or pressure.  Actually symptoms of proved with being off multiple medications.  Is only now on aspirin.  Was intolerant of beta-blocker and has significant sinus bradycardia (not yet ready for PPM), low blood pressure/labile blood pressure precludes use of stated hypertensives. He has been doing pretty well without active angina with his level of activity.  We have been taken off Imdur.  He is actually doing better with physical activity.  Energy improved.

## 2018-12-06 NOTE — Telephone Encounter (Signed)
Pt has been seen by cardiology.

## 2019-02-03 ENCOUNTER — Telehealth: Payer: Self-pay | Admitting: Cardiology

## 2019-02-03 NOTE — Telephone Encounter (Signed)
Did not need this encounter °

## 2019-02-07 ENCOUNTER — Telehealth: Payer: Self-pay | Admitting: Pharmacist

## 2019-02-07 NOTE — Telephone Encounter (Signed)
[employer] called on Friday. R/O Joe Murray' Repatha  Please follow up

## 2019-02-08 NOTE — Telephone Encounter (Signed)
Spoke w/[employer] and they stated that the repatha pushtronix is good thru 02/23/20

## 2019-02-22 ENCOUNTER — Other Ambulatory Visit: Payer: Self-pay

## 2019-02-22 DIAGNOSIS — Z20822 Contact with and (suspected) exposure to covid-19: Secondary | ICD-10-CM

## 2019-02-22 DIAGNOSIS — Z20828 Contact with and (suspected) exposure to other viral communicable diseases: Secondary | ICD-10-CM | POA: Diagnosis not present

## 2019-02-23 LAB — NOVEL CORONAVIRUS, NAA: SARS-CoV-2, NAA: NOT DETECTED

## 2019-02-28 ENCOUNTER — Telehealth: Payer: Self-pay | Admitting: General Practice

## 2019-02-28 NOTE — Telephone Encounter (Signed)
Negative COVID results given. Patient results "NOT Detected." Caller expressed understanding. ° °

## 2019-03-05 ENCOUNTER — Other Ambulatory Visit: Payer: Self-pay | Admitting: Family Medicine

## 2019-03-06 NOTE — Telephone Encounter (Signed)
Last filled in 2019 Last OV 11/04/2018  Ok to refill?

## 2019-03-15 DIAGNOSIS — R69 Illness, unspecified: Secondary | ICD-10-CM | POA: Diagnosis not present

## 2019-05-08 ENCOUNTER — Other Ambulatory Visit: Payer: Self-pay

## 2019-05-08 ENCOUNTER — Ambulatory Visit: Payer: Medicare HMO | Admitting: Dermatology

## 2019-05-08 ENCOUNTER — Encounter: Payer: Self-pay | Admitting: Dermatology

## 2019-05-08 DIAGNOSIS — D099 Carcinoma in situ, unspecified: Secondary | ICD-10-CM

## 2019-05-08 DIAGNOSIS — L570 Actinic keratosis: Secondary | ICD-10-CM | POA: Diagnosis not present

## 2019-05-08 DIAGNOSIS — L814 Other melanin hyperpigmentation: Secondary | ICD-10-CM | POA: Diagnosis not present

## 2019-05-08 DIAGNOSIS — C4491 Basal cell carcinoma of skin, unspecified: Secondary | ICD-10-CM

## 2019-05-08 DIAGNOSIS — D485 Neoplasm of uncertain behavior of skin: Secondary | ICD-10-CM

## 2019-05-08 DIAGNOSIS — D492 Neoplasm of unspecified behavior of bone, soft tissue, and skin: Secondary | ICD-10-CM

## 2019-05-08 DIAGNOSIS — C44219 Basal cell carcinoma of skin of left ear and external auricular canal: Secondary | ICD-10-CM | POA: Diagnosis not present

## 2019-05-08 DIAGNOSIS — D0421 Carcinoma in situ of skin of right ear and external auricular canal: Secondary | ICD-10-CM | POA: Diagnosis not present

## 2019-05-08 HISTORY — DX: Basal cell carcinoma of skin, unspecified: C44.91

## 2019-05-08 HISTORY — DX: Carcinoma in situ, unspecified: D09.9

## 2019-05-08 NOTE — Progress Notes (Signed)
   Follow-Up Visit   Subjective  Joe Murray is a 79 y.o. male who presents for the following: Skin Problem (rough spots on left neck right ear and chest no bleeding).  The following portions of the chart were reviewed this encounter and updated as appropriate:     Skin growths Location: ears, face. Duration: 6 months Quality: increased number Associated Signs/Symptoms: right ear sore Modifying Factors:  Severity:  Timing: Context:   Review of Systems: No other skin or systemic complaints.  Objective  Well appearing patient in no apparent distress; mood and affect are within normal limits.  A full examination was performed including scalp, head, eyes, ears, nose, lips, neck, chest, axillae, abdomen, back, buttocks, bilateral upper extremities, bilateral lower extremities, hands, feet, fingers, toes, fingernails, and toenails. All findings within normal limits unless otherwise noted below.  Objective  Right Ear: Superior 2.5cm upper ear junction, 7.5cm lower ear junction     Objective  Right Ear: Inferior 5.5cm upper ear junction, 3.0cm lower ear junction     Objective  Left angle of jaw: 4cm lower ear junc, 11cm corner mouth     Objective  Left Ear: 3.2cm upper junc, 7.5cm lower junc     Assessment & Plan  AK (actinic keratosis) (4) Mid Forehead (3); Right Malar Cheek  Destruction of lesion - Mid Forehead, Right Malar Cheek  Destruction method: cryotherapy   Outcome: patient tolerated procedure well with no complications    Neoplasm of skin (4) Right Ear  Skin / nail biopsy  Specimen 1 - Surgical pathology Differential Diagnosis: bcc x scc Check Margins: No  Right Ear  Skin / nail biopsy  Specimen 3 - Surgical pathology Differential Diagnosis: bcc x scc Check Margins: No  Left angle of jaw  Skin / nail biopsy Type of biopsy: tangential   Anesthesia: the lesion was anesthetized in a standard fashion   Anesthetic:  1% lidocaine w/  epinephrine 1-100,000 local infiltration Instrument used: flexible razor blade   Hemostasis achieved with: ferric subsulfate   Outcome: patient tolerated procedure well   Post-procedure details: sterile dressing applied and wound care instructions given   Dressing type: petrolatum   Additional details:  Patient identified lesion of concern.  Lesion identified by physician.  Specimen 4 - Surgical pathology Differential Diagnosis: bcc x scc Check Margins: No  Left Ear  Skin / nail biopsy Type of biopsy: tangential   Anesthesia: the lesion was anesthetized in a standard fashion   Anesthetic:  1% lidocaine w/ epinephrine 1-100,000 local infiltration Instrument used: flexible razor blade   Hemostasis achieved with: ferric subsulfate   Outcome: patient tolerated procedure well   Post-procedure details: sterile dressing applied and wound care instructions given   Dressing type: petrolatum   Additional details:  Patient identified lesion of concern.  Lesion identified by physician.  Specimen 5 - Surgical pathology Differential Diagnosis: bcc x scc Check Margins: No  Skin examination of sun exposed areas legs and waist up showed no atypical moles.  Thick crusts on the left ear, 2 on the right ear and one at the left angle of the jaw are possible carcinomas; each of these was sampled by shave biopsy.  Smaller crusts on the right cheek and 3 on the forehead were treated with liquid nitrogen freeze.  Initial follow-up by phone to discuss biopsies next week.

## 2019-05-08 NOTE — Patient Instructions (Signed)
Biopsy Wound Care Instructions  1. Leave the original bandage on for 24 hours if possible.  If the bandage becomes soaked or soiled before that time, it is OK to remove it and examine the wound.  A small amount of post-operative bleeding is normal.  If excessive bleeding occurs, remove the bandage, place gauze over the site and apply continuous pressure (no peeking) over the area for 30 minutes. If this does not work, please call our clinic as soon as possible or page your doctor if it is after hours.   2. Once a day, cleanse the wound with soap and water. It is fine to shower. If a thick crust develops you may use a Q-tip dipped into dilute hydrogen peroxide (mix 1:1 with water) to dissolve it.  Hydrogen peroxide can slow the healing process, so use it only as needed.    3. After washing, apply petroleum jelly (Vaseline) or an antibiotic ointment if your doctor prescribed one for you, followed by a bandage.    4. For best healing, the wound should be covered with a layer of ointment at all times. If you are not able to keep the area covered with a bandage to hold the ointment in place, this may mean re-applying the ointment several times a day.  Continue this wound care until the wound has healed and is no longer open.   Itching and mild discomfort is normal during the healing process. However, if you develop pain or severe itching, please call our office.   If you have any discomfort, you can take Tylenol (acetaminophen) or ibuprofen as directed on the bottle. (Please do not take these if you have an allergy to them or cannot take them for another reason).  Some redness, tenderness and white or yellow material in the wound is normal healing.  If the area becomes very sore and red, or develops a thick yellow-green material (pus), it may be infected; please notify us.    If you have stitches, return to clinic as directed to have the stitches removed. You will continue wound care for 2-3 days after  the stitches are removed.   Wound healing continues for up to one year following surgery. It is not unusual to experience pain in the scar from time to time during the interval.  If the pain becomes severe or the scar thickens, you should notify the office.    A slight amount of redness in a scar is expected for the first six months.  After six months, the redness will fade and the scar will soften and fade.  The color difference becomes less noticeable with time.  If there are any problems, return for a post-op surgery check at your earliest convenience.  To improve the appearance of the scar, you can use silicone scar gel, cream, or sheets (such as Mederma or Serica) every night for up to one year. These are available over the counter (without a prescription).  Please call our office for any questions or concerns.

## 2019-05-12 ENCOUNTER — Telehealth: Payer: Self-pay

## 2019-05-12 NOTE — Telephone Encounter (Signed)
Voicemail left for patient to give [place] a call back for his Pathology results.

## 2019-05-12 NOTE — Telephone Encounter (Signed)
-----   Message from Lavonna Monarch, MD sent at 05/11/2019  3:47 PM EDT ----- Schedule surgery with Dr. Darene Lamer

## 2019-05-15 ENCOUNTER — Telehealth: Payer: Self-pay | Admitting: *Deleted

## 2019-05-15 NOTE — Telephone Encounter (Signed)
Pathology report to patients wife. Patient has follow up 06/08/19 at 10 with Dr. Denna Haggard.

## 2019-05-19 ENCOUNTER — Telehealth: Payer: Self-pay | Admitting: Cardiology

## 2019-05-19 NOTE — Telephone Encounter (Signed)
Spoke with patient's wife per DPR. Educated her on location of [place] in the [facility]. Lab slips mailed to patient for Lipids and CMP. Spouse verbalized understanding.

## 2019-05-19 NOTE — Telephone Encounter (Signed)
   Pt' wife calling, she would like to know where [facility] they need to go for pt's blood work, she also wanted to know when they will receive the slip for it.  Please advise

## 2019-05-20 NOTE — Addendum Note (Signed)
Addended by: Lavonna Monarch on: 05/20/2019 04:30 PM   Modules accepted: Orders

## 2019-06-01 ENCOUNTER — Other Ambulatory Visit: Payer: Self-pay

## 2019-06-01 DIAGNOSIS — E785 Hyperlipidemia, unspecified: Secondary | ICD-10-CM | POA: Diagnosis not present

## 2019-06-01 DIAGNOSIS — I4589 Other specified conduction disorders: Secondary | ICD-10-CM

## 2019-06-01 DIAGNOSIS — I10 Essential (primary) hypertension: Secondary | ICD-10-CM | POA: Diagnosis not present

## 2019-06-01 DIAGNOSIS — I2510 Atherosclerotic heart disease of native coronary artery without angina pectoris: Secondary | ICD-10-CM | POA: Diagnosis not present

## 2019-06-01 DIAGNOSIS — M25561 Pain in right knee: Secondary | ICD-10-CM | POA: Diagnosis not present

## 2019-06-01 DIAGNOSIS — Z9861 Coronary angioplasty status: Secondary | ICD-10-CM | POA: Diagnosis not present

## 2019-06-01 LAB — COMPREHENSIVE METABOLIC PANEL
ALT: 14 IU/L (ref 0–44)
AST: 18 IU/L (ref 0–40)
Albumin/Globulin Ratio: 2 (ref 1.2–2.2)
Albumin: 4.4 g/dL (ref 3.7–4.7)
Alkaline Phosphatase: 55 IU/L (ref 39–117)
BUN/Creatinine Ratio: 18 (ref 10–24)
BUN: 22 mg/dL (ref 8–27)
Bilirubin Total: 0.3 mg/dL (ref 0.0–1.2)
CO2: 22 mmol/L (ref 20–29)
Calcium: 9.2 mg/dL (ref 8.6–10.2)
Chloride: 104 mmol/L (ref 96–106)
Creatinine, Ser: 1.23 mg/dL (ref 0.76–1.27)
GFR calc Af Amer: 64 mL/min/{1.73_m2} (ref >59)
GFR calc non Af Amer: 55 mL/min/{1.73_m2} — ABNORMAL LOW (ref >59)
Globulin, Total: 2.2 g/dL (ref 1.5–4.5)
Glucose: 134 mg/dL — ABNORMAL HIGH (ref 65–99)
Potassium: 4.5 mmol/L (ref 3.5–5.2)
Sodium: 142 mmol/L (ref 134–144)
Total Protein: 6.6 g/dL (ref 6.0–8.5)

## 2019-06-01 LAB — LIPID PANEL
Chol/HDL Ratio: 2.2 ratio (ref 0.0–5.0)
Cholesterol, Total: 129 mg/dL (ref 100–199)
HDL: 58 mg/dL (ref >39)
LDL Chol Calc (NIH): 54 mg/dL (ref 0–99)
Triglycerides: 92 mg/dL (ref 0–149)
VLDL Cholesterol Cal: 17 mg/dL (ref 5–40)

## 2019-06-08 ENCOUNTER — Encounter: Payer: Medicare HMO | Admitting: Dermatology

## 2019-06-08 DIAGNOSIS — M25561 Pain in right knee: Secondary | ICD-10-CM | POA: Diagnosis not present

## 2019-06-09 ENCOUNTER — Other Ambulatory Visit: Payer: Self-pay | Admitting: Cardiology

## 2019-06-15 ENCOUNTER — Ambulatory Visit: Payer: Medicare HMO | Admitting: Neurology

## 2019-06-16 DIAGNOSIS — M25561 Pain in right knee: Secondary | ICD-10-CM | POA: Diagnosis not present

## 2019-06-16 DIAGNOSIS — M79671 Pain in right foot: Secondary | ICD-10-CM | POA: Diagnosis not present

## 2019-06-19 ENCOUNTER — Encounter: Payer: Self-pay | Admitting: Neurology

## 2019-06-19 ENCOUNTER — Ambulatory Visit: Payer: Medicare HMO | Admitting: Neurology

## 2019-06-19 ENCOUNTER — Other Ambulatory Visit: Payer: Self-pay

## 2019-06-19 VITALS — BP 135/79 | HR 61 | Ht 70.0 in | Wt 197.0 lb

## 2019-06-19 DIAGNOSIS — G3184 Mild cognitive impairment, so stated: Secondary | ICD-10-CM

## 2019-06-19 DIAGNOSIS — G20 Parkinson's disease: Secondary | ICD-10-CM

## 2019-06-19 MED ORDER — CARBIDOPA-LEVODOPA 25-100 MG PO TABS: ORAL_TABLET | ORAL | 5 refills | Status: DC

## 2019-06-19 NOTE — Progress Notes (Signed)
NEUROLOGY FOLLOW UP OFFICE NOTE  Joe Murray [id number] Sep 11, 1940  HISTORY OF PRESENT ILLNESS: I had the pleasure of seeing Joe Murray in follow-up in the neurology clinic on 06/19/2019.  The patient was last seen 7 months ago for memory loss. He is again accompanied by his wife who helps supplement the history today.  Records and images were personally reviewed where available. MOCA blind (over the phone) done 10/2018 was 14/22. On his last visit, he was also noted to have more parkinsonian features with left hand resting tremor, reduced arm swing, more bradykinesia. Since his last visit, his wife has noticed he has problems sometimes recollecting what he is trying to tell her. She has noticed he has a lot of problems operating the remote control. 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 He manages his own medications and reports doing well, occasionally he may forget evening medication. His wife manages finances. He has not been driving much because he is not comfortable due to vision issues. More recently, he fell 2 weeks ago changing equipment on the tractor, and has had a hard time walking due to right leg pain. He has a hard time getting up from a chair, which his wife reports predates the fall. Pain is his main concern today. He reports his Orthopedic surgeon has done xrays of his leg with no acute changes seen, he complains today of right hip pain. He has tremors when he holds a glass with both hands, L>R. Speech today is hypophonic, he and his wife have not noticed any significant changes with speech. No swallowing difficulties. He has had double vision for a long time. He has not been sleeping well due to leg pain, taking hydrocodone and Tylenol. He denies any headaches, dizziness, neck/back pain, bowel/bladder dysfunction. No REM behavior disorder, hallucinations or paranoia.    History on Initial Assessment 04/06/2018: This is a 79 year old  right-handed man with a history of hypertension, hyperlipidemia, CAD, diabetes, presenting for evaluation of memory loss. He reports his memory is lousy. He started noticing memory changes around a year ago. His wife feels memory changes started a few years ago which she attributes to statin use, however in the past year, forgetfulness has worsened. He watches a lot of Westerns but would watch the same show 1-2 weeks later, his wife reminds him he has seen it already but he denies it. He forgets conversations. He does not remember people from the past, someone would pass away and he would not remember then. No significant difficulties following directions/instructions. His wife has not noticed significant memory decline in the past year. He drives minimally due to vision issues, and denies getting lost. His wife manages finances. He was previously on several medications that were stopped 2 months ago due to fatigue and memory concerns. He is only taking glipizide. He had muscle pains on statin in the past, his wife feels the Repatha helped improve his cholesterol levels but also caused leg pain. He reports right leg pain mostly after walking a few steps, improving when he rests. His wife has noticed slowed movements, he would be very slow when opening things. He has a little trouble getting out of a chair and feels his balance is off when he first stands, but feels fine once he starts walking. His wife  has noticed over the past year that gait has slowed down and his stride has been shorter. He speaks in  a low voice and does not move his mouth much when talking, his wife reports he has always been this way. No major personality changes, no paranoia or hallucinations. No family history of dementia, no history of significant head injuries. He drinks alcohol once a week.  He has had headaches over the past year localized over the back of his left ear. He describes a throbbing pain that only occurs in the evening  around 30-40 minutes after he sits in his recliner. It would start wearing off after a few hours and resolves by bedtime. He takes Tylenol every evening. No associated nausea/vomiting, photo/phonophobia. He has trouble seeing the words on TV and reports monocular diplopia in his right eye. He denies any dizziness, dysarthria/dysphagia, neck/back pain, focal numbness/tingling, bowel/bladder dysfunction, anosmia, no falls. Sleep is good, he has some daytime drowsiness and snores minimally. No REM behavior disorder noted by wife. He has had occasional left hand tremors the past year, he denies any difficulty writing with his right hand or using utensils. He is a retired Systems developer.   Diagnostic Data:  TSH normal, B12 low normal 312.  MRI brain without contrast done 03/2018 did not show any acute changes. There was mild to moderate diffuse atrophy and chronic microvascular disease.   PAST MEDICAL HISTORY: Past Medical History:  Diagnosis Date  . CAD S/P percutaneous coronary angioplasty 10/'03; 3/'09; 7/*09; 1/'17    a. Dr. Ellyn Hack; '03 - Cypher DES 3.0 mm 33 mm proximal-mid LAD (details D1 with ostial 60%); 3/'09 dRCA 2.75 mm x 13 mm Cypher DES (2.8 mm); 7/'09 pRCA 3.0 mm x 18 m Cypher DES (3.25 mm);; (2009) 2D Echo - EF 55%, (November 2014) nonischemic Myoview;; b. Promus DES to RCA and mid LAD 02/26/2015  . Chronotropic incompetence 12/2011   ETT 11/2017: Normal blood pressure response.  No EKG changes.  Exercise stopped due to fatigue and dyspnea.  Heart rate increased to low 90s from 50s.  Suggest chronotropic incompetence.  Severely impaired exercise capacity. -->  Results reviewed with electrophysiology: Still not indication for pacemaker.  . Diabetes mellitus type 2 in obese (HCC)    CAD  . Diverticulosis   . Dyslipidemia, goal LDL below 70    on [employer] study medicaion.  . ED (erectile dysfunction)   . GERD (gastroesophageal reflux disease)   . Hemorrhoids 07/2002   Internal and  External  . Insomnia   . Nodular basal cell carcinoma (BCC) 05/08/2019  . Non-STEMI (non-ST elevated myocardial infarction) ([facility]) 11/2001   a. Proximal LAD tandem lesions -- long DES stent covering both;; b. Jan 2017: mRCA PCI, [place] PCI  . Persistent sinus bradycardia   . Personal history of colonic adenomas 09/06/2012  . Psoriasis    Resting HR in 54s  . Spasm of esophagus   . Squamous cell carcinoma in situ (SCCIS) 05/08/2019   Right Ear Inferior  . SVT (supraventricular tachycardia) ([facility]) 2003  . Vertigo, benign positional     MEDICATIONS: Current Outpatient Medications on File Prior to Visit  Medication Sig Dispense Refill  . aspirin EC 81 MG tablet Take 81 mg by mouth daily.    . Cholecalciferol (VITAMIN D3) 5000 UNITS CAPS Take 5,000 Units by mouth daily. 30 capsule 0  . Cinnamon 500 MG capsule Take 2,000 mg by mouth daily.     . Evolocumab with Infusor ([place]) 420 MG/3.5ML SOCT Inject 420 mg into the skin every 30 (thirty) days. 3.5 mL 11  . fish oil-omega-3 fatty  acids 1000 MG capsule Take 2 g by mouth daily.      Marland Kitchen glipiZIDE (GLUCOTROL) 5 MG tablet TAKE 1 TABLET BY MOUTH TWICE DAILY BEFORE A MEAL 180 tablet 3  . HYDROcodone-acetaminophen (NORCO/VICODIN) 5-325 MG tablet hydrocodone 5 mg-acetaminophen 325 mg tablet  Take 1 tablet every 8 hours by oral route.    . Lancets (BD LANCET ULTRAFINE 30G) MISC USE ONE  TO CHECK GLUCOSE ONCE DAILY 100 each 1  . MAGNESIUM CHLORIDE PO Take 1 tablet by mouth daily.      . ONE TOUCH ULTRA TEST test strip USE ONE STRIP TO CHECK GLUCOSE ONCE DAILY 50 each 0  . potassium gluconate 595 MG TABS tablet Take 595 mg by mouth.    . nitroGLYCERIN (NITROSTAT) 0.4 MG SL tablet PLACE 1 TABLET UNDER THE TONGUE EVERY 5 MINUTES FOR 3 DOSES AS NEEDED FOR CHEST PAIN (Patient not taking: Reported on 06/19/2019) 25 tablet 6   No current facility-administered medications on file prior to visit.    ALLERGIES: Allergies  Allergen Reactions    . Beta Adrenergic Blockers Other (See Comments)    chronotropic incompetence  . Praluent [Alirocumab]     Diffuse Rash - improved when stopping & taking Benadryl  . Sulfonamide Derivatives     unknown  . Statins Other (See Comments)    REACTION: myalgias    FAMILY HISTORY: Family History  Problem Relation Age of Onset  . Diabetes Mother   . Hearing loss Father   . Dementia Father   . Heart attack Father 68       first MI prior to age 8  . Heart attack Brother     SOCIAL HISTORY: Social History   Socioeconomic History  . Marital status: Married    Spouse name: Not on file  . Number of children: 4  . Years of education: Not on file  . Highest education level: Not on file  Occupational History  . Occupation: retired  Tobacco Use  . Smoking status: Current Every Day Smoker    Types: Cigars    Start date: 07/06/1952  . Smokeless tobacco: Never Used  . Tobacco comment: 1-2 per day; he says "I really don't inhale"  Substance and Sexual Activity  . Alcohol use: Yes    Alcohol/week: 0.0 standard drinks    Comment: occ  . Drug use: No  . Sexual activity: Never  Other Topics Concern  . Not on file  Social History Narrative   He is a married, father of 67, grandfather 3.   He still smokes 2-3 cigars per day. States that he "does not really inhale ". He is not really all that it's including, stating that he wants to have his 1 remaining vice.   Exercises only on occasion.   He does various landscaping jobs including cutting wood, and clearing brush.      Pt is right handed   Lives in 2 story home with his wife   High school graduate   Retired Dealer   Social Determinants of Radio broadcast assistant Strain:   . Difficulty of Paying Living Expenses:   Food Insecurity:   . Worried About Charity fundraiser in the Last Year:   . Arboriculturist in the Last Year:   Transportation Needs:   . Film/video editor (Medical):   Marland Kitchen Lack of Transportation  (Non-Medical):   Physical Activity:   . Days of Exercise per Week:   . Minutes of  Exercise per Session:   Stress:   . Feeling of Stress :   Social Connections:   . Frequency of Communication with Friends and Family:   . Frequency of Social Gatherings with Friends and Family:   . Attends Religious Services:   . Active Member of Clubs or Organizations:   . Attends Archivist Meetings:   Marland Kitchen Marital Status:   Intimate Partner Violence:   . Fear of Current or Ex-Partner:   . Emotionally Abused:   Marland Kitchen Physically Abused:   . Sexually Abused:     REVIEW OF SYSTEMS: Constitutional: No fevers, chills, or sweats, no generalized fatigue, change in appetite Eyes: No visual changes, double vision, eye pain Ear, nose and throat: No hearing loss, ear pain, nasal congestion, sore throat Cardiovascular: No chest pain, palpitations Respiratory:  No shortness of breath at rest or with exertion, wheezes GastrointestinaI: No nausea, vomiting, diarrhea, abdominal pain, fecal incontinence Genitourinary:  No dysuria, urinary retention or frequency Musculoskeletal:  No neck pain, back pain, +right leg pain Integumentary: No rash, pruritus, skin lesions Neurological: as above Psychiatric: No depression, insomnia, anxiety Endocrine: No palpitations, fatigue, diaphoresis, mood swings, change in appetite, change in weight, increased thirst Hematologic/Lymphatic:  No anemia, purpura, petechiae. Allergic/Immunologic: no itchy/runny eyes, nasal congestion, recent allergic reactions, rashes  PHYSICAL EXAM: Vitals:   06/19/19 1418  BP: 135/79  Pulse: 61  SpO2: 97%   General: No acute distress, hypomimia Head:  Normocephalic/atraumatic Skin/Extremities: No rash, no edema Neurological Exam: alert and oriented to person, place, and time. No aphasia or dysarthria. Fund of knowledge is appropriate.  Recent and remote memory are impaired, 1/3 delayed recall. Attention and concentration are normal, 4/5  serial 7s, difficulty spelling WORLD backwards.  Able to name objects and repeat phrases. Cranial nerves: Pupils equal, round, reactive to light. Left ptosis. Extraocular movements: limited upgaze. Visual fields full. No facial asymmetry.  Motor: Bulk and tone normal, no cogwheeling noted today, muscle strength 5/5 throughout except for pain on right hip flexion. Deep tendon reflexes +2 throughout.  Finger to nose testing intact.  Gait small shuffling steps with walker. No tremor today. He has bradykinesia with reduced finger taps   IMPRESSION: This is a 79 yo RH man with a history of hypertension, hyperlipidemia, diabetes, CAD, with mild cognitive impairment and parkinsonism. MRI brain unremarkable with mild to moderate chronic microvascular disease. No tremor noted today, gait changes are more notable, although he is also in pain on right leg due to recent fall. He is more bradykinetic. We discussed a trial of Sinemet, side effects discussed. Start Sinemet 25/100mg  1/2 tab TID with meals for 1 week, then increase to 1 tab TID. His wife also reports more cognitive changes, we will plan to start Memantine on next visit (would avoid Donepezil due to bradycardia). MRI brain unremarkable, mild to moderate chronic microvascular disease. Proceed with PT for right leg pain and balance therapy as previously discussed. They will discuss right hip pain with Ortho. He will follow-up in 6 months and knows to call for any changes  Thank you for allowing me to participate in his care.  Please do not hesitate to call for any questions or concerns.   Ellouise Newer, M.D.   CC: Dr. Sarajane Jews

## 2019-06-19 NOTE — Patient Instructions (Addendum)
1. Start Sinemet 25/100mg : take 1/2 tablet three times a day with meals for 1 week, then increase to 1 tablet three times a day  2. Refer to PT/Balance therapy for right leg pain, parkinson's disease  3. Follow-up in 6 months, call for any changes  FALL PRECAUTIONS: Be cautious when walking. Scan the area for obstacles that may increase the risk of trips and falls. When getting up in the mornings, sit up at the edge of the bed for a few minutes before getting out of bed. Consider elevating the bed at the head end to avoid drop of blood pressure when getting up. Walk always in a well-lit room (use night lights in the walls). Avoid area rugs or power cords from appliances in the middle of the walkways. Use a walker or a cane if necessary and consider physical therapy for balance exercise. Get your eyesight checked regularly.  FINANCIAL OVERSIGHT: Supervision, especially oversight when making financial decisions or transactions is also recommended.  HOME SAFETY: Consider the safety of the kitchen when operating appliances like stoves, microwave oven, and blender. Consider having supervision and share cooking responsibilities until no longer able to participate in those. Accidents with firearms and other hazards in the house should be identified and addressed as well.  DRIVING: Regarding driving, in patients with progressive memory problems, driving will be impaired. We advise to have someone else do the driving if trouble finding directions or if minor accidents are reported. Independent driving assessment is available to determine safety of driving.  ABILITY TO BE LEFT ALONE: If patient is unable to contact 911 operator, consider using LifeLine, or when the need is there, arrange for someone to stay with patients. Smoking is a fire hazard, consider supervision or cessation. Risk of wandering should be assessed by caregiver and if detected at any point, supervision and safe proof recommendations should be  instituted.  MEDICATION SUPERVISION: Inability to self-administer medication needs to be constantly addressed. Implement a mechanism to ensure safe administration of the medications.  RECOMMENDATIONS FOR ALL PATIENTS WITH MEMORY PROBLEMS: 1. Continue to exercise (Recommend 30 minutes of walking everyday, or 3 hours every week) 2. Increase social interactions - continue going to [place] and enjoy social gatherings with friends and family 3. Eat healthy, avoid fried foods and eat more fruits and vegetables 4. Maintain adequate blood pressure, blood sugar, and blood cholesterol level. Reducing the risk of stroke and cardiovascular disease also helps promoting better memory. 5. Avoid stressful situations. Live a simple life and avoid aggravations. Organize your time and prepare for the next day in anticipation. 6. Sleep well, avoid any interruptions of sleep and avoid any distractions in the bedroom that may interfere with adequate sleep quality 7. Avoid sugar, avoid sweets as there is a strong link between excessive sugar intake, diabetes, and cognitive impairment The Mediterranean diethas been shown to help patients reduce the risk of progressive memory disorders and reduces cardiovascular risk. This includes eating fish, eat fruits and green leafy vegetables, nuts like almonds and hazelnuts, walnuts, and also use olive oil. Avoid fast foods and fried foods as much as possible. Avoid sweets and sugar as sugar use has been linked to worsening of memory function.  There is always a concern of gradual progression of memory problems. If this is the case, then we may need to adjust level of care according to patient needs. Support, both to the patient and caregiver, should then be put into place.

## 2019-06-27 DIAGNOSIS — G20 Parkinson's disease: Secondary | ICD-10-CM | POA: Diagnosis not present

## 2019-06-27 DIAGNOSIS — E119 Type 2 diabetes mellitus without complications: Secondary | ICD-10-CM | POA: Diagnosis not present

## 2019-06-27 DIAGNOSIS — I2510 Atherosclerotic heart disease of native coronary artery without angina pectoris: Secondary | ICD-10-CM | POA: Diagnosis not present

## 2019-06-27 DIAGNOSIS — E559 Vitamin D deficiency, unspecified: Secondary | ICD-10-CM | POA: Diagnosis not present

## 2019-06-27 DIAGNOSIS — I10 Essential (primary) hypertension: Secondary | ICD-10-CM | POA: Diagnosis not present

## 2019-06-27 DIAGNOSIS — I70219 Atherosclerosis of native arteries of extremities with intermittent claudication, unspecified extremity: Secondary | ICD-10-CM | POA: Diagnosis not present

## 2019-06-27 DIAGNOSIS — M7061 Trochanteric bursitis, right hip: Secondary | ICD-10-CM | POA: Diagnosis not present

## 2019-06-27 DIAGNOSIS — I493 Ventricular premature depolarization: Secondary | ICD-10-CM | POA: Diagnosis not present

## 2019-06-27 DIAGNOSIS — C44221 Squamous cell carcinoma of skin of unspecified ear and external auricular canal: Secondary | ICD-10-CM | POA: Diagnosis not present

## 2019-06-27 DIAGNOSIS — E669 Obesity, unspecified: Secondary | ICD-10-CM | POA: Diagnosis not present

## 2019-06-29 DIAGNOSIS — I493 Ventricular premature depolarization: Secondary | ICD-10-CM | POA: Diagnosis not present

## 2019-06-29 DIAGNOSIS — I10 Essential (primary) hypertension: Secondary | ICD-10-CM | POA: Diagnosis not present

## 2019-06-29 DIAGNOSIS — E119 Type 2 diabetes mellitus without complications: Secondary | ICD-10-CM | POA: Diagnosis not present

## 2019-06-29 DIAGNOSIS — E559 Vitamin D deficiency, unspecified: Secondary | ICD-10-CM | POA: Diagnosis not present

## 2019-06-29 DIAGNOSIS — C44221 Squamous cell carcinoma of skin of unspecified ear and external auricular canal: Secondary | ICD-10-CM | POA: Diagnosis not present

## 2019-06-29 DIAGNOSIS — G20 Parkinson's disease: Secondary | ICD-10-CM | POA: Diagnosis not present

## 2019-06-29 DIAGNOSIS — E669 Obesity, unspecified: Secondary | ICD-10-CM | POA: Diagnosis not present

## 2019-06-29 DIAGNOSIS — M7061 Trochanteric bursitis, right hip: Secondary | ICD-10-CM | POA: Diagnosis not present

## 2019-06-29 DIAGNOSIS — I2510 Atherosclerotic heart disease of native coronary artery without angina pectoris: Secondary | ICD-10-CM | POA: Diagnosis not present

## 2019-06-29 DIAGNOSIS — I70219 Atherosclerosis of native arteries of extremities with intermittent claudication, unspecified extremity: Secondary | ICD-10-CM | POA: Diagnosis not present

## 2019-07-05 DIAGNOSIS — E559 Vitamin D deficiency, unspecified: Secondary | ICD-10-CM | POA: Diagnosis not present

## 2019-07-05 DIAGNOSIS — I10 Essential (primary) hypertension: Secondary | ICD-10-CM | POA: Diagnosis not present

## 2019-07-05 DIAGNOSIS — M7061 Trochanteric bursitis, right hip: Secondary | ICD-10-CM | POA: Diagnosis not present

## 2019-07-05 DIAGNOSIS — I2510 Atherosclerotic heart disease of native coronary artery without angina pectoris: Secondary | ICD-10-CM | POA: Diagnosis not present

## 2019-07-05 DIAGNOSIS — I493 Ventricular premature depolarization: Secondary | ICD-10-CM | POA: Diagnosis not present

## 2019-07-05 DIAGNOSIS — E669 Obesity, unspecified: Secondary | ICD-10-CM | POA: Diagnosis not present

## 2019-07-05 DIAGNOSIS — E119 Type 2 diabetes mellitus without complications: Secondary | ICD-10-CM | POA: Diagnosis not present

## 2019-07-05 DIAGNOSIS — C44221 Squamous cell carcinoma of skin of unspecified ear and external auricular canal: Secondary | ICD-10-CM | POA: Diagnosis not present

## 2019-07-05 DIAGNOSIS — I70219 Atherosclerosis of native arteries of extremities with intermittent claudication, unspecified extremity: Secondary | ICD-10-CM | POA: Diagnosis not present

## 2019-07-05 DIAGNOSIS — G20 Parkinson's disease: Secondary | ICD-10-CM | POA: Diagnosis not present

## 2019-07-07 DIAGNOSIS — E559 Vitamin D deficiency, unspecified: Secondary | ICD-10-CM | POA: Diagnosis not present

## 2019-07-07 DIAGNOSIS — I493 Ventricular premature depolarization: Secondary | ICD-10-CM | POA: Diagnosis not present

## 2019-07-07 DIAGNOSIS — I10 Essential (primary) hypertension: Secondary | ICD-10-CM | POA: Diagnosis not present

## 2019-07-07 DIAGNOSIS — M7061 Trochanteric bursitis, right hip: Secondary | ICD-10-CM | POA: Diagnosis not present

## 2019-07-07 DIAGNOSIS — I70219 Atherosclerosis of native arteries of extremities with intermittent claudication, unspecified extremity: Secondary | ICD-10-CM | POA: Diagnosis not present

## 2019-07-07 DIAGNOSIS — G20 Parkinson's disease: Secondary | ICD-10-CM | POA: Diagnosis not present

## 2019-07-07 DIAGNOSIS — C44221 Squamous cell carcinoma of skin of unspecified ear and external auricular canal: Secondary | ICD-10-CM | POA: Diagnosis not present

## 2019-07-07 DIAGNOSIS — E669 Obesity, unspecified: Secondary | ICD-10-CM | POA: Diagnosis not present

## 2019-07-07 DIAGNOSIS — E119 Type 2 diabetes mellitus without complications: Secondary | ICD-10-CM | POA: Diagnosis not present

## 2019-07-07 DIAGNOSIS — I2510 Atherosclerotic heart disease of native coronary artery without angina pectoris: Secondary | ICD-10-CM | POA: Diagnosis not present

## 2019-07-08 DIAGNOSIS — Z20822 Contact with and (suspected) exposure to covid-19: Secondary | ICD-10-CM | POA: Diagnosis not present

## 2019-07-11 DIAGNOSIS — E119 Type 2 diabetes mellitus without complications: Secondary | ICD-10-CM | POA: Diagnosis not present

## 2019-07-11 DIAGNOSIS — E559 Vitamin D deficiency, unspecified: Secondary | ICD-10-CM | POA: Diagnosis not present

## 2019-07-11 DIAGNOSIS — G20 Parkinson's disease: Secondary | ICD-10-CM | POA: Diagnosis not present

## 2019-07-11 DIAGNOSIS — I493 Ventricular premature depolarization: Secondary | ICD-10-CM | POA: Diagnosis not present

## 2019-07-11 DIAGNOSIS — E669 Obesity, unspecified: Secondary | ICD-10-CM | POA: Diagnosis not present

## 2019-07-11 DIAGNOSIS — C44221 Squamous cell carcinoma of skin of unspecified ear and external auricular canal: Secondary | ICD-10-CM | POA: Diagnosis not present

## 2019-07-11 DIAGNOSIS — I70219 Atherosclerosis of native arteries of extremities with intermittent claudication, unspecified extremity: Secondary | ICD-10-CM | POA: Diagnosis not present

## 2019-07-11 DIAGNOSIS — I10 Essential (primary) hypertension: Secondary | ICD-10-CM | POA: Diagnosis not present

## 2019-07-11 DIAGNOSIS — I2510 Atherosclerotic heart disease of native coronary artery without angina pectoris: Secondary | ICD-10-CM | POA: Diagnosis not present

## 2019-07-11 DIAGNOSIS — M7061 Trochanteric bursitis, right hip: Secondary | ICD-10-CM | POA: Diagnosis not present

## 2019-07-14 DIAGNOSIS — I70219 Atherosclerosis of native arteries of extremities with intermittent claudication, unspecified extremity: Secondary | ICD-10-CM | POA: Diagnosis not present

## 2019-07-14 DIAGNOSIS — M7061 Trochanteric bursitis, right hip: Secondary | ICD-10-CM | POA: Diagnosis not present

## 2019-07-14 DIAGNOSIS — G20 Parkinson's disease: Secondary | ICD-10-CM | POA: Diagnosis not present

## 2019-07-14 DIAGNOSIS — E669 Obesity, unspecified: Secondary | ICD-10-CM | POA: Diagnosis not present

## 2019-07-14 DIAGNOSIS — I10 Essential (primary) hypertension: Secondary | ICD-10-CM | POA: Diagnosis not present

## 2019-07-14 DIAGNOSIS — E559 Vitamin D deficiency, unspecified: Secondary | ICD-10-CM | POA: Diagnosis not present

## 2019-07-14 DIAGNOSIS — I2510 Atherosclerotic heart disease of native coronary artery without angina pectoris: Secondary | ICD-10-CM | POA: Diagnosis not present

## 2019-07-14 DIAGNOSIS — I493 Ventricular premature depolarization: Secondary | ICD-10-CM | POA: Diagnosis not present

## 2019-07-14 DIAGNOSIS — C44221 Squamous cell carcinoma of skin of unspecified ear and external auricular canal: Secondary | ICD-10-CM | POA: Diagnosis not present

## 2019-07-14 DIAGNOSIS — E119 Type 2 diabetes mellitus without complications: Secondary | ICD-10-CM | POA: Diagnosis not present

## 2019-07-17 ENCOUNTER — Encounter: Payer: Self-pay | Admitting: *Deleted

## 2019-07-18 DIAGNOSIS — I2510 Atherosclerotic heart disease of native coronary artery without angina pectoris: Secondary | ICD-10-CM | POA: Diagnosis not present

## 2019-07-18 DIAGNOSIS — I70219 Atherosclerosis of native arteries of extremities with intermittent claudication, unspecified extremity: Secondary | ICD-10-CM | POA: Diagnosis not present

## 2019-07-18 DIAGNOSIS — E119 Type 2 diabetes mellitus without complications: Secondary | ICD-10-CM | POA: Diagnosis not present

## 2019-07-18 DIAGNOSIS — E669 Obesity, unspecified: Secondary | ICD-10-CM | POA: Diagnosis not present

## 2019-07-18 DIAGNOSIS — E559 Vitamin D deficiency, unspecified: Secondary | ICD-10-CM | POA: Diagnosis not present

## 2019-07-18 DIAGNOSIS — I493 Ventricular premature depolarization: Secondary | ICD-10-CM | POA: Diagnosis not present

## 2019-07-18 DIAGNOSIS — C44221 Squamous cell carcinoma of skin of unspecified ear and external auricular canal: Secondary | ICD-10-CM | POA: Diagnosis not present

## 2019-07-18 DIAGNOSIS — M7061 Trochanteric bursitis, right hip: Secondary | ICD-10-CM | POA: Diagnosis not present

## 2019-07-18 DIAGNOSIS — I10 Essential (primary) hypertension: Secondary | ICD-10-CM | POA: Diagnosis not present

## 2019-07-18 DIAGNOSIS — G20 Parkinson's disease: Secondary | ICD-10-CM | POA: Diagnosis not present

## 2019-07-20 ENCOUNTER — Ambulatory Visit (INDEPENDENT_AMBULATORY_CARE_PROVIDER_SITE_OTHER): Payer: Medicare HMO | Admitting: Dermatology

## 2019-07-20 ENCOUNTER — Other Ambulatory Visit: Payer: Self-pay

## 2019-07-20 ENCOUNTER — Encounter: Payer: Self-pay | Admitting: Dermatology

## 2019-07-20 DIAGNOSIS — D0421 Carcinoma in situ of skin of right ear and external auricular canal: Secondary | ICD-10-CM

## 2019-07-20 DIAGNOSIS — C44219 Basal cell carcinoma of skin of left ear and external auricular canal: Secondary | ICD-10-CM

## 2019-07-20 DIAGNOSIS — L570 Actinic keratosis: Secondary | ICD-10-CM

## 2019-07-20 DIAGNOSIS — D485 Neoplasm of uncertain behavior of skin: Secondary | ICD-10-CM

## 2019-07-20 DIAGNOSIS — C44729 Squamous cell carcinoma of skin of left lower limb, including hip: Secondary | ICD-10-CM | POA: Diagnosis not present

## 2019-07-20 DIAGNOSIS — D099 Carcinoma in situ, unspecified: Secondary | ICD-10-CM

## 2019-07-20 MED ORDER — MUPIROCIN 2 % EX OINT: 1.0000 "application " | TOPICAL_OINTMENT | Freq: Two times a day (BID) | CUTANEOUS | 1 refills | Status: DC

## 2019-07-20 NOTE — Patient Instructions (Addendum)
Biopsy, Surgery (Curettage) & Surgery (Excision) Aftercare Instructions  1. Okay to remove bandage in 24 hours  2. Wash area with soap and water  3. Apply Vaseline to area twice daily until healed (Not Neosporin)  4. Okay to cover with a Band-Aid to decrease the chance of infection or prevent irritation from clothing; also it's okay to uncover lesion at home.  5. Suture instructions: return to our office in 7-10 or 10-14 days for a nurse visit for suture removal. Variable healing with sutures, if pain or itching occurs call our office. It's okay to shower or bathe 24 hours after sutures are given.  6. The following risks may occur after a biopsy, curettage or excision: bleeding, scarring, discoloration, recurrence, infection (redness, yellow drainage, pain or swelling).  7. For questions, concerns and results call our office at [place] before 4pm & Friday before 3pm. Biopsy results will be available in 1 week.  Joe Murray returns today with 2 biopsy-proven nonmelanoma skin cancers on his ears.  The carcinoma in situ on the right lower ear showed minimal root with curettage and should heal quite quickly. the basal cell skin cancer on the left upper ear had a small root but did not require cutting and stitching so the family can expect an abrasion there for a couple of weeks.  Below the skin cancer on the left ear was a 1 cm pink crust which fits a precancer; this was treated with liquid nitrogen freeze and may swell and peel over the next 2 weeks.  Also spots in front of each sideburn were treated; the thicker 1 in the right sideburn may not respond to freezing but this can be biopsied in the future if it persists.  Of more concern was a nonhealing 1 cm crust on the left lower shin.  Joe Murray has many dilated veins in the area as well as diabetes so this is an area with very poor healing.  Simple shave biopsy was obtained and I requested that the family apply prescription mupirocin  ointment to the leg daily after washing (they may also use this on the spots on the ears); and elevate the leg when Joe Murray is not up and moving, he is encouraged to keep the left leg elevated instead of in a down position.  The family can call the office on Tuesday to check on the results of the biopsy on the left leg; that will determine his next follow-up

## 2019-07-24 ENCOUNTER — Encounter: Payer: Self-pay | Admitting: Dermatology

## 2019-07-24 NOTE — Progress Notes (Signed)
Follow-Up Visit   Subjective  SHELTON KRELL is a 79 y.o. male who presents for the following: Procedure (here for treatment right ear inferior- CIS & Left ear- BCC nod) and Skin Problem (Check spot on left lower leg x months. Not healing.).  Skin cancer Location: Left ear Duration:  Quality:  Associated Signs/Symptoms: Modifying Factors:  Severity:  Timing: Context: Several other spots of concern  The following portions of the chart were reviewed this encounter and updated as appropriate:     Objective  Well appearing patient in no apparent distress; mood and affect are within normal limits.  A focused examination was performed including Head and neck and legs examined. Relevant physical exam findings are noted in the Assessment and Plan.   Assessment & Plan  AK (actinic keratosis) (3) Left Inferior Helix; Left Zygomatic Area; Right Zygomatic Area  Destruction of lesion - Left Inferior Helix, Left Zygomatic Area, Right Zygomatic Area Complexity: simple   Destruction method: cryotherapy   Informed consent: discussed and consent obtained   Timeout:  patient name, date of birth, surgical site, and procedure verified Lesion destroyed using liquid nitrogen: Yes   Region frozen until ice ball extended beyond lesion: Yes   Outcome: patient tolerated procedure well with no complications    Basal cell carcinoma (BCC) of helix of left ear Left ear  Destruction of lesion Complexity: simple   Destruction method: electrodesiccation and curettage   Informed consent: discussed and consent obtained   Timeout:  patient name, date of birth, surgical site, and procedure verified Anesthesia: the lesion was anesthetized in a standard fashion   Anesthetic:  1% lidocaine w/ epinephrine 1-100,000 local infiltration Curettage performed in three different directions: Yes   Curettage cycles:  3 Lesion length (cm):  1 Margin per side (cm):  0 Hemostasis achieved with:  ferric  subsulfate Outcome: patient tolerated procedure well with no complications   Post-procedure details: wound care instructions given   Additional details:  Inoculated with parenteral 5% fluorouracil  Neoplasm of uncertain behavior of skin Left Lower Leg - Anterior  Skin / nail biopsy Type of biopsy: tangential   Informed consent: discussed and consent obtained   Timeout: patient name, date of birth, surgical site, and procedure verified   Procedure prep:  Patient was prepped and draped in usual sterile fashion Prep type:  Chlorhexidine Anesthesia: the lesion was anesthetized in a standard fashion   Anesthetic:  1% lidocaine w/ epinephrine 1-100,000 local infiltration Instrument used: flexible razor blade   Hemostasis achieved with: ferric subsulfate   Outcome: patient tolerated procedure well   Post-procedure details: wound care instructions given    Specimen 1 - Surgical pathology Differential Diagnosis: scc vs bcc Check Margins: No  Ordered Medications: mupirocin ointment (BACTROBAN) 2 %  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  Destruction of lesion Complexity: simple   Destruction method: electrodesiccation and curettage   Informed consent: discussed and consent obtained   Timeout:  patient name, date of birth, surgical site, and procedure verified Anesthesia: the lesion was anesthetized in a standard fashion   Anesthetic:  1% lidocaine w/ epinephrine 1-100,000 local infiltration Curettage performed in three different directions: Yes   Curettage cycles:  3 Lesion length (cm):  1 Lesion width (cm):  1 Margin per side (cm):  0 Final wound size (cm):  1 Hemostasis achieved with:  ferric subsulfate Outcome: patient tolerated procedure well with no complications   Post-procedure details: wound care instructions given   Additional details:  Inoculated with parenteral 5% fluorouracil Marx Gershon returns today with 2 biopsy-proven nonmelanoma skin cancers on  his ears.  The carcinoma in situ on the right lower ear showed minimal root with curettage and should heal quite quickly. the basal cell skin cancer on the left upper ear had a small root but did not require cutting and stitching so the family can expect an abrasion there for a couple of weeks.  Below the skin cancer on the left ear was a 1 cm pink crust which fits a precancer; this was treated with liquid nitrogen freeze and may swell and peel over the next 2 weeks.  Also spots in front of each sideburn were treated; the thicker 1 in the right sideburn may not respond to freezing but this can be biopsied in the future if it persists.  Of more concern was a nonhealing 1 cm crust on the left lower shin.  Mr. Cuautle has many dilated veins in the area as well as diabetes so this is an area with very poor healing.  Simple shave biopsy was obtained and I requested that the family apply prescription mupirocin ointment to the leg daily after washing (they may also use this on the spots on the ears); and elevate the leg when Mr. Harbeson is not up and moving, he is encouraged to keep the left leg elevated instead of in a down position.  The family can call the office on Tuesday to check on the results of the biopsy on the left leg; that will determine his next follow-up

## 2019-07-25 ENCOUNTER — Telehealth: Payer: Self-pay

## 2019-07-25 NOTE — Telephone Encounter (Signed)
Phone call to patient with his pathology results. Voicemail left for patient.

## 2019-07-25 NOTE — Telephone Encounter (Signed)
-----   Message from Stuart Tafeen, MD sent at 07/22/2019  8:15 AM EDT ----- °Schedule surgery with Dr. T °

## 2019-07-26 DIAGNOSIS — E559 Vitamin D deficiency, unspecified: Secondary | ICD-10-CM | POA: Diagnosis not present

## 2019-07-26 DIAGNOSIS — G20 Parkinson's disease: Secondary | ICD-10-CM | POA: Diagnosis not present

## 2019-07-26 DIAGNOSIS — E669 Obesity, unspecified: Secondary | ICD-10-CM | POA: Diagnosis not present

## 2019-07-26 DIAGNOSIS — I10 Essential (primary) hypertension: Secondary | ICD-10-CM | POA: Diagnosis not present

## 2019-07-26 DIAGNOSIS — I493 Ventricular premature depolarization: Secondary | ICD-10-CM | POA: Diagnosis not present

## 2019-07-26 DIAGNOSIS — I70219 Atherosclerosis of native arteries of extremities with intermittent claudication, unspecified extremity: Secondary | ICD-10-CM | POA: Diagnosis not present

## 2019-07-26 DIAGNOSIS — I2510 Atherosclerotic heart disease of native coronary artery without angina pectoris: Secondary | ICD-10-CM | POA: Diagnosis not present

## 2019-07-26 DIAGNOSIS — M7061 Trochanteric bursitis, right hip: Secondary | ICD-10-CM | POA: Diagnosis not present

## 2019-07-26 DIAGNOSIS — E119 Type 2 diabetes mellitus without complications: Secondary | ICD-10-CM | POA: Diagnosis not present

## 2019-07-26 DIAGNOSIS — C44221 Squamous cell carcinoma of skin of unspecified ear and external auricular canal: Secondary | ICD-10-CM | POA: Diagnosis not present

## 2019-08-01 NOTE — Telephone Encounter (Signed)
Phone call to patient with his pathology results. Patient aware of results.  

## 2019-08-07 DIAGNOSIS — I70219 Atherosclerosis of native arteries of extremities with intermittent claudication, unspecified extremity: Secondary | ICD-10-CM | POA: Diagnosis not present

## 2019-08-07 DIAGNOSIS — E559 Vitamin D deficiency, unspecified: Secondary | ICD-10-CM | POA: Diagnosis not present

## 2019-08-07 DIAGNOSIS — M7061 Trochanteric bursitis, right hip: Secondary | ICD-10-CM | POA: Diagnosis not present

## 2019-08-07 DIAGNOSIS — C44221 Squamous cell carcinoma of skin of unspecified ear and external auricular canal: Secondary | ICD-10-CM | POA: Diagnosis not present

## 2019-08-07 DIAGNOSIS — I10 Essential (primary) hypertension: Secondary | ICD-10-CM | POA: Diagnosis not present

## 2019-08-07 DIAGNOSIS — G20 Parkinson's disease: Secondary | ICD-10-CM | POA: Diagnosis not present

## 2019-08-07 DIAGNOSIS — E669 Obesity, unspecified: Secondary | ICD-10-CM | POA: Diagnosis not present

## 2019-08-07 DIAGNOSIS — I2510 Atherosclerotic heart disease of native coronary artery without angina pectoris: Secondary | ICD-10-CM | POA: Diagnosis not present

## 2019-08-07 DIAGNOSIS — I493 Ventricular premature depolarization: Secondary | ICD-10-CM | POA: Diagnosis not present

## 2019-08-07 DIAGNOSIS — E119 Type 2 diabetes mellitus without complications: Secondary | ICD-10-CM | POA: Diagnosis not present

## 2019-08-07 NOTE — Telephone Encounter (Signed)
-----   Message from Lavonna Monarch, MD sent at 07/22/2019  8:15 AM EDT ----- Schedule surgery with Dr. Darene Lamer

## 2019-08-07 NOTE — Telephone Encounter (Signed)
This encounter was created in error - please disregard.

## 2019-08-22 DIAGNOSIS — I2510 Atherosclerotic heart disease of native coronary artery without angina pectoris: Secondary | ICD-10-CM | POA: Diagnosis not present

## 2019-08-22 DIAGNOSIS — E559 Vitamin D deficiency, unspecified: Secondary | ICD-10-CM | POA: Diagnosis not present

## 2019-08-22 DIAGNOSIS — C44221 Squamous cell carcinoma of skin of unspecified ear and external auricular canal: Secondary | ICD-10-CM | POA: Diagnosis not present

## 2019-08-22 DIAGNOSIS — E669 Obesity, unspecified: Secondary | ICD-10-CM | POA: Diagnosis not present

## 2019-08-22 DIAGNOSIS — I10 Essential (primary) hypertension: Secondary | ICD-10-CM | POA: Diagnosis not present

## 2019-08-22 DIAGNOSIS — E119 Type 2 diabetes mellitus without complications: Secondary | ICD-10-CM | POA: Diagnosis not present

## 2019-08-22 DIAGNOSIS — G20 Parkinson's disease: Secondary | ICD-10-CM | POA: Diagnosis not present

## 2019-08-22 DIAGNOSIS — I70219 Atherosclerosis of native arteries of extremities with intermittent claudication, unspecified extremity: Secondary | ICD-10-CM | POA: Diagnosis not present

## 2019-08-22 DIAGNOSIS — I493 Ventricular premature depolarization: Secondary | ICD-10-CM | POA: Diagnosis not present

## 2019-08-22 DIAGNOSIS — M7061 Trochanteric bursitis, right hip: Secondary | ICD-10-CM | POA: Diagnosis not present

## 2019-09-07 ENCOUNTER — Encounter: Payer: Medicare HMO | Admitting: Dermatology

## 2019-09-22 ENCOUNTER — Telehealth: Payer: Self-pay | Admitting: Cardiology

## 2019-09-22 DIAGNOSIS — I10 Essential (primary) hypertension: Secondary | ICD-10-CM

## 2019-09-22 DIAGNOSIS — E785 Hyperlipidemia, unspecified: Secondary | ICD-10-CM

## 2019-09-22 NOTE — Telephone Encounter (Signed)
The patient has an appointment in October with Dr. Ellyn Hack. He had a CMET and Lipid panel done in April. He would like to know if anything labs needed to be completed prior to the appointment.

## 2019-09-22 NOTE — Telephone Encounter (Signed)
New Message  Pts wife is calling and is wondering if patient will need labs before appt   Please advise

## 2019-09-24 NOTE — Telephone Encounter (Signed)
His labs at that time look great.  I think he needs started on PCSK9 inhibitor.  It would be nice to have a relook lipid panel and chemistry panel (FLP & CMP) prior to her October visit.  If they look as good as it is in April, but only check annually.  Glenetta Hew, MD

## 2019-09-25 NOTE — Telephone Encounter (Signed)
The patient has been made aware. Labs ordered. He may get them done at his PCP appointment and then have them faxed. His wife will keep the office posted.

## 2019-09-28 ENCOUNTER — Encounter: Payer: Medicare HMO | Admitting: Dermatology

## 2019-11-02 ENCOUNTER — Ambulatory Visit (INDEPENDENT_AMBULATORY_CARE_PROVIDER_SITE_OTHER): Payer: Medicare HMO | Admitting: Dermatology

## 2019-11-02 ENCOUNTER — Encounter: Payer: Self-pay | Admitting: Dermatology

## 2019-11-02 ENCOUNTER — Other Ambulatory Visit: Payer: Self-pay

## 2019-11-02 DIAGNOSIS — C4492 Squamous cell carcinoma of skin, unspecified: Secondary | ICD-10-CM

## 2019-11-02 DIAGNOSIS — C44729 Squamous cell carcinoma of skin of left lower limb, including hip: Secondary | ICD-10-CM | POA: Diagnosis not present

## 2019-11-02 NOTE — Progress Notes (Signed)
Curet and cautery 40fu treatment size 1.7 cm

## 2019-11-02 NOTE — Patient Instructions (Addendum)
Biopsy, Surgery (Curettage) & Surgery (Excision) Aftercare Instructions  1. Okay to remove bandage in 24 hours  2. Wash area with soap and water  3. Apply Vaseline to area twice daily until healed (Not Neosporin)  4. Okay to cover with a Band-Aid to decrease the chance of infection or prevent irritation from clothing; also it's okay to uncover lesion at home.  5. Suture instructions: return to our office in 7-10 or 10-14 days for a nurse visit for suture removal. Variable healing with sutures, if pain or itching occurs call our office. It's okay to shower or bathe 24 hours after sutures are given.  6. The following risks may occur after a biopsy, curettage or excision: bleeding, scarring, discoloration, recurrence, infection (redness, yellow drainage, pain or swelling).  7. For questions, concerns and results call our office at [place] before 4pm & Friday before 3pm. Biopsy results will be available in 1 week.  Surgical follow-up for Joe Murray date of birth 07-29-1940.  Biopsy on his left lower shin showed a well differentiated squamous cell carcinoma.  Curettage disclosed this to be rather superficial so it was curetted and treated with 5-FU inoculation rather than cut and stitch.  You can expect an abrasion on this area for 3 to 4weeks and the first week it will have a little pink halo and be a little sore.  Soap and water is fine; after bathing please apply a little plain Vaseline or if you are not allergic, Neosporin-triple antibiotic ointment.  If you have any problem with the spot, my cell phone number is [phone].  If you call please leave a message and I will call you right back.  Even if is doing perfectly well, please call the office number in 3 to 4 weeks and leave me a message that you doing okay if all does well then I just want to do a general check on you 1 time in the winter.

## 2019-11-06 ENCOUNTER — Other Ambulatory Visit: Payer: Self-pay

## 2019-11-07 ENCOUNTER — Encounter: Payer: Self-pay | Admitting: Family Medicine

## 2019-11-07 ENCOUNTER — Ambulatory Visit (INDEPENDENT_AMBULATORY_CARE_PROVIDER_SITE_OTHER): Payer: Medicare HMO | Admitting: Family Medicine

## 2019-11-07 VITALS — BP 148/80 | HR 62 | Temp 97.8°F | Ht 68.0 in | Wt 198.4 lb

## 2019-11-07 DIAGNOSIS — Z125 Encounter for screening for malignant neoplasm of prostate: Secondary | ICD-10-CM | POA: Diagnosis not present

## 2019-11-07 DIAGNOSIS — Z0000 Encounter for general adult medical examination without abnormal findings: Secondary | ICD-10-CM | POA: Diagnosis not present

## 2019-11-07 DIAGNOSIS — E559 Vitamin D deficiency, unspecified: Secondary | ICD-10-CM | POA: Diagnosis not present

## 2019-11-07 DIAGNOSIS — E118 Type 2 diabetes mellitus with unspecified complications: Secondary | ICD-10-CM | POA: Diagnosis not present

## 2019-11-07 DIAGNOSIS — Z23 Encounter for immunization: Secondary | ICD-10-CM

## 2019-11-07 NOTE — Addendum Note (Signed)
Addended by: Matilde Sprang on: 11/07/2019 05:45 PM   Modules accepted: Orders

## 2019-11-07 NOTE — Progress Notes (Signed)
Subjective:    Patient ID: Joe Murray, male    DOB: [birth year], 79 y.o.   MRN: [id number]  HPI Here for a well exam. He is doing well. His Parkinsons disease has slowed down quite a bit. He tries to stay active. He still mows his own grass. He had a few skin cancers removed by Dr. Denna Haggard this summer.    Review of Systems  Constitutional: Negative.   HENT: Negative.   Eyes: Negative.   Respiratory: Negative.   Cardiovascular: Negative.   Gastrointestinal: Negative.   Genitourinary: Negative.   Musculoskeletal: Negative.   Skin: Negative.   Neurological: Negative.   Psychiatric/Behavioral: Negative.        Objective:   Physical Exam Constitutional:      General: He is not in acute distress.    Appearance: He is well-developed. He is not diaphoretic.  HENT:     Head: Normocephalic and atraumatic.     Right Ear: External ear normal.     Left Ear: External ear normal.     Nose: Nose normal.     Mouth/Throat:     Pharynx: No oropharyngeal exudate.  Eyes:     General: No scleral icterus.       Right eye: No discharge.        Left eye: No discharge.     Conjunctiva/sclera: Conjunctivae normal.     Pupils: Pupils are equal, round, and reactive to light.  Neck:     Thyroid: No thyromegaly.     Vascular: No JVD.     Trachea: No tracheal deviation.  Cardiovascular:     Rate and Rhythm: Normal rate and regular rhythm.     Heart sounds: Normal heart sounds. No murmur heard.  No friction rub. No gallop.   Pulmonary:     Effort: Pulmonary effort is normal. No respiratory distress.     Breath sounds: Normal breath sounds. No wheezing or rales.  Chest:     Chest wall: No tenderness.  Abdominal:     General: Bowel sounds are normal. There is no distension.     Palpations: Abdomen is soft. There is no mass.     Tenderness: There is no abdominal tenderness. There is no guarding or rebound.  Genitourinary:    Penis: Normal. No tenderness.      Testes: Normal.      Prostate: Normal.     Rectum: Normal. Guaiac result negative.  Musculoskeletal:        General: No tenderness. Normal range of motion.     Cervical back: Neck supple.  Lymphadenopathy:     Cervical: No cervical adenopathy.  Skin:    General: Skin is warm and dry.     Coloration: Skin is not pale.     Findings: No erythema or rash.  Neurological:     Mental Status: He is alert and oriented to person, place, and time.     Cranial Nerves: No cranial nerve deficit.     Motor: No abnormal muscle tone.     Coordination: Coordination normal.     Deep Tendon Reflexes: Reflexes are normal and symmetric. Reflexes normal.  Psychiatric:        Behavior: Behavior normal.        Thought Content: Thought content normal.        Judgment: Judgment normal.           Assessment & Plan:  Well exam. We discussed diet and exercise.  Get fasting labs.  Annie Main  Sarajane Jews, MD

## 2019-11-08 LAB — CBC WITH DIFFERENTIAL/PLATELET
Absolute Monocytes: 619 cells/uL (ref 200–950)
Basophils Absolute: 50 cells/uL (ref 0–200)
Basophils Relative: 0.7 %
Eosinophils Absolute: 187 cells/uL (ref 15–500)
Eosinophils Relative: 2.6 %
HCT: 44.4 % (ref 38.5–50.0)
Hemoglobin: 14.8 g/dL (ref 13.2–17.1)
Lymphs Abs: 1843 cells/uL (ref 850–3900)
MCH: 31.6 pg (ref 27.0–33.0)
MCHC: 33.3 g/dL (ref 32.0–36.0)
MCV: 94.7 fL (ref 80.0–100.0)
MPV: 10.3 fL (ref 7.5–12.5)
Monocytes Relative: 8.6 %
Neutro Abs: 4500 cells/uL (ref 1500–7800)
Neutrophils Relative %: 62.5 %
Platelets: 204 10*3/uL (ref 140–400)
RBC: 4.69 10*6/uL (ref 4.20–5.80)
RDW: 12.3 % (ref 11.0–15.0)
Total Lymphocyte: 25.6 %
WBC: 7.2 10*3/uL (ref 3.8–10.8)

## 2019-11-08 LAB — HEPATIC FUNCTION PANEL
AG Ratio: 1.9 (calc) (ref 1.0–2.5)
ALT: 13 U/L (ref 9–46)
AST: 19 U/L (ref 10–35)
Albumin: 4.3 g/dL (ref 3.6–5.1)
Alkaline phosphatase (APISO): 46 U/L (ref 35–144)
Bilirubin, Direct: 0.1 mg/dL (ref 0.0–0.2)
Globulin: 2.3 g/dL (calc) (ref 1.9–3.7)
Indirect Bilirubin: 0.4 mg/dL (calc) (ref 0.2–1.2)
Total Bilirubin: 0.5 mg/dL (ref 0.2–1.2)
Total Protein: 6.6 g/dL (ref 6.1–8.1)

## 2019-11-08 LAB — LIPID PANEL
Cholesterol: 136 mg/dL (ref <200)
HDL: 62 mg/dL (ref >=40)
LDL Cholesterol (Calc): 58 mg/dL (calc)
Non-HDL Cholesterol (Calc): 74 mg/dL (calc) (ref <130)
Total CHOL/HDL Ratio: 2.2 (calc) (ref <5.0)
Triglycerides: 76 mg/dL (ref <150)

## 2019-11-08 LAB — BASIC METABOLIC PANEL
BUN/Creatinine Ratio: 19 (calc) (ref 6–22)
BUN: 24 mg/dL (ref 7–25)
CO2: 24 mmol/L (ref 20–32)
Calcium: 9.3 mg/dL (ref 8.6–10.3)
Chloride: 107 mmol/L (ref 98–110)
Creat: 1.26 mg/dL — ABNORMAL HIGH (ref 0.70–1.18)
Glucose, Bld: 129 mg/dL — ABNORMAL HIGH (ref 65–99)
Potassium: 4.5 mmol/L (ref 3.5–5.3)
Sodium: 138 mmol/L (ref 135–146)

## 2019-11-08 LAB — HEMOGLOBIN A1C
Hgb A1c MFr Bld: 6.5 % of total Hgb — ABNORMAL HIGH (ref <5.7)
Mean Plasma Glucose: 140 (calc)
eAG (mmol/L): 7.7 (calc)

## 2019-11-08 LAB — VITAMIN D 25 HYDROXY (VIT D DEFICIENCY, FRACTURES): Vit D, 25-Hydroxy: 107 ng/mL — ABNORMAL HIGH (ref 30–100)

## 2019-11-08 LAB — PSA: PSA: 1.71 ng/mL (ref <=4.0)

## 2019-11-08 LAB — TSH: TSH: 1.57 mIU/L (ref 0.40–4.50)

## 2019-11-09 ENCOUNTER — Encounter: Payer: Self-pay | Admitting: *Deleted

## 2019-11-13 ENCOUNTER — Telehealth: Payer: Self-pay | Admitting: Family Medicine

## 2019-11-13 NOTE — Telephone Encounter (Signed)
Spoke with patient's wife she stated he has Parkinson's Disease and would not be able to due the questions

## 2019-11-18 ENCOUNTER — Emergency Department (HOSPITAL_COMMUNITY)
Admission: EM | Admit: 2019-11-18 | Discharge: 2019-11-19 | Discharge status: Against Medical Advice | Payer: Medicare HMO | Attending: Emergency Medicine | Admitting: Emergency Medicine

## 2019-11-18 ENCOUNTER — Other Ambulatory Visit: Payer: Self-pay

## 2019-11-18 ENCOUNTER — Emergency Department (HOSPITAL_COMMUNITY): Payer: Medicare HMO

## 2019-11-18 DIAGNOSIS — W228XXA Striking against or struck by other objects, initial encounter: Secondary | ICD-10-CM | POA: Insufficient documentation

## 2019-11-18 DIAGNOSIS — I10 Essential (primary) hypertension: Secondary | ICD-10-CM | POA: Insufficient documentation

## 2019-11-18 DIAGNOSIS — Z7982 Long term (current) use of aspirin: Secondary | ICD-10-CM | POA: Diagnosis not present

## 2019-11-18 DIAGNOSIS — Z7984 Long term (current) use of oral hypoglycemic drugs: Secondary | ICD-10-CM | POA: Insufficient documentation

## 2019-11-18 DIAGNOSIS — J3489 Other specified disorders of nose and nasal sinuses: Secondary | ICD-10-CM | POA: Diagnosis not present

## 2019-11-18 DIAGNOSIS — M47812 Spondylosis without myelopathy or radiculopathy, cervical region: Secondary | ICD-10-CM | POA: Diagnosis not present

## 2019-11-18 DIAGNOSIS — F17290 Nicotine dependence, other tobacco product, uncomplicated: Secondary | ICD-10-CM | POA: Insufficient documentation

## 2019-11-18 DIAGNOSIS — K5790 Diverticulosis of intestine, part unspecified, without perforation or abscess without bleeding: Secondary | ICD-10-CM | POA: Insufficient documentation

## 2019-11-18 DIAGNOSIS — J439 Emphysema, unspecified: Secondary | ICD-10-CM | POA: Diagnosis not present

## 2019-11-18 DIAGNOSIS — E1159 Type 2 diabetes mellitus with other circulatory complications: Secondary | ICD-10-CM | POA: Insufficient documentation

## 2019-11-18 DIAGNOSIS — S0990XA Unspecified injury of head, initial encounter: Secondary | ICD-10-CM | POA: Insufficient documentation

## 2019-11-18 DIAGNOSIS — W19XXXA Unspecified fall, initial encounter: Secondary | ICD-10-CM | POA: Diagnosis not present

## 2019-11-18 DIAGNOSIS — S199XXA Unspecified injury of neck, initial encounter: Secondary | ICD-10-CM | POA: Diagnosis not present

## 2019-11-18 DIAGNOSIS — R4020 Unspecified coma: Secondary | ICD-10-CM | POA: Diagnosis not present

## 2019-11-18 DIAGNOSIS — Z85828 Personal history of other malignant neoplasm of skin: Secondary | ICD-10-CM | POA: Diagnosis not present

## 2019-11-18 DIAGNOSIS — I2510 Atherosclerotic heart disease of native coronary artery without angina pectoris: Secondary | ICD-10-CM | POA: Diagnosis not present

## 2019-11-18 DIAGNOSIS — S069X9A Unspecified intracranial injury with loss of consciousness of unspecified duration, initial encounter: Secondary | ICD-10-CM | POA: Diagnosis not present

## 2019-11-18 DIAGNOSIS — Y9389 Activity, other specified: Secondary | ICD-10-CM | POA: Diagnosis not present

## 2019-11-18 DIAGNOSIS — Y9229 Other specified public building as the place of occurrence of the external cause: Secondary | ICD-10-CM | POA: Insufficient documentation

## 2019-11-18 DIAGNOSIS — G319 Degenerative disease of nervous system, unspecified: Secondary | ICD-10-CM | POA: Diagnosis not present

## 2019-11-18 DIAGNOSIS — R001 Bradycardia, unspecified: Secondary | ICD-10-CM | POA: Diagnosis not present

## 2019-11-18 DIAGNOSIS — R69 Illness, unspecified: Secondary | ICD-10-CM | POA: Diagnosis not present

## 2019-11-18 DIAGNOSIS — I672 Cerebral atherosclerosis: Secondary | ICD-10-CM | POA: Diagnosis not present

## 2019-11-18 MED ORDER — TETANUS-DIPHTH-ACELL PERTUSSIS 5-2.5-18.5 LF-MCG/0.5 IM SUSP: 0.5000 mL | Freq: Once | INTRAMUSCULAR | Status: DC

## 2019-11-18 NOTE — Discharge Instructions (Addendum)
Please follow up with your primary care provider within 5-7 days for re-evaluation of your symptoms. If you do not have a primary care provider, information for a healthcare clinic has been provided for you to make arrangements for follow up care. Please return to the emergency department for any new or worsening symptoms. ° °

## 2019-11-18 NOTE — ED Notes (Signed)
Patient transported to CT 

## 2019-11-18 NOTE — ED Triage Notes (Signed)
Pt BIB [facility] after falling off a stool while at a wedding.  Pt hit head on concrete patio, then had a witnessed LOC for about 30 seconds.  Pt states he had a few beers.  A/O x4 upon arrival.  Small abrasion on head posteriorly.    EMS vitals:  BP 180/100 HR 60 irregular

## 2019-11-18 NOTE — ED Notes (Signed)
Wife standing at door, when asked if she can be helped she stated "I'm just waiting for someone to make a decision on my husband so I can take him home".

## 2019-11-18 NOTE — ED Provider Notes (Signed)
[facility] EMERGENCY DEPARTMENT Provider Note   CSN: [id number] Arrival date & time: 11/18/19  2131     History No chief complaint on file.   Joe Murray is a 79 y.o. male.  HPI    79 year old male with a history of CAD s/p prior coronary angioplasty, diabetes, chronic tropic incompetence, dyslipidemia, diverticulosis, GERD, nodular basal cell carcinoma, and STEMI, SVT, who presents to the emergency department today for evaluation after a fall.  Patient does not remember the fall therefore additional history obtained from EMS.  EMS states that patient was sitting on his front porch alert and fell backwards in his chair hitting his head on the ground.  Following this he had a loss of consciousness.  They state that he has been drinking some alcohol tonight.  Patient denies any complaints during my evaluation including no neck or back pain.  Denies any chest pain, shortness of breath, abdominal pain, recent nausea vomiting diarrhea or fevers.  Spoke with the patient's wife at bedside.  She states that he was sitting on a bench at a wedding and had been drinking alcohol when he fell backwards hitting his head on another chair and then on the ground.  She is not sure if he had a syncopal episode or not.  Past Medical History:  Diagnosis Date   CAD S/P percutaneous coronary angioplasty 10/'03; 3/'09; 7/*09; 1/'17    a. Dr. Ellyn Hack; '03 - Cypher DES 3.0 mm 33 mm proximal-mid LAD (details D1 with ostial 60%); 3/'09 dRCA 2.75 mm x 13 mm Cypher DES (2.8 mm); 7/'09 pRCA 3.0 mm x 18 m Cypher DES (3.25 mm);; (2009) 2D Echo - EF 55%, (November 2014) nonischemic Myoview;; b. Promus DES to RCA and mid LAD 02/26/2015   Chronotropic incompetence 12/2011   ETT 11/2017: Normal blood pressure response.  No EKG changes.  Exercise stopped due to fatigue and dyspnea.  Heart rate increased to low 90s from 50s.  Suggest chronotropic incompetence.  Severely impaired exercise capacity.  -->  Results reviewed with electrophysiology: Still not indication for pacemaker.   Diabetes mellitus type 2 in obese [facility])    CAD   Diverticulosis    Dyslipidemia, goal LDL below 70    on [employer] study medicaion.   ED (erectile dysfunction)    GERD (gastroesophageal reflux disease)    Hemorrhoids 07/2002   Internal and External   Insomnia    Nodular basal cell carcinoma (BCC) 05/08/2019   left ear (cx9fu)   Non-STEMI (non-ST elevated myocardial infarction) ([place]) 11/2001   a. Proximal LAD tandem lesions -- long DES stent covering both;; b. Jan 2017: mRCA PCI, mLAD PCI   Persistent sinus bradycardia    Personal history of colonic adenomas 09/06/2012   Psoriasis    Resting HR in 54s   SCC (squamous cell carcinoma) 11/13/2001   right upper outer arm (txpbx)   SCC (squamous cell carcinoma) 11/13/2009   right upper ear rim (cx3 26fu)   SCC (squamous cell carcinoma) 01/20/2010   right nose (cx29fu)   SCC (squamous cell carcinoma) 03/28/2014   right side of nose ( cx53fu)   SCC (squamous cell carcinoma) 06/03/2016   right side of nose   SCC (squamous cell carcinoma) 05/08/2019   right ear inferior (cx61fu) cis   Spasm of esophagus    Squamous cell carcinoma in situ (SCCIS) 05/08/2019   Right Ear Inferior   Squamous cell carcinoma of skin 11/08/2001   top right rim ear  SVT (supraventricular tachycardia) ([place]) 2003   Vertigo, benign positional     Patient Active Problem List   Diagnosis Date Noted   Intermittent claudication ([place]) 12/01/2017   Trochanteric bursitis of right hip 09/04/2017   Fuchs' corneal dystrophy 08/14/2016   Essential hypertension 03/05/2015   NSTEMI (non-ST elevated myocardial infarction) [facility])    Coronary artery disease involving native coronary artery of native heart without angina pectoris    Vitamin D deficiency 07/04/2014   Fatigue 01/08/2014   Obesity (BMI 30-39.9) 01/12/2013   DM (diabetes mellitus), type 2  with complications - CAD    Dyslipidemia, goal LDL below 70    Exertional shortness of breath 01/12/2012   Chronotropic incompetence 12/25/2011   Vertigo with mild postural lightheadedness. 08/13/2009   ERECTILE DYSFUNCTION, ORGANIC 05/01/2009   GERD 04/18/2008   BPH with urinary obstruction 07/27/2007   CAD S/P percutaneous coronary angioplasty - LADx2, & RCA x 3 07/12/2001    Class: History of    Past Surgical History:  Procedure Laterality Date   CARDIAC CATHETERIZATION N/A 02/26/2015   Procedure: Left Heart Cath and Coronary Angiography;  Surgeon: Leonie Man, MD: mRCA 99% --> PCI. mLAD 75%-> FFR Guided PCI, Mod ISR in pLAD & pRCA   CARDIAC CATHETERIZATION N/A 02/26/2015   Procedure: Coronary Stent Intervention;  Surgeon: Leonie Man, MD;  Location: [facility] INVASIVE CV LAB: mRCA Promus Premier DES 3.0 x 12 (3.5), mLAD Promus Premier DES 2.75 x 20 (3.0)   CARDIAC CATHETERIZATION N/A 02/26/2015   Procedure: Intravascular Pressure Wire/FFR Study;  Surgeon: Leonie Man, MD;  Location: [place] CV LAB;  Service: Cardiovascular: LAD 75% - FFR + -> PCI   CATARACT EXTRACTION  [id number]   COLONOSCOPY  12/16/2017   per Dr. Carlean Purl, adenomatous polyps, no repeats due to age    [place]  LAD - 2003, RCA 3 & 7/ '09   Cypher 3.0 x 32 mid LAD; Cyper 2.75 x 13 - distal RCA, 3.0 x 18 Prox RCA   ESOPHAGOGASTRODUODENOSCOPY  08-11-02   esophageal dilation per Dr. Carlean Purl   HERNIA REPAIR     right inguinal    NM MYOVIEW LTD  Nov 2014   ~8 METS; EF 60%, no ischemia or infarction   PERCUTANEOUS CORONARY STENT INTERVENTION (PCI-S)  11/23/2001   NSTEMI: Prox-Mid LAD tandem ~80% lesions on either side of D1 (with 80% lesion) -- Cypher DES 3.0 mm x 33 m (covering both lesions)    PERCUTANEOUS CORONARY STENT INTERVENTION (PCI-S)  04/27/2007   Unstable Angina: Distal RCA 95%: Cypher 2.75x13  (2.8 mm); residual focal ~60%ISR in LAD stent, 60% D1 ostial (no  PTCA on LAD due to no ischemia on ST)   PERCUTANEOUS CORONARY STENT INTERVENTION (PCI-S)  09/21/2007   Bradycardia & Unstable Angina: Prox RCA 70% - PCI Cypher DES 3.0 mm x 18 mm  (3.25 mm); ostial 60-70% jailed D1. LAD & distal RCA stents patent   SHOULDER SURGERY     left rotator cuff, Dr. Gladstone Lighter   TRANSTHORACIC ECHOCARDIOGRAM  March 2009   Normal LV size and function, EF 55%. Mild MR; mild RV dilation.   TRANSTHORACIC ECHOCARDIOGRAM  02/2015   EF 60-65%. Moderate concentric LVH. Normal function with normal regional wall motion. GR 1 DD   WRIST FRACTURE SURGERY     right       Family History  Problem Relation Age of Onset   Diabetes Mother    Hearing loss Father  Dementia Father    Heart attack Father 48       first MI prior to age 30   Heart attack Brother     Social History   Tobacco Use   Smoking status: Current Every Day Smoker    Types: Cigars    Start date: 07/06/1952   Smokeless tobacco: Never Used   Tobacco comment: 1-2 per day; he says "I really don't inhale"  Vaping Use   Vaping Use: Never used  Substance Use Topics   Alcohol use: Yes    Alcohol/week: 0.0 standard drinks    Comment: occ   Drug use: No    Home Medications Prior to Admission medications   Medication Sig Start Date End Date Taking? Authorizing Provider  aspirin EC 81 MG tablet Take 81 mg by mouth daily.   Yes [provider]  carbidopa-levodopa (SINEMET IR) 25-100 MG tablet Take 1/2 tablet three times a day with meals for a week, then increase to 1 tablet three times a day with meals Patient taking differently: Take 1 tablet by mouth 3 (three) times daily.  06/19/19  Yes Cameron Sprang, MD  Cholecalciferol (VITAMIN D3) 5000 UNITS CAPS Take 5,000 Units by mouth daily. 06/25/11  Yes Laurey Morale, MD  Cinnamon 500 MG capsule Take 2,000 mg by mouth daily.    Yes [provider]  Evolocumab with Infusor ([place]) 420 MG/3.5ML SOCT Inject 420 mg  into the skin every 30 (thirty) days. 12/01/18  Yes Leonie Man, MD  fish oil-omega-3 fatty acids 1000 MG capsule Take 2 g by mouth daily.     Yes [provider]  glipiZIDE (GLUCOTROL) 5 MG tablet TAKE 1 TABLET BY MOUTH TWICE DAILY BEFORE A MEAL Patient taking differently: Take 5 mg by mouth 2 (two) times daily before a meal.  03/07/19  Yes Laurey Morale, MD  MAGNESIUM CHLORIDE PO Take 1 tablet by mouth daily.     Yes [provider]  nitroGLYCERIN (NITROSTAT) 0.4 MG SL tablet PLACE 1 TABLET UNDER THE TONGUE EVERY 5 MINUTES FOR 3 DOSES AS NEEDED FOR CHEST PAIN Patient taking differently: Place 0.4 mg under the tongue every 5 (five) minutes as needed for chest pain.  06/09/19  Yes Leonie Man, MD  potassium gluconate 595 MG TABS tablet Take 595 mg by mouth.   Yes [provider]  HYDROcodone-acetaminophen (NORCO/VICODIN) 5-325 MG tablet hydrocodone 5 mg-acetaminophen 325 mg tablet  Take 1 tablet every 8 hours by oral route. Patient not taking: Reported on 11/18/2019    [provider]  Lancets (BD LANCET ULTRAFINE 30G) MISC USE ONE  TO CHECK GLUCOSE ONCE DAILY 03/03/16   Laurey Morale, MD  mupirocin ointment (BACTROBAN) 2 % Apply 1 application topically 2 (two) times daily. 07/20/19   Lavonna Monarch, MD  ONE TOUCH ULTRA TEST test strip USE ONE STRIP TO CHECK GLUCOSE ONCE DAILY 04/18/18   Laurey Morale, MD    Allergies    Beta adrenergic blockers, Praluent [alirocumab], Sulfonamide derivatives, and Statins  Review of Systems   Review of Systems  Constitutional: Negative for fever.  HENT: Negative for ear pain and sore throat.   Eyes: Negative for visual disturbance.  Respiratory: Negative for cough and shortness of breath.   Cardiovascular: Negative for chest pain.  Gastrointestinal: Negative for abdominal pain, constipation, diarrhea, nausea and vomiting.  Genitourinary: Negative for dysuria and hematuria.  Musculoskeletal: Negative for back pain.    Skin: Negative for  color change and rash.  Neurological:       Head injury, +LOC  All other systems reviewed and are negative.  Physical Exam Updated Vital Signs BP (!) 165/59 (BP Location: Right Arm)    Pulse (!) 55    Temp 97.8 F (36.6 C) (Oral)    Resp 20    SpO2 98%   Physical Exam Vitals and nursing note reviewed.  Constitutional:      Appearance: He is well-developed.  HENT:     Head: Normocephalic.     Comments: Hematoma to left posterior scalp with superficial abrasion Eyes:     Conjunctiva/sclera: Conjunctivae normal.  Neck:     Comments: ccollar in place Cardiovascular:     Rate and Rhythm: Normal rate and regular rhythm.     Heart sounds: Normal heart sounds. No murmur heard.   Pulmonary:     Effort: Pulmonary effort is normal. No respiratory distress.     Breath sounds: Normal breath sounds. No wheezing, rhonchi or rales.  Abdominal:     General: Bowel sounds are normal.     Palpations: Abdomen is soft.     Tenderness: There is no abdominal tenderness. There is no guarding or rebound.  Musculoskeletal:     Cervical back: Neck supple.     Comments: No TTP to the CTL spine  Skin:    General: Skin is warm and dry.  Neurological:     Mental Status: He is alert.     Comments: Mental Status:  Alert, thought content appropriate, able to give a coherent history. Speech fluent without evidence of aphasia. Able to follow 2 step commands without difficulty.  Cranial Nerves:  II:   pupils equal, round, reactive to light III,IV, VI: ptosis not present, extra-ocular motions intact bilaterally  V,VII: smile symmetric, facial light touch sensation equal VIII: hearing grossly normal to voice  X: uvula elevates symmetrically  XI: bilateral shoulder shrug symmetric and strong XII: midline tongue extension without fassiculations Motor:  Normal tone. 5/5 strength of BUE and BLE major muscle groups including strong and equal grip strength and dorsiflexion/plantar  flexion Sensory: light touch normal in all extremities.      ED Results / Procedures / Treatments   Labs (all labs ordered are listed, but only abnormal results are displayed) Labs Reviewed  CBC WITH DIFFERENTIAL/PLATELET  COMPREHENSIVE METABOLIC PANEL  ETHANOL  TROPONIN I (HIGH SENSITIVITY)    EKG EKG Interpretation  Date/Time:  Saturday November 18 2019 21:44:58 EDT Ventricular Rate:  58 PR Interval:    QRS Duration: 125 QT Interval:  416 QTC Calculation: 409 R Axis:   -60 Text Interpretation: Sinus rhythm Nonspecific IVCD with LAD No significant change since last tracing Confirmed by Calvert Cantor [phone]) on 11/18/2019 10:47:04 PM   Radiology CT Head Wo Contrast  Result Date: 11/18/2019 CLINICAL DATA:  Patient fell, striking head. Loss of consciousness for 30 seconds. EXAM: CT HEAD WITHOUT CONTRAST CT CERVICAL SPINE WITHOUT CONTRAST TECHNIQUE: Multidetector CT imaging of the head and cervical spine was performed following the standard protocol without intravenous contrast. Multiplanar CT image reconstructions of the cervical spine were also generated. COMPARISON:  Cervical spine radiographs 01/10/2018. MRI brain 04/16/2018 FINDINGS: CT HEAD FINDINGS Brain: No evidence of acute infarction, hemorrhage, hydrocephalus, extra-axial collection or mass lesion/mass effect. Diffuse cerebral atrophy. Patchy low-attenuation changes in the deep white matter consistent with small vessel ischemia. Vascular: Intracranial arterial vascular calcifications are present. Skull: Calvarium appears intact. Sinuses/Orbits: Mild mucosal thickening in the paranasal sinuses.  No acute air-fluid levels. Mastoid air cells are clear. Other: None. CT CERVICAL SPINE FINDINGS Alignment: Straightening of the usual cervical lordosis without anterior subluxation. This is likely positional but muscle spasm could also have this appearance. C1-2 articulation appears intact. Skull base and vertebrae: Skull base appears  intact. No vertebral compression deformities. No focal bone lesion or bone destruction. Bone cortex appears intact. Soft tissues and spinal canal: No prevertebral soft tissue swelling. No abnormal paraspinal soft tissue mass or infiltration. Disc levels: Degenerative changes with disc space narrowing and endplate hypertrophic change most prominent at C5-6, C6-7, and C7-T1 levels. Upper chest: Emphysematous changes in the lung apices. Other: None. IMPRESSION: 1. No acute intracranial abnormalities. Chronic atrophy and small vessel ischemic changes. 2. Nonspecific straightening of usual cervical lordosis. Degenerative changes in the cervical spine. No acute displaced fractures identified. Electronically Signed   By: Lucienne Capers M.D.   On: 11/18/2019 22:49   CT Cervical Spine Wo Contrast  Result Date: 11/18/2019 CLINICAL DATA:  Patient fell, striking head. Loss of consciousness for 30 seconds. EXAM: CT HEAD WITHOUT CONTRAST CT CERVICAL SPINE WITHOUT CONTRAST TECHNIQUE: Multidetector CT imaging of the head and cervical spine was performed following the standard protocol without intravenous contrast. Multiplanar CT image reconstructions of the cervical spine were also generated. COMPARISON:  Cervical spine radiographs 01/10/2018. MRI brain 04/16/2018 FINDINGS: CT HEAD FINDINGS Brain: No evidence of acute infarction, hemorrhage, hydrocephalus, extra-axial collection or mass lesion/mass effect. Diffuse cerebral atrophy. Patchy low-attenuation changes in the deep white matter consistent with small vessel ischemia. Vascular: Intracranial arterial vascular calcifications are present. Skull: Calvarium appears intact. Sinuses/Orbits: Mild mucosal thickening in the paranasal sinuses. No acute air-fluid levels. Mastoid air cells are clear. Other: None. CT CERVICAL SPINE FINDINGS Alignment: Straightening of the usual cervical lordosis without anterior subluxation. This is likely positional but muscle spasm could also have  this appearance. C1-2 articulation appears intact. Skull base and vertebrae: Skull base appears intact. No vertebral compression deformities. No focal bone lesion or bone destruction. Bone cortex appears intact. Soft tissues and spinal canal: No prevertebral soft tissue swelling. No abnormal paraspinal soft tissue mass or infiltration. Disc levels: Degenerative changes with disc space narrowing and endplate hypertrophic change most prominent at C5-6, C6-7, and C7-T1 levels. Upper chest: Emphysematous changes in the lung apices. Other: None. IMPRESSION: 1. No acute intracranial abnormalities. Chronic atrophy and small vessel ischemic changes. 2. Nonspecific straightening of usual cervical lordosis. Degenerative changes in the cervical spine. No acute displaced fractures identified. Electronically Signed   By: Lucienne Capers M.D.   On: 11/18/2019 22:49    Procedures Procedures (including critical care time)  Medications Ordered in ED Medications  Tdap (BOOSTRIX) injection 0.5 mL (has no administration in time range)    ED Course  I have reviewed the triage vital signs and the nursing notes.  Pertinent labs & imaging results that were available during my care of the patient were reviewed by me and considered in my medical decision making (see chart for details).    MDM Rules/Calculators/A&P                          79 year old male here for evaluation after a fall with head trauma.  It is unclear whether or not he had a syncopal episode or not but per EMS he lost consciousness after falling and hitting his head.  He has had no other associated symptoms and does not have any other complaints other than  head pain.  On telemetry monitor his heart rates noted to be in the 40s which per his wife is baseline for him.  His EKG shows normal sinus rhythm with nonspecific IVCD with LAD.  CT head/cervical spine did not show any evidence of acute traumatic injury.  I reassessed the patient and discussed  results of imaging studies.  I also recommended laboratory work and recommended that the patient be admission due to concern for possible syncope especially in setting of low heart rate.  Wife at bedside is adamant that she does not want patient to be admitted to the hospital.  They understand the risk of permanent disability and death that could occur if the patient is discharged without proper medical work-up.  She and the patient still would like to leave [place].   Final Clinical Impression(s) / ED Diagnoses Final diagnoses:  Fall, initial encounter  Injury of head, initial encounter    Rx / DC Orders ED Discharge Orders    None       Bishop Dublin 11/18/19 2346    Truddie Hidden, MD 11/19/19 1240

## 2019-11-23 ENCOUNTER — Encounter: Payer: Medicare HMO | Admitting: Dermatology

## 2019-12-01 ENCOUNTER — Encounter: Payer: Self-pay | Admitting: Dermatology

## 2019-12-01 NOTE — Progress Notes (Signed)
   Follow-Up Visit   Subjective  Joe Murray is a 79 y.o. male who presents for the following: Procedure (SCC x 1 left lower leg).  SCCA Location: Left shin Duration:  Quality:  Associated Signs/Symptoms: Modifying Factors:  Severity:  Timing: Context: For treatment  Objective  Well appearing patient in no apparent distress; mood and affect are within normal limits.  A focused examination was performed including Legs.. Relevant physical exam findings are noted in the Assessment and Plan.   Assessment & Plan    Squamous cell carcinoma of skin Left Lower Leg - Anterior  Destruction of lesion Complexity: simple   Destruction method: electrodesiccation and curettage   Informed consent: discussed and consent obtained   Timeout:  patient name, date of birth, surgical site, and procedure verified Anesthesia: the lesion was anesthetized in a standard fashion   Anesthetic:  1% lidocaine w/ epinephrine 1-100,000 local infiltration Curettage performed in three different directions: Yes   Curettage cycles:  3 Lesion length (cm):  1.7 Lesion width (cm):  1 Margin per side (cm):  0 Final wound size (cm):  1.7 Hemostasis achieved with:  ferric subsulfate Outcome: patient tolerated procedure well with no complications   Post-procedure details: wound care instructions given   Additional details:  Inoculated with parenteral 5% fluorouracil   Surgical follow-up for Joe Murray date of birth 1941/01/01.  Biopsy on his left lower shin showed a well differentiated squamous cell carcinoma.  Curettage disclosed this to be rather superficial so it was curetted and treated with 5-FU inoculation rather than cut and stitch.  You can expect an abrasion on this area for 3 to 4weeks and the first week it will have a little pink halo and be a little sore.  Soap and water is fine; after bathing please apply a little plain Vaseline or if you are not allergic, Neosporin-triple antibiotic  ointment.  If you have any problem with the spot, my cell phone number is [phone].  If you call please leave a message and I will call you right back.  Even if is doing perfectly well, please call the office number in 3 to 4 weeks and leave me a message that you doing okay if all does well then I just want to do a general check on you 1 time in the winter.  I, Lavonna Monarch, MD, have reviewed all documentation for this visit.  The documentation on 12/01/19 for the exam, diagnosis, procedures, and orders are all accurate and complete.

## 2019-12-06 ENCOUNTER — Ambulatory Visit: Payer: Medicare HMO | Admitting: Cardiology

## 2019-12-25 ENCOUNTER — Other Ambulatory Visit: Payer: Self-pay | Admitting: Cardiology

## 2019-12-28 ENCOUNTER — Encounter: Payer: Self-pay | Admitting: Neurology

## 2019-12-28 ENCOUNTER — Other Ambulatory Visit: Payer: Self-pay

## 2019-12-28 ENCOUNTER — Ambulatory Visit: Payer: Medicare HMO | Admitting: Neurology

## 2019-12-28 VITALS — BP 155/71 | HR 45 | Ht 68.0 in | Wt 198.8 lb

## 2019-12-28 DIAGNOSIS — G20C Parkinsonism, unspecified: Secondary | ICD-10-CM

## 2019-12-28 DIAGNOSIS — G3184 Mild cognitive impairment, so stated: Secondary | ICD-10-CM

## 2019-12-28 DIAGNOSIS — G20 Parkinson's disease: Secondary | ICD-10-CM

## 2019-12-28 MED ORDER — MEMANTINE HCL 10 MG PO TABS: ORAL_TABLET | ORAL | 11 refills | Status: DC

## 2019-12-28 NOTE — Progress Notes (Signed)
NEUROLOGY FOLLOW UP OFFICE NOTE  Joe Murray [id number] 08/09/1940  HISTORY OF PRESENT ILLNESS: I had the pleasure of seeing Joe Murray in follow-up in the neurology clinic on 12/28/2019.  The patient was last seen 6 months ago for memory loss and parkinsonism. He is again accompanied by his wife who helps supplement the history today.  Records and images were personally reviewed where available.  MOCA blind in 10/2018 was 14/22. On his last visit, he was noted to have more bradykinesia and shuffling gait. A trial of Sinemet was prescribed. He reports today that he did not take it because it was causing him to feel bad. He states that 3-4 days after stopping it, he started feeling a lot better. He still feels pretty good now. His wife is surprised about this information and was under the impression he had been taking his medications regularly. He also reports he does not take the Glipizide daily because it does not make a difference. A pill count done today of Sinemet showed 67 tablets, he took it for less than a week. He feels the tremor is better, his wife still notices left hand tremor on and off. Tremor does not affect using utensils or drinking from cup. He states he does not have a problem walking. His wife has not noticed much change since last visit with mobility, but feels memory is getting a little worse. He is asking for medication for memory, he goes to the kitchen and forgets what he went for. He forgets what he was going to say. Wife reports he follows instructions pretty good. He drives minimally, she let him drive his truck yesterday and states he does okay with driving. He is independent with dressing and bathing but slow. He may be having hallucinations, sometimes he asks his wife if she sees something not there. No REM behavior disorder, sleep overall okay. He has some constipation. He had improvement in balance with physical therapy, but he has not been doing HEP. No further  falls since 11/18/19 when he was sitting on a bench at a wedding drinking alcohol and fell back, hitting his head on the chair and ground. Unclear if there was loss of consciousness. Head CT negative. His HR was in the 40s in the ER. His wife reports that he does not drink alcohol often, but when he does, he drinks more than 1 glass and gets more confused and gets drunk easily.   History on Initial Assessment 04/06/2018: This is a 79 year old right-handed man with a history of hypertension, hyperlipidemia, CAD, diabetes, presenting for evaluation of memory loss. He reports his memory is lousy. He started noticing memory changes around a year ago. His wife feels memory changes started a few years ago which she attributes to statin use, however in the past year, forgetfulness has worsened. He watches a lot of Westerns but would watch the same show 1-2 weeks later, his wife reminds him he has seen it already but he denies it. He forgets conversations. He does not remember people from the past, someone would pass away and he would not remember then. No significant difficulties following directions/instructions. His wife has not noticed significant memory decline in the past year. He drives minimally due to vision issues, and denies getting lost. His wife manages finances. He was previously on several medications that were stopped 2 months ago due to fatigue and memory concerns. He is only taking glipizide. He had muscle pains on statin in the past,  his wife feels the Repatha helped improve his cholesterol levels but also caused leg pain. He reports right leg pain mostly after walking a few steps, improving when he rests. His wife has noticed slowed movements, he would be very slow when opening things. He has a little trouble getting out of a chair and feels his balance is off when he first stands, but feels fine once he starts walking. His wife  has noticed over the past year that gait has slowed down and his stride  has been shorter. He speaks in a low voice and does not move his mouth much when talking, his wife reports he has always been this way. No major personality changes, no paranoia or hallucinations. No family history of dementia, no history of significant head injuries. He drinks alcohol once a week.  He has had headaches over the past year localized over the back of his left ear. He describes a throbbing pain that only occurs in the evening around 30-40 minutes after he sits in his recliner. It would start wearing off after a few hours and resolves by bedtime. He takes Tylenol every evening. No associated nausea/vomiting, photo/phonophobia. He has trouble seeing the words on TV and reports monocular diplopia in his right eye. He denies any dizziness, dysarthria/dysphagia, neck/back pain, focal numbness/tingling, bowel/bladder dysfunction, anosmia, no falls. Sleep is good, he has some daytime drowsiness and snores minimally. No REM behavior disorder noted by wife. He has had occasional left hand tremors the past year, he denies any difficulty writing with his right hand or using utensils. He is a retired Systems developer.   Diagnostic Data:  TSH normal, B12 low normal 312.  MRI brain without contrast done 03/2018 did not show any acute changes. There was mild to moderate diffuse atrophy and chronic microvascular disease.   PAST MEDICAL HISTORY: Past Medical History:  Diagnosis Date  . CAD S/P percutaneous coronary angioplasty 10/'03; 3/'09; 7/*09; 1/'17    a. Dr. Ellyn Hack; '03 - Cypher DES 3.0 mm 33 mm proximal-mid LAD (details D1 with ostial 60%); 3/'09 dRCA 2.75 mm x 13 mm Cypher DES (2.8 mm); 7/'09 pRCA 3.0 mm x 18 m Cypher DES (3.25 mm);; (2009) 2D Echo - EF 55%, (November 2014) nonischemic Myoview;; b. Promus DES to RCA and mid LAD 02/26/2015  . Chronotropic incompetence 12/2011   ETT 11/2017: Normal blood pressure response.  No EKG changes.  Exercise stopped due to fatigue and dyspnea.  Heart rate  increased to low 90s from 50s.  Suggest chronotropic incompetence.  Severely impaired exercise capacity. -->  Results reviewed with electrophysiology: Still not indication for pacemaker.  . Diabetes mellitus type 2 in obese (HCC)    CAD  . Diverticulosis   . Dyslipidemia, goal LDL below 70    on [employer] study medicaion.  . ED (erectile dysfunction)   . GERD (gastroesophageal reflux disease)   . Hemorrhoids 07/2002   Internal and External  . Insomnia   . Nodular basal cell carcinoma (BCC) 05/08/2019   left ear (cx38fu)  . Non-STEMI (non-ST elevated myocardial infarction) ([facility]) 11/2001   a. Proximal LAD tandem lesions -- long DES stent covering both;; b. Jan 2017: mRCA PCI, [place] PCI  . Persistent sinus bradycardia   . Personal history of colonic adenomas 09/06/2012  . Psoriasis    Resting HR in 54s  . SCC (squamous cell carcinoma) 11/13/2001   right upper outer arm (txpbx)  . SCC (squamous cell carcinoma) 11/13/2009   right upper ear rim (  [id number])  . SCC (squamous cell carcinoma) 01/20/2010   right nose (cx46fu)  . SCC (squamous cell carcinoma) 03/28/2014   right side of nose ( cx40fu)  . SCC (squamous cell carcinoma) 06/03/2016   right side of nose  . SCC (squamous cell carcinoma) 05/08/2019   right ear inferior (cx33fu) cis  . Spasm of esophagus   . Squamous cell carcinoma in situ (SCCIS) 05/08/2019   Right Ear Inferior  . Squamous cell carcinoma of skin 11/08/2001   top right rim ear   . SVT (supraventricular tachycardia) ([facility]) 2003  . Vertigo, benign positional     MEDICATIONS: Current Outpatient Medications on File Prior to Visit  Medication Sig Dispense Refill  . aspirin EC 81 MG tablet Take 81 mg by mouth daily.    . carbidopa-levodopa (SINEMET IR) 25-100 MG tablet Take 1/2 tablet three times a day with meals for a week, then increase to 1 tablet three times a day with meals (Patient taking differently: Take 1 tablet by mouth 3 (three) times daily. ) 90 tablet 5  .  Cholecalciferol (VITAMIN D3) 5000 UNITS CAPS Take 5,000 Units by mouth daily. 30 capsule 0  . Cinnamon 500 MG capsule Take 2,000 mg by mouth daily.     . fish oil-omega-3 fatty acids 1000 MG capsule Take 2 g by mouth daily.      Marland Kitchen glipiZIDE (GLUCOTROL) 5 MG tablet TAKE 1 TABLET BY MOUTH TWICE DAILY BEFORE A MEAL (Patient taking differently: Take 5 mg by mouth 2 (two) times daily before a meal. ) 180 tablet 3  . HYDROcodone-acetaminophen (NORCO/VICODIN) 5-325 MG tablet hydrocodone 5 mg-acetaminophen 325 mg tablet  Take 1 tablet every 8 hours by oral route. (Patient not taking: Reported on 11/18/2019)    . Lancets (BD LANCET ULTRAFINE 30G) MISC USE ONE  TO CHECK GLUCOSE ONCE DAILY 100 each 1  . MAGNESIUM CHLORIDE PO Take 1 tablet by mouth daily.      . mupirocin ointment (BACTROBAN) 2 % Apply 1 application topically 2 (two) times daily. 22 g 1  . nitroGLYCERIN (NITROSTAT) 0.4 MG SL tablet PLACE 1 TABLET UNDER THE TONGUE EVERY 5 MINUTES FOR 3 DOSES AS NEEDED FOR CHEST PAIN (Patient taking differently: Place 0.4 mg under the tongue every 5 (five) minutes as needed for chest pain. ) 25 tablet 6  . ONE TOUCH ULTRA TEST test strip USE ONE STRIP TO CHECK GLUCOSE ONCE DAILY 50 each 0  . potassium gluconate 595 MG TABS tablet Take 595 mg by mouth.    Marland Kitchen Omer 420 MG/3.5ML SOCT INJECT 420 MG INTO THE SKIN EVERY 30 DAYS 4 mL 11   No current facility-administered medications on file prior to visit.    ALLERGIES: Allergies  Allergen Reactions  . Beta Adrenergic Blockers Other (See Comments)    chronotropic incompetence  . Praluent [Alirocumab]     Diffuse Rash - improved when stopping & taking Benadryl  . Sulfonamide Derivatives     unknown  . Statins Other (See Comments)    REACTION: myalgias    FAMILY HISTORY: Family History  Problem Relation Age of Onset  . Diabetes Mother   . Hearing loss Father   . Dementia Father   . Heart attack Father 43       first MI prior to age 30   . Heart attack Brother     SOCIAL HISTORY: Social History   Socioeconomic History  . Marital status: Married    Spouse name: Not  on file  . Number of children: 4  . Years of education: Not on file  . Highest education level: Not on file  Occupational History  . Occupation: retired  Tobacco Use  . Smoking status: Current Every Day Smoker    Types: Cigars    Start date: 07/06/1952  . Smokeless tobacco: Never Used  . Tobacco comment: 1-2 per day; he says "I really don't inhale"  Vaping Use  . Vaping Use: Never used  Substance and Sexual Activity  . Alcohol use: Yes    Alcohol/week: 0.0 standard drinks    Comment: occ  . Drug use: No  . Sexual activity: Never  Other Topics Concern  . Not on file  Social History Narrative   He is a married, father of 81, grandfather 3.   He still smokes 2-3 cigars per day. States that he "does not really inhale ". He is not really all that it's including, stating that he wants to have his 1 remaining vice.   Exercises only on occasion.   He does various landscaping jobs including cutting wood, and clearing brush.      Pt is right handed   Lives in 2 story home with his wife   High school graduate   Retired Dealer   Social Determinants of Health   Financial Resource Strain:   . Difficulty of Paying Living Expenses: Not on file  Food Insecurity:   . Worried About Charity fundraiser in the Last Year: Not on file  . Ran Out of Food in the Last Year: Not on file  Transportation Needs:   . Lack of Transportation (Medical): Not on file  . Lack of Transportation (Non-Medical): Not on file  Physical Activity:   . Days of Exercise per Week: Not on file  . Minutes of Exercise per Session: Not on file  Stress:   . Feeling of Stress : Not on file  Social Connections:   . Frequency of Communication with Friends and Family: Not on file  . Frequency of Social Gatherings with Friends and Family: Not on file  . Attends Religious Services: Not  on file  . Active Member of Clubs or Organizations: Not on file  . Attends Archivist Meetings: Not on file  . Marital Status: Not on file  Intimate Partner Violence:   . Fear of Current or Ex-Partner: Not on file  . Emotionally Abused: Not on file  . Physically Abused: Not on file  . Sexually Abused: Not on file     PHYSICAL EXAM: Vitals:   12/28/19 0829  BP: (!) 155/71  Pulse: (!) 45  SpO2: 97%   General: No acute distress, hypomimia and hypophonia Head:  Normocephalic/atraumatic Skin/Extremities: No rash, no edema Neurological Exam: alert and oriented to person, place, and time. No aphasia or dysarthria. Fund of knowledge is appropriate.  Recent and remote memory are impaired.  Attention and concentration are normal. MMSE 26/30 MMSE - Mini Mental State Exam 12/28/2019  Orientation to time 4  Orientation to Place 5  Registration 3  Attention/ Calculation 5  Recall 0  Language- name 2 objects 2  Language- repeat 1  Language- follow 3 step command 3  Language- read & follow direction 1  Write a sentence 1  Copy design 1  Total score 26    Cranial nerves: Pupils equal, round. Extraocular movements intact with no nystagmus. Visual fields full.  No facial asymmetry.  Motor: Bulk and tone normal, no cogwheeling, muscle  strength 5/5 throughout with no pronator drift.   Finger to nose testing intact.  Gait improved compared to prior, ambulating without walker with small steps and reduced arm swing. Romberg negative. No postural instability. There is occasional resting left hand tremor, no postural or endpoint tremor. Reduced finger taps and foot taps L>R   IMPRESSION: This is a 79 yo RH man with a history of hypertension, hyperlipidemia, diabetes, CAD, with mild cognitive impairment and parkinsonism. MRI brain unremarkable with mild to moderate chronic microvascular disease. Gait improved compared to prior visit. He states he did not tolerate Sinemet, and main concern  today is memory. MMSE 26/30. He is agreeable to starting Memantine 10mg  qhs x 2 weeks, then increase to 10mg  BID. Side effects and expectations from medication discussed. If no side effects on Memantine, he can restart Sinemet 25/100mg  1/2 tab TID with meals. Wife advised to monitor medications behind him. He will be scheduled for Neurocognitive testing to further evaluate cognitive concerns. Resources for exercises and support groups provided today. Continue to monitor driving. Follow-up in 6-8 months, they know to call for any changes.   Thank you for allowing me to participate in his care.  Please do not hesitate to call for any questions or concerns.   Ellouise Newer, M.D.   CC: Dr. Sarajane Jews

## 2019-12-28 NOTE — Patient Instructions (Signed)
1. Schedule Neurocognitive testing  2. Start Memantine 10mg : Take 1 tablet every night for 2 weeks, then increase to 1 tablet daily  3. A month after starting the Memantine, if no side effects, start the Carbidopa/Levodopa: take 1/2 tablet three times a day with meals  4. Look into exercise programs, support groups  5. Follow-up in 6 months, call for any changes

## 2019-12-29 ENCOUNTER — Ambulatory Visit: Payer: Medicare HMO | Admitting: Cardiology

## 2020-01-01 ENCOUNTER — Other Ambulatory Visit: Payer: Self-pay

## 2020-01-01 ENCOUNTER — Ambulatory Visit: Payer: Medicare HMO | Admitting: Cardiology

## 2020-01-01 ENCOUNTER — Encounter: Payer: Self-pay | Admitting: Cardiology

## 2020-01-01 VITALS — BP 140/80 | HR 56 | Ht 68.5 in | Wt 198.6 lb

## 2020-01-01 DIAGNOSIS — R42 Dizziness and giddiness: Secondary | ICD-10-CM

## 2020-01-01 DIAGNOSIS — R5382 Chronic fatigue, unspecified: Secondary | ICD-10-CM | POA: Diagnosis not present

## 2020-01-01 DIAGNOSIS — Z9861 Coronary angioplasty status: Secondary | ICD-10-CM

## 2020-01-01 DIAGNOSIS — I10 Essential (primary) hypertension: Secondary | ICD-10-CM | POA: Diagnosis not present

## 2020-01-01 DIAGNOSIS — I4589 Other specified conduction disorders: Secondary | ICD-10-CM | POA: Diagnosis not present

## 2020-01-01 DIAGNOSIS — E785 Hyperlipidemia, unspecified: Secondary | ICD-10-CM

## 2020-01-01 DIAGNOSIS — I2510 Atherosclerotic heart disease of native coronary artery without angina pectoris: Secondary | ICD-10-CM | POA: Diagnosis not present

## 2020-01-01 DIAGNOSIS — I214 Non-ST elevation (NSTEMI) myocardial infarction: Secondary | ICD-10-CM | POA: Diagnosis not present

## 2020-01-01 NOTE — Progress Notes (Signed)
Primary Care Provider: Laurey Morale, MD Cardiologist: Glenetta Hew, MD Electrophysiologist: None  Clinic Note: Chief Complaint  Patient presents with  . Follow-up    Annual  . Coronary Artery Disease    No angina   HPI:    Joe Murray is a 79 y.o. male with a PMH notable for CAD-PCI with HTN HLD DM-2 along with longstanding Parkinson's disease and Progressive Dementia who presents today for annual follow-up.   CAD: Non-STEMI-PCI..-- s/p DES to mid RCA and DES to mid LAD on 02/26/2015  Problem List Items Addressed This Visit    Coronary artery disease involving native coronary artery of native heart without angina pectoris (Chronic)   Dyslipidemia, goal LDL below 70 (Chronic)   Chronotropic incompetence (Chronic)   Fatigue (Chronic)   Essential hypertension (Chronic)   NSTEMI (non-ST elevated myocardial infarction) ([place]) - Primary (Chronic)   CAD S/P percutaneous coronary angioplasty - LADx2, & RCA x 3     Joe Murray was last seen on November 30, 2018 (accompanied by his wife who helps provide answers for questions, he is no longer very reliable historian, and speaks very slowly and guttural very difficult to understand).  He was doing relatively well at that time.  Still fatigued all the time but a little bit better being off the medication.  Memory loss persistent but stable.  The low-grade better not being on Repatha.  Was happy to be off of medications besides aspirin.  Energy level improved, walking on treadmill at least 30 minutes a day and occasionally doing stationary bicycle (30 minutes) at the gym ([employer]) 3 days a week.  No chest pain or pressure with rest or exertion.  Blood pressures are labile ranging from low 100s to 180s.  Recent Hospitalizations:   November 18, 2019-[place], ER with fall: Per EMS, he was sitting on the front porch awake and alert--N/A wedding party.  Unfortunately fell backwards in his chair and hit his head on the the adjacent  chair.  He had loss of consciousness.  He had had some alcohol.  She was not aware of him having actually had a syncopal episode.  Thinks it was more that he fell backwards.  Unfortunately, he could not recall the episode.  Reviewed  CV studies:    The following studies were reviewed today: (if available, images/films reviewed: From Epic Chart or Care Everywhere) . None:   Interval History:   Joe Murray returns here today with his wife overall doing okay from cardiac standpoint.  He has lost some weight trying to at least.  Eating much better.  He is doing PT/OT off and on to help with his balance and walking.  He may get some shortness of breath when he does this, but not anything significant.  He is not really walking on the treadmill as much as he had been because his balance has been off.  He is still trying to [employer] some, but not 3 days a week that he has been doing.  About the only cardiac symptom he notes is that his palpitations are occasional but nothing worrisome-nothing prolonged.  Just a few skipped beats here and there.Marland Kitchen  No chest pain or pressure.  Mild exertional dyspnea but no different than before.  No heart failure symptoms of PND orthopnea or edema.   CV Review of Symptoms (Summary): positive for - dyspnea on exertion, palpitations and Balance issues with exercise fatigue.  Overall better though since no longer  on any blood pressure medications. negative for - chest pain, irregular heartbeat, orthopnea, paroxysmal nocturnal dyspnea, rapid heart rate, shortness of breath or syncope/near syncope (they do not think that the Sept episode was syncope), TIA/ amaurosis fugax, claudication  The patient does not have symptoms concerning for COVID-19 infection (fever, chills, cough, or new shortness of breath).   REVIEWED OF SYSTEMS   Review of Systems  Constitutional: Positive for malaise/fatigue (Chronic, but better.). Negative for weight loss (Weight is actually  stabilized out.  He had not been eating very much at all, and had lost weight.  Now normalized out again.).  HENT: Positive for hearing loss. Negative for congestion and nosebleeds.   Respiratory: Positive for shortness of breath (Only with exertion). Negative for cough and wheezing.   Gastrointestinal: Negative for blood in stool and melena.  Genitourinary: Negative for hematuria.  Musculoskeletal: Positive for back pain and joint pain. Negative for falls.  Neurological: Positive for dizziness (Poor balance.  Not really orthostatic). Negative for focal weakness.  Psychiatric/Behavioral: Positive for memory loss. Negative for depression. The patient is not nervous/anxious and does not have insomnia.    I have reviewed and (if needed) personally updated the patient's problem list, medications, allergies, past medical and surgical history, social and family history.   PAST MEDICAL HISTORY   Past Medical History:  Diagnosis Date  . CAD S/P percutaneous coronary angioplasty 10/'03; 3/'09; 7/*09; 1/'17    a. Dr. Ellyn Hack; '03 - Cypher DES 3.0 mm 33 mm proximal-mid LAD (details D1 with ostial 60%); 3/'09 dRCA 2.75 mm x 13 mm Cypher DES (2.8 mm); 7/'09 pRCA 3.0 mm x 18 m Cypher DES (3.25 mm);; (2009) 2D Echo - EF 55%, (November 2014) nonischemic Myoview;; b. Promus DES to RCA and mid LAD 02/26/2015  . Chronotropic incompetence 12/2011   ETT 11/2017: Normal blood pressure response.  No EKG changes.  Exercise stopped due to fatigue and dyspnea.  Heart rate increased to low 90s from 50s.  Suggest chronotropic incompetence.  Severely impaired exercise capacity. -->  Results reviewed with electrophysiology: Still not indication for pacemaker.  . Diabetes mellitus type 2 in obese (HCC)    CAD  . Diverticulosis   . Dyslipidemia, goal LDL below 70    on [employer] study medicaion.  . ED (erectile dysfunction)   . GERD (gastroesophageal reflux disease)   . Hemorrhoids 07/2002   Internal and External  .  Insomnia   . Nodular basal cell carcinoma (BCC) 05/08/2019   left ear (cx6fu)  . Non-STEMI (non-ST elevated myocardial infarction) ([place]) 11/2001   a. Proximal LAD tandem lesions -- long DES stent covering both;; b. Jan 2017: mRCA PCI, [place] PCI  . Persistent sinus bradycardia   . Personal history of colonic adenomas 09/06/2012  . Psoriasis    Resting HR in 54s  . SCC (squamous cell carcinoma) 11/13/2001   right upper outer arm (txpbx)  . SCC (squamous cell carcinoma) 11/13/2009   right upper ear rim (cx3 50fu)  . SCC (squamous cell carcinoma) 01/20/2010   right nose (cx62fu)  . SCC (squamous cell carcinoma) 03/28/2014   right side of nose ( cx13fu)  . SCC (squamous cell carcinoma) 06/03/2016   right side of nose  . SCC (squamous cell carcinoma) 05/08/2019   right ear inferior (cx65fu) cis  . Spasm of esophagus   . Squamous cell carcinoma in situ (SCCIS) 05/08/2019   Right Ear Inferior  . Squamous cell carcinoma of skin 11/08/2001   top right rim ear   .  SVT (supraventricular tachycardia) ([place]) 2003  . Vertigo, benign positional     PAST SURGICAL HISTORY   Past Surgical History:  Procedure Laterality Date  . CARDIAC CATHETERIZATION N/A 02/26/2015   Procedure: Left Heart Cath and Coronary Angiography;  Surgeon: Leonie Man, MD: mRCA 99% --> PCI. mLAD 75%-> FFR Guided PCI, Mod ISR in pLAD & pRCA  . CARDIAC CATHETERIZATION N/A 02/26/2015   Procedure: Coronary Stent Intervention;  Surgeon: Leonie Man, MD;  Location: [facility]: mRCA Promus Premier DES 3.0 x 12 (3.5), mLAD Promus Premier DES 2.75 x 20 (3.0)  . CARDIAC CATHETERIZATION N/A 02/26/2015   Procedure: Intravascular Pressure Wire/FFR Study;  Surgeon: Leonie Man, MD;  Location: [facility];  Service: Cardiovascular: LAD 75% - FFR + -> PCI  . CATARACT EXTRACTION  [id number]  . COLONOSCOPY  12/16/2017   per Dr. Carlean Purl, adenomatous polyps, no repeats due to age   . CORONARY ANGIOPLASTY WITH STENT  PLACEMENT  LAD - 2003, RCA 3 & 7/ '09   Cypher 3.0 x 32 mid LAD; Cyper 2.75 x 13 - distal RCA, 3.0 x 18 Prox RCA  . ESOPHAGOGASTRODUODENOSCOPY  08-11-02   esophageal dilation per Dr. Carlean Purl  . HERNIA REPAIR     right inguinal   . NM MYOVIEW LTD  Nov 2014   ~8 METS; EF 60%, no ischemia or infarction  . PERCUTANEOUS CORONARY STENT INTERVENTION (PCI-S)  11/23/2001   NSTEMI: Prox-Mid LAD tandem ~80% lesions on either side of D1 (with 80% lesion) -- Cypher DES 3.0 mm x 33 m (covering both lesions)   . PERCUTANEOUS CORONARY STENT INTERVENTION (PCI-S)  04/27/2007   Unstable Angina: Distal RCA 95%: Cypher 2.75x13  (2.8 mm); residual focal ~60%ISR in LAD stent, 60% D1 ostial (no PTCA on LAD due to no ischemia on ST)  . PERCUTANEOUS CORONARY STENT INTERVENTION (PCI-S)  09/21/2007   Bradycardia & Unstable Angina: Prox RCA 70% - PCI Cypher DES 3.0 mm x 18 mm  (3.25 mm); ostial 60-70% jailed D1. LAD & distal RCA stents patent  . SHOULDER SURGERY     left rotator cuff, Dr. Gladstone Lighter  . TRANSTHORACIC ECHOCARDIOGRAM  March 2009   Normal LV size and function, EF 55%. Mild MR; mild RV dilation.  . TRANSTHORACIC ECHOCARDIOGRAM  02/2015   EF 60-65%. Moderate concentric LVH. Normal function with normal regional wall motion. GR 1 DD  . WRIST FRACTURE SURGERY     right    Immunization History  Administered Date(s) Administered  . Fluad Quad(high Dose 65+) 11/04/2018, 11/07/2019  . Influenza Split 11/27/2010  . Influenza, High Dose Seasonal PF 12/18/2013, 01/02/2015, 12/26/2016, 12/19/2017  . Influenza-Unspecified 12/24/2012, 12/18/2013, 12/25/2014  . PFIZER SARS-COV-2 Vaccination 03/16/2019, 04/06/2019, 10/14/2019  . Pneumococcal Conjugate-13 08/16/2015  . Pneumococcal Polysaccharide-23 05/01/2009  . Zoster Recombinat (Shingrix) 12/19/2017    MEDICATIONS/ALLERGIES   Current Meds  Medication Sig  . aspirin EC 81 MG tablet Take 81 mg by mouth daily.  . carbidopa-levodopa (SINEMET IR) 25-100 MG tablet Take  1/2 tablet three times a day with meals for a week, then increase to 1 tablet three times a day with meals  . Cholecalciferol (VITAMIN D3) 5000 UNITS CAPS Take 5,000 Units by mouth daily.  . Cinnamon 500 MG capsule Take 2,000 mg by mouth daily.   . fish oil-omega-3 fatty acids 1000 MG capsule Take 2 g by mouth daily.    Marland Kitchen glipiZIDE (GLUCOTROL) 5 MG tablet TAKE 1 TABLET BY MOUTH TWICE DAILY  BEFORE A MEAL  . Lancets (BD LANCET ULTRAFINE 30G) MISC USE ONE  TO CHECK GLUCOSE ONCE DAILY  . MAGNESIUM CHLORIDE PO Take 1 tablet by mouth daily.    . memantine (NAMENDA) 10 MG tablet Take 1 tablet every night for 2 weeks, then increase to 1 tablet twice a day  . nitroGLYCERIN (NITROSTAT) 0.4 MG SL tablet PLACE 1 TABLET UNDER THE TONGUE EVERY 5 MINUTES FOR 3 DOSES AS NEEDED FOR CHEST PAIN  . ONE TOUCH ULTRA TEST test strip USE ONE STRIP TO CHECK GLUCOSE ONCE DAILY  . potassium gluconate 595 MG TABS tablet Take 595 mg by mouth.  Marland Kitchen [place] 420 MG/3.5ML SOCT INJECT 420 MG INTO THE SKIN EVERY 30 DAYS    Allergies  Allergen Reactions  . Beta Adrenergic Blockers Other (See Comments)    chronotropic incompetence  . Praluent [Alirocumab]     Diffuse Rash - improved when stopping & taking Benadryl  . Sulfonamide Derivatives     unknown  . Statins Other (See Comments)    REACTION: myalgias    SOCIAL HISTORY/FAMILY HISTORY   Reviewed in Epic:  Pertinent findings: Not as active - poor balance.  Doing PT/OT.  Not walking as much.  + Has worked on adjusting his diet.   OBJCTIVE -PE, EKG, labs   Wt Readings from Last 3 Encounters:  01/01/20 198 lb 9.6 oz (90.1 kg)  12/28/19 198 lb 12.8 oz (90.2 kg)  11/07/19 198 lb 6.4 oz (90 kg)    Physical Exam: BP 140/80   Pulse (!) 56   Ht 5' 8.5" (1.74 m)   Wt 198 lb 9.6 oz (90.1 kg)   SpO2 96%   BMI 29.76 kg/m  Physical Exam Constitutional:      General: He is not in acute distress.    Appearance: Normal appearance. He is not  ill-appearing or toxic-appearing.     Comments: Healthy-appearing.  Borderline obese.  HENT:     Head: Normocephalic and atraumatic.     Ears:     Comments: Very hard of hearing Neck:     Vascular: No carotid bruit, hepatojugular reflux or JVD.  Cardiovascular:     Rate and Rhythm: Regular rhythm. Bradycardia present.  No extrasystoles are present.    Chest Wall: PMI is not displaced.     Pulses: Intact distal pulses. Decreased pulses (Mildly decreased pedal pulses.).     Heart sounds: Normal heart sounds. No murmur heard.  No friction rub. No gallop. No S4 sounds.   Pulmonary:     Effort: Pulmonary effort is normal. No respiratory distress.     Breath sounds: Normal breath sounds.  Chest:     Chest wall: No tenderness.  Musculoskeletal:        General: No swelling. Normal range of motion.     Cervical back: Normal range of motion. Rigidity (Chronic) present.  Neurological:     General: No focal deficit present.     Mental Status: He is alert and oriented to person, place, and time. Mental status is at baseline.  Psychiatric:        Mood and Affect: Mood normal.        Behavior: Behavior normal.        Thought Content: Thought content normal.        Judgment: Judgment normal.     Comments: Slow steady speech.  However he does answer questions quite well.      Adult ECG Report Not checked  Recent  Labs: Reviewed Lab Results  Component Value Date   CHOL 136 11/07/2019   HDL 62 11/07/2019   LDLCALC 58 11/07/2019   LDLDIRECT 85.5 06/27/2012   TRIG 76 11/07/2019   CHOLHDL 2.2 11/07/2019   Lab Results  Component Value Date   CREATININE 1.26 (H) 11/07/2019   BUN 24 11/07/2019   NA 138 11/07/2019   K 4.5 11/07/2019   CL 107 11/07/2019   CO2 24 11/07/2019   Lab Results  Component Value Date   TSH 1.57 11/07/2019    ASSESSMENT/PLAN    Problem List Items Addressed This Visit    CAD S/P percutaneous coronary angioplasty - LADx2, & RCA x 3 (Chronic)    Last PCI was  in 2017.  Stents probably healed by now.  He is 4 1/2 years out.  Now only on aspirin.  No longer on Plavix because of bleeding bruising.      Coronary artery disease involving native coronary artery of native heart without angina pectoris - Primary (Chronic)    Besides having chronic fatigue, no real significant cardiac symptoms.  He did feel better when he stopped his medications therefore I am reluctant to put her back on antihypertensives.  Beta-blocker stopped because of sinus bradycardia and chronotropic incompetence.  We are fine to take him off Imdur and he is not really had any change in symptoms.  Only rare palpitations, would like sedation be to leave him off medications besides his aspirin and Repatha.      Dyslipidemia, goal LDL below 70 (Chronic)    Pretty much per his right wife's request.  We did get him back on Repatha Pushtronex which is a once monthly injection.  LDL well within goal.      Chronotropic incompetence (Chronic)    My his last GXT, he was still able to get his HR up to 90 bpm.  Continue to follow. Avoid AV nodal agents. Continue to walk.  Monitor for progressive signs of exertional fatigue.      Fatigue (Chronic)    I suspect that this is partly related to his Parkinson's and age.  He is off of all medications and potentially causes.      Essential hypertension -> allowing permissive hypertension.  Medication stopped because of fatigue and orthostatic dizziness. (Chronic)    He has a history of labile blood pressures, we have basically taken him off all medications to control blood pressure because he has had low pressures and high, with a lot of he gets orthostatic and has falls, with high pressures he has headaches, but no other significant symptoms.  The plan in the past was for him to take amlodipine for blood pressures greater than 170 mmHg.  He has not really had any of those pressure of the levels and therefore is not taking any medications.       Vertigo with mild postural lightheadedness. (Chronic)    Combination of vertigo and orthostatic hypotension in the past.  At this point we are avoiding hypotension, and stopped antihypertensives none of the permissive hypertension.      NSTEMI (non-ST elevated myocardial infarction) (HCC) (Chronic)    Very distant history of non-STEMI.  Preserved EF on echo with no regional wall motion abnormalities.  No angina or heart failure symptoms.          COVID-19 Education: The signs and symptoms of COVID-19 were discussed with the patient and how to seek care for testing (follow up with PCP or arrange E-visit).  The importance of social distancing and COVID-19 vaccination was discussed today. 2 min The patient is practicing social distancing & Masking.   I spent a total of 62minutes with the patient spent in direct patient consultation.  Additional time spent with chart review  / charting (studies, outside notes, etc): 8 Total Time: 43 min   Current medicines are reviewed at length with the patient today.  (+/- concerns) n/a  This visit occurred during the SARS-CoV-2 public health emergency.  Safety protocols were in place, including screening questions prior to the visit, additional usage of staff PPE, and extensive cleaning of exam room while observing appropriate contact time as indicated for disinfecting solutions.  Notice: This dictation was prepared with Dragon dictation along with smaller phrase technology. Any transcriptional errors that result from this process are unintentional and may not be corrected upon review.  Patient Instructions / Medication Changes & Studies & Tests Ordered   Patient Instructions  Medication Instructions:  NOT NEEDED  *If you need a refill on your cardiac medications before your next appointment, please call your pharmacy*   Lab Work: NOT NEEDED    Testing/Procedures:  NOT NEEDED  Follow-Up: At [facility], you and your health needs are  our priority.  As part of our continuing mission to provide you with exceptional heart care, we have created designated Provider Care Teams.  These Care Teams include your primary Cardiologist (physician) and Advanced Practice Providers (APPs -  Physician Assistants and Nurse Practitioners) who all work together to provide you with the care you need, when you need it.     Your next appointment:   12 month(s)  The format for your next appointment:   In Person  Provider:   Glenetta Hew, MD   Other Instructions  kEEP ACTIVE     Studies Ordered:   No orders of the defined types were placed in this encounter.    Glenetta Hew, M.D., M.S. Interventional Cardiologist   Pager # [phone] Phone # [phone] [address]. [place], [place] [address]   Thank you for choosing Heartcare at [facility]!!

## 2020-01-01 NOTE — Patient Instructions (Signed)
Medication Instructions:  NOT NEEDED  *If you need a refill on your cardiac medications before your next appointment, please call your pharmacy*   Lab Work: NOT NEEDED    Testing/Procedures:  NOT NEEDED  Follow-Up: At [facility], you and your health needs are our priority.  As part of our continuing mission to provide you with exceptional heart care, we have created designated Provider Care Teams.  These Care Teams include your primary Cardiologist (physician) and Advanced Practice Providers (APPs -  Physician Assistants and Nurse Practitioners) who all work together to provide you with the care you need, when you need it.     Your next appointment:   12 month(s)  The format for your next appointment:   In Person  Provider:   Glenetta Hew, MD   Other Instructions  kEEP ACTIVE

## 2020-01-12 ENCOUNTER — Encounter: Payer: Self-pay | Admitting: Cardiology

## 2020-01-12 NOTE — Assessment & Plan Note (Signed)
Combination of vertigo and orthostatic hypotension in the past.  At this point we are avoiding hypotension, and stopped antihypertensives none of the permissive hypertension.

## 2020-01-12 NOTE — Assessment & Plan Note (Signed)
I suspect that this is partly related to his Parkinson's and age.  He is off of all medications and potentially causes.

## 2020-01-12 NOTE — Assessment & Plan Note (Signed)
Pretty much per his right wife's request.  We did get him back on Repatha Pushtronex which is a once monthly injection.  LDL well within goal.

## 2020-01-12 NOTE — Assessment & Plan Note (Signed)
Besides having chronic fatigue, no real significant cardiac symptoms.  He did feel better when he stopped his medications therefore I am reluctant to put her back on antihypertensives.  Beta-blocker stopped because of sinus bradycardia and chronotropic incompetence.  We are fine to take him off Imdur and he is not really had any change in symptoms.  Only rare palpitations, would like sedation be to leave him off medications besides his aspirin and Repatha.

## 2020-01-12 NOTE — Assessment & Plan Note (Signed)
My his last GXT, he was still able to get his HR up to 90 bpm.  Continue to follow. Avoid AV nodal agents. Continue to walk.  Monitor for progressive signs of exertional fatigue.

## 2020-01-12 NOTE — Assessment & Plan Note (Signed)
Last PCI was in 2017.  Stents probably healed by now.  He is 4 1/2 years out.  Now only on aspirin.  No longer on Plavix because of bleeding bruising.

## 2020-01-12 NOTE — Assessment & Plan Note (Signed)
He has a history of labile blood pressures, we have basically taken him off all medications to control blood pressure because he has had low pressures and high, with a lot of he gets orthostatic and has falls, with high pressures he has headaches, but no other significant symptoms.  The plan in the past was for him to take amlodipine for blood pressures greater than 170 mmHg.  He has not really had any of those pressure of the levels and therefore is not taking any medications.

## 2020-01-12 NOTE — Assessment & Plan Note (Signed)
Very distant history of non-STEMI.  Preserved EF on echo with no regional wall motion abnormalities.  No angina or heart failure symptoms.

## 2020-02-19 ENCOUNTER — Encounter: Payer: Medicare HMO | Admitting: Counselor

## 2020-02-21 ENCOUNTER — Ambulatory Visit (INDEPENDENT_AMBULATORY_CARE_PROVIDER_SITE_OTHER): Payer: Medicare HMO | Admitting: Counselor

## 2020-02-21 ENCOUNTER — Ambulatory Visit: Payer: Medicare HMO

## 2020-02-21 ENCOUNTER — Encounter: Payer: Self-pay | Admitting: Counselor

## 2020-02-21 ENCOUNTER — Other Ambulatory Visit: Payer: Self-pay

## 2020-02-21 DIAGNOSIS — F09 Unspecified mental disorder due to known physiological condition: Secondary | ICD-10-CM

## 2020-02-21 DIAGNOSIS — R413 Other amnesia: Secondary | ICD-10-CM

## 2020-02-21 DIAGNOSIS — G20 Parkinson's disease: Secondary | ICD-10-CM | POA: Diagnosis not present

## 2020-02-21 DIAGNOSIS — R69 Illness, unspecified: Secondary | ICD-10-CM | POA: Diagnosis not present

## 2020-02-21 NOTE — Progress Notes (Signed)
   Psychometrist Note   Cognitive testing was administered to Joe Murray by Lamar Benes, B.S. (Technician) under the supervision of Alphonzo Severance, Psy.D., ABN. [name] was able to tolerate all test procedures. Dr. Nicole Kindred met with the patient as needed to manage any emotional reactions to the testing procedures. Rest breaks were offered.    The battery of tests administered was selected by Dr. Nicole Kindred with consideration to the patient's current level of functioning, the nature of his symptoms, emotional and behavioral responses during the interview, level of literacy, observed level of motivation/effort, and the nature of the referral question. This battery was communicated to the psychometrist. Communication between Dr. Nicole Kindred and the psychometrist was ongoing throughout the evaluation and Dr. Nicole Kindred was immediately accessible at all times. Dr. Nicole Kindred provided supervision to the technician on the date of this service, to the extent necessary to assure the quality of all services provided.    Mr. Petrich will return in approximately one week for an interactive feedback session with Dr. Nicole Kindred, at which time test performance, clinical impressions, and treatment recommendations will be reviewed in detail. The patient understands he can contact our office should he require our assistance before this time.   A total of 120 minutes of billable time were spent with Joe Murray by the technician, including test administration and scoring time. Billing for these services is reflected in Dr. Les Pou note.   This note reflects time spent with the psychometrician and does not include test scores, clinical history, or any interpretations made by Dr. Nicole Kindred. The full report will follow in a separate note.

## 2020-02-21 NOTE — Progress Notes (Signed)
NEUROPSYCHOLOGICAL EVALUATION [facility]  Patient Name: Joe Murray MRN: [id number] Date of Birth: 05/25/1940 Age: 79 y.o. Education: 12 years  Referral Circumstances and Background Information  Joe Murray is a 79 y.o., right-hand dominant, married man with a history of memory loss over the past several years as per his wife, CAD, HTN, HLD, and Parkinsonism (L > R tremor, reduced RAM, abnormal gait). He was referred for evaluation by my neurology colleague Joe Murray who recently started him on Namenda for his memory problems.   On interview, the patient reported that he has had memory problems as long as he can remember and they do not bother him. His wife Joe Murray agrees that his memory has never been great, although about 2 years ago, she noticed it was getting worse. The first changes noticed involved mainly forgetfulness and disorganization, he has a hard time tracking his schedule and forgets what he is doing mid task. She thinks it has been worsening over time to some extent. He doesn't have rapid forgetting of information, he doesn't forget entire events, and they are not sure if his memory is impaired to a degree that it impacts his day-to-day activities. He does not have notable difficulties with orientation or with word finding but it does take him a while to figure out what he is going to say and he can lose his train of thought easily. He has a hard time using devices, such as his wife's car, he told her a few weeks ago that he couldn't remember how to start it (he used to be a Curator). He used to do a lot of work on cars and has forgotten how to do things that he has done "all his life," which friends have noted. There are no delusions or clear hallucinations. He will think that he sees groundhogs in the backyard and his wife can't see them but they are denying any clear formed visual hallucinations. With respect to mood, he reported that he is doing well. He stated  that his energy is low, however, and his wife stated that he sleeps more during the day and falls asleep easily. His children noticed that when they visited for Christmas, he will sleep 2 or 3 hours during the day unless his wife wakes him up. At night, he will talk in his sleep, and he was talking quite loudly the other week. This is not often. His appetite is good and he has gained a bit of weight lately.   With respect to functioning, he hasn't been driving as much over the past year. He says he doesn't like cars and prefers not to drive and denied that it is a result of impairment. His wife said that she drove with him the last week and he drove so slow it was worrisome. His wife was letting him manage his own medications but after their last appointment with Joe Murray, she realized that he wasn't taking them reliably. His wife has always managed the finances, she isn't sure how he does with handling small sums of money because he doesn't want to go to the store. His wife thinks it is "intimidating" to him. He is able to use a computer but doesn't do much with it. He doesn't cook. He has no complex hobbies, he likes to watch movies and watch the stock market but has few other activities. There are no issues with attentiveness to hygiene or basic self-care. With respect to judgment and problem solving,  he leaves most of those things to his wife these days. In general, his repertoire is very limited and that may be masking his level of impairment. He essentially never does things independently out in the community, which he says is because he doesn't like stores.   Past Medical History and Review of Relevant Studies   Patient Active Problem List   Diagnosis Date Noted  . Intermittent claudication ([place]) 12/01/2017  . Trochanteric bursitis of right hip 09/04/2017  . Fuchs' corneal dystrophy 08/14/2016  . Essential hypertension -> allowing permissive hypertension.  Medication stopped because of fatigue  and orthostatic dizziness. 03/05/2015  . NSTEMI (non-ST elevated myocardial infarction) ([place])   . Coronary artery disease involving native coronary artery of native heart without angina pectoris   . Vitamin D deficiency 07/04/2014  . Fatigue 01/08/2014  . Obesity (BMI 30-39.9) 01/12/2013  . DM (diabetes mellitus), type 2 with complications - CAD   . Dyslipidemia, goal LDL below 70   . Exertional shortness of breath 01/12/2012  . Chronotropic incompetence 12/25/2011  . Vertigo with mild postural lightheadedness. 08/13/2009  . ERECTILE DYSFUNCTION, ORGANIC 05/01/2009  . GERD 04/18/2008  . BPH with urinary obstruction 07/27/2007  . CAD S/P percutaneous coronary angioplasty - LADx2, & RCA x 3 07/12/2001    Class: History of    Review of Neuroimaging and Relevant Medical History: Joe Murray has an MRI of the brain from 04/17/2019, which shows mild+ leukoaraiosis and a mild, generalized burden of atrophy, that may be just a bit more noticeable in the posterior portions of the brain. The extent of leukoaraiosis is perhaps enough to contribute to but not enough to cause a dementia level of function. The imaging is nondiagnostic.   Current Outpatient Medications  Medication Sig Dispense Refill  . aspirin EC 81 MG tablet Take 81 mg by mouth daily.    . carbidopa-levodopa (SINEMET IR) 25-100 MG tablet Take 1/2 tablet three times a day with meals for a week, then increase to 1 tablet three times a day with meals 90 tablet 5  . Cholecalciferol (VITAMIN D3) 5000 UNITS CAPS Take 5,000 Units by mouth daily. 30 capsule 0  . Cinnamon 500 MG capsule Take 2,000 mg by mouth daily.     . fish oil-omega-3 fatty acids 1000 MG capsule Take 2 g by mouth daily.      Marland Kitchen glipiZIDE (GLUCOTROL) 5 MG tablet TAKE 1 TABLET BY MOUTH TWICE DAILY BEFORE A MEAL 180 tablet 3  . Lancets (BD LANCET ULTRAFINE 30G) MISC USE ONE  TO CHECK GLUCOSE ONCE DAILY 100 each 1  . MAGNESIUM CHLORIDE PO Take 1 tablet by mouth daily.       . memantine (NAMENDA) 10 MG tablet Take 1 tablet every night for 2 weeks, then increase to 1 tablet twice a day 60 tablet 11  . nitroGLYCERIN (NITROSTAT) 0.4 MG SL tablet PLACE 1 TABLET UNDER THE TONGUE EVERY 5 MINUTES FOR 3 DOSES AS NEEDED FOR CHEST PAIN 25 tablet 6  . ONE TOUCH ULTRA TEST test strip USE ONE STRIP TO CHECK GLUCOSE ONCE DAILY 50 each 0  . potassium gluconate 595 MG TABS tablet Take 595 mg by mouth.    Marland Kitchen Lewis 420 MG/3.5ML SOCT INJECT 420 MG INTO THE SKIN EVERY 30 DAYS 4 mL 11   No current facility-administered medications for this visit.   Family History  Problem Relation Age of Onset  . Diabetes Mother   . Hearing loss Father   . Dementia  Father   . Heart attack Father 5       first MI prior to age 37  . Heart attack Brother    There is no  family history of dementia. There is a family history of psychiatric illness. He had a brother who committed suicide and one who was "mentally disturbed and had a lot of problems."   Psychosocial History  Developmental, Educational and Employment History: The patient reported a normal childhood development with no abuse or neglect. He reported that he was "middle of the road" in school, his wife said he was good with math and accounting. 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 He retired in 2011.   Psychiatric History: He had one brief episode of depression and took medication for it for a year or so, related to problems at work. Otherwise he has no mental health history.   Substance Use History: The patient smokes several cigars a day. He used to smoke cigarettes when he was younger, he wasn't sure when he quit. The patient was a bit vague about his alcohol use, he said he will get "a little tipped" once in a while. His wife reported this is infrequent.   Relationship History and Living Cimcumstances: The patient and his wife have been married for nearly 65  years, they have three children who were home for the holidays, they have noticed the changes.   Mental Status and Behavioral Observations  Sensorium/Arousal: The patient's level of arousal was awake and alert. Hearing and vision were adequate for testing purposes. Orientation: The patient was alert and generally oriented to person, place, time, and situation although he was off on the specific date.  Appearance: Dressed in appropriate casual clothing with reasonable grooming and hygiene Behavior: In general, the patient was hypokinetic, flat, and minimally interactive unless interacted with Speech/language: Speech was normal in rate, rhythm, volume, and prosody.  Gait/Posture: Small steps, shuffling, decreased arm swing particularly on left, taking many steps to turn Movement: Hypokinetic with hypomimia Social Comportment: Minimally interactive, appropriate within social norms Mood: The patient reported his mood is "fine" Affect: Hypomimia Thought process/content: Thought process was at times circumstantial, he forgot what he was saying and lost his train of thought on several occasions. There were no delusions or hallucinations.  Safety: No thoughts of harming self or others identified in this apathetic patient Insight: Impaired  MMSE - Mini Mental State Exam 02/21/2020 12/28/2019  Orientation to time 3 4  Orientation to Place 5 5  Registration 3 3  Attention/ Calculation 3 5  Recall 1 0  Language- name 2 objects 2 2  Language- repeat 1 1  Language- follow 3 step command 3 3  Language- read & follow direction 1 1  Write a sentence 1 1  Copy design 1 1  Total score 24 26   Test Procedures  Wide Range Achievement Test - 4             Word Reading Neuropsychological Assessment Battery  List Learning  Story Learning  Daily Living Memory  Naming  Digit Span Repeatable Battery for the Assessment of Neuropsychological Status (Form A)  Figure Copy  Judgment of Line  Orientation  Coding  Figure Recall The Dot Counting Test A Random Letter Test Controlled Oral Word Association (F-A-S) Semantic Fluency (Animals) Trail Making Test A & B Complex Ideational Material Modified [place] Card Sorting Test Geriatric Depression Scale -  Short Form Quick Dementia Rating System (completed by wife, Joe Murray)  Plan  Joe Murray was seen for a psychiatric diagnostic evaluation and neuropsychological testing. He is a pleasant if not somewhat apathetic 79 year old gentleman with a history of memory loss over the past 2 years and cerebrovascular risk factors. His routine is rather limited, although he is starting to have functional difficulties. 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 He has some Parkinsonism on exam and is scoring in the MCI to mild dementia range on screening today. Full and complete note with impressions, recommendations, and interpretation of test data to follow.   Joe Murray Joe Kindred, PsyD, [place] Clinical Neuropsychologist  Informed Consent and Coding/Compliance  Risks and benefits of the evaluation were discussed with the patient prior to all testing procedures. I conducted a clinical interview and neuropsychological testing (at least two tests) with Joe Murray and Joe Murray, B.S. (Technician) administered additional test procedures. The patient was able to tolerate the testing procedures and the patient (and/or family if applicable) is likely to benefit from further follow up to receive the diagnosis and treatment recommendations, which will be rendered at the next encounter. Billing below reflects technician time, my direct face-to-face time with the patient, time spent in test administration, and time spent in professional activities including but not limited to: neuropsychological test interpretation,  integration of neuropsychological test data with clinical history, report preparation, treatment planning, care coordination, and review of diagnostically pertinent medical history or studies.   Services associated with this encounter: Clinical Interview [phone]) plus 60 minutes [id number]; Neuropsychological Evaluation by Professional)  120 minutes [id number]; Neuropsychological Evaluation by Professional, Adl.) 16 minutes [id number]; Test Administration by Professional) 30 minutes [id number]; Neuropsychological Testing by Technician) 90 minutes [id number]; Neuropsychological Testing by Technician, Adl.)

## 2020-02-22 NOTE — Progress Notes (Signed)
NEUROPSYCHOLOGICAL TEST SCORES [facility]  Patient Name: Joe Murray MRN: [id number] Date of Birth: [birth year] Age: 79 y.o. Education: 12 years  Measurement properties of test scores: IQ, Index, and Standard Scores (SS): Mean = 100; Standard Deviation = 15 Scaled Scores (Ss): Mean = 10; Standard Deviation = 3 Z scores (Z): Mean = 0; Standard Deviation = 1 T scores (T); Mean = 50; Standard Deviation = 10  TEST SCORES:    Note: This summary of test scores accompanies the interpretive report and should not be interpreted by unqualified individuals or in isolation without reference to the report. Test scores are relative to age, gender, and educational history as available and appropriate.   Performance Validity        "A" Random Letter Test Raw  Descriptor      Errors 14 Below Expectation  The Dot Counting Test: 17 Below Expectation      Mental Status Screening     Total Score Descriptor  MMSE 24 MCI      Expected Functioning        Wide Range Achievement Test: Standard/Scaled Score Percentile      Word Reading 93 32      Attention/Processing Speed        Neuropsychological Assessment Battery (Attention Module, Form 1): T-score Percentile      Digits Forward 56 73      Digits Backwards 40 16      Repeatable Battery for the Assessment of Neuropsychological Status (Form A): Scaled Score Percentile      Coding 2 <1      Language        Neuropsychological Assessment Battery (Language Module, Form 1): T-score Percentile      Naming   (27) 43 25      Verbal Fluency: T-score Percentile      Controlled Oral Word Association (F-A-S) 32 4      Semantic Fluency (Animals) 33 5      Memory:        Neuropsychological Assessment Battery (Memory Module, Form 1): T-score Percentile      List Learning           List A Immediate Recall   (1, 2, 2) 23 <1         List B Immediate Recall   (1) 36 8         List A Short Delayed Recall   (2) 37 9         List A Long  Delayed Recall   (0) 35 7         List A Percent Retention   (0 %) --- 2         List A Long Delayed Yes/No Recognition Hits   (3) --- <1         List A Long Delayed Yes/No Recognition False Alarms   (8) --- 25         List A Recognition Discriminability Index --- <1     Story Learning           Immediate Recall   (0, 7) 21 <1         Delayed Recall   (0) 36 8         Percent Retention   (0 %) --- <1      Daily Living Memory            Immediate Recall   (11, 11) 35 7  Delayed Recall   (0, 0) 24 <1          Percent Retention (0 %) --- <1          Recognition Hits    (6) --- 8      Repeatable Battery for the Assessment of Neuropsychological Status (Form A): Scaled Score Percentile         Figure Recall   (0) 1 <1      Visuospatial/Constructional Functioning        Repeatable Battery for the Assessment of Neuropsychological Status (Form A): Standard/Scaled Score Percentile     Visuospatial/Constructional Index 78 7         Figure Copy   (16) 8 25         Judgment of Line Orientation   (11) --- 3-9      Executive Functioning        Modified [employer] Test (MWCST): Standard/T-Score Percentile      Number of Categories Correct 19 <1      Number of Perseverative Errors 47 38      Number of Total Errors 32 4      Percent Perseverative Errors 61 86  Executive Function Composite 72 3      Trail Making Test: T-Score Percentile      Part A 22 <1      Part B 25 1      [place] Diagnostic Aphasia Exam: Raw Score Scaled Score      Complex Ideational Material 9 5      Clock Drawing Raw Score Descriptor      Command 9 WNL      Rating Scales        Clinical Dementia Rating Raw Score Descriptor      Sum of Boxes 4.0 Very Mild Dementia      Global Score 1.0 Mild Dementia      Quick Dementia Rating System Raw Score Descriptor      Sum of Boxes 4.5 Mild Dementia      Total Score 5 MCI  Geriatric Depression Scale - Short Form 5 Negative   Aleiya Rye V. Roseanne Reno PsyD,  ABN Clinical Neuropsychologist

## 2020-02-26 ENCOUNTER — Encounter: Payer: Medicare HMO | Admitting: Counselor

## 2020-02-27 ENCOUNTER — Ambulatory Visit (INDEPENDENT_AMBULATORY_CARE_PROVIDER_SITE_OTHER): Payer: Medicare HMO | Admitting: Counselor

## 2020-02-27 ENCOUNTER — Encounter: Payer: Self-pay | Admitting: Counselor

## 2020-02-27 ENCOUNTER — Other Ambulatory Visit: Payer: Self-pay

## 2020-02-27 DIAGNOSIS — G301 Alzheimer's disease with late onset: Secondary | ICD-10-CM

## 2020-02-27 DIAGNOSIS — G20 Parkinson's disease: Secondary | ICD-10-CM | POA: Diagnosis not present

## 2020-02-27 DIAGNOSIS — F0281 Dementia in other diseases classified elsewhere with behavioral disturbance: Secondary | ICD-10-CM | POA: Diagnosis not present

## 2020-02-27 NOTE — Progress Notes (Signed)
[facility]  Patient Name: Joe Murray MRN: [id number] Date of Birth: Jan 25, 1941 Age: 80 y.o. Education: 55 years  Clinical Impressions  Joe Murray is a 80 y.o., right-hand dominant, married man with a history of cognitive problems noticed mainly by his wife, about two years ago. The first changes noticed were disorganization and forgetfulness and his difficulties have been worsening over time. He has some Parkinsonism on exam, and he may have formed visual hallucinations and RBD but if so, they are not very conspicuous. They reported minimal functional impairment although he has essentially stopped doing any instrumental activities at all and has no complex hobbies, allegedly because he is feeling lazy. 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 He has an MRI with mild + leukoaraiosis and some mild volume loss, just a bit more noticeable in the posterior portions of the brain.   Neuropsychological testing shows memory storage type problems, executive dysfunction, diminished processing efficiency, and perhaps also some mild dysfunction in visuospatial/constructional. He had globally diminished verbal fluency but no naming problems. He scored in the borderline positive range for depressive symptoms, although he endorses essentially no subjective symptoms of depression and thus, the elevation may reflect his apathy and some boredom. He was characterized as functioning at a mild dementia to MCI level by his wife. I think dementia is the best severity rating considering his restricted routine, which is likely masking some impairment.    Joe Murray is thus manifesting what appears to be a clinical dementia syndrome, currently at a mild level of progression. My sense is  that Alzheimer's is most likely the primary problem given his demographics, the high base rate of the disorder, and his memory impairment. His Parkinsonism and possible other signs/symptoms (RBD/hallucinations) suggest that there may also be a Lewy Body Spectrum Component. A more minor vascular contributor is possible but that is unlikely a sole cause. He is already on Namenda. Would recommend that he increase his activity level, which I will discuss with him and his wife. He may also benefit from increasing exercise, initiating a MIND DASH diet, and cultivating more satisfying social relationships. Driving safety will need to be carefully monitored as his wife is starting to notice changes.   Diagnostic Impressions: Alzheimer's dementia, late onset, with behavioral disturbance R/o Lewy Body Disease  Recommendations to be discussed with patient  Your performance and presentation on assessment were consistent with impairment in several different areas, including on measures of processing speed, memory, and executive function. There were more minor issues with visuospatial/constructional functioning, which involves processing visual information. Given that you are starting to have some difficulties with day-to-day activities, I think that dementia is the best term for the level of problem you are demonstrating.   Dementia refers to a group of syndromes where multiple areas of ability are damaged in the brain, such as memory, thinking, judgment, and behavior, and most commonly refers to age related causes of dementia that cause worsening in these abilities over time. Alzheimer's disease is the most common form of dementia in people over the age of 65. Not all dementias are Alzheimer's disease, but all Alzheimer's disease is dementia. When dementia is due to an underlying condition affecting the brain, such as Alzheimer's disease, there is progression over time, which  typically proceeds gradually over many  years.   In your case, I am not entirely certain what is causing your dementia. My sense is that Alzheimer's disease is likely the main problem. That may not be the only thing going on, however, because Alzheimer's does not explain your Parkinsonism, and this may suggest another copathology, namely a Lewy Body Spectrum condition.You could get further workup if diagnostic clarity is a major goal of care for you, but my sense is that this is neurodegenerative and that Alzheimer's is the primary factor and thus, there will likely be worsening over time.   The mainstay of treatment in dementia care involves adaptation and supportive treatments. If you are forgetting things, write them down. Start placing all important belongings in single place near where you use them. For instance, keys can be placed on a table by the door, where you are likely to see them when leaving the house. You may wish to keep a "memory notebook," which is a book you carry that you can write things down in when you need to remember them. I would also recommended that you continue to get help with complex instrumental activities of daily living, such as managing finnaces and attending medical appointments, which your wife also frequently does.   There is now good quality evidence from at least one large scale study that a modified mediterranean diet may help slow cognitive decline. This is known as the "MIND" diet. The Mind diet is not so much a specific diet as it is a set of recommendations for things that you should and should not eat.   Foods that are ENCOURAGED on the MIND Diet:  Green, leafy vegetables: Aim for six or more servings per week. This includes kale, spinach, cooked greens and salads.  All other vegetables: Try to eat another vegetable in addition to the green leafy vegetables at least once a day. It is best to choose non-starchy vegetables because they have a lot of nutrients with a low number of calories.  Berries:  Eat berries at least twice a week. There is a plethora of research on strawberries, and other berries such as blueberries, raspberries and blackberries have also been found to have antioxidant and brain health benefits.  Nuts: Try to get five servings of nuts or more each week. The creators of the [place] don't specify what kind of nuts to consume, but it is probably best to vary the type of nuts you eat to obtain a variety of nutrients. Peanuts are a legume and do not fall into this category.  Olive oil: Use olive oil as your main cooking oil. There may be other heart-healthy alternatives such as algae oil, though there is not yet sufficient research upon which to base a formal recommendation.  Whole grains: Aim for at least three servings daily. Choose minimally processed grains like oatmeal, quinoa, brown rice, whole-wheat pasta and 100% whole-wheat bread.  Fish: Eat fish at least once a week. It is best to choose fatty fish like salmon, sardines, trout, tuna and mackerel for their high amounts of omega-3 fatty acids.  Beans: Include beans in at least four meals every week. This includes all beans, lentils and soybeans.  Poultry: Try to eat chicken or [place] at least twice a week. Note that fried chicken is not encouraged on the MIND diet.  Wine: Aim for no more than one glass of alcohol daily. Both red and white wine may benefit the brain. However, much research has focused  on the red wine compound resveratrol, which may help protect against Alzheimer's disease.  Foods that are DISCOURAGED on the MIND Diet: Butter and margarine: Try to eat less than 1 tablespoon (about 14 grams) daily. Instead, try using olive oil as your primary cooking fat, and dipping your bread in olive oil with herbs.  Cheese: The MIND diet recommends limiting your cheese consumption to less than once per week.  Red meat: Aim for no more than three servings each week. This includes all beef, pork, lamb and products made from  these meats.  [place] food: The MIND diet highly discourages fried food, especially the kind from fast-food restaurants. Limit your consumption to less than once per week.  Pastries and sweets: This includes most of the processed junk food and desserts you can think of. Ice cream, cookies, brownies, snack cakes, donuts, candy and more. Try to limit these to no more than four times a week.  Staying active mentally is crucially important. This can include formal types of brain training, such as cognitive rehabilitation, sudoku, crossword puzzles and the like, although the best thing is to stay active and engaged in your life. This includes having meaningful hobbies and interests that you pursue and especially cultivating satisfying social relationships, which are a form of cognitive stimulation and have been shown to be protective in and of themselves.    There is a significant research base and evidence of effectiveness for something called "behavioral activation," which is a fancy way of saying that you should increase your activity level. In general, people do not feel as happy or do as well when they are not doing much. This can include things like getting out for walks, re-engaging in hobbies, spending time with family or friends, or learning a new hobby. It's not so important what you do as that you enjoy it and stick with it. Depression can start a vicious cycle where you are not doing a lot because you don't feel well, which leads to less things to be excited and happy about, and thus more depression and behavioral avoidance. It can be hard to change this pattern once it has started but most people find that they feel better when they start doing more even if they don't enjoy it at first.    Exercise is one of the best medicines for promoting health and maintaining cognitive fitness at all stages in life. Exercise probably has the largest documented effect on brain health and performance of any  lifestyle intervention. Studies have shown that even previously sedentary individuals who start exercising as late as age 40 show a significant survival benefit as compared to their non-exercising peers. In the [place], the current guidelines are for 30 minutes of moderate exercise per day, but increasing your activity level less than that may also be helpful. You do not have to get your 30 minutes of exercise in one shot and exercising for short periods of time spread throughout the day can be helpful. Go for several walks, learn to dance, or do something else you enjoy that gets your body moving. Of course, if you have an underlying medical condition or there is any question about whether it is safe for you to exercise, you should consult a medical treatment provider prior to beginning exercise.   Test Findings  Test scores are summarized in additional documentation associated with this encounter. Test scores were reasonable on one standalone measure but fell below expectations on another. As per technician, the patient  appeard to "zone" out during the performance of this test. Test scores are relative to age, gender, and educational history as available and appropriate. There were minor concerns about performance validity, but my sense is that this more than likely reflects the extent of his cognitive difficulties and thus, scores are interpreted with appropriate caution. .   General Intellectual Functioning/Achievement:  Performance on singe word reading was toward the low end of the average range, which was used as a baseline for his cognitive test performance.   Attention and Processing Efficiency: Performance on indicators of attention was variable with a relative weakness in working memory yet no frank impairment. Digit repetition forward was average whereas digit repetition backward was lower, suggesting a relative weakness in working memory. Performance on timed indicators of processing  speed was low with extremely low scores on simple numeric sequencing and timed number-symbol coding.   Language: Performance on language measures showed diminished verbal fluency with unusually low scores in both the category and phonemic modalities. Visual object confrontation naming was better and fell at a low average level.   Visuospatial Function: Performance within this domain was mildly reduced, albeit not to the degree expected in a classical presentation of Lewy Body Dementia. Copy of a line drawing was a bit sloppy but generated an average score. Judgment of angular line orientation was a bit lower, in the unusually low range.   Learning and Memory: Performance on measures of learning and memory was significantly impaired. The possibility that his memory difficulties simply reflect very deficient encoding cannot be entirely excluded but a storage problem is suspected.   In the verbal realm, immediate recall of material including a 12-item word list and brief short story was extremely low with very little information learned, generating extremely low scores. Following a standard delay, he did not recall any information from either the word list or the short story. Recognition cuing did not benefit his performance with more false positives than correct identifications. Memory for brief daily living type information was not much better, with marginally better unusually low encoding yet still no information retained and extremely low delayed recall. Recognition for the information was unusually low.   In the visual realm, he did not recall any details of a modestly complex geometric figure that he was asked to copy after about 10 minutes.   Executive Functions: Performance on executive measures was fairly significantly impaired with low scores on almost all measures. Generation of words in response to the letters F-A-S was unusually low. Alternating sequencing of numbers and letters of the  alphabet was extremely low. Performance was unusually low on the Modified [employer], mainly because of a limited number of solution categories. He did not respond in an overly perseverative fashion and achieved an average score for perseverative errors. Reasoning with verbal information was unusually low on the complex ideational material. Clock drawing was better with only some minor distortion of the clock hands.   Rating Scale(s): Mr. Paino screened at the margin of positive for depressive symptoms, but mainly due to endorsing items such as "I prefer to stay at home rather than going out and doing new things." He was characterized as functioning at an MCI to possibly Mild Dementia level by his wife. I was able to rate a CDR for him and his Sum of Boxes score is at least a 4 if not higher, which is very mild dementia.   Bettye Boeck Roseanne Reno PsyD, ABN Clinical Neuropsychologist

## 2020-02-27 NOTE — Progress Notes (Signed)
[facility]  Feedback Note: I met with Joe Murray to review the findings resulting from his neuropsychological evaluation. Since the last appointment, he has been about the same. He is back on the carbidopa-levodopa. Time was spent reviewing the impressions and recommendations that are detailed in the evaluation report. We discussed importance of staying mobile, he was previously given information on exercises classes by Dr. Delice Lesch. His wife said that most of them are simply things he "won't do," and we brainstormed about how to use techniques, motivational enhancement, and the like to elicit his compliance. I explained that reasoning and dialogue typically do not work well. He is already on Namenda. I took time to explain the findings and answer all the patient's questions. I encouraged Joe Murray to contact me should he have any further questions or if further follow up is desired.   Current Medications and Medical History   Current Outpatient Medications  Medication Sig Dispense Refill  . aspirin EC 81 MG tablet Take 81 mg by mouth daily.    . carbidopa-levodopa (SINEMET IR) 25-100 MG tablet Take 1/2 tablet three times a day with meals for a week, then increase to 1 tablet three times a day with meals 90 tablet 5  . Cholecalciferol (VITAMIN D3) 5000 UNITS CAPS Take 5,000 Units by mouth daily. 30 capsule 0  . Cinnamon 500 MG capsule Take 2,000 mg by mouth daily.     . fish oil-omega-3 fatty acids 1000 MG capsule Take 2 g by mouth daily.      Marland Kitchen glipiZIDE (GLUCOTROL) 5 MG tablet TAKE 1 TABLET BY MOUTH TWICE DAILY BEFORE A MEAL 180 tablet 3  . Lancets (BD LANCET ULTRAFINE 30G) MISC USE ONE  TO CHECK GLUCOSE ONCE DAILY 100 each 1  . MAGNESIUM CHLORIDE PO Take 1 tablet by mouth daily.      . memantine (NAMENDA) 10 MG tablet Take 1 tablet every night for 2 weeks, then increase to 1 tablet twice a day 60 tablet 11  . nitroGLYCERIN (NITROSTAT) 0.4 MG SL  tablet PLACE 1 TABLET UNDER THE TONGUE EVERY 5 MINUTES FOR 3 DOSES AS NEEDED FOR CHEST PAIN 25 tablet 6  . ONE TOUCH ULTRA TEST test strip USE ONE STRIP TO CHECK GLUCOSE ONCE DAILY 50 each 0  . potassium gluconate 595 MG TABS tablet Take 595 mg by mouth.    Marland Kitchen [place] 420 MG/3.5ML SOCT INJECT 420 MG INTO THE SKIN EVERY 30 DAYS 4 mL 11   No current facility-administered medications for this visit.    Patient Active Problem List   Diagnosis Date Noted  . Intermittent claudication ([place]) 12/01/2017  . Trochanteric bursitis of right hip 09/04/2017  . Fuchs' corneal dystrophy 08/14/2016  . Essential hypertension -> allowing permissive hypertension.  Medication stopped because of fatigue and orthostatic dizziness. 03/05/2015  . NSTEMI (non-ST elevated myocardial infarction) ([place])   . Coronary artery disease involving native coronary artery of native heart without angina pectoris   . Vitamin D deficiency 07/04/2014  . Fatigue 01/08/2014  . Obesity (BMI 30-39.9) 01/12/2013  . DM (diabetes mellitus), type 2 with complications - CAD   . Dyslipidemia, goal LDL below 70   . Exertional shortness of breath 01/12/2012  . Chronotropic incompetence 12/25/2011  . Vertigo with mild postural lightheadedness. 08/13/2009  . ERECTILE DYSFUNCTION, ORGANIC 05/01/2009  . GERD 04/18/2008  . BPH with urinary obstruction 07/27/2007  . CAD S/P percutaneous coronary angioplasty - LADx2, & RCA x  3 07/12/2001    Class: History of    Mental Status and Behavioral Observations  Joe Murray presented on time to the present encounter and was alert and generally oriented. Speech was normal in rate, rhythm, volume, and prosody. Self-reported mood was "fine" and affect was flat. Thought process was circumstantial, he didn't seem to track the topics we discussed well, and thought content was minimal. There were no safety concerns identified at today's encounter, such as thoughts of harming self or others.    Plan  Feedback provided regarding the patient's neuropsychological evaluation. He has no activities and is not interested in starting any, I tried to problem solve with his wife about how this could be achieved. We also processed how caregiving results in role-transitions in a relationship dyad, which can be challenging. She seems motivated to help him do his best. Joe Murray was encouraged to contact me if any questions arise or if further follow up is desired.   Viviano Simas Nicole Kindred, PsyD, ABN Clinical Neuropsychologist  Service(s) Provided at This Encounter: 45 minutes [phone]; Conjoint therapy with patient present)

## 2020-02-27 NOTE — Patient Instructions (Signed)
Your performance and presentation on assessment were consistent with impairment in several different areas, including on measures of processing speed, memory, and executive function. There were more minor issues with visuospatial/constructional functioning, which involves processing visual information. Given that you are starting to have some difficulties with day-to-day activities, I think that dementia is the best term for the level of problem you are demonstrating.   Dementia refers to a group of syndromes where multiple areas of ability are damaged in the brain, such as memory, thinking, judgment, and behavior, and most commonly refers to age related causes of dementia that cause worsening in these abilities over time. Alzheimer's disease is the most common form of dementia in people over the age of 22. Not all dementias are Alzheimer's disease, but all Alzheimer's disease is dementia. When dementia is due to an underlying condition affecting the brain, such as Alzheimer's disease, there is progression over time, which typically proceeds gradually over many years.   In your case, I am not entirely certain what is causing your dementia. My sense is that Alzheimer's disease is likely the main problem. That may not be the only thing going on, however, because Alzheimer's does not explain your Parkinsonism, and this may suggest another copathology, namely a Lewy Body Spectrum condition.You could get further workup if diagnostic clarity is a major goal of care for you, but my sense is that this is neurodegenerative and that Alzheimer's is the primary factor and thus, there will likely be worsening over time.   The mainstay of treatment in dementia care involves adaptation and supportive treatments. If you are forgetting things, write them down. Start placing all important belongings in single place near where you use them. For instance, keys can be placed on a table by the door, where you are likely to see them  when leaving the house. You may wish to keep a "memory notebook," which is a book you carry that you can write things down in when you need to remember them. I would also recommended that you continue to get help with complex instrumental activities of daily living, such as managing finnaces and attending medical appointments, which your wife also frequently does.   There is now good quality evidence from at least one large scale study that a modified mediterranean diet may help slow cognitive decline. This is known as the "MIND" diet. The Mind diet is not so much a specific diet as it is a set of recommendations for things that you should and should not eat.   Foods that are ENCOURAGED on the MIND Diet:  Green, leafy vegetables: Aim for six or more servings per week. This includes kale, spinach, cooked greens and salads.  All other vegetables: Try to eat another vegetable in addition to the green leafy vegetables at least once a day. It is best to choose non-starchy vegetables because they have a lot of nutrients with a low number of calories.  Berries: Eat berries at least twice a week. There is a plethora of research on strawberries, and other berries such as blueberries, raspberries and blackberries have also been found to have antioxidant and brain health benefits.  Nuts: Try to get five servings of nuts or more each week. The creators of the MIND diet don't specify what kind of nuts to consume, but it is probably best to vary the type of nuts you eat to obtain a variety of nutrients. Peanuts are a legume and do not fall into this category.  Olive  oil: Use olive oil as your main cooking oil. There may be other heart-healthy alternatives such as algae oil, though there is not yet sufficient research upon which to base a formal recommendation.  Whole grains: Aim for at least three servings daily. Choose minimally processed grains like oatmeal, quinoa, brown rice, whole-wheat pasta and 100% whole-wheat  bread.  Fish: Eat fish at least once a week. It is best to choose fatty fish like salmon, sardines, trout, tuna and mackerel for their high amounts of omega-3 fatty acids.  Beans: Include beans in at least four meals every week. This includes all beans, lentils and soybeans.  Poultry: Try to eat chicken or [place] at least twice a week. Note that fried chicken is not encouraged on the MIND diet.  Wine: Aim for no more than one glass of alcohol daily. Both red and white wine may benefit the brain. However, much research has focused on the red wine compound resveratrol, which may help protect against Alzheimer's disease.  Foods that are DISCOURAGED on the MIND Diet: Butter and margarine: Try to eat less than 1 tablespoon (about 14 grams) daily. Instead, try using olive oil as your primary cooking fat, and dipping your bread in olive oil with herbs.  Cheese: The MIND diet recommends limiting your cheese consumption to less than once per week.  Red meat: Aim for no more than three servings each week. This includes all beef, pork, lamb and products made from these meats.  [place] food: The MIND diet highly discourages fried food, especially the kind from fast-food restaurants. Limit your consumption to less than once per week.  Pastries and sweets: This includes most of the processed junk food and desserts you can think of. Ice cream, cookies, brownies, snack cakes, donuts, candy and more. Try to limit these to no more than four times a week.  Staying active mentally is crucially important. This can include formal types of brain training, such as cognitive rehabilitation, sudoku, crossword puzzles and the like, although the best thing is to stay active and engaged in your life. This includes having meaningful hobbies and interests that you pursue and especially cultivating satisfying social relationships, which are a form of cognitive stimulation and have been shown to be protective in and of themselves.     There is a significant research base and evidence of effectiveness for something called "behavioral activation," which is a fancy way of saying that you should increase your activity level. In general, people do not feel as happy or do as well when they are not doing much. This can include things like getting out for walks, re-engaging in hobbies, spending time with family or friends, or learning a new hobby. It's not so important what you do as that you enjoy it and stick with it. Depression can start a vicious cycle where you are not doing a lot because you don't feel well, which leads to less things to be excited and happy about, and thus more depression and behavioral avoidance. It can be hard to change this pattern once it has started but most people find that they feel better when they start doing more even if they don't enjoy it at first.    Exercise is one of the best medicines for promoting health and maintaining cognitive fitness at all stages in life. Exercise probably has the largest documented effect on brain health and performance of any lifestyle intervention. Studies have shown that even previously sedentary individuals who start exercising as late  as age 52 show a significant survival benefit as compared to their non-exercising peers. In the [place], the current guidelines are for 30 minutes of moderate exercise per day, but increasing your activity level less than that may also be helpful. You do not have to get your 30 minutes of exercise in one shot and exercising for short periods of time spread throughout the day can be helpful. Go for several walks, learn to dance, or do something else you enjoy that gets your body moving. Of course, if you have an underlying medical condition or there is any question about whether it is safe for you to exercise, you should consult a medical treatment provider prior to beginning exercise.

## 2020-03-04 ENCOUNTER — Other Ambulatory Visit: Payer: Self-pay | Admitting: Family Medicine

## 2020-03-26 ENCOUNTER — Ambulatory Visit (INDEPENDENT_AMBULATORY_CARE_PROVIDER_SITE_OTHER): Payer: Medicare HMO

## 2020-03-26 DIAGNOSIS — Z0000 Encounter for general adult medical examination without abnormal findings: Secondary | ICD-10-CM

## 2020-03-26 NOTE — Progress Notes (Signed)
Subjective:   Joe Murray is a 80 y.o. male who presents for an Initial Medicare Annual Wellness Visit.  I connected with Joe Murray today by telephone and verified that I am speaking with the correct person using two identifiers. Location patient: home Location provider: work Persons participating in the virtual visit: patient, provider.   I discussed the limitations, risks, security and privacy concerns of performing an evaluation and management service by telephone and the availability of in person appointments. I also discussed with the patient that there may be a patient responsible charge related to this service. The patient expressed understanding and verbally consented to this telephonic visit.    Interactive audio and video telecommunications were attempted between this provider and patient, however failed, due to patient having technical difficulties OR patient did not have access to video capability.  We continued and completed visit with audio only.      Review of Systems    N/A  Cardiac Risk Factors include: diabetes mellitus;advanced age (>10men, >53 women);male gender;dyslipidemia     Objective:    Today's Vitals   There is no height or weight on file to calculate BMI.  Advanced Directives 03/26/2020 12/28/2019 11/18/2019 06/19/2019 11/14/2018 02/25/2015  Does Patient Have a Medical Advance Directive? Yes Yes No Yes Yes No  Type of Paramedic of [place];Living will [place];Living will;Out of facility DNR (pink MOST or yellow form) - [place];Living will Living will;Healthcare Power of Attorney -  Does patient want to make changes to medical advance directive? No - Patient declined - - - - -  Copy of [place] in Chart? No - copy requested - - - - -  Would patient like information on creating a medical advance directive? - - No - Patient declined - - -    Current Medications  (verified) Outpatient Encounter Medications as of 03/26/2020  Medication Sig  . aspirin EC 81 MG tablet Take 81 mg by mouth daily.  . carbidopa-levodopa (SINEMET IR) 25-100 MG tablet Take 1/2 tablet three times a day with meals for a week, then increase to 1 tablet three times a day with meals  . Cholecalciferol (VITAMIN D3) 5000 UNITS CAPS Take 5,000 Units by mouth daily.  . Cinnamon 500 MG capsule Take 2,000 mg by mouth daily.  . fish oil-omega-3 fatty acids 1000 MG capsule Take 2 g by mouth daily.  Marland Kitchen glipiZIDE (GLUCOTROL) 5 MG tablet TAKE 1 TABLET BY MOUTH TWICE DAILY BEFORE A MEAL  . Lancets (BD LANCET ULTRAFINE 30G) MISC USE ONE  TO CHECK GLUCOSE ONCE DAILY  . MAGNESIUM CHLORIDE PO Take 1 tablet by mouth daily.  . memantine (NAMENDA) 10 MG tablet Take 1 tablet every night for 2 weeks, then increase to 1 tablet twice a day  . ONETOUCH ULTRA test strip USE TO TEST DAILY  . potassium gluconate 595 MG TABS tablet Take 595 mg by mouth.  Marland Kitchen [place] 420 MG/3.5ML SOCT INJECT 420 MG INTO THE SKIN EVERY 30 DAYS  . nitroGLYCERIN (NITROSTAT) 0.4 MG SL tablet PLACE 1 TABLET UNDER THE TONGUE EVERY 5 MINUTES FOR 3 DOSES AS NEEDED FOR CHEST PAIN (Patient not taking: Reported on 03/26/2020)   No facility-administered encounter medications on file as of 03/26/2020.    Allergies (verified) Beta adrenergic blockers, Praluent [alirocumab], Sulfonamide derivatives, and Statins   History: Past Medical History:  Diagnosis Date  . CAD S/P percutaneous coronary angioplasty 10/'03; 3/'09; 7/*09;  1/'17    a. Dr. Ellyn Hack; '03 - Cypher DES 3.0 mm 33 mm proximal-mid LAD (details D1 with ostial 60%); 3/'09 dRCA 2.75 mm x 13 mm Cypher DES (2.8 mm); 7/'09 pRCA 3.0 mm x 18 m Cypher DES (3.25 mm);; (2009) 2D Echo - EF 55%, (November 2014) nonischemic Myoview;; b. Promus DES to RCA and mid LAD 02/26/2015  . Chronotropic incompetence 12/2011   ETT 11/2017: Normal blood pressure response.  No EKG changes.   Exercise stopped due to fatigue and dyspnea.  Heart rate increased to low 90s from 50s.  Suggest chronotropic incompetence.  Severely impaired exercise capacity. -->  Results reviewed with electrophysiology: Still not indication for pacemaker.  . Diabetes mellitus type 2 in obese (HCC)    CAD  . Diverticulosis   . Dyslipidemia, goal LDL below 70    on [employer] study medicaion.  . ED (erectile dysfunction)   . GERD (gastroesophageal reflux disease)   . Hemorrhoids 07/2002   Internal and External  . Insomnia   . Nodular basal cell carcinoma (BCC) 05/08/2019   left ear (cx37fu)  . Non-STEMI (non-ST elevated myocardial infarction) ([place]) 11/2001   a. Proximal LAD tandem lesions -- long DES stent covering both;; b. Jan 2017: mRCA PCI, [place] PCI  . Persistent sinus bradycardia   . Personal history of colonic adenomas 09/06/2012  . Psoriasis    Resting HR in 54s  . SCC (squamous cell carcinoma) 11/13/2001   right upper outer arm (txpbx)  . SCC (squamous cell carcinoma) 11/13/2009   right upper ear rim (cx3 25fu)  . SCC (squamous cell carcinoma) 01/20/2010   right nose (cx39fu)  . SCC (squamous cell carcinoma) 03/28/2014   right side of nose ( cx18fu)  . SCC (squamous cell carcinoma) 06/03/2016   right side of nose  . SCC (squamous cell carcinoma) 05/08/2019   right ear inferior (cx108fu) cis  . Spasm of esophagus   . Squamous cell carcinoma in situ (SCCIS) 05/08/2019   Right Ear Inferior  . Squamous cell carcinoma of skin 11/08/2001   top right rim ear   . SVT (supraventricular tachycardia) ([place]) 2003  . Vertigo, benign positional    Past Surgical History:  Procedure Laterality Date  . CARDIAC CATHETERIZATION N/A 02/26/2015   Procedure: Left Heart Cath and Coronary Angiography;  Surgeon: Leonie Man, MD: mRCA 99% --> PCI. mLAD 75%-> FFR Guided PCI, Mod ISR in pLAD & pRCA  . CARDIAC CATHETERIZATION N/A 02/26/2015   Procedure: Coronary Stent Intervention;  Surgeon: Leonie Man,  MD;  Location: [facility]: mRCA Promus Premier DES 3.0 x 12 (3.5), mLAD Promus Premier DES 2.75 x 20 (3.0)  . CARDIAC CATHETERIZATION N/A 02/26/2015   Procedure: Intravascular Pressure Wire/FFR Study;  Surgeon: Leonie Man, MD;  Location: [facility];  Service: Cardiovascular: LAD 75% - FFR + -> PCI  . CATARACT EXTRACTION  [id number]  . COLONOSCOPY  12/16/2017   per Dr. Carlean Purl, adenomatous polyps, no repeats due to age   . CORONARY ANGIOPLASTY WITH STENT PLACEMENT  LAD - 2003, RCA 3 & 7/ '09   Cypher 3.0 x 32 mid LAD; Cyper 2.75 x 13 - distal RCA, 3.0 x 18 Prox RCA  . ESOPHAGOGASTRODUODENOSCOPY  08-11-02   esophageal dilation per Dr. Carlean Purl  . HERNIA REPAIR     right inguinal   . NM MYOVIEW LTD  Nov 2014   ~8 METS; EF 60%, no ischemia or infarction  . PERCUTANEOUS CORONARY STENT INTERVENTION (  PCI-S)  11/23/2001   NSTEMI: Prox-Mid LAD tandem ~80% lesions on either side of D1 (with 80% lesion) -- Cypher DES 3.0 mm x 33 m (covering both lesions)   . PERCUTANEOUS CORONARY STENT INTERVENTION (PCI-S)  04/27/2007   Unstable Angina: Distal RCA 95%: Cypher 2.75x13  (2.8 mm); residual focal ~60%ISR in LAD stent, 60% D1 ostial (no PTCA on LAD due to no ischemia on ST)  . PERCUTANEOUS CORONARY STENT INTERVENTION (PCI-S)  09/21/2007   Bradycardia & Unstable Angina: Prox RCA 70% - PCI Cypher DES 3.0 mm x 18 mm  (3.25 mm); ostial 60-70% jailed D1. LAD & distal RCA stents patent  . SHOULDER SURGERY     left rotator cuff, Dr. Gladstone Lighter  . TRANSTHORACIC ECHOCARDIOGRAM  March 2009   Normal LV size and function, EF 55%. Mild MR; mild RV dilation.  . TRANSTHORACIC ECHOCARDIOGRAM  02/2015   EF 60-65%. Moderate concentric LVH. Normal function with normal regional wall motion. GR 1 DD  . WRIST FRACTURE SURGERY     right   Family History  Problem Relation Age of Onset  . Diabetes Mother   . Hearing loss Father   . Dementia Father   . Heart attack Father 69       first MI prior to age 88  .  Heart attack Brother    Social History   Socioeconomic History  . Marital status: Married    Spouse name: Not on file  . Number of children: 4  . Years of education: Not on file  . Highest education level: Not on file  Occupational History  . Occupation: retired  Tobacco Use  . Smoking status: Current Every Day Smoker    Types: Cigars    Start date: 07/06/1952  . Smokeless tobacco: Never Used  . Tobacco comment: 1-2 per day; he says "I really don't inhale"  Vaping Use  . Vaping Use: Never used  Substance and Sexual Activity  . Alcohol use: Yes    Alcohol/week: 0.0 standard drinks    Comment: occ  . Drug use: No  . Sexual activity: Never  Other Topics Concern  . Not on file  Social History Narrative   He is a married, father of 86, grandfather 3.   He still smokes 2-3 cigars per day. States that he "does not really inhale ". He is not really all that it's including, stating that he wants to have his 1 remaining vice.   Exercises only on occasion.   He does various landscaping jobs including cutting wood, and clearing brush.      Pt is right handed   Lives in 2 story home with his wife   High school graduate   Retired Dealer   Social Determinants of Health   Financial Resource Strain: [place]   . Difficulty of Paying Living Expenses: Not hard at all  Food Insecurity: No Food Insecurity  . Worried About Charity fundraiser in the Last Year: Never true  . Ran Out of Food in the Last Year: Never true  Transportation Needs: No Transportation Needs  . Lack of Transportation (Medical): No  . Lack of Transportation (Non-Medical): No  Physical Activity: Inactive  . Days of Exercise per Week: 0 days  . Minutes of Exercise per Session: 0 min  Stress: No Stress Concern Present  . Feeling of Stress : Not at all  Social Connections: Moderately Integrated  . Frequency of Communication with Friends and Family: Once a week  .  Frequency of Social Gatherings with Friends and  Family: Twice a week  . Attends Religious Services: 1 to 4 times per year  . Active Member of Clubs or Organizations: No  . Attends Archivist Meetings: Never  . Marital Status: Married    Tobacco Counseling Ready to quit: Not Answered Counseling given: Not Answered Comment: 1-2 per day; he says "I really don't inhale"   Clinical Intake:  Pre-visit preparation completed: Yes  Pain : No/denies pain     Nutritional Risks: None Diabetes: Yes CBG done?: No Did pt. bring in CBG monitor from home?: No  How often do you need to have someone help you when you read instructions, pamphlets, or other written materials from your doctor or pharmacy?: 5 - Always What is the last grade level you completed in school?: 12th grade  Diabetic?Yes Nutrition Risk Assessment:  Has the patient had any N/V/D within the last 2 months?  No  Does the patient have any non-healing wounds?  No  Has the patient had any unintentional weight loss or weight gain?  No   Diabetes:  Is the patient diabetic?  Yes  If diabetic, was a CBG obtained today?  No  Did the patient bring in their glucometer from home?  No  How often do you monitor your CBG's? Patient states checks glucose once or twice week .   Financial Strains and Diabetes Management:  Are you having any financial strains with the device, your supplies or your medication? No .  Does the patient want to be seen by Chronic Care Management for management of their diabetes?  No  Would the patient like to be referred to a Nutritionist or for Diabetic Management?  No   Diabetic Exams:  Diabetic Eye Exam: Overdue for diabetic eye exam. Pt has been advised about the importance in completing this exam. Patient advised to call and schedule an eye exam. Diabetic Foot Exam: Overdue, Pt has been advised about the importance in completing this exam. Pt is scheduled for diabetic foot exam on 10/2020.   Interpreter Needed?: No  Information  entered by :: [place] of Daily Living In your present state of health, do you have any difficulty performing the following activities: 03/26/2020  Hearing? Y  Vision? Y  Difficulty concentrating or making decisions? Y  Walking or climbing stairs? Y  Dressing or bathing? N  Doing errands, shopping? Y  Preparing Food and eating ? N  Using the Toilet? N  In the past six months, have you accidently leaked urine? N  Do you have problems with loss of bowel control? N  Managing your Medications? N  Managing your Finances? N  Housekeeping or managing your Housekeeping? N  Some recent data might be hidden    Patient Care Team: Laurey Morale, MD as PCP - General Leonie Man, MD as PCP - Cardiology (Cardiology) Cameron Sprang, MD as Consulting Physician (Neurology) Lavonna Monarch, MD as Consulting Physician (Dermatology)  Indicate any recent Medical Services you may have received from other than [facility] providers in the past year (date may be approximate).     Assessment:   This is a routine wellness examination for [place].  Hearing/Vision screen  Hearing Screening   125Hz  250Hz  500Hz  1000Hz  2000Hz  3000Hz  4000Hz  6000Hz  8000Hz   Right ear:           Left ear:           Vision Screening Comments: Patient states has not  eye exam in about a year   Dietary issues and exercise activities discussed: Current Exercise Habits: The patient does not participate in regular exercise at present  Goals    . Exercise 3x per week (30 min per time)      Depression Screen PHQ 2/9 Scores 03/26/2020 08/27/2017 08/16/2015 07/04/2014  PHQ - 2 Score 0 0 0 0    Fall Risk Fall Risk  03/26/2020 12/28/2019 06/19/2019 11/14/2018 04/06/2018  Falls in the past year? 1 1 1  0 0  Number falls in past yr: 1 1 1  0 0  Injury with Fall? 1 1 1  0 0  Risk for fall due to : History of fall(s);Impaired balance/gait - Impaired balance/gait;Impaired mobility;History of fall(s) - -  Follow up Falls prevention  discussed;Falls evaluation completed - - - -    FALL RISK PREVENTION PERTAINING TO THE HOME:  Any stairs in or around the home? Yes  If so, are there any without handrails? No  Home free of loose throw rugs in walkways, pet beds, electrical cords, etc? Yes  Adequate lighting in your home to reduce risk of falls? Yes   ASSISTIVE DEVICES UTILIZED TO PREVENT FALLS:  Life alert? No  Use of a cane, walker or w/c? No  Grab bars in the bathroom? Yes  Shower chair or bench in shower? Yes  Elevated toilet seat or a handicapped toilet? No    Cognitive Function: MMSE - Mini Mental State Exam 02/21/2020 12/28/2019  Orientation to time 3 4  Orientation to Place 5 5  Registration 3 3  Attention/ Calculation 3 5  Recall 1 0  Language- name 2 objects 2 2  Language- repeat 1 1  Language- follow 3 step command 3 3  Language- read & follow direction 1 1  Write a sentence 1 1  Copy design 1 1  Total score 24 26   Montreal Cognitive Assessment  11/11/2018 04/06/2018  Visuospatial/ Executive (0/5) - 4  Naming (0/3) - 3  Attention: Read list of digits (0/2) 2 2  Attention: Read list of letters (0/1) 1 1  Attention: Serial 7 subtraction starting at 100 (0/3) 3 3  Language: Repeat phrase (0/2) 1 1  Language : Fluency (0/1) 0 0  Abstraction (0/2) 1 2  Delayed Recall (0/5) 0 2  Orientation (0/6) 5 6  Total - 24  Adjusted Score (based on education) - 25   6CIT Screen 03/26/2020  What Year? 0 points  What month? 0 points  What time? 0 points  Count back from 20 0 points  Months in reverse 0 points  Repeat phrase 4 points  Total Score 4    Immunizations Immunization History  Administered Date(s) Administered  . Fluad Quad(high Dose 65+) 11/04/2018, 11/07/2019  . Influenza Split 11/27/2010  . Influenza, High Dose Seasonal PF 12/18/2013, 01/02/2015, 12/26/2016, 12/19/2017  . Influenza-Unspecified 12/24/2012, 12/18/2013, 12/25/2014  . PFIZER(Purple Top)SARS-COV-2 Vaccination 03/16/2019,  04/06/2019, 10/14/2019  . Pneumococcal Conjugate-13 08/16/2015  . Pneumococcal Polysaccharide-23 05/01/2009  . Zoster Recombinat (Shingrix) 12/19/2017, 02/18/2018    TDAP status: Due, Education has been provided regarding the importance of this vaccine. Advised may receive this vaccine at local pharmacy or Health Dept. Aware to provide a copy of the vaccination record if obtained from local pharmacy or Health Dept. Verbalized acceptance and understanding.  Flu Vaccine status: Up to date  Pneumococcal vaccine status: Up to date  Covid-19 vaccine status: Completed vaccines  Qualifies for Shingles Vaccine? Yes   Zostavax completed No  Shingrix Completed?: Yes  Screening Tests Health Maintenance  Topic Date Due  . FOOT EXAM  Never done  . TETANUS/TDAP  Never done  . URINE MICROALBUMIN  08/08/2016  . OPHTHALMOLOGY EXAM  03/26/2018  . COVID-19 Vaccine (4 - Booster for Pfizer series) 04/15/2020  . HEMOGLOBIN A1C  05/06/2020  . INFLUENZA VACCINE  Completed  . PNA vac Low Risk Adult  Completed    Health Maintenance  Health Maintenance Due  Topic Date Due  . FOOT EXAM  Never done  . TETANUS/TDAP  Never done  . URINE MICROALBUMIN  08/08/2016  . OPHTHALMOLOGY EXAM  03/26/2018    Colorectal cancer screening: No longer required.   Lung Cancer Screening: (Low Dose CT Chest recommended if Age 17-80 years, 30 pack-year currently smoking OR have quit w/in 15years.) does not qualify.   Lung Cancer Screening Referral: N/A   Additional Screening:  Hepatitis C Screening: does not qualify;   Vision Screening: Recommended annual ophthalmology exams for early detection of glaucoma and other disorders of the eye. Is the patient up to date with their annual eye exam?  No  Who is the provider or what is the name of the office in which the patient attends annual eye exams? Dr.Gininggak If pt is not established with a provider, would they like to be referred to a provider to establish care?  No .   Dental Screening: Recommended annual dental exams for proper oral hygiene  Community Resource Referral / Chronic Care Management: CRR required this visit?  No   CCM required this visit?  No      Plan:     I have personally reviewed and noted the following in the patient's chart:   . Medical and social history . Use of alcohol, tobacco or illicit drugs  . Current medications and supplements . Functional ability and status . Nutritional status . Physical activity . Advanced directives . List of other physicians . Hospitalizations, surgeries, and ER visits in previous 12 months . Vitals . Screenings to include cognitive, depression, and falls . Referrals and appointments  In addition, I have reviewed and discussed with patient certain preventive protocols, quality metrics, and best practice recommendations. A written personalized care plan for preventive services as well as general preventive health recommendations were provided to patient.     Ofilia Neas, LPN   06/02/4494   Nurse Notes: None

## 2020-03-26 NOTE — Patient Instructions (Signed)
[name] , Thank you for taking time to come for your Medicare Wellness Visit. I appreciate your ongoing commitment to your health goals. Please review the following plan we discussed and let me know if I can assist you in the future.   Screening recommendations/referrals: Colonoscopy: No longer required  Recommended yearly ophthalmology/optometry visit for glaucoma screening and checkup Recommended yearly dental visit for hygiene and checkup  Vaccinations: Influenza vaccine: Up to date, next due fall 2022 Pneumococcal vaccine: Completed series  Tdap vaccine: Currently due, if you wish to receive you may contact your insurance company to discuss cost or you may await and injury to receive  Shingles vaccine: Completed series     Advanced directives: Please bring in copies of your advanced medical directives into our office so that we may scan it into your chart.   Conditions/risks identified: None   Next appointment: 03/27/2021 @ 10:30 am with Dr. Laurey Murray  Preventive Care 81 Years and Older, Male Preventive care refers to lifestyle choices and visits with your health care provider that can promote health and wellness. What does preventive care include?  A yearly physical exam. This is also called an annual well check.  Dental exams once or twice a year.  Routine eye exams. Ask your health care provider how often you should have your eyes checked.  Personal lifestyle choices, including:  Daily care of your teeth and gums.  Regular physical activity.  Eating a healthy diet.  Avoiding tobacco and drug use.  Limiting alcohol use.  Practicing safe sex.  Taking low doses of aspirin every day.  Taking vitamin and mineral supplements as recommended by your health care provider. What happens during an annual well check? The services and screenings done by your health care provider during your annual well check will depend on your age, overall health, lifestyle risk  factors, and family history of disease. Counseling  Your health care provider may ask you questions about your:  Alcohol use.  Tobacco use.  Drug use.  Emotional well-being.  Home and relationship well-being.  Sexual activity.  Eating habits.  History of falls.  Memory and ability to understand (cognition).  Work and work Statistician. Screening  You may have the following tests or measurements:  Height, weight, and BMI.  Blood pressure.  Lipid and cholesterol levels. These may be checked every 5 years, or more frequently if you are over 42 years old.  Skin check.  Lung cancer screening. You may have this screening every year starting at age 67 if you have a 30-pack-year history of smoking and currently smoke or have quit within the past 15 years.  Fecal occult blood test (FOBT) of the stool. You may have this test every year starting at age 46.  Flexible sigmoidoscopy or colonoscopy. You may have a sigmoidoscopy every 5 years or a colonoscopy every 10 years starting at age 42.  Prostate cancer screening. Recommendations will vary depending on your family history and other risks.  Hepatitis C blood test.  Hepatitis B blood test.  Sexually transmitted disease (STD) testing.  Diabetes screening. This is done by checking your blood sugar (glucose) after you have not eaten for a while (fasting). You may have this done every 1-3 years.  Abdominal aortic aneurysm (AAA) screening. You may need this if you are a current or former smoker.  Osteoporosis. You may be screened starting at age 60 if you are at high risk. Talk with your health care provider about your test  results, treatment options, and if necessary, the need for more tests. Vaccines  Your health care provider may recommend certain vaccines, such as:  Influenza vaccine. This is recommended every year.  Tetanus, diphtheria, and acellular pertussis (Tdap, Td) vaccine. You may need a Td booster every 10  years.  Zoster vaccine. You may need this after age 81.  Pneumococcal 13-valent conjugate (PCV13) vaccine. One dose is recommended after age 15.  Pneumococcal polysaccharide (PPSV23) vaccine. One dose is recommended after age 81. Talk to your health care provider about which screenings and vaccines you need and how often you need them. This information is not intended to replace advice given to you by your health care provider. Make sure you discuss any questions you have with your health care provider. Document Released: 03/08/2015 Document Revised: 10/30/2015 Document Reviewed: 12/11/2014 Elsevier Interactive Patient Education  2017 [place] Prevention in the Home Falls can cause injuries. They can happen to people of all ages. There are many things you can do to make your home safe and to help prevent falls. What can I do on the outside of my home?  Regularly fix the edges of walkways and driveways and fix any cracks.  Remove anything that might make you trip as you walk through a door, such as a raised step or threshold.  Trim any bushes or trees on the path to your home.  Use bright outdoor lighting.  Clear any walking paths of anything that might make someone trip, such as rocks or tools.  Regularly check to see if handrails are loose or broken. Make sure that both sides of any steps have handrails.  Any raised decks and porches should have guardrails on the edges.  Have any leaves, snow, or ice cleared regularly.  Use sand or salt on walking paths during winter.  Clean up any spills in your garage right away. This includes oil or grease spills. What can I do in the bathroom?  Use night lights.  Install grab bars by the toilet and in the tub and shower. Do not use towel bars as grab bars.  Use non-skid mats or decals in the tub or shower.  If you need to sit down in the shower, use a plastic, non-slip stool.  Keep the floor dry. Clean up any water that  spills on the floor as soon as it happens.  Remove soap buildup in the tub or shower regularly.  Attach bath mats securely with double-sided non-slip rug tape.  Do not have throw rugs and other things on the floor that can make you trip. What can I do in the bedroom?  Use night lights.  Make sure that you have a light by your bed that is easy to reach.  Do not use any sheets or blankets that are too big for your bed. They should not hang down onto the floor.  Have a firm chair that has side arms. You can use this for support while you get dressed.  Do not have throw rugs and other things on the floor that can make you trip. What can I do in the kitchen?  Clean up any spills right away.  Avoid walking on wet floors.  Keep items that you use a lot in easy-to-reach places.  If you need to reach something above you, use a strong step stool that has a grab bar.  Keep electrical cords out of the way.  Do not use floor polish or wax that makes  floors slippery. If you must use wax, use non-skid floor wax.  Do not have throw rugs and other things on the floor that can make you trip. What can I do with my stairs?  Do not leave any items on the stairs.  Make sure that there are handrails on both sides of the stairs and use them. Fix handrails that are broken or loose. Make sure that handrails are as long as the stairways.  Check any carpeting to make sure that it is firmly attached to the stairs. Fix any carpet that is loose or worn.  Avoid having throw rugs at the top or bottom of the stairs. If you do have throw rugs, attach them to the floor with carpet tape.  Make sure that you have a light switch at the top of the stairs and the bottom of the stairs. If you do not have them, ask someone to add them for you. What else can I do to help prevent falls?  Wear shoes that:  Do not have high heels.  Have rubber bottoms.  Are comfortable and fit you well.  Are closed at the  toe. Do not wear sandals.  If you use a stepladder:  Make sure that it is fully opened. Do not climb a closed stepladder.  Make sure that both sides of the stepladder are locked into place.  Ask someone to hold it for you, if possible.  Clearly mark and make sure that you can see:  Any grab bars or handrails.  First and last steps.  Where the edge of each step is.  Use tools that help you move around (mobility aids) if they are needed. These include:  Canes.  Walkers.  Scooters.  Crutches.  Turn on the lights when you go into a dark area. Replace any light bulbs as soon as they burn out.  Set up your furniture so you have a clear path. Avoid moving your furniture around.  If any of your floors are uneven, fix them.  If there are any pets around you, be aware of where they are.  Review your medicines with your doctor. Some medicines can make you feel dizzy. This can increase your chance of falling. Ask your doctor what other things that you can do to help prevent falls. This information is not intended to replace advice given to you by your health care provider. Make sure you discuss any questions you have with your health care provider. Document Released: 12/06/2008 Document Revised: 07/18/2015 Document Reviewed: 03/16/2014 Elsevier Interactive Patient Education  2017 [employer].

## 2020-05-26 ENCOUNTER — Other Ambulatory Visit: Payer: Self-pay | Admitting: Family Medicine

## 2020-07-05 ENCOUNTER — Other Ambulatory Visit: Payer: Self-pay

## 2020-07-05 ENCOUNTER — Ambulatory Visit (INDEPENDENT_AMBULATORY_CARE_PROVIDER_SITE_OTHER): Payer: Medicare HMO | Admitting: Family Medicine

## 2020-07-05 ENCOUNTER — Encounter: Payer: Self-pay | Admitting: Family Medicine

## 2020-07-05 VITALS — BP 138/76 | HR 58 | Temp 97.5°F | Wt 213.0 lb

## 2020-07-05 DIAGNOSIS — L600 Ingrowing nail: Secondary | ICD-10-CM | POA: Diagnosis not present

## 2020-07-05 NOTE — Progress Notes (Signed)
   Subjective:    Patient ID: Joe Murray, male    DOB: 31-Oct-1940, 80 y.o.   MRN: [id number]  HPI Here for pain in both great toes for the past 4 months. He wears loose fitting shoes.    Review of Systems  Constitutional: Negative.   Respiratory: Negative.   Cardiovascular: Negative.        Objective:   Physical Exam Constitutional:      Appearance: Normal appearance.  Cardiovascular:     Rate and Rhythm: Normal rate and regular rhythm.     Pulses: Normal pulses.     Heart sounds: Normal heart sounds.  Pulmonary:     Effort: Pulmonary effort is normal.     Breath sounds: Normal breath sounds.  Skin:    Comments: All 10 toenails are thickened with fungal involvement. Both great toenails are ingrown and tender. No active infection   Neurological:     Mental Status: He is alert.           Assessment & Plan:  Ingrown toenails. These will likely require removal. Refer to Podiatry. Alysia Penna, MD

## 2020-07-10 ENCOUNTER — Other Ambulatory Visit: Payer: Self-pay

## 2020-07-10 ENCOUNTER — Ambulatory Visit: Payer: Medicare HMO | Admitting: Podiatry

## 2020-07-10 ENCOUNTER — Encounter: Payer: Self-pay | Admitting: Podiatry

## 2020-07-10 DIAGNOSIS — M79675 Pain in left toe(s): Secondary | ICD-10-CM | POA: Diagnosis not present

## 2020-07-10 DIAGNOSIS — B351 Tinea unguium: Secondary | ICD-10-CM | POA: Diagnosis not present

## 2020-07-10 DIAGNOSIS — L600 Ingrowing nail: Secondary | ICD-10-CM | POA: Diagnosis not present

## 2020-07-10 DIAGNOSIS — M79674 Pain in right toe(s): Secondary | ICD-10-CM

## 2020-07-10 NOTE — Progress Notes (Signed)
Subjective:   Patient ID: Joe Murray, male   DOB: 80 y.o.   MRN: [id number]   HPI Patient presents with caregiver with elongated nails 1-5 both feet with painful nails and states the big toenails get ingrown and very sore in the corners.  Patient is not in complete mental good status and does smoke a very small amount and is not significantly active with balance issues   Review of Systems  All other systems reviewed and are negative.       Objective:  Physical Exam Vitals and nursing note reviewed.  Constitutional:      Appearance: He is well-developed.  Pulmonary:     Effort: Pulmonary effort is normal.  Musculoskeletal:        General: Normal range of motion.  Skin:    General: Skin is warm.  Neurological:     Mental Status: He is alert.     Vascular status mildly diminished bilateral but intact with incurvated nailbeds 1 through 5 both feet that are thick and sore with the hallux nails being very tender in the corner.  Patient is found to have good digital perfusion well oriented     Assessment:  Chronic mycotic nail infection with pain 1-5 both the feet    Plan:  H&P reviewed condition discussed permanent procedure versus debridement and recommended debridement of the nailbeds 1-5 both feet which was accomplished with no iatrogenic bleeding and this can be repeated routinely on a 68-month basis.  May require ultimate removal of the nail corners but do not recommend current

## 2020-07-10 NOTE — Patient Instructions (Signed)

## 2020-07-16 ENCOUNTER — Other Ambulatory Visit: Payer: Self-pay | Admitting: Neurology

## 2020-07-22 DIAGNOSIS — R69 Illness, unspecified: Secondary | ICD-10-CM | POA: Diagnosis not present

## 2020-07-22 DIAGNOSIS — E785 Hyperlipidemia, unspecified: Secondary | ICD-10-CM | POA: Diagnosis not present

## 2020-07-22 DIAGNOSIS — I2510 Atherosclerotic heart disease of native coronary artery without angina pectoris: Secondary | ICD-10-CM | POA: Diagnosis not present

## 2020-07-22 DIAGNOSIS — E1142 Type 2 diabetes mellitus with diabetic polyneuropathy: Secondary | ICD-10-CM | POA: Diagnosis not present

## 2020-07-22 DIAGNOSIS — E669 Obesity, unspecified: Secondary | ICD-10-CM | POA: Diagnosis not present

## 2020-07-22 DIAGNOSIS — E1165 Type 2 diabetes mellitus with hyperglycemia: Secondary | ICD-10-CM | POA: Diagnosis not present

## 2020-07-22 DIAGNOSIS — Z008 Encounter for other general examination: Secondary | ICD-10-CM | POA: Diagnosis not present

## 2020-07-22 DIAGNOSIS — G20 Parkinson's disease: Secondary | ICD-10-CM | POA: Diagnosis not present

## 2020-07-22 DIAGNOSIS — E1151 Type 2 diabetes mellitus with diabetic peripheral angiopathy without gangrene: Secondary | ICD-10-CM | POA: Diagnosis not present

## 2020-07-22 DIAGNOSIS — I252 Old myocardial infarction: Secondary | ICD-10-CM | POA: Diagnosis not present

## 2020-07-22 DIAGNOSIS — G309 Alzheimer's disease, unspecified: Secondary | ICD-10-CM | POA: Diagnosis not present

## 2020-08-23 ENCOUNTER — Other Ambulatory Visit: Payer: Self-pay | Admitting: Family Medicine

## 2020-09-04 ENCOUNTER — Encounter: Payer: Self-pay | Admitting: Neurology

## 2020-09-04 ENCOUNTER — Ambulatory Visit: Payer: Medicare HMO | Admitting: Neurology

## 2020-09-04 ENCOUNTER — Other Ambulatory Visit: Payer: Self-pay

## 2020-09-04 VITALS — BP 143/81 | HR 55 | Ht 68.5 in | Wt 215.0 lb

## 2020-09-04 DIAGNOSIS — R69 Illness, unspecified: Secondary | ICD-10-CM | POA: Diagnosis not present

## 2020-09-04 DIAGNOSIS — F0281 Dementia in other diseases classified elsewhere with behavioral disturbance: Secondary | ICD-10-CM

## 2020-09-04 DIAGNOSIS — G301 Alzheimer's disease with late onset: Secondary | ICD-10-CM

## 2020-09-04 DIAGNOSIS — G20 Parkinson's disease: Secondary | ICD-10-CM

## 2020-09-04 DIAGNOSIS — F02818 Dementia in other diseases classified elsewhere, unspecified severity, with other behavioral disturbance: Secondary | ICD-10-CM

## 2020-09-04 NOTE — Patient Instructions (Signed)
Try reducing Carbidopa/Levodopa to 1/2 tablet in AM, 1/2 tablet at noon, 1 tablet at supper. Call our office for an update in a month, if no change in drowsiness, we can go back to 1 tablet three times a day with meals and schedule a sleep study  2. Continue Memantine 10mg  twice a day  3. Recommend having your wife in the car when driving, or let your wife do the driving  4. Follow-up in 6 months, call for any changes   FALL PRECAUTIONS: Be cautious when walking. Scan the area for obstacles that may increase the risk of trips and falls. When getting up in the mornings, sit up at the edge of the bed for a few minutes before getting out of bed. Consider elevating the bed at the head end to avoid drop of blood pressure when getting up. Walk always in a well-lit room (use night lights in the walls). Avoid area rugs or power cords from appliances in the middle of the walkways. Use a walker or a cane if necessary and consider physical therapy for balance exercise. Get your eyesight checked regularly.  FINANCIAL OVERSIGHT: Supervision, especially oversight when making financial decisions or transactions is also recommended.  HOME SAFETY: Consider the safety of the kitchen when operating appliances like stoves, microwave oven, and blender. Consider having supervision and share cooking responsibilities until no longer able to participate in those. Accidents with firearms and other hazards in the house should be identified and addressed as well.  DRIVING: Regarding driving, in patients with progressive memory problems, driving will be impaired. We advise to have someone else do the driving if trouble finding directions or if minor accidents are reported. Independent driving assessment is available to determine safety of driving.  ABILITY TO BE LEFT ALONE: If patient is unable to contact 911 operator, consider using [employer], or when the need is there, arrange for someone to stay with patients. Smoking is a  fire hazard, consider supervision or cessation. Risk of wandering should be assessed by caregiver and if detected at any point, supervision and safe proof recommendations should be instituted.  MEDICATION SUPERVISION: Inability to self-administer medication needs to be constantly addressed. Implement a mechanism to ensure safe administration of the medications.  RECOMMENDATIONS FOR ALL PATIENTS WITH MEMORY PROBLEMS: 1. Continue to exercise (Recommend 30 minutes of walking everyday, or 3 hours every week) 2. Increase social interactions - continue going to [place] and enjoy social gatherings with friends and family 3. Eat healthy, avoid fried foods and eat more fruits and vegetables 4. Maintain adequate blood pressure, blood sugar, and blood cholesterol level. Reducing the risk of stroke and cardiovascular disease also helps promoting better memory. 5. Avoid stressful situations. Live a simple life and avoid aggravations. Organize your time and prepare for the next day in anticipation. 6. Sleep well, avoid any interruptions of sleep and avoid any distractions in the bedroom that may interfere with adequate sleep quality 7. Avoid sugar, avoid sweets as there is a strong link between excessive sugar intake, diabetes, and cognitive impairment The Mediterranean diet has been shown to help patients reduce the risk of progressive memory disorders and reduces cardiovascular risk. This includes eating fish, eat fruits and green leafy vegetables, nuts like almonds and hazelnuts, walnuts, and also use olive oil. Avoid fast foods and fried foods as much as possible. Avoid sweets and sugar as sugar use has been linked to worsening of memory function.  There is always a concern of gradual progression of memory problems.  If this is the case, then we may need to adjust level of care according to patient needs. Support, both to the patient and caregiver, should then be put into place.

## 2020-09-04 NOTE — Progress Notes (Signed)
Assessment/Plan:    Alzheimer's Dementia, late Onset with Behavioral Disturbance, r/0 LBD  Discussed the diagnosis of Alzheimer's Dementia likely possible Lewy Body component Discussed safety both in and out of the home.  Discussed the importance of regular daily schedule with inclusion of crossword puzzles to maintain brain function.  Continue to monitor mood. Naps should be scheduled and should be no longer than 60 minutes and should not occur after 2 PM.  Continue Mediterranean diet is recommended  Monitor driving, recommend wife in the car when using the car. Continue Memantine 10 mg twice daily.Side effects were discussed  Reduce Sinemet 25/100 mg  to 1/2 tab 1/2 tablet in AM, 1/2 tablet at noon, 1 tablet at supper Follow up in 6- wi months.   Case discussed with Dr. Delice Lesch who agrees with the plan     Subjective:    ED visits: none Hospital admissions: none   Joe Murray  is a 80 y.o.male with a history of hypertension, hyperlipidemia, diabetes, CAD, seen today in follow-up for Alzheimer's Dementia, late Onset with Behavioral Disturbance, with possible Lewy body dementia component.  This patient is accompanied in the office by his wife Joe Murray who supplements the history.  Previous records as well as any outside records available were reviewed prior to todays visit.  Patient is currently on Memantine 10 mg bid tolerating well.  For tremors, he is on Sinemet 25/100, 3 times daily. The patient feels that his memory is the same.  Joe Murray reports that his memory has not gotten worse, although he cannot remember their names of people that he knows greater than 60 years.  No hallucinations or paranoia have been reported.  No depression, or mood changes.  He sleeps "pretty good, sleeps too much " without  REM behavior. He is not interested in doing any activities, does not like to walk or adult day programs.  He denies vivid dreams, or sleepwalking.  He is bathing and dressing with  assistance from wife.  His wife is in charge of the medications, as he not cannot remember taking them.  She also does the finances.  He denies leaving objects in unusual places.  His appetite is good, wife states that he craves sweets.  He had periods of swallowing difficulties, as he attempts to eat big bites of food.  No sialorrhea is reported.  His voice is softer.  He cooks occasionally an egg, with the wife supervision.  He has not left the stove on.  However, last week, he left the water on throughout the day.  He ambulates with a cane only when he goes out of the house otherwise he does not use any support.  He has fallen about 2 months ago, without hitting his head.  This was a mechanical fall, next to the coffee table, going backwards.  He walks very cautiously, with wide steps, and much slower.  He drives very short distances, from home to the hair salon, and denies getting lost.  He denies headaches, double vision, dizziness, focal numbness or tingling.  His tremors are well controlled with Sinemet, which he takes around 8 in the morning, 5 PM, and then at 11 PM.  He denies any urine incontinence or retention, constipation or diarrhea.     History on Initial Assessment 04/06/2018: This is a 80 year old right-handed man with a history of hypertension, hyperlipidemia, CAD, diabetes, presenting for evaluation of memory loss. He reports his memory is lousy. He started noticing memory changes around  a year ago. His wife feels memory changes started a few years ago which she attributes to statin use, however in the past year, forgetfulness has worsened. He watches a lot of Westerns but would watch the same show 1-2 weeks later, his wife reminds him he has seen it already but he denies it. He forgets conversations. He does not remember people from the past, someone would pass away and he would not remember then. No significant difficulties following directions/instructions. His wife has not noticed  significant memory decline in the past year. He drives minimally due to vision issues, and denies getting lost. His wife manages finances. He was previously on several medications that were stopped 2 months ago due to fatigue and memory concerns. He is only taking glipizide. He had muscle pains on statin in the past, his wife feels the Repatha helped improve his cholesterol levels but also caused leg pain. He reports right leg pain mostly after walking a few steps, improving when he rests. His wife has noticed slowed movements, he would be very slow when opening things. He has a little trouble getting out of a chair and feels his balance is off when he first stands, but feels fine once he starts walking. His wife  has noticed over the past year that gait has slowed down and his stride has been shorter. He speaks in a low voice and does not move his mouth much when talking, his wife reports he has always been this way. No major personality changes, no paranoia or hallucinations. No family history of dementia, no history of significant head injuries. He drinks alcohol once a week.   He has had headaches over the past year localized over the back of his left ear. He describes a throbbing pain that only occurs in the evening around 30-40 minutes after he sits in his recliner. It would start wearing off after a few hours and resolves by bedtime. He takes Tylenol every evening. No associated nausea/vomiting, photo/phonophobia. He has trouble seeing the words on TV and reports monocular diplopia in his right eye. He denies any dizziness, dysarthria/dysphagia, neck/back pain, focal numbness/tingling, bowel/bladder dysfunction, anosmia, no falls. Sleep is good, he has some daytime drowsiness and snores minimally. No REM behavior disorder noted by wife. He has had occasional left hand tremors the past year, he denies any difficulty writing with his right hand or using utensils. He is a retired Systems developer.    Diagnostic Data: TSH normal, B12 low normal 312. MRI brain without contrast done 03/2018 did not show any acute changes. There was mild to moderate diffuse atrophy and chronic microvascular disease.     Neurocognitive Exam 02/21/20 Dr. Nicole Kindred "Mr. Charon Akamine is thus manifesting what appears to be a clinical dementia syndrome, currently at a mild level of progression. My sense is that Alzheimer's is most likely the primary problem given his demographics, the high base rate of the disorder, and his memory impairment. His Parkinsonism and possible other signs/symptoms (RBD/hallucinations) suggest that there may also be a Lewy Body Spectrum Component. A more minor vascular contributor is possible but that is unlikely a sole cause. He is already on Namenda. Would recommend that he increase his activity level, which I will discuss with him and his wife. He may also benefit from increasing exercise, initiating a MIND DASH diet, and cultivating more satisfying social relationships. Driving safety will need to be carefully monitored as his wife is starting to notice changes."    CURRENT MEDICATIONS:  Outpatient Encounter Medications as of 09/04/2020  Medication Sig   carbidopa-levodopa (SINEMET IR) 25-100 MG tablet TAKE 1 THREE TIMES DAILY WITH MEALS   Cholecalciferol (VITAMIN D3) 5000 UNITS CAPS Take 5,000 Units by mouth daily.   Cinnamon 500 MG capsule Take 2,000 mg by mouth daily.   fish oil-omega-3 fatty acids 1000 MG capsule Take 2 g by mouth daily.   glipiZIDE (GLUCOTROL) 5 MG tablet TAKE 1 TABLET BY MOUTH TWICE DAILY BEFORE A MEAL   Lancets (BD LANCET ULTRAFINE 30G) MISC USE ONE  TO CHECK GLUCOSE ONCE DAILY   MAGNESIUM CHLORIDE PO Take 1 tablet by mouth daily.   memantine (NAMENDA) 10 MG tablet Take 1 tablet every night for 2 weeks, then increase to 1 tablet twice a day (Patient taking differently: Take 10 mg by mouth 2 (two) times daily. Take 1 tablet every night for 2 weeks, then increase to  1 tablet twice a day)   ONETOUCH ULTRA test strip USE TO TEST DAILY   potassium gluconate 595 MG TABS tablet Take 595 mg by mouth.   [place] 420 MG/3.5ML SOCT INJECT 420 MG INTO THE SKIN EVERY 30 DAYS   aspirin EC 81 MG tablet Take 81 mg by mouth daily. (Patient not taking: No sig reported)   nitroGLYCERIN (NITROSTAT) 0.4 MG SL tablet PLACE 1 TABLET UNDER THE TONGUE EVERY 5 MINUTES FOR 3 DOSES AS NEEDED FOR CHEST PAIN (Patient not taking: No sig reported)   No facility-administered encounter medications on file as of 09/04/2020.     Objective:     PHYSICAL EXAMINATION:    VITALS:   Vitals:   09/04/20 0823  BP: (!) 143/81  Pulse: (!) 55  SpO2: 95%  Weight: 215 lb (97.5 kg)  Height: 5' 8.5" (1.74 m)    GEN:  The patient appears stated age and is in NAD. HEENT:  Normocephalic, atraumatic.   Neurological examination:  General: NAD, well-groomed, appears stated age. Orientation: alert and oriented to person, place and time.   Cranial nerves: There is good facial symmetry.The speech is not fluent but clear. No aphasia or dysarthria. Fund of knowledge is appropriate. Recent and remote memory are impaired. Attention and concentration are reduced. Able to name objects and repeat phrases.  Hearing is intact to conversational tone.    Sensation: Sensation is intact to light touch throughout Motor: Strength is at least antigravity x4.     Montreal Cognitive Assessment  11/11/2018 04/06/2018  Visuospatial/ Executive (0/5) - 4  Naming (0/3) - 3  Attention: Read list of digits (0/2) 2 2  Attention: Read list of letters (0/1) 1 1  Attention: Serial 7 subtraction starting at 100 (0/3) 3 3  Language: Repeat phrase (0/2) 1 1  Language : Fluency (0/1) 0 0  Abstraction (0/2) 1 2  Delayed Recall (0/5) 0 2  Orientation (0/6) 5 6  Total - 24  Adjusted Score (based on education) - 25     MMSE - Mini Mental State Exam 02/21/2020 12/28/2019  Orientation to time 3 4   Orientation to Place 5 5  Registration 3 3  Attention/ Calculation 3 5  Recall 1 0  Language- name 2 objects 2 2  Language- repeat 1 1  Language- follow 3 step command 3 3  Language- read & follow direction 1 1  Write a sentence 1 1  Copy design 1 1  Total score 24 26      Movement examination: Tone: There is normal tone in the  UE/LE Abnormal movements:   His left hand tremor is improved, no postural endpoint tremor.Mild cogwheeling bilaterally   No myoclonus.  No asterixis.   Coordination:  There is no decremation with RAM's. Normal finger to nose  Gait and Station: The patient has no difficulty arising out of a deep-seated chair without the use of the hands. The patient's stride length is good.   Gait is steady, ambulating without a walker, but has small steps, wider gait, reduced arm swing.  Romberg negative.  No postural instability.   CBC CBC Latest Ref Rng & Units 11/07/2019 11/09/2018 08/27/2017  WBC 3.8 - 10.8 Thousand/uL 7.2 6.1 7.0  Hemoglobin 13.2 - 17.1 g/dL 14.8 14.7 15.0  Hematocrit 38.5 - 50.0 % 44.4 43.9 43.8  Platelets 140 - 400 Thousand/uL 204 191.0 220.0     CMP Latest Ref Rng & Units 11/07/2019 06/01/2019 11/09/2018  Glucose 65 - 99 mg/dL 129(H) 134(H) 135(H)  BUN 7 - 25 mg/dL 24 22 22   Creatinine 0.70 - 1.18 mg/dL 1.26(H) 1.23 1.37  Sodium 135 - 146 mmol/L 138 142 136  Potassium 3.5 - 5.3 mmol/L 4.5 4.5 4.6  Chloride 98 - 110 mmol/L 107 104 103  CO2 20 - 32 mmol/L 24 22 24   Calcium 8.6 - 10.3 mg/dL 9.3 9.2 9.4  Total Protein 6.1 - 8.1 g/dL 6.6 6.6 6.3  Total Bilirubin 0.2 - 1.2 mg/dL 0.5 0.3 0.5  Alkaline Phos 39 - 117 IU/L - 55 45  AST 10 - 35 U/L 19 18 11   ALT 9 - 46 U/L 13 14 9        Cc:  Laurey Morale, MD Sharene Butters, PA-C

## 2020-09-04 NOTE — Progress Notes (Signed)
Patient was seen, evaluated, and treatment plan was discussed with the Advanced Practice Provider. Discussed Neurocognitive test results from 01/2020 indicating memory storage type problems, executive dysfunction, diminished processing efficiency, likely due to Alzheimer's disease. His parkinsonism suggests there may be a Lewy Body spectrum component. Discussed with patient and his wife that he does not fulfill all criteria for idiopathic Parkinson's disease, hence the diagnosis. DATscan may help clarify if there is parkinsonian pathology, but would not differentiate between PD and LBD. They both deny any hallucinations or RBD today. Discussed that management would be our current regimen of Memantine 10mg  BID and Sinemet. His wife reports daytime drowsiness despite getting good sleep at night. They will try reducing Sinemet 25/100mg  to 1/2 tab in AM, 1/2 tab at lunch, 1 tab at dinner and update our office in a month if any change in daytime drowsiness. If none, will increase back to 1 tab TID with meals and schedule a sleep study to rule out sleep apnea. He does not like driving and would only drive to the barber, advised to have his wife present at all times or have her start doing all the driving. I have also reviewed the orders written for this patient which were under my direction. I agree with the findings and the plan of care as documented by the Advanced Practice Provider.

## 2020-10-11 ENCOUNTER — Ambulatory Visit: Payer: Medicare HMO | Admitting: Podiatry

## 2020-11-07 ENCOUNTER — Ambulatory Visit (INDEPENDENT_AMBULATORY_CARE_PROVIDER_SITE_OTHER): Payer: Medicare HMO | Admitting: Family Medicine

## 2020-11-07 ENCOUNTER — Other Ambulatory Visit: Payer: Self-pay

## 2020-11-07 ENCOUNTER — Encounter: Payer: Self-pay | Admitting: Family Medicine

## 2020-11-07 ENCOUNTER — Telehealth: Payer: Self-pay

## 2020-11-07 VITALS — BP 120/80 | HR 48 | Temp 98.5°F | Ht 68.5 in | Wt 207.0 lb

## 2020-11-07 DIAGNOSIS — E118 Type 2 diabetes mellitus with unspecified complications: Secondary | ICD-10-CM | POA: Diagnosis not present

## 2020-11-07 DIAGNOSIS — I2510 Atherosclerotic heart disease of native coronary artery without angina pectoris: Secondary | ICD-10-CM | POA: Diagnosis not present

## 2020-11-07 DIAGNOSIS — G301 Alzheimer's disease with late onset: Secondary | ICD-10-CM

## 2020-11-07 DIAGNOSIS — G309 Alzheimer's disease, unspecified: Secondary | ICD-10-CM | POA: Insufficient documentation

## 2020-11-07 DIAGNOSIS — Z9861 Coronary angioplasty status: Secondary | ICD-10-CM

## 2020-11-07 DIAGNOSIS — I10 Essential (primary) hypertension: Secondary | ICD-10-CM | POA: Diagnosis not present

## 2020-11-07 DIAGNOSIS — E785 Hyperlipidemia, unspecified: Secondary | ICD-10-CM | POA: Diagnosis not present

## 2020-11-07 DIAGNOSIS — K219 Gastro-esophageal reflux disease without esophagitis: Secondary | ICD-10-CM

## 2020-11-07 DIAGNOSIS — N401 Enlarged prostate with lower urinary tract symptoms: Secondary | ICD-10-CM

## 2020-11-07 DIAGNOSIS — G20 Parkinson's disease: Secondary | ICD-10-CM

## 2020-11-07 DIAGNOSIS — R42 Dizziness and giddiness: Secondary | ICD-10-CM | POA: Diagnosis not present

## 2020-11-07 DIAGNOSIS — F0280 Dementia in other diseases classified elsewhere without behavioral disturbance: Secondary | ICD-10-CM

## 2020-11-07 DIAGNOSIS — N138 Other obstructive and reflux uropathy: Secondary | ICD-10-CM

## 2020-11-07 DIAGNOSIS — E559 Vitamin D deficiency, unspecified: Secondary | ICD-10-CM

## 2020-11-07 DIAGNOSIS — G20A1 Parkinson's disease without dyskinesia, without mention of fluctuations: Secondary | ICD-10-CM

## 2020-11-07 LAB — HEPATIC FUNCTION PANEL
ALT: 12 U/L (ref 0–53)
AST: 12 U/L (ref 0–37)
Albumin: 4.4 g/dL (ref 3.5–5.2)
Alkaline Phosphatase: 55 U/L (ref 39–117)
Bilirubin, Direct: 0.1 mg/dL (ref 0.0–0.3)
Total Bilirubin: 0.6 mg/dL (ref 0.2–1.2)
Total Protein: 7.1 g/dL (ref 6.0–8.3)

## 2020-11-07 LAB — CBC WITH DIFFERENTIAL/PLATELET
Basophils Absolute: 0 10*3/uL (ref 0.0–0.1)
Basophils Relative: 0.3 % (ref 0.0–3.0)
Eosinophils Absolute: 0.2 10*3/uL (ref 0.0–0.7)
Eosinophils Relative: 2.9 % (ref 0.0–5.0)
HCT: 45.5 % (ref 39.0–52.0)
Hemoglobin: 15.1 g/dL (ref 13.0–17.0)
Lymphocytes Relative: 34.3 % (ref 12.0–46.0)
Lymphs Abs: 2.5 10*3/uL (ref 0.7–4.0)
MCHC: 33.3 g/dL (ref 30.0–36.0)
MCV: 95.4 fl (ref 78.0–100.0)
Monocytes Absolute: 0.6 10*3/uL (ref 0.1–1.0)
Monocytes Relative: 8.9 % (ref 3.0–12.0)
Neutro Abs: 3.9 10*3/uL (ref 1.4–7.7)
Neutrophils Relative %: 53.6 % (ref 43.0–77.0)
Platelets: 199 10*3/uL (ref 150.0–400.0)
RBC: 4.77 Mil/uL (ref 4.22–5.81)
RDW: 13 % (ref 11.5–15.5)
WBC: 7.2 10*3/uL (ref 4.0–10.5)

## 2020-11-07 LAB — BASIC METABOLIC PANEL
BUN: 21 mg/dL (ref 6–23)
CO2: 27 mEq/L (ref 19–32)
Calcium: 9.7 mg/dL (ref 8.4–10.5)
Chloride: 101 mEq/L (ref 96–112)
Creatinine, Ser: 1.46 mg/dL (ref 0.40–1.50)
GFR: 45.06 mL/min — ABNORMAL LOW (ref >60.00)
Glucose, Bld: 148 mg/dL — ABNORMAL HIGH (ref 70–99)
Potassium: 4.7 mEq/L (ref 3.5–5.1)
Sodium: 137 mEq/L (ref 135–145)

## 2020-11-07 LAB — LIPID PANEL
Cholesterol: 139 mg/dL (ref 0–200)
HDL: 56.4 mg/dL (ref >39.00)
LDL Cholesterol: 51 mg/dL (ref 0–99)
NonHDL: 82.18
Total CHOL/HDL Ratio: 2
Triglycerides: 158 mg/dL — ABNORMAL HIGH (ref 0.0–149.0)
VLDL: 31.6 mg/dL (ref 0.0–40.0)

## 2020-11-07 LAB — HEMOGLOBIN A1C: Hgb A1c MFr Bld: 6.9 % — ABNORMAL HIGH (ref 4.6–6.5)

## 2020-11-07 LAB — PSA: PSA: 1.27 ng/mL (ref 0.10–4.00)

## 2020-11-07 LAB — VITAMIN D 25 HYDROXY (VIT D DEFICIENCY, FRACTURES): VITD: >120 ng/mL

## 2020-11-07 LAB — TSH: TSH: 2.58 u[IU]/mL (ref 0.35–5.50)

## 2020-11-07 NOTE — Telephone Encounter (Signed)
Received a note from lab stating pt has a critical VitD of 120. Will send to PCP

## 2020-11-07 NOTE — Progress Notes (Signed)
Subjective:    Patient ID: Joe Murray, male    DOB: 1940/04/15, 80 y.o.   MRN: [id number]  HPI Here to follow up on issues. He has no complaints today. He sees Dr. Delice Lesch for dementia and Parkinson's disease. She started him on Carbidopa-Levadopa but he stopped taking this after one week because he says it made him "feel bad". I attempted to have him describe what he means by that, but he could not. His BP is stable. His vertigo has been stable. Appetite is good.    Review of Systems  Constitutional:  Positive for fatigue.  HENT: Negative.    Eyes: Negative.   Respiratory: Negative.    Cardiovascular: Negative.   Gastrointestinal: Negative.   Genitourinary: Negative.   Musculoskeletal: Negative.   Skin: Negative.   Neurological:  Positive for tremors and weakness.  Psychiatric/Behavioral: Negative.        Objective:   Physical Exam Constitutional:      General: He is not in acute distress.    Appearance: Normal appearance. He is well-developed. He is not diaphoretic.     Comments: He is quite bradykinetic   HENT:     Head: Normocephalic and atraumatic.     Right Ear: External ear normal.     Left Ear: External ear normal.     Nose: Nose normal.     Mouth/Throat:     Pharynx: No oropharyngeal exudate.  Eyes:     General: No scleral icterus.       Right eye: No discharge.        Left eye: No discharge.     Conjunctiva/sclera: Conjunctivae normal.     Pupils: Pupils are equal, round, and reactive to light.  Neck:     Thyroid: No thyromegaly.     Vascular: No JVD.     Trachea: No tracheal deviation.  Cardiovascular:     Rate and Rhythm: Normal rate and regular rhythm.     Heart sounds: Normal heart sounds. No murmur heard.   No friction rub. No gallop.  Pulmonary:     Effort: Pulmonary effort is normal. No respiratory distress.     Breath sounds: Normal breath sounds. No wheezing or rales.  Chest:     Chest wall: No tenderness.  Abdominal:     General:  Bowel sounds are normal. There is no distension.     Palpations: Abdomen is soft. There is no mass.     Tenderness: There is no abdominal tenderness. There is no guarding or rebound.  Genitourinary:    Penis: No tenderness.   Musculoskeletal:        General: No tenderness. Normal range of motion.     Cervical back: Neck supple.  Lymphadenopathy:     Cervical: No cervical adenopathy.  Skin:    General: Skin is warm and dry.     Coloration: Skin is not pale.     Findings: No erythema or rash.  Neurological:     Mental Status: He is alert and oriented to person, place, and time.     Cranial Nerves: No cranial nerve deficit.     Motor: No abnormal muscle tone.     Coordination: Coordination normal.     Deep Tendon Reflexes: Reflexes are normal and symmetric. Reflexes normal.  Psychiatric:        Behavior: Behavior normal.        Thought Content: Thought content normal.        Judgment: Judgment normal.  Assessment & Plan:  His HTN and CAD and vertigo are stable. As far as dementia and Parkinsons, he has decided he does not want to take Carbidopa-Levadopa. I urged him to inform Dr. Delice Lesch about this so she can decide what the next step may be. Get fasting labs for lipids, an A1c, etc. We spent 35 minutes reviewing records and discussing these issues.  Alysia Penna, MD

## 2020-11-08 NOTE — Telephone Encounter (Signed)
Yes I saw the results. Please see my Result Note

## 2020-11-08 NOTE — Progress Notes (Signed)
Called pt no answer or vm

## 2020-11-13 DIAGNOSIS — Z947 Corneal transplant status: Secondary | ICD-10-CM | POA: Diagnosis not present

## 2020-11-13 DIAGNOSIS — H18512 Endothelial corneal dystrophy, left eye: Secondary | ICD-10-CM | POA: Diagnosis not present

## 2020-11-13 DIAGNOSIS — Z961 Presence of intraocular lens: Secondary | ICD-10-CM | POA: Diagnosis not present

## 2020-11-25 ENCOUNTER — Telehealth: Payer: Self-pay | Admitting: Cardiology

## 2020-11-25 NOTE — Telephone Encounter (Signed)
Pt c/o medication issue:  1. Name of Medication: Repatha   2. How are you currently taking this medication (dosage and times per day)? Once a month 3. Are you having a reaction (difficulty breathing--STAT)? No  4. What is your medication issue? Patient wife states the little pin had fell and medication was still in the bottle. Please advise. [facility] will be calling

## 2020-11-26 ENCOUNTER — Other Ambulatory Visit: Payer: Self-pay | Admitting: Neurology

## 2020-11-27 ENCOUNTER — Telehealth: Payer: Self-pay | Admitting: Family Medicine

## 2020-11-27 NOTE — Chronic Care Management (AMB) (Signed)
  Chronic Care Management   Note  11/27/2020 Name: TYRRELL STEPHENS MRN: [id number] DOB: [birth year]  JORGEN WOLFINGER is a 80 y.o. year old male who is a primary care patient of Laurey Morale, MD. I reached out to [name] by phone today in response to a referral sent by Mr. Lockie Pares Patti's PCP, Laurey Morale, MD.   Mr. Downie was given information about Chronic Care Management services today including:  CCM service includes personalized support from designated clinical staff supervised by his physician, including individualized plan of care and coordination with other care providers 24/7 contact phone numbers for assistance for urgent and routine care needs. Service will only be billed when office clinical staff spend 20 minutes or more in a month to coordinate care. Only one practitioner may furnish and bill the service in a calendar month. The patient may stop CCM services at any time (effective at the end of the month) by phone call to the office staff.   [name] Askin/WIFE verbally agreed to assistance and services provided by embedded care coordination/care management team today.  Follow up plan:   Tatjana Secretary/administrator

## 2020-11-28 NOTE — Telephone Encounter (Signed)
Called and spoke w/pt's wife and stated that once we receive a form from [employer] we can fill it out and fax it in pt voiced understanding

## 2020-11-28 NOTE — Telephone Encounter (Signed)
Please let patient know that when [employer] sends over fax we will complete and patient will have Repatha mailed to them

## 2020-12-10 ENCOUNTER — Telehealth: Payer: Self-pay | Admitting: Cardiology

## 2020-12-10 NOTE — Telephone Encounter (Signed)
Patient's wife called about the repatha that had to be sent back sent back to the company last month because the injection didn't work.  The company states they emailed [place] another script so they can replace it. Wife states they told her they haven't heard from [place].  They gave her a case number it's #[id number].  They can be reached at [phone].

## 2020-12-10 NOTE — Telephone Encounter (Signed)
[place] and gave verbal order for replacement Repatha [employer] device.

## 2020-12-31 ENCOUNTER — Telehealth: Payer: Self-pay | Admitting: Pharmacist

## 2020-12-31 NOTE — Chronic Care Management (AMB) (Signed)
Chronic Care Management Pharmacy Assistant   Name: Joe Murray  MRN: [id number] DOB: 11-09-1940  Reason for Encounter: Chart prep for initial visit with Jeni Salles the clinical pharmacist on 01/07/2021   Conditions to be addressed/monitored: CAD, HTN, DMII, Dementia, GERD, and Parkinsons  Recent office visits:  11/07/2020 Alysia Penna MD (PCP) - Patient was seen for Essential hypertension and additional issues. No medication changes.  No follow up noted.  07/05/2020 Kateri Plummer MD (PCP) - Patient was seen for Ingrown toenail of both feet. Referral to Podiatry. No medication changes. No follow up noted.  03/26/2020 Ofilia Neas LPN - Medicare annual wellness exam.  Recent consult visits:  11/13/2020 Marilynne Halsted, MD (Eye center) - Patient was seen for follow up corneal transplant. Started Pred Forte 1% once per day and Systane Ultra 3-4 times daily. No follow up noted. Follow up in 6 months.  09/04/2020 Ellouise Newer MD (neurology) - Patient was seen for Alzheimers demential and additional issue. No medication changes.   07/10/2020 Ila Mcgill DPM (podiatry) - Patient was seen for Pain due to onychomycosis and an additional issue. No medication changes. No follow up noted.  Hospital visits:  None  Medications: Outpatient Encounter Medications as of 12/31/2020  Medication Sig   aspirin EC 81 MG tablet Take 81 mg by mouth daily.   carbidopa-levodopa (SINEMET IR) 25-100 MG tablet TAKE 1 TABLET BY MOUTH THREE TIMES DAILY WITH MEALS   Cholecalciferol (VITAMIN D3) 5000 UNITS CAPS Take 5,000 Units by mouth daily.   Cinnamon 500 MG capsule Take 2,000 mg by mouth daily.   fish oil-omega-3 fatty acids 1000 MG capsule Take 2 g by mouth daily.   glipiZIDE (GLUCOTROL) 5 MG tablet TAKE 1 TABLET BY MOUTH TWICE DAILY BEFORE A MEAL   Lancets (BD LANCET ULTRAFINE 30G) MISC USE ONE  TO CHECK GLUCOSE ONCE DAILY   MAGNESIUM CHLORIDE PO Take 1 tablet by mouth daily.   memantine  (NAMENDA) 10 MG tablet Take 1 tablet every night for 2 weeks, then increase to 1 tablet twice a day (Patient taking differently: Take 10 mg by mouth 2 (two) times daily. Take 1 tablet every night for 2 weeks, then increase to 1 tablet twice a day)   nitroGLYCERIN (NITROSTAT) 0.4 MG SL tablet PLACE 1 TABLET UNDER THE TONGUE EVERY 5 MINUTES FOR 3 DOSES AS NEEDED FOR CHEST PAIN   ONETOUCH ULTRA test strip USE TO TEST DAILY   potassium gluconate 595 MG TABS tablet Take 595 mg by mouth.   [place] 420 MG/3.5ML SOCT INJECT 420 MG INTO THE SKIN EVERY 30 DAYS   No facility-administered encounter medications on file as of 12/31/2020.   Fill History CARB/LEVO 25-100MG   TAB 11/27/2020 30   REPATHA PUSH 420 MG/3.5ML INJ 11/19/2020 30   MEMANTINE HCL 10MG    TAB 12/02/2020 30   GlipiZIDE 5MG        TAB 09/19/2020 90   Have you seen any other providers since your last visit? **no  Any changes in your medications or health? no  Any side effects from any medications? no  Do you have an symptoms or problems not managed by your medications? no  Any concerns about your health right now? Yes, he is sleeping a lot and his wife feels it may be coming from carbidopa-levodopa or memantine as this is when it started.  She mentioned that Dr. Sarajane Jews had him cut the dose of carbidopa-lovodopa in half, she did that for a little while  but it did not make any difference.   Has your provider asked that you check blood pressure, blood sugar, or follow special diet at home? Yes, patient does check his blood sugars daily, his wife is unsure of the readings but believes they are good running around 115. Patients wife checks his blood pressures on occasion, she doesn't recall the actual readings.  Patient will eat for breakfast a sausage and egg biscuit and honeydew most days and on occasion will eat a packet of oatmeal.  For lunch he will have a protein bar. For dinner he will have a meat and vegetable or soup,  it varies depending on what his wife fixes for dinner.  Patients wife also mentions that he sometimes will forget to take his medications, he currently has about a week of Glipizide and will be calling [facility] today to have this filled.  Do you get any type of exercise on a regular basis? No, very little activity  Can you think of a goal you would like to reach for your health? No  Do you have any problems getting your medications? No, however, Repatha has gone from 100/mo to 163/mo.  Is there anything that you would like to discuss during the appointment? Nothing specific.  Please bring blood pressure machine, medications and supplements to appointment  Care Gaps: AWV - completed 03/26/2020  Last BP - 120/80 on 11/07/2020 Last A1C - 6.9 on 11/07/2020 Foot exam - never done Microalbumin - overdue Eye exam - overdue Covid vaccine - overdue Flu - due  Star Rating Drugs: Glipizide 5 mg - last filled 09/19/2020 90DS at [facility], verified with [place] Pharmacist Assistant [phone]

## 2021-01-06 ENCOUNTER — Other Ambulatory Visit: Payer: Self-pay

## 2021-01-06 ENCOUNTER — Other Ambulatory Visit: Payer: Self-pay | Admitting: Neurology

## 2021-01-06 ENCOUNTER — Encounter: Payer: Self-pay | Admitting: Cardiology

## 2021-01-06 ENCOUNTER — Ambulatory Visit: Payer: Medicare HMO | Admitting: Cardiology

## 2021-01-06 ENCOUNTER — Telehealth: Payer: Self-pay | Admitting: Pharmacist

## 2021-01-06 VITALS — BP 134/86 | HR 50 | Ht 69.0 in | Wt 211.6 lb

## 2021-01-06 DIAGNOSIS — R5382 Chronic fatigue, unspecified: Secondary | ICD-10-CM

## 2021-01-06 DIAGNOSIS — E785 Hyperlipidemia, unspecified: Secondary | ICD-10-CM | POA: Diagnosis not present

## 2021-01-06 DIAGNOSIS — Z9861 Coronary angioplasty status: Secondary | ICD-10-CM

## 2021-01-06 DIAGNOSIS — M791 Myalgia, unspecified site: Secondary | ICD-10-CM | POA: Diagnosis not present

## 2021-01-06 DIAGNOSIS — I214 Non-ST elevation (NSTEMI) myocardial infarction: Secondary | ICD-10-CM

## 2021-01-06 DIAGNOSIS — T466X5A Adverse effect of antihyperlipidemic and antiarteriosclerotic drugs, initial encounter: Secondary | ICD-10-CM

## 2021-01-06 DIAGNOSIS — I4589 Other specified conduction disorders: Secondary | ICD-10-CM

## 2021-01-06 DIAGNOSIS — I2510 Atherosclerotic heart disease of native coronary artery without angina pectoris: Secondary | ICD-10-CM

## 2021-01-06 DIAGNOSIS — I10 Essential (primary) hypertension: Secondary | ICD-10-CM | POA: Diagnosis not present

## 2021-01-06 NOTE — Chronic Care Management (AMB) (Signed)
    Chronic Care Management Pharmacy Assistant   Name: Joe Murray  MRN: [id number] DOB: 1940/12/08  01/07/2021 APPOINTMENT REMINDER   Called [name], No answer, left message of appointment on 01/07/2021 at 9:30 via office visit with Jeni Salles, Pharm D. Notified to have all medications, supplements, blood pressure and/or blood sugar logs available during appointment and to return call if need to reschedule.   Care Gaps: AWV - completed 03/26/2020  Last BP - 120/80 on 11/07/2020 Last A1C - 6.9 on 11/07/2020 Foot exam - never done Microalbumin - overdue Eye exam - overdue Covid vaccine - overdue Flu - due  Star Rating Drug: Glipizide 5 mg - last filled 09/19/2020 90DS at [facility], verified with Katrina  Any gaps in medications fill history? Yes  [place]  Clinical Pharmacist Assistant [phone]

## 2021-01-06 NOTE — Patient Instructions (Signed)
Medication Instructions:  NO CHANGES  *If you need a refill on your cardiac medications before your next appointment, please call your pharmacy*   Lab Work: NOT NEEDED   Testing/Procedures: NOT NEEDED   Follow-Up: At [facility], you and your health needs are our priority.  As part of our continuing mission to provide you with exceptional heart care, we have created designated Provider Care Teams.  These Care Teams include your primary Cardiologist (physician) and Advanced Practice Providers (APPs -  Physician Assistants and Nurse Practitioners) who all work together to provide you with the care you need, when you need it.  We recommend signing up for the patient portal called "MyChart".  Sign up information is provided on this After Visit Summary.  MyChart is used to connect with patients for Virtual Visits (Telemedicine).  Patients are able to view lab/test results, encounter notes, upcoming appointments, etc.  Non-urgent messages can be sent to your provider as well.   To learn more about what you can do with MyChart, go to [email or web address].    Your next appointment:   12 month(s)  The format for your next appointment:   In Person  Provider:   Glenetta Hew, MD     Other Instructions [place]

## 2021-01-06 NOTE — Progress Notes (Signed)
Primary Care Provider: Laurey Morale, MD Cardiologist: Glenetta Hew, MD Electrophysiologist: None  Clinic Note: No chief complaint on file.  ===================================  ASSESSMENT/PLAN   Problem List Items Addressed This Visit       Cardiology Problems   CAD S/P percutaneous coronary angioplasty - LADx2, & RCA x 3 (Chronic)    Status post PCI back in 2017.  Has not had any real anginal symptoms since that timeframe.  Remains on aspirin alone.-Plavix stopped because of excess bleeding/bruising.  Okay to hold aspirin 5 to 7 days preop for surgeries or procedures.      Relevant Orders   EKG 12-Lead (Completed)   Coronary artery disease involving native coronary artery of native heart without angina pectoris (Chronic)    Stable.  Doing well.  Only notes may be some fatigue symptoms but no real angina or heart failure.  He is very sedentary and somewhat immobilized by his Parkinson's.  Is on Repatha for lipids, and aspirin monotherapy.. Not on any antihypertensives/antianginals (beta-blocker, ARB/ACE inhibitor Blocker) in order to allow for permissive hypertension.      Dyslipidemia, goal LDL below 70 (Chronic)    Now is on Repatha.  Most recent A1c is 51 September.  Well-controlled.  Stable.  Not having any myalgia issues or significant arthralgia issues.  Memory issues seem to have improved a little bit Statin, but now he is having memory loss from dementia and Parkinson's.      Essential hypertension -> allowing permissive hypertension.  Medication stopped because of fatigue and orthostatic dizziness. (Chronic)    Very labile blood pressures.  He says at home his pressures remain above 110/70 -170/90.  Today's pressure is probably average for him.  With such dramatic swings in pressures, I would not want to put him on any medication whatsoever.    Provided he does not really have any untoward symptoms of hypertension, would not treat.       NSTEMI (non-ST  elevated myocardial infarction) ([place]) - Primary (Chronic)    Almost 6 years out from last cardiac catheterization, presumably for ACS.  He is pretty active, as active as it could be because of his Parkinson's and unsteady gait.  Unfortunately he is no longer really working out as much.  Not really a true anginal or heart failure symptoms.  Breathing has improved of late. Currently does not have a diuretic listed.        Relevant Orders   EKG 12-Lead (Completed)     Other   Chronotropic incompetence (Chronic)    Has difficulty getting his heart rate up.  Avoiding a renal lesions.  I have previously referred him to EP, but not felt to be a PPM candidate as yet.  Follow closely.  If has symptoms of worsening fatigue, will consider sending back to EP.      Relevant Orders   EKG 12-Lead (Completed)   Fatigue (Chronic)    Murray related to deconditioning, and lack of mobility with COVID.  Very poor balance.  He is scared to do gym exercises that are now.      Myalgia due to statin (Chronic)    Has not been able to tolerate any statins.  Was started on Repatha.  Lipids well controlled now.       ===================================  HPI:    Joe Murray is a 80 y.o. male with a PMH notable for CAD-PCI (last PCI in 2017), HTN, HLD, DM-2 and longstanding Parkinson's Disease with Progressive Dementia who  presents today for annual follow-up.  Joe Murray was last seen on January 01, 2020: He was doing okay from a cardiac standpoint.  Had lost weight with diet and exercise.  Was doing PT/OT to help with his balance and walking.  Noted some exertional dyspnea but nothing significant.  Stopped doing the gym-treadmill because of poor balance and knee pain.  We Will Still Try to Do the "Total Gym " Occasional palpitations just a few skipped beats here and there.  No chest pain or pressure   No beta-blocker because of fatigue, sinus bradycardia and chronotropic competence.   Off of Imdur  because of headache.  On Repatha. Still not criteria for PPM. Plan was to allow for permissive hypertension.  Recent Hospitalizations:  none  Reviewed  CV studies:    The following studies were reviewed today: (if available, images/films reviewed: From Epic Chart or Care Everywhere) none:  Interval History:   Joe Murray is here today for follow-up doing fairly well.  He had a fall about 2 to 3 months ago he tried to step around his lawnmower wearing can get out of the way.  He then fell backwards because he lost his balance.  He is not been as active since then.  Unfortunately being away from exercising he is sort of lost the "appetite for exercise."  He does not have the motivation anymore.  No longer able to walk as fluidly as he has been.  No longer doing that Total Gym either.  Just very concerned about balance issues.  Despite this, he has not had any cardiac issues of chest tightness pressure pain with rest or exertion.  He may little Murray short breath than usual if he does Murray than routine activities, but do not worsen because he is pretty sedentary.  No PND orthopnea with trivial STEMI leg swelling.  Goes down with elevation.    Still notes mild occasional palpitations but nothing prolonged.  Simply notes occasional skipped beats.  CV Review of Symptoms (Summary) Cardiovascular ROS: no chest pain or dyspnea on exertion positive for - irregular heartbeat, shortness of breath, and exertional dyspnea with Murray than usual exertion. negative for - chest pain, dyspnea on exertion, edema, irregular heartbeat, loss of consciousness, murmur, or shortness of breath  REVIEWED OF SYSTEMS   Review of Systems  Constitutional:  Positive for malaise/fatigue and weight loss.       Has been working on nutrition issues.  Respiratory:  Positive for shortness of breath (On occasion-can happen, but also can happen if he overexerts.).   Cardiovascular:  Positive for palpitations and leg  swelling (Minimal). Negative for claudication.       Lightheadedness and dizziness  Gastrointestinal:  Negative for blood in stool and melena.  Genitourinary:  Negative for dysuria, hematuria and urgency.  Musculoskeletal:  Positive for back pain, falls (Had a fall about 2 to 3 weeks ago.  Was trying to move out of the way of the lawnmower and fell backwards.  No head injury or LOC.) and joint pain. Negative for myalgias (Just muscle stiffness).  Neurological:  Positive for dizziness and headaches.  Endo/Heme/Allergies:  Does not bruise/bleed easily (Better off of aspirin.).  Psychiatric/Behavioral:  Positive for memory loss. Negative for depression and suicidal ideas.    EF wears it the block to a block of the hour I have reviewed and (if needed) personally updated the patient's problem list, medications, allergies, past medical and surgical history, social and family history.  PAST MEDICAL HISTORY   Past Medical History:  Diagnosis Date   Alzheimer's dementia [facility])    sees Dr. Ellouise Newer   CAD S/P percutaneous coronary angioplasty 10/'03; 3/'09; 7/*09; 1/'17    a. Dr. Ellyn Hack; '03 - Cypher DES 3.0 mm 33 mm proximal-mid LAD (details D1 with ostial 60%); 3/'09 dRCA 2.75 mm x 13 mm Cypher DES (2.8 mm); 7/'09 pRCA 3.0 mm x 18 m Cypher DES (3.25 mm);; (2009) 2D Echo - EF 55%, (November 2014) nonischemic Myoview;; b. Promus DES to RCA and mid LAD 02/26/2015   Chronotropic incompetence 12/2011   ETT 11/2017: Normal blood pressure response.  No EKG changes.  Exercise stopped due to fatigue and dyspnea.  Heart rate increased to low 90s from 50s.  Suggest chronotropic incompetence.  Severely impaired exercise capacity. -->  Results reviewed with electrophysiology: Still not indication for pacemaker.   Diabetes mellitus type 2 in obese [facility])    CAD   Diverticulosis    Dyslipidemia, goal LDL below 70    on [employer] study medicaion.   ED (erectile dysfunction)    GERD (gastroesophageal reflux  disease)    Hemorrhoids 07/2002   Internal and External   Insomnia    Nodular basal cell carcinoma (BCC) 05/08/2019   left ear (cx27fu)   Non-STEMI (non-ST elevated myocardial infarction) ([place]) 11/2001   a. Proximal LAD tandem lesions -- long DES stent covering both;; b. Jan 2017: mRCA PCI, mLAD PCI   Parkinson's disease [facility])    sees Dr. Ellouise Newer   Persistent sinus bradycardia    Personal history of colonic adenomas 09/06/2012   Psoriasis    Resting HR in 54s   SCC (squamous cell carcinoma) 11/13/2001   right upper outer arm (txpbx)   SCC (squamous cell carcinoma) 11/13/2009   right upper ear rim (cx3 95fu)   SCC (squamous cell carcinoma) 01/20/2010   right nose (cx60fu)   SCC (squamous cell carcinoma) 03/28/2014   right side of nose ( cx77fu)   SCC (squamous cell carcinoma) 06/03/2016   right side of nose   SCC (squamous cell carcinoma) 05/08/2019   right ear inferior (cx59fu) cis   Spasm of esophagus    Squamous cell carcinoma in situ (SCCIS) 05/08/2019   Right Ear Inferior   Squamous cell carcinoma of skin 11/08/2001   top right rim ear    SVT (supraventricular tachycardia) ([place]) 2003   Vertigo, benign positional     PAST SURGICAL HISTORY   Past Surgical History:  Procedure Laterality Date   CARDIAC CATHETERIZATION N/A 02/26/2015   Procedure: Left Heart Cath and Coronary Angiography;  Surgeon: Leonie Man, MD: mRCA 99% --> PCI. mLAD 75%-> FFR Guided PCI, Mod ISR in pLAD & pRCA   CARDIAC CATHETERIZATION N/A 02/26/2015   Procedure: Coronary Stent Intervention;  Surgeon: Leonie Man, MD;  Location: [facility] INVASIVE CV LAB: mRCA Promus Premier DES 3.0 x 12 (3.5), mLAD Promus Premier DES 2.75 x 20 (3.0)   CARDIAC CATHETERIZATION N/A 02/26/2015   Procedure: Intravascular Pressure Wire/FFR Study;  Surgeon: Leonie Man, MD;  Location: [place] CV LAB;  Service: Cardiovascular: LAD 75% - FFR + -> PCI   CATARACT EXTRACTION  [id number],[id number]   COLONOSCOPY  12/16/2017   per Dr.  Carlean Purl, adenomatous polyps, no repeats due to age    [place]  LAD - 2003, RCA 3 & 7/ '09   Cypher 3.0 x 32 mid LAD; Cyper 2.75 x 13 - distal RCA, 3.0  x 18 Prox RCA   ESOPHAGOGASTRODUODENOSCOPY  08-11-02   esophageal dilation per Dr. Carlean Purl   HERNIA REPAIR     right inguinal    NM MYOVIEW LTD  Nov 2014   ~8 METS; EF 60%, no ischemia or infarction   PERCUTANEOUS CORONARY STENT INTERVENTION (PCI-S)  11/23/2001   NSTEMI: Prox-Mid LAD tandem ~80% lesions on either side of D1 (with 80% lesion) -- Cypher DES 3.0 mm x 33 m (covering both lesions)    PERCUTANEOUS CORONARY STENT INTERVENTION (PCI-S)  04/27/2007   Unstable Angina: Distal RCA 95%: Cypher 2.75x13  (2.8 mm); residual focal ~60%ISR in LAD stent, 60% D1 ostial (no PTCA on LAD due to no ischemia on ST)   PERCUTANEOUS CORONARY STENT INTERVENTION (PCI-S)  09/21/2007   Bradycardia & Unstable Angina: Prox RCA 70% - PCI Cypher DES 3.0 mm x 18 mm  (3.25 mm); ostial 60-70% jailed D1. LAD & distal RCA stents patent   SHOULDER SURGERY     left rotator cuff, Dr. Gladstone Lighter   TRANSTHORACIC ECHOCARDIOGRAM  March 2009   Normal LV size and function, EF 55%. Mild MR; mild RV dilation.   TRANSTHORACIC ECHOCARDIOGRAM  02/2015   EF 60-65%. Moderate concentric LVH. Normal function with normal regional wall motion. GR 1 DD   WRIST FRACTURE SURGERY     right    Immunization History  Administered Date(s) Administered   Fluad Quad(high Dose 65+) 11/04/2018, 11/07/2019   Influenza Split 11/27/2010   Influenza, High Dose Seasonal PF 12/18/2013, 01/02/2015, 12/26/2016, 12/19/2017, 12/18/2020   Influenza-Unspecified 12/24/2012, 12/18/2013, 12/25/2014   PFIZER(Purple Top)SARS-COV-2 Vaccination 03/16/2019, 04/06/2019, 10/14/2019, 09/08/2020   Pfizer Covid-19 Vaccine Bivalent Booster 66yrs & up 12/25/2020   Pneumococcal Conjugate-13 08/16/2015   Pneumococcal Polysaccharide-23 05/01/2009   Zoster Recombinat (Shingrix) 12/19/2017,  02/18/2018    MEDICATIONS/ALLERGIES   Current Meds  Medication Sig   aspirin EC 81 MG tablet Take 81 mg by mouth daily.   CINNAMON PO Take 1,000 mg by mouth daily.   fish oil-omega-3 fatty acids 1000 MG capsule Take 1 g by mouth daily.   glipiZIDE (GLUCOTROL) 5 MG tablet TAKE 1 TABLET BY MOUTH TWICE DAILY BEFORE A MEAL   Lancets (BD LANCET ULTRAFINE 30G) MISC USE ONE  TO CHECK GLUCOSE ONCE DAILY   MAGNESIUM CHLORIDE PO Take 250 mg by mouth daily.   memantine (NAMENDA) 10 MG tablet 1 TABLET BY MOUTH TWICE DAILY   nitroGLYCERIN (NITROSTAT) 0.4 MG SL tablet PLACE 1 TABLET UNDER THE TONGUE EVERY 5 MINUTES FOR 3 DOSES AS NEEDED FOR CHEST PAIN   ONETOUCH ULTRA test strip USE TO TEST DAILY   POTASSIUM GLUCONATE PO Take 99 mg by mouth daily.   [DISCONTINUED] carbidopa-levodopa (SINEMET IR) 25-100 MG tablet TAKE 1 TABLET BY MOUTH THREE TIMES DAILY WITH MEALS   [DISCONTINUED] Cholecalciferol (VITAMIN D3) 5000 UNITS CAPS Take 5,000 Units by mouth daily.   [place] 420 MG/3.5ML SOCT INJECT 420 MG INTO THE SKIN EVERY 30 DAYS    Allergies  Allergen Reactions   Beta Adrenergic Blockers Other (See Comments)    chronotropic incompetence   Praluent [Alirocumab]     Diffuse Rash - improved when stopping & taking Benadryl   Sulfonamide Derivatives     unknown   Statins Other (See Comments)    REACTION: myalgias    SOCIAL HISTORY/FAMILY HISTORY   Reviewed in Epic:  Pertinent findings:  Social History   Tobacco Use   Smoking status: Every Day    Types: Cigars  Start date: 07/06/1952   Smokeless tobacco: Never   Tobacco comments:    1-2 per day; he says "I really don't inhale"  Vaping Use   Vaping Use: Never used  Substance Use Topics   Alcohol use: Yes    Alcohol/week: 0.0 standard drinks    Comment: occ   Drug use: No   Social History   Social History Narrative   He is a married, father of 70, grandfather 3.   He still smokes 2-3 cigars per day.  States that he "does not really inhale ". He is not really all that it's including, stating that he wants to have his 1 remaining vice.   Exercises only on occasion.   He does various landscaping jobs including cutting wood, and clearing brush.      Pt is right handed   Lives in 2 story home with his wife   High school graduate   Retired Dealer    OBJCTIVE -PE, EKG, labs   Wt Readings from Last 3 Encounters:  01/06/21 211 lb 9.6 oz (96 kg)  11/07/20 207 lb (93.9 kg)  09/04/20 215 lb (97.5 kg)    Physical Exam: BP 134/86 (BP Location: Right Arm, Patient Position: Sitting, Cuff Size: Normal)   Pulse (!) 50   Ht 5\' 9"  (1.753 m)   Wt 211 lb 9.6 oz (96 kg)   SpO2 97%   BMI 31.25 kg/m  Physical Exam Vitals reviewed.  Constitutional:      Appearance: Normal appearance.  HENT:     Head: Normocephalic and atraumatic.     Ears:     Comments: Very hard of hearing Eyes:     Extraocular Movements: Extraocular movements intact.  Neck:     Vascular: No carotid bruit or JVD.     Comments: Somewhat stiff overall, but less neck mobility Cardiovascular:     Rate and Rhythm: Regular rhythm. Bradycardia present. Occasional Extrasystoles are present.    Chest Wall: PMI is not displaced.     Pulses: Intact distal pulses. Decreased pulses (Diminished, but palpable.).     Heart sounds: S1 normal and S2 normal.    No friction rub. Gallop present. No S4 (Cannot rule out subjective or) sounds.  Pulmonary:     Effort: Pulmonary effort is normal. No respiratory distress.     Breath sounds: Normal breath sounds. No wheezing, rhonchi or rales.  Chest:     Chest wall: No tenderness.  Musculoskeletal:        General: Swelling (Trivial) present. No tenderness.     Cervical back: Full passive range of motion without pain.  Skin:    General: Skin is warm and dry.     Coloration: Skin is pale.  Neurological:     General: No focal deficit present.     Mental Status: He is alert and oriented to  person, place, and time.     Cranial Nerves: No cranial nerve deficit.     Comments: Somewhat confused; relatively poor historian.  Slow and steady speech.  Probably just does not answer questions fast as opposed to having significant memory loss.  Psychiatric:        Mood and Affect: Mood normal.        Behavior: Behavior normal.        Thought Content: Thought content normal.        Judgment: Judgment normal.     Comments: Overall slow response.  No real change in baseline.     Adult  ECG Report  Rate: 50 ;  Rhythm: sinus bradycardia, premature atrial contractions (PAC), premature ventricular contractions (PVC), and potential fusion beats. ;   Narrative Interpretation: stable S [place] with Ectopy  Recent Labs:  reviewed  Lab Results  Component Value Date   CHOL 139 11/07/2020   HDL 56.40 11/07/2020   LDLCALC 51 11/07/2020   LDLDIRECT 85.5 06/27/2012   TRIG 158.0 (H) 11/07/2020   CHOLHDL 2 11/07/2020   Lab Results  Component Value Date   CREATININE 1.46 11/07/2020   BUN 21 11/07/2020   NA 137 11/07/2020   K 4.7 11/07/2020   CL 101 11/07/2020   CO2 27 11/07/2020   CBC Latest Ref Rng & Units 11/07/2020 11/07/2019 11/09/2018  WBC 4.0 - 10.5 K/uL 7.2 7.2 6.1  Hemoglobin 13.0 - 17.0 g/dL 15.1 14.8 14.7  Hematocrit 39.0 - 52.0 % 45.5 44.4 43.9  Platelets 150.0 - 400.0 K/uL 199.0 204 191.0    Lab Results  Component Value Date   HGBA1C 6.9 (H) 11/07/2020   Lab Results  Component Value Date   TSH 2.58 11/07/2020    ==================================================  COVID-19 Education: The signs and symptoms of COVID-19 were discussed with the patient and how to seek care for testing (follow up with PCP or arrange E-visit).    I spent a total of 22 minutes with the patient spent in direct patient consultation.  Additional time spent with chart review  / charting (studies, outside notes, etc): 14 min Total Time: 36 min  Current medicines are reviewed at length with the  patient today.  (+/- concerns) none  This visit occurred during the SARS-CoV-2 public health emergency.  Safety protocols were in place, including screening questions prior to the visit, additional usage of staff PPE, and extensive cleaning of exam room while observing appropriate contact time as indicated for disinfecting solutions.  Notice: This dictation was prepared with Dragon dictation along with smart phrase technology. Any transcriptional errors that result from this process are unintentional and may not be corrected upon review.  Patient Instructions / Medication Changes & Studies & Tests Ordered   Patient Instructions  Medication Instructions:  NO CHANGES  *If you need a refill on your cardiac medications before your next appointment, please call your pharmacy*   Lab Work: NOT NEEDED   Testing/Procedures: NOT NEEDED   Follow-Up: At [facility], you and your health needs are our priority.  As part of our continuing mission to provide you with exceptional heart care, we have created designated Provider Care Teams.  These Care Teams include your primary Cardiologist (physician) and Advanced Practice Providers (APPs -  Physician Assistants and Nurse Practitioners) who all work together to provide you with the care you need, when you need it.  We recommend signing up for the patient portal called "MyChart".  Sign up information is provided on this After Visit Summary.  MyChart is used to connect with patients for Virtual Visits (Telemedicine).  Patients are able to view lab/test results, encounter notes, upcoming appointments, etc.  Non-urgent messages can be sent to your provider as well.   To learn Murray about what you can do with MyChart, go to [email or web address].    Your next appointment:   12 month(s)  The format for your next appointment:   In Person  Provider:   Glenetta Hew, MD     Other Instructions STAY ACTIVE   Studies Ordered:   Orders Placed This  Encounter  Procedures   EKG 12-Lead  Glenetta Hew, M.D., M.S. Interventional Cardiologist   Pager # [phone] Phone # [phone] [address] [place], [place] [address]   Thank you for choosing Heartcare at [facility]!!

## 2021-01-07 ENCOUNTER — Ambulatory Visit (INDEPENDENT_AMBULATORY_CARE_PROVIDER_SITE_OTHER): Payer: Medicare HMO | Admitting: Pharmacist

## 2021-01-07 DIAGNOSIS — N401 Enlarged prostate with lower urinary tract symptoms: Secondary | ICD-10-CM

## 2021-01-07 DIAGNOSIS — N138 Other obstructive and reflux uropathy: Secondary | ICD-10-CM

## 2021-01-07 DIAGNOSIS — E785 Hyperlipidemia, unspecified: Secondary | ICD-10-CM

## 2021-01-07 NOTE — Progress Notes (Signed)
Chronic Care Management Pharmacy Note  01/22/2021 Name:  Joe Murray MRN:  [id number] DOB:  1940/05/15  Summary: BP not ideally at goal < 140/90 per home readings LDL at goal < 70  Recommendations/Changes made from today's visit: -Recommended monitoring BP at home at least once weekly -Consider application for [employer] if Repatha is not affordable -Scheduled repeat vitamin D level lab appt  Plan: BP assessment in 1 month   Subjective: Joe Murray is an 80 y.o. year old male who is a primary patient of Joe Morale, MD.  The CCM team was consulted for assistance with disease management and care coordination needs.    Engaged with patient face to face for initial visit in response to provider referral for pharmacy case management and/or care coordination services.   Consent to Services:  The patient was given the following information about Chronic Care Management services today, agreed to services, and gave verbal consent: 1. CCM service includes personalized support from designated clinical staff supervised by the primary care provider, including individualized plan of care and coordination with other care providers 2. 24/7 contact phone numbers for assistance for urgent and routine care needs. 3. Service will only be billed when office clinical staff spend 20 minutes or more in a month to coordinate care. 4. Only one practitioner may furnish and bill the service in a calendar month. 5.The patient may stop CCM services at any time (effective at the end of the month) by phone call to the office staff. 6. The patient will be responsible for cost sharing (co-pay) of up to 20% of the service fee (after annual deductible is met). Patient agreed to services and consent obtained.  Patient Care Team: Joe Morale, MD as PCP - General Ellyn Hack Leonie Green, MD as PCP - Cardiology (Cardiology) Cameron Sprang, MD as Consulting Physician (Neurology) Lavonna Monarch, MD as  Consulting Physician (Dermatology) Viona Gilmore, [facility] as Pharmacist (Pharmacist)  Recent office visits: 11/07/2020 Alysia Penna MD (PCP) - Patient was seen for Essential hypertension and additional issues. No medication changes.  No follow up noted.   07/05/2020 Alysia Penna MD (PCP) - Patient was seen for Ingrown toenail of both feet. Referral to Podiatry. No medication changes. No follow up noted.   03/26/2020 Ofilia Neas LPN - Medicare annual wellness exam.  Recent consult visits: 11/13/2020 Marilynne Halsted, MD (Eye center) - Patient was seen for follow up corneal transplant. Started Pred Forte 1% once per day and Systane Ultra 3-4 times daily. No follow up noted. Follow up in 6 months.   09/04/2020 Ellouise Newer MD (neurology) - Patient was seen for Alzheimers demential and additional issue. No medication changes.    07/10/2020 Ila Mcgill DPM (podiatry) - Patient was seen for Pain due to onychomycosis and an additional issue. No medication changes. No follow up noted.  Hospital visits: None in previous 6 months   Objective:  Lab Results  Component Value Date   CREATININE 1.46 11/07/2020   BUN 21 11/07/2020   GFR 45.06 (L) 11/07/2020   GFRNONAA 55 (L) 06/01/2019   GFRAA 64 06/01/2019   NA 137 11/07/2020   K 4.7 11/07/2020   CALCIUM 9.7 11/07/2020   CO2 27 11/07/2020   GLUCOSE 148 (H) 11/07/2020    Lab Results  Component Value Date/Time   HGBA1C 6.9 (H) 11/07/2020 10:15 AM   HGBA1C 6.5 (H) 11/07/2019 10:48 AM   GFR 45.06 (L) 11/07/2020 10:15 AM   GFR 50.15 (L) 11/09/2018  08:20 AM   MICROALBUR 1.8 08/09/2015 09:07 AM   MICROALBUR 0.7 06/27/2012 09:45 AM    Last diabetic Eye exam: No results found for: HMDIABEYEEXA  Last diabetic Foot exam: No results found for: HMDIABFOOTEX   Lab Results  Component Value Date   CHOL 139 11/07/2020   HDL 56.40 11/07/2020   LDLCALC 51 11/07/2020   LDLDIRECT 85.5 06/27/2012   TRIG 158.0 (H) 11/07/2020   CHOLHDL 2  11/07/2020    Hepatic Function Latest Ref Rng & Units 11/07/2020 11/07/2019 06/01/2019  Total Protein 6.0 - 8.3 g/dL 7.1 6.6 6.6  Albumin 3.5 - 5.2 g/dL 4.4 - 4.4  AST 0 - 37 U/L _0 ALT 0 - 53 U/L _1 Alk Phosphatase 39 - 117 U/L 55 - 55  Total Bilirubin 0.2 - 1.2 mg/dL 0.6 0.5 0.3  Bilirubin, Direct 0.0 - 0.3 mg/dL 0.1 0.1 -    Lab Results  Component Value Date/Time   TSH 2.58 11/07/2020 10:15 AM   TSH 1.57 11/07/2019 10:48 AM    CBC Latest Ref Rng & Units 11/07/2020 11/07/2019 11/09/2018  WBC 4.0 - 10.5 K/uL 7.2 7.2 6.1  Hemoglobin 13.0 - 17.0 g/dL 15.1 14.8 14.7  Hematocrit 39.0 - 52.0 % 45.5 44.4 43.9  Platelets 150.0 - 400.0 K/uL 199.0 204 191.0    Lab Results  Component Value Date/Time   VD25OH >120 11/07/2020 10:15 AM   VD25OH 107 (H) 11/07/2019 10:48 AM   VD25OH 110.84 ([place]) 11/09/2018 08:20 AM    Clinical ASCVD: Yes  The ASCVD Risk score (Arnett DK, et al., 2019) failed to calculate for the following reasons:   The 2019 ASCVD risk score is only valid for ages 73 to 79   The patient has a prior MI or stroke diagnosis    Depression screen [facility] 2/9 03/26/2020 08/27/2017 08/16/2015  Decreased Interest 0 0 0  Down, Depressed, Hopeless 0 0 0  PHQ - 2 Score 0 0 0      Social History   Tobacco Use  Smoking Status Every Day   Types: Cigars   Start date: 07/06/1952  Smokeless Tobacco Never  Tobacco Comments   1-2 per day; he says "I really don't inhale"   BP Readings from Last 3 Encounters:  01/06/21 134/86  11/07/20 120/80  09/04/20 (!) 143/81   Pulse Readings from Last 3 Encounters:  01/06/21 (!) 50  11/07/20 (!) 48  09/04/20 (!) 55   Wt Readings from Last 3 Encounters:  01/06/21 211 lb 9.6 oz (96 kg)  11/07/20 207 lb (93.9 kg)  09/04/20 215 lb (97.5 kg)   BMI Readings from Last 3 Encounters:  01/06/21 31.25 kg/m  11/07/20 31.02 kg/m  09/04/20 32.22 kg/m    Assessment/Interventions: Review of patient past medical history, allergies,  medications, health status, including review of consultants reports, laboratory and other test data, was performed as part of comprehensive evaluation and provision of chronic care management services.   SDOH:  (Social Determinants of Health) assessments and interventions performed: Yes SDOH Interventions    Flowsheet Row Most Recent Value  SDOH Interventions   Financial Strain Interventions Intervention Not Indicated  Transportation Interventions Intervention Not Indicated      SDOH Screenings   Alcohol Screen: Low Risk    Last Alcohol Screening Score (AUDIT): 1  Depression (PHQ2-9): Low Risk    PHQ-2 Score: 0  Financial Resource Strain: Low Risk    Difficulty of Paying Living Expenses: Not very hard  Food Insecurity: No Food Insecurity   Worried About Charity fundraiser in the Last Year: Never true   Ran Out of Food in the Last Year: Never true  Housing: Low Risk    Last Housing Risk Score: 0  Physical Activity: Inactive   Days of Exercise per Week: 0 days   Minutes of Exercise per Session: 0 min  Social Connections: Moderately Integrated   Frequency of Communication with Friends and Family: Once a week   Frequency of Social Gatherings with Friends and Family: Twice a week   Attends Religious Services: 1 to 4 times per year   Active Member of [employer] or Organizations: No   Attends Music therapist: Never   Marital Status: Married  Stress: No Stress Concern Present   Feeling of Stress : Not at all  Tobacco Use: High Risk   Smoking Tobacco Use: Every Day   Smokeless Tobacco Use: Never   Passive Exposure: Not on file  Transportation Needs: No Transportation Needs   Lack of Transportation (Medical): No   Lack of Transportation (Non-Medical): No   Yes, he is sleeping a lot and his wife feels it may be coming from carbidopa-levodopa or memantine as this is when it started.  She mentioned that Dr. Sarajane Jews had him cut the dose of carbidopa-lovodopa in half, she did that  for a little while but it did not make any difference.   Yes, patient does check his blood sugars daily, his wife is unsure of the readings but believes they are good running around 115. Patients wife checks his blood pressures on occasion, she doesn't recall the actual readings.  Patient will eat for breakfast a sausage and egg biscuit and honeydew most days and on occasion will eat a packet of oatmeal.  For lunch he will have a protein bar. For dinner he will have a meat and vegetable or soup, it varies depending on what his wife fixes for dinner.  Patients wife also mentions that he sometimes will forget to take his medications, he currently has about a week of Glipizide and will be calling [facility] today to have this filled.  No, however, Repatha has gone from 100/mo to 163/mo.  Patient reports he has been sleeping a lot over the last month or so and thinks this is due to his Parkinson's medications. He wakes up a couple times during the night and only has problems with sleep a couple times a year usually. He reports he is resting pretty well. He does not snore very much.  For exercise, patient uses the cubii about once a week and then is just walking around the house. He often forgets to use the [place] but could use it more often.  Patient still smokes about 1-2 cigars a day and has been gradually cutting back on them as he was previously smoking 5-6 per day. He is not motivated to give it up and has been smoking cigarettes since he was 11.  Patient reports his biggest concern with medications is with the price of the Repatha. It went up this past month and he wasn't sure why. He reports he never paid $47 per month for it.  CCM Care Plan  Allergies  Allergen Reactions   Beta Adrenergic Blockers Other (See Comments)    chronotropic incompetence   Praluent [Alirocumab]     Diffuse Rash - improved when stopping & taking Benadryl   Sulfonamide Derivatives     unknown   Statins Other (See  Comments)    REACTION: myalgias    Medications Reviewed Today     Reviewed by Angelena Form, CMA (Certified Medical Assistant) on 01/06/21 at [phone]  Med List Status: <None>   Medication Order Taking? Sig Documenting Provider Last Dose Status Informant  aspirin EC 81 MG tablet [id number] Yes Take 81 mg by mouth daily. [provider] Taking Active   carbidopa-levodopa (SINEMET IR) 25-100 MG tablet [id number] Yes TAKE 1 TABLET BY MOUTH THREE TIMES DAILY WITH MEALS Cameron Sprang, MD Taking Active   Cholecalciferol (VITAMIN D3) 5000 UNITS CAPS [id number] Yes Take 5,000 Units by mouth daily. Joe Morale, MD Taking Active Spouse/Significant Other  Cinnamon 500 MG capsule [id number] Yes Take 2,000 mg by mouth daily. [provider] Taking Active Spouse/Significant Other  fish oil-omega-3 fatty acids 1000 MG capsule [id number] Yes Take 2 g by mouth daily. [provider] Taking Active Spouse/Significant Other  glipiZIDE (GLUCOTROL) 5 MG tablet [id number] Yes TAKE 1 TABLET BY MOUTH TWICE DAILY BEFORE A MEAL Joe Morale, MD Taking Active   Lancets (BD LANCET ULTRAFINE 30G) MISC [id number] Yes USE ONE  TO CHECK GLUCOSE ONCE DAILY Joe Morale, MD Taking Active Spouse/Significant Other  MAGNESIUM CHLORIDE PO [id number] Yes Take 1 tablet by mouth daily. [provider] Taking Active Spouse/Significant Other  memantine (NAMENDA) 10 MG tablet [id number] Yes 1 TABLET BY MOUTH TWICE DAILY Cameron Sprang, MD Taking Active   nitroGLYCERIN (NITROSTAT) 0.4 MG SL tablet [id number] Yes PLACE 1 TABLET UNDER THE TONGUE EVERY 5 MINUTES FOR 3 DOSES AS NEEDED FOR CHEST PAIN Leonie Man, MD Taking Active   [facility] ULTRA test strip [id number] Yes USE TO TEST DAILY Joe Morale, MD Taking Active   potassium gluconate 595 MG TABS tablet [id number] Yes Take 595 mg by mouth. [provider] Taking Active Spouse/Significant Other  [place] MG/3.5ML SOCT  [id number] Yes INJECT 29 MG INTO THE SKIN EVERY 30 DAYS Leonie Man, MD Taking Active             Patient Active Problem List   Diagnosis Date Noted   Parkinson's disease ([place]) 11/07/2020   Alzheimer's dementia ([place]) 11/07/2020   Intermittent claudication ([place]) 12/01/2017   Trochanteric bursitis of right hip 09/04/2017   Fuchs' corneal dystrophy 08/14/2016   Essential hypertension -> allowing permissive hypertension.  Medication stopped because of fatigue and orthostatic dizziness. 03/05/2015   NSTEMI (non-ST elevated myocardial infarction) [facility])    Coronary artery disease involving native coronary artery of native heart without angina pectoris    Vitamin D deficiency 07/04/2014   Fatigue 01/08/2014   Obesity (BMI 30-39.9) 01/12/2013   DM (diabetes mellitus), type 2 with complications - CAD    Dyslipidemia, goal LDL below 70    Exertional shortness of breath 01/12/2012   Chronotropic incompetence 12/25/2011   Vertigo with mild postural lightheadedness. 08/13/2009   ERECTILE DYSFUNCTION, ORGANIC 05/01/2009   GERD 04/18/2008   BPH with urinary obstruction 07/27/2007   CAD S/P percutaneous coronary angioplasty - LADx2, & RCA x 3 07/12/2001    Class: History of    Immunization History  Administered Date(s) Administered   Fluad Quad(high Dose 65+) 11/04/2018, 11/07/2019   Influenza Split 11/27/2010   Influenza, High Dose Seasonal PF 12/18/2013, 01/02/2015, 12/26/2016, 12/19/2017, 12/18/2020   Influenza-Unspecified 12/24/2012, 12/18/2013, 12/25/2014   PFIZER(Purple Top)SARS-COV-2 Vaccination 03/16/2019, 04/06/2019, 10/14/2019, 09/08/2020   Pfizer Covid-19 Vaccine Bivalent Booster 57yr & up 12/25/2020   Pneumococcal Conjugate-13 08/16/2015  Pneumococcal Polysaccharide-23 05/01/2009   Zoster Recombinat (Shingrix) 12/19/2017, 02/18/2018    Conditions to be addressed/monitored:  Hypertension, Hyperlipidemia, Diabetes, Coronary Artery Disease, and Parkinson's and  Alzheimer's dementia  Care Plan : [place]  Updates made by Viona Gilmore, [place] since 01/22/2021 12:00 AM     Problem: Problem: Hypertension, Hyperlipidemia, Diabetes, Coronary Artery Disease, and Parkinson's and Alzheimer's dementia      Long-Range Goal: Patient-Specific Goal   Start Date: 01/07/2021  Expected End Date: 01/07/2022  This Visit's Progress: On track  Priority: High  Note:   Current Barriers:  Unable to independently monitor therapeutic efficacy  Pharmacist Clinical Goal(s):  Patient will verbalize ability to afford treatment regimen achieve adherence to monitoring guidelines and medication adherence to achieve therapeutic efficacy through collaboration with PharmD and provider.   Interventions: 1:1 collaboration with Joe Morale, MD regarding development and update of comprehensive plan of care as evidenced by provider attestation and co-signature Inter-disciplinary care team collaboration (see longitudinal plan of care) Comprehensive medication review performed; medication list updated in electronic medical record  Hypertension (BP goal <140/90) -Not ideally controlled -Current treatment: No medications -Medications previously tried: n/a  -Current home readings: does not check -Current dietary habits: tries to eat healthy -Current exercise habits: limited -Denies hypotensive/hypertensive symptoms -Educated on BP goals and benefits of medications for prevention of heart attack, stroke and kidney damage; Proper BP monitoring technique; Symptoms of hypotension and importance of maintaining adequate hydration; -Counseled to monitor BP at home weekly, document, and provide log at future appointments -Counseled on diet and exercise extensively  Hyperlipidemia: (LDL goal < 70) -Controlled -Current treatment: Repatha inject 420 mg every 30 days Fish oil 1000 mg twice daily -Medications previously tried: Praluent (rash), statins (myalgias)   -Current dietary patterns: tries to pay attention to his diet -Current exercise habits: not much exercise -Educated on Cholesterol goals;  Importance of limiting foods high in cholesterol; Exercise goal of 150 minutes per week; -Counseled on diet and exercise extensively Recommended to continue current medication  CAD (Goal: prevent events) -Controlled -Current treatment  Aspirin 81 mg 1 tablet daily - had stopped Nitroglycerin 0.4 mg SL 1 tablet as needed -Medications previously tried: none  -Recommended to continue current medication   Diabetes (A1c goal <7%) -Controlled -Current medications: Glipizide 5 mg 1 tablet twice daily before meals -Medications previously tried: none  -Current home glucose readings fasting glucose: 120-140s  post prandial glucose: none -Denies hypoglycemic/hyperglycemic symptoms -Current meal patterns:  breakfast: sausage biscuit, eggs, honey dew and orange juice (trop 50) lunch: protein bar for lunch & candy  dinner: chili, soups; no fried foods - not as many carbs  snacks: m&ms drinks: orange juice, water; crystal lite lemonade; coffee in AM (decaf) - black -Current exercise: limited -Educated on A1c and blood sugar goals; Benefits of routine self-monitoring of blood sugar; Carbohydrate counting and/or plate method -Counseled to check feet daily and get yearly eye exams -Counseled on diet and exercise extensively Recommended to continue current medication Recommended cutting back on carbs with breakfast.  Parkinson's disease (Goal: minimize symptoms) -Controlled -Current treatment  Carbidopa-levodopa 25-100 mg 1 tablet three times daily - mostly taking twice daily -Medications previously tried: none  -Recommended to continue current medication  Memory loss (Goal: prevent rapid worsening of memory) -Controlled -Current treatment  Memantine 10 mg 1 tablet twice daily -Medications previously tried: none  -Recommended to continue  current medication   Health Maintenance -Vaccine gaps: influenza, COVID booster -Current therapy:  Cinnamon  500 mg 1 capsule daily Vitamin B12 Magnesium chloride 1 tablet daily Potassium gluconate 595 mg 1 tablet daily -Educated on Cost vs benefit of each product must be carefully weighed by individual consumer -Patient is satisfied with current therapy and denies issues - Consider stopping cinnamon as they are unsure of the benefit.  [name] Patient will:  - take medications as prescribed as evidenced by patient report and record review check glucose a few times a week, document, and provide at future appointments target a minimum of 150 minutes of moderate intensity exercise weekly engage in dietary modifications by limiting carb intake with breakfast  Follow Up Plan: Telephone follow up appointment with care management team member scheduled for:        Medication Assistance: None required.  Patient affirms current coverage meets needs.  Compliance/Adherence/Medication fill history: Care Gaps: Foot exam, microalbumin, eye exam, COVID booster, influenza Last BP - 120/80 on 11/07/2020 Last A1C - 6.9 on 11/07/2020  Star-Rating Drugs: Glipizide 5 mg - last filled 09/19/2020 [facility]DS at [facility] verified with Katrina  Patient's preferred pharmacy is:  [facility] [address], [place] - [place] [address] [place] [address] Phone: [phone] Fax: [place], [address] [place] [address] Phone: [phone] Fax: [phone]  Uses pill box? Yes Pt endorses 99% compliance - Doesn't want upstream   We discussed: Benefits of medication synchronization, packaging and delivery as well as enhanced pharmacist oversight with Upstream. Patient decided to: Continue current medication management strategy  Care Plan and  Follow Up Patient Decision:  Patient agrees to Care Plan and Follow-up.  Plan: Telephone follow up appointment with care management team member scheduled for:  6 months  Jeni Salles, PharmD, [facility] [place] [phone]

## 2021-01-16 ENCOUNTER — Other Ambulatory Visit: Payer: Self-pay | Admitting: Neurology

## 2021-01-19 ENCOUNTER — Other Ambulatory Visit: Payer: Self-pay | Admitting: Cardiology

## 2021-01-21 NOTE — Telephone Encounter (Signed)
Patient's wife called and said she has been to the pharmacy twice to pick up the patient's carbidopa levodopa 25-100 MG but they don't have it.  She'd like to know what is holding the prescription up?

## 2021-01-22 ENCOUNTER — Other Ambulatory Visit: Payer: Self-pay

## 2021-01-22 DIAGNOSIS — E559 Vitamin D deficiency, unspecified: Secondary | ICD-10-CM

## 2021-01-22 DIAGNOSIS — E119 Type 2 diabetes mellitus without complications: Secondary | ICD-10-CM | POA: Diagnosis not present

## 2021-01-22 DIAGNOSIS — N138 Other obstructive and reflux uropathy: Secondary | ICD-10-CM | POA: Diagnosis not present

## 2021-01-22 DIAGNOSIS — Z7984 Long term (current) use of oral hypoglycemic drugs: Secondary | ICD-10-CM | POA: Diagnosis not present

## 2021-01-22 DIAGNOSIS — N401 Enlarged prostate with lower urinary tract symptoms: Secondary | ICD-10-CM | POA: Diagnosis not present

## 2021-01-22 DIAGNOSIS — E785 Hyperlipidemia, unspecified: Secondary | ICD-10-CM

## 2021-01-22 NOTE — Patient Instructions (Signed)
Hi Joe Murray,  It was great to get to meet you in person! Below is a summary of some of the topics we discussed.   Please reach out to me if you have any questions or need anything before our follow up!  Best, Joe Murray  Joe Murray, PharmD, [facility]   Visit Information   Goals Addressed             This Visit's Progress    Track and Manage My Blood Pressure-Hypertension       Timeframe:  Long-Range Goal Priority:  Medium Start Date:                             Expected End Date:                       Follow Up Date 03/24/21    - check blood pressure weekly - choose a place to take my blood pressure (home, clinic or office, retail store) - write blood pressure results in a log or diary    Why is this important?   You won't feel high blood pressure, but it can still hurt your blood vessels.  High blood pressure can cause heart or kidney problems. It can also cause a stroke.  Making lifestyle changes like losing a little weight or eating less salt will help.  Checking your blood pressure at home and at different times of the day can help to control blood pressure.  If the doctor prescribes medicine remember to take it the way the doctor ordered.  Call the office if you cannot afford the medicine or if there are questions about it.     Notes:        Patient Care Plan: CCM Pharmacy Care Plan     Problem Identified: Problem: Hypertension, Hyperlipidemia, Diabetes, Coronary Artery Disease, and Parkinson's and Alzheimer's dementia      Long-Range Goal: Patient-Specific Goal   Start Date: 01/07/2021  Expected End Date: 01/07/2022  This Visit's Progress: On track  Priority: High  Note:   Current Barriers:  Unable to independently monitor therapeutic efficacy  Pharmacist Clinical Goal(s):  Patient will verbalize ability to afford treatment regimen achieve adherence to monitoring guidelines and medication  adherence to achieve therapeutic efficacy through collaboration with PharmD and provider.   Interventions: 1:1 collaboration with Joe Morale, MD regarding development and update of comprehensive plan of care as evidenced by provider attestation and co-signature Inter-disciplinary care team collaboration (see longitudinal plan of care) Comprehensive medication review performed; medication list updated in electronic medical record  Hypertension (BP goal <140/90) -Not ideally controlled -Current treatment: No medications -Medications previously tried: n/a  -Current home readings: does not check -Current dietary habits: tries to eat healthy -Current exercise habits: limited -Denies hypotensive/hypertensive symptoms -Educated on BP goals and benefits of medications for prevention of heart attack, stroke and kidney damage; Proper BP monitoring technique; Symptoms of hypotension and importance of maintaining adequate hydration; -Counseled to monitor BP at home weekly, document, and provide log at future appointments -Counseled on diet and exercise extensively  Hyperlipidemia: (LDL goal < 70) -Controlled -Current treatment: Repatha inject 420 mg every 30 days Fish oil 1000 mg twice daily -Medications previously tried: Praluent (rash), statins (myalgias)  -Current dietary patterns: tries to pay attention to his diet -Current exercise habits: not much exercise -Educated on Cholesterol goals;  Importance of limiting foods high  in cholesterol; Exercise goal of 150 minutes per week; -Counseled on diet and exercise extensively Recommended to continue current medication  CAD (Goal: prevent events) -Controlled -Current treatment  Aspirin 81 mg 1 tablet daily - had stopped Nitroglycerin 0.4 mg SL 1 tablet as needed -Medications previously tried: none  -Recommended to continue current medication   Diabetes (A1c goal <7%) -Controlled -Current medications: Glipizide 5 mg 1 tablet  twice daily before meals -Medications previously tried: none  -Current home glucose readings fasting glucose: 120-140s  post prandial glucose: none -Denies hypoglycemic/hyperglycemic symptoms -Current meal patterns:  breakfast: sausage biscuit, eggs, honey dew and orange juice (trop 50) lunch: protein bar for lunch & candy  dinner: chili, soups; no fried foods - not as many carbs  snacks: m&ms drinks: orange juice, water; crystal lite lemonade; coffee in AM (decaf) - black -Current exercise: limited -Educated on A1c and blood sugar goals; Benefits of routine self-monitoring of blood sugar; Carbohydrate counting and/or plate method -Counseled to check feet daily and get yearly eye exams -Counseled on diet and exercise extensively Recommended to continue current medication Recommended cutting back on carbs with breakfast.  Parkinson's disease (Goal: minimize symptoms) -Controlled -Current treatment  Carbidopa-levodopa 25-100 mg 1 tablet three times daily - mostly taking twice daily -Medications previously tried: none  -Recommended to continue current medication  Memory loss (Goal: prevent rapid worsening of memory) -Controlled -Current treatment  Memantine 10 mg 1 tablet twice daily -Medications previously tried: none  -Recommended to continue current medication   Health Maintenance -Vaccine gaps: influenza, COVID booster -Current therapy:  Cinnamon 500 mg 1 capsule daily Vitamin B12 Magnesium chloride 1 tablet daily Potassium gluconate 595 mg 1 tablet daily -Educated on Cost vs benefit of each product must be carefully weighed by individual consumer -Patient is satisfied with current therapy and denies issues - Consider stopping cinnamon as they are unsure of the benefit.  [name] Patient will:  - take medications as prescribed as evidenced by patient report and record review check glucose a few times a week, document, and provide at future  appointments target a minimum of 150 minutes of moderate intensity exercise weekly engage in dietary modifications by limiting carb intake with breakfast  Follow Up Plan: Telephone follow up appointment with care management team member scheduled for:      Joe Murray was given information about Chronic Care Management services today including:  CCM service includes personalized support from designated clinical staff supervised by his physician, including individualized plan of care and coordination with other care providers 24/7 contact phone numbers for assistance for urgent and routine care needs. Standard insurance, coinsurance, copays and deductibles apply for chronic care management only during months in which we provide at least 20 minutes of these services. Most insurances cover these services at 100%, however patients may be responsible for any copay, coinsurance and/or deductible if applicable. This service may help you avoid the need for more expensive face-to-face services. Only one practitioner may furnish and bill the service in a calendar month. The patient may stop CCM services at any time (effective at the end of the month) by phone call to the office staff.  Patient agreed to services and verbal consent obtained.   The patient verbalized understanding of instructions, educational materials, and care plan provided today and agreed to receive a mailed copy of patient instructions, educational materials, and care plan.  Telephone follow up appointment with pharmacy team member scheduled for: 6 months  Viona Gilmore, [facility]

## 2021-01-27 DIAGNOSIS — M791 Myalgia, unspecified site: Secondary | ICD-10-CM | POA: Insufficient documentation

## 2021-01-27 DIAGNOSIS — T466X5A Adverse effect of antihyperlipidemic and antiarteriosclerotic drugs, initial encounter: Secondary | ICD-10-CM | POA: Insufficient documentation

## 2021-01-27 NOTE — Assessment & Plan Note (Signed)
Stable.  Doing well.  Only notes may be some fatigue symptoms but no real angina or heart failure.  He is very sedentary and somewhat immobilized by his Parkinson's.   Is on Repatha for lipids, and aspirin monotherapy..  Not on any antihypertensives/antianginals (beta-blocker, ARB/ACE inhibitor Blocker) in order to allow for permissive hypertension.

## 2021-01-27 NOTE — Assessment & Plan Note (Signed)
Very labile blood pressures.  He says at home his pressures remain above 110/70 -170/90.  Today's pressure is probably average for him.  With such dramatic swings in pressures, I would not want to put him on any medication whatsoever.    Provided he does not really have any untoward symptoms of hypertension, would not treat.

## 2021-01-27 NOTE — Assessment & Plan Note (Signed)
More related to deconditioning, and lack of mobility with COVID.  Very poor balance.  He is scared to do gym exercises that are now.

## 2021-01-27 NOTE — Assessment & Plan Note (Signed)
Has not been able to tolerate any statins.  Was started on Repatha.  Lipids well controlled now.

## 2021-01-27 NOTE — Assessment & Plan Note (Signed)
Has difficulty getting his heart rate up.  Avoiding a renal lesions.  I have previously referred him to EP, but not felt to be a PPM candidate as yet.  Follow closely.  If has symptoms of worsening fatigue, will consider sending back to EP.

## 2021-01-27 NOTE — Assessment & Plan Note (Addendum)
Almost 6 years out from last cardiac catheterization, presumably for ACS.  He is pretty active, as active as it could be because of his Parkinson's and unsteady gait.  Unfortunately he is no longer really working out as much.  Not really a true anginal or heart failure symptoms.  Breathing has improved of late. Currently does not have a diuretic listed.

## 2021-01-27 NOTE — Assessment & Plan Note (Signed)
Status post PCI back in 2017.  Has not had any real anginal symptoms since that timeframe.  Remains on aspirin alone.-Plavix stopped because of excess bleeding/bruising.  Okay to hold aspirin 5 to 7 days preop for surgeries or procedures.

## 2021-01-27 NOTE — Assessment & Plan Note (Addendum)
Now is on Repatha.  Most recent A1c is 51 September.  Well-controlled.  Stable.  Not having any myalgia issues or significant arthralgia issues.  Memory issues seem to have improved a little bit Statin, but now he is having memory loss from dementia and Parkinson's.

## 2021-02-10 ENCOUNTER — Other Ambulatory Visit (INDEPENDENT_AMBULATORY_CARE_PROVIDER_SITE_OTHER): Payer: Medicare HMO

## 2021-02-10 ENCOUNTER — Telehealth: Payer: Self-pay | Admitting: Family Medicine

## 2021-02-10 ENCOUNTER — Telehealth: Payer: Self-pay

## 2021-02-10 ENCOUNTER — Other Ambulatory Visit: Payer: Self-pay

## 2021-02-10 DIAGNOSIS — E559 Vitamin D deficiency, unspecified: Secondary | ICD-10-CM

## 2021-02-10 DIAGNOSIS — E673 Hypervitaminosis D: Secondary | ICD-10-CM

## 2021-02-10 LAB — VITAMIN D 25 HYDROXY (VIT D DEFICIENCY, FRACTURES): VITD: 109.14 ng/mL (ref 30.00–100.00)

## 2021-02-10 NOTE — Telephone Encounter (Signed)
I addressed this in the Result Note

## 2021-02-10 NOTE — Telephone Encounter (Signed)
Patients spouse called back returning Wendie Simmer calls. They are requesting a phone call back.  Patient could be contacted at [phone].  Please advise.

## 2021-02-10 NOTE — Telephone Encounter (Signed)
Message sent to Dr Fry for advise 

## 2021-02-11 ENCOUNTER — Other Ambulatory Visit: Payer: Self-pay | Admitting: Neurology

## 2021-02-11 NOTE — Telephone Encounter (Signed)
Spoke with spouse on 02/10/21.   See lab results.  Message complete

## 2021-03-11 ENCOUNTER — Ambulatory Visit: Payer: Medicare HMO | Admitting: Physician Assistant

## 2021-03-11 ENCOUNTER — Encounter: Payer: Self-pay | Admitting: Physician Assistant

## 2021-03-11 ENCOUNTER — Other Ambulatory Visit: Payer: Self-pay

## 2021-03-11 VITALS — BP 124/82 | HR 72 | Resp 18 | Ht 69.0 in | Wt 213.0 lb

## 2021-03-11 DIAGNOSIS — F02818 Dementia in other diseases classified elsewhere, unspecified severity, with other behavioral disturbance: Secondary | ICD-10-CM | POA: Diagnosis not present

## 2021-03-11 DIAGNOSIS — G20 Parkinson's disease: Secondary | ICD-10-CM | POA: Diagnosis not present

## 2021-03-11 DIAGNOSIS — G301 Alzheimer's disease with late onset: Secondary | ICD-10-CM

## 2021-03-11 DIAGNOSIS — R69 Illness, unspecified: Secondary | ICD-10-CM | POA: Diagnosis not present

## 2021-03-11 MED ORDER — MEMANTINE HCL 10 MG PO TABS: ORAL_TABLET | ORAL | 11 refills | Status: DC

## 2021-03-11 MED ORDER — CARBIDOPA-LEVODOPA 25-100 MG PO TABS: 1.0000 | ORAL_TABLET | Freq: Two times a day (BID) | ORAL | 11 refills | Status: DC

## 2021-03-11 NOTE — Progress Notes (Signed)
Assessment/Plan:    Alzheimer's Dementia, late Onset with Behavioral Disturbance   Discussed the diagnosis of Alzheimer's Dementia likely possible Lewy Body component Discussed safety both in and out of the home.  Discussed the importance of regular daily schedule with inclusion of crossword puzzles to maintain brain function.  Continue to monitor mood. Naps should be scheduled and should be no longer than 60 minutes and should not occur after 2 PM.  Continue Mediterranean diet is recommended  Monitor driving, recommend wife in the car when using the car. Continue Memantine 10 mg twice daily.Side effects were discussed  Reduce Sinemet 25/100 mg  to 1 tablet lunchtime and  1 tablet at supper  ( he has been taking it as such with therapeutic results) Referral to PT  Follow up in 6  months.   Case discussed with Dr. Delice Lesch who agrees with the plan     Subjective:    ED visits: none Hospital admissions: none   Joe Murray  is a 81 y.o.male with a history of hypertension, hyperlipidemia, diabetes, CAD, seen today in follow-up for Alzheimer's Dementia, late Onset with Behavioral Disturbance, with possible Lewy body dementia component. This patient is accompanied in the office by his wife Joe Murray who supplements the history.  Previous records as well as any outside records available were reviewed prior to todays visit.  Patient is currently on Memantine 10 mg bid tolerating well.  For tremors, he is on Sinemet 25/100, 2 times daily (was prescribed 2 times daily but wife changed it to Bid).  The patient feels that his memory is the same.  Joe Murray reports that his memory has not gotten worse, although he cannot remember their names of people that he knows greater than 60 years.   He denies leaving objects in unusual places. No hallucinations or paranoia have been reported.  No depression, or mood changes.  He sleeps "pretty good, sleeps too much " without  REM behavior. He denies vivid  dreams, or sleepwalking. He is not interested in doing any activities, does not like to walk or adult day programs.   He is bathing and dressing with assistance from wife. His wife is in charge of the medications and finances His appetite is good, wife states that he craves sweets.  He had periods of swallowing difficulties, as he attempts to eat big bites of food.  No sialorrhea is reported.  His voice is softer.  He cooks occasionally an egg, with the wife supervision.  He has not left the stove on.   He ambulates with a cane only when he goes out of the house otherwise he does not use any support "he does not like walkers". He also uses a lift chair.  No further falls but he walks very cautiously, with wide steps. He no longer drives. He denies headaches, double vision, dizziness, focal numbness or tingling. His tremors are well controlled with Sinemet, which he takes around  12 and then at 11 PM.  He denies any urine incontinence or retention, constipation or diarrhea.     History on Initial Assessment 04/06/2018: This is a 81 year old right-handed man with a history of hypertension, hyperlipidemia, CAD, diabetes, presenting for evaluation of memory loss. He reports his memory is lousy. He started noticing memory changes around a year ago. His wife feels memory changes started a few years ago which she attributes to statin use, however in the past year, forgetfulness has worsened. He watches a lot of Westerns but  would watch the same show 1-2 weeks later, his wife reminds him he has seen it already but he denies it. He forgets conversations. He does not remember people from the past, someone would pass away and he would not remember then. No significant difficulties following directions/instructions. His wife has not noticed significant memory decline in the past year. He drives minimally due to vision issues, and denies getting lost. His wife manages finances. He was previously on several medications that  were stopped 2 months ago due to fatigue and memory concerns. He is only taking glipizide. He had muscle pains on statin in the past, his wife feels the Repatha helped improve his cholesterol levels but also caused leg pain. He reports right leg pain mostly after walking a few steps, improving when he rests. His wife has noticed slowed movements, he would be very slow when opening things. He has a little trouble getting out of a chair and feels his balance is off when he first stands, but feels fine once he starts walking. His wife  has noticed over the past year that gait has slowed down and his stride has been shorter. He speaks in a low voice and does not move his mouth much when talking, his wife reports he has always been this way. No major personality changes, no paranoia or hallucinations. No family history of dementia, no history of significant head injuries. He drinks alcohol once a week.   He has had headaches over the past year localized over the back of his left ear. He describes a throbbing pain that only occurs in the evening around 30-40 minutes after he sits in his recliner. It would start wearing off after a few hours and resolves by bedtime. He takes Tylenol every evening. No associated nausea/vomiting, photo/phonophobia. He has trouble seeing the words on TV and reports monocular diplopia in his right eye. He denies any dizziness, dysarthria/dysphagia, neck/back pain, focal numbness/tingling, bowel/bladder dysfunction, anosmia, no falls. Sleep is good, he has some daytime drowsiness and snores minimally. No REM behavior disorder noted by wife. He has had occasional left hand tremors the past year, he denies any difficulty writing with his right hand or using utensils. He is a retired Systems developer.   Diagnostic Data: TSH normal, B12 low normal 312. MRI brain without contrast done 03/2018 did not show any acute changes. There was mild to moderate diffuse atrophy and chronic microvascular  disease.     Neurocognitive Exam 02/21/20 Dr. Nicole Kindred "Joe Murray is thus manifesting what appears to be a clinical dementia syndrome, currently at a mild level of progression. My sense is that Alzheimer's is most likely the primary problem given his demographics, the high base rate of the disorder, and his memory impairment. His Parkinsonism and possible other signs/symptoms (RBD/hallucinations) suggest that there may also be a Lewy Body Spectrum Component. A more minor vascular contributor is possible but that is unlikely a sole cause. He is already on Namenda. Would recommend that he increase his activity level, which I will discuss with him and his wife. He may also benefit from increasing exercise, initiating a MIND DASH diet, and cultivating more satisfying social relationships. Driving safety will need to be carefully monitored as his wife is starting to notice changes."    CURRENT MEDICATIONS:  Outpatient Encounter Medications as of 03/11/2021  Medication Sig   aspirin EC 81 MG tablet Take 81 mg by mouth daily.   carbidopa-levodopa (SINEMET IR) 25-100 MG tablet Take 1 tablet by  mouth in the morning and at bedtime.   CINNAMON PO Take 1,000 mg by mouth daily.   fish oil-omega-3 fatty acids 1000 MG capsule Take 1 g by mouth daily.   glipiZIDE (GLUCOTROL) 5 MG tablet TAKE 1 TABLET BY MOUTH TWICE DAILY BEFORE A MEAL   Lancets (BD LANCET ULTRAFINE 30G) MISC USE ONE  TO CHECK GLUCOSE ONCE DAILY   MAGNESIUM CHLORIDE PO Take 250 mg by mouth daily.   memantine (NAMENDA) 10 MG tablet Take 1 tablet by mouth twice daily   nitroGLYCERIN (NITROSTAT) 0.4 MG SL tablet PLACE 1 TABLET UNDER THE TONGUE EVERY 5 MINUTES FOR 3 DOSES AS NEEDED FOR CHEST PAIN   ONETOUCH ULTRA test strip USE TO TEST DAILY   POTASSIUM GLUCONATE PO Take 99 mg by mouth daily.   [place] 420 MG/3.5ML SOCT INJECT 420 MG INTO SKIN EVERY 30 DAYS   vitamin B-12 (CYANOCOBALAMIN) 1000 MCG tablet Take 1,000 mcg by  mouth daily.   [DISCONTINUED] carbidopa-levodopa (SINEMET IR) 25-100 MG tablet TAKE 1 TABLET BY MOUTH THREE TIMES DAILY WITH MEALS   [DISCONTINUED] memantine (NAMENDA) 10 MG tablet Take 1 tablet by mouth twice daily   No facility-administered encounter medications on file as of 03/11/2021.     Objective:     PHYSICAL EXAMINATION:    VITALS:   Vitals:   03/11/21 0859  BP: 124/82  Pulse: 72  Resp: 18  SpO2: 97%  Weight: 213 lb (96.6 kg)  Height: 5\' 9"  (1.753 m)     GEN:  The patient appears stated age and is in NAD. HEENT:  Normocephalic, atraumatic.   Neurological examination:  General: NAD, well-groomed, appears stated age. Orientation: alert and oriented to person, place and time.   Cranial nerves: There is good facial symmetry.The speech is not fluent but clear. No aphasia or dysarthria. Fund of knowledge is appropriate. Recent and remote memory are impaired. Attention and concentration are reduced. Able to name objects and repeat phrases.  Hearing is intact to conversational tone.    Sensation: Sensation is intact to light touch throughout Motor: Strength is at least antigravity x4.     Montreal Cognitive Assessment  11/11/2018 04/06/2018  Visuospatial/ Executive (0/5) - 4  Naming (0/3) - 3  Attention: Read list of digits (0/2) 2 2  Attention: Read list of letters (0/1) 1 1  Attention: Serial 7 subtraction starting at 100 (0/3) 3 3  Language: Repeat phrase (0/2) 1 1  Language : Fluency (0/1) 0 0  Abstraction (0/2) 1 2  Delayed Recall (0/5) 0 2  Orientation (0/6) 5 6  Total - 24  Adjusted Score (based on education) - 25     MMSE - Mini Mental State Exam 02/21/2020 12/28/2019  Orientation to time 3 4  Orientation to Place 5 5  Registration 3 3  Attention/ Calculation 3 5  Recall 1 0  Language- name 2 objects 2 2  Language- repeat 1 1  Language- follow 3 step command 3 3  Language- read & follow direction 1 1  Write a sentence 1 1  Copy design 1 1  Total  score 24 26      Movement examination: Tone: There is normal tone in the UE/LE Abnormal movements:   His left hand tremor is  well improved, no postural endpoint tremor. Mild cogwheeling bilaterally L>R   No myoclonus.  No asterixis.   Coordination:  There is no decremation with RAM's. Normal finger to nose  Gait and Station: The patient  has no difficulty arising out of a deep-seated chair without the use of the hands. The patient's stride length is good.   Gait is steady, ambulating without a walker, but has small steps, wider gait, reduced arm swing.  Romberg negative.  No postural instability.   CBC CBC Latest Ref Rng & Units 11/07/2020 11/07/2019 11/09/2018  WBC 4.0 - 10.5 K/uL 7.2 7.2 6.1  Hemoglobin 13.0 - 17.0 g/dL 15.1 14.8 14.7  Hematocrit 39.0 - 52.0 % 45.5 44.4 43.9  Platelets 150.0 - 400.0 K/uL 199.0 204 191.0     CMP Latest Ref Rng & Units 11/07/2020 11/07/2019 06/01/2019  Glucose 70 - 99 mg/dL 148(H) 129(H) 134(H)  BUN 6 - 23 mg/dL 21 24 22   Creatinine 0.40 - 1.50 mg/dL 1.46 1.26(H) 1.23  Sodium 135 - 145 mEq/L 137 138 142  Potassium 3.5 - 5.1 mEq/L 4.7 4.5 4.5  Chloride 96 - 112 mEq/L 101 107 104  CO2 19 - 32 mEq/L 27 24 22   Calcium 8.4 - 10.5 mg/dL 9.7 9.3 9.2  Total Protein 6.0 - 8.3 g/dL 7.1 6.6 6.6  Total Bilirubin 0.2 - 1.2 mg/dL 0.6 0.5 0.3  Alkaline Phos 39 - 117 U/L 55 - 55  AST 0 - 37 U/L 12 19 18   ALT 0 - 53 U/L 12 13 14        Cc:  Laurey Morale, MD Sharene Butters, PA-C

## 2021-03-11 NOTE — Patient Instructions (Signed)
Try reducing Carbidopa/Levodopa to 1 tablet in AM, 1  tablet at supper .    2. Continue Memantine 10mg  twice a day  3. Referral to PT, prefer to Parkinsons    4. Follow-up in 6 months, call for any changes   FALL PRECAUTIONS: Be cautious when walking. Scan the area for obstacles that may increase the risk of trips and falls. When getting up in the mornings, sit up at the edge of the bed for a few minutes before getting out of bed. Consider elevating the bed at the head end to avoid drop of blood pressure when getting up. Walk always in a well-lit room (use night lights in the walls). Avoid area rugs or power cords from appliances in the middle of the walkways. Use a walker or a cane if necessary and consider physical therapy for balance exercise. Get your eyesight checked regularly.  FINANCIAL OVERSIGHT: Supervision, especially oversight when making financial decisions or transactions is also recommended.  HOME SAFETY: Consider the safety of the kitchen when operating appliances like stoves, microwave oven, and blender. Consider having supervision and share cooking responsibilities until no longer able to participate in those. Accidents with firearms and other hazards in the house should be identified and addressed as well.  DRIVING: Regarding driving, in patients with progressive memory problems, driving will be impaired. We advise to have someone else do the driving if trouble finding directions or if minor accidents are reported. Independent driving assessment is available to determine safety of driving.  ABILITY TO BE LEFT ALONE: If patient is unable to contact 911 operator, consider using LifeLine, or when the need is there, arrange for someone to stay with patients. Smoking is a fire hazard, consider supervision or cessation. Risk of wandering should be assessed by caregiver and if detected at any point, supervision and safe proof recommendations should be instituted.  MEDICATION  SUPERVISION: Inability to self-administer medication needs to be constantly addressed. Implement a mechanism to ensure safe administration of the medications.  RECOMMENDATIONS FOR ALL PATIENTS WITH MEMORY PROBLEMS: 1. Continue to exercise (Recommend 30 minutes of walking everyday, or 3 hours every week) 2. Increase social interactions - continue going to [place] and enjoy social gatherings with friends and family 3. Eat healthy, avoid fried foods and eat more fruits and vegetables 4. Maintain adequate blood pressure, blood sugar, and blood cholesterol level. Reducing the risk of stroke and cardiovascular disease also helps promoting better memory. 5. Avoid stressful situations. Live a simple life and avoid aggravations. Organize your time and prepare for the next day in anticipation. 6. Sleep well, avoid any interruptions of sleep and avoid any distractions in the bedroom that may interfere with adequate sleep quality 7. Avoid sugar, avoid sweets as there is a strong link between excessive sugar intake, diabetes, and cognitive impairment The Mediterranean diet has been shown to help patients reduce the risk of progressive memory disorders and reduces cardiovascular risk. This includes eating fish, eat fruits and green leafy vegetables, nuts like almonds and hazelnuts, walnuts, and also use olive oil. Avoid fast foods and fried foods as much as possible. Avoid sweets and sugar as sugar use has been linked to worsening of memory function.  There is always a concern of gradual progression of memory problems. If this is the case, then we may need to adjust level of care according to patient needs. Support, both to the patient and caregiver, should then be put into place.

## 2021-03-19 ENCOUNTER — Telehealth: Payer: Self-pay | Admitting: Neurology

## 2021-03-19 DIAGNOSIS — G20 Parkinson's disease: Secondary | ICD-10-CM

## 2021-03-19 NOTE — Telephone Encounter (Signed)
That is fine, thanks 

## 2021-03-19 NOTE — Telephone Encounter (Signed)
Patient's wife called and requested a referral for home PT to be sent to [facility] in [place].

## 2021-03-19 NOTE — Telephone Encounter (Signed)
Called and spoke to patients wife and informed her that referral has been sent to [facility].

## 2021-03-20 ENCOUNTER — Telehealth: Payer: Self-pay | Admitting: Physician Assistant

## 2021-03-20 NOTE — Telephone Encounter (Signed)
Santiago Glad with [place] [facility] called to let Sharene Butters know the patients insurance is not in network.

## 2021-03-20 NOTE — Telephone Encounter (Signed)
Spoke with pt wife an informed her that Joe Murray was not in network with his insurance but I will resend order to different home health. Pt wife verbalized understanding,

## 2021-03-25 DIAGNOSIS — E785 Hyperlipidemia, unspecified: Secondary | ICD-10-CM | POA: Diagnosis not present

## 2021-03-25 DIAGNOSIS — Z7984 Long term (current) use of oral hypoglycemic drugs: Secondary | ICD-10-CM | POA: Diagnosis not present

## 2021-03-25 DIAGNOSIS — G20 Parkinson's disease: Secondary | ICD-10-CM | POA: Diagnosis not present

## 2021-03-25 DIAGNOSIS — K219 Gastro-esophageal reflux disease without esophagitis: Secondary | ICD-10-CM | POA: Diagnosis not present

## 2021-03-25 DIAGNOSIS — N138 Other obstructive and reflux uropathy: Secondary | ICD-10-CM | POA: Diagnosis not present

## 2021-03-25 DIAGNOSIS — M7061 Trochanteric bursitis, right hip: Secondary | ICD-10-CM | POA: Diagnosis not present

## 2021-03-25 DIAGNOSIS — E559 Vitamin D deficiency, unspecified: Secondary | ICD-10-CM | POA: Diagnosis not present

## 2021-03-25 DIAGNOSIS — I252 Old myocardial infarction: Secondary | ICD-10-CM | POA: Diagnosis not present

## 2021-03-25 DIAGNOSIS — R69 Illness, unspecified: Secondary | ICD-10-CM | POA: Diagnosis not present

## 2021-03-25 DIAGNOSIS — G301 Alzheimer's disease with late onset: Secondary | ICD-10-CM | POA: Diagnosis not present

## 2021-03-25 DIAGNOSIS — Z7901 Long term (current) use of anticoagulants: Secondary | ICD-10-CM | POA: Diagnosis not present

## 2021-03-25 DIAGNOSIS — N401 Enlarged prostate with lower urinary tract symptoms: Secondary | ICD-10-CM | POA: Diagnosis not present

## 2021-03-25 DIAGNOSIS — M791 Myalgia, unspecified site: Secondary | ICD-10-CM | POA: Diagnosis not present

## 2021-03-25 DIAGNOSIS — E1151 Type 2 diabetes mellitus with diabetic peripheral angiopathy without gangrene: Secondary | ICD-10-CM | POA: Diagnosis not present

## 2021-03-25 DIAGNOSIS — H18519 Endothelial corneal dystrophy, unspecified eye: Secondary | ICD-10-CM | POA: Diagnosis not present

## 2021-03-25 DIAGNOSIS — I2510 Atherosclerotic heart disease of native coronary artery without angina pectoris: Secondary | ICD-10-CM | POA: Diagnosis not present

## 2021-03-25 DIAGNOSIS — I10 Essential (primary) hypertension: Secondary | ICD-10-CM | POA: Diagnosis not present

## 2021-03-27 ENCOUNTER — Ambulatory Visit (INDEPENDENT_AMBULATORY_CARE_PROVIDER_SITE_OTHER): Payer: Medicare HMO

## 2021-03-27 VITALS — BP 122/60 | Temp 97.8°F | Ht 70.0 in | Wt 215.5 lb

## 2021-03-27 DIAGNOSIS — Z0000 Encounter for general adult medical examination without abnormal findings: Secondary | ICD-10-CM | POA: Diagnosis not present

## 2021-03-27 NOTE — Progress Notes (Signed)
Subjective:   Joe Murray is a 81 y.o. male who presents for Medicare Annual/Subsequent preventive examination.  Review of Systems     Cardiac Risk Factors include: advanced age (>16men, >72 women);diabetes mellitus;male gender     Objective:    Today's Vitals   03/27/21 1034  BP: 122/60  Temp: 97.8 F (36.6 C)  TempSrc: Oral  Weight: 215 lb 8 oz (97.8 kg)  Height: 5\' 10"  (1.778 m)   Body mass index is 30.92 kg/m.  Advanced Directives 03/27/2021 03/11/2021 09/04/2020 03/26/2020 12/28/2019 11/18/2019 06/19/2019  Does Patient Have a Medical Advance Directive? Yes Yes Yes Yes Yes No Yes  Type of Paramedic of [place];Living will - - [place];Living will [place];Living will;Out of facility DNR (pink MOST or yellow form) - [place];Living will  Does patient want to make changes to medical advance directive? No - Patient declined - - No - Patient declined - - -  Copy of [place] in Chart? No - copy requested - - No - copy requested - - -  Would patient like information on creating a medical advance directive? - - - - - No - Patient declined -    Current Medications (verified) Outpatient Encounter Medications as of 03/27/2021  Medication Sig   aspirin EC 81 MG tablet Take 81 mg by mouth daily.   carbidopa-levodopa (SINEMET IR) 25-100 MG tablet Take 1 tablet by mouth in the morning and at bedtime.   glipiZIDE (GLUCOTROL) 5 MG tablet TAKE 1 TABLET BY MOUTH TWICE DAILY BEFORE A MEAL   Lancets (BD LANCET ULTRAFINE 30G) MISC USE ONE  TO CHECK GLUCOSE ONCE DAILY   MAGNESIUM CHLORIDE PO Take 250 mg by mouth daily.   memantine (NAMENDA) 10 MG tablet Take 1 tablet by mouth twice daily   nitroGLYCERIN (NITROSTAT) 0.4 MG SL tablet PLACE 1 TABLET UNDER THE TONGUE EVERY 5 MINUTES FOR 3 DOSES AS NEEDED FOR CHEST PAIN   ONETOUCH ULTRA test strip USE TO TEST DAILY   POTASSIUM GLUCONATE PO Take 99  mg by mouth daily.   [place] 420 MG/3.5ML SOCT INJECT 420 MG INTO SKIN EVERY 30 DAYS   vitamin B-12 (CYANOCOBALAMIN) 1000 MCG tablet Take 1,000 mcg by mouth daily.   [DISCONTINUED] CINNAMON PO Take 1,000 mg by mouth daily.   [DISCONTINUED] fish oil-omega-3 fatty acids 1000 MG capsule Take 1 g by mouth daily.   No facility-administered encounter medications on file as of 03/27/2021.    Allergies (verified) Beta adrenergic blockers, Praluent [alirocumab], Sulfonamide derivatives, and Statins   History: Past Medical History:  Diagnosis Date   Alzheimer's dementia [facility])    sees Dr. Ellouise Newer   CAD S/P percutaneous coronary angioplasty 10/'03; 3/'09; 7/*09; 1/'17    a. Dr. Ellyn Hack; '03 - Cypher DES 3.0 mm 33 mm proximal-mid LAD (details D1 with ostial 60%); 3/'09 dRCA 2.75 mm x 13 mm Cypher DES (2.8 mm); 7/'09 pRCA 3.0 mm x 18 m Cypher DES (3.25 mm);; (2009) 2D Echo - EF 55%, (November 2014) nonischemic Myoview;; b. Promus DES to RCA and mid LAD 02/26/2015   Chronotropic incompetence 12/2011   ETT 11/2017: Normal blood pressure response.  No EKG changes.  Exercise stopped due to fatigue and dyspnea.  Heart rate increased to low 90s from 50s.  Suggest chronotropic incompetence.  Severely impaired exercise capacity. -->  Results reviewed with electrophysiology: Still not indication for pacemaker.   Diabetes mellitus  type 2 in obese [facility])    CAD   Diverticulosis    Dyslipidemia, goal LDL below 70    on Pharm[employer] study medicaion.   ED (erectile dysfunction)    GERD (gastroesophageal reflux disease)    Hemorrhoids 07/2002   Internal and External   Insomnia    Nodular basal cell carcinoma (BCC) 05/08/2019   left ear (cx7fu)   Non-STEMI (non-ST elevated myocardial infarction) ([place]) 11/2001   a. Proximal LAD tandem lesions -- long DES stent covering both;; b. Jan 2017: mRCA PCI, mLAD PCI   Parkinson's disease [facility])    sees Dr. Ellouise Newer   Persistent sinus bradycardia     Personal history of colonic adenomas 09/06/2012   Psoriasis    Resting HR in 54s   SCC (squamous cell carcinoma) 11/13/2001   right upper outer arm (txpbx)   SCC (squamous cell carcinoma) 11/13/2009   right upper ear rim (cx3 75fu)   SCC (squamous cell carcinoma) 01/20/2010   right nose (cx20fu)   SCC (squamous cell carcinoma) 03/28/2014   right side of nose ( cx77fu)   SCC (squamous cell carcinoma) 06/03/2016   right side of nose   SCC (squamous cell carcinoma) 05/08/2019   right ear inferior (cx54fu) cis   Spasm of esophagus    Squamous cell carcinoma in situ (SCCIS) 05/08/2019   Right Ear Inferior   Squamous cell carcinoma of skin 11/08/2001   top right rim ear    SVT (supraventricular tachycardia) ([place]) 2003   Vertigo, benign positional    Past Surgical History:  Procedure Laterality Date   CARDIAC CATHETERIZATION N/A 02/26/2015   Procedure: Left Heart Cath and Coronary Angiography;  Surgeon: Leonie Man, MD: mRCA 99% --> PCI. mLAD 75%-> FFR Guided PCI, Mod ISR in pLAD & pRCA   CARDIAC CATHETERIZATION N/A 02/26/2015   Procedure: Coronary Stent Intervention;  Surgeon: Leonie Man, MD;  Location: [facility] INVASIVE CV LAB: mRCA Promus Premier DES 3.0 x 12 (3.5), mLAD Promus Premier DES 2.75 x 20 (3.0)   CARDIAC CATHETERIZATION N/A 02/26/2015   Procedure: Intravascular Pressure Wire/FFR Study;  Surgeon: Leonie Man, MD;  Location: [place] CV LAB;  Service: Cardiovascular: LAD 75% - FFR + -> PCI   CATARACT EXTRACTION  [id number]   COLONOSCOPY  12/16/2017   per Dr. Carlean Purl, adenomatous polyps, no repeats due to age    [place]  LAD - 2003, RCA 3 & 7/ '09   Cypher 3.0 x 32 mid LAD; Cyper 2.75 x 13 - distal RCA, 3.0 x 18 Prox RCA   ESOPHAGOGASTRODUODENOSCOPY  08-11-02   esophageal dilation per Dr. Carlean Purl   HERNIA REPAIR     right inguinal    NM MYOVIEW LTD  Nov 2014   ~8 METS; EF 60%, no ischemia or infarction   PERCUTANEOUS CORONARY STENT  INTERVENTION (PCI-S)  11/23/2001   NSTEMI: Prox-Mid LAD tandem ~80% lesions on either side of D1 (with 80% lesion) -- Cypher DES 3.0 mm x 33 m (covering both lesions)    PERCUTANEOUS CORONARY STENT INTERVENTION (PCI-S)  04/27/2007   Unstable Angina: Distal RCA 95%: Cypher 2.75x13  (2.8 mm); residual focal ~60%ISR in LAD stent, 60% D1 ostial (no PTCA on LAD due to no ischemia on ST)   PERCUTANEOUS CORONARY STENT INTERVENTION (PCI-S)  09/21/2007   Bradycardia & Unstable Angina: Prox RCA 70% - PCI Cypher DES 3.0 mm x 18 mm  (3.25 mm); ostial 60-70% jailed D1. LAD & distal RCA  stents patent   SHOULDER SURGERY     left rotator cuff, Dr. Gladstone Lighter   TRANSTHORACIC ECHOCARDIOGRAM  March 2009   Normal LV size and function, EF 55%. Mild MR; mild RV dilation.   TRANSTHORACIC ECHOCARDIOGRAM  02/2015   EF 60-65%. Moderate concentric LVH. Normal function with normal regional wall motion. GR 1 DD   WRIST FRACTURE SURGERY     right   Family History  Problem Relation Age of Onset   Diabetes Mother    Hearing loss Father    Dementia Father    Heart attack Father 56       first MI prior to age 30   Heart attack Brother    Social History   Socioeconomic History   Marital status: Married    Spouse name: Not on file   Number of children: 4   Years of education: Not on file   Highest education level: Not on file  Occupational History   Occupation: retired  Tobacco Use   Smoking status: Every Day    Types: Cigars    Start date: 07/06/1952   Smokeless tobacco: Never   Tobacco comments:    1-2 per day; he says "I really don't inhale"  Vaping Use   Vaping Use: Never used  Substance and Sexual Activity   Alcohol use: Yes    Alcohol/week: 0.0 standard drinks    Comment: occ   Drug use: No   Sexual activity: Never  Other Topics Concern   Not on file  Social History Narrative   He is a married, father of 54, grandfather 3.   He still smokes 2-3 cigars per day. States that he "does not really inhale ".  He is not really all that it's including, stating that he wants to have his 1 remaining vice.   Exercises only on occasion.   He does various landscaping jobs including cutting wood, and clearing brush.      Pt is right handed   Lives in 2 story home with his wife   High school graduate   Retired Dealer   Social Determinants of Radio broadcast assistant Strain: Low Risk    Difficulty of Paying Living Expenses: Not hard at all  Food Insecurity: No Food Insecurity   Worried About Charity fundraiser in the Last Year: Never true   Arboriculturist in the Last Year: Never true  Transportation Needs: No Transportation Needs   Lack of Transportation (Medical): No   Lack of Transportation (Non-Medical): No  Physical Activity: Insufficiently Active   Days of Exercise per Week: 2 days   Minutes of Exercise per Session: 30 min  Stress: No Stress Concern Present   Feeling of Stress : Not at all  Social Connections: Socially Integrated   Frequency of Communication with Friends and Family: More than three times a week   Frequency of Social Gatherings with Friends and Family: More than three times a week   Attends Religious Services: 1 to 4 times per year   Active Member of [employer] or Organizations: Yes   Attends Archivist Meetings: 1 to 4 times per year   Marital Status: Married    Tobacco Counseling Ready to quit: Not Answered Counseling given: Not Answered Tobacco comments: 1-2 per day; he says "I really don't inhale"   Clinical Intake:  Pre-visit preparation completed: YesNutrition Risk Assessment:  Has the patient had any N/V/D within the last 2 months?  No  Does the  patient have any non-healing wounds?  No  Has the patient had any unintentional weight loss or weight gain?  No   Diabetes:  Is the patient diabetic?  No  If diabetic, was a CBG obtained today?  No  Did the patient bring in their glucometer from home?  No  How often do you monitor your CBG's? qod.    Financial Strains and Diabetes Management:  Are you having any financial strains with the device, your supplies or your medication? No .  Does the patient want to be seen by Chronic Care Management for management of their diabetes?  No  Would the patient like to be referred to a Nutritionist or for Diabetic Management?  No   Diabetic Exams:  Diabetic Eye Exam: Completed Yes. Overdue for diabetic eye exam. Pt has been advised about the importance in completing this exam. A referral has been placed today. Message sent to referral coordinator for scheduling purposes. Advised pt to expect a call from office referred to regarding appt.  Diabetic Foot Exam: Completed Yes. Pt has been advised about the importance in completing this exam. Pt is scheduled for diabetic foot exam on Followed by PCP.    Pain : No/denies painDiabetic?  Yes  Interpreter Needed?: NoActivities of Daily Living In your present state of health, do you have any difficulty performing the following activities: 03/27/2021  Hearing? N  Vision? N  Difficulty concentrating or making decisions? N  Walking or climbing stairs? Y  Comment Parkinsons Followed by Dr Delice Lesch  Dressing or bathing? N  Comment Wife Assist  Doing errands, shopping? N  Comment [place] and eating ? N  Using the Toilet? N  In the past six months, have you accidently leaked urine? N  Do you have problems with loss of bowel control? N  Managing your Medications? N  Comment Wife Assist  Managing your Finances? Y  Comment Wife Assist  Housekeeping or managing your Housekeeping? Y  Comment Wife Assist  Some recent data might be hidden    Patient Care Team: Laurey Morale, MD as PCP - General Leonie Man, MD as PCP - Cardiology (Cardiology) Cameron Sprang, MD as Consulting Physician (Neurology) Lavonna Monarch, MD as Consulting Physician (Dermatology) Viona Gilmore, [facility] as Pharmacist (Pharmacist)  Indicate any recent  Medical Services you may have received from other than [facility] providers in the past year (date may be approximate).     Assessment:   This is a routine wellness examination for [place].  Hearing/Vision screen Hearing Screening - Comments:: No difficulty hearing Vision Screening - Comments:: Wears glasses. Followed by Jeanmarie Plant Dr G  Dietary issues and exercise activities discussed: Current Exercise Habits: Structured exercise class;Home exercise routine, Type of exercise: walking;Other - see comments (Followed by Physical Therapy), Time (Minutes): 30, Frequency (Times/Week): 2, Weekly Exercise (Minutes/Week): 60, Intensity: Mild, Exercise limited by: neurologic condition(s)   Goals Addressed               This Visit's Progress     Exercise 3x per week (30 min per time) (pt-stated)        Would like to walk better.       Depression Screen PHQ 2/9 Scores 03/27/2021 03/26/2020 08/27/2017 08/16/2015 07/04/2014  PHQ - 2 Score 0 0 0 0 0    Fall Risk Fall Risk  03/27/2021 03/11/2021 11/07/2020 09/04/2020 03/26/2020  Falls in the past year? 1 0 1 1 1   Number falls in past  yr: 0 0 1 0 1  Injury with Fall? 0 0 0 0 1  Comment Fall w/o injury or medical attention needed - - - -  Risk for fall due to : Impaired balance/gait - Impaired balance/gait - History of fall(s);Impaired balance/gait  Follow up Falls evaluation completed - - - Falls prevention discussed;Falls evaluation completed    FALL RISK PREVENTION PERTAINING TO THE HOME:  Any stairs in or around the home? Yes  If so, are there any without handrails? No  Home free of loose throw rugs in walkways, pet beds, electrical cords, etc? Yes Adequate lighting in your home to reduce risk of falls? Yes   ASSISTIVE DEVICES UTILIZED TO PREVENT FALLS:  Life alert? No  Use of a cane, walker or w/c? Yes  Grab bars in the bathroom? No  Shower chair or bench in shower? Yes  Elevated toilet seat or a handicapped toilet? No   TIMED UP AND  GO:  Was the test performed? Yes .  Length of time to ambulate 10 feet: 5 sec.   Gait slow and steady without use of assistive device  Cognitive Function: MMSE - Mini Mental State Exam 02/21/2020 12/28/2019  Orientation to time 3 4  Orientation to Place 5 5  Registration 3 3  Attention/ Calculation 3 5  Recall 1 0  Language- name 2 objects 2 2  Language- repeat 1 1  Language- follow 3 step command 3 3  Language- read & follow direction 1 1  Write a sentence 1 1  Copy design 1 1  Total score 24 26   Montreal Cognitive Assessment  11/11/2018 04/06/2018  Visuospatial/ Executive (0/5) - 4  Naming (0/3) - 3  Attention: Read list of digits (0/2) 2 2  Attention: Read list of letters (0/1) 1 1  Attention: Serial 7 subtraction starting at 100 (0/3) 3 3  Language: Repeat phrase (0/2) 1 1  Language : Fluency (0/1) 0 0  Abstraction (0/2) 1 2  Delayed Recall (0/5) 0 2  Orientation (0/6) 5 6  Total - 24  Adjusted Score (based on education) - 25   6CIT Screen 03/27/2021 03/26/2020  What Year? 0 points 0 points  What month? 0 points 0 points  What time? 0 points 0 points  Count back from 20 0 points 0 points  Months in reverse 2 points 0 points  Repeat phrase 2 points 4 points  Total Score 4 4    Immunizations Immunization History  Administered Date(s) Administered   Fluad Quad(high Dose 65+) 11/04/2018, 11/07/2019   Influenza Split 11/27/2010   Influenza, High Dose Seasonal PF 12/18/2013, 01/02/2015, 12/26/2016, 12/19/2017, 12/18/2020   Influenza-Unspecified 12/24/2012, 12/18/2013, 12/25/2014   PFIZER(Purple Top)SARS-COV-2 Vaccination 03/16/2019, 04/06/2019, 10/14/2019, 09/08/2020   Pfizer Covid-19 Vaccine Bivalent Booster 21yrs & up 12/25/2020   Pneumococcal Conjugate-13 08/16/2015   Pneumococcal Polysaccharide-23 05/01/2009   Zoster Recombinat (Shingrix) 12/19/2017, 02/18/2018    TDAP status: Up to date  Flu Vaccine status: Up to date  Pneumococcal vaccine status: Up to  date  Covid-19 vaccine status: Completed vaccines  Qualifies for Shingles Vaccine? Yes   Zostavax completed Yes   Shingrix Completed?: Yes  Screening Tests Health Maintenance  Topic Date Due   FOOT EXAM  Never done   URINE MICROALBUMIN  08/08/2016   OPHTHALMOLOGY EXAM  03/26/2018   TETANUS/TDAP  11/23/2022 (Originally 02/02/1960)   HEMOGLOBIN A1C  05/07/2021   Pneumonia Vaccine 62+ Years old  Completed   INFLUENZA VACCINE  Completed  COVID-19 Vaccine  Completed   Zoster Vaccines- Shingrix  Completed   HPV VACCINES  Aged Out    Health Maintenance  Health Maintenance Due  Topic Date Due   FOOT EXAM  Never done   URINE MICROALBUMIN  08/08/2016   OPHTHALMOLOGY EXAM  03/26/2018    Additional Screening:   Vision Screening: Recommended annual ophthalmology exams for early detection of glaucoma and other disorders of the eye. Is the patient up to date with their annual eye exam?  Yes  Who is the provider or what is the name of the office in which the patient attends annual eye exams? Dr Jeni Salles [place] If pt is not established with a provider, would they like to be referred to a provider to establish care? No .   Dental Screening: Recommended annual dental exams for proper oral hygiene  Community Resource Referral / Chronic Care Management: CRR required this visit?  No   CCM required this visit?  No      Plan:     I have personally reviewed and noted the following in the patients chart:   Medical and social history Use of alcohol, tobacco or illicit drugs  Current medications and supplements including opioid prescriptions. Patient is not currently taking opioid prescriptions. Functional ability and status Nutritional status Physical activity Advanced directives List of other physicians Hospitalizations, surgeries, and ER visits in previous 12 months Vitals Screenings to include cognitive, depression, and falls Referrals and appointments  In addition, I have  reviewed and discussed with patient certain preventive protocols, quality metrics, and best practice recommendations. A written personalized care plan for preventive services as well as general preventive health recommendations were provided to patient.     Criselda Peaches, LPN   0/10/3265   Nurse Notes: None

## 2021-03-27 NOTE — Patient Instructions (Addendum)
Joe Murray , Thank you for taking time to come for your Medicare Wellness Visit. I appreciate your ongoing commitment to your health goals. Please review the following plan we discussed and let me know if I can assist you in the future.   These are the goals we discussed:  Goals       Exercise 3x per week (30 min per time) (pt-stated)      Would like to walk better.      Track and Manage My Blood Pressure-Hypertension      Timeframe:  Long-Range Goal Priority:  Medium Start Date:                             Expected End Date:                       Follow Up Date 03/24/21    - check blood pressure weekly - choose a place to take my blood pressure (home, clinic or office, retail store) - write blood pressure results in a log or diary    Why is this important?   You won't feel high blood pressure, but it can still hurt your blood vessels.  High blood pressure can cause heart or kidney problems. It can also cause a stroke.  Making lifestyle changes like losing a little weight or eating less salt will help.  Checking your blood pressure at home and at different times of the day can help to control blood pressure.  If the doctor prescribes medicine remember to take it the way the doctor ordered.  Call the office if you cannot afford the medicine or if there are questions about it.     Notes:         This is a list of the screening recommended for you and due dates:  Health Maintenance  Topic Date Due   Complete foot exam   Never done   Urine Protein Check  08/08/2016   Eye exam for diabetics  03/26/2018   Tetanus Vaccine  11/23/2022*   Hemoglobin A1C  05/07/2021   Pneumonia Vaccine  Completed   Flu Shot  Completed   COVID-19 Vaccine  Completed   Zoster (Shingles) Vaccine  Completed   HPV Vaccine  Aged Out  *Topic was postponed. The date shown is not the original due date.   Advanced directives: Yes Patient will bring copy  Conditions/risks identified: None  Next  appointment: Follow up in one year for your annual wellness visit.   Preventive Care 1 Years and Older, Male Preventive care refers to lifestyle choices and visits with your health care provider that can promote health and wellness. What does preventive care include? A yearly physical exam. This is also called an annual well check. Dental exams once or twice a year. Routine eye exams. Ask your health care provider how often you should have your eyes checked. Personal lifestyle choices, including: Daily care of your teeth and gums. Regular physical activity. Eating a healthy diet. Avoiding tobacco and drug use. Limiting alcohol use. Practicing safe sex. Taking low doses of aspirin every day. Taking vitamin and mineral supplements as recommended by your health care provider. What happens during an annual well check? The services and screenings done by your health care provider during your annual well check will depend on your age, overall health, lifestyle risk factors, and family history of disease. Counseling  Your health care provider may  ask you questions about your: Alcohol use. Tobacco use. Drug use. Emotional well-being. Home and relationship well-being. Sexual activity. Eating habits. History of falls. Memory and ability to understand (cognition). Work and work Statistician. Screening  You may have the following tests or measurements: Height, weight, and BMI. Blood pressure. Lipid and cholesterol levels. These may be checked every 5 years, or more frequently if you are over 29 years old. Skin check. Lung cancer screening. You may have this screening every year starting at age 35 if you have a 30-pack-year history of smoking and currently smoke or have quit within the past 15 years. Fecal occult blood test (FOBT) of the stool. You may have this test every year starting at age 76. Flexible sigmoidoscopy or colonoscopy. You may have a sigmoidoscopy every 5 years or a  colonoscopy every 10 years starting at age 63. Prostate cancer screening. Recommendations will vary depending on your family history and other risks. Hepatitis C blood test. Hepatitis B blood test. Sexually transmitted disease (STD) testing. Diabetes screening. This is done by checking your blood sugar (glucose) after you have not eaten for a while (fasting). You may have this done every 1-3 years. Abdominal aortic aneurysm (AAA) screening. You may need this if you are a current or former smoker. Osteoporosis. You may be screened starting at age 10 if you are at high risk. Talk with your health care provider about your test results, treatment options, and if necessary, the need for more tests. Vaccines  Your health care provider may recommend certain vaccines, such as: Influenza vaccine. This is recommended every year. Tetanus, diphtheria, and acellular pertussis (Tdap, Td) vaccine. You may need a Td booster every 10 years. Zoster vaccine. You may need this after age 72. Pneumococcal 13-valent conjugate (PCV13) vaccine. One dose is recommended after age 83. Pneumococcal polysaccharide (PPSV23) vaccine. One dose is recommended after age 80. Talk to your health care provider about which screenings and vaccines you need and how often you need them. This information is not intended to replace advice given to you by your health care provider. Make sure you discuss any questions you have with your health care provider. Document Released: 03/08/2015 Document Revised: 10/30/2015 Document Reviewed: 12/11/2014 Elsevier Interactive Patient Education  2017 [place] Prevention in the Home Falls can cause injuries. They can happen to people of all ages. There are many things you can do to make your home safe and to help prevent falls. What can I do on the outside of my home? Regularly fix the edges of walkways and driveways and fix any cracks. Remove anything that might make you trip as you  walk through a door, such as a raised step or threshold. Trim any bushes or trees on the path to your home. Use bright outdoor lighting. Clear any walking paths of anything that might make someone trip, such as rocks or tools. Regularly check to see if handrails are loose or broken. Make sure that both sides of any steps have handrails. Any raised decks and porches should have guardrails on the edges. Have any leaves, snow, or ice cleared regularly. Use sand or salt on walking paths during winter. Clean up any spills in your garage right away. This includes oil or grease spills. What can I do in the bathroom? Use night lights. Install grab bars by the toilet and in the tub and shower. Do not use towel bars as grab bars. Use non-skid mats or decals in the tub or shower.  If you need to sit down in the shower, use a plastic, non-slip stool. Keep the floor dry. Clean up any water that spills on the floor as soon as it happens. Remove soap buildup in the tub or shower regularly. Attach bath mats securely with double-sided non-slip rug tape. Do not have throw rugs and other things on the floor that can make you trip. What can I do in the bedroom? Use night lights. Make sure that you have a light by your bed that is easy to reach. Do not use any sheets or blankets that are too big for your bed. They should not hang down onto the floor. Have a firm chair that has side arms. You can use this for support while you get dressed. Do not have throw rugs and other things on the floor that can make you trip. What can I do in the kitchen? Clean up any spills right away. Avoid walking on wet floors. Keep items that you use a lot in easy-to-reach places. If you need to reach something above you, use a strong step stool that has a grab bar. Keep electrical cords out of the way. Do not use floor polish or wax that makes floors slippery. If you must use wax, use non-skid floor wax. Do not have throw rugs  and other things on the floor that can make you trip. What can I do with my stairs? Do not leave any items on the stairs. Make sure that there are handrails on both sides of the stairs and use them. Fix handrails that are broken or loose. Make sure that handrails are as long as the stairways. Check any carpeting to make sure that it is firmly attached to the stairs. Fix any carpet that is loose or worn. Avoid having throw rugs at the top or bottom of the stairs. If you do have throw rugs, attach them to the floor with carpet tape. Make sure that you have a light switch at the top of the stairs and the bottom of the stairs. If you do not have them, ask someone to add them for you. What else can I do to help prevent falls? Wear shoes that: Do not have high heels. Have rubber bottoms. Are comfortable and fit you well. Are closed at the toe. Do not wear sandals. If you use a stepladder: Make sure that it is fully opened. Do not climb a closed stepladder. Make sure that both sides of the stepladder are locked into place. Ask someone to hold it for you, if possible. Clearly mark and make sure that you can see: Any grab bars or handrails. First and last steps. Where the edge of each step is. Use tools that help you move around (mobility aids) if they are needed. These include: Canes. Walkers. Scooters. Crutches. Turn on the lights when you go into a dark area. Replace any light bulbs as soon as they burn out. Set up your furniture so you have a clear path. Avoid moving your furniture around. If any of your floors are uneven, fix them. If there are any pets around you, be aware of where they are. Review your medicines with your doctor. Some medicines can make you feel dizzy. This can increase your chance of falling. Ask your doctor what other things that you can do to help prevent falls. This information is not intended to replace advice given to you by your health care provider. Make sure  you discuss any questions you have  with your health care provider. Document Released: 12/06/2008 Document Revised: 07/18/2015 Document Reviewed: 03/16/2014 Elsevier Interactive Patient Education  2017 [employer].

## 2021-03-28 DIAGNOSIS — I2510 Atherosclerotic heart disease of native coronary artery without angina pectoris: Secondary | ICD-10-CM | POA: Diagnosis not present

## 2021-03-28 DIAGNOSIS — H18519 Endothelial corneal dystrophy, unspecified eye: Secondary | ICD-10-CM | POA: Diagnosis not present

## 2021-03-28 DIAGNOSIS — E1151 Type 2 diabetes mellitus with diabetic peripheral angiopathy without gangrene: Secondary | ICD-10-CM | POA: Diagnosis not present

## 2021-03-28 DIAGNOSIS — G301 Alzheimer's disease with late onset: Secondary | ICD-10-CM | POA: Diagnosis not present

## 2021-03-28 DIAGNOSIS — E559 Vitamin D deficiency, unspecified: Secondary | ICD-10-CM | POA: Diagnosis not present

## 2021-03-28 DIAGNOSIS — M7061 Trochanteric bursitis, right hip: Secondary | ICD-10-CM | POA: Diagnosis not present

## 2021-03-28 DIAGNOSIS — M791 Myalgia, unspecified site: Secondary | ICD-10-CM | POA: Diagnosis not present

## 2021-03-28 DIAGNOSIS — I10 Essential (primary) hypertension: Secondary | ICD-10-CM | POA: Diagnosis not present

## 2021-03-28 DIAGNOSIS — G20 Parkinson's disease: Secondary | ICD-10-CM | POA: Diagnosis not present

## 2021-03-28 DIAGNOSIS — R69 Illness, unspecified: Secondary | ICD-10-CM | POA: Diagnosis not present

## 2021-04-01 DIAGNOSIS — G20 Parkinson's disease: Secondary | ICD-10-CM | POA: Diagnosis not present

## 2021-04-01 DIAGNOSIS — R69 Illness, unspecified: Secondary | ICD-10-CM | POA: Diagnosis not present

## 2021-04-01 DIAGNOSIS — M7061 Trochanteric bursitis, right hip: Secondary | ICD-10-CM | POA: Diagnosis not present

## 2021-04-01 DIAGNOSIS — M791 Myalgia, unspecified site: Secondary | ICD-10-CM | POA: Diagnosis not present

## 2021-04-01 DIAGNOSIS — E1151 Type 2 diabetes mellitus with diabetic peripheral angiopathy without gangrene: Secondary | ICD-10-CM | POA: Diagnosis not present

## 2021-04-01 DIAGNOSIS — G301 Alzheimer's disease with late onset: Secondary | ICD-10-CM | POA: Diagnosis not present

## 2021-04-01 DIAGNOSIS — I2510 Atherosclerotic heart disease of native coronary artery without angina pectoris: Secondary | ICD-10-CM | POA: Diagnosis not present

## 2021-04-01 DIAGNOSIS — I10 Essential (primary) hypertension: Secondary | ICD-10-CM | POA: Diagnosis not present

## 2021-04-01 DIAGNOSIS — E559 Vitamin D deficiency, unspecified: Secondary | ICD-10-CM | POA: Diagnosis not present

## 2021-04-01 DIAGNOSIS — H18519 Endothelial corneal dystrophy, unspecified eye: Secondary | ICD-10-CM | POA: Diagnosis not present

## 2021-04-04 DIAGNOSIS — I10 Essential (primary) hypertension: Secondary | ICD-10-CM | POA: Diagnosis not present

## 2021-04-04 DIAGNOSIS — H18519 Endothelial corneal dystrophy, unspecified eye: Secondary | ICD-10-CM | POA: Diagnosis not present

## 2021-04-04 DIAGNOSIS — G301 Alzheimer's disease with late onset: Secondary | ICD-10-CM | POA: Diagnosis not present

## 2021-04-04 DIAGNOSIS — M7061 Trochanteric bursitis, right hip: Secondary | ICD-10-CM | POA: Diagnosis not present

## 2021-04-04 DIAGNOSIS — R69 Illness, unspecified: Secondary | ICD-10-CM | POA: Diagnosis not present

## 2021-04-04 DIAGNOSIS — E559 Vitamin D deficiency, unspecified: Secondary | ICD-10-CM | POA: Diagnosis not present

## 2021-04-04 DIAGNOSIS — G20 Parkinson's disease: Secondary | ICD-10-CM | POA: Diagnosis not present

## 2021-04-04 DIAGNOSIS — M791 Myalgia, unspecified site: Secondary | ICD-10-CM | POA: Diagnosis not present

## 2021-04-04 DIAGNOSIS — I2510 Atherosclerotic heart disease of native coronary artery without angina pectoris: Secondary | ICD-10-CM | POA: Diagnosis not present

## 2021-04-04 DIAGNOSIS — E1151 Type 2 diabetes mellitus with diabetic peripheral angiopathy without gangrene: Secondary | ICD-10-CM | POA: Diagnosis not present

## 2021-04-07 ENCOUNTER — Other Ambulatory Visit: Payer: Self-pay | Admitting: Family Medicine

## 2021-04-07 DIAGNOSIS — G301 Alzheimer's disease with late onset: Secondary | ICD-10-CM | POA: Diagnosis not present

## 2021-04-07 DIAGNOSIS — I2510 Atherosclerotic heart disease of native coronary artery without angina pectoris: Secondary | ICD-10-CM | POA: Diagnosis not present

## 2021-04-07 DIAGNOSIS — E1151 Type 2 diabetes mellitus with diabetic peripheral angiopathy without gangrene: Secondary | ICD-10-CM | POA: Diagnosis not present

## 2021-04-07 DIAGNOSIS — R69 Illness, unspecified: Secondary | ICD-10-CM | POA: Diagnosis not present

## 2021-04-07 DIAGNOSIS — I10 Essential (primary) hypertension: Secondary | ICD-10-CM | POA: Diagnosis not present

## 2021-04-07 DIAGNOSIS — E559 Vitamin D deficiency, unspecified: Secondary | ICD-10-CM | POA: Diagnosis not present

## 2021-04-07 DIAGNOSIS — G20 Parkinson's disease: Secondary | ICD-10-CM | POA: Diagnosis not present

## 2021-04-07 DIAGNOSIS — M791 Myalgia, unspecified site: Secondary | ICD-10-CM | POA: Diagnosis not present

## 2021-04-07 DIAGNOSIS — H18519 Endothelial corneal dystrophy, unspecified eye: Secondary | ICD-10-CM | POA: Diagnosis not present

## 2021-04-07 DIAGNOSIS — M7061 Trochanteric bursitis, right hip: Secondary | ICD-10-CM | POA: Diagnosis not present

## 2021-04-09 DIAGNOSIS — I2510 Atherosclerotic heart disease of native coronary artery without angina pectoris: Secondary | ICD-10-CM | POA: Diagnosis not present

## 2021-04-09 DIAGNOSIS — I10 Essential (primary) hypertension: Secondary | ICD-10-CM | POA: Diagnosis not present

## 2021-04-09 DIAGNOSIS — E1151 Type 2 diabetes mellitus with diabetic peripheral angiopathy without gangrene: Secondary | ICD-10-CM | POA: Diagnosis not present

## 2021-04-09 DIAGNOSIS — R69 Illness, unspecified: Secondary | ICD-10-CM | POA: Diagnosis not present

## 2021-04-09 DIAGNOSIS — E559 Vitamin D deficiency, unspecified: Secondary | ICD-10-CM | POA: Diagnosis not present

## 2021-04-09 DIAGNOSIS — M791 Myalgia, unspecified site: Secondary | ICD-10-CM | POA: Diagnosis not present

## 2021-04-09 DIAGNOSIS — G20 Parkinson's disease: Secondary | ICD-10-CM | POA: Diagnosis not present

## 2021-04-09 DIAGNOSIS — G301 Alzheimer's disease with late onset: Secondary | ICD-10-CM | POA: Diagnosis not present

## 2021-04-09 DIAGNOSIS — M7061 Trochanteric bursitis, right hip: Secondary | ICD-10-CM | POA: Diagnosis not present

## 2021-04-09 DIAGNOSIS — H18519 Endothelial corneal dystrophy, unspecified eye: Secondary | ICD-10-CM | POA: Diagnosis not present

## 2021-04-14 DIAGNOSIS — G20 Parkinson's disease: Secondary | ICD-10-CM | POA: Diagnosis not present

## 2021-04-14 DIAGNOSIS — M7061 Trochanteric bursitis, right hip: Secondary | ICD-10-CM | POA: Diagnosis not present

## 2021-04-14 DIAGNOSIS — H18519 Endothelial corneal dystrophy, unspecified eye: Secondary | ICD-10-CM | POA: Diagnosis not present

## 2021-04-14 DIAGNOSIS — M791 Myalgia, unspecified site: Secondary | ICD-10-CM | POA: Diagnosis not present

## 2021-04-14 DIAGNOSIS — I2510 Atherosclerotic heart disease of native coronary artery without angina pectoris: Secondary | ICD-10-CM | POA: Diagnosis not present

## 2021-04-14 DIAGNOSIS — R69 Illness, unspecified: Secondary | ICD-10-CM | POA: Diagnosis not present

## 2021-04-14 DIAGNOSIS — E1151 Type 2 diabetes mellitus with diabetic peripheral angiopathy without gangrene: Secondary | ICD-10-CM | POA: Diagnosis not present

## 2021-04-14 DIAGNOSIS — G301 Alzheimer's disease with late onset: Secondary | ICD-10-CM | POA: Diagnosis not present

## 2021-04-14 DIAGNOSIS — E559 Vitamin D deficiency, unspecified: Secondary | ICD-10-CM | POA: Diagnosis not present

## 2021-04-14 DIAGNOSIS — I10 Essential (primary) hypertension: Secondary | ICD-10-CM | POA: Diagnosis not present

## 2021-04-21 ENCOUNTER — Other Ambulatory Visit: Payer: Self-pay

## 2021-04-21 ENCOUNTER — Ambulatory Visit: Payer: Medicare HMO | Admitting: Podiatry

## 2021-04-21 DIAGNOSIS — M79674 Pain in right toe(s): Secondary | ICD-10-CM

## 2021-04-21 DIAGNOSIS — M79675 Pain in left toe(s): Secondary | ICD-10-CM | POA: Diagnosis not present

## 2021-04-21 DIAGNOSIS — B351 Tinea unguium: Secondary | ICD-10-CM | POA: Diagnosis not present

## 2021-04-21 DIAGNOSIS — E0843 Diabetes mellitus due to underlying condition with diabetic autonomic (poly)neuropathy: Secondary | ICD-10-CM

## 2021-04-22 ENCOUNTER — Other Ambulatory Visit: Payer: Self-pay | Admitting: Family Medicine

## 2021-04-23 ENCOUNTER — Ambulatory Visit: Payer: Medicare HMO | Admitting: Dermatology

## 2021-04-23 ENCOUNTER — Other Ambulatory Visit: Payer: Self-pay

## 2021-04-23 DIAGNOSIS — C4442 Squamous cell carcinoma of skin of scalp and neck: Secondary | ICD-10-CM | POA: Diagnosis not present

## 2021-04-23 DIAGNOSIS — D485 Neoplasm of uncertain behavior of skin: Secondary | ICD-10-CM

## 2021-04-23 DIAGNOSIS — C44329 Squamous cell carcinoma of skin of other parts of face: Secondary | ICD-10-CM

## 2021-04-23 DIAGNOSIS — L820 Inflamed seborrheic keratosis: Secondary | ICD-10-CM | POA: Diagnosis not present

## 2021-04-23 DIAGNOSIS — L2084 Intrinsic (allergic) eczema: Secondary | ICD-10-CM | POA: Diagnosis not present

## 2021-04-23 DIAGNOSIS — C4492 Squamous cell carcinoma of skin, unspecified: Secondary | ICD-10-CM

## 2021-04-23 HISTORY — DX: Squamous cell carcinoma of skin, unspecified: C44.92

## 2021-04-23 NOTE — Patient Instructions (Addendum)
Cera-Ve otc (pramoxine) ? ?Biopsy, Surgery (Curettage) & Surgery (Excision) Aftercare Instructions ? ?1. Okay to remove bandage in 24 hours ? ?2. Wash area with soap and water ? ?3. Apply Vaseline to area twice daily until healed (Not Neosporin) ? ?4. Okay to cover with a Band-Aid to decrease the chance of infection or prevent irritation from clothing; also it's okay to uncover lesion at home. ? ?5. Suture instructions: return to our office in 7-10 or 10-14 days for a nurse visit for suture removal. Variable healing with sutures, if pain or itching occurs call our office. It's okay to shower or bathe 24 hours after sutures are given. ? ?6. The following risks may occur after a biopsy, curettage or excision: bleeding, scarring, discoloration, recurrence, infection (redness, yellow drainage, pain or swelling). ? ?7. For questions, concerns and results call our office at [facility] before 4pm & Friday before 3pm. Biopsy results will be available in 1 week. ? ?

## 2021-04-25 DIAGNOSIS — R69 Illness, unspecified: Secondary | ICD-10-CM | POA: Diagnosis not present

## 2021-04-25 DIAGNOSIS — E785 Hyperlipidemia, unspecified: Secondary | ICD-10-CM | POA: Diagnosis not present

## 2021-04-25 DIAGNOSIS — I2510 Atherosclerotic heart disease of native coronary artery without angina pectoris: Secondary | ICD-10-CM | POA: Diagnosis not present

## 2021-04-25 DIAGNOSIS — N138 Other obstructive and reflux uropathy: Secondary | ICD-10-CM | POA: Diagnosis not present

## 2021-04-25 DIAGNOSIS — E1151 Type 2 diabetes mellitus with diabetic peripheral angiopathy without gangrene: Secondary | ICD-10-CM | POA: Diagnosis not present

## 2021-04-25 DIAGNOSIS — I10 Essential (primary) hypertension: Secondary | ICD-10-CM | POA: Diagnosis not present

## 2021-04-25 DIAGNOSIS — G301 Alzheimer's disease with late onset: Secondary | ICD-10-CM | POA: Diagnosis not present

## 2021-04-25 DIAGNOSIS — E559 Vitamin D deficiency, unspecified: Secondary | ICD-10-CM | POA: Diagnosis not present

## 2021-04-25 DIAGNOSIS — Z7901 Long term (current) use of anticoagulants: Secondary | ICD-10-CM | POA: Diagnosis not present

## 2021-04-25 DIAGNOSIS — K219 Gastro-esophageal reflux disease without esophagitis: Secondary | ICD-10-CM | POA: Diagnosis not present

## 2021-04-25 DIAGNOSIS — M791 Myalgia, unspecified site: Secondary | ICD-10-CM | POA: Diagnosis not present

## 2021-04-25 DIAGNOSIS — G20 Parkinson's disease: Secondary | ICD-10-CM | POA: Diagnosis not present

## 2021-04-25 DIAGNOSIS — H18519 Endothelial corneal dystrophy, unspecified eye: Secondary | ICD-10-CM | POA: Diagnosis not present

## 2021-04-25 DIAGNOSIS — Z7984 Long term (current) use of oral hypoglycemic drugs: Secondary | ICD-10-CM | POA: Diagnosis not present

## 2021-04-25 DIAGNOSIS — M7061 Trochanteric bursitis, right hip: Secondary | ICD-10-CM | POA: Diagnosis not present

## 2021-04-25 DIAGNOSIS — N401 Enlarged prostate with lower urinary tract symptoms: Secondary | ICD-10-CM | POA: Diagnosis not present

## 2021-04-25 DIAGNOSIS — I252 Old myocardial infarction: Secondary | ICD-10-CM | POA: Diagnosis not present

## 2021-04-29 NOTE — Progress Notes (Signed)
SUBJECTIVE Patient with a history of diabetes mellitus presents to office today complaining of elongated, thickened nails that cause pain while ambulating in shoes.  Patient is unable to trim their own nails. Patient is here for further evaluation and treatment.   Past Medical History:  Diagnosis Date   Alzheimer's dementia [facility])    sees Dr. Ellouise Newer   CAD S/P percutaneous coronary angioplasty 10/'03; 3/'09; 7/*09; 1/'17    a. Dr. Ellyn Hack; '03 - Cypher DES 3.0 mm 33 mm proximal-mid LAD (details D1 with ostial 60%); 3/'09 dRCA 2.75 mm x 13 mm Cypher DES (2.8 mm); 7/'09 pRCA 3.0 mm x 18 m Cypher DES (3.25 mm);; (2009) 2D Echo - EF 55%, (November 2014) nonischemic Myoview;; b. Promus DES to RCA and mid LAD 02/26/2015   Chronotropic incompetence 12/2011   ETT 11/2017: Normal blood pressure response.  No EKG changes.  Exercise stopped due to fatigue and dyspnea.  Heart rate increased to low 90s from 50s.  Suggest chronotropic incompetence.  Severely impaired exercise capacity. -->  Results reviewed with electrophysiology: Still not indication for pacemaker.   Diabetes mellitus type 2 in obese [facility])    CAD   Diverticulosis    Dyslipidemia, goal LDL below 70    on [employer] study medicaion.   ED (erectile dysfunction)    GERD (gastroesophageal reflux disease)    Hemorrhoids 07/2002   Internal and External   Insomnia    Nodular basal cell carcinoma (BCC) 05/08/2019   left ear (cx40f)   Non-STEMI (non-ST elevated myocardial infarction) [place]   a. Proximal LAD tandem lesions -- long DES stent covering both;; b. Jan 2017: mRCA PCI, mLAD PCI   Parkinson's disease ([facility]    sees Dr. KEllouise Newer  Persistent sinus bradycardia    Personal history of colonic adenomas 09/06/2012   Psoriasis    Resting HR in 54s   SCC (squamous cell carcinoma) 11/13/2001   right upper outer arm (txpbx)   SCC (squamous cell carcinoma) 11/13/2009   right upper ear rim ([id number] [id number]   SCC (squamous cell  carcinoma) 01/20/2010   right nose ([id number]  SCC (squamous cell carcinoma) 03/28/2014   right side of nose ( [id number] SCC (squamous cell carcinoma) 06/03/2016   right side of nose   SCC (squamous cell carcinoma) 05/08/2019   right ear inferior ([id number]   Spasm of esophagus    Squamous cell carcinoma in situ (SCCIS) 05/08/2019   Right Ear Inferior   Squamous cell carcinoma of skin 11/08/2001   top right rim ear    SVT (supraventricular tachycardia) (HCC) [place]   Vertigo, benign positional    Past Surgical History:  Procedure Laterality Date   CARDIAC CATHETERIZATION N/A 02/26/2015   Procedure: Left Heart Cath and Coronary Angiography;  Surgeon: DavidLeonie Man mRCA 99% --> PCI. mLAD 75%-> FFR Guided PCI, Mod ISR in pLAD & pRCA   CARDIAC CATHETERIZATION N/A 02/26/2015   Procedure: Coronary Stent Intervention;  Surgeon: DavidLeonie Man  Location: [facility] INVASIVE CV LAB: mRCA Promus Premier DES 3.0 x 12 (3.5), mLAD Promus Premier DES 2.75 x 20 (3.0)   CARDIAC CATHETERIZATION N/A 02/26/2015   Procedure: Intravascular Pressure Wire/FFR Study;  Surgeon: DavidLeonie Man  [facility] [place];  Service: Cardiovascular: LAD 75% - FFR + -> PCI   CATARACT EXTRACTION  [id number]  12/16/2017   per Dr. GessnCarlean Purlnomatous polyps, no repeats due to age  CORONARY ANGIOPLASTY WITH STENT PLACEMENT  LAD - 2003, RCA 3 & 7/ '09   Cypher 3.0 x 32 mid LAD; Cyper 2.75 x 13 - distal RCA, 3.0 x 18 Prox RCA   ESOPHAGOGASTRODUODENOSCOPY  08-11-02   esophageal dilation per Dr. Carlean Purl   HERNIA REPAIR     right inguinal    NM MYOVIEW LTD  Nov 2014   ~8 METS; EF 60%, no ischemia or infarction   PERCUTANEOUS CORONARY STENT INTERVENTION (PCI-S)  11/23/2001   NSTEMI: Prox-Mid LAD tandem ~80% lesions on either side of D1 (with 80% lesion) -- Cypher DES 3.0 mm x 33 m (covering both lesions)    PERCUTANEOUS CORONARY STENT INTERVENTION (PCI-S)  04/27/2007   Unstable Angina: Distal RCA  95%: Cypher 2.75x13  (2.8 mm); residual focal ~60%ISR in LAD stent, 60% D1 ostial (no PTCA on LAD due to no ischemia on ST)   PERCUTANEOUS CORONARY STENT INTERVENTION (PCI-S)  09/21/2007   Bradycardia & Unstable Angina: Prox RCA 70% - PCI Cypher DES 3.0 mm x 18 mm  (3.25 mm); ostial 60-70% jailed D1. LAD & distal RCA stents patent   SHOULDER SURGERY     left rotator cuff, Dr. Gladstone Lighter   TRANSTHORACIC ECHOCARDIOGRAM  March 2009   Normal LV size and function, EF 55%. Mild MR; mild RV dilation.   TRANSTHORACIC ECHOCARDIOGRAM  02/2015   EF 60-65%. Moderate concentric LVH. Normal function with normal regional wall motion. GR 1 DD   WRIST FRACTURE SURGERY     right   Allergies  Allergen Reactions   Beta Adrenergic Blockers Other (See Comments)    chronotropic incompetence   Praluent [Alirocumab]     Diffuse Rash - improved when stopping & taking Benadryl   Sulfonamide Derivatives     unknown   Statins Other (See Comments)    REACTION: myalgias    OBJECTIVE General Patient is awake, alert, and oriented x 3 and in no acute distress. Derm Skin is dry and supple bilateral. Negative open lesions or macerations. Remaining integument unremarkable. Nails are tender, long, thickened and dystrophic with subungual debris, consistent with onychomycosis, 1-5 bilateral. No signs of infection noted. Vasc  DP and PT pedal pulses palpable bilaterally. Temperature gradient within normal limits.  Neuro Epicritic and protective threshold sensation diminished bilaterally.  Musculoskeletal Exam No symptomatic pedal deformities noted bilateral. Muscular strength within normal limits.  ASSESSMENT 1. Diabetes Mellitus w/ peripheral neuropathy 2.  Pain due to onychomycosis of toenails bilateral  PLAN OF CARE 1. Patient evaluated today. 2. Instructed to maintain good pedal hygiene and foot care. Stressed importance of controlling blood sugar.  3. Mechanical debridement of nails 1-5 bilaterally performed using a  nail nipper. Filed with dremel without incident.  4.  Recommend OTC Voltaren topical 1% gel as needed  5.  Return to clinic in 3 mos.     Edrick Kins, DPM [facility]  Dr. Edrick Kins, DPM    [address], [place] [address]                Office [phone]  Fax [phone]

## 2021-04-30 DIAGNOSIS — I10 Essential (primary) hypertension: Secondary | ICD-10-CM | POA: Diagnosis not present

## 2021-04-30 DIAGNOSIS — E785 Hyperlipidemia, unspecified: Secondary | ICD-10-CM | POA: Diagnosis not present

## 2021-04-30 DIAGNOSIS — H18519 Endothelial corneal dystrophy, unspecified eye: Secondary | ICD-10-CM | POA: Diagnosis not present

## 2021-04-30 DIAGNOSIS — M7061 Trochanteric bursitis, right hip: Secondary | ICD-10-CM | POA: Diagnosis not present

## 2021-04-30 DIAGNOSIS — R69 Illness, unspecified: Secondary | ICD-10-CM | POA: Diagnosis not present

## 2021-04-30 DIAGNOSIS — I2510 Atherosclerotic heart disease of native coronary artery without angina pectoris: Secondary | ICD-10-CM | POA: Diagnosis not present

## 2021-04-30 DIAGNOSIS — E1151 Type 2 diabetes mellitus with diabetic peripheral angiopathy without gangrene: Secondary | ICD-10-CM | POA: Diagnosis not present

## 2021-04-30 DIAGNOSIS — N401 Enlarged prostate with lower urinary tract symptoms: Secondary | ICD-10-CM | POA: Diagnosis not present

## 2021-04-30 DIAGNOSIS — M791 Myalgia, unspecified site: Secondary | ICD-10-CM | POA: Diagnosis not present

## 2021-04-30 DIAGNOSIS — I252 Old myocardial infarction: Secondary | ICD-10-CM | POA: Diagnosis not present

## 2021-04-30 DIAGNOSIS — Z7901 Long term (current) use of anticoagulants: Secondary | ICD-10-CM | POA: Diagnosis not present

## 2021-04-30 DIAGNOSIS — G301 Alzheimer's disease with late onset: Secondary | ICD-10-CM | POA: Diagnosis not present

## 2021-04-30 DIAGNOSIS — Z7984 Long term (current) use of oral hypoglycemic drugs: Secondary | ICD-10-CM | POA: Diagnosis not present

## 2021-04-30 DIAGNOSIS — E559 Vitamin D deficiency, unspecified: Secondary | ICD-10-CM | POA: Diagnosis not present

## 2021-04-30 DIAGNOSIS — N138 Other obstructive and reflux uropathy: Secondary | ICD-10-CM | POA: Diagnosis not present

## 2021-04-30 DIAGNOSIS — G20 Parkinson's disease: Secondary | ICD-10-CM | POA: Diagnosis not present

## 2021-04-30 DIAGNOSIS — K219 Gastro-esophageal reflux disease without esophagitis: Secondary | ICD-10-CM | POA: Diagnosis not present

## 2021-05-01 ENCOUNTER — Encounter: Payer: Self-pay | Admitting: Dermatology

## 2021-05-01 ENCOUNTER — Telehealth: Payer: Self-pay | Admitting: Dermatology

## 2021-05-01 NOTE — Telephone Encounter (Signed)
Results

## 2021-05-01 NOTE — Telephone Encounter (Signed)
Pathology results given to patient's wife.  ?

## 2021-05-03 ENCOUNTER — Encounter: Payer: Self-pay | Admitting: Dermatology

## 2021-05-03 NOTE — Progress Notes (Signed)
? ?Follow-Up Visit ?  ?Subjective  ?Joe Murray is a 81 y.o. male who presents for the following: Skin Problem (Here to check lesion on left side neck. X months. Getting bigger. Marcelina Morel wants [place] to recheck CIS on left leg. Patient also has white splotches on his body. They do not itch. X years. ). ? ?Growths on face and neck, check skin ?Location:  ?Duration:  ?Quality:  ?Associated Signs/Symptoms: ?Modifying Factors:  ?Severity:  ?Timing: ?Context:  ? ?Objective  ?Well appearing patient in no apparent distress; mood and affect are within normal limits. ?Left Posterior Mandible ?Heaped up white 8 mm crust ? ? ? ? ? ? ?Right Forearm - Anterior ?Patchy eczematous dermatitis, may be contact and historically improving ? ?Right Temporal Scalp ?Focally eroded waxy pink 9 mm crust, SCCA ? ? ? ? ? ? ? ? ?All skin waist up examined. ? ? ?Assessment & Plan  ? ? ?Neoplasm of uncertain behavior of skin ?Left Posterior Mandible ? ?Skin / nail biopsy ?Type of biopsy: tangential   ?Informed consent: discussed and consent obtained   ?Timeout: patient name, date of birth, surgical site, and procedure verified   ?Anesthesia: the lesion was anesthetized in a standard fashion   ?Anesthetic:  1% lidocaine w/ epinephrine 1-100,000 local infiltration ?Instrument used: flexible razor blade   ?Hemostasis achieved with: ferric subsulfate and electrodesiccation   ?Outcome: patient tolerated procedure well   ?Post-procedure details: wound care instructions given   ? ?Destruction of lesion ?Complexity: simple   ?Destruction method: electrodesiccation and curettage   ?Informed consent: discussed and consent obtained   ?Timeout:  patient name, date of birth, surgical site, and procedure verified ?Anesthesia: the lesion was anesthetized in a standard fashion   ?Anesthetic:  1% lidocaine w/ epinephrine 1-100,000 local infiltration ?Curettage performed in three different directions: Yes   ?Electrodesiccation performed over the curetted area: Yes    ?Curettage cycles:  3 ?Lesion length (cm):  0.8 ?Lesion width (cm):  0.8 ?Margin per side (cm):  0 ?Final wound size (cm):  0.8 ?Hemostasis achieved with:  ferric subsulfate ?Outcome: patient tolerated procedure well with no complications   ?Post-procedure details: wound care instructions given   ? ?Specimen 1 - Surgical pathology ?Differential Diagnosis: bcc vs scc txpbx ? ?Check Margins: No ? ?After shave biopsy the base was curetted and cauterized ? ?Intrinsic eczema ?Right Forearm - Anterior ? ?Will contact me if it worsens ? ?Squamous cell carcinoma of skin of right temple ?Right Temporal Scalp ? ?Skin / nail biopsy ?Type of biopsy: tangential   ?Informed consent: discussed and consent obtained   ?Timeout: patient name, date of birth, surgical site, and procedure verified   ?Anesthesia: the lesion was anesthetized in a standard fashion   ?Anesthetic:  1% lidocaine w/ epinephrine 1-100,000 local infiltration ?Instrument used: flexible razor blade   ?Hemostasis achieved with: ferric subsulfate and electrodesiccation   ?Outcome: patient tolerated procedure well   ?Post-procedure details: wound care instructions given   ? ?Destruction of lesion ?Complexity: simple   ?Destruction method: electrodesiccation and curettage   ?Informed consent: discussed and consent obtained   ?Timeout:  patient name, date of birth, surgical site, and procedure verified ?Anesthesia: the lesion was anesthetized in a standard fashion   ?Anesthetic:  1% lidocaine w/ epinephrine 1-100,000 local infiltration ?Curettage performed in three different directions: Yes   ?Electrodesiccation performed over the curetted area: Yes   ?Curettage cycles:  3 ?Lesion length (cm):  1 ?Lesion width (cm):  1 ?Margin per  side (cm):  0 ?Final wound size (cm):  1 ?Hemostasis achieved with:  ferric subsulfate ?Outcome: patient tolerated procedure well with no complications   ?Post-procedure details: wound care instructions given   ? ?Specimen 2 - Surgical  pathology ?Differential Diagnosis: bcc vs scc txpbx ? ?Check Margins: No ? ?After shave biopsy the base was treated with curettage plus cautery ? ? ? ? ? ?I, Lavonna Monarch, MD, have reviewed all documentation for this visit.  The documentation on 05/03/21 for the exam, diagnosis, procedures, and orders are all accurate and complete. ?

## 2021-05-07 DIAGNOSIS — I252 Old myocardial infarction: Secondary | ICD-10-CM | POA: Diagnosis not present

## 2021-05-07 DIAGNOSIS — Z7984 Long term (current) use of oral hypoglycemic drugs: Secondary | ICD-10-CM | POA: Diagnosis not present

## 2021-05-07 DIAGNOSIS — I10 Essential (primary) hypertension: Secondary | ICD-10-CM | POA: Diagnosis not present

## 2021-05-07 DIAGNOSIS — M7061 Trochanteric bursitis, right hip: Secondary | ICD-10-CM | POA: Diagnosis not present

## 2021-05-07 DIAGNOSIS — E785 Hyperlipidemia, unspecified: Secondary | ICD-10-CM | POA: Diagnosis not present

## 2021-05-07 DIAGNOSIS — M791 Myalgia, unspecified site: Secondary | ICD-10-CM | POA: Diagnosis not present

## 2021-05-07 DIAGNOSIS — I2510 Atherosclerotic heart disease of native coronary artery without angina pectoris: Secondary | ICD-10-CM | POA: Diagnosis not present

## 2021-05-07 DIAGNOSIS — R69 Illness, unspecified: Secondary | ICD-10-CM | POA: Diagnosis not present

## 2021-05-07 DIAGNOSIS — N138 Other obstructive and reflux uropathy: Secondary | ICD-10-CM | POA: Diagnosis not present

## 2021-05-07 DIAGNOSIS — H18519 Endothelial corneal dystrophy, unspecified eye: Secondary | ICD-10-CM | POA: Diagnosis not present

## 2021-05-07 DIAGNOSIS — G20 Parkinson's disease: Secondary | ICD-10-CM | POA: Diagnosis not present

## 2021-05-07 DIAGNOSIS — E1151 Type 2 diabetes mellitus with diabetic peripheral angiopathy without gangrene: Secondary | ICD-10-CM | POA: Diagnosis not present

## 2021-05-07 DIAGNOSIS — E559 Vitamin D deficiency, unspecified: Secondary | ICD-10-CM | POA: Diagnosis not present

## 2021-05-07 DIAGNOSIS — Z7901 Long term (current) use of anticoagulants: Secondary | ICD-10-CM | POA: Diagnosis not present

## 2021-05-07 DIAGNOSIS — K219 Gastro-esophageal reflux disease without esophagitis: Secondary | ICD-10-CM | POA: Diagnosis not present

## 2021-05-07 DIAGNOSIS — N401 Enlarged prostate with lower urinary tract symptoms: Secondary | ICD-10-CM | POA: Diagnosis not present

## 2021-05-07 DIAGNOSIS — G301 Alzheimer's disease with late onset: Secondary | ICD-10-CM | POA: Diagnosis not present

## 2021-05-12 ENCOUNTER — Telehealth: Payer: Self-pay

## 2021-05-12 ENCOUNTER — Other Ambulatory Visit (INDEPENDENT_AMBULATORY_CARE_PROVIDER_SITE_OTHER): Payer: Medicare HMO

## 2021-05-12 DIAGNOSIS — E673 Hypervitaminosis D: Secondary | ICD-10-CM | POA: Diagnosis not present

## 2021-05-12 LAB — VITAMIN D 25 HYDROXY (VIT D DEFICIENCY, FRACTURES): VITD: 104.01 ng/mL (ref 30.00–100.00)

## 2021-05-12 NOTE — Telephone Encounter (Signed)
Message sent to PCP for advise ?

## 2021-05-12 NOTE — Telephone Encounter (Signed)
See my Result Note  

## 2021-05-14 DIAGNOSIS — Z7984 Long term (current) use of oral hypoglycemic drugs: Secondary | ICD-10-CM | POA: Diagnosis not present

## 2021-05-14 DIAGNOSIS — E559 Vitamin D deficiency, unspecified: Secondary | ICD-10-CM | POA: Diagnosis not present

## 2021-05-14 DIAGNOSIS — E785 Hyperlipidemia, unspecified: Secondary | ICD-10-CM | POA: Diagnosis not present

## 2021-05-14 DIAGNOSIS — G20 Parkinson's disease: Secondary | ICD-10-CM | POA: Diagnosis not present

## 2021-05-14 DIAGNOSIS — M791 Myalgia, unspecified site: Secondary | ICD-10-CM | POA: Diagnosis not present

## 2021-05-14 DIAGNOSIS — H18519 Endothelial corneal dystrophy, unspecified eye: Secondary | ICD-10-CM | POA: Diagnosis not present

## 2021-05-14 DIAGNOSIS — I252 Old myocardial infarction: Secondary | ICD-10-CM | POA: Diagnosis not present

## 2021-05-14 DIAGNOSIS — G301 Alzheimer's disease with late onset: Secondary | ICD-10-CM | POA: Diagnosis not present

## 2021-05-14 DIAGNOSIS — R69 Illness, unspecified: Secondary | ICD-10-CM | POA: Diagnosis not present

## 2021-05-14 DIAGNOSIS — I2510 Atherosclerotic heart disease of native coronary artery without angina pectoris: Secondary | ICD-10-CM | POA: Diagnosis not present

## 2021-05-14 DIAGNOSIS — N401 Enlarged prostate with lower urinary tract symptoms: Secondary | ICD-10-CM | POA: Diagnosis not present

## 2021-05-14 DIAGNOSIS — M7061 Trochanteric bursitis, right hip: Secondary | ICD-10-CM | POA: Diagnosis not present

## 2021-05-14 DIAGNOSIS — N138 Other obstructive and reflux uropathy: Secondary | ICD-10-CM | POA: Diagnosis not present

## 2021-05-14 DIAGNOSIS — K219 Gastro-esophageal reflux disease without esophagitis: Secondary | ICD-10-CM | POA: Diagnosis not present

## 2021-05-14 DIAGNOSIS — I10 Essential (primary) hypertension: Secondary | ICD-10-CM | POA: Diagnosis not present

## 2021-05-14 DIAGNOSIS — Z7901 Long term (current) use of anticoagulants: Secondary | ICD-10-CM | POA: Diagnosis not present

## 2021-05-14 DIAGNOSIS — E1151 Type 2 diabetes mellitus with diabetic peripheral angiopathy without gangrene: Secondary | ICD-10-CM | POA: Diagnosis not present

## 2021-05-15 ENCOUNTER — Telehealth: Payer: Self-pay

## 2021-05-15 NOTE — Telephone Encounter (Signed)
Patient had critical Vit D levels on 05/12/2021.    Please advise ?

## 2021-05-15 NOTE — Telephone Encounter (Signed)
LVM for pt/pt wife to call back for PCP response. ?

## 2021-05-15 NOTE — Telephone Encounter (Signed)
Wife of patient called to get clarification of lab results and stated patient is not taking anything with vitamin D that she knows of and would like to know why patients levels are still high. ?

## 2021-05-15 NOTE — Telephone Encounter (Signed)
I understand. I don't know why it would be high, but the main treatment is drinking lots of water every day. Our main concern with excess vitamin D in the body is it can cause the calcium levels to be too high, and this causes all sorts of problems. However his calcium levels have always been normal, so the vitamin D does not seem to be affecting him. We will continue to monitor his levels of calcium and vitamin D every 6 months.  ?

## 2021-05-15 NOTE — Telephone Encounter (Signed)
Wife Joe Murray given PCP response & verb understanding. Declines to schedule 6 mo f/u at this time. ?

## 2021-05-19 IMAGING — CT CT CERVICAL SPINE W/O CM
3 of 4 series · 13 of 33 positions shown, 16 images · non-contrast
Comparison: Cervical spine radiographs 01/10/2018. MRI brain
04/16/2018

CLINICAL DATA: Patient fell, striking head. Loss of consciousness
for 30 seconds.

EXAM:
CT HEAD WITHOUT CONTRAST
CT CERVICAL SPINE WITHOUT CONTRAST
TECHNIQUE: Multidetector CT imaging of the head and cervical spine was
performed following the standard protocol without intravenous
contrast. Multiplanar CT image reconstructions of the cervical spine
were also generated.

[Series 8: sag bone · sagittal · 0.47mm/px · 5 of 89 slices shown, 6 images]
[im 30/89  bone]
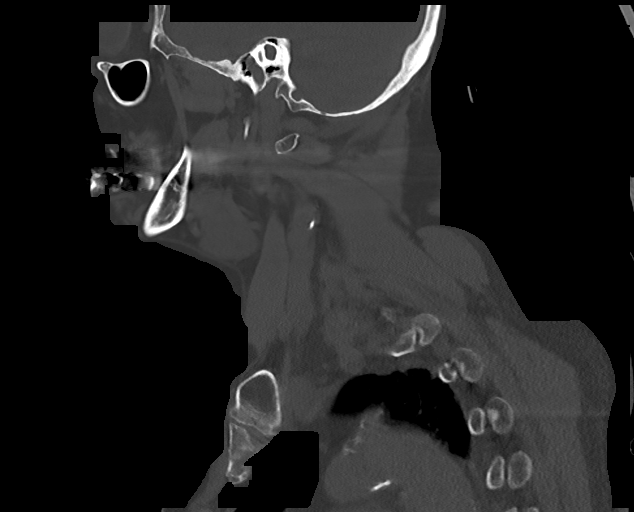
[im 37/89  bone]
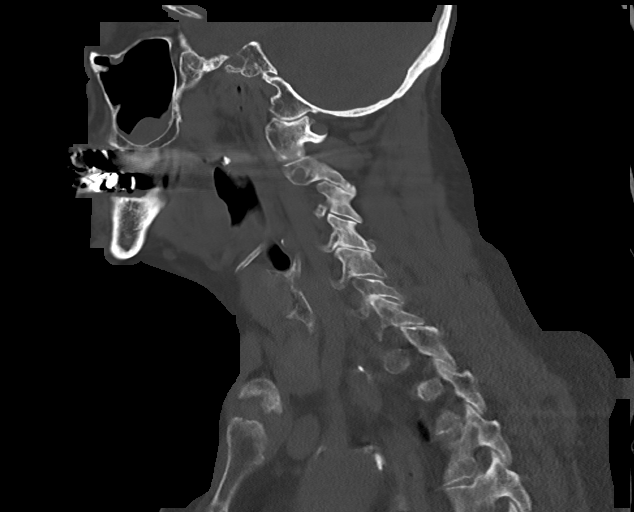
[im 45/89  soft-tissue]
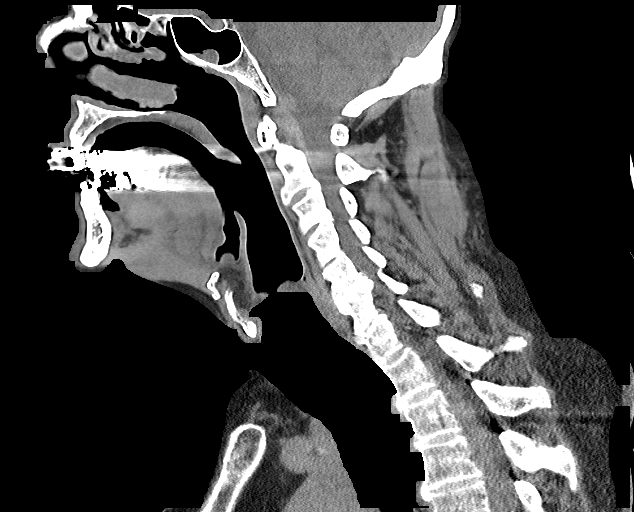
[im 45/89  bone]
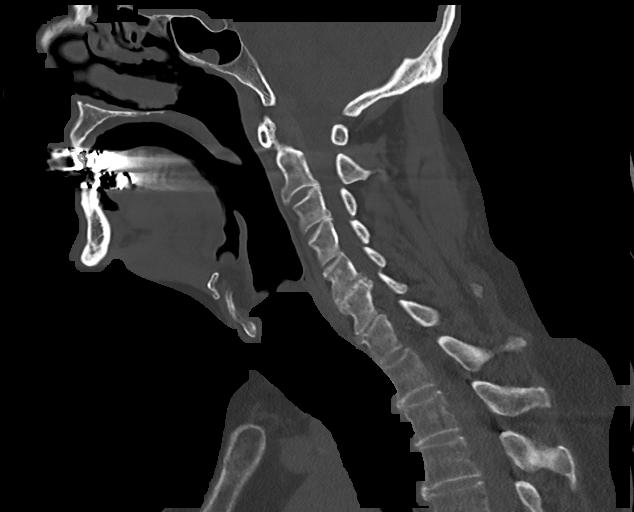
[im 52/89  bone]
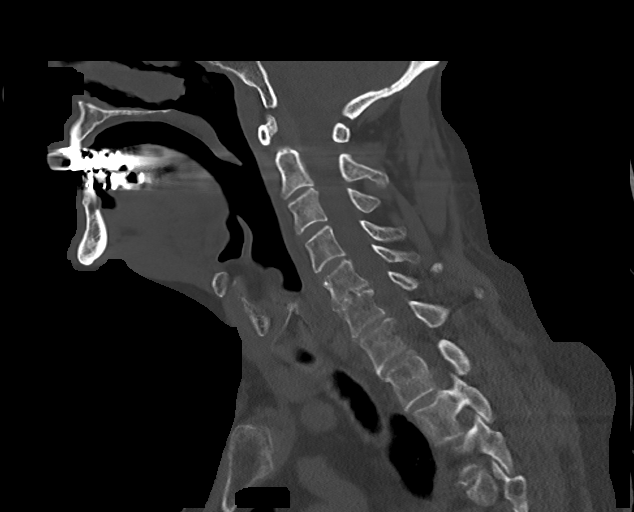
[im 59/89  bone]
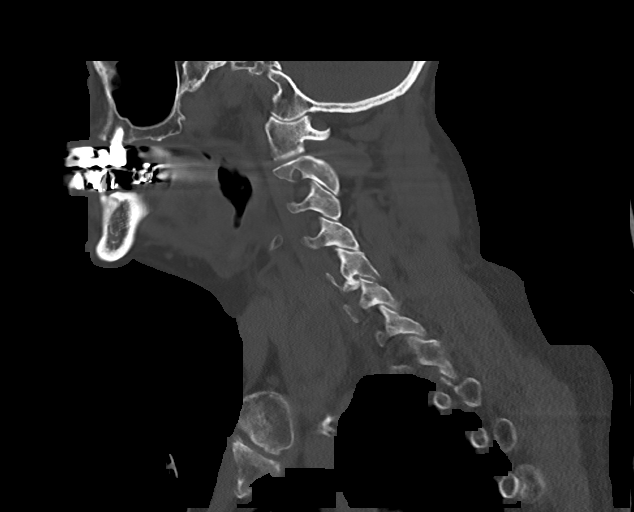

[Series 9: cor bone · coronal · 0.43mm/px · 3 of 101 slices shown]
[im 23/101  bone]
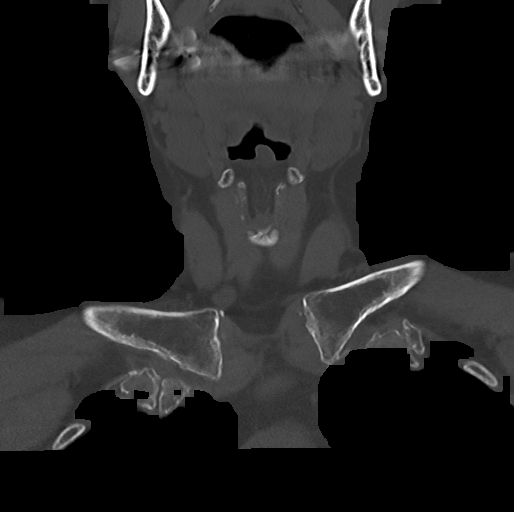
[im 41/101  bone]
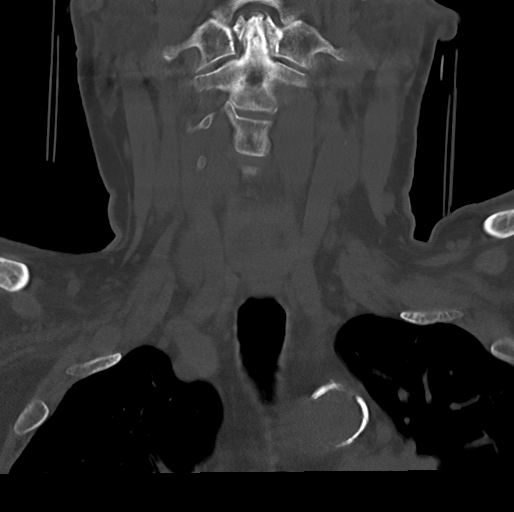
[im 60/101  bone]
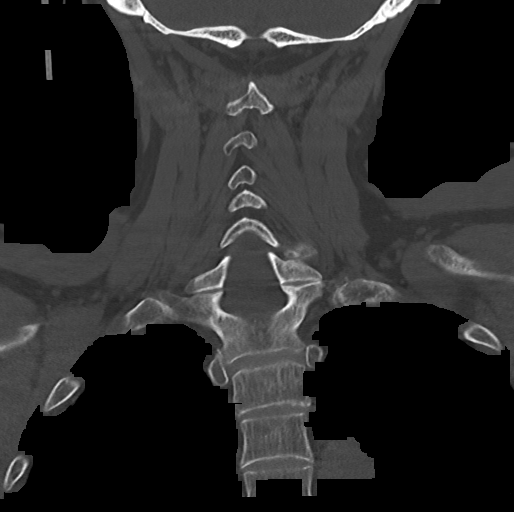

[Series 10: orthogonal axials · axial · 0.21mm/px · z∈[-326,-195]mm · 5 of 115 slices shown, 7 images]
[im 20/115  soft-tissue]
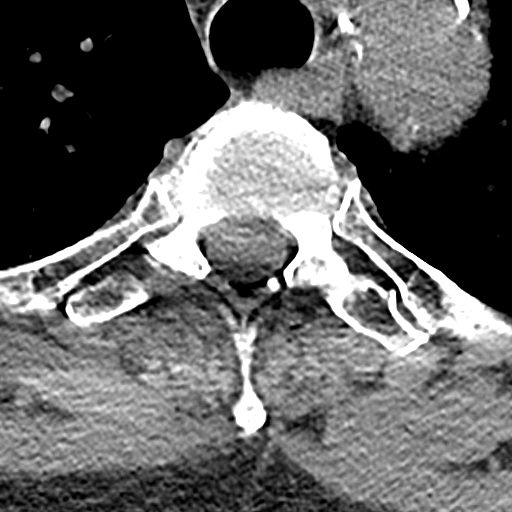
[im 20/115  bone]
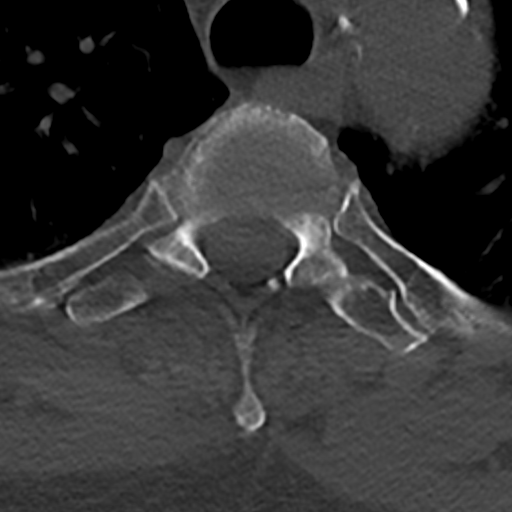
[im 39/115  bone]
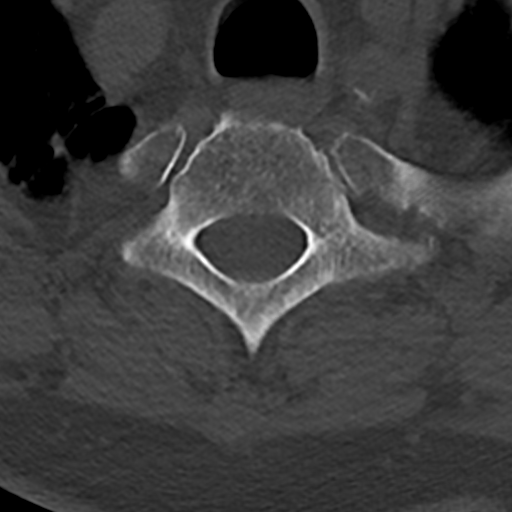
[im 58/115  bone]
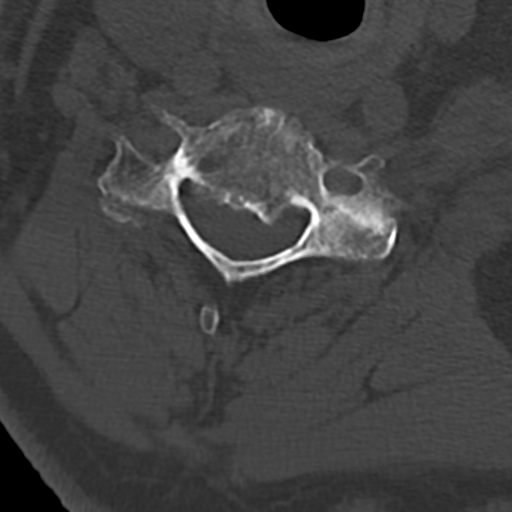
[im 77/115  bone]
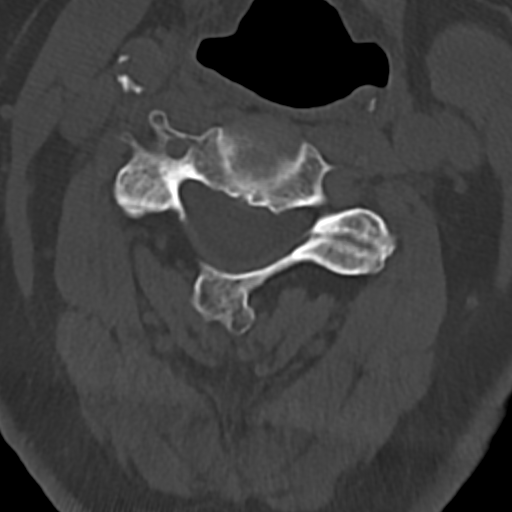
[im 96/115  soft-tissue]
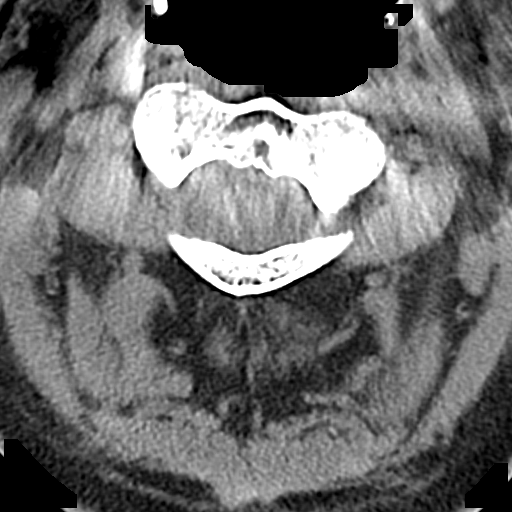
[im 96/115  bone]
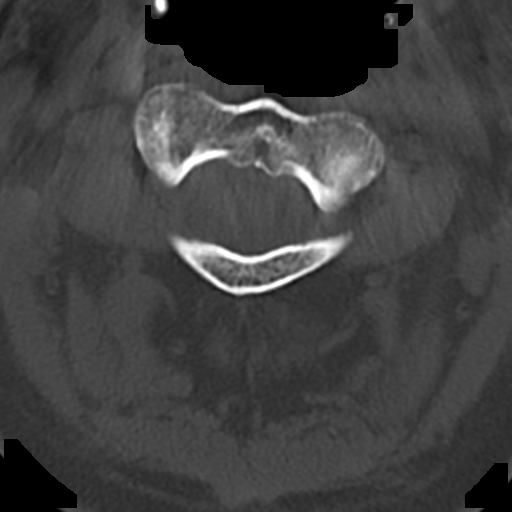

[13 of 33 positions shown; findings below may reference images not displayed]

FINDINGS: CT HEAD FINDINGS

Brain: No evidence of acute infarction, hemorrhage, hydrocephalus,
extra-axial collection or mass lesion/mass effect. Diffuse cerebral
atrophy. Patchy low-attenuation changes in the deep white matter
consistent with small vessel ischemia.

Vascular: Intracranial arterial vascular calcifications are present.

Skull: Calvarium appears intact.

Sinuses/Orbits: Mild mucosal thickening in the paranasal sinuses. No
acute air-fluid levels. Mastoid air cells are clear.

Other: None.

CT CERVICAL SPINE FINDINGS

Alignment: Straightening of the usual cervical lordosis without
anterior subluxation. This is likely positional but muscle spasm
could also have this appearance. C1-2 articulation appears intact.

Skull base and vertebrae: Skull base appears intact. No vertebral
compression deformities. No focal bone lesion or bone destruction.
Bone cortex appears intact.

Soft tissues and spinal canal: No prevertebral soft tissue swelling.
No abnormal paraspinal soft tissue mass or infiltration.

Disc levels: Degenerative changes with disc space narrowing and
endplate hypertrophic change most prominent at C5-6, C6-7, and C7-T1
levels.

Upper chest: Emphysematous changes in the lung apices.

Other: None.
IMPRESSION: 1. No acute intracranial abnormalities. Chronic atrophy and small
vessel ischemic changes.
2. Nonspecific straightening of usual cervical lordosis.
Degenerative changes in the cervical spine. No acute displaced
fractures identified.

## 2021-05-19 IMAGING — CT CT HEAD W/O CM
4 series · 16 of 47 positions shown, 18 images · non-contrast
Comparison: Cervical spine radiographs 01/10/2018. MRI brain
04/16/2018

CLINICAL DATA: Patient fell, striking head. Loss of consciousness
for 30 seconds.

EXAM:
CT HEAD WITHOUT CONTRAST
CT CERVICAL SPINE WITHOUT CONTRAST
TECHNIQUE: Multidetector CT imaging of the head and cervical spine was
performed following the standard protocol without intravenous
contrast. Multiplanar CT image reconstructions of the cervical spine
were also generated.

[Series 3: head wo · axial · 0.41mm/px · z∈[-158,-33]mm · 7 of 35 slices shown, 9 images]
[im 5/35  brain]
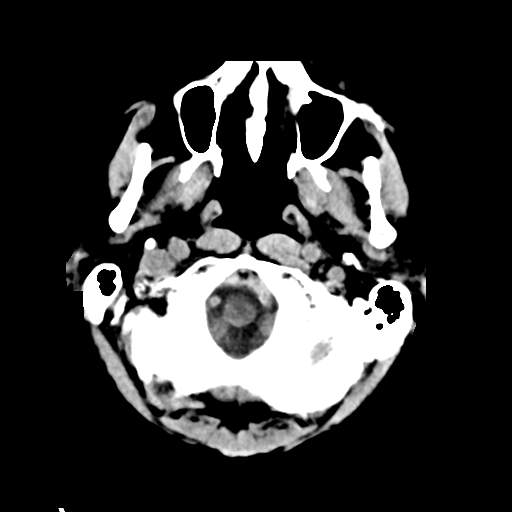
[im 5/35  bone]
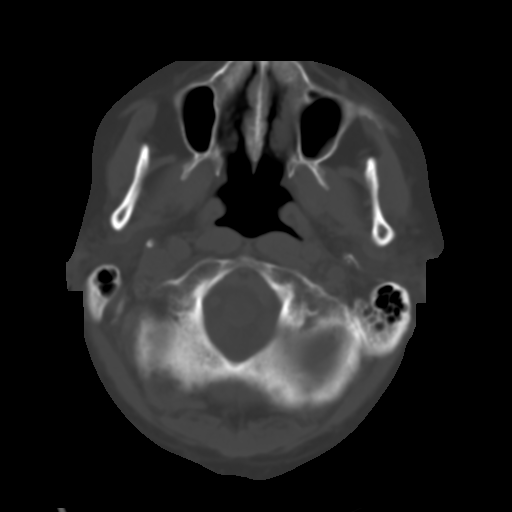
[im 9/35  brain]
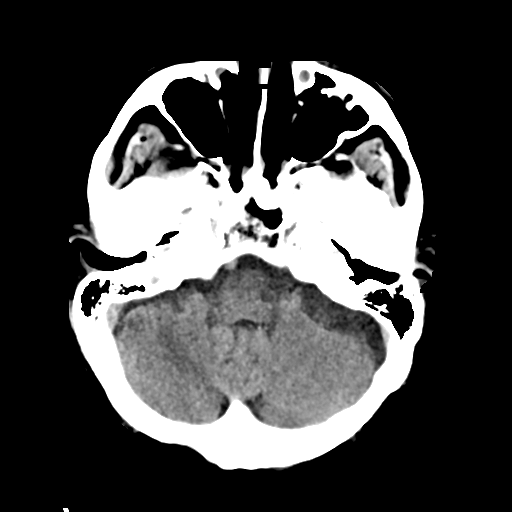
[im 13/35  brain]
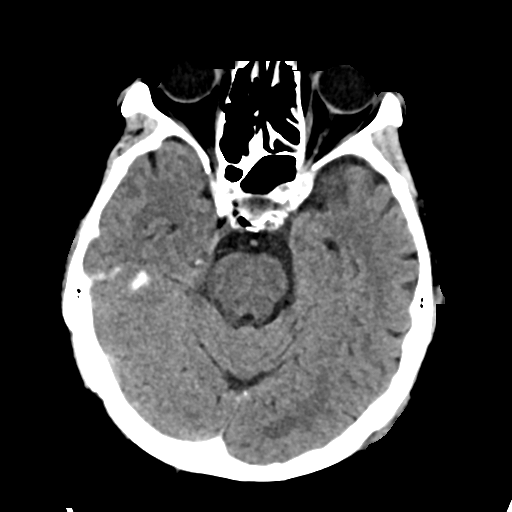
[im 18/35  brain]
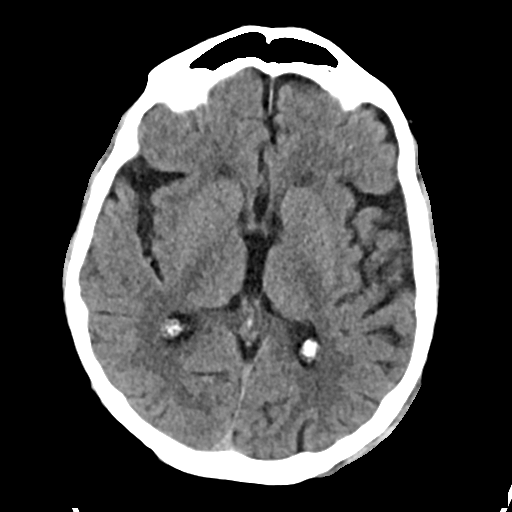
[im 22/35  brain]
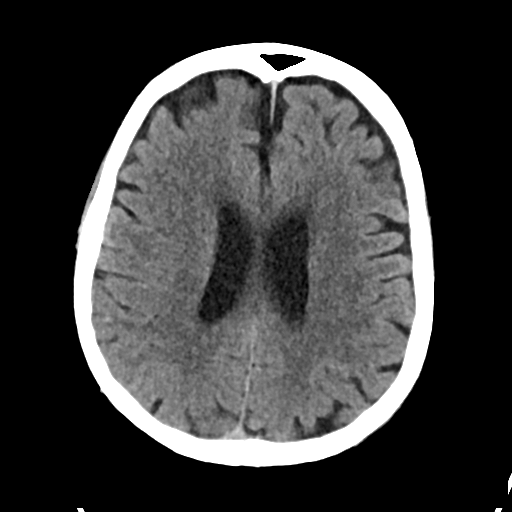
[im 22/35  bone]
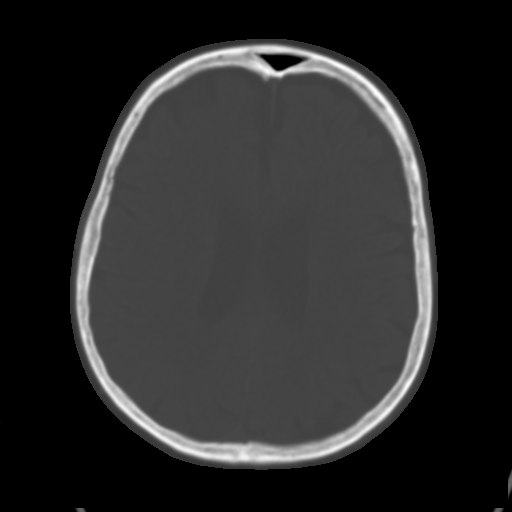
[im 26/35  brain]
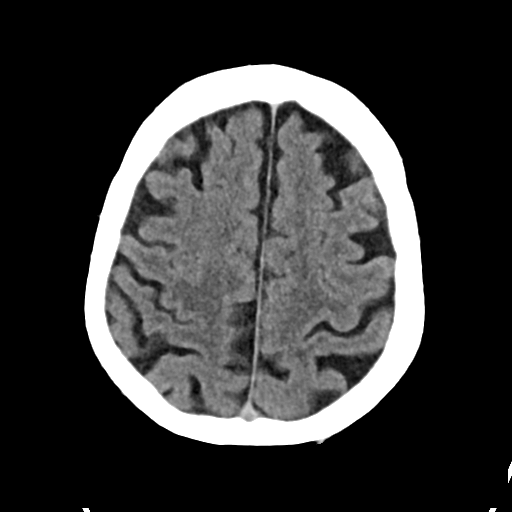
[im 30/35  brain]
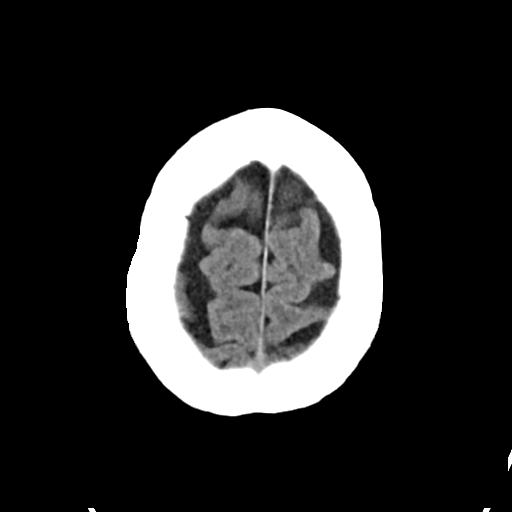

[Series 4: head bone · axial · 0.41mm/px · z∈[-162,-126]mm · 3 of 88 slices shown]
[im 9/88  bone]
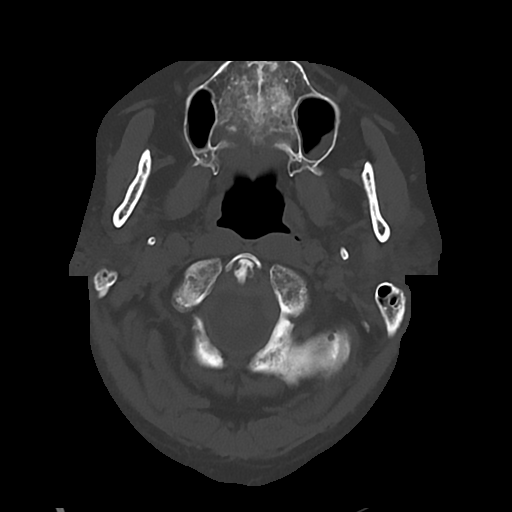
[im 18/88  bone]
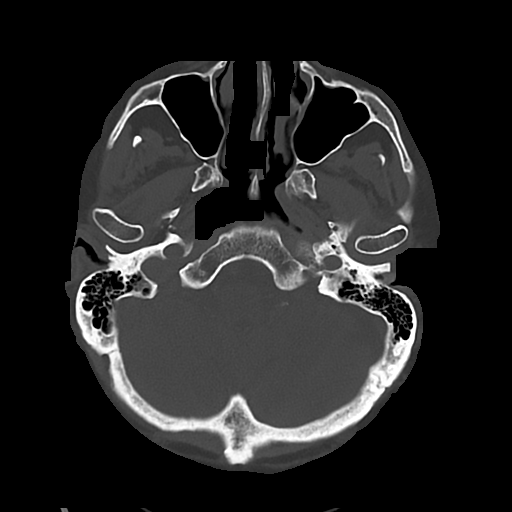
[im 27/88  bone]
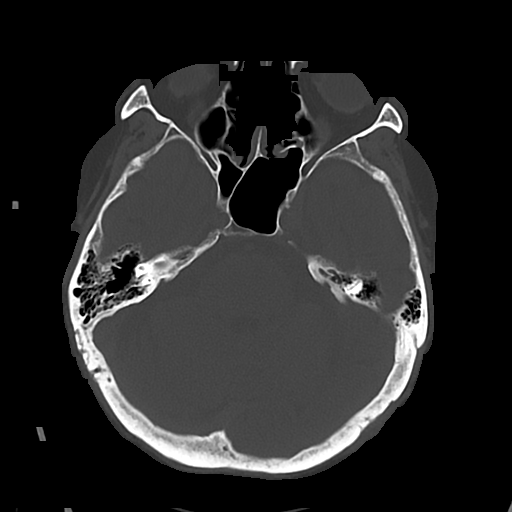

[Series 5: cor soft · coronal · 0.43mm/px · 3 of 94 slices shown]
[im 32/94  brain]
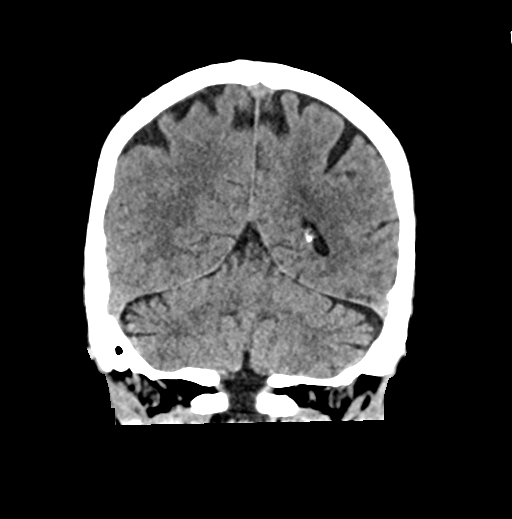
[im 42/94  brain]
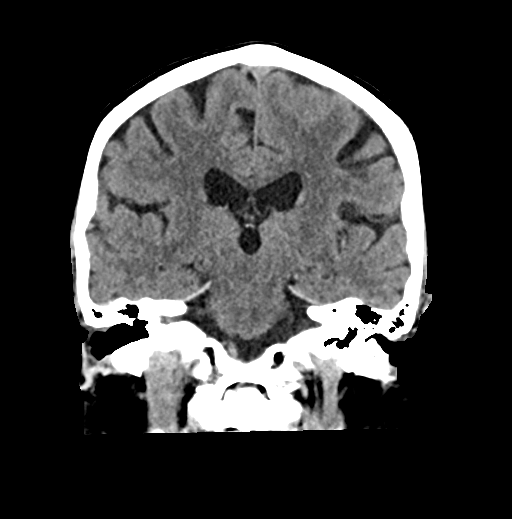
[im 52/94  brain]
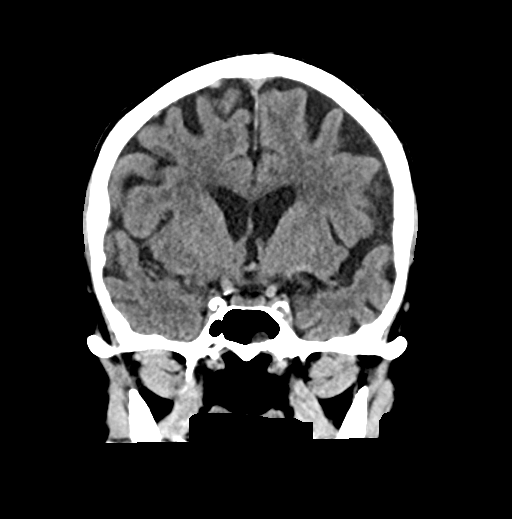

[Series 6: sag soft · sagittal · 0.42mm/px · 3 of 64 slices shown]
[im 22/64  brain]
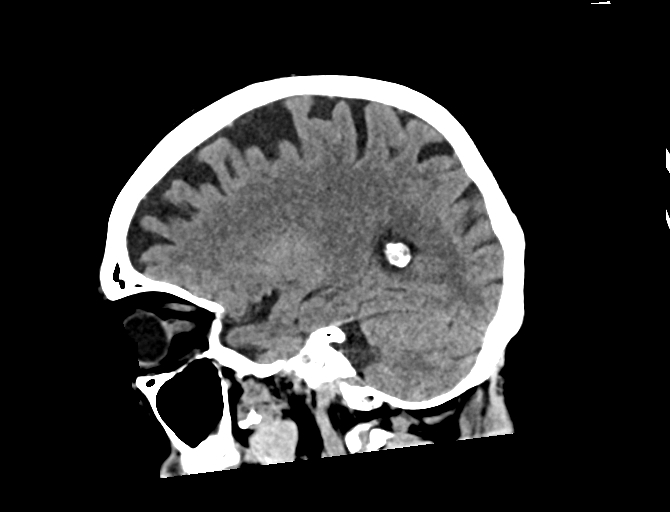
[im 32/64  brain]
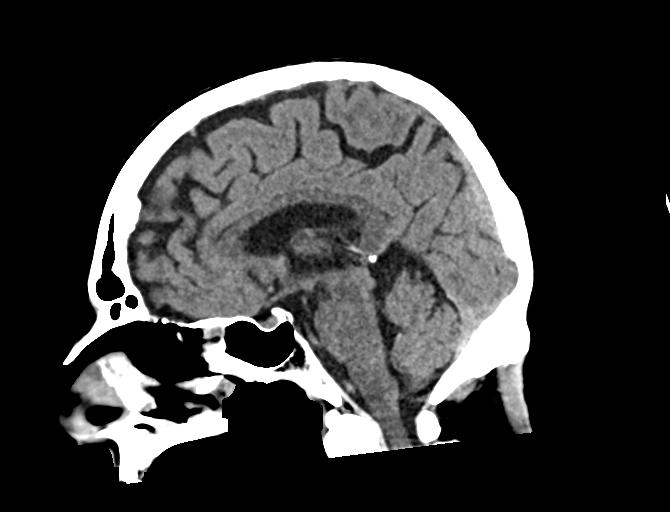
[im 43/64  brain]
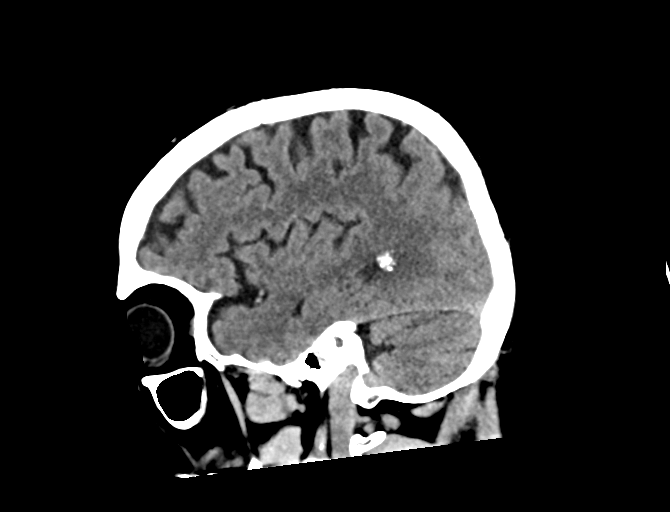

[16 of 47 positions shown; findings below may reference images not displayed]

FINDINGS: CT HEAD FINDINGS

Brain: No evidence of acute infarction, hemorrhage, hydrocephalus,
extra-axial collection or mass lesion/mass effect. Diffuse cerebral
atrophy. Patchy low-attenuation changes in the deep white matter
consistent with small vessel ischemia.

Vascular: Intracranial arterial vascular calcifications are present.

Skull: Calvarium appears intact.

Sinuses/Orbits: Mild mucosal thickening in the paranasal sinuses. No
acute air-fluid levels. Mastoid air cells are clear.

Other: None.

CT CERVICAL SPINE FINDINGS

Alignment: Straightening of the usual cervical lordosis without
anterior subluxation. This is likely positional but muscle spasm
could also have this appearance. C1-2 articulation appears intact.

Skull base and vertebrae: Skull base appears intact. No vertebral
compression deformities. No focal bone lesion or bone destruction.
Bone cortex appears intact.

Soft tissues and spinal canal: No prevertebral soft tissue swelling.
No abnormal paraspinal soft tissue mass or infiltration.

Disc levels: Degenerative changes with disc space narrowing and
endplate hypertrophic change most prominent at C5-6, C6-7, and C7-T1
levels.

Upper chest: Emphysematous changes in the lung apices.

Other: None.
IMPRESSION: 1. No acute intracranial abnormalities. Chronic atrophy and small
vessel ischemic changes.
2. Nonspecific straightening of usual cervical lordosis.
Degenerative changes in the cervical spine. No acute displaced
fractures identified.

## 2021-06-05 DIAGNOSIS — Z0100 Encounter for examination of eyes and vision without abnormal findings: Secondary | ICD-10-CM | POA: Diagnosis not present

## 2021-06-10 ENCOUNTER — Ambulatory Visit: Payer: Medicare HMO | Admitting: Dermatology

## 2021-06-10 DIAGNOSIS — Z85828 Personal history of other malignant neoplasm of skin: Secondary | ICD-10-CM

## 2021-06-10 DIAGNOSIS — C44219 Basal cell carcinoma of skin of left ear and external auricular canal: Secondary | ICD-10-CM

## 2021-06-10 DIAGNOSIS — D485 Neoplasm of uncertain behavior of skin: Secondary | ICD-10-CM

## 2021-06-10 NOTE — Patient Instructions (Signed)

## 2021-06-28 ENCOUNTER — Encounter: Payer: Self-pay | Admitting: Dermatology

## 2021-06-28 NOTE — Progress Notes (Signed)
? ?  Follow-Up Visit ?  ?Subjective  ?Joe Murray is a 81 y.o. male who presents for the following: Follow-up (F/U /right temporal scalp/WELL DIFFERENTIATED SQUAMOUS CELL CARCINOMA WITH SUPERFICIAL INFILTRATION, INFLAMED, PERIPHERAL/MARGIN INVOLVED). ? ?Recheck right temple plus new spot left ear ?Location:  ?Duration:  ?Quality:  ?Associated Signs/Symptoms: ?Modifying Factors:  ?Severity:  ?Timing: ?Context:  ? ?Objective  ?Well appearing patient in no apparent distress; mood and affect are within normal limits. ?Left Superior Helix ?1cm pearly papule with focal erosion ? ? ? ? ?Right Temple ?Although clinically there is no sign of residual skin cancer, there are diffuse actinic keratoses and the patient was informed that with a positive margin there is a possibility of local recurrence. ? ? ? ?A focused examination was performed including head and neck. Relevant physical exam findings are noted in the Assessment and Plan. ? ? ?Assessment & Plan  ? ? ?Basal cell carcinoma of skin of left ear and external auricular canal ?Left Superior Helix ? ?Skin / nail biopsy ?Type of biopsy: tangential   ?Informed consent: discussed and consent obtained   ?Timeout: patient name, date of birth, surgical site, and procedure verified   ?Anesthesia: the lesion was anesthetized in a standard fashion   ?Anesthetic:  1% lidocaine w/ epinephrine 1-100,000 local infiltration ?Instrument used: flexible razor blade   ?Hemostasis achieved with: aluminum chloride and electrodesiccation   ?Outcome: patient tolerated procedure well   ?Post-procedure details: wound care instructions given   ? ?Destruction of lesion ?Complexity: simple   ?Destruction method: electrodesiccation and curettage   ?Informed consent: discussed and consent obtained   ?Timeout:  patient name, date of birth, surgical site, and procedure verified ?Anesthesia: the lesion was anesthetized in a standard fashion   ?Anesthetic:  1% lidocaine w/ epinephrine 1-100,000 local  infiltration ?Curettage performed in three different directions: Yes   ?Electrodesiccation performed over the curetted area: Yes   ?Curettage cycles:  3 ?Lesion length (cm):  1.7 ?Lesion width (cm):  1 ?Margin per side (cm):  0 ?Final wound size (cm):  1.7 ?Hemostasis achieved with:  aluminum chloride ?Outcome: patient tolerated procedure well with no complications   ?Post-procedure details: wound care instructions given   ? ?Specimen 1 - Surgical pathology ?Differential Diagnosis: bcc scc tx with bx  ? ?Check Margins: No ? ?After shave biopsy the base lesion was treated with curettage plus cautery ? ?Personal history of skin cancer ?Right Temple ? ?Patient to return if there is a new growth or bleeding. ? ? ? ? ? ?I, Lavonna Monarch, MD, have reviewed all documentation for this visit.  The documentation on 06/28/21 for the exam, diagnosis, procedures, and orders are all accurate and complete. ?

## 2021-07-08 ENCOUNTER — Telehealth: Payer: Self-pay | Admitting: Pharmacist

## 2021-07-08 NOTE — Chronic Care Management (AMB) (Signed)
? ? ?  Chronic Care Management ?Pharmacy Assistant  ? ?Name: Joe Murray  MRN: [id number] DOB: [birth year] ? ?07/09/2021 APPOINTMENT REMINDER ? ?[name] wife was reminded to have all medications, supplements and any blood glucose and blood pressure readings available for review with Jeni Salles, Pharm. D, at his telephone visit on 07/09/2021 at 9:30. ? ?Note: Patient's wife will be on the call with him, she states he has early dementia.  ? ?Care Gaps: ?AWV - completed 03/27/2021 ?Last BP - 124/82 on 03/11/2021 ?Last A1C - 6.9 on 11/07/2021 ?Foot exam - never done ?Malb - over due ?Eye exam - overdue ?A1C - overdue ?Tdap - postponed ? ?Star Rating Drug: ?Glipizide 5 mg - last filled 04/22/2021 90 DS at [facility] ? ?Any gaps in medications fill history? No ? ?Gennie Alma CMA  ?Clinical Pharmacist Assistant ?[phone] ? ?

## 2021-07-09 ENCOUNTER — Ambulatory Visit (INDEPENDENT_AMBULATORY_CARE_PROVIDER_SITE_OTHER): Payer: Medicare HMO | Admitting: Pharmacist

## 2021-07-09 ENCOUNTER — Telehealth: Payer: Self-pay | Admitting: Family Medicine

## 2021-07-09 DIAGNOSIS — I10 Essential (primary) hypertension: Secondary | ICD-10-CM

## 2021-07-09 DIAGNOSIS — E785 Hyperlipidemia, unspecified: Secondary | ICD-10-CM

## 2021-07-09 NOTE — Telephone Encounter (Signed)
Done

## 2021-07-09 NOTE — Progress Notes (Signed)
Chronic Care Management Pharmacy Note  07/09/2021 Name:  Joe Murray MRN:  [id number] DOB:  02-21-1941  Summary: BP at goal < 140/90 per home readings LDL at goal < 70  Recommendations/Changes made from today's visit: -Recommended monitoring BP at home at least once weekly -Recommended monitoring blood sugars a couple times a week -Recommended trial of 2 or 3 mg of melatonin for nights he has trouble sleeping  Plan: BP/DM assessment in 1-2 months CPE scheduled Follow up in 6 months   Subjective: Joe Murray is an 81 y.o. year old male who is a primary patient of Laurey Morale, MD.  The CCM team was consulted for assistance with disease management and care coordination needs.    Engaged with patient by telephone for follow up visit in response to provider referral for pharmacy case management and/or care coordination services.   Consent to Services:  The patient was given information about Chronic Care Management services, agreed to services, and gave verbal consent prior to initiation of services.  Please see initial visit note for detailed documentation.   Patient Care Team: Laurey Morale, MD as PCP - General Ellyn Hack Leonie Green, MD as PCP - Cardiology (Cardiology) Cameron Sprang, MD as Consulting Physician (Neurology) Lavonna Monarch, MD as Consulting Physician (Dermatology) Viona Gilmore, [facility] as Pharmacist (Pharmacist)  Recent office visits: 03/27/21 Rolene Arbour, LPN: Patient presented for AWV.  11/07/2020 Alysia Penna MD (PCP) - Patient was seen for Essential hypertension and additional issues. No medication changes.  No follow up noted.   07/05/2020 Alysia Penna MD (PCP) - Patient was seen for Ingrown toenail of both feet. Referral to Podiatry. No medication changes. No follow up noted.  Recent consult visits: 04/23/21 Lavonna Monarch, MD (dermatology): Patient presented for basal cell carcinoma follow up. Follow up in 1 month.  06/10/21 Lavonna Monarch, MD  (dermatology): Patient presented for basal cell carcinoma follow up. Follow up in 3 months.  04/21/21 Daylene Katayama, DPM (podiatry): Patient was seen for Pain due to onychomycosis and an additional issue. No medication changes.   03/11/21 Sharene Butters, PA-C (neurology) - Patient was seen for Alzheimers demential and additional issue. Decreased carbidopa/levodopa to 1 tablet in AM, 1 tablet at supper.   11/13/2020 Marilynne Halsted, MD (Eye center) - Patient was seen for follow up corneal transplant. Started Pred Forte 1% once per day and Systane Ultra 3-4 times daily. No follow up noted. Follow up in 6 months.  Hospital visits: None in previous 6 months   Objective:  Lab Results  Component Value Date   CREATININE 1.46 11/07/2020   BUN 21 11/07/2020   GFR 45.06 (L) 11/07/2020   GFRNONAA 55 (L) 06/01/2019   GFRAA 64 06/01/2019   NA 137 11/07/2020   K 4.7 11/07/2020   CALCIUM 9.7 11/07/2020   CO2 27 11/07/2020   GLUCOSE 148 (H) 11/07/2020    Lab Results  Component Value Date/Time   HGBA1C 6.9 (H) 11/07/2020 10:15 AM   HGBA1C 6.5 (H) 11/07/2019 10:48 AM   GFR 45.06 (L) 11/07/2020 10:15 AM   GFR 50.15 (L) 11/09/2018 08:20 AM   MICROALBUR 1.8 08/09/2015 09:07 AM   MICROALBUR 0.7 06/27/2012 09:45 AM    Last diabetic Eye exam: No results found for: HMDIABEYEEXA  Last diabetic Foot exam: No results found for: HMDIABFOOTEX   Lab Results  Component Value Date   CHOL 139 11/07/2020   HDL 56.40 11/07/2020   LDLCALC 51 11/07/2020   LDLDIRECT 85.5 06/27/2012  TRIG 158.0 (H) 11/07/2020   CHOLHDL 2 11/07/2020       Latest Ref Rng & Units 11/07/2020   10:15 AM 11/07/2019   10:48 AM 06/01/2019    8:24 AM  Hepatic Function  Total Protein 6.0 - 8.3 g/dL 7.1   6.6   6.6    Albumin 3.5 - 5.2 g/dL 4.4    4.4    AST 0 - 37 U/L '12   19   18    ' ALT 0 - 53 U/L '12   13   14    ' Alk Phosphatase 39 - 117 U/L 55    55    Total Bilirubin 0.2 - 1.2 mg/dL 0.6   0.5   0.3    Bilirubin, Direct 0.0  - 0.3 mg/dL 0.1   0.1       Lab Results  Component Value Date/Time   TSH 2.58 11/07/2020 10:15 AM   TSH 1.57 11/07/2019 10:48 AM       Latest Ref Rng & Units 11/07/2020   10:15 AM 11/07/2019   10:48 AM 11/09/2018    8:20 AM  CBC  WBC 4.0 - 10.5 K/uL 7.2   7.2   6.1    Hemoglobin 13.0 - 17.0 g/dL 15.1   14.8   14.7    Hematocrit 39.0 - 52.0 % 45.5   44.4   43.9    Platelets 150.0 - 400.0 K/uL 199.0   204   191.0      Lab Results  Component Value Date/Time   VD25OH 104.01 (HH) 05/12/2021 09:16 AM   VD25OH 109.14 ([place]) 02/10/2021 09:51 AM    Clinical ASCVD: Yes  The ASCVD Risk score (Arnett DK, et al., 2019) failed to calculate for the following reasons:   The 2019 ASCVD risk score is only valid for ages 82 to 82   The patient has a prior MI or stroke diagnosis       03/27/2021   10:57 AM 03/26/2020   10:41 AM 08/27/2017    8:28 AM  Depression screen PHQ 2/9  Decreased Interest 0 0 0  Down, Depressed, Hopeless 0 0 0  PHQ - 2 Score 0 0 0      Social History   Tobacco Use  Smoking Status Every Day   Types: Cigars   Start date: 07/06/1952  Smokeless Tobacco Never  Tobacco Comments   1-2 per day; he says "I really don't inhale"   BP Readings from Last 3 Encounters:  03/27/21 122/60  03/11/21 124/82  01/06/21 134/86   Pulse Readings from Last 3 Encounters:  03/11/21 72  01/06/21 (!) 50  11/07/20 (!) 48   Wt Readings from Last 3 Encounters:  03/27/21 215 lb 8 oz (97.8 kg)  03/11/21 213 lb (96.6 kg)  01/06/21 211 lb 9.6 oz (96 kg)   BMI Readings from Last 3 Encounters:  03/27/21 30.92 kg/m  03/11/21 31.45 kg/m  01/06/21 31.25 kg/m    Assessment/Interventions: Review of patient past medical history, allergies, medications, health status, including review of consultants reports, laboratory and other test data, was performed as part of comprehensive evaluation and provision of chronic care management services.   SDOH:  (Social Determinants of Health)  assessments and interventions performed: Yes   SDOH Screenings   Alcohol Screen: Low Risk    Last Alcohol Screening Score (AUDIT): 1  Depression (PHQ2-9): Low Risk    PHQ-2 Score: 0  Financial Resource Strain: Low Risk    Difficulty of Paying  Living Expenses: Not hard at all  Food Insecurity: No Food Insecurity   Worried About Charity fundraiser in the Last Year: Never true   Ran Out of Food in the Last Year: Never true  Housing: Low Risk    Last Housing Risk Score: 0  Physical Activity: Insufficiently Active   Days of Exercise per Week: 2 days   Minutes of Exercise per Session: 30 min  Social Connections: Socially Integrated   Frequency of Communication with Friends and Family: More than three times a week   Frequency of Social Gatherings with Friends and Family: More than three times a week   Attends Religious Services: 1 to 4 times per year   Active Member of [employer] or Organizations: Yes   Attends Archivist Meetings: 1 to 4 times per year   Marital Status: Married  Stress: No Stress Concern Present   Feeling of Stress : Not at all  Tobacco Use: High Risk   Smoking Tobacco Use: Every Day   Smokeless Tobacco Use: Never   Passive Exposure: Not on file  Transportation Needs: No Transportation Needs   Lack of Transportation (Medical): No   Lack of Transportation (Non-Medical): No    CCM Care Plan  Allergies  Allergen Reactions   Beta Adrenergic Blockers Other (See Comments)    chronotropic incompetence   Praluent [Alirocumab]     Diffuse Rash - improved when stopping & taking Benadryl   Sulfonamide Derivatives     unknown   Statins Other (See Comments)    REACTION: myalgias    Medications Reviewed Today     Reviewed by Viona Gilmore, [facility] (Pharmacist) on 07/09/21 at [phone]  Med List Status: <None>   Medication Order Taking? Sig Documenting Provider Last Dose Status Informant  aspirin EC 81 MG tablet [id number]  Take 81 mg by mouth daily. [provider]  Active   carbidopa-levodopa (SINEMET IR) 25-100 MG tablet [id number]  Take 1 tablet by mouth in the morning and at bedtime. Rondel Jumbo, PA-C  Active   glipiZIDE (GLUCOTROL) 5 MG tablet [id number] Yes TAKE 1 TABLET BY MOUTH TWICE DAILY BEFORE A MEAL Laurey Morale, MD Taking Active   Lancets (BD LANCET ULTRAFINE 30G) MISC [id number]  USE ONE  TO CHECK GLUCOSE ONCE DAILY Laurey Morale, MD  Active Spouse/Significant Other  MAGNESIUM CHLORIDE PO [id number]  Take 250 mg by mouth daily. [provider]  Active Spouse/Significant Other  memantine (NAMENDA) 10 MG tablet [id number]  Take 1 tablet by mouth twice daily Rondel Jumbo, PA-C  Active   nitroGLYCERIN (NITROSTAT) 0.4 MG SL tablet [id number]  PLACE 1 TABLET UNDER THE TONGUE EVERY 5 MINUTES FOR 3 DOSES AS NEEDED FOR CHEST PAIN Leonie Man, MD  Active   [facility] ULTRA test strip [id number]  USE TO TEST DAILY Laurey Morale, MD  Active   POTASSIUM GLUCONATE PO [id number]  Take 99 mg by mouth daily. [provider]  Active Spouse/Significant Other  prednisoLONE acetate (PRED FORTE) 1 % ophthalmic suspension [id number] Yes Place 1 drop into the right eye daily. [provider]  Active   REPATHA [place] MG/3.5ML SOCT [id number]  [place] EVERY 30 DAYS Leonie Man, MD  Active   vitamin B-12 (CYANOCOBALAMIN) 1000 MCG tablet [id number]  Take 1,000 mcg by mouth daily. [provider]  Active             Patient Active Problem List  Diagnosis Date Noted   Myalgia due to statin 01/27/2021   Parkinson's disease ([place]) 11/07/2020   Alzheimer's dementia ([place]) 11/07/2020   Intermittent claudication ([place]) 12/01/2017   Trochanteric bursitis of right hip 09/04/2017   Fuchs' corneal dystrophy 08/14/2016   Essential hypertension -> allowing permissive hypertension.  Medication stopped because of fatigue and orthostatic dizziness. 03/05/2015   NSTEMI (non-ST elevated  myocardial infarction) [facility])    Coronary artery disease involving native coronary artery of native heart without angina pectoris    Vitamin D deficiency 07/04/2014   Fatigue 01/08/2014   Obesity (BMI 30-39.9) 01/12/2013   DM (diabetes mellitus), type 2 with complications - CAD    Dyslipidemia, goal LDL below 70    Exertional shortness of breath 01/12/2012   Chronotropic incompetence 12/25/2011   Vertigo with mild postural lightheadedness. 08/13/2009   ERECTILE DYSFUNCTION, ORGANIC 05/01/2009   GERD 04/18/2008   BPH with urinary obstruction 07/27/2007   CAD S/P percutaneous coronary angioplasty - LADx2, & RCA x 3 07/12/2001    Class: History of    Immunization History  Administered Date(s) Administered   Fluad Quad(high Dose 65+) 11/04/2018, 11/07/2019   Influenza Split 11/27/2010   Influenza, High Dose Seasonal PF 12/18/2013, 01/02/2015, 12/26/2016, 12/19/2017, 12/18/2020   Influenza-Unspecified 12/24/2012, 12/18/2013, 12/25/2014   PFIZER(Purple Top)SARS-COV-2 Vaccination 03/16/2019, 04/06/2019, 10/14/2019, 09/08/2020   Pfizer Covid-19 Vaccine Bivalent Booster 20yr & up 12/25/2020   Pneumococcal Conjugate-13 08/16/2015   Pneumococcal Polysaccharide-23 05/01/2009   Zoster Recombinat (Shingrix) 12/19/2017, 02/18/2018   Patient goes through periods of having trouble sleeping. He is up all hours of the full moon is out. Patient is only getting up to go to the bathroom about once a night. He usually has trouble falling asleep. Patient does watch TV right up until he falls asleep.   Patient has not tried anything for sleep thus far. Patient does take naps during the day and they can last 2-3 hours per night.  Conditions to be addressed/monitored:  Hypertension, Hyperlipidemia, Diabetes, Coronary Artery Disease, and Parkinson's and Alzheimer's dementia  Conditions addressed this visit: Hypertension, diabetes, hypertension  Care Plan : CCM Pharmacy Care Plan  Updates made by PViona Gilmore ROconto Fallssince 07/09/2021 12:00 AM     Problem: Problem: Hypertension, Hyperlipidemia, Diabetes, Coronary Artery Disease, and Parkinson's and Alzheimer's dementia      Long-Range Goal: Patient-Specific Goal   Start Date: 01/07/2021  Expected End Date: 01/07/2022  Recent Progress: On track  Priority: High  Note:   Current Barriers:  Unable to independently monitor therapeutic efficacy  Pharmacist Clinical Goal(s):  Patient will verbalize ability to afford treatment regimen achieve adherence to monitoring guidelines and medication adherence to achieve therapeutic efficacy through collaboration with PharmD and provider.   Interventions: 1:1 collaboration with FLaurey Morale MD regarding development and update of comprehensive plan of care as evidenced by provider attestation and co-signature Inter-disciplinary care team collaboration (see longitudinal plan of care) Comprehensive medication review performed; medication list updated in electronic medical record  Hypertension (BP goal <140/90) -Not ideally controlled -Current treatment: No medications -Medications previously tried: n/a  -Current home readings: 137/64 (not checking often) -Current dietary habits: tries to eat healthy -Current exercise habits: limited -Denies hypotensive/hypertensive symptoms -Educated on BP goals and benefits of medications for prevention of heart attack, stroke and kidney damage; Proper BP monitoring technique; Symptoms of hypotension and importance of maintaining adequate hydration; -Counseled to monitor BP at home weekly, document, and provide log at future appointments -Counseled on diet and exercise  extensively  Hyperlipidemia: (LDL goal < 70) -Controlled -Current treatment: Repatha inject 420 mg every 30 days - Appropriate, Effective, Safe, Accessible -Medications previously tried: Praluent (rash), statins (myalgias), fish oil (worried about memory)  -Current dietary patterns: tries  to pay attention to his diet -Current exercise habits: not much exercise -Educated on Cholesterol goals;  Importance of limiting foods high in cholesterol; Exercise goal of 150 minutes per week; -Counseled on diet and exercise extensively Recommended to continue current medication  CAD (Goal: prevent events) -Controlled -Current treatment  Aspirin 81 mg 1 tablet daily - had stopped Nitroglycerin 0.4 mg SL 1 tablet as needed -Medications previously tried: none  -Recommended to continue current medication   Diabetes (A1c goal <7%) -Controlled -Current medications: Glipizide 5 mg 1 tablet twice daily before meals - Appropriate, Query effective, Safe, Accessible -Medications previously tried: none  -Current home glucose readings fasting glucose: 150 (not checking often - 2-3 times a month) post prandial glucose: none -Denies hypoglycemic/hyperglycemic symptoms -Current meal patterns:  breakfast: sausage biscuit, eggs, honey dew and orange juice (trop 50) lunch: protein bar for lunch & candy  dinner: chili, soups; no fried foods - not as many carbs  snacks: m&ms drinks: orange juice, water; crystal lite lemonade; coffee in AM (decaf) - black -Current exercise: limited -Educated on A1c and blood sugar goals; Benefits of routine self-monitoring of blood sugar; Carbohydrate counting and/or plate method -Counseled to check feet daily and get yearly eye exams -Counseled on diet and exercise extensively Recommended to continue current medication Recommended cutting back on carbs with breakfast.  Parkinson's disease (Goal: minimize symptoms) -Controlled -Current treatment  Carbidopa-levodopa 25-100 mg 1 tablet twice dailyAppropriate, Effective, Safe, Accessible -Medications previously tried: none  -Recommended to continue current medication  Memory loss (Goal: prevent rapid worsening of memory) -Controlled -Current treatment  Memantine 10 mg 1 tablet twice daily - Appropriate,  Effective, Safe, Accessible -Medications previously tried: none  -Recommended to continue current medication   Health Maintenance -Vaccine gaps:COVID booster -Current therapy:  Vitamin B12 Magnesium chloride 1 tablet daily Potassium gluconate 595 mg 1 tablet daily -Educated on Cost vs benefit of each product must be carefully weighed by individual consumer -Patient is satisfied with current therapy and denies issues - Consider stopping cinnamon as they are unsure of the benefit.  [name] Patient will:  - take medications as prescribed as evidenced by patient report and record review check glucose a few times a week, document, and provide at future appointments target a minimum of 150 minutes of moderate intensity exercise weekly engage in dietary modifications by limiting carb intake with breakfast  Follow Up Plan: Telephone follow up appointment with care management team member scheduled for: 6 months      Medication Assistance: None required.  Patient affirms current coverage meets needs.  Compliance/Adherence/Medication fill history: Care Gaps: Foot exam, microalbumin, eye exam, COVID booster Last BP - 120/80 on 11/07/2020 Last A1C - 6.9 on 11/07/2020  Star-Rating Drugs: Glipizide 5 mg - last filled 04/22/2021 90 DS at [facility]  Patient's preferred pharmacy is:  [facility] [address], [place] - [place] [address] [place] [place] [address] Phone: [phone] Fax: [address] [place], [place] [address] [place] [address] Phone: [phone] Fax: [phone]   Uses pill box? Yes Pt endorses 99% compliance - Doesn't want upstream   We discussed: Benefits of medication synchronization, packaging and delivery as well as enhanced  pharmacist oversight with Upstream. Patient decided to: Continue current medication management  strategy  Care Plan and Follow Up Patient Decision:  Patient agrees to Care Plan and Follow-up.  Plan: Telephone follow up appointment with care management team member scheduled for:  6 months  Jeni Salles, PharmD, [place] [facility] [place] [phone]

## 2021-07-19 ENCOUNTER — Other Ambulatory Visit: Payer: Self-pay | Admitting: Family Medicine

## 2021-07-23 DIAGNOSIS — E785 Hyperlipidemia, unspecified: Secondary | ICD-10-CM

## 2021-07-23 DIAGNOSIS — I2510 Atherosclerotic heart disease of native coronary artery without angina pectoris: Secondary | ICD-10-CM | POA: Diagnosis not present

## 2021-07-23 DIAGNOSIS — Z7984 Long term (current) use of oral hypoglycemic drugs: Secondary | ICD-10-CM

## 2021-07-23 DIAGNOSIS — G20 Parkinson's disease: Secondary | ICD-10-CM

## 2021-07-23 DIAGNOSIS — I10 Essential (primary) hypertension: Secondary | ICD-10-CM

## 2021-07-23 DIAGNOSIS — E1169 Type 2 diabetes mellitus with other specified complication: Secondary | ICD-10-CM

## 2021-07-23 DIAGNOSIS — R69 Illness, unspecified: Secondary | ICD-10-CM | POA: Diagnosis not present

## 2021-07-23 DIAGNOSIS — F17210 Nicotine dependence, cigarettes, uncomplicated: Secondary | ICD-10-CM

## 2021-08-27 ENCOUNTER — Encounter: Payer: Self-pay | Admitting: Neurology

## 2021-08-27 ENCOUNTER — Ambulatory Visit: Payer: Medicare HMO | Admitting: Neurology

## 2021-08-27 ENCOUNTER — Telehealth: Payer: Self-pay | Admitting: Pharmacist

## 2021-08-27 VITALS — BP 154/77 | HR 57 | Wt 216.0 lb

## 2021-08-27 DIAGNOSIS — F02818 Dementia in other diseases classified elsewhere, unspecified severity, with other behavioral disturbance: Secondary | ICD-10-CM

## 2021-08-27 DIAGNOSIS — G301 Alzheimer's disease with late onset: Secondary | ICD-10-CM | POA: Diagnosis not present

## 2021-08-27 DIAGNOSIS — G20 Parkinson's disease: Secondary | ICD-10-CM

## 2021-08-27 DIAGNOSIS — R69 Illness, unspecified: Secondary | ICD-10-CM | POA: Diagnosis not present

## 2021-08-27 MED ORDER — MEMANTINE HCL 10 MG PO TABS: ORAL_TABLET | ORAL | 3 refills | Status: AC

## 2021-08-27 MED ORDER — CARBIDOPA-LEVODOPA 25-100 MG PO TABS: ORAL_TABLET | ORAL | 3 refills | Status: AC

## 2021-08-27 NOTE — Progress Notes (Signed)
NEUROLOGY FOLLOW UP OFFICE NOTE  Joe Murray [id number] [birth year]  HISTORY OF PRESENT ILLNESS: I had the pleasure of seeing Joe Murray in follow-up in the neurology clinic on 82/06/2021.  The patient was last seen 6 months ago by Memory Disorders PA Sharene Butters for dementia due to Alzheimer's disease with possible Lewy body spectrum component. He is again accompanied by his wife who helps supplement the history today.  Records and images were personally reviewed where available. Since his last visit, his wife has noticed more forgetfulness, asking the same questions repeatedly about today's appointment, he got dressed yesterday thinking it was the appointment date. He does not drive. His wife manages finances, medications, meals. She helps with dressing and bathing. He states his memory is not too good. He is on Memantine '10mg'$  BID without side effects. He is on Sinemet 25/'100mg'$  taken at breakfast and supper (5pm). Sometimes his wife forgets and gives it at dinner. They continue to report a lot of daytime drowsiness. He feels this started around 4 years ago. He wakes up once at night but notes difficulties with sleep maintenance even when younger. His wife denies any REM behaviors except for one time he was clapping his hands while asleep on his recliner. He talks in his sleep sometimes. We had previously discussed doing a sleep study but he again declines. His balance is not good, he has had a few stumbles and slid off the bed one time. He has had occasional left-sided headaches since a head injury in 2021, he takes prn Tylenol. No dizziness, but his wife reminds him he mentions dizziness when standing sometimes. No difficulty swallowing, focal numbness/tingling/weakness. His wife has noticed more resting tremors that occur once in a while. He has noticed his right leg would sometimes shake a little when trying to get on the lift chair. No personality changes, no paranoia or hallucinations.     History on Initial Assessment 04/06/2018: This is a 81 year old right-handed man with a history of hypertension, hyperlipidemia, CAD, diabetes, presenting for evaluation of memory loss. He reports his memory is lousy. He started noticing memory changes around a year ago. His wife feels memory changes started a few years ago which she attributes to statin use, however in the past year, forgetfulness has worsened. He watches a lot of Westerns but would watch the same show 1-2 weeks later, his wife reminds him he has seen it already but he denies it. He forgets conversations. He does not remember people from the past, someone would pass away and he would not remember then. No significant difficulties following directions/instructions. His wife has not noticed significant memory decline in the past year. He drives minimally due to vision issues, and denies getting lost. His wife manages finances. He was previously on several medications that were stopped 2 months ago due to fatigue and memory concerns. He is only taking glipizide. He had muscle pains on statin in the past, his wife feels the Repatha helped improve his cholesterol levels but also caused leg pain. He reports right leg pain mostly after walking a few steps, improving when he rests. His wife has noticed slowed movements, he would be very slow when opening things. He has a little trouble getting out of a chair and feels his balance is off when he first stands, but feels fine once he starts walking. His wife  has noticed over the past year that gait has slowed down and his stride has been shorter. He speaks  in a low voice and does not move his mouth much when talking, his wife reports he has always been this way. No major personality changes, no paranoia or hallucinations. No family history of dementia, no history of significant head injuries. He drinks alcohol once a week.  He has had headaches over the past year localized over the back of his left  ear. He describes a throbbing pain that only occurs in the evening around 30-40 minutes after he sits in his recliner. It would start wearing off after a few hours and resolves by bedtime. He takes Tylenol every evening. No associated nausea/vomiting, photo/phonophobia. He has trouble seeing the words on TV and reports monocular diplopia in his right eye. He denies any dizziness, dysarthria/dysphagia, neck/back pain, focal numbness/tingling, bowel/bladder dysfunction, anosmia, no falls. Sleep is good, he has some daytime drowsiness and snores minimally. No REM behavior disorder noted by wife. He has had occasional left hand tremors the past year, he denies any difficulty writing with his right hand or using utensils. He is a retired Systems developer.   Diagnostic Data:  TSH normal, B12 low normal 312.  MRI brain without contrast done 03/2018 did not show any acute changes. There was mild to moderate diffuse atrophy and chronic microvascular disease.   Neuropsychological evaluation in 01/2020 indicated memory storage type problems, executive dysfunction, diminished processing efficiency, likely due to Alzheimer's disease. His parkinsonism suggests there may be a Lewy Body spectrum component.    PAST MEDICAL HISTORY: Past Medical History:  Diagnosis Date   Alzheimer's dementia [facility])    sees Dr. Ellouise Newer   CAD S/P percutaneous coronary angioplasty 10/'03; 3/'09; 7/*09; 1/'17    a. Dr. Ellyn Hack; '03 - Cypher DES 3.0 mm 33 mm proximal-mid LAD (details D1 with ostial 60%); 3/'09 dRCA 2.75 mm x 13 mm Cypher DES (2.8 mm); 7/'09 pRCA 3.0 mm x 18 m Cypher DES (3.25 mm);; (2009) 2D Echo - EF 55%, (November 2014) nonischemic Myoview;; b. Promus DES to RCA and mid LAD 02/26/2015   Chronotropic incompetence 12/2011   ETT 11/2017: Normal blood pressure response.  No EKG changes.  Exercise stopped due to fatigue and dyspnea.  Heart rate increased to low 90s from 50s.  Suggest chronotropic incompetence.  Severely  impaired exercise capacity. -->  Results reviewed with electrophysiology: Still not indication for pacemaker.   Diabetes mellitus type 2 in obese [facility])    CAD   Diverticulosis    Dyslipidemia, goal LDL below 70    on [employer] study medicaion.   ED (erectile dysfunction)    GERD (gastroesophageal reflux disease)    Hemorrhoids 07/2002   Internal and External   Insomnia    Nodular basal cell carcinoma (BCC) 05/08/2019   left ear (cx24f)   [place]ST [place]   a. Proximal LAD tandem lesions -- long DES stent covering both;; b. Jan 2017: mRCA PCI, mLAD PCI   [facility]'s disease ([facility]    sees Dr. KEllouise Newer  Persistent sinus bradycardia    Personal history of colonic adenomas 09/06/2012   Psoriasis    Resting HR in 54s   SCC (squamous cell carcinoma) 11/13/2001   right upper outer arm (txpbx)   SCC (squamous cell carcinoma) 11/13/2009   right upper ear rim ([id number]   SCC (squamous cell carcinoma) 01/20/2010   right nose ([id number]  SCC (squamous cell carcinoma) 03/28/2014   right side of nose ( [id number] SCC (squamous cell carcinoma) 06/03/2016  right side of nose   SCC (squamous cell carcinoma) 05/08/2019   right ear inferior (cx48f) cis   SCCA (squamous cell carcinoma) of skin 04/23/2021   Right Temporal Scalp (well diff) (tx p bx)   Spasm of esophagus    Squamous cell carcinoma in situ (SCCIS) 05/08/2019   Right Ear Inferior   Squamous cell carcinoma of skin 11/08/2001   top right rim ear    [place]   Vertigo, benign positional     MEDICATIONS: Current Outpatient Medications on File Prior to Visit  Medication Sig Dispense Refill   aspirin EC 81 MG tablet Take 81 mg by mouth daily.     carbidopa-levodopa (SINEMET IR) 25-100 MG tablet Take 1 tablet by mouth in the morning and at bedtime. 60 tablet 11   glipiZIDE (GLUCOTROL) 5 MG tablet TAKE 1 TABLET BY MOUTH TWICE DAILY BEFORE A  MEAL 180 tablet 0   Lancets (BD LANCET ULTRAFINE 30G) MISC USE ONE  TO CHECK GLUCOSE ONCE DAILY 100 each 1   MAGNESIUM CHLORIDE PO Take 250 mg by mouth daily.     memantine (NAMENDA) 10 MG tablet Take 1 tablet by mouth twice daily 60 tablet 11   nitroGLYCERIN (NITROSTAT) 0.4 MG SL tablet PLACE 1 TABLET UNDER THE TONGUE EVERY 5 MINUTES FOR 3 DOSES AS NEEDED FOR CHEST PAIN 25 tablet 6   ONETOUCH ULTRA test strip USE TO TEST DAILY 50 each 0   POTASSIUM GLUCONATE PO Take 99 mg by mouth daily.     prednisoLONE acetate (PRED FORTE) 1 % ophthalmic suspension Place 1 drop into the right eye daily.     R[place]420 MG/3.5ML SOCT INJECT 420 MG INTO SKIN EVERY 30 DAYS 3.6 mL 11   vitamin B-12 (CYANOCOBALAMIN) 1000 MCG tablet Take 1,000 mcg by mouth daily.     No current facility-administered medications on file prior to visit.    ALLERGIES: Allergies  Allergen Reactions   Beta Adrenergic Blockers Other (See Comments)    chronotropic incompetence   Praluent [Alirocumab]     Diffuse Rash - improved when stopping & taking Benadryl   Sulfonamide Derivatives     unknown   Statins Other (See Comments)    REACTION: myalgias    FAMILY HISTORY: Family History  Problem Relation Age of Onset   Diabetes Mother    Hearing loss Father    Dementia Father    Heart attack Father 427      first MI prior to age 431  Heart attack Brother     SOCIAL HISTORY: Social History   Socioeconomic History   Marital status: Married    Spouse name: Not on file   Number of children: 4   Years of education: Not on file   Highest education level: Not on file  Occupational History   Occupation: retired  Tobacco Use   Smoking status: Every Day    Types: Cigars    Start date: 07/06/1952   Smokeless tobacco: Never   Tobacco comments:    1-2 per day; he says "I really don't inhale"  Vaping Use   Vaping Use: Never used  Substance and Sexual Activity   Alcohol use: Yes    Alcohol/week: 0.0  standard drinks of alcohol    Comment: occ   Drug use: No   Sexual activity: Never  Other Topics Concern   Not on file  Social History Narrative   He is a married, father of 3,  grandfather 3.   He still smokes 2-3 cigars per day. States that he "does not really inhale ". He is not really all that it's including, stating that he wants to have his 1 remaining vice.   Exercises only on occasion.   He does various landscaping jobs including cutting wood, and clearing brush.      Pt is right handed   Lives in 2 story home with his wife   High school graduate   Retired Dealer   Social Determinants of Health   Financial Resource Strain: [place]  (03/27/2021)   Overall Financial Resource Strain ([place])    Difficulty of Paying Living Expenses: Not hard at [place]: No [place] (03/27/2021)   Hunger Vital Sign    Worried About [place] in the Last Year: Never true    [place] in the Last Year: Never true  Transportation Needs: No Transportation Needs (03/27/2021)   PRAPARE - Hydrologist (Medical): No    Lack of Transportation (Non-Medical): No  Physical Activity: Insufficiently Active (03/27/2021)   Exercise Vital Sign    Days of Exercise per Week: 2 days    Minutes of Exercise per Session: 30 min  Stress: No Stress Concern Present (03/27/2021)   [place]    Feeling of Stress : Not at all  Social Connections: [place] (03/27/2021)   Social Connection and Isolation Panel [NHANES]    Frequency of Communication with Friends and Family: More than three times a week    Frequency of Social Gatherings with Friends and Family: More than three times a week    Attends Religious Services: 1 to 4 times per year    Active Member of [employer] or Organizations: Yes    Attends Archivist Meetings: 1 to 4 times per year    Marital Status: Married  Arboriculturist Violence: Not At Risk (03/27/2021)   Humiliation, Afraid, Rape, and Kick questionnaire    Fear of Current or Ex-Partner: No    Emotionally Abused: No    Physically Abused: No    Sexually Abused: No     PHYSICAL EXAM: Vitals:   08/27/21 0951  BP: (!) 154/77  Pulse: (!) 57  SpO2: 97%   General: No acute distress Head:  Normocephalic/atraumatic Skin/Extremities: No rash, no edema Neurological Exam: alert and oriented to person, place, and time. No aphasia or dysarthria. Fund of knowledge is appropriate.  Recent and remote memory are intact.  Attention and concentration are reduced. Difficulty with visuospatial tasks. MMSE 23/30    82/06/2021   10:00 AM 02/21/2020   11:00 AM 12/28/2019    9:00 AM  MMSE - Mini Mental State Exam  Orientation to time '4 3 4  '$ Orientation to Place '5 5 5  '$ Registration '3 3 3  '$ Attention/ Calculation '2 3 5  '$ Recall 2 1 0  Language- name 2 objects '2 2 2  '$ Language- repeat '1 1 1  '$ Language- follow 3 step command '3 3 3  '$ Language- read & follow direction '1 1 1  '$ Write a sentence 0 1 1  Copy design 0 1 1  Total score '23 24 26   '$ Cranial nerves: Pupils equal, round. Extraocular movements intact with no nystagmus. Visual fields full.  No facial asymmetry.  Motor: +cogwheeling, R>L today. muscle strength 5/5 throughout with no pronator drift.   Finger to nose testing intact.  Gait: hunched posture with reduced arm swing, arms flexed to his sides. He takes small steps with some gait festination noted. No tremor in the office today.    IMPRESSION: This is an 81 yo RH man with a history of hypertension, hyperlipidemia, diabetes, CAD, with mild dementia likely due to Alzheimer's disease with Lewy body component. MMSE today 23/30. Continue Memantine '10mg'$  BID. He is having more bradykinesia and gait instability, we discussed increasing Sinemet to 1 tab TID with meals. He will be referred for physical therapy, his wife felt in-home PT was not very helpful and would like  to go to PT facility. They continue to report daytime drowsiness, we again discussed doing a sleep study but he declined. He was encouraged to increase physical activity during the day such as attending day programs or a gym program. We again discussed no driving. Continue close supervision. Follow-up in 6 months, call for any changes.    Thank you for allowing me to participate in his care.  Please do not hesitate to call for any questions or concerns.   Ellouise Newer, M.D.   CC: Dr. Sarajane Jews

## 2021-08-27 NOTE — Patient Instructions (Addendum)
Good to see you.  Continue Memantine '10mg'$  twice a day  2. Increase the Sinemet (Carbidopa/Levodopa) 25/'100mg'$ : take 1 tablet three times a day with meals  3. Referral will be sent for physical therapy  4. Follow-up in 6 months, call for any changes   FALL PRECAUTIONS: Be cautious when walking. Scan the area for obstacles that may increase the risk of trips and falls. When getting up in the mornings, sit up at the edge of the bed for a few minutes before getting out of bed. Consider elevating the bed at the head end to avoid drop of blood pressure when getting up. Walk always in a well-lit room (use night lights in the walls). Avoid area rugs or power cords from appliances in the middle of the walkways. Use a walker or a cane if necessary and consider physical therapy for balance exercise. Get your eyesight checked regularly.  HOME SAFETY: Consider the safety of the kitchen when operating appliances like stoves, microwave oven, and blender. Consider having supervision and share cooking responsibilities until no longer able to participate in those. Accidents with firearms and other hazards in the house should be identified and addressed as well.  ABILITY TO BE LEFT ALONE: If patient is unable to contact 911 operator, consider using LifeLine, or when the need is there, arrange for someone to stay with patients. Smoking is a fire hazard, consider supervision or cessation. Risk of wandering should be assessed by caregiver and if detected at any point, supervision and safe proof recommendations should be instituted.    RECOMMENDATIONS FOR ALL PATIENTS WITH MEMORY PROBLEMS: 1. Continue to exercise (Recommend 30 minutes of walking everyday, or 3 hours every week) 2. Increase social interactions - continue going to [place] and enjoy social gatherings with friends and family 3. Eat healthy, avoid fried foods and eat more fruits and vegetables 4. Maintain adequate blood pressure, blood sugar, and blood  cholesterol level. Reducing the risk of stroke and cardiovascular disease also helps promoting better memory. 5. Avoid stressful situations. Live a simple life and avoid aggravations. Organize your time and prepare for the next day in anticipation. 6. Sleep well, avoid any interruptions of sleep and avoid any distractions in the bedroom that may interfere with adequate sleep quality 7. Avoid sugar, avoid sweets as there is a strong link between excessive sugar intake, diabetes, and cognitive impairment The Mediterranean diet has been shown to help patients reduce the risk of progressive memory disorders and reduces cardiovascular risk. This includes eating fish, eat fruits and green leafy vegetables, nuts like almonds and hazelnuts, walnuts, and also use olive oil. Avoid fast foods and fried foods as much as possible. Avoid sweets and sugar as sugar use has been linked to worsening of memory function.  There is always a concern of gradual progression of memory problems. If this is the case, then we may need to adjust level of care according to patient needs. Support, both to the patient and caregiver, should then be put into place. Dr.

## 2021-08-27 NOTE — Chronic Care Management (AMB) (Signed)
Chronic Care Management Pharmacy Assistant   Name: Joe Murray  MRN: [id number] DOB: 20-Mar-1940  Reason for Encounter: Disease State / Hypertension and Diabetes Assessment Call   Conditions to be addressed/monitored: HTN and DMII  Recent office visits:  None  Recent consult visits:  None  Hospital visits:  None  Medications: Outpatient Encounter Medications as of 08/27/2021  Medication Sig   aspirin EC 81 MG tablet Take 81 mg by mouth daily.   carbidopa-levodopa (SINEMET IR) 25-100 MG tablet Take 1 tablet three times a day with meals   glipiZIDE (GLUCOTROL) 5 MG tablet TAKE 1 TABLET BY MOUTH TWICE DAILY BEFORE A MEAL   Lancets (BD LANCET ULTRAFINE 30G) MISC USE ONE  TO CHECK GLUCOSE ONCE DAILY   MAGNESIUM CHLORIDE PO Take 250 mg by mouth daily.   memantine (NAMENDA) 10 MG tablet Take 1 tablet by mouth twice daily   nitroGLYCERIN (NITROSTAT) 0.4 MG SL tablet PLACE 1 TABLET UNDER THE TONGUE EVERY 5 MINUTES FOR 3 DOSES AS NEEDED FOR CHEST PAIN   ONETOUCH ULTRA test strip USE TO TEST DAILY   POTASSIUM GLUCONATE PO Take 99 mg by mouth daily.   prednisoLONE acetate (PRED FORTE) 1 % ophthalmic suspension Place 1 drop into the right eye daily.   [place] 420 MG/3.5ML SOCT INJECT 420 MG INTO SKIN EVERY 30 DAYS   vitamin B-12 (CYANOCOBALAMIN) 1000 MCG tablet Take 1,000 mcg by mouth daily.   No facility-administered encounter medications on file as of 08/27/2021.  Fill History: CARB/LEVO 25-'100MG'$  TAB 07/19/2021 30   REPATHA PUSH 420 MG/3.5ML INJ 06/17/2021 30   glipiZIDE (GLUCOTROL) tablet 07/22/2021 90   memantine (NAMENDA) tablet 07/20/2021 30   Recent Relevant Labs: Lab Results  Component Value Date/Time   HGBA1C 6.9 (H) 11/07/2020 10:15 AM   HGBA1C 6.5 (H) 11/07/2019 10:48 AM   MICROALBUR 1.8 08/09/2015 09:07 AM   MICROALBUR 0.7 06/27/2012 09:45 AM    Kidney Function Lab Results  Component Value Date/Time   CREATININE 1.46 11/07/2020 10:15 AM    CREATININE 1.26 (H) 11/07/2019 10:48 AM   CREATININE 1.23 06/01/2019 08:24 AM   CREATININE 1.18 03/06/2015 08:59 AM   GFR 45.06 (L) 11/07/2020 10:15 AM   GFRNONAA 55 (L) 06/01/2019 08:24 AM   GFRAA 64 06/01/2019 08:24 AM     Recent Office Vitals: BP Readings from Last 3 Encounters:  08/27/21 (!) 154/77  03/27/21 122/60  03/11/21 124/82   Pulse Readings from Last 3 Encounters:  08/27/21 (!) 57  03/11/21 72  01/06/21 (!) 50    Wt Readings from Last 3 Encounters:  08/27/21 216 lb (98 kg)  03/27/21 215 lb 8 oz (97.8 kg)  03/11/21 213 lb (96.6 kg)     Kidney Function Lab Results  Component Value Date/Time   CREATININE 1.46 11/07/2020 10:15 AM   CREATININE 1.26 (H) 11/07/2019 10:48 AM   CREATININE 1.23 06/01/2019 08:24 AM   CREATININE 1.18 03/06/2015 08:59 AM   GFR 45.06 (L) 11/07/2020 10:15 AM   GFRNONAA 55 (L) 06/01/2019 08:24 AM   GFRAA 64 06/01/2019 08:24 AM       Latest Ref Rng & Units 11/07/2020   10:15 AM 11/07/2019   10:48 AM 06/01/2019    8:24 AM  BMP  Glucose 70 - 99 mg/dL 148  129  134   BUN 6 - 23 mg/dL '21  24  22   '$ Creatinine 0.40 - 1.50 mg/dL 1.46  1.26  1.23   BUN/Creat Ratio 6 -  22 (calc)  19  18   Sodium 135 - 145 mEq/L 137  138  142   Potassium 3.5 - 5.1 mEq/L 4.7  4.5  4.5   Chloride 96 - 112 mEq/L 101  107  104   CO2 19 - 32 mEq/L '27  24  22   '$ Calcium 8.4 - 10.5 mg/dL 9.7  9.3  9.2   Current antihyperglycemic regimen:  Glipizide 5 mg twice daily  What recent interventions/DTPs have been made to improve glycemic control:  Patient is to be checking blood sugars a couple times per week.   Patient denies hypoglycemic symptoms, including None  Patient denies hyperglycemic symptoms, including none  How often are you checking your blood sugar? Patients wife states she is not checking his blood sugars regular, maybe checking every couple weeks, she only checks when she thinks about it. She was encouraged to check blood sugars at least 2-3 times per  week, she states she will try and do better.   What are your blood sugars ranging?  Fasting: 08/27/2021 his reading was 145, patients wife doesn't recall any other readings.   During the week, how often does your blood glucose drop below 70? Patients wife states she has never gotten a reading below 70.  Are you checking your feet daily/regularly? Patients wife is checking his feet regular.    Current antihypertensive regimen:  Carbidopa-Levodopa 25/100 mg three times daily  How often are you checking your Blood Pressure? Patients wife states she is checking his blood pressure about twice per month. She was encouraged to check blood pressures at least once weekly.   Current home BP readings: Patients wife does not recall his recent home readings. She states his blood pressure today at the doctors was 157/77.  What recent interventions/DTPs have been made by any provider to improve Blood Pressure control since last CPP Visit: Patient is to be checking blood pressures once weekly.   Any recent hospitalizations or ED visits since last visit with CPP? No recent hospital visits  What diet changes have been made to improve Blood Pressure Control?  Patient follows no specific diet, he eats what he wants. Breakfast - patient will have sausage and egg biscuit with melon Lunch - patient doesn't eat lunch Dinner - patient will have a meat with two vegetables  What exercise is being done to improve your Blood Pressure Control?  Patient gets a little walking around the house.  If approved with insurance patient will be starting physical therapy.  Adherence Review: Is the patient currently on a STATIN medication? No Is the patient currently on ACE/ARB medication? No Does the patient have >5 day gap between last estimated fill dates? No    Care Gaps: AWV - scheduled 03/30/2022 Last BP - 154/77 on 08/27/2021 Last A1C - 6.9 on 11/07/2020 Foot Exam - never done [place] - overdue Eye exam -  overdue HGA1C - overdue Tdap - postponed  Star Rating Drugs: Glipizide 5 mg - last filled 07/22/2021 90 DS at [place] Pharmacist Assistant [phone]

## 2021-09-03 ENCOUNTER — Telehealth: Payer: Self-pay | Admitting: Pharmacist

## 2021-09-03 NOTE — Chronic Care Management (AMB) (Signed)
Created in error

## 2021-09-04 ENCOUNTER — Ambulatory Visit: Payer: Medicare HMO | Attending: Neurology | Admitting: Physical Therapy

## 2021-09-04 DIAGNOSIS — F02818 Dementia in other diseases classified elsewhere, unspecified severity, with other behavioral disturbance: Secondary | ICD-10-CM | POA: Diagnosis not present

## 2021-09-04 DIAGNOSIS — R2681 Unsteadiness on feet: Secondary | ICD-10-CM | POA: Insufficient documentation

## 2021-09-04 DIAGNOSIS — G20 Parkinson's disease: Secondary | ICD-10-CM | POA: Diagnosis not present

## 2021-09-04 DIAGNOSIS — M6281 Muscle weakness (generalized): Secondary | ICD-10-CM | POA: Insufficient documentation

## 2021-09-04 DIAGNOSIS — R2689 Other abnormalities of gait and mobility: Secondary | ICD-10-CM | POA: Diagnosis not present

## 2021-09-04 DIAGNOSIS — G301 Alzheimer's disease with late onset: Secondary | ICD-10-CM | POA: Diagnosis not present

## 2021-09-04 DIAGNOSIS — R69 Illness, unspecified: Secondary | ICD-10-CM | POA: Diagnosis not present

## 2021-09-04 NOTE — Therapy (Signed)
OUTPATIENT PHYSICAL THERAPY NEURO EVALUATION   Patient Name: Joe Murray MRN: [id number] DOB:[birth year], 81 y.o., male Today's Date: 09/04/2021   PCP: Laurey Morale, MD REFERRING PROVIDER: Cameron Sprang, MD    PT End of Session - 09/04/21 0935     Visit Number 1    Number of Visits 17   plus eval   Date for PT Re-Evaluation 11/06/21    Authorization Type [employer]    Progress Note Due on Visit 10    PT Start Time 0930    PT Stop Time 1014    PT Time Calculation (min) 44 min    Activity Tolerance Patient tolerated treatment well    Behavior During Therapy [facility] for tasks assessed/performed             Past Medical History:  Diagnosis Date   Alzheimer's dementia [facility])    sees Dr. Ellouise Newer   CAD S/P percutaneous coronary angioplasty 10/'03; 3/'09; 7/*09; 1/'17    a. Dr. Ellyn Hack; '03 - Cypher DES 3.0 mm 33 mm proximal-mid LAD (details D1 with ostial 60%); 3/'09 dRCA 2.75 mm x 13 mm Cypher DES (2.8 mm); 7/'09 pRCA 3.0 mm x 18 m Cypher DES (3.25 mm);; (2009) 2D Echo - EF 55%, (November 2014) nonischemic Myoview;; b. Promus DES to RCA and mid LAD 02/26/2015   Chronotropic incompetence 12/2011   ETT 11/2017: Normal blood pressure response.  No EKG changes.  Exercise stopped due to fatigue and dyspnea.  Heart rate increased to low 90s from 50s.  Suggest chronotropic incompetence.  Severely impaired exercise capacity. -->  Results reviewed with electrophysiology: Still not indication for pacemaker.   Diabetes mellitus type 2 in obese [facility])    CAD   Diverticulosis    Dyslipidemia, goal LDL below 70    on [employer] study medicaion.   ED (erectile dysfunction)    GERD (gastroesophageal reflux disease)    Hemorrhoids 07/2002   Internal and External   Insomnia    Nodular basal cell carcinoma (BCC) 05/08/2019   left ear (cx51f)   Non-STEMI (non-ST elevated myocardial infarction) ([place] 11/2001   a. Proximal LAD tandem lesions -- long DES stent covering both;; b. Jan  2017: mRCA PCI, mLAD PCI   Parkinson's disease ([facility]    sees Dr. KEllouise Newer  Persistent sinus bradycardia    Personal history of colonic adenomas 09/06/2012   Psoriasis    Resting HR in 54s   SCC (squamous cell carcinoma) 11/13/2001   right upper outer arm (txpbx)   SCC (squamous cell carcinoma) 11/13/2009   right upper ear rim (cx3 530f   SCC (squamous cell carcinoma) 01/20/2010   right nose ([id number]  SCC (squamous cell carcinoma) 03/28/2014   right side of nose ( [id number] SCC (squamous cell carcinoma) 06/03/2016   right side of nose   SCC (squamous cell carcinoma) 05/08/2019   right ear inferior ([id number]   SCCA (squamous cell carcinoma) of skin 04/23/2021   Right Temporal Scalp (well diff) (tx p bx)   Spasm of esophagus    Squamous cell carcinoma in situ (SCCIS) 05/08/2019   Right Ear Inferior   Squamous cell carcinoma of skin 11/08/2001   top right rim ear    SVT (supraventricular tachycardia) (HCC) [place]   Vertigo, benign positional    Past Surgical History:  Procedure Laterality Date   CARDIAC CATHETERIZATION N/A 02/26/2015   Procedure: Left Heart Cath and Coronary Angiography;  Surgeon: DavidLeonie Man  mRCA 99% --> PCI. mLAD 75%-> FFR Guided PCI, Mod ISR in pLAD & pRCA   CARDIAC CATHETERIZATION N/A 02/26/2015   Procedure: Coronary Stent Intervention;  Surgeon: Leonie Man, MD;  Location: [facility] INVASIVE CV LAB: mRCA Promus Premier DES 3.0 x 12 (3.5), mLAD Promus Premier DES 2.75 x 20 (3.0)   CARDIAC CATHETERIZATION N/A 02/26/2015   Procedure: Intravascular Pressure Wire/FFR Study;  Surgeon: Leonie Man, MD;  Location: [place] CV LAB;  Service: Cardiovascular: LAD 75% - FFR + -> PCI   CATARACT EXTRACTION  [id number],[id number]   COLONOSCOPY  12/16/2017   per Dr. Carlean Purl, adenomatous polyps, no repeats due to age    [place]  LAD - 2003, RCA 3 & 7/ '09   Cypher 3.0 x 32 mid LAD; Cyper 2.75 x 13 - distal RCA, 3.0 x 18 Prox RCA    ESOPHAGOGASTRODUODENOSCOPY  08-11-02   esophageal dilation per Dr. Carlean Purl   HERNIA REPAIR     right inguinal    NM MYOVIEW LTD  Nov 2014   ~8 METS; EF 60%, no ischemia or infarction   PERCUTANEOUS CORONARY STENT INTERVENTION (PCI-S)  11/23/2001   NSTEMI: Prox-Mid LAD tandem ~80% lesions on either side of D1 (with 80% lesion) -- Cypher DES 3.0 mm x 33 m (covering both lesions)    PERCUTANEOUS CORONARY STENT INTERVENTION (PCI-S)  04/27/2007   Unstable Angina: Distal RCA 95%: Cypher 2.75x13  (2.8 mm); residual focal ~60%ISR in LAD stent, 60% D1 ostial (no PTCA on LAD due to no ischemia on ST)   PERCUTANEOUS CORONARY STENT INTERVENTION (PCI-S)  09/21/2007   Bradycardia & Unstable Angina: Prox RCA 70% - PCI Cypher DES 3.0 mm x 18 mm  (3.25 mm); ostial 60-70% jailed D1. LAD & distal RCA stents patent   SHOULDER SURGERY     left rotator cuff, Dr. Gladstone Lighter   TRANSTHORACIC ECHOCARDIOGRAM  March 2009   Normal LV size and function, EF 55%. Mild MR; mild RV dilation.   TRANSTHORACIC ECHOCARDIOGRAM  02/2015   EF 60-65%. Moderate concentric LVH. Normal function with normal regional wall motion. GR 1 DD   WRIST FRACTURE SURGERY     right   Patient Active Problem List   Diagnosis Date Noted   Myalgia due to statin 01/27/2021   Parkinson's disease ([place]) 11/07/2020   Alzheimer's dementia ([place]) 11/07/2020   Intermittent claudication ([place]) 12/01/2017   Trochanteric bursitis of right hip 09/04/2017   Fuchs' corneal dystrophy 08/14/2016   Essential hypertension -> allowing permissive hypertension.  Medication stopped because of fatigue and orthostatic dizziness. 03/05/2015   NSTEMI (non-ST elevated myocardial infarction) [facility])    Coronary artery disease involving native coronary artery of native heart without angina pectoris    Vitamin D deficiency 07/04/2014   Fatigue 01/08/2014   Obesity (BMI 30-39.9) 01/12/2013   DM (diabetes mellitus), type 2 with complications - CAD    Dyslipidemia, goal LDL below 70     Exertional shortness of breath 01/12/2012   Chronotropic incompetence 12/25/2011   Vertigo with mild postural lightheadedness. 08/13/2009   ERECTILE DYSFUNCTION, ORGANIC 05/01/2009   GERD 04/18/2008   BPH with urinary obstruction 07/27/2007   CAD S/P percutaneous coronary angioplasty - LADx2, & RCA x 3 07/12/2001    Class: History of    ONSET DATE: 08/27/2021 (referral)  REFERRING DIAG: G30.1,F02.818 (ICD-10-CM) - Alzheimer's dementia, late onset, with behavioral disturbance ([place]) G20 (ICD-10-CM) - Parkinsonism, unspecified Parkinsonism type ([place])   THERAPY DIAG:  Unsteadiness on feet  Muscle weakness (generalized)  Other abnormalities of gait and mobility  Rationale for Evaluation and Treatment Rehabilitation  SUBJECTIVE:                                                                                                                                                                                              SUBJECTIVE STATEMENT: "My system is just down. I can only walk for so long before needing to sit down". Pt was receiving HHPT for about 8 weeks but pt's wife states it was not helping much. Pt reports he does not do much during day, wife states he is profoundly sedentary.   Pt accompanied by: significant other, Dorris   PERTINENT HISTORY: hypertension, hyperlipidemia, CAD, diabetes. Neuropsychological evaluation in 01/2020 indicated memory storage type problems, executive dysfunction, diminished processing efficiency, likely due to Alzheimer's disease. His parkinsonism suggests there may be a Lewy Body spectrum component.   PAIN:  Are you having pain? Yes: NPRS scale: 5/10 Pain location: low back Pain description: achy   PRECAUTIONS: Fall  WEIGHT BEARING RESTRICTIONS No  FALLS: Has patient fallen in last 6 months? No, but reports several near misses   LIVING ENVIRONMENT: Lives with: lives with their spouse Lives in: House/apartment Stairs: Yes: Internal: full  flight steps; can reach both Has following equipment at home: Single point cane, Quad cane large base, Walker - 2 wheeled, and transport chair  PLOF: Independent with basic ADLs, Independent with household mobility with device, and Needs assistance with homemaking  PATIENT GOALS "Getting my muscles working"   OBJECTIVE:   COGNITION: Overall cognitive status: Impaired   SENSATION: Not tested  COORDINATION: Heel to shin test: WFL bilaterally  Finger to nose: dysmetria bilaterally (R>L), required max concurrent verbal cues to perform  POSTURE: rounded shoulders, forward head, increased thoracic kyphosis, and posterior pelvic tilt  LOWER EXTREMITY MMT:    MMT Right Eval Left Eval  Hip flexion 4+ 4+  Hip extension    Hip abduction 4+ 4  Hip adduction 4 4  Hip internal rotation    Hip external rotation    Knee flexion 4+ 4+  Knee extension 4 4+  Ankle dorsiflexion 4+ 4+  Ankle plantarflexion    Ankle inversion    Ankle eversion    (Blank rows = not tested)  BED MOBILITY:  Pt reports he is slow but is able to perform on his own. Wife reports difficulty w/managing covers   TRANSFERS: Assistive device utilized: None  Sit to stand: SBA pt requires extra time to initiate movement and very bradykinetic. Good anterior weight shift but heavy reliance on BUEs  Stand to  sit: SBA  GAIT: Gait pattern: step through pattern, decreased arm swing- Left, decreased step length- Right, decreased step length- Left, decreased stride length, decreased hip/knee flexion- Right, decreased hip/knee flexion- Left, decreased ankle dorsiflexion- Right, decreased ankle dorsiflexion- Left, shuffling, decreased trunk rotation, narrow BOS, poor foot clearance- Right, and poor foot clearance- Left Distance walked: Various clinic distances  Assistive device utilized: Single point cane Level of assistance: SBA Comments: Pt very bradykinetic and rigid in sagittal plane. Very unstable w/use of SPC and poor  sequencing noted throughout   FUNCTIONAL TESTs:   [facility] PT Assessment - 09/04/21 1000       Transfers   Five time sit to stand comments  55.06s w/BUE support      Ambulation/Gait   Gait velocity 32.8' over 27.53s = 1.19 ft/s w/SPC   Recurrent fall risk, household ambulator            TODAY'S TREATMENT:  Next session    PATIENT EDUCATION: Education details: Eval findings, POC Person educated: Patient and Spouse Education method: Explanation, Demonstration, and Verbal cues Education comprehension: verbalized understanding and needs further education   HOME EXERCISE PROGRAM: To be established next session     GOALS: Goals reviewed with patient? Yes  SHORT TERM GOALS: Target date: 10/02/2021  Pt will perform initial HEP w/min A from wife for improved strength, balance, transfers and gait.  Baseline: not established on eval Goal status: INITIAL  2.  Pt will improve gait velocity to at least 1.4 ft/s w/LRAD and S* for improved gait efficiency and safety  Baseline: 1.19 ft/s w/SPC and CGA Goal status: INITIAL  3.  TUG to be assessed and STG/LTG written  Baseline:  Goal status: INITIAL  4.  Pt will improve 5 x STS to less than or equal to 48 seconds w/BUE support to demonstrate improved functional strength and transfer efficiency.   Baseline: 55.06s w/BUE support  Goal status: INITIAL  5.  Pt will ambulate 150' or greater w/LRAD and S* for improved safety with functional mobility  Baseline: ambulating w/SPC and CGA Goal status: INITIAL   LONG TERM GOALS: Target date: 10/30/2021  Pt will perform final HEP w/min A from wife for improved strength, balance, transfers and gait. Baseline:  Goal status: INITIAL  2.  TUG goal  Baseline:  Goal status: INITIAL  3.  Pt will improve 5 x STS to less than or equal to 41 seconds w/BUE support to demonstrate improved functional strength and transfer efficiency.   Baseline: 55.06s w/BUE support  Goal status:  INITIAL  4.  Pt and wife will verbalize and demonstrate fall prevention strategies in the home for reduced fall risk and improved safety  Baseline:  Goal status: INITIAL  5.  Pt will improve gait velocity to at least 1.9 ft/s w/LRAD and S* for improved gait efficiency and reduced fall risk  Baseline: 1.19 ft/s w/SPC and CGA Goal status: INITIAL  6.  Pt will ambulate 300' or greater w/LRAD and S* for improved global endurance and functional mobility independence  Baseline: ambulating w/SPC and CGA Goal status: INITIAL  ASSESSMENT:  CLINICAL IMPRESSION: Patient is a 81 year old male referred to Neuro OPPT for PD w/possible Lewy Body component. Pt's PMH is significant for: hypertension, hyperlipidemia, CAD, diabetes, Parkinsonism. The following deficits were present during the exam: decreased safety awareness, impaired focused attention, decreased strength, decreased balance and impaired gait kinematics. Based on 5x STS, gait speed and fall history, pt is an incr risk for falls. Pt would  benefit from skilled PT to address these impairments and functional limitations to maximize functional mobility independence.    OBJECTIVE IMPAIRMENTS Abnormal gait, decreased activity tolerance, decreased balance, decreased cognition, decreased coordination, decreased endurance, decreased knowledge of condition, decreased knowledge of use of DME, decreased mobility, difficulty walking, decreased strength, decreased safety awareness, and pain.   ACTIVITY LIMITATIONS carrying, lifting, bending, standing, squatting, sleeping, stairs, transfers, bed mobility, reach over head, and locomotion level  PARTICIPATION LIMITATIONS: meal prep, cleaning, laundry, medication management, personal finances, interpersonal relationship, driving, shopping, community activity, and yard work  PERSONAL FACTORS Age, Behavior pattern, Fitness, Past/current experiences, Time since onset of injury/illness/exacerbation, Transportation,  and 1 comorbidity: Dementia  are also affecting patient's functional outcome.   REHAB POTENTIAL: Fair due to poor prognosis associated w/Lewy Body Dementia   CLINICAL DECISION MAKING: Evolving/moderate complexity  EVALUATION COMPLEXITY: Moderate  PLAN: PT FREQUENCY: 2x/week  PT DURATION: 8 weeks  PLANNED INTERVENTIONS: Therapeutic exercises, Therapeutic activity, Neuromuscular re-education, Balance training, Gait training, Patient/Family education, Self Care, Joint mobilization, Stair training, DME instructions, Manual therapy, and Re-evaluation  PLAN FOR NEXT SESSION: TUG, trial gait w/RW vs rollator, establish simple initial HEP for global strength and increased step length    Lavana Huckeba E Delonte Musich, PT, DPT 09/04/2021, 3:24 PM

## 2021-09-10 ENCOUNTER — Ambulatory Visit: Payer: Medicare HMO

## 2021-09-10 DIAGNOSIS — R2689 Other abnormalities of gait and mobility: Secondary | ICD-10-CM | POA: Diagnosis not present

## 2021-09-10 DIAGNOSIS — M6281 Muscle weakness (generalized): Secondary | ICD-10-CM

## 2021-09-10 DIAGNOSIS — R2681 Unsteadiness on feet: Secondary | ICD-10-CM | POA: Diagnosis not present

## 2021-09-10 DIAGNOSIS — R69 Illness, unspecified: Secondary | ICD-10-CM | POA: Diagnosis not present

## 2021-09-10 DIAGNOSIS — G301 Alzheimer's disease with late onset: Secondary | ICD-10-CM | POA: Diagnosis not present

## 2021-09-10 DIAGNOSIS — G20 Parkinson's disease: Secondary | ICD-10-CM | POA: Diagnosis not present

## 2021-09-10 NOTE — Therapy (Signed)
OUTPATIENT PHYSICAL THERAPY TREATMENT NOTE   Patient Name: Joe Murray MRN: [id number] DOB:1940/09/16, 81 y.o., male Today's Date: 09/10/2021  PCP: Alysia Penna, MD REFERRING PROVIDER: Alysia Penna, MD  END OF SESSION:   PT End of Session - 09/10/21 0843     Visit Number 2    Number of Visits 17    Date for PT Re-Evaluation 11/06/21    Authorization Type [employer]    Progress Note Due on Visit 10    PT Start Time 0845    PT Stop Time 0926    PT Time Calculation (min) 41 min    Activity Tolerance Patient tolerated treatment well    Behavior During Therapy [facility] for tasks assessed/performed             Past Medical History:  Diagnosis Date   Alzheimer's dementia [facility])    sees Dr. Ellouise Newer   CAD S/P percutaneous coronary angioplasty 10/'03; 3/'09; 7/*09; 1/'17    a. Dr. Ellyn Hack; '03 - Cypher DES 3.0 mm 33 mm proximal-mid LAD (details D1 with ostial 60%); 3/'09 dRCA 2.75 mm x 13 mm Cypher DES (2.8 mm); 7/'09 pRCA 3.0 mm x 18 m Cypher DES (3.25 mm);; (2009) 2D Echo - EF 55%, (November 2014) nonischemic Myoview;; b. Promus DES to RCA and mid LAD 02/26/2015   Chronotropic incompetence 12/2011   ETT 11/2017: Normal blood pressure response.  No EKG changes.  Exercise stopped due to fatigue and dyspnea.  Heart rate increased to low 90s from 50s.  Suggest chronotropic incompetence.  Severely impaired exercise capacity. -->  Results reviewed with electrophysiology: Still not indication for pacemaker.   Diabetes mellitus type 2 in obese [facility])    CAD   Diverticulosis    Dyslipidemia, goal LDL below 70    on [employer] study medicaion.   ED (erectile dysfunction)    GERD (gastroesophageal reflux disease)    Hemorrhoids 07/2002   Internal and External   Insomnia    Nodular basal cell carcinoma (BCC) 05/08/2019   left ear (cx80f)   Non-STEMI (non-ST elevated myocardial infarction) [place] 11/2001   a. Proximal LAD tandem lesions -- long DES stent covering both;; b. Jan 2017:  mRCA PCI, mLAD PCI   Parkinson's disease ([facility]    sees Dr. KEllouise Newer  Persistent sinus bradycardia    Personal history of colonic adenomas 09/06/2012   Psoriasis    Resting HR in 54s   SCC (squamous cell carcinoma) 11/13/2001   right upper outer arm (txpbx)   SCC (squamous cell carcinoma) 11/13/2009   right upper ear rim (cx3 581f   SCC (squamous cell carcinoma) 01/20/2010   right nose ([id number]  SCC (squamous cell carcinoma) 03/28/2014   right side of nose ( cx35f32f SCC (squamous cell carcinoma) 06/03/2016   right side of nose   SCC (squamous cell carcinoma) 05/08/2019   right ear inferior ([id number]   SCCA (squamous cell carcinoma) of skin 04/23/2021   Right Temporal Scalp (well diff) (tx p bx)   Spasm of esophagus    Squamous cell carcinoma in situ (SCCIS) 05/08/2019   Right Ear Inferior   Squamous cell carcinoma of skin 11/08/2001   top right rim ear    SVT (supraventricular tachycardia) (HCC) [place]   Vertigo, benign positional    Past Surgical History:  Procedure Laterality Date   CARDIAC CATHETERIZATION N/A 02/26/2015   Procedure: Left Heart Cath and Coronary Angiography;  Surgeon: DavidLeonie Man mRCA 99% -->  PCI. mLAD 75%-> FFR Guided PCI, Mod ISR in pLAD & pRCA   CARDIAC CATHETERIZATION N/A 02/26/2015   Procedure: Coronary Stent Intervention;  Surgeon: Leonie Man, MD;  Location: [facility] INVASIVE CV LAB: mRCA Promus Premier DES 3.0 x 12 (3.5), mLAD Promus Premier DES 2.75 x 20 (3.0)   CARDIAC CATHETERIZATION N/A 02/26/2015   Procedure: Intravascular Pressure Wire/FFR Study;  Surgeon: Leonie Man, MD;  Location: [facility];  Service: Cardiovascular: LAD 75% - FFR + -> PCI   CATARACT EXTRACTION  [id number]   COLONOSCOPY  12/16/2017   per Dr. Carlean Purl, adenomatous polyps, no repeats due to age    [place]  LAD - 2003, RCA 3 & 7/ '09   Cypher 3.0 x 32 mid LAD; Cyper 2.75 x 13 - distal RCA, 3.0 x 18 Prox RCA    ESOPHAGOGASTRODUODENOSCOPY  08-11-02   esophageal dilation per Dr. Carlean Purl   HERNIA REPAIR     right inguinal    NM MYOVIEW LTD  Nov 2014   ~8 METS; EF 60%, no ischemia or infarction   PERCUTANEOUS CORONARY STENT INTERVENTION (PCI-S)  11/23/2001   NSTEMI: Prox-Mid LAD tandem ~80% lesions on either side of D1 (with 80% lesion) -- Cypher DES 3.0 mm x 33 m (covering both lesions)    PERCUTANEOUS CORONARY STENT INTERVENTION (PCI-S)  04/27/2007   Unstable Angina: Distal RCA 95%: Cypher 2.75x13  (2.8 mm); residual focal ~60%ISR in LAD stent, 60% D1 ostial (no PTCA on LAD due to no ischemia on ST)   PERCUTANEOUS CORONARY STENT INTERVENTION (PCI-S)  09/21/2007   Bradycardia & Unstable Angina: Prox RCA 70% - PCI Cypher DES 3.0 mm x 18 mm  (3.25 mm); ostial 60-70% jailed D1. LAD & distal RCA stents patent   SHOULDER SURGERY     left rotator cuff, Dr. Gladstone Lighter   TRANSTHORACIC ECHOCARDIOGRAM  March 2009   Normal LV size and function, EF 55%. Mild MR; mild RV dilation.   TRANSTHORACIC ECHOCARDIOGRAM  02/2015   EF 60-65%. Moderate concentric LVH. Normal function with normal regional wall motion. GR 1 DD   WRIST FRACTURE SURGERY     right   Patient Active Problem List   Diagnosis Date Noted   Myalgia due to statin 01/27/2021   Parkinson's disease ([place]) 11/07/2020   Alzheimer's dementia ([place]) 11/07/2020   Intermittent claudication ([place]) 12/01/2017   Trochanteric bursitis of right hip 09/04/2017   Fuchs' corneal dystrophy 08/14/2016   Essential hypertension -> allowing permissive hypertension.  Medication stopped because of fatigue and orthostatic dizziness. 03/05/2015   NSTEMI (non-ST elevated myocardial infarction) [facility])    Coronary artery disease involving native coronary artery of native heart without angina pectoris    Vitamin D deficiency 07/04/2014   Fatigue 01/08/2014   Obesity (BMI 30-39.9) 01/12/2013   DM (diabetes mellitus), type 2 with complications - CAD    Dyslipidemia, goal LDL below 70     Exertional shortness of breath 01/12/2012   Chronotropic incompetence 12/25/2011   Vertigo with mild postural lightheadedness. 08/13/2009   ERECTILE DYSFUNCTION, ORGANIC 05/01/2009   GERD 04/18/2008   BPH with urinary obstruction 07/27/2007   CAD S/P percutaneous coronary angioplasty - LADx2, & RCA x 3 07/12/2001    Class: History of    REFERRING DIAG: G30.1,F02.818 (ICD-10-CM) - Alzheimer's dementia, late onset, with behavioral disturbance ([place]) G20 (ICD-10-CM) - Parkinsonism, unspecified Parkinsonism type ([place])   THERAPY DIAG:  Unsteadiness on feet  Muscle weakness (generalized)  Other abnormalities of  gait and mobility  Rationale for Evaluation and Treatment Rehabilitation  PERTINENT HISTORY: hypertension, hyperlipidemia, CAD, diabetes. Neuropsychological evaluation in 01/2020 indicated memory storage type problems, executive dysfunction, diminished processing efficiency, likely due to Alzheimer's disease. His parkinsonism suggests there may be a Lewy Body spectrum component.   PRECAUTIONS: fall  SUBJECTIVE: Patient reports doing well- no falls, but "a lot" of near-falls.   PAIN:  Are you having pain? No  TODAY'S TREATMENT:  Therex:  -HEP (see below)  Theract:  [facility] PT Assessment - 09/10/21 0001       Standardized Balance Assessment   Standardized Balance Assessment Timed Up and Go Test      Timed Up and Go Test   Normal TUG (seconds) 45.56    Manual TUG (seconds) 53.69   cup of water   Cognitive TUG (seconds) 48.69   unable to do any calculations           NMR:  -Fwd stepping to targets (CGA)  -lateral stepping to targets (CGA/MinA) (patient reporting that he felt as though he was going to fall backward, but weight was shifting forward)  -carryover to overground gait x115'    PATIENT EDUCATION: Education details: HEP Person educated: Patient and Spouse Education method: Explanation, Demonstration, and Verbal cues Education comprehension: verbalized  understanding and needs further education     HOME EXERCISE PROGRAM: Access Code: [email or web address] URL: [email or web address] Date: 09/10/2021 Prepared by: Estevan Ryder  Exercises - Sit to Stand with Counter Support  - 1 x daily - 7 x weekly - 3 sets - 10 reps - Seated Heel Raise  - 1 x daily - 7 x weekly - 3 sets - 10 reps - Seated Toe Raise  - 1 x daily - 7 x weekly - 3 sets - 10 reps - Seated Scapular Retraction  - 1 x daily - 7 x weekly - 3 sets - 10 reps - Seated March  - 1 x daily - 7 x weekly - 3 sets - 10 reps - Seated Long Arc Quad  - 1 x daily - 7 x weekly - 3 sets - 10 reps     GOALS: Goals reviewed with patient? Yes   SHORT TERM GOALS: Target date: 10/02/2021   Pt will perform initial HEP w/min A from wife for improved strength, balance, transfers and gait.   Baseline: not established on eval Goal status: INITIAL   2.  Pt will improve gait velocity to at least 1.4 ft/s w/LRAD and S* for improved gait efficiency and safety   Baseline: 1.19 ft/s w/SPC and CGA Goal status: INITIAL   3.  TUG to be assessed and STG/LTG written  Baseline: 45.56s with CGA Goal status: MET   4.  Pt will improve 5 x STS to less than or equal to 48 seconds w/BUE support to demonstrate improved functional strength and transfer efficiency.    Baseline: 55.06s w/BUE support  Goal status: INITIAL   5.  Pt will ambulate 150' or greater w/LRAD and S* for improved safety with functional mobility  Baseline: ambulating w/SPC and CGA Goal status: INITIAL  6. Pt will improve TUG to </= 37s secs to demonstrated reduced fall risk  Baseline: 45.56s   Goal Status: INITIAL       LONG TERM GOALS: Target date: 10/30/2021   Pt will perform final HEP w/min A from wife for improved strength, balance, transfers and gait. Baseline:  Goal status: INITIAL   2.  Pt will improve TUG to </= 28 secs  to demonstrated reduced fall risk  Baseline: 45.56s Goal status: INITIAL   3.  Pt will improve  5 x STS to less than or equal to 41 seconds w/BUE support to demonstrate improved functional strength and transfer efficiency.    Baseline: 55.06s w/BUE support  Goal status: INITIAL   4.  Pt and wife will verbalize and demonstrate fall prevention strategies in the home for reduced fall risk and improved safety  Baseline:  Goal status: INITIAL   5.  Pt will improve gait velocity to at least 1.9 ft/s w/LRAD and S* for improved gait efficiency and reduced fall risk   Baseline: 1.19 ft/s w/SPC and CGA Goal status: INITIAL   6.  Pt will ambulate 300' or greater w/LRAD and S* for improved global endurance and functional mobility independence  Baseline: ambulating w/SPC and CGA Goal status: INITIAL   ASSESSMENT:   CLINICAL IMPRESSION: Patient seen for skilled PT session with emphasis on HEP development, functional strength and step length. He was able to complete HEP with verbal cues and demonstration. HEP to be completed with assist from wife- they verbalized understanding. Patient completed the Timed Up and Go test (TUG) in 45.56 seconds.  Geriatrics: need for further assessment of fall risk: ? 12 sec; Recurrent falls: > 15 sec; Vestibular Disorders fall risk: > 15 sec; Parkinson's Disease fall risk: > 16 sec ([email or web address], [email or web address]). Patient very responsive to visual cues for longer step length with noted good carryover to overground gait. Continue POC.     OBJECTIVE IMPAIRMENTS Abnormal gait, decreased activity tolerance, decreased balance, decreased cognition, decreased coordination, decreased endurance, decreased knowledge of condition, decreased knowledge of use of DME, decreased mobility, difficulty walking, decreased strength, decreased safety awareness, and pain.    ACTIVITY LIMITATIONS carrying, lifting, bending, standing, squatting, sleeping, stairs, transfers, bed mobility, reach over head, and locomotion level   PARTICIPATION LIMITATIONS: meal prep, cleaning, laundry, medication  management, personal finances, interpersonal relationship, driving, shopping, community activity, and yard work   PERSONAL FACTORS Age, Behavior pattern, Fitness, Past/current experiences, Time since onset of injury/illness/exacerbation, Transportation, and 1 comorbidity: Dementia  are also affecting patient's functional outcome.    REHAB POTENTIAL: Fair due to poor prognosis associated w/Lewy Body Dementia    CLINICAL DECISION MAKING: Evolving/moderate complexity   EVALUATION COMPLEXITY: Moderate   PLAN: PT FREQUENCY: 2x/week   PT DURATION: 8 weeks   PLANNED INTERVENTIONS: Therapeutic exercises, Therapeutic activity, Neuromuscular re-education, Balance training, Gait training, Patient/Family education, Self Care, Joint mobilization, Stair training, DME instructions, Manual therapy, and Re-evaluation   PLAN FOR NEXT SESSION: trial gait w/RW vs rollator, step length, scifit for large amplitude coordination  Debbora Dus, PT Debbora Dus, PT, DPT, CBIS  09/10/2021, 9:28 AM

## 2021-09-12 ENCOUNTER — Ambulatory Visit: Payer: Medicare HMO | Admitting: Physical Therapy

## 2021-09-12 DIAGNOSIS — M6281 Muscle weakness (generalized): Secondary | ICD-10-CM | POA: Diagnosis not present

## 2021-09-12 DIAGNOSIS — G20 Parkinson's disease: Secondary | ICD-10-CM | POA: Diagnosis not present

## 2021-09-12 DIAGNOSIS — R2689 Other abnormalities of gait and mobility: Secondary | ICD-10-CM

## 2021-09-12 DIAGNOSIS — R69 Illness, unspecified: Secondary | ICD-10-CM | POA: Diagnosis not present

## 2021-09-12 DIAGNOSIS — G301 Alzheimer's disease with late onset: Secondary | ICD-10-CM | POA: Diagnosis not present

## 2021-09-12 DIAGNOSIS — R2681 Unsteadiness on feet: Secondary | ICD-10-CM | POA: Diagnosis not present

## 2021-09-12 NOTE — Therapy (Signed)
OUTPATIENT PHYSICAL THERAPY TREATMENT NOTE   Patient Name: Joe Murray MRN: [id number] DOB:22-Oct-1940, 81 y.o., male Today's Date: 09/12/2021  PCP: Alysia Penna, MD REFERRING PROVIDER: Alysia Penna, MD  END OF SESSION:   PT End of Session - 09/12/21 0847     Visit Number 3    Number of Visits 17    Date for PT Re-Evaluation 11/06/21    Authorization Type [employer]    Progress Note Due on Visit 10    PT Start Time 0845    PT Stop Time 0928    PT Time Calculation (min) 43 min    Equipment Utilized During Treatment Gait belt    Activity Tolerance Patient tolerated treatment well    Behavior During Therapy [facility] for tasks assessed/performed              Past Medical History:  Diagnosis Date   Alzheimer's dementia [facility])    sees Dr. Ellouise Newer   CAD S/P percutaneous coronary angioplasty 10/'03; 3/'09; 7/*09; 1/'17    a. Dr. Ellyn Hack; '03 - Cypher DES 3.0 mm 33 mm proximal-mid LAD (details D1 with ostial 60%); 3/'09 dRCA 2.75 mm x 13 mm Cypher DES (2.8 mm); 7/'09 pRCA 3.0 mm x 18 m Cypher DES (3.25 mm);; (2009) 2D Echo - EF 55%, (November 2014) nonischemic Myoview;; b. Promus DES to RCA and mid LAD 02/26/2015   Chronotropic incompetence 12/2011   ETT 11/2017: Normal blood pressure response.  No EKG changes.  Exercise stopped due to fatigue and dyspnea.  Heart rate increased to low 90s from 50s.  Suggest chronotropic incompetence.  Severely impaired exercise capacity. -->  Results reviewed with electrophysiology: Still not indication for pacemaker.   Diabetes mellitus type 2 in obese [facility])    CAD   Diverticulosis    Dyslipidemia, goal LDL below 70    on [employer] study medicaion.   ED (erectile dysfunction)    GERD (gastroesophageal reflux disease)    Hemorrhoids 07/2002   Internal and External   Insomnia    Nodular basal cell carcinoma (BCC) 05/08/2019   left ear (cx17fu)   Non-STEMI (non-ST elevated myocardial infarction) ([place]) 11/2001   a. Proximal LAD tandem  lesions -- long DES stent covering both;; b. Jan 2017: mRCA PCI, mLAD PCI   Parkinson's disease [facility])    sees Dr. Ellouise Newer   Persistent sinus bradycardia    Personal history of colonic adenomas 09/06/2012   Psoriasis    Resting HR in 54s   SCC (squamous cell carcinoma) 11/13/2001   right upper outer arm (txpbx)   SCC (squamous cell carcinoma) 11/13/2009   right upper ear rim (cx3 30fu)   SCC (squamous cell carcinoma) 01/20/2010   right nose (cx65fu)   SCC (squamous cell carcinoma) 03/28/2014   right side of nose ( cx36fu)   SCC (squamous cell carcinoma) 06/03/2016   right side of nose   SCC (squamous cell carcinoma) 05/08/2019   right ear inferior (cx65fu) cis   SCCA (squamous cell carcinoma) of skin 04/23/2021   Right Temporal Scalp (well diff) (tx p bx)   Spasm of esophagus    Squamous cell carcinoma in situ (SCCIS) 05/08/2019   Right Ear Inferior   Squamous cell carcinoma of skin 11/08/2001   top right rim ear    SVT (supraventricular tachycardia) ([place]) 2003   Vertigo, benign positional    Past Surgical History:  Procedure Laterality Date   CARDIAC CATHETERIZATION N/A 02/26/2015   Procedure: Left Heart Cath and Coronary  Angiography;  Surgeon: Leonie Man, MD: mRCA 99% --> PCI. mLAD 75%-> FFR Guided PCI, Mod ISR in pLAD & pRCA   CARDIAC CATHETERIZATION N/A 02/26/2015   Procedure: Coronary Stent Intervention;  Surgeon: Leonie Man, MD;  Location: [facility] INVASIVE CV LAB: mRCA Promus Premier DES 3.0 x 12 (3.5), mLAD Promus Premier DES 2.75 x 20 (3.0)   CARDIAC CATHETERIZATION N/A 02/26/2015   Procedure: Intravascular Pressure Wire/FFR Study;  Surgeon: Leonie Man, MD;  Location: [place] CV LAB;  Service: Cardiovascular: LAD 75% - FFR + -> PCI   CATARACT EXTRACTION  [id number]   COLONOSCOPY  12/16/2017   per Dr. Carlean Purl, adenomatous polyps, no repeats due to age    [place]  LAD - 2003, RCA 3 & 7/ '09   Cypher 3.0 x 32 mid LAD; Cyper  2.75 x 13 - distal RCA, 3.0 x 18 Prox RCA   ESOPHAGOGASTRODUODENOSCOPY  08-11-02   esophageal dilation per Dr. Carlean Purl   HERNIA REPAIR     right inguinal    NM MYOVIEW LTD  Nov 2014   ~8 METS; EF 60%, no ischemia or infarction   PERCUTANEOUS CORONARY STENT INTERVENTION (PCI-S)  11/23/2001   NSTEMI: Prox-Mid LAD tandem ~80% lesions on either side of D1 (with 80% lesion) -- Cypher DES 3.0 mm x 33 m (covering both lesions)    PERCUTANEOUS CORONARY STENT INTERVENTION (PCI-S)  04/27/2007   Unstable Angina: Distal RCA 95%: Cypher 2.75x13  (2.8 mm); residual focal ~60%ISR in LAD stent, 60% D1 ostial (no PTCA on LAD due to no ischemia on ST)   PERCUTANEOUS CORONARY STENT INTERVENTION (PCI-S)  09/21/2007   Bradycardia & Unstable Angina: Prox RCA 70% - PCI Cypher DES 3.0 mm x 18 mm  (3.25 mm); ostial 60-70% jailed D1. LAD & distal RCA stents patent   SHOULDER SURGERY     left rotator cuff, Dr. Gladstone Lighter   TRANSTHORACIC ECHOCARDIOGRAM  March 2009   Normal LV size and function, EF 55%. Mild MR; mild RV dilation.   TRANSTHORACIC ECHOCARDIOGRAM  02/2015   EF 60-65%. Moderate concentric LVH. Normal function with normal regional wall motion. GR 1 DD   WRIST FRACTURE SURGERY     right   Patient Active Problem List   Diagnosis Date Noted   Myalgia due to statin 01/27/2021   Parkinson's disease ([place]) 11/07/2020   Alzheimer's dementia ([place]) 11/07/2020   Intermittent claudication ([place]) 12/01/2017   Trochanteric bursitis of right hip 09/04/2017   Fuchs' corneal dystrophy 08/14/2016   Essential hypertension -> allowing permissive hypertension.  Medication stopped because of fatigue and orthostatic dizziness. 03/05/2015   NSTEMI (non-ST elevated myocardial infarction) [facility])    Coronary artery disease involving native coronary artery of native heart without angina pectoris    Vitamin D deficiency 07/04/2014   Fatigue 01/08/2014   Obesity (BMI 30-39.9) 01/12/2013   DM (diabetes mellitus), type 2 with  complications - CAD    Dyslipidemia, goal LDL below 70    Exertional shortness of breath 01/12/2012   Chronotropic incompetence 12/25/2011   Vertigo with mild postural lightheadedness. 08/13/2009   ERECTILE DYSFUNCTION, ORGANIC 05/01/2009   GERD 04/18/2008   BPH with urinary obstruction 07/27/2007   CAD S/P percutaneous coronary angioplasty - LADx2, & RCA x 3 07/12/2001    Class: History of    REFERRING DIAG: G30.1,F02.818 (ICD-10-CM) - Alzheimer's dementia, late onset, with behavioral disturbance ([place]) G20 (ICD-10-CM) - Parkinsonism, unspecified Parkinsonism type ([place])   THERAPY DIAG:  Unsteadiness  on feet  Muscle weakness (generalized)  Other abnormalities of gait and mobility  Rationale for Evaluation and Treatment Rehabilitation  PERTINENT HISTORY: hypertension, hyperlipidemia, CAD, diabetes. Neuropsychological evaluation in 01/2020 indicated memory storage type problems, executive dysfunction, diminished processing efficiency, likely due to Alzheimer's disease. His parkinsonism suggests there may be a Lewy Body spectrum component.   PRECAUTIONS: fall  SUBJECTIVE: Patient reports HEP is "okay. I cannot see them well. The print is too small". No falls but pt states "I trip all day".   PAIN:  Are you having pain? No  TODAY'S TREATMENT:  Ther ex SciFit multi-peaks level 3 for 8 minutes using BUE/BLEs for neural priming for reciprocal movement, dynamic cardiovascular warmup and increased amplitude of stepping. Noted decreased amplitude of LLE knee extension > RLE. RPE of 5/10 following activity   Gait Training Gait pattern: step to pattern, step through pattern, decreased arm swing- Right, decreased arm swing- Left, decreased step length- Left, decreased stride length, decreased hip/knee flexion- Right, decreased hip/knee flexion- Left, decreased ankle dorsiflexion- Right, decreased ankle dorsiflexion- Left, Right foot flat, Left foot flat, shuffling, trunk flexed, poor foot  clearance- Right, and poor foot clearance- Left Distance walked: Into out of clinic  Assistive device utilized: Walker - 4 wheeled Level of assistance: SBA Comments: Pt ambulated into clinic without AD and maintained BUEs flexed and adducted. Noted decreased step clearance/length of LLE > RLE. Noted poor eccentric control of LLE w/IC   Gait pattern: step through pattern, decreased step length- Left, decreased stride length, decreased hip/knee flexion- Right, decreased hip/knee flexion- Left, decreased ankle dorsiflexion- Left, Right foot flat, Left foot flat, shuffling, poor foot clearance- Right, and poor foot clearance- Left Distance walked: 200' around clinic and >200' outside on sidewalk  Assistive device utilized: Environmental consultant - 4 wheeled Level of assistance: SBA Comments: Practiced use of rollator on level and unlevel surfaces for improved safety at home and reduced fall risk. Provided visual demonstration of brake management and pt able to teach back and demonstrate throughout session w/min cues. Pt demonstrates increased step length/clearance bilaterally w/use of rollator w/increased cadence as well. Pt able to turn to sit to rollator w/S* safely and navigated small inclines/declines on sidewalk well. Pt's wife reports having rollator in their garage that pt has not used due to refusal. At this time, recommending pt use rollator at all times when outside of the home and for performing walking program. Pt verbalized understanding.     PATIENT EDUCATION: Education details: Continue HEP, safe use of rollator and encouragement to use rollator at all times  Person educated: Patient and Spouse Education method: Explanation, Demonstration, and Verbal cues Education comprehension: verbalized understanding and needs further education     HOME EXERCISE PROGRAM: Access Code: [email or web address] URL: [email or web address] Date: 09/10/2021 Prepared by: Merry Lofty  Exercises - Sit to Stand with  Counter Support  - 1 x daily - 7 x weekly - 3 sets - 10 reps - Seated Heel Raise  - 1 x daily - 7 x weekly - 3 sets - 10 reps - Seated Toe Raise  - 1 x daily - 7 x weekly - 3 sets - 10 reps - Seated Scapular Retraction  - 1 x daily - 7 x weekly - 3 sets - 10 reps - Seated March  - 1 x daily - 7 x weekly - 3 sets - 10 reps - Seated Long Arc Quad  - 1 x daily - 7 x weekly - 3 sets - 10 reps  GOALS: Goals reviewed with patient? Yes   SHORT TERM GOALS: Target date: 10/02/2021   Pt will perform initial HEP w/min A from wife for improved strength, balance, transfers and gait.   Baseline: not established on eval Goal status: INITIAL   2.  Pt will improve gait velocity to at least 1.4 ft/s w/LRAD and S* for improved gait efficiency and safety   Baseline: 1.19 ft/s w/SPC and CGA Goal status: INITIAL   3.  TUG to be assessed and STG/LTG written  Baseline: 45.56s with CGA Goal status: MET   4.  Pt will improve 5 x STS to less than or equal to 48 seconds w/BUE support to demonstrate improved functional strength and transfer efficiency.    Baseline: 55.06s w/BUE support  Goal status: INITIAL   5.  Pt will ambulate 150' or greater w/LRAD and S* for improved safety with functional mobility  Baseline: ambulating w/SPC and CGA Goal status: INITIAL  6. Pt will improve TUG to </= 37s secs to demonstrated reduced fall risk  Baseline: 45.56s   Goal Status: INITIAL       LONG TERM GOALS: Target date: 10/30/2021   Pt will perform final HEP w/min A from wife for improved strength, balance, transfers and gait. Baseline:  Goal status: INITIAL   2.  Pt will improve TUG to </= 28 secs to demonstrated reduced fall risk  Baseline: 45.56s Goal status: INITIAL   3.  Pt will improve 5 x STS to less than or equal to 41 seconds w/BUE support to demonstrate improved functional strength and transfer efficiency.    Baseline: 55.06s w/BUE support  Goal status: INITIAL   4.  Pt and wife will  verbalize and demonstrate fall prevention strategies in the home for reduced fall risk and improved safety  Baseline:  Goal status: INITIAL   5.  Pt will improve gait velocity to at least 1.9 ft/s w/LRAD and S* for improved gait efficiency and reduced fall risk   Baseline: 1.19 ft/s w/SPC and CGA Goal status: INITIAL   6.  Pt will ambulate 300' or greater w/LRAD and S* for improved global endurance and functional mobility independence  Baseline: ambulating w/SPC and CGA Goal status: INITIAL   ASSESSMENT:   CLINICAL IMPRESSION: Emphasis of skilled PT session on endurance and gait training w/rollator. Pt reported difficulty performing HEP due to not being able to read the words, reprinted HEP w/larger print to allow pt to read. Pt demonstrates improved amplitude of stepping on SciFit without need for cues. W/rollator, pt exhibits increased step length/clearance bilaterally but noted foot slap of LLE w/IC to LR. Strongly encouraging pt to use rollator for all community mobility and walking program for improved endurance and reduced fall risk w/ambulation. Continue POC.     OBJECTIVE IMPAIRMENTS Abnormal gait, decreased activity tolerance, decreased balance, decreased cognition, decreased coordination, decreased endurance, decreased knowledge of condition, decreased knowledge of use of DME, decreased mobility, difficulty walking, decreased strength, decreased safety awareness, and pain.    ACTIVITY LIMITATIONS carrying, lifting, bending, standing, squatting, sleeping, stairs, transfers, bed mobility, reach over head, and locomotion level   PARTICIPATION LIMITATIONS: meal prep, cleaning, laundry, medication management, personal finances, interpersonal relationship, driving, shopping, community activity, and yard work   PERSONAL FACTORS Age, Behavior pattern, Fitness, Past/current experiences, Time since onset of injury/illness/exacerbation, Transportation, and 1 comorbidity: Dementia  are also  affecting patient's functional outcome.    REHAB POTENTIAL: Fair due to poor prognosis associated w/Lewy Body Dementia    CLINICAL DECISION  MAKING: Evolving/moderate complexity   EVALUATION COMPLEXITY: Moderate   PLAN: PT FREQUENCY: 2x/week   PT DURATION: 8 weeks   PLANNED INTERVENTIONS: Therapeutic exercises, Therapeutic activity, Neuromuscular re-education, Balance training, Gait training, Patient/Family education, Self Care, Joint mobilization, Stair training, DME instructions, Manual therapy, and Re-evaluation   PLAN FOR NEXT SESSION: Did pt bring personal rollator? Obstacle course w/rollator, eccentric heel taps, sit <>Stands, step length, scifit for large amplitude coordination, turns, lateral stepping   Keon Benscoter E Jaden Abreu, PT 09/12/2021, 9:34 AM

## 2021-09-17 ENCOUNTER — Ambulatory Visit: Payer: Medicare HMO

## 2021-09-17 DIAGNOSIS — R2681 Unsteadiness on feet: Secondary | ICD-10-CM

## 2021-09-17 DIAGNOSIS — G20 Parkinson's disease: Secondary | ICD-10-CM | POA: Diagnosis not present

## 2021-09-17 DIAGNOSIS — M6281 Muscle weakness (generalized): Secondary | ICD-10-CM

## 2021-09-17 DIAGNOSIS — G301 Alzheimer's disease with late onset: Secondary | ICD-10-CM | POA: Diagnosis not present

## 2021-09-17 DIAGNOSIS — R69 Illness, unspecified: Secondary | ICD-10-CM | POA: Diagnosis not present

## 2021-09-17 DIAGNOSIS — R2689 Other abnormalities of gait and mobility: Secondary | ICD-10-CM

## 2021-09-17 NOTE — Therapy (Signed)
OUTPATIENT PHYSICAL THERAPY TREATMENT NOTE   Patient Name: ANDRICK RUST MRN: [id number] DOB:07/06/1940, 81 y.o., male Today's Date: 09/17/2021  PCP: Alysia Penna, MD REFERRING PROVIDER: Alysia Penna, MD  END OF SESSION:   PT End of Session - 09/17/21 0837     Visit Number 4    Number of Visits 17    Date for PT Re-Evaluation 11/06/21    Authorization Type [employer]    Progress Note Due on Visit 10    PT Start Time 0845    PT Stop Time 0925    PT Time Calculation (min) 40 min    Equipment Utilized During Treatment Gait belt    Activity Tolerance Patient tolerated treatment well    Behavior During Therapy [facility] for tasks assessed/performed              Past Medical History:  Diagnosis Date   Alzheimer's dementia [facility])    sees Dr. Ellouise Newer   CAD S/P percutaneous coronary angioplasty 10/'03; 3/'09; 7/*09; 1/'17    a. Dr. Ellyn Hack; '03 - Cypher DES 3.0 mm 33 mm proximal-mid LAD (details D1 with ostial 60%); 3/'09 dRCA 2.75 mm x 13 mm Cypher DES (2.8 mm); 7/'09 pRCA 3.0 mm x 18 m Cypher DES (3.25 mm);; (2009) 2D Echo - EF 55%, (November 2014) nonischemic Myoview;; b. Promus DES to RCA and mid LAD 02/26/2015   Chronotropic incompetence 12/2011   ETT 11/2017: Normal blood pressure response.  No EKG changes.  Exercise stopped due to fatigue and dyspnea.  Heart rate increased to low 90s from 50s.  Suggest chronotropic incompetence.  Severely impaired exercise capacity. -->  Results reviewed with electrophysiology: Still not indication for pacemaker.   Diabetes mellitus type 2 in obese [facility])    CAD   Diverticulosis    Dyslipidemia, goal LDL below 70    on [employer] study medicaion.   ED (erectile dysfunction)    GERD (gastroesophageal reflux disease)    Hemorrhoids 07/2002   Internal and External   Insomnia    Nodular basal cell carcinoma (BCC) 05/08/2019   left ear (cx28f)   Non-STEMI (non-ST elevated [place] infarction[place] 11/[place]   a. Proximal LAD tandem  lesions -- long DES stent covering both;; b. Jan 2017: mRCA PCI, mLAD PCI   Parkinson's disease ([facility]    sees Dr. KEllouise Newer  Persistent sinus bradycardia    Personal history of colonic adenomas 09/06/2012   Psoriasis    Resting HR in 54s   SCC (squamous cell carcinoma) 11/13/2001   right upper outer arm (txpbx)   SCC (squamous cell carcinoma) 11/13/2009   right upper ear rim (cx3 580f   SCC (squamous cell carcinoma) 01/20/2010   right nose ([id number]  SCC (squamous cell carcinoma) 03/28/2014   right side of nose ( [id number] SCC (squamous cell carcinoma) 06/03/2016   right side of nose   SCC (squamous cell carcinoma) 05/08/2019   right ear inferior ([id number]   SCCA (squamous cell carcinoma) of skin 04/23/2021   Right Temporal Scalp (well diff) (tx p bx)   Spasm of esophagus    Squamous cell carcinoma in situ (SCCIS) 05/08/2019   Right Ear Inferior   Squamous cell carcinoma of skin 11/08/2001   top right rim ear    SVT (supraventricular tachycardia) (HCC) [place]   Vertigo, benign positional    Past Surgical History:  Procedure Laterality Date   CARDIAC CATHETERIZATION N/A 02/26/2015   Procedure: Left Heart Cath and Coronary  Angiography;  Surgeon: Leonie Man, MD: mRCA 99% --> PCI. mLAD 75%-> FFR Guided PCI, Mod ISR in pLAD & pRCA   CARDIAC CATHETERIZATION N/A 02/26/2015   Procedure: Coronary Stent Intervention;  Surgeon: Leonie Man, MD;  Location: [facility] INVASIVE CV LAB: mRCA Promus Premier DES 3.0 x 12 (3.5), mLAD Promus Premier DES 2.75 x 20 (3.0)   CARDIAC CATHETERIZATION N/A 02/26/2015   Procedure: Intravascular Pressure Wire/FFR Study;  Surgeon: Leonie Man, MD;  Location: [place] CV LAB;  Service: Cardiovascular: LAD 75% - FFR + -> PCI   CATARACT EXTRACTION  [id number]   COLONOSCOPY  12/16/2017   per Dr. Carlean Purl, adenomatous polyps, no repeats due to age    [place]  LAD - 2003, RCA 3 & 7/ '09   Cypher 3.0 x 32 mid LAD; Cyper  2.75 x 13 - distal RCA, 3.0 x 18 Prox RCA   ESOPHAGOGASTRODUODENOSCOPY  08-11-02   esophageal dilation per Dr. Carlean Purl   HERNIA REPAIR     right inguinal    NM MYOVIEW LTD  Nov 2014   ~8 METS; EF 60%, no ischemia or infarction   PERCUTANEOUS CORONARY STENT INTERVENTION (PCI-S)  11/23/2001   NSTEMI: Prox-Mid LAD tandem ~80% lesions on either side of D1 (with 80% lesion) -- Cypher DES 3.0 mm x 33 m (covering both lesions)    PERCUTANEOUS CORONARY STENT INTERVENTION (PCI-S)  04/27/2007   Unstable Angina: Distal RCA 95%: Cypher 2.75x13  (2.8 mm); residual focal ~60%ISR in LAD stent, 60% D1 ostial (no PTCA on LAD due to no ischemia on ST)   PERCUTANEOUS CORONARY STENT INTERVENTION (PCI-S)  09/21/2007   Bradycardia & Unstable Angina: Prox RCA 70% - PCI Cypher DES 3.0 mm x 18 mm  (3.25 mm); ostial 60-70% jailed D1. LAD & distal RCA stents patent   SHOULDER SURGERY     left rotator cuff, Dr. Gladstone Lighter   TRANSTHORACIC ECHOCARDIOGRAM  March 2009   Normal LV size and function, EF 55%. Mild MR; mild RV dilation.   TRANSTHORACIC ECHOCARDIOGRAM  02/2015   EF 60-65%. Moderate concentric LVH. Normal function with normal regional wall motion. GR 1 DD   WRIST FRACTURE SURGERY     right   Patient Active Problem List   Diagnosis Date Noted   Myalgia due to statin 01/27/2021   Parkinson's disease ([place]) 11/07/2020   Alzheimer's dementia ([place]) 11/07/2020   Intermittent claudication ([place]) 12/01/2017   Trochanteric bursitis of right hip 09/04/2017   Fuchs' corneal dystrophy 08/14/2016   Essential hypertension -> allowing permissive hypertension.  Medication stopped because of fatigue and orthostatic dizziness. 03/05/2015   NSTEMI (non-ST elevated myocardial infarction) [facility])    Coronary artery disease involving native coronary artery of native heart without angina pectoris    Vitamin D deficiency 07/04/2014   Fatigue 01/08/2014   Obesity (BMI 30-39.9) 01/12/2013   DM (diabetes mellitus), type 2 with  complications - CAD    Dyslipidemia, goal LDL below 70    Exertional shortness of breath 01/12/2012   Chronotropic incompetence 12/25/2011   Vertigo with mild postural lightheadedness. 08/13/2009   ERECTILE DYSFUNCTION, ORGANIC 05/01/2009   GERD 04/18/2008   BPH with urinary obstruction 07/27/2007   CAD S/P percutaneous coronary angioplasty - LADx2, & RCA x 3 07/12/2001    Class: History of    REFERRING DIAG: G30.1,F02.818 (ICD-10-CM) - Alzheimer's dementia, late onset, with behavioral disturbance ([place]) G20 (ICD-10-CM) - Parkinsonism, unspecified Parkinsonism type ([place])   THERAPY DIAG:  Unsteadiness  on feet  Muscle weakness (generalized)  Other abnormalities of gait and mobility  Rationale for Evaluation and Treatment Rehabilitation  PERTINENT HISTORY: hypertension, hyperlipidemia, CAD, diabetes. Neuropsychological evaluation in 01/2020 indicated memory storage type problems, executive dysfunction, diminished processing efficiency, likely due to Alzheimer's disease. His parkinsonism suggests there may be a Lewy Body spectrum component.   PRECAUTIONS: fall  SUBJECTIVE: Patient reports doing well. Brought rollator from home. Denies falls, but states that he has near falls with turns where the "right leg stays behind."   PAIN:  Are you having pain? No  TODAY'S TREATMENT:  Gait -115' rollator + CGA -115' rollator + CGA  NMR:  -blocked practice STS with rollator   -blocked practice anterior step over 2" block B LE 2x10  -scifit x8 mins B UE/LE  PATIENT EDUCATION: Education details: safe use of Rollator   Person educated: Patient and Spouse Education method: Explanation, Demonstration, and Verbal cues Education comprehension: verbalized understanding and needs further education     HOME EXERCISE PROGRAM: Access Code: [email or web address] URL: [email or web address] Date: 09/10/2021 Prepared by: Estevan Ryder  Exercises - Sit to Stand with Counter Support  - 1 x  daily - 7 x weekly - 3 sets - 10 reps - Seated Heel Raise  - 1 x daily - 7 x weekly - 3 sets - 10 reps - Seated Toe Raise  - 1 x daily - 7 x weekly - 3 sets - 10 reps - Seated Scapular Retraction  - 1 x daily - 7 x weekly - 3 sets - 10 reps - Seated March  - 1 x daily - 7 x weekly - 3 sets - 10 reps - Seated Long Arc Quad  - 1 x daily - 7 x weekly - 3 sets - 10 reps     GOALS: Goals reviewed with patient? Yes   SHORT TERM GOALS: Target date: 10/02/2021   Pt will perform initial HEP w/min A from wife for improved strength, balance, transfers and gait.   Baseline: not established on eval Goal status: INITIAL   2.  Pt will improve gait velocity to at least 1.4 ft/s w/LRAD and S* for improved gait efficiency and safety   Baseline: 1.19 ft/s w/SPC and CGA Goal status: INITIAL   3.  TUG to be assessed and STG/LTG written  Baseline: 45.56s with CGA Goal status: MET   4.  Pt will improve 5 x STS to less than or equal to 48 seconds w/BUE support to demonstrate improved functional strength and transfer efficiency.    Baseline: 55.06s w/BUE support  Goal status: INITIAL   5.  Pt will ambulate 150' or greater w/LRAD and S* for improved safety with functional mobility  Baseline: ambulating w/SPC and CGA Goal status: INITIAL  6. Pt will improve TUG to </= 37s secs to demonstrated reduced fall risk  Baseline: 45.56s   Goal Status: INITIAL       LONG TERM GOALS: Target date: 10/30/2021   Pt will perform final HEP w/min A from wife for improved strength, balance, transfers and gait. Baseline:  Goal status: INITIAL   2.  Pt will improve TUG to </= 28 secs to demonstrated reduced fall risk  Baseline: 45.56s Goal status: INITIAL   3.  Pt will improve 5 x STS to less than or equal to 41 seconds w/BUE support to demonstrate improved functional strength and transfer efficiency.    Baseline: 55.06s w/BUE support  Goal status: INITIAL   4.  Pt and wife will verbalize  and demonstrate  fall prevention strategies in the home for reduced fall risk and improved safety  Baseline:  Goal status: INITIAL   5.  Pt will improve gait velocity to at least 1.9 ft/s w/LRAD and S* for improved gait efficiency and reduced fall risk   Baseline: 1.19 ft/s w/SPC and CGA Goal status: INITIAL   6.  Pt will ambulate 300' or greater w/LRAD and S* for improved global endurance and functional mobility independence  Baseline: ambulating w/SPC and CGA Goal status: INITIAL   ASSESSMENT:   CLINICAL IMPRESSION: Patient seen for skilled PT session with emphasis on rollator training and large amplitude coordination. He demonstrates excellent carryover from blocked practice sit <> stand with rollator sequencing using the brakes and pushing up from surfaces. He required intermittent verbal cuing. Using rollator, much improved step length and foot clearance noted, even through turns. Increased shuffling gait is observed with tight turns weaving through cones, but no LOB. Continue POC.     OBJECTIVE IMPAIRMENTS Abnormal gait, decreased activity tolerance, decreased balance, decreased cognition, decreased coordination, decreased endurance, decreased knowledge of condition, decreased knowledge of use of DME, decreased mobility, difficulty walking, decreased strength, decreased safety awareness, and pain.    ACTIVITY LIMITATIONS carrying, lifting, bending, standing, squatting, sleeping, stairs, transfers, bed mobility, reach over head, and locomotion level   PARTICIPATION LIMITATIONS: meal prep, cleaning, laundry, medication management, personal finances, interpersonal relationship, driving, shopping, community activity, and yard work   PERSONAL FACTORS Age, Behavior pattern, Fitness, Past/current experiences, Time since onset of injury/illness/exacerbation, Transportation, and 1 comorbidity: Dementia  are also affecting patient's functional outcome.    REHAB POTENTIAL: Fair due to poor prognosis associated  w/Lewy Body Dementia    CLINICAL DECISION MAKING: Evolving/moderate complexity   EVALUATION COMPLEXITY: Moderate   PLAN: PT FREQUENCY: 2x/week   PT DURATION: 8 weeks   PLANNED INTERVENTIONS: Therapeutic exercises, Therapeutic activity, Neuromuscular re-education, Balance training, Gait training, Patient/Family education, Self Care, Joint mobilization, Stair training, DME instructions, Manual therapy, and Re-evaluation   PLAN FOR NEXT SESSION: Obstacle course w/rollator, eccentric heel taps, sit <>Stands, step length, scifit for large amplitude coordination, turns, lateral stepping, can he remember sit<> stand sequence with rollator? Pwr! Moves?   Debbora Dus, PT Debbora Dus, PT, DPT, CBIS  09/17/2021, 9:29 AM

## 2021-09-19 ENCOUNTER — Encounter: Payer: Self-pay | Admitting: Physical Therapy

## 2021-09-19 ENCOUNTER — Ambulatory Visit: Payer: Medicare HMO | Admitting: Physical Therapy

## 2021-09-19 DIAGNOSIS — R69 Illness, unspecified: Secondary | ICD-10-CM | POA: Diagnosis not present

## 2021-09-19 DIAGNOSIS — G20 Parkinson's disease: Secondary | ICD-10-CM | POA: Diagnosis not present

## 2021-09-19 DIAGNOSIS — R2681 Unsteadiness on feet: Secondary | ICD-10-CM

## 2021-09-19 DIAGNOSIS — G301 Alzheimer's disease with late onset: Secondary | ICD-10-CM | POA: Diagnosis not present

## 2021-09-19 DIAGNOSIS — H18512 Endothelial corneal dystrophy, left eye: Secondary | ICD-10-CM | POA: Diagnosis not present

## 2021-09-19 DIAGNOSIS — M6281 Muscle weakness (generalized): Secondary | ICD-10-CM

## 2021-09-19 DIAGNOSIS — R2689 Other abnormalities of gait and mobility: Secondary | ICD-10-CM | POA: Diagnosis not present

## 2021-09-19 DIAGNOSIS — Z961 Presence of intraocular lens: Secondary | ICD-10-CM | POA: Diagnosis not present

## 2021-09-19 DIAGNOSIS — Z947 Corneal transplant status: Secondary | ICD-10-CM | POA: Diagnosis not present

## 2021-09-19 NOTE — Therapy (Signed)
OUTPATIENT PHYSICAL THERAPY TREATMENT NOTE   Patient Name: Joe Murray MRN: [id number] DOB:[birth year], 81 y.o., male Today's Date: 09/19/2021  PCP: Alysia Penna, MD REFERRING PROVIDER: Alysia Penna, MD  END OF SESSION:   PT End of Session - 09/19/21 0844     Visit Number 5    Number of Visits 17    Date for PT Re-Evaluation 11/06/21    Authorization Type [employer]    Progress Note Due on Visit 10    PT Start Time 0841    PT Stop Time 0925    PT Time Calculation (min) 44 min    Equipment Utilized During Treatment Gait belt    Activity Tolerance Patient tolerated treatment well    Behavior During Therapy [facility] for tasks assessed/performed               Past Medical History:  Diagnosis Date   Alzheimer's dementia [facility])    sees Dr. Ellouise Newer   CAD S/P percutaneous coronary angioplasty 10/'03; 3/'09; 7/*09; 1/'17    a. Dr. Ellyn Hack; '03 - Cypher DES 3.0 mm 33 mm proximal-mid LAD (details D1 with ostial 60%); 3/'09 dRCA 2.75 mm x 13 mm Cypher DES (2.8 mm); 7/'09 pRCA 3.0 mm x 18 m Cypher DES (3.25 mm);; (2009) 2D Echo - EF 55%, (November 2014) nonischemic Myoview;; b. Promus DES to RCA and mid LAD 02/26/2015   Chronotropic incompetence 12/2011   ETT 11/2017: Normal blood pressure response.  No EKG changes.  Exercise stopped due to fatigue and dyspnea.  Heart rate increased to low 90s from 50s.  Suggest chronotropic incompetence.  Severely impaired exercise capacity. -->  Results reviewed with electrophysiology: Still not indication for pacemaker.   Diabetes mellitus type 2 in obese [facility])    CAD   Diverticulosis    Dyslipidemia, goal LDL below 70    on [employer] study medicaion.   ED (erectile dysfunction)    GERD (gastroesophageal reflux disease)    Hemorrhoids 07/2002   Internal and External   Insomnia    Nodular basal cell carcinoma (BCC) 05/08/2019   left ear (cx3fu)   Non-STEMI (non-ST elevated myocardial infarction) ([place]) 11/2001   a. Proximal LAD tandem  lesions -- long DES stent covering both;; b. Jan 2017: mRCA PCI, mLAD PCI   Parkinson's disease [facility])    sees Dr. Ellouise Newer   Persistent sinus bradycardia    Personal history of colonic adenomas 09/06/2012   Psoriasis    Resting HR in 54s   SCC (squamous cell carcinoma) 11/13/2001   right upper outer arm (txpbx)   SCC (squamous cell carcinoma) 11/13/2009   right upper ear rim (cx3 62fu)   SCC (squamous cell carcinoma) 01/20/2010   right nose (cx69fu)   SCC (squamous cell carcinoma) 03/28/2014   right side of nose ( cx47fu)   SCC (squamous cell carcinoma) 06/03/2016   right side of nose   SCC (squamous cell carcinoma) 05/08/2019   right ear inferior (cx63fu) cis   SCCA (squamous cell carcinoma) of skin 04/23/2021   Right Temporal Scalp (well diff) (tx p bx)   Spasm of esophagus    Squamous cell carcinoma in situ (SCCIS) 05/08/2019   Right Ear Inferior   Squamous cell carcinoma of skin 11/08/2001   top right rim ear    SVT (supraventricular tachycardia) ([place]) 2003   Vertigo, benign positional    Past Surgical History:  Procedure Laterality Date   CARDIAC CATHETERIZATION N/A 02/26/2015   Procedure: Left Heart Cath and  Coronary Angiography;  Surgeon: Leonie Man, MD: mRCA 99% --> PCI. mLAD 75%-> FFR Guided PCI, Mod ISR in pLAD & pRCA   CARDIAC CATHETERIZATION N/A 02/26/2015   Procedure: Coronary Stent Intervention;  Surgeon: Leonie Man, MD;  Location: [facility] INVASIVE CV LAB: mRCA Promus Premier DES 3.0 x 12 (3.5), mLAD Promus Premier DES 2.75 x 20 (3.0)   CARDIAC CATHETERIZATION N/A 02/26/2015   Procedure: Intravascular Pressure Wire/FFR Study;  Surgeon: Leonie Man, MD;  Location: [place] CV LAB;  Service: Cardiovascular: LAD 75% - FFR + -> PCI   CATARACT EXTRACTION  [id number]   COLONOSCOPY  12/16/2017   per Dr. Carlean Purl, adenomatous polyps, no repeats due to age    [place]  LAD - 2003, RCA 3 & 7/ '09   Cypher 3.0 x 32 mid LAD; Cyper  2.75 x 13 - distal RCA, 3.0 x 18 Prox RCA   ESOPHAGOGASTRODUODENOSCOPY  08-11-02   esophageal dilation per Dr. Carlean Purl   HERNIA REPAIR     right inguinal    NM MYOVIEW LTD  Nov 2014   ~8 METS; EF 60%, no ischemia or infarction   PERCUTANEOUS CORONARY STENT INTERVENTION (PCI-S)  11/23/2001   NSTEMI: Prox-Mid LAD tandem ~80% lesions on either side of D1 (with 80% lesion) -- Cypher DES 3.0 mm x 33 m (covering both lesions)    PERCUTANEOUS CORONARY STENT INTERVENTION (PCI-S)  04/27/2007   Unstable Angina: Distal RCA 95%: Cypher 2.75x13  (2.8 mm); residual focal ~60%ISR in LAD stent, 60% D1 ostial (no PTCA on LAD due to no ischemia on ST)   PERCUTANEOUS CORONARY STENT INTERVENTION (PCI-S)  09/21/2007   Bradycardia & Unstable Angina: Prox RCA 70% - PCI Cypher DES 3.0 mm x 18 mm  (3.25 mm); ostial 60-70% jailed D1. LAD & distal RCA stents patent   SHOULDER SURGERY     left rotator cuff, Dr. Gladstone Lighter   TRANSTHORACIC ECHOCARDIOGRAM  March 2009   Normal LV size and function, EF 55%. Mild MR; mild RV dilation.   TRANSTHORACIC ECHOCARDIOGRAM  02/2015   EF 60-65%. Moderate concentric LVH. Normal function with normal regional wall motion. GR 1 DD   WRIST FRACTURE SURGERY     right   Patient Active Problem List   Diagnosis Date Noted   Myalgia due to statin 01/27/2021   Parkinson's disease ([place]) 11/07/2020   Alzheimer's dementia ([place]) 11/07/2020   Intermittent claudication ([place]) 12/01/2017   Trochanteric bursitis of right hip 09/04/2017   Fuchs' corneal dystrophy 08/14/2016   Essential hypertension -> allowing permissive hypertension.  Medication stopped because of fatigue and orthostatic dizziness. 03/05/2015   NSTEMI (non-ST elevated myocardial infarction) [facility])    Coronary artery disease involving native coronary artery of native heart without angina pectoris    Vitamin D deficiency 07/04/2014   Fatigue 01/08/2014   Obesity (BMI 30-39.9) 01/12/2013   DM (diabetes mellitus), type 2 with  complications - CAD    Dyslipidemia, goal LDL below 70    Exertional shortness of breath 01/12/2012   Chronotropic incompetence 12/25/2011   Vertigo with mild postural lightheadedness. 08/13/2009   ERECTILE DYSFUNCTION, ORGANIC 05/01/2009   GERD 04/18/2008   BPH with urinary obstruction 07/27/2007   CAD S/P percutaneous coronary angioplasty - LADx2, & RCA x 3 07/12/2001    Class: History of    REFERRING DIAG: G30.1,F02.818 (ICD-10-CM) - Alzheimer's dementia, late onset, with behavioral disturbance ([place]) G20 (ICD-10-CM) - Parkinsonism, unspecified Parkinsonism type ([place])   THERAPY DIAG:  Unsteadiness on feet  Muscle weakness (generalized)  Other abnormalities of gait and mobility  Rationale for Evaluation and Treatment Rehabilitation  PERTINENT HISTORY: hypertension, hyperlipidemia, CAD, diabetes. Neuropsychological evaluation in 01/2020 indicated memory storage type problems, executive dysfunction, diminished processing efficiency, likely due to Alzheimer's disease. His parkinsonism suggests there may be a Lewy Body spectrum component.   PRECAUTIONS: fall  SUBJECTIVE: Nothing new, no falls. Has not been using the rollator in the house.   PAIN:  Are you having pain? No  TODAY'S TREATMENT:   Ther ex SciFit level 2.5 > 3 for 6 minutes using BUE/BLEs for reciprocal movement patterns, ROM, activity tolerance. Noted decreased amplitude of LLE knee extension > RLE. Cues throughout for "kicking" out his legs. Pt reporting feeling fatigued afterwards.   Needing CGA for sit > stand after and cues for proper UE placement.   Gait Training Gait pattern: step to pattern, step through pattern, decreased arm swing- Right, decreased arm swing- Left, decreased step length- Left, decreased stride length, decreased hip/knee flexion- Right, decreased hip/knee flexion- Left, decreased ankle dorsiflexion- Right, decreased ankle dorsiflexion- Left, Right foot flat, Left foot flat, shuffling, trunk  flexed, poor foot clearance- Right, and poor foot clearance- Left Distance walked: Into out of clinic, plus additional distances  Assistive device utilized: Walker - 4 wheeled Level of assistance: SBA Comments: Cued for foot clearance, stride length and heel strike with rollator.  Needs reminder cues for proper brake management with sit <> stands.   NMR: Pt performs PWR! Moves in seated position    PWR! Up for improved posture x10 reps, cues for scap retraction, opening up his hands.   PWR! Rock for improved weighshifting x10 reps each side, reaching across body   PWR! Twist for improved trunk rotation x5 reps each side, pt more limited twisting to R, cued to try to use legs to help with pivoting   PWR! Step for improved step initiation x10 reps each side, step out and step in, cues for incr intensity and amplitude of movement.   Cues provided for level of effort, intensity, and relation to function.  Pt reporting working at a 5/10 level of effort, cued throughout to try to work towards a 7.    Worked on sit <> stands from mat table with focus on proper technique with scooting out towards edge, scooting feet back and incr forward lean. Pt has difficulty standing up upon first attempt, educated on performing with using momentum as a technique and counting to 3 with big forward lean each time and standing on 3, pt took a few attempts to get this, but when he was he was able to stand upon first attempt. Performed approx. 5 reps, will continue to need to practice. Discussed importance of incr effort with standing.   PATIENT EDUCATION: Education details: Importance of using his rollator at home to decr fall risk.   Person educated: Patient and Spouse Education method: Explanation, Demonstration, and Verbal cues Education comprehension: verbalized understanding and needs further education     HOME EXERCISE PROGRAM: Access Code: [email or web address] URL: [email or web address] Date:  09/10/2021 Prepared by: Estevan Ryder  Exercises - Sit to Stand with Counter Support  - 1 x daily - 7 x weekly - 3 sets - 10 reps - Seated Heel Raise  - 1 x daily - 7 x weekly - 3 sets - 10 reps - Seated Toe Raise  - 1 x daily - 7 x weekly - 3 sets - 10 reps - Seated Scapular Retraction  -  1 x daily - 7 x weekly - 3 sets - 10 reps - Seated March  - 1 x daily - 7 x weekly - 3 sets - 10 reps - Seated Long Arc Quad  - 1 x daily - 7 x weekly - 3 sets - 10 reps     GOALS: Goals reviewed with patient? Yes   SHORT TERM GOALS: Target date: 10/02/2021   Pt will perform initial HEP w/min A from wife for improved strength, balance, transfers and gait.   Baseline: not established on eval Goal status: INITIAL   2.  Pt will improve gait velocity to at least 1.4 ft/s w/LRAD and S* for improved gait efficiency and safety   Baseline: 1.19 ft/s w/SPC and CGA Goal status: INITIAL   3.  TUG to be assessed and STG/LTG written  Baseline: 45.56s with CGA Goal status: MET   4.  Pt will improve 5 x STS to less than or equal to 48 seconds w/BUE support to demonstrate improved functional strength and transfer efficiency.    Baseline: 55.06s w/BUE support  Goal status: INITIAL   5.  Pt will ambulate 150' or greater w/LRAD and S* for improved safety with functional mobility  Baseline: ambulating w/SPC and CGA Goal status: INITIAL  6. Pt will improve TUG to </= 37s secs to demonstrated reduced fall risk  Baseline: 45.56s   Goal Status: INITIAL       LONG TERM GOALS: Target date: 10/30/2021   Pt will perform final HEP w/min A from wife for improved strength, balance, transfers and gait. Baseline:  Goal status: INITIAL   2.  Pt will improve TUG to </= 28 secs to demonstrated reduced fall risk  Baseline: 45.56s Goal status: INITIAL   3.  Pt will improve 5 x STS to less than or equal to 41 seconds w/BUE support to demonstrate improved functional strength and transfer efficiency.    Baseline:  55.06s w/BUE support  Goal status: INITIAL   4.  Pt and wife will verbalize and demonstrate fall prevention strategies in the home for reduced fall risk and improved safety  Baseline:  Goal status: INITIAL   5.  Pt will improve gait velocity to at least 1.9 ft/s w/LRAD and S* for improved gait efficiency and reduced fall risk   Baseline: 1.19 ft/s w/SPC and CGA Goal status: INITIAL   6.  Pt will ambulate 300' or greater w/LRAD and S* for improved global endurance and functional mobility independence  Baseline: ambulating w/SPC and CGA Goal status: INITIAL   ASSESSMENT:   CLINICAL IMPRESSION: Tried seated PWR moves today for the first time to work on larger amplitude movement patterns and ROM. Pt needs cues to work at an effort level of 7/10. Pt challenged by doing seated PWR Twist, has less ROM going to the R. Worked on sit <> stands with use of counting to 3 and momentum as a technique. Pt able to stand upon first attempt, but will need continued practice. Will continue to progress towards LTGs.     OBJECTIVE IMPAIRMENTS Abnormal gait, decreased activity tolerance, decreased balance, decreased cognition, decreased coordination, decreased endurance, decreased knowledge of condition, decreased knowledge of use of DME, decreased mobility, difficulty walking, decreased strength, decreased safety awareness, and pain.    ACTIVITY LIMITATIONS carrying, lifting, bending, standing, squatting, sleeping, stairs, transfers, bed mobility, reach over head, and locomotion level   PARTICIPATION LIMITATIONS: meal prep, cleaning, laundry, medication management, personal finances, interpersonal relationship, driving, shopping, community activity, and yard work  PERSONAL FACTORS Age, Behavior pattern, Fitness, Past/current experiences, Time since onset of injury/illness/exacerbation, Transportation, and 1 comorbidity: Dementia  are also affecting patient's functional outcome.    REHAB POTENTIAL: Fair due  to poor prognosis associated w/Lewy Body Dementia    CLINICAL DECISION MAKING: Evolving/moderate complexity   EVALUATION COMPLEXITY: Moderate   PLAN: PT FREQUENCY: 2x/week   PT DURATION: 8 weeks   PLANNED INTERVENTIONS: Therapeutic exercises, Therapeutic activity, Neuromuscular re-education, Balance training, Gait training, Patient/Family education, Self Care, Joint mobilization, Stair training, DME instructions, Manual therapy, and Re-evaluation   PLAN FOR NEXT SESSION: Obstacle course w/rollator, eccentric heel taps, sit <>Stands, step length, scifit for large amplitude coordination, turns, lateral stepping, seated Pwr! Moves to HEP.   Janann August, PT, DPT 09/19/21 9:31 AM

## 2021-09-24 ENCOUNTER — Ambulatory Visit: Payer: Medicare HMO | Attending: Neurology | Admitting: Physical Therapy

## 2021-09-24 ENCOUNTER — Encounter: Payer: Self-pay | Admitting: Physical Therapy

## 2021-09-24 VITALS — BP 149/72 | HR 50

## 2021-09-24 DIAGNOSIS — R2689 Other abnormalities of gait and mobility: Secondary | ICD-10-CM | POA: Diagnosis not present

## 2021-09-24 DIAGNOSIS — M6281 Muscle weakness (generalized): Secondary | ICD-10-CM | POA: Insufficient documentation

## 2021-09-24 DIAGNOSIS — R2681 Unsteadiness on feet: Secondary | ICD-10-CM | POA: Insufficient documentation

## 2021-09-24 NOTE — Therapy (Signed)
OUTPATIENT PHYSICAL THERAPY TREATMENT NOTE   Patient Name: Joe Murray MRN: [id number] DOB:11-25-1940, 81 y.o., male Today's Date: 09/24/2021  PCP: Alysia Penna, MD REFERRING PROVIDER: Alysia Penna, MD  END OF SESSION:   PT End of Session - 09/24/21 0851     Visit Number 6    Number of Visits 17    Date for PT Re-Evaluation 11/06/21    Authorization Type [employer]    Progress Note Due on Visit 10    PT Start Time 0848    PT Stop Time 0930    PT Time Calculation (min) 42 min    Equipment Utilized During Treatment Gait belt    Activity Tolerance Patient tolerated treatment well;Patient limited by fatigue    Behavior During Therapy [facility] for tasks assessed/performed               Past Medical History:  Diagnosis Date   Alzheimer's dementia [facility])    sees Dr. Ellouise Newer   CAD S/P percutaneous coronary angioplasty 10/'03; 3/'09; 7/*09; 1/'17    a. Dr. Ellyn Hack; '03 - Cypher DES 3.0 mm 33 mm proximal-mid LAD (details D1 with ostial 60%); 3/'09 dRCA 2.75 mm x 13 mm Cypher DES (2.8 mm); 7/'09 pRCA 3.0 mm x 18 m Cypher DES (3.25 mm);; (2009) 2D Echo - EF 55%, (November 2014) nonischemic Myoview;; b. Promus DES to RCA and mid LAD 02/26/2015   Chronotropic incompetence 12/2011   ETT 11/2017: Normal blood pressure response.  No EKG changes.  Exercise stopped due to fatigue and dyspnea.  Heart rate increased to low 90s from 50s.  Suggest chronotropic incompetence.  Severely impaired exercise capacity. -->  Results reviewed with electrophysiology: Still not indication for pacemaker.   Diabetes mellitus type 2 in obese [facility])    CAD   Diverticulosis    Dyslipidemia, goal LDL below 70    on [employer] study medicaion.   ED (erectile dysfunction)    GERD (gastroesophageal reflux disease)    Hemorrhoids 07/2002   Internal and External   Insomnia    Nodular basal cell carcinoma (BCC) 05/08/2019   left ear (cx79f)   Non-STEMI (non-ST elevated myocardial infarction) ([place] 11/2001    a. Proximal LAD tandem lesions -- long DES stent covering both;; b. Jan 2017: mRCA PCI, mLAD PCI   Parkinson's disease ([facility]    sees Dr. KEllouise Newer  Persistent sinus bradycardia    Personal history of colonic adenomas 09/06/2012   Psoriasis    Resting HR in 54s   SCC (squamous cell carcinoma) 11/13/2001   right upper outer arm (txpbx)   SCC (squamous cell carcinoma) 11/13/2009   right upper ear rim (cx3 542f   SCC (squamous cell carcinoma) 01/20/2010   right nose ([id number]  SCC (squamous cell carcinoma) 03/28/2014   right side of nose ( [id number] SCC (squamous cell carcinoma) 06/03/2016   right side of nose   SCC (squamous cell carcinoma) 05/08/2019   right ear inferior ([id number]   SCCA (squamous cell carcinoma) of skin 04/23/2021   Right Temporal Scalp (well diff) (tx p bx)   Spasm of esophagus    Squamous cell carcinoma in situ (SCCIS) 05/08/2019   Right Ear Inferior   Squamous cell carcinoma of skin 11/08/2001   top right rim ear    SVT (supraventricular tachycardia) (HCC) [place]   Vertigo, benign positional    Past Surgical History:  Procedure Laterality Date   CARDIAC CATHETERIZATION N/A 02/26/2015   Procedure: Left  Heart Cath and Coronary Angiography;  Surgeon: [name] [name], MD: mRCA 99% --> PCI. mLAD 75%-> FFR Guided PCI, Mod ISR in pLAD & pRCA   CARDIAC CATHETERIZATION N/A 02/26/2015   Procedure: Coronary Stent Intervention;  Surgeon: [name] [name], MD;  Location: [facility] INVASIVE CV LAB: mRCA Promus Premier DES 3.0 x 12 (3.5), mLAD Promus Premier DES 2.75 x 20 (3.0)   CARDIAC CATHETERIZATION N/A 02/26/2015   Procedure: Intravascular Pressure Wire/FFR Study;  Surgeon: [name] [name], MD;  Location: [facility] INVASIVE CV LAB;  Service: Cardiovascular: LAD 75% - FFR + -> PCI   CATARACT EXTRACTION  2004,2013   COLONOSCOPY  12/16/2017   per Dr. Gessner, adenomatous polyps, no repeats due to age    CORONARY ANGIOPLASTY WITH STENT PLACEMENT  LAD - 2003, RCA 3 & 7/ '09   Cypher  3.0 x 32 mid LAD; Cyper 2.75 x 13 - distal RCA, 3.0 x 18 Prox RCA   ESOPHAGOGASTRODUODENOSCOPY  08-11-02   esophageal dilation per Dr. Gessner   HERNIA REPAIR     right inguinal    NM MYOVIEW LTD  Nov 2014   ~8 METS; EF 60%, no ischemia or infarction   PERCUTANEOUS CORONARY STENT INTERVENTION (PCI-S)  11/23/2001   NSTEMI: Prox-Mid LAD tandem ~80% lesions on either side of D1 (with 80% lesion) -- Cypher DES 3.0 mm x 33 m (covering both lesions)    PERCUTANEOUS CORONARY STENT INTERVENTION (PCI-S)  04/27/2007   Unstable Angina: Distal RCA 95%: Cypher 2.75x13  (2.8 mm); residual focal ~60%ISR in LAD stent, 60% D1 ostial (no PTCA on LAD due to no ischemia on ST)   PERCUTANEOUS CORONARY STENT INTERVENTION (PCI-S)  09/21/2007   Bradycardia & Unstable Angina: Prox RCA 70% - PCI Cypher DES 3.0 mm x 18 mm  (3.25 mm); ostial 60-70% jailed D1. LAD & distal RCA stents patent   SHOULDER SURGERY     left rotator cuff, Dr. Gioffre   TRANSTHORACIC ECHOCARDIOGRAM  March 2009   Normal LV size and function, EF 55%. Mild MR; mild RV dilation.   TRANSTHORACIC ECHOCARDIOGRAM  02/2015   EF 60-65%. Moderate concentric LVH. Normal function with normal regional wall motion. GR 1 DD   WRIST FRACTURE SURGERY     right   Patient Active Problem List   Diagnosis Date Noted   Myalgia due to statin 01/27/2021   Parkinson's disease (HCC) 11/07/2020   Alzheimer's dementia (HCC) 11/07/2020   Intermittent claudication (HCC) 12/01/2017   Trochanteric bursitis of right hip 09/04/2017   Fuchs' corneal dystrophy 08/14/2016   Essential hypertension -> allowing permissive hypertension.  Medication stopped because of fatigue and orthostatic dizziness. 03/05/2015   NSTEMI (non-ST elevated myocardial infarction) (HCC)    Coronary artery disease involving native coronary artery of native heart without angina pectoris    Vitamin D deficiency 07/04/2014   Fatigue 01/08/2014   Obesity (BMI 30-39.9) 01/12/2013   DM (diabetes mellitus),  type 2 with complications - CAD    Dyslipidemia, goal LDL below 70    Exertional shortness of breath 01/12/2012   Chronotropic incompetence 12/25/2011   Vertigo with mild postural lightheadedness. 08/13/2009   ERECTILE DYSFUNCTION, ORGANIC 05/01/2009   GERD 04/18/2008   BPH with urinary obstruction 07/27/2007   CAD S/P percutaneous coronary angioplasty - LADx2, & RCA x 3 07/12/2001    Class: History of    REFERRING DIAG: G30.1,F02.818 (ICD-10-CM) - Alzheimer's dementia, late onset, with behavioral disturbance (HCC) G20 (ICD-10-CM) - Parkinsonism, unspecified Parkinsonism type (HCC)     THERAPY DIAG:  Unsteadiness on feet  Muscle weakness (generalized)  Other abnormalities of gait and mobility  Rationale for Evaluation and Treatment Rehabilitation  PERTINENT HISTORY: hypertension, hyperlipidemia, CAD, diabetes. Neuropsychological evaluation in 01/2020 indicated memory storage type problems, executive dysfunction, diminished processing efficiency, likely due to Alzheimer's disease. His parkinsonism suggests there may be a Lewy Body spectrum component.   PRECAUTIONS: fall  SUBJECTIVE: Did not bring rollator into session today, brought in his SPC. Did 30 sit <> stands last night from his HEP and was extremely wiped out afterwards.   PAIN:  Are you having pain? No  Vitals:   09/24/21 0923  BP: (!) 149/72  Pulse: (!) 50   After standing activity   TODAY'S TREATMENT:   Therapeutic Activity  Reviwed HEP as pt had questions about it as he has been doing 3 sets of 10 sit <> stands and couldn't move yesterday after doing 30 sit <> stands. Revised and changed to 1 set of 10 2 times a day and then can gradually incr to 2 sets of 10 when pt builds up his endurance. Reviewed remainder of exercises and discussed dropping down to performing 2 sets of 10 of each exercises vs 3 sets of 10 for improved tolerance and improved technique. Pt and pt's spouse verbalized understanding.  Educated  importance and purpose of using a rollator at all times in order to help with gait mechanics and balance and to decr fall risk. Pt ambulated in with SPC with incr forward flexed posture and decr stride length bilat.  Educated on importance of drinking water and eating breakfast before PT session as pt more fatigued and lightheaded/dizzy with standing tasks.   Gait Training Gait pattern: step to pattern, step through pattern, decreased arm swing- Right, decreased arm swing- Left, decreased step length- Left, decreased stride length, decreased hip/knee flexion- Right, decreased hip/knee flexion- Left, decreased ankle dorsiflexion- Right, decreased ankle dorsiflexion- Left, Right foot flat, Left foot flat, shuffling, trunk flexed, poor foot clearance- Right, and poor foot clearance- Left Distance walked: Into out of clinic, plus additional distances in clinic.  Assistive device utilized: Walker - 4 wheeled during session, pt brought in his SPC from home.  Level of assistance: SBA, CGA Comments: CGA with SPC in and out of session with pt demonstrating incr forward flexed posture and decr step length bilat/decr hill strike. Utilized rollator during session - cued for foot clearance, stride length and heel strike with rollator.   NMR: Sit to stands from mat table with proper technique - cued to scoot out towards edge, wide BOS, and incr forward lean to stand, performed x5 reps and additional reps during session.   At countertop: -Lateral side stepping, down and back x4 reps, pt initially taking smaller steps and taking 18-20 steps at a time, cued for larger step length laterally with pt able to perform in 10-12 steps. Cues throughout for posture and hip clearance.  -Wide BOS with lateral/superior reaching towards sticky notes, performed x2 reps each side with pt reporting toe pain (pt states this is a chronic issue) and needing to have a seated rest break. Modified and instead had pt just reaching straight  up, which pt able to tolerate this much better, x8 reps each side.  -Wide BOS trunk rotations reaching across body to grab cone and then lateral reaching to give it to PT tech, performed x4 reps each side    PATIENT EDUCATION: Education details: See therapeutic activity section.   Person educated: Patient and   Spouse Education method: Explanation, Demonstration, and Verbal cues Education comprehension: verbalized understanding and needs further education     HOME EXERCISE PROGRAM: Access Code: [email or web address] URL: [email or web address] Date: 09/10/2021 Prepared by: Jennifer Novak  Exercises - Sit to Stand with Counter Support  - 1-2 x daily - 7 x weekly - 1 sets - 10 reps - Seated Heel Raise  - 1 x daily - 7 x weekly - 3 sets - 10 reps - Seated Toe Raise  - 1 x daily - 7 x weekly - 3 sets - 10 reps - Seated Scapular Retraction  - 1 x daily - 7 x weekly - 3 sets - 10 reps - Seated March  - 1 x daily - 7 x weekly - 3 sets - 10 reps - Seated Long Arc Quad  - 1 x daily - 7 x weekly - 3 sets - 10 reps     GOALS: Goals reviewed with patient? Yes   SHORT TERM GOALS: Target date: 10/02/2021   Pt will perform initial HEP w/min A from wife for improved strength, balance, transfers and gait.   Baseline: not established on eval Goal status: INITIAL   2.  Pt will improve gait velocity to at least 1.4 ft/s w/LRAD and S* for improved gait efficiency and safety   Baseline: 1.19 ft/s w/SPC and CGA Goal status: INITIAL   3.  TUG to be assessed and STG/LTG written  Baseline: 45.56s with CGA Goal status: MET   4.  Pt will improve 5 x STS to less than or equal to 48 seconds w/BUE support to demonstrate improved functional strength and transfer efficiency.    Baseline: 55.06s w/BUE support  Goal status: INITIAL   5.  Pt will ambulate 150' or greater w/LRAD and S* for improved safety with functional mobility  Baseline: ambulating w/SPC and CGA Goal status: INITIAL  6. Pt will  improve TUG to </= 37s secs to demonstrated reduced fall risk  Baseline: 45.56s   Goal Status: INITIAL       LONG TERM GOALS: Target date: 10/30/2021   Pt will perform final HEP w/min A from wife for improved strength, balance, transfers and gait. Baseline:  Goal status: INITIAL   2.  Pt will improve TUG to </= 28 secs to demonstrated reduced fall risk  Baseline: 45.56s Goal status: INITIAL   3.  Pt will improve 5 x STS to less than or equal to 41 seconds w/BUE support to demonstrate improved functional strength and transfer efficiency.    Baseline: 55.06s w/BUE support  Goal status: INITIAL   4.  Pt and wife will verbalize and demonstrate fall prevention strategies in the home for reduced fall risk and improved safety  Baseline:  Goal status: INITIAL   5.  Pt will improve gait velocity to at least 1.9 ft/s w/LRAD and S* for improved gait efficiency and reduced fall risk   Baseline: 1.19 ft/s w/SPC and CGA Goal status: INITIAL   6.  Pt will ambulate 300' or greater w/LRAD and S* for improved global endurance and functional mobility independence  Baseline: ambulating w/SPC and CGA Goal status: INITIAL   ASSESSMENT:   CLINICAL IMPRESSION: Focused on verbally reviewing HEP, sit <> stands, and standing tolerance/balance. During standing activity, pt needing to have a seated rest break due to feeling lightheaded/dizzy. Assessed pt's BP and was WNL (see above). Pt has not been drinking enough water and did not eat today before coming to therapy. Educated on importance of making sure pt   is staying hydrated. Will continue to progress towards LTGs.     OBJECTIVE IMPAIRMENTS Abnormal gait, decreased activity tolerance, decreased balance, decreased cognition, decreased coordination, decreased endurance, decreased knowledge of condition, decreased knowledge of use of DME, decreased mobility, difficulty walking, decreased strength, decreased safety awareness, and pain.    ACTIVITY  LIMITATIONS carrying, lifting, bending, standing, squatting, sleeping, stairs, transfers, bed mobility, reach over head, and locomotion level   PARTICIPATION LIMITATIONS: meal prep, cleaning, laundry, medication management, personal finances, interpersonal relationship, driving, shopping, community activity, and yard work   PERSONAL FACTORS Age, Behavior pattern, Fitness, Past/current experiences, Time since onset of injury/illness/exacerbation, Transportation, and 1 comorbidity: Dementia  are also affecting patient's functional outcome.    REHAB POTENTIAL: Fair due to poor prognosis associated w/Lewy Body Dementia    CLINICAL DECISION MAKING: Evolving/moderate complexity   EVALUATION COMPLEXITY: Moderate   PLAN: PT FREQUENCY: 2x/week   PT DURATION: 8 weeks   PLANNED INTERVENTIONS: Therapeutic exercises, Therapeutic activity, Neuromuscular re-education, Balance training, Gait training, Patient/Family education, Self Care, Joint mobilization, Stair training, DME instructions, Manual therapy, and Re-evaluation   PLAN FOR NEXT SESSION: Obstacle course w/rollator, eccentric heel taps, sit <>Stands, step length, scifit for large amplitude coordination, turns, lateral stepping. Exercises for posture.   Janann August, PT, DPT 09/24/21 12:28 PM

## 2021-09-26 ENCOUNTER — Ambulatory Visit: Payer: Medicare HMO | Admitting: Physical Therapy

## 2021-09-26 DIAGNOSIS — R2681 Unsteadiness on feet: Secondary | ICD-10-CM

## 2021-09-26 DIAGNOSIS — R2689 Other abnormalities of gait and mobility: Secondary | ICD-10-CM | POA: Diagnosis not present

## 2021-09-26 DIAGNOSIS — M6281 Muscle weakness (generalized): Secondary | ICD-10-CM

## 2021-09-26 NOTE — Therapy (Signed)
OUTPATIENT PHYSICAL THERAPY TREATMENT NOTE   Patient Name: Joe Murray MRN: [id number] DOB:[birth year], 81 y.o., male Today's Date: 09/26/2021  PCP: Alysia Penna, MD REFERRING PROVIDER: Alysia Penna, MD  END OF SESSION:   PT End of Session - 09/26/21 0850     Visit Number 7    Number of Visits 17    Date for PT Re-Evaluation 11/06/21    Authorization Type [employer]    Progress Note Due on Visit 10    PT Start Time 0846    PT Stop Time 0927    PT Time Calculation (min) 41 min    Equipment Utilized During Treatment Gait belt    Activity Tolerance Patient tolerated treatment well;Patient limited by fatigue    Behavior During Therapy [facility] for tasks assessed/performed                Past Medical History:  Diagnosis Date   Alzheimer's dementia [facility])    sees Dr. Ellouise Newer   CAD S/P percutaneous coronary angioplasty 10/'03; 3/'09; 7/*09; 1/'17    a. Dr. Ellyn Hack; '03 - Cypher DES 3.0 mm 33 mm proximal-mid LAD (details D1 with ostial 60%); 3/'09 dRCA 2.75 mm x 13 mm Cypher DES (2.8 mm); 7/'09 pRCA 3.0 mm x 18 m Cypher DES (3.25 mm);; (2009) 2D Echo - EF 55%, (November 2014) nonischemic Myoview;; b. Promus DES to RCA and mid LAD 02/26/2015   Chronotropic incompetence 12/2011   ETT 11/2017: Normal blood pressure response.  No EKG changes.  Exercise stopped due to fatigue and dyspnea.  Heart rate increased to low 90s from 50s.  Suggest chronotropic incompetence.  Severely impaired exercise capacity. -->  Results reviewed with electrophysiology: Still not indication for pacemaker.   Diabetes mellitus type 2 in obese [facility])    CAD   Diverticulosis    Dyslipidemia, goal LDL below 70    on [employer] study medicaion.   ED (erectile dysfunction)    GERD (gastroesophageal reflux disease)    Hemorrhoids 07/2002   Internal and External   Insomnia    Nodular basal cell carcinoma (BCC) 05/08/2019   left ear (cx30f)   Non-STEMI (non-ST elevated myocardial infarction) [place]  11/2001   a. Proximal LAD tandem lesions -- long DES stent covering both;; b. Jan 2017: mRCA PCI, mLAD PCI   Parkinson's disease ([facility]    sees Dr. KEllouise Newer  Persistent sinus bradycardia    Personal history of colonic adenomas 09/06/2012   Psoriasis    Resting HR in 54s   SCC (squamous cell carcinoma) 11/13/2001   right upper outer arm (txpbx)   SCC (squamous cell carcinoma) 11/13/2009   right upper ear rim (cx3 [id number]   SCC (squamous cell carcinoma) 01/20/2010   right nose ([id number]  SCC (squamous cell carcinoma) 03/28/2014   right side of nose ( [id number] SCC (squamous cell carcinoma) 06/03/2016   right side of nose   SCC (squamous cell carcinoma) 05/08/2019   right ear inferior ([id number]   SCCA (squamous cell carcinoma) of skin 04/23/2021   Right Temporal Scalp (well diff) (tx p bx)   Spasm of esophagus    Squamous cell carcinoma in situ (SCCIS) 05/08/2019   Right Ear Inferior   Squamous cell carcinoma of skin 11/08/2001   top right rim ear    SVT (supraventricular tachycardia) (HCC) [place]   Vertigo, benign positional    Past Surgical History:  Procedure Laterality Date   CARDIAC CATHETERIZATION N/A 02/26/2015   Procedure:  Left Heart Cath and Coronary Angiography;  Surgeon: Leonie Man, MD: mRCA 99% --> PCI. mLAD 75%-> FFR Guided PCI, Mod ISR in pLAD & pRCA   CARDIAC CATHETERIZATION N/A 02/26/2015   Procedure: Coronary Stent Intervention;  Surgeon: Leonie Man, MD;  Location: [facility] INVASIVE CV LAB: mRCA Promus Premier DES 3.0 x 12 (3.5), mLAD Promus Premier DES 2.75 x 20 (3.0)   CARDIAC CATHETERIZATION N/A 02/26/2015   Procedure: Intravascular Pressure Wire/FFR Study;  Surgeon: Leonie Man, MD;  Location: [place] CV LAB;  Service: Cardiovascular: LAD 75% - FFR + -> PCI   CATARACT EXTRACTION  [id number]   COLONOSCOPY  12/16/2017   per Dr. Carlean Purl, adenomatous polyps, no repeats due to age    [place]  LAD - 2003, RCA 3 & 7/ '09    Cypher 3.0 x 32 mid LAD; Cyper 2.75 x 13 - distal RCA, 3.0 x 18 Prox RCA   ESOPHAGOGASTRODUODENOSCOPY  08-11-02   esophageal dilation per Dr. Carlean Purl   HERNIA REPAIR     right inguinal    NM MYOVIEW LTD  Nov 2014   ~8 METS; EF 60%, no ischemia or infarction   PERCUTANEOUS CORONARY STENT INTERVENTION (PCI-S)  11/23/2001   NSTEMI: Prox-Mid LAD tandem ~80% lesions on either side of D1 (with 80% lesion) -- Cypher DES 3.0 mm x 33 m (covering both lesions)    PERCUTANEOUS CORONARY STENT INTERVENTION (PCI-S)  04/27/2007   Unstable Angina: Distal RCA 95%: Cypher 2.75x13  (2.8 mm); residual focal ~60%ISR in LAD stent, 60% D1 ostial (no PTCA on LAD due to no ischemia on ST)   PERCUTANEOUS CORONARY STENT INTERVENTION (PCI-S)  09/21/2007   Bradycardia & Unstable Angina: Prox RCA 70% - PCI Cypher DES 3.0 mm x 18 mm  (3.25 mm); ostial 60-70% jailed D1. LAD & distal RCA stents patent   SHOULDER SURGERY     left rotator cuff, Dr. Gladstone Lighter   TRANSTHORACIC ECHOCARDIOGRAM  March 2009   Normal LV size and function, EF 55%. Mild MR; mild RV dilation.   TRANSTHORACIC ECHOCARDIOGRAM  02/2015   EF 60-65%. Moderate concentric LVH. Normal function with normal regional wall motion. GR 1 DD   WRIST FRACTURE SURGERY     right   Patient Active Problem List   Diagnosis Date Noted   Myalgia due to statin 01/27/2021   Parkinson's disease ([place]) 11/07/2020   Alzheimer's dementia ([place]) 11/07/2020   Intermittent claudication ([place]) 12/01/2017   Trochanteric bursitis of right hip 09/04/2017   Fuchs' corneal dystrophy 08/14/2016   Essential hypertension -> allowing permissive hypertension.  Medication stopped because of fatigue and orthostatic dizziness. 03/05/2015   NSTEMI (non-ST elevated myocardial infarction) [facility])    Coronary artery disease involving native coronary artery of native heart without angina pectoris    Vitamin D deficiency 07/04/2014   Fatigue 01/08/2014   Obesity (BMI 30-39.9) 01/12/2013   DM (diabetes  mellitus), type 2 with complications - CAD    Dyslipidemia, goal LDL below 70    Exertional shortness of breath 01/12/2012   Chronotropic incompetence 12/25/2011   Vertigo with mild postural lightheadedness. 08/13/2009   ERECTILE DYSFUNCTION, ORGANIC 05/01/2009   GERD 04/18/2008   BPH with urinary obstruction 07/27/2007   CAD S/P percutaneous coronary angioplasty - LADx2, & RCA x 3 07/12/2001    Class: History of    REFERRING DIAG: G30.1,F02.818 (ICD-10-CM) - Alzheimer's dementia, late onset, with behavioral disturbance ([place]) G20 (ICD-10-CM) - Parkinsonism, unspecified Parkinsonism type ([place])  THERAPY DIAG:  Unsteadiness on feet  Muscle weakness (generalized)  Other abnormalities of gait and mobility  Rationale for Evaluation and Treatment Rehabilitation  PERTINENT HISTORY: hypertension, hyperlipidemia, CAD, diabetes. Neuropsychological evaluation in 01/2020 indicated memory storage type problems, executive dysfunction, diminished processing efficiency, likely due to Alzheimer's disease. His parkinsonism suggests there may be a Lewy Body spectrum component.   PRECAUTIONS: fall  SUBJECTIVE: Pt reports his L foot is sore, has been for ~6 months. Did 30 sit <> stands again last night from his HEP and was extremely wiped out afterwards despite therapist reducing #of reps last session. No new falls   PAIN:  Are you having pain? Yes: NPRS scale: 4/10 Pain location: L big toe Pain description: Achy  There were no vitals filed for this visit.   TODAY'S TREATMENT:  Therapeutic Exercise  SciFit multi-peaks level 5 for 8 minutes using BUE/BLEs for neural priming for reciprocal movement, dynamic cardiovascular warmup and increased amplitude of stepping. Min cues to maintain steps/min >65 and for increased knee extension of LLE. RPE of 10/10 following activity. "Did I win?"  NMR  At counter for improved step length/clearance, lateral weight shifting and single leg stability: lateral  single 4" hurdle step overs using colored dots on floor for visual targets for step length w/BUE support on counter. Pt required max verbal cues initially for proper sequencing and increased step length, but progressed to mod cues after some practice. Min cues for increased hip flexion, as pt demonstrating circumduction around hurdle initially and frequently attempted to step to contralateral dot and hitting hurdle due to forgetting to take large step. CGA throughout for safety.   Practiced weaving through 5 cones w/rollator for turns practice and safe AD management, down and back x2. Pt required min cues to maintain safe distance to rollator throughout. Noted scuffing of RLE which corrected w/improved management of AD. Pt rated fatigue as 8/10 following activity.   Gait Training Gait pattern: step to pattern, step through pattern, decreased arm swing- Right, decreased arm swing- Left, decreased step length- Left, decreased stride length, decreased hip/knee flexion- Right, decreased hip/knee flexion- Left, decreased ankle dorsiflexion- Right, decreased ankle dorsiflexion- Left, Right foot flat, Left foot flat, shuffling, trunk flexed, poor foot clearance- Right, and poor foot clearance- Left Distance walked: Into out of clinic, plus additional distances in clinic.  Assistive device utilized: Environmental consultant - 4 wheeled  Level of assistance: SBA Comments: Cued for safe AD management and increased step length throughout session    PATIENT EDUCATION: Education details: Reviewed HEP sets and reps from last session, continued to reiterate pushing RPE to 5-6/10 when doing HEP    Person educated: Patient and Spouse Education method: Explanation, Demonstration, and Verbal cues Education comprehension: verbalized understanding and needs further education     HOME EXERCISE PROGRAM: Access Code: [email or web address] URL: [email or web address] Date: 09/10/2021 Prepared by: Estevan Ryder  Exercises - Sit to  Stand with Counter Support  - 1-2 x daily - 7 x weekly - 1 sets - 10 reps - Seated Heel Raise  - 1 x daily - 7 x weekly - 3 sets - 10 reps - Seated Toe Raise  - 1 x daily - 7 x weekly - 3 sets - 10 reps - Seated Scapular Retraction  - 1 x daily - 7 x weekly - 3 sets - 10 reps - Seated March  - 1 x daily - 7 x weekly - 3 sets - 10 reps - Seated Long Arc Quad  - 1 x  daily - 7 x weekly - 3 sets - 10 reps     GOALS: Goals reviewed with patient? Yes   SHORT TERM GOALS: Target date: 10/02/2021   Pt will perform initial HEP w/min A from wife for improved strength, balance, transfers and gait.   Baseline: not established on eval Goal status: INITIAL   2.  Pt will improve gait velocity to at least 1.4 ft/s w/LRAD and S* for improved gait efficiency and safety   Baseline: 1.19 ft/s w/SPC and CGA Goal status: INITIAL   3.  TUG to be assessed and STG/LTG written  Baseline: 45.56s with CGA Goal status: MET   4.  Pt will improve 5 x STS to less than or equal to 48 seconds w/BUE support to demonstrate improved functional strength and transfer efficiency.    Baseline: 55.06s w/BUE support  Goal status: INITIAL   5.  Pt will ambulate 150' or greater w/LRAD and S* for improved safety with functional mobility  Baseline: ambulating w/SPC and CGA Goal status: INITIAL  6. Pt will improve TUG to </= 37s secs to demonstrated reduced fall risk  Baseline: 45.56s   Goal Status: INITIAL       LONG TERM GOALS: Target date: 10/30/2021   Pt will perform final HEP w/min A from wife for improved strength, balance, transfers and gait. Baseline:  Goal status: INITIAL   2.  Pt will improve TUG to </= 28 secs to demonstrated reduced fall risk  Baseline: 45.56s Goal status: INITIAL   3.  Pt will improve 5 x STS to less than or equal to 41 seconds w/BUE support to demonstrate improved functional strength and transfer efficiency.    Baseline: 55.06s w/BUE support  Goal status: INITIAL   4.  Pt and  wife will verbalize and demonstrate fall prevention strategies in the home for reduced fall risk and improved safety  Baseline:  Goal status: INITIAL   5.  Pt will improve gait velocity to at least 1.9 ft/s w/LRAD and S* for improved gait efficiency and reduced fall risk   Baseline: 1.19 ft/s w/SPC and CGA Goal status: INITIAL   6.  Pt will ambulate 300' or greater w/LRAD and S* for improved global endurance and functional mobility independence  Baseline: ambulating w/SPC and CGA Goal status: INITIAL   ASSESSMENT:   CLINICAL IMPRESSION: Emphasis of skilled PT session on endurance, increased step length and AD management. Pt very fatigued today, reports he performed 30 sit <>stands again last night despite having repetitions reduced last session. Continued to educate pt on pushing himself to 5-6/10 RPE to avoid over doing exercises and compensations. Pt verbalized understanding. Pt requires mod-max multimodal cues for proper sequencing and performance of interventions initially but is able to perform well w/practice. Continue POC.     OBJECTIVE IMPAIRMENTS Abnormal gait, decreased activity tolerance, decreased balance, decreased cognition, decreased coordination, decreased endurance, decreased knowledge of condition, decreased knowledge of use of DME, decreased mobility, difficulty walking, decreased strength, decreased safety awareness, and pain.    ACTIVITY LIMITATIONS carrying, lifting, bending, standing, squatting, sleeping, stairs, transfers, bed mobility, reach over head, and locomotion level   PARTICIPATION LIMITATIONS: meal prep, cleaning, laundry, medication management, personal finances, interpersonal relationship, driving, shopping, community activity, and yard work   PERSONAL FACTORS Age, Behavior pattern, Fitness, Past/current experiences, Time since onset of injury/illness/exacerbation, Transportation, and 1 comorbidity: Dementia  are also affecting patient's functional outcome.     REHAB POTENTIAL: Fair due to poor prognosis associated w/Lewy Body Dementia  CLINICAL DECISION MAKING: Evolving/moderate complexity   EVALUATION COMPLEXITY: Moderate   PLAN: PT FREQUENCY: 2x/week   PT DURATION: 8 weeks   PLANNED INTERVENTIONS: Therapeutic exercises, Therapeutic activity, Neuromuscular re-education, Balance training, Gait training, Patient/Family education, Self Care, Joint mobilization, Stair training, DME instructions, Manual therapy, and Re-evaluation   PLAN FOR NEXT SESSION: Goal assessment, Obstacle course w/rollator, eccentric heel taps, sit <>Stands, step length, scifit for large amplitude coordination, turns, lateral stepping. Exercises for posture.   Cruzita Lederer Erasto Sleight, PT, DPT 09/26/21 9:30 AM

## 2021-10-01 ENCOUNTER — Ambulatory Visit: Payer: Medicare HMO

## 2021-10-01 DIAGNOSIS — M6281 Muscle weakness (generalized): Secondary | ICD-10-CM

## 2021-10-01 DIAGNOSIS — R2681 Unsteadiness on feet: Secondary | ICD-10-CM | POA: Diagnosis not present

## 2021-10-01 DIAGNOSIS — R2689 Other abnormalities of gait and mobility: Secondary | ICD-10-CM

## 2021-10-01 NOTE — Therapy (Signed)
OUTPATIENT PHYSICAL THERAPY TREATMENT NOTE   Patient Name: Joe Murray MRN: [id number] DOB:[birth year], 81 y.o., male Today's Date: 10/01/2021  PCP: Alysia Penna, MD REFERRING PROVIDER: Alysia Penna, MD  END OF SESSION:   PT End of Session - 10/01/21 0837     Visit Number 8    Number of Visits 17    Date for PT Re-Evaluation 11/06/21    Authorization Type [employer]    Progress Note Due on Visit 10    PT Start Time 0845    PT Stop Time 0928    PT Time Calculation (min) 43 min    Equipment Utilized During Treatment Gait belt    Activity Tolerance Patient tolerated treatment well    Behavior During Therapy [facility] for tasks assessed/performed                Past Medical History:  Diagnosis Date   Alzheimer's dementia [facility])    sees Dr. Ellouise Newer   CAD S/P percutaneous coronary angioplasty 10/'03; 3/'09; 7/*09; 1/'17    a. Dr. Ellyn Hack; '03 - Cypher DES 3.0 mm 33 mm proximal-mid LAD (details D1 with ostial 60%); 3/'09 dRCA 2.75 mm x 13 mm Cypher DES (2.8 mm); 7/'09 pRCA 3.0 mm x 18 m Cypher DES (3.25 mm);; (2009) 2D Echo - EF 55%, (November 2014) nonischemic Myoview;; b. Promus DES to RCA and mid LAD 02/26/2015   Chronotropic incompetence 12/2011   ETT 11/2017: Normal blood pressure response.  No EKG changes.  Exercise stopped due to fatigue and dyspnea.  Heart rate increased to low 90s from 50s.  Suggest chronotropic incompetence.  Severely impaired exercise capacity. -->  Results reviewed with electrophysiology: Still not indication for pacemaker.   Diabetes mellitus type 2 in obese [facility])    CAD   Diverticulosis    Dyslipidemia, goal LDL below 70    on [employer] study medicaion.   ED (erectile dysfunction)    GERD (gastroesophageal reflux disease)    Hemorrhoids 07/2002   Internal and External   Insomnia    Nodular basal cell carcinoma (BCC) 05/08/2019   left ear (cx65f)   [place]-STEMI [place]-ST elevated [place]   a. Proximal LAD tandem  lesions -- long DES stent covering both;; b. Jan 2017: mRCA PCI, mLAD PCI   Parkinson's disease ([facility]    sees Dr. KEllouise Newer  Persistent sinus bradycardia    Personal history of colonic adenomas 09/06/2012   Psoriasis    Resting HR in 54s   SCC (squamous cell carcinoma) 11/13/2001   right upper outer arm (txpbx)   SCC (squamous cell carcinoma) 11/13/2009   right upper ear rim (cx3 532f   SCC (squamous cell carcinoma) 01/20/2010   right nose ([id number]  SCC (squamous cell carcinoma) 03/28/2014   right side of nose ( [id number] SCC (squamous cell carcinoma) 06/03/2016   right side of nose   SCC (squamous cell carcinoma) 05/08/2019   right ear inferior ([id number]   SCCA (squamous cell carcinoma) of skin 04/23/2021   Right Temporal Scalp (well diff) (tx p bx)   Spasm of esophagus    Squamous cell carcinoma in situ (SCCIS) 05/08/2019   Right Ear Inferior   Squamous cell carcinoma of skin 11/08/2001   top right rim ear    SVT (supraventricular tachycardia) (HCC) [place]3   Vertigo, benign positional    Past Surgical History:  Procedure Laterality Date   CARDIAC CATHETERIZATION N/A 02/26/2015   Procedure: Left Heart Cath  and Coronary Angiography;  Surgeon: Leonie Man, MD: mRCA 99% --> PCI. mLAD 75%-> FFR Guided PCI, Mod ISR in pLAD & pRCA   CARDIAC CATHETERIZATION N/A 02/26/2015   Procedure: Coronary Stent Intervention;  Surgeon: Leonie Man, MD;  Location: [facility] INVASIVE CV LAB: mRCA Promus Premier DES 3.0 x 12 (3.5), mLAD Promus Premier DES 2.75 x 20 (3.0)   CARDIAC CATHETERIZATION N/A 02/26/2015   Procedure: Intravascular Pressure Wire/FFR Study;  Surgeon: Leonie Man, MD;  Location: [place] CV LAB;  Service: Cardiovascular: LAD 75% - FFR + -> PCI   CATARACT EXTRACTION  [id number]   COLONOSCOPY  12/16/2017   per Dr. Carlean Purl, adenomatous polyps, no repeats due to age    [place]  LAD - 2003, RCA 3 & 7/ '09   Cypher 3.0 x 32 mid LAD; Cyper  2.75 x 13 - distal RCA, 3.0 x 18 Prox RCA   ESOPHAGOGASTRODUODENOSCOPY  08-11-02   esophageal dilation per Dr. Carlean Purl   HERNIA REPAIR     right inguinal    NM MYOVIEW LTD  Nov 2014   ~8 METS; EF 60%, no ischemia or infarction   PERCUTANEOUS CORONARY STENT INTERVENTION (PCI-S)  11/23/2001   NSTEMI: Prox-Mid LAD tandem ~80% lesions on either side of D1 (with 80% lesion) -- Cypher DES 3.0 mm x 33 m (covering both lesions)    PERCUTANEOUS CORONARY STENT INTERVENTION (PCI-S)  04/27/2007   Unstable Angina: Distal RCA 95%: Cypher 2.75x13  (2.8 mm); residual focal ~60%ISR in LAD stent, 60% D1 ostial (no PTCA on LAD due to no ischemia on ST)   PERCUTANEOUS CORONARY STENT INTERVENTION (PCI-S)  09/21/2007   Bradycardia & Unstable Angina: Prox RCA 70% - PCI Cypher DES 3.0 mm x 18 mm  (3.25 mm); ostial 60-70% jailed D1. LAD & distal RCA stents patent   SHOULDER SURGERY     left rotator cuff, Dr. Gladstone Lighter   TRANSTHORACIC ECHOCARDIOGRAM  March 2009   Normal LV size and function, EF 55%. Mild MR; mild RV dilation.   TRANSTHORACIC ECHOCARDIOGRAM  02/2015   EF 60-65%. Moderate concentric LVH. Normal function with normal regional wall motion. GR 1 DD   WRIST FRACTURE SURGERY     right   Patient Active Problem List   Diagnosis Date Noted   Myalgia due to statin 01/27/2021   Parkinson's disease ([place]) 11/07/2020   Alzheimer's dementia ([place]) 11/07/2020   Intermittent claudication ([place]) 12/01/2017   Trochanteric bursitis of right hip 09/04/2017   Fuchs' corneal dystrophy 08/14/2016   Essential hypertension -> allowing permissive hypertension.  Medication stopped because of fatigue and orthostatic dizziness. 03/05/2015   NSTEMI (non-ST elevated myocardial infarction) [facility])    Coronary artery disease involving native coronary artery of native heart without angina pectoris    Vitamin D deficiency 07/04/2014   Fatigue 01/08/2014   Obesity (BMI 30-39.9) 01/12/2013   DM (diabetes mellitus), type 2 with  complications - CAD    Dyslipidemia, goal LDL below 70    Exertional shortness of breath 01/12/2012   Chronotropic incompetence 12/25/2011   Vertigo with mild postural lightheadedness. 08/13/2009   ERECTILE DYSFUNCTION, ORGANIC 05/01/2009   GERD 04/18/2008   BPH with urinary obstruction 07/27/2007   CAD S/P percutaneous coronary angioplasty - LADx2, & RCA x 3 07/12/2001    Class: History of    REFERRING DIAG: G30.1,F02.818 (ICD-10-CM) - Alzheimer's dementia, late onset, with behavioral disturbance ([place]) G20 (ICD-10-CM) - Parkinsonism, unspecified Parkinsonism type ([place])   THERAPY DIAG:  Unsteadiness on feet  Muscle weakness (generalized)  Other abnormalities of gait and mobility  Rationale for Evaluation and Treatment Rehabilitation  PERTINENT HISTORY: hypertension, hyperlipidemia, CAD, diabetes. Neuropsychological evaluation in 01/2020 indicated memory storage type problems, executive dysfunction, diminished processing efficiency, likely due to Alzheimer's disease. His parkinsonism suggests there may be a Lewy Body spectrum component.   PRECAUTIONS: fall  SUBJECTIVE: Patient reports doing well. No falls, but multiple near falls/day. He reports that when he turns quickly that's when he's more likely to lose his balance.   PAIN:  Are you having pain? Yes: NPRS scale: 7/10 Pain location: L big toe Pain description: Achy *reports that when MD trims toe nails, that helps the pain  There were no vitals filed for this visit.   TODAY'S TREATMENT:  Theract:   [facility] PT Assessment - 10/01/21 0001       Standardized Balance Assessment   Standardized Balance Assessment Five Times Sit to Stand    Five times sit to stand comments  37.41s with B UE    10 Meter Walk .71m/s      Timed Up and Go Test   Normal TUG (seconds) 37            NMR: -NuStep level 4 x10 mins B UE/LE  -lateral reach outside BOS to target -> trunk rotation  -fwd/backward walking with U UE support on  counter    PATIENT EDUCATION: Education details: OM results, PT POC, conitnue HEP Person educated: Patient and Spouse Education method: Explanation, Demonstration, and Verbal cues Education comprehension: verbalized understanding and needs further education     HOME EXERCISE PROGRAM: Access Code: [email or web address] URL: [email or web address] Date: 09/10/2021 Prepared by:    Exercises - Sit to Stand with Counter Support  - 1-2 x daily - 7 x weekly - 1 sets - 10 reps - Seated Heel Raise  - 1 x daily - 7 x weekly - 3 sets - 10 reps - Seated Toe Raise  - 1 x daily - 7 x weekly - 3 sets - 10 reps - Seated Scapular Retraction  - 1 x daily - 7 x weekly - 3 sets - 10 reps - Seated March  - 1 x daily - 7 x weekly - 3 sets - 10 reps - Seated Long Arc Quad  - 1 x daily - 7 x weekly - 3 sets - 10 reps     GOALS: Goals reviewed with patient? Yes   SHORT TERM GOALS: Target date: 10/02/2021   Pt will perform initial HEP w/min A from wife for improved strength, balance, transfers and gait.   Baseline: established  Goal status: MET   2.  Pt will improve gait velocity to at least 1.4 ft/s w/LRAD and S* for improved gait efficiency and safety   Baseline: 1.19 ft/s w/SPC and CGA; .71m/s or 2.35ft/s with rollator and supervision  Goal status: MET   3.  TUG to be assessed and STG/LTG written  Baseline: 45.56s with CGA Goal status: MET   4.  Pt will improve 5 x STS to less than or equal to 48 seconds w/BUE support to demonstrate improved functional strength and transfer efficiency.    Baseline: 55.06s w/BUE support; 37.41s with B UE Goal status: MET   5.  Pt will ambulate 150' or greater w/LRAD and S* for improved safety with functional mobility  Baseline: ambulating w/SPC and CGA; supervision with rollator  Goal status: MET  6. Pt will improve TUG to </= 37s secs to demonstrated   reduced fall risk  Baseline: 45.56s; 37s with rollator  Goal Status: MET       LONG  TERM GOALS: Target date: 10/30/2021   Pt will perform final HEP w/min A from wife for improved strength, balance, transfers and gait. Baseline:  Goal status: INITIAL   2.  Pt will improve TUG to </= 28 secs to demonstrated reduced fall risk  Baseline: 45.56s Goal status: INITIAL   3.  Pt will improve 5 x STS to less than or equal to 41 seconds w/BUE support to demonstrate improved functional strength and transfer efficiency.    Baseline: 55.06s w/BUE support  Goal status: INITIAL   4.  Pt and wife will verbalize and demonstrate fall prevention strategies in the home for reduced fall risk and improved safety  Baseline:  Goal status: INITIAL   5.  Pt will improve gait velocity to at least 3ft/s  w/LRAD and S* for improved gait efficiency and reduced fall risk   Baseline: 1.19 ft/s w/SPC and CGA Goal status: REVISED   6.  Pt will ambulate 300' or greater w/LRAD and S* for improved global endurance and functional mobility independence  Baseline: ambulating w/SPC and CGA Goal status: INITIAL   ASSESSMENT:   CLINICAL IMPRESSION: Patient seen for skilled PT session with emphasis on goal assessment and large amplitude reciprocal coordination. He met 6/6 STG. Patient completed the Timed Up and Go test (TUG) in 37 seconds.  Geriatrics: need for further assessment of fall risk: ? 12 sec; Recurrent falls: > 15 sec; Vestibular Disorders fall risk: > 15 sec; Parkinson's Disease fall risk: > 16 sec (SRALAB.org, 2023). 10 Meter Walk Test: Patient instructed to walk 10 meters (32.8 ft) as quickly and as safely as possible at their normal speed x2 and at a fast speed x2. Time measured from 2 meter mark to 8 meter mark to accommodate ramp-up and ramp-down.  Normal speed: .71m/s or 2.35ft/s Cut off scores: <0.4 m/s = household Ambulator, 0.4-0.8 m/s = limited community Ambulator, >0.8 m/s = community Ambulator, >1.2 m/s = crossing a street, <1.0 = increased fall risk MCID 0.05 m/s (small), 0.13 m/s  (moderate), 0.06 m/s (significant)  (ANPTA Core Set of Outcome Measures for Adults with Neurologic Conditions, 2018). Five times Sit to Stand Test (FTSS) Method: TIME: 37.41 sec Cut off scores indicative of increased fall risk: >12 sec CVA, >16 sec PD, >13 sec vestibular (ANPTA Core Set of Outcome Measures for Adults with Neurologic Conditions, 2018). Patient making significant improvements in his overall gait speed and ability to initiate tasks. He does continue to report increased fatigue with minimal activity and some evidence for freezing/festinating gait especially with backward steps. Continue POC.     OBJECTIVE IMPAIRMENTS Abnormal gait, decreased activity tolerance, decreased balance, decreased cognition, decreased coordination, decreased endurance, decreased knowledge of condition, decreased knowledge of use of DME, decreased mobility, difficulty walking, decreased strength, decreased safety awareness, and pain.    ACTIVITY LIMITATIONS carrying, lifting, bending, standing, squatting, sleeping, stairs, transfers, bed mobility, reach over head, and locomotion level   PARTICIPATION LIMITATIONS: meal prep, cleaning, laundry, medication management, personal finances, interpersonal relationship, driving, shopping, community activity, and yard work   PERSONAL FACTORS Age, Behavior pattern, Fitness, Past/current experiences, Time since onset of injury/illness/exacerbation, Transportation, and 1 comorbidity: Dementia  are also affecting patient's functional outcome.    REHAB POTENTIAL: Fair due to poor prognosis associated w/Lewy Body Dementia    CLINICAL DECISION MAKING: Evolving/moderate complexity   EVALUATION COMPLEXITY: Moderate   PLAN: PT   FREQUENCY: 2x/week   PT DURATION: 8 weeks   PLANNED INTERVENTIONS: Therapeutic exercises, Therapeutic activity, Neuromuscular re-education, Balance training, Gait training, Patient/Family education, Self Care, Joint mobilization, Stair training, DME  instructions, Manual therapy, and Re-evaluation   PLAN FOR NEXT SESSION: Goal assessment, Obstacle course w/rollator, eccentric heel taps, sit <>Stands, step length, scifit for large amplitude coordination, turns, lateral stepping. Exercises for posture.     A , PT, DPT, CBIS  10/01/21 9:30 AM      

## 2021-10-03 ENCOUNTER — Encounter: Payer: Self-pay | Admitting: Physical Therapy

## 2021-10-03 ENCOUNTER — Ambulatory Visit: Payer: Medicare HMO | Admitting: Physical Therapy

## 2021-10-03 DIAGNOSIS — R2681 Unsteadiness on feet: Secondary | ICD-10-CM

## 2021-10-03 DIAGNOSIS — M6281 Muscle weakness (generalized): Secondary | ICD-10-CM

## 2021-10-03 DIAGNOSIS — R2689 Other abnormalities of gait and mobility: Secondary | ICD-10-CM | POA: Diagnosis not present

## 2021-10-03 NOTE — Therapy (Signed)
OUTPATIENT PHYSICAL THERAPY TREATMENT NOTE   Patient Name: Joe Murray MRN: [id number] DOB:09/11/1940, 81 y.o., male Today's Date: 10/03/2021  PCP: Alysia Penna, MD REFERRING PROVIDER: Alysia Penna, MD  END OF SESSION:   PT End of Session - 10/03/21 0846     Visit Number 9    Number of Visits 17    Date for PT Re-Evaluation 11/06/21    Authorization Type [employer]    Progress Note Due on Visit 10    PT Start Time 0845    PT Stop Time 0927    PT Time Calculation (min) 42 min    Equipment Utilized During Treatment --    Activity Tolerance Patient tolerated treatment well    Behavior During Therapy [facility] for tasks assessed/performed                 Past Medical History:  Diagnosis Date   Alzheimer's dementia [facility])    sees Dr. Ellouise Newer   CAD S/P percutaneous coronary angioplasty 10/'03; 3/'09; 7/*09; 1/'17    a. Dr. Ellyn Hack; '03 - Cypher DES 3.0 mm 33 mm proximal-mid LAD (details D1 with ostial 60%); 3/'09 dRCA 2.75 mm x 13 mm Cypher DES (2.8 mm); 7/'09 pRCA 3.0 mm x 18 m Cypher DES (3.25 mm);; (2009) 2D Echo - EF 55%, (November 2014) nonischemic Myoview;; b. Promus DES to RCA and mid LAD 02/26/2015   Chronotropic incompetence 12/2011   ETT 11/2017: Normal blood pressure response.  No EKG changes.  Exercise stopped due to fatigue and dyspnea.  Heart rate increased to low 90s from 50s.  Suggest chronotropic incompetence.  Severely impaired exercise capacity. -->  Results reviewed with electrophysiology: Still not indication for pacemaker.   Diabetes mellitus type 2 in obese [facility])    CAD   Diverticulosis    Dyslipidemia, goal LDL below 70    on [employer] study medicaion.   ED (erectile dysfunction)    GERD (gastroesophageal reflux disease)    Hemorrhoids 07/2002   Internal and External   Insomnia    Nodular basal cell carcinoma (BCC) 05/08/2019   left ear (cx8f)   Non-STEMI (non-ST elevated [place] infarction[place]   a. Proximal LAD tandem  lesions -- long DES stent covering both;; b. Jan 2017: mRCA PCI, mLAD PCI   Parkinson's disease ([facility]    sees Dr. KEllouise Newer  Persistent sinus bradycardia    Personal history of colonic adenomas 09/06/2012   Psoriasis    Resting HR in 54s   SCC (squamous cell carcinoma) 11/13/2001   right upper outer arm (txpbx)   SCC (squamous cell carcinoma) 11/13/2009   right upper ear rim (cx3 531f   SCC (squamous cell carcinoma) 01/20/2010   right nose ([id number]  SCC (squamous cell carcinoma) 03/28/2014   right side of nose ( [id number] SCC (squamous cell carcinoma) 06/03/2016   right side of nose   SCC (squamous cell carcinoma) 05/08/2019   right ear inferior ([id number]   SCCA (squamous cell carcinoma) of skin 04/23/2021   Right Temporal Scalp (well diff) (tx p bx)   Spasm of esophagus    Squamous cell carcinoma in situ (SCCIS) 05/08/2019   Right Ear Inferior   Squamous cell carcinoma of skin 11/08/2001   top right rim ear    SVT (supraventricular tachycardia) (HCC) [place]   Vertigo, benign positional    Past Surgical History:  Procedure Laterality Date   CARDIAC CATHETERIZATION N/A 02/26/2015   Procedure: Left Heart Cath  and Coronary Angiography;  Surgeon: [name] [name], MD: mRCA 99% --> PCI. mLAD 75%-> FFR Guided PCI, Mod ISR in pLAD & pRCA   CARDIAC CATHETERIZATION N/A 02/26/2015   Procedure: Coronary Stent Intervention;  Surgeon: [name] [name], MD;  Location: [facility] INVASIVE CV LAB: mRCA Promus Premier DES 3.0 x 12 (3.5), mLAD Promus Premier DES 2.75 x 20 (3.0)   CARDIAC CATHETERIZATION N/A 02/26/2015   Procedure: Intravascular Pressure Wire/FFR Study;  Surgeon: [name] [name], MD;  Location: [facility] INVASIVE CV LAB;  Service: Cardiovascular: LAD 75% - FFR + -> PCI   CATARACT EXTRACTION  2004,2013   COLONOSCOPY  12/16/2017   per Dr. Gessner, adenomatous polyps, no repeats due to age    CORONARY ANGIOPLASTY WITH STENT PLACEMENT  LAD - 2003, RCA 3 & 7/ '09   Cypher 3.0 x 32 mid LAD; Cyper  2.75 x 13 - distal RCA, 3.0 x 18 Prox RCA   ESOPHAGOGASTRODUODENOSCOPY  08-11-02   esophageal dilation per Dr. Gessner   HERNIA REPAIR     right inguinal    NM MYOVIEW LTD  Nov 2014   ~8 METS; EF 60%, no ischemia or infarction   PERCUTANEOUS CORONARY STENT INTERVENTION (PCI-S)  11/23/2001   NSTEMI: Prox-Mid LAD tandem ~80% lesions on either side of D1 (with 80% lesion) -- Cypher DES 3.0 mm x 33 m (covering both lesions)    PERCUTANEOUS CORONARY STENT INTERVENTION (PCI-S)  04/27/2007   Unstable Angina: Distal RCA 95%: Cypher 2.75x13  (2.8 mm); residual focal ~60%ISR in LAD stent, 60% D1 ostial (no PTCA on LAD due to no ischemia on ST)   PERCUTANEOUS CORONARY STENT INTERVENTION (PCI-S)  09/21/2007   Bradycardia & Unstable Angina: Prox RCA 70% - PCI Cypher DES 3.0 mm x 18 mm  (3.25 mm); ostial 60-70% jailed D1. LAD & distal RCA stents patent   SHOULDER SURGERY     left rotator cuff, Dr. Gioffre   TRANSTHORACIC ECHOCARDIOGRAM  March 2009   Normal LV size and function, EF 55%. Mild MR; mild RV dilation.   TRANSTHORACIC ECHOCARDIOGRAM  02/2015   EF 60-65%. Moderate concentric LVH. Normal function with normal regional wall motion. GR 1 DD   WRIST FRACTURE SURGERY     right   Patient Active Problem List   Diagnosis Date Noted   Myalgia due to statin 01/27/2021   Parkinson's disease (HCC) 11/07/2020   Alzheimer's dementia (HCC) 11/07/2020   Intermittent claudication (HCC) 12/01/2017   Trochanteric bursitis of right hip 09/04/2017   Fuchs' corneal dystrophy 08/14/2016   Essential hypertension -> allowing permissive hypertension.  Medication stopped because of fatigue and orthostatic dizziness. 03/05/2015   NSTEMI (non-ST elevated myocardial infarction) (HCC)    Coronary artery disease involving native coronary artery of native heart without angina pectoris    Vitamin D deficiency 07/04/2014   Fatigue 01/08/2014   Obesity (BMI 30-39.9) 01/12/2013   DM (diabetes mellitus), type 2 with  complications - CAD    Dyslipidemia, goal LDL below 70    Exertional shortness of breath 01/12/2012   Chronotropic incompetence 12/25/2011   Vertigo with mild postural lightheadedness. 08/13/2009   ERECTILE DYSFUNCTION, ORGANIC 05/01/2009   GERD 04/18/2008   BPH with urinary obstruction 07/27/2007   CAD S/P percutaneous coronary angioplasty - LADx2, & RCA x 3 07/12/2001    Class: History of    REFERRING DIAG: G30.1,F02.818 (ICD-10-CM) - Alzheimer's dementia, late onset, with behavioral disturbance (HCC) G20 (ICD-10-CM) - Parkinsonism, unspecified Parkinsonism type (HCC)   THERAPY DIAG:    Unsteadiness on feet  Muscle weakness (generalized)  Other abnormalities of gait and mobility  Rationale for Evaluation and Treatment Rehabilitation  PERTINENT HISTORY: hypertension, hyperlipidemia, CAD, diabetes. Neuropsychological evaluation in 01/2020 indicated memory storage type problems, executive dysfunction, diminished processing efficiency, likely due to Alzheimer's disease. His parkinsonism suggests there may be a Lewy Body spectrum component.   PRECAUTIONS: fall  SUBJECTIVE: Pt reports some pain his toe this morning. HEP is going well, no questions. No falls or near falls since last session.  PAIN:  Are you having pain? Yes: NPRS scale: 7/10 Pain location: L big toe Pain description: Achy *reports that when MD trims toe nails, that helps the pain     TODAY'S TREATMENT:  NMR: Cone weave with rollator 4 x 30 ft with CGA -with cones spaced 3 ft apart pt runs into cones 75% of the time -with cones spaces 4 ft apart pt runs into cones 50% of the time -max cueing for obstacle avoidance and safe rollator management  Obstacle course with rollator and CGA: 4" step-up/down and pole stepover with focus on safe rollator management and cues for increaesd step length and clearance  4" hurdles in // bars with gait: -forwards step-overs leading with RLE: no instances of foot  catching -forwards step-overs leading with LLE: L or R foot catches 50% of the time - lateral step-overs leading with LLE catches 25% of the time - lateral step-overs leading with RLE no instances of foot catching  Standing forward 4" hurdle step-overs in // bars -x 10 reps with LLE (no instances of foot catching) -x 10 reps with RLE (hits hurdle 4x)   PATIENT EDUCATION: Education details: continue HEP Person educated: Patient and Spouse Education method: Explanation, Demonstration, and Verbal cues Education comprehension: verbalized understanding and needs further education     HOME EXERCISE PROGRAM: Access Code: [email or web address] URL: [email or web address] Date: 09/10/2021 Prepared by: Estevan Ryder  Exercises - Sit to Stand with Counter Support  - 1-2 x daily - 7 x weekly - 1 sets - 10 reps - Seated Heel Raise  - 1 x daily - 7 x weekly - 3 sets - 10 reps - Seated Toe Raise  - 1 x daily - 7 x weekly - 3 sets - 10 reps - Seated Scapular Retraction  - 1 x daily - 7 x weekly - 3 sets - 10 reps - Seated March  - 1 x daily - 7 x weekly - 3 sets - 10 reps - Seated Long Arc Quad  - 1 x daily - 7 x weekly - 3 sets - 10 reps     GOALS: Goals reviewed with patient? Yes   SHORT TERM GOALS: Target date: 10/02/2021   Pt will perform initial HEP w/min A from wife for improved strength, balance, transfers and gait.   Baseline: established  Goal status: MET   2.  Pt will improve gait velocity to at least 1.4 ft/s w/LRAD and S* for improved gait efficiency and safety   Baseline: 1.19 ft/s w/SPC and CGA; .36ms or 2.35fs with rollator and supervision  Goal status: MET   3.  TUG to be assessed and STG/LTG written  Baseline: 45.56s with CGA Goal status: MET   4.  Pt will improve 5 x STS to less than or equal to 48 seconds w/BUE support to demonstrate improved functional strength and transfer efficiency.    Baseline: 55.06s w/BUE support; 37.41s with B UE Goal status: MET    5.  Pt will ambulate 150'  or greater w/LRAD and S* for improved safety with functional mobility  Baseline: ambulating w/SPC and CGA; supervision with rollator  Goal status: MET  6. Pt will improve TUG to </= 37s secs to demonstrated reduced fall risk  Baseline: 45.56s; 37s with rollator  Goal Status: MET       LONG TERM GOALS: Target date: 10/30/2021   Pt will perform final HEP w/min A from wife for improved strength, balance, transfers and gait. Baseline:  Goal status: INITIAL   2.  Pt will improve TUG to </= 28 secs to demonstrated reduced fall risk  Baseline: 45.56s Goal status: INITIAL   3.  Pt will improve 5 x STS to less than or equal to 41 seconds w/BUE support to demonstrate improved functional strength and transfer efficiency.    Baseline: 55.06s w/BUE support  Goal status: INITIAL   4.  Pt and wife will verbalize and demonstrate fall prevention strategies in the home for reduced fall risk and improved safety  Baseline:  Goal status: INITIAL   5.  Pt will improve gait velocity to at least 29f/s  w/LRAD and S* for improved gait efficiency and reduced fall risk   Baseline: 1.19 ft/s w/SPC and CGA Goal status: REVISED   6.  Pt will ambulate 300' or greater w/LRAD and S* for improved global endurance and functional mobility independence  Baseline: ambulating w/SPC and CGA Goal status: INITIAL   ASSESSMENT:   CLINICAL IMPRESSION: Emphasis of skilled PT session on NMR, safe gait and obstacle navigation with rollator, and increasing step length and clearance with use of 4" hurdles. Pt needs max cueing for safe rollator management around obstacles to avoid hitting them and exhibits frequent catching of either LE on 4" hurdles when navigating over them both forwards and laterally. Pt exhibits poor carryover of increased step length and step clearance to gait with rollator. Continue POC.    OBJECTIVE IMPAIRMENTS Abnormal gait, decreased activity tolerance, decreased  balance, decreased cognition, decreased coordination, decreased endurance, decreased knowledge of condition, decreased knowledge of use of DME, decreased mobility, difficulty walking, decreased strength, decreased safety awareness, and pain.    ACTIVITY LIMITATIONS carrying, lifting, bending, standing, squatting, sleeping, stairs, transfers, bed mobility, reach over head, and locomotion level   PARTICIPATION LIMITATIONS: meal prep, cleaning, laundry, medication management, personal finances, interpersonal relationship, driving, shopping, community activity, and yard work   PERSONAL FACTORS Age, Behavior pattern, Fitness, Past/current experiences, Time since onset of injury/illness/exacerbation, Transportation, and 1 comorbidity: Dementia  are also affecting patient's functional outcome.    REHAB POTENTIAL: Fair due to poor prognosis associated w/Lewy Body Dementia    CLINICAL DECISION MAKING: Evolving/moderate complexity   EVALUATION COMPLEXITY: Moderate   PLAN: PT FREQUENCY: 2x/week   PT DURATION: 8 weeks   PLANNED INTERVENTIONS: Therapeutic exercises, Therapeutic activity, Neuromuscular re-education, Balance training, Gait training, Patient/Family education, Self Care, Joint mobilization, Stair training, DME instructions, Manual therapy, and Re-evaluation   PLAN FOR NEXT SESSION: Obstacle course w/rollator, eccentric heel taps, sit <>Stands, step length, scifit for large amplitude coordination, turns, lateral stepping. Exercises for posture.      TExcell Seltzer PT, DPT, CSRS   10/03/21 9:28 AM

## 2021-10-08 ENCOUNTER — Encounter: Payer: Self-pay | Admitting: Physical Therapy

## 2021-10-08 ENCOUNTER — Ambulatory Visit: Payer: Medicare HMO | Admitting: Physical Therapy

## 2021-10-08 DIAGNOSIS — M6281 Muscle weakness (generalized): Secondary | ICD-10-CM

## 2021-10-08 DIAGNOSIS — R2681 Unsteadiness on feet: Secondary | ICD-10-CM

## 2021-10-08 DIAGNOSIS — R2689 Other abnormalities of gait and mobility: Secondary | ICD-10-CM

## 2021-10-08 NOTE — Therapy (Signed)
OUTPATIENT PHYSICAL THERAPY TREATMENT NOTE/10th VISIT PN   Patient Name: Joe Murray MRN: [id number] DOB:1940/12/21, 81 y.o., male Today's Date: 10/08/2021  PCP: Alysia Penna, MD REFERRING PROVIDER: Alysia Penna, MD  10th Visit Physical Therapy Progress Note  Dates of Reporting Period: 09/04/21 to 10/08/21   END OF SESSION:   PT End of Session - 10/08/21 0851     Visit Number 10    Number of Visits 17    Date for PT Re-Evaluation 11/06/21    Authorization Type [employer]    Progress Note Due on Visit 10    PT Start Time 0848    PT Stop Time 0929    PT Time Calculation (min) 41 min    Equipment Utilized During Treatment Gait belt    Activity Tolerance Patient tolerated treatment well    Behavior During Therapy [facility] for tasks assessed/performed                  Past Medical History:  Diagnosis Date   Alzheimer's dementia [facility])    sees Dr. Ellouise Newer   CAD S/P percutaneous coronary angioplasty 10/'03; 3/'09; 7/*09; 1/'17    a. Dr. Ellyn Hack; '03 - Cypher DES 3.0 mm 33 mm proximal-mid LAD (details D1 with ostial 60%); 3/'09 dRCA 2.75 mm x 13 mm Cypher DES (2.8 mm); 7/'09 pRCA 3.0 mm x 18 m Cypher DES (3.25 mm);; (2009) 2D Echo - EF 55%, (November 2014) nonischemic Myoview;; b. Promus DES to RCA and mid LAD 02/26/2015   Chronotropic incompetence 12/2011   ETT 11/2017: Normal blood pressure response.  No EKG changes.  Exercise stopped due to fatigue and dyspnea.  Heart rate increased to low 90s from 50s.  Suggest chronotropic incompetence.  Severely impaired exercise capacity. -->  Results reviewed with electrophysiology: Still not indication for pacemaker.   Diabetes mellitus type 2 in obese [facility])    CAD   Diverticulosis    Dyslipidemia, goal LDL below 70    on [employer] study medicaion.   ED (erectile dysfunction)    GERD (gastroesophageal reflux disease)    Hemorrhoids 07/2002   Internal and External   Insomnia    Nodular basal cell carcinoma (BCC)  05/08/2019   left ear (cx62f)   Non-STEMI (non-ST elevated myocardial infarction) ([place] 11/2001   a. Proximal LAD tandem lesions -- long DES stent covering both;; b. Jan 2017: mRCA PCI, mLAD PCI   Parkinson's disease ([facility]    sees Dr. KEllouise Newer  Persistent sinus bradycardia    Personal history of colonic adenomas 09/06/2012   Psoriasis    Resting HR in 54s   SCC (squamous cell carcinoma) 11/13/2001   right upper outer arm (txpbx)   SCC (squamous cell carcinoma) 11/13/2009   right upper ear rim (cx3 59f   SCC (squamous cell carcinoma) 01/20/2010   right nose ([id number]  SCC (squamous cell carcinoma) 03/28/2014   right side of nose ( [id number] SCC (squamous cell carcinoma) 06/03/2016   right side of nose   SCC (squamous cell carcinoma) 05/08/2019   right ear inferior ([id number]   SCCA (squamous cell carcinoma) of skin 04/23/2021   Right Temporal Scalp (well diff) (tx p bx)   Spasm of esophagus    Squamous cell carcinoma in situ (SCCIS) 05/08/2019   Right Ear Inferior   Squamous cell carcinoma of skin 11/08/2001   top right rim ear    SVT (supraventricular tachycardia) (HCC) [place]   Vertigo, benign positional  Past Surgical History:  Procedure Laterality Date   CARDIAC CATHETERIZATION N/A 02/26/2015   Procedure: Left Heart Cath and Coronary Angiography;  Surgeon: Leonie Man, MD: mRCA 99% --> PCI. mLAD 75%-> FFR Guided PCI, Mod ISR in pLAD & pRCA   CARDIAC CATHETERIZATION N/A 02/26/2015   Procedure: Coronary Stent Intervention;  Surgeon: Leonie Man, MD;  Location: [facility] INVASIVE CV LAB: mRCA Promus Premier DES 3.0 x 12 (3.5), mLAD Promus Premier DES 2.75 x 20 (3.0)   CARDIAC CATHETERIZATION N/A 02/26/2015   Procedure: Intravascular Pressure Wire/FFR Study;  Surgeon: Leonie Man, MD;  Location: [place] CV LAB;  Service: Cardiovascular: LAD 75% - FFR + -> PCI   CATARACT EXTRACTION  [id number]   COLONOSCOPY  12/16/2017   per Dr. Carlean Purl, adenomatous polyps, no  repeats due to age    [place]  LAD - 2003, RCA 3 & 7/ '09   Cypher 3.0 x 32 mid LAD; Cyper 2.75 x 13 - distal RCA, 3.0 x 18 Prox RCA   ESOPHAGOGASTRODUODENOSCOPY  08-11-02   esophageal dilation per Dr. Carlean Purl   HERNIA REPAIR     right inguinal    NM MYOVIEW LTD  Nov 2014   ~8 METS; EF 60%, no ischemia or infarction   PERCUTANEOUS CORONARY STENT INTERVENTION (PCI-S)  11/23/2001   NSTEMI: Prox-Mid LAD tandem ~80% lesions on either side of D1 (with 80% lesion) -- Cypher DES 3.0 mm x 33 m (covering both lesions)    PERCUTANEOUS CORONARY STENT INTERVENTION (PCI-S)  04/27/2007   Unstable Angina: Distal RCA 95%: Cypher 2.75x13  (2.8 mm); residual focal ~60%ISR in LAD stent, 60% D1 ostial (no PTCA on LAD due to no ischemia on ST)   PERCUTANEOUS CORONARY STENT INTERVENTION (PCI-S)  09/21/2007   Bradycardia & Unstable Angina: Prox RCA 70% - PCI Cypher DES 3.0 mm x 18 mm  (3.25 mm); ostial 60-70% jailed D1. LAD & distal RCA stents patent   SHOULDER SURGERY     left rotator cuff, Dr. Gladstone Lighter   TRANSTHORACIC ECHOCARDIOGRAM  March 2009   Normal LV size and function, EF 55%. Mild MR; mild RV dilation.   TRANSTHORACIC ECHOCARDIOGRAM  02/2015   EF 60-65%. Moderate concentric LVH. Normal function with normal regional wall motion. GR 1 DD   WRIST FRACTURE SURGERY     right   Patient Active Problem List   Diagnosis Date Noted   Myalgia due to statin 01/27/2021   Parkinson's disease ([place]) 11/07/2020   Alzheimer's dementia ([place]) 11/07/2020   Intermittent claudication ([place]) 12/01/2017   Trochanteric bursitis of right hip 09/04/2017   Fuchs' corneal dystrophy 08/14/2016   Essential hypertension -> allowing permissive hypertension.  Medication stopped because of fatigue and orthostatic dizziness. 03/05/2015   NSTEMI (non-ST elevated myocardial infarction) [facility])    Coronary artery disease involving native coronary artery of native heart without angina pectoris    Vitamin D  deficiency 07/04/2014   Fatigue 01/08/2014   Obesity (BMI 30-39.9) 01/12/2013   DM (diabetes mellitus), type 2 with complications - CAD    Dyslipidemia, goal LDL below 70    Exertional shortness of breath 01/12/2012   Chronotropic incompetence 12/25/2011   Vertigo with mild postural lightheadedness. 08/13/2009   ERECTILE DYSFUNCTION, ORGANIC 05/01/2009   GERD 04/18/2008   BPH with urinary obstruction 07/27/2007   CAD S/P percutaneous coronary angioplasty - LADx2, & RCA x 3 07/12/2001    Class: History of    REFERRING DIAG: G30.1,F02.818 (ICD-10-CM) - Alzheimer's  dementia, late onset, with behavioral disturbance ([place]) G20 (ICD-10-CM) - Parkinsonism, unspecified Parkinsonism type ([place])   THERAPY DIAG:  Unsteadiness on feet  Muscle weakness (generalized)  Other abnormalities of gait and mobility  Rationale for Evaluation and Treatment Rehabilitation  PERTINENT HISTORY: hypertension, hyperlipidemia, CAD, diabetes. Neuropsychological evaluation in 01/2020 indicated memory storage type problems, executive dysfunction, diminished processing efficiency, likely due to Alzheimer's disease. His parkinsonism suggests there may be a Lewy Body spectrum component.   PRECAUTIONS: fall  SUBJECTIVE: No changes, no falls. Reports exercises are going well. "They get a little bit better everytime".   PAIN:  Are you having pain? Yes: NPRS scale: 6/10 Pain location: L big toe Pain description: Achy *reports that when MD trims toe nails, that helps the pain     TODAY'S TREATMENT:  Therapeutic Exercise  SciFit with BUE/BLEs at gear 3.5 > 4 for 8 minutes for activity tolerance/endurance, ROM, and reciprocal movement patterns. Cues for increased knee extension of LLE.   NMR Forwards/retro walking down and back x5 reps in // bars, cues for posture step length, 2 reps with single UE support, 3 reps with no UE support, pt needing min guard at times for balance with retro gait. Pt with decr step length  at times.  Alternating marching with BUE> single UE support 2 sets of 10 reps for SLS, improved foot clearance, with use of boomwhacker as visual cue on how high to lift leg. Pt reporting his toes felt better after performing this exercise.  Wide BOS trunk rotation reaching outside of BOS in different directions across body to grab bean bag from PT tech and then tossing into crate x15 reps each side, performed without UE support, min guard as needed for balance Step up/up and down/down off air ex x12 reps each leg, starting with UE support > none.  On air ex; using mirror as visual cue for tall posture alternating UE lifts over head x12 reps each side, pt losing his balance at times to the R.      Gait Training Gait pattern: step to pattern, step through pattern, decreased arm swing- Right, decreased arm swing- Left, decreased step length- Left, decreased stride length, decreased hip/knee flexion- Right, decreased hip/knee flexion- Left, decreased ankle dorsiflexion- Right, decreased ankle dorsiflexion- Left, Right foot flat, Left foot flat, shuffling, trunk flexed, poor foot clearance- Right, and poor foot clearance- Left Distance walked: Into out of clinic, plus additional distances in clinic.  Assistive device utilized: Environmental consultant - 4 wheeled  Level of assistance: SBA Comments: Cued for safe AD management and increased stride length/foot clearance.    PATIENT EDUCATION: Education details: continue HEP Person educated: Patient and Spouse Education method: Explanation, Demonstration, and Verbal cues Education comprehension: verbalized understanding and needs further education     HOME EXERCISE PROGRAM: Access Code: [email or web address] URL: [email or web address] Date: 09/10/2021 Prepared by: Estevan Ryder  Exercises - Sit to Stand with Counter Support  - 1-2 x daily - 7 x weekly - 1 sets - 10 reps - Seated Heel Raise  - 1 x daily - 7 x weekly - 3 sets - 10 reps - Seated Toe Raise  -  1 x daily - 7 x weekly - 3 sets - 10 reps - Seated Scapular Retraction  - 1 x daily - 7 x weekly - 3 sets - 10 reps - Seated March  - 1 x daily - 7 x weekly - 3 sets - 10 reps - Seated Long Arc Quad  - 1 x daily -  7 x weekly - 3 sets - 10 reps     GOALS: Goals reviewed with patient? Yes   SHORT TERM GOALS: Target date: 10/02/2021   Pt will perform initial HEP w/min A from wife for improved strength, balance, transfers and gait.   Baseline: established  Goal status: MET   2.  Pt will improve gait velocity to at least 1.4 ft/s w/LRAD and S* for improved gait efficiency and safety   Baseline: 1.19 ft/s w/SPC and CGA; .73ms or 2.322fs with rollator and supervision  Goal status: MET   3.  TUG to be assessed and STG/LTG written  Baseline: 45.56s with CGA Goal status: MET   4.  Pt will improve 5 x STS to less than or equal to 48 seconds w/BUE support to demonstrate improved functional strength and transfer efficiency.    Baseline: 55.06s w/BUE support; 37.41s with B UE Goal status: MET   5.  Pt will ambulate 150' or greater w/LRAD and S* for improved safety with functional mobility  Baseline: ambulating w/SPC and CGA; supervision with rollator  Goal status: MET  6. Pt will improve TUG to </= 37s secs to demonstrated reduced fall risk  Baseline: 45.56s; 37s with rollator  Goal Status: MET       LONG TERM GOALS: Target date: 10/30/2021   Pt will perform final HEP w/min A from wife for improved strength, balance, transfers and gait. Baseline:  Goal status: INITIAL   2.  Pt will improve TUG to </= 28 secs to demonstrated reduced fall risk  Baseline: 45.56s Goal status: INITIAL   3.  Pt will improve 5 x STS to less than or equal to 41 seconds w/BUE support to demonstrate improved functional strength and transfer efficiency.    Baseline: 55.06s w/BUE support  Goal status: INITIAL   4.  Pt and wife will verbalize and demonstrate fall prevention strategies in the home for  reduced fall risk and improved safety  Baseline:  Goal status: INITIAL   5.  Pt will improve gait velocity to at least 61f42f  w/LRAD and S* for improved gait efficiency and reduced fall risk   Baseline: 1.19 ft/s w/SPC and CGA Goal status: REVISED   6.  Pt will ambulate 300' or greater w/LRAD and S* for improved global endurance and functional mobility independence  Baseline: ambulating w/SPC and CGA Goal status: INITIAL   ASSESSMENT:   CLINICAL IMPRESSION: 10th visit PN: STGs assessed on 10/01/21 and pt has met 6 out of 6 STGs. Pt has improved gait speed with rollator to 2.35 ft/sec, indicating improved community mobility. Pt improved 5x sit <> stand time with BUE support to 37.41 seconds (previously was 55.06 seconds), indicating improved functional strength/transfer ability. Remainder of session focused on BLE strengthening/endurance and balance strategies working on decr UE support. Pt tolerated session well. Will continue to progress towards LTGs.     OBJECTIVE IMPAIRMENTS Abnormal gait, decreased activity tolerance, decreased balance, decreased cognition, decreased coordination, decreased endurance, decreased knowledge of condition, decreased knowledge of use of DME, decreased mobility, difficulty walking, decreased strength, decreased safety awareness, and pain.    ACTIVITY LIMITATIONS carrying, lifting, bending, standing, squatting, sleeping, stairs, transfers, bed mobility, reach over head, and locomotion level   PARTICIPATION LIMITATIONS: meal prep, cleaning, laundry, medication management, personal finances, interpersonal relationship, driving, shopping, community activity, and yard work   PERSONAL FACTORS Age, Behavior pattern, Fitness, Past/current experiences, Time since onset of injury/illness/exacerbation, Transportation, and 1 comorbidity: Dementia  are also affecting patient's functional outcome.  REHAB POTENTIAL: Fair due to poor prognosis associated w/Lewy Body Dementia     CLINICAL DECISION MAKING: Evolving/moderate complexity   EVALUATION COMPLEXITY: Moderate   PLAN: PT FREQUENCY: 2x/week   PT DURATION: 8 weeks   PLANNED INTERVENTIONS: Therapeutic exercises, Therapeutic activity, Neuromuscular re-education, Balance training, Gait training, Patient/Family education, Self Care, Joint mobilization, Stair training, DME instructions, Manual therapy, and Re-evaluation   PLAN FOR NEXT SESSION: Obstacle course w/rollator, eccentric heel taps, sit <>Stands, step length, scifit for large amplitude coordination, turns, lateral stepping. Exercises for posture.     Janann August, PT, DPT 10/08/21 10:45 AM

## 2021-10-10 ENCOUNTER — Ambulatory Visit: Payer: Medicare HMO | Admitting: Physical Therapy

## 2021-10-10 ENCOUNTER — Encounter: Payer: Self-pay | Admitting: Physical Therapy

## 2021-10-10 DIAGNOSIS — R2689 Other abnormalities of gait and mobility: Secondary | ICD-10-CM

## 2021-10-10 DIAGNOSIS — R2681 Unsteadiness on feet: Secondary | ICD-10-CM

## 2021-10-10 DIAGNOSIS — M6281 Muscle weakness (generalized): Secondary | ICD-10-CM

## 2021-10-10 NOTE — Therapy (Signed)
OUTPATIENT PHYSICAL THERAPY TREATMENT NOTE   Patient Name: Joe Murray MRN: [id number] DOB:[birth year], 81 y.o., male Today's Date: 10/10/2021  PCP: Alysia Penna, MD REFERRING PROVIDER: Alysia Penna, MD   END OF SESSION:   PT End of Session - 10/10/21 0851     Visit Number 11    Number of Visits 17    Date for PT Re-Evaluation 11/06/21    Authorization Type [employer]    Progress Note Due on Visit 10    PT Start Time 0848    PT Stop Time 0929    PT Time Calculation (min) 41 min    Equipment Utilized During Treatment Gait belt    Activity Tolerance Patient tolerated treatment well    Behavior During Therapy [facility] for tasks assessed/performed                  Past Medical History:  Diagnosis Date   Alzheimer's dementia [facility])    sees Dr. Ellouise Newer   CAD S/P percutaneous coronary angioplasty 10/'03; 3/'09; 7/*09; 1/'17    a. Dr. Ellyn Hack; '03 - Cypher DES 3.0 mm 33 mm proximal-mid LAD (details D1 with ostial 60%); 3/'09 dRCA 2.75 mm x 13 mm Cypher DES (2.8 mm); 7/'09 pRCA 3.0 mm x 18 m Cypher DES (3.25 mm);; (2009) 2D Echo - EF 55%, (November 2014) nonischemic Myoview;; b. Promus DES to RCA and mid LAD 02/26/2015   Chronotropic incompetence 12/2011   ETT 11/2017: Normal blood pressure response.  No EKG changes.  Exercise stopped due to fatigue and dyspnea.  Heart rate increased to low 90s from 50s.  Suggest chronotropic incompetence.  Severely impaired exercise capacity. -->  Results reviewed with electrophysiology: Still not indication for pacemaker.   Diabetes mellitus type 2 in obese [facility])    CAD   Diverticulosis    Dyslipidemia, goal LDL below 70    on [employer] study medicaion.   ED (erectile dysfunction)    GERD (gastroesophageal reflux disease)    Hemorrhoids 07/2002   Internal and External   Insomnia    Nodular basal cell carcinoma (BCC) 05/08/2019   left ear (cx36f)   Non-STEMI (non-ST elevated myocardial infarction) ([place] 11/2001   a. Proximal  LAD tandem lesions -- long DES stent covering both;; b. Jan 2017: mRCA PCI, mLAD PCI   Parkinson's disease ([facility]    sees Dr. KEllouise Newer  Persistent sinus bradycardia    Personal history of colonic adenomas 09/06/2012   Psoriasis    Resting HR in 54s   SCC (squamous cell carcinoma) 11/13/2001   right upper outer arm (txpbx)   SCC (squamous cell carcinoma) 11/13/2009   right upper ear rim (cx3 559f   SCC (squamous cell carcinoma) 01/20/2010   right nose ([id number]  SCC (squamous cell carcinoma) 03/28/2014   right side of nose ( [id number] SCC (squamous cell carcinoma) 06/03/2016   right side of nose   SCC (squamous cell carcinoma) 05/08/2019   right ear inferior ([id number]   SCCA (squamous cell carcinoma) of skin 04/23/2021   Right Temporal Scalp (well diff) (tx p bx)   Spasm of esophagus    Squamous cell carcinoma in situ (SCCIS) 05/08/2019   Right Ear Inferior   Squamous cell carcinoma of skin 11/08/2001   top right rim ear    SVT (supraventricular tachycardia) (HCC) [place]3   Vertigo, benign positional    Past Surgical History:  Procedure Laterality Date   CARDIAC CATHETERIZATION N/A 02/26/2015   Procedure:  Left Heart Cath and Coronary Angiography;  Surgeon: Leonie Man, MD: mRCA 99% --> PCI. mLAD 75%-> FFR Guided PCI, Mod ISR in pLAD & pRCA   CARDIAC CATHETERIZATION N/A 02/26/2015   Procedure: Coronary Stent Intervention;  Surgeon: Leonie Man, MD;  Location: [facility] INVASIVE CV LAB: mRCA Promus Premier DES 3.0 x 12 (3.5), mLAD Promus Premier DES 2.75 x 20 (3.0)   CARDIAC CATHETERIZATION N/A 02/26/2015   Procedure: Intravascular Pressure Wire/FFR Study;  Surgeon: Leonie Man, MD;  Location: [place] CV LAB;  Service: Cardiovascular: LAD 75% - FFR + -> PCI   CATARACT EXTRACTION  [id number]   COLONOSCOPY  12/16/2017   per Dr. Carlean Purl, adenomatous polyps, no repeats due to age    [place]  LAD - 2003, RCA 3 & 7/ '09   Cypher 3.0 x 32 mid  LAD; Cyper 2.75 x 13 - distal RCA, 3.0 x 18 Prox RCA   ESOPHAGOGASTRODUODENOSCOPY  08-11-02   esophageal dilation per Dr. Carlean Purl   HERNIA REPAIR     right inguinal    NM MYOVIEW LTD  Nov 2014   ~8 METS; EF 60%, no ischemia or infarction   PERCUTANEOUS CORONARY STENT INTERVENTION (PCI-S)  11/23/2001   NSTEMI: Prox-Mid LAD tandem ~80% lesions on either side of D1 (with 80% lesion) -- Cypher DES 3.0 mm x 33 m (covering both lesions)    PERCUTANEOUS CORONARY STENT INTERVENTION (PCI-S)  04/27/2007   Unstable Angina: Distal RCA 95%: Cypher 2.75x13  (2.8 mm); residual focal ~60%ISR in LAD stent, 60% D1 ostial (no PTCA on LAD due to no ischemia on ST)   PERCUTANEOUS CORONARY STENT INTERVENTION (PCI-S)  09/21/2007   Bradycardia & Unstable Angina: Prox RCA 70% - PCI Cypher DES 3.0 mm x 18 mm  (3.25 mm); ostial 60-70% jailed D1. LAD & distal RCA stents patent   SHOULDER SURGERY     left rotator cuff, Dr. [place] Lighter   TRANSTHORACIC ECHOCARDIOGRAM  March 2009   Normal LV size and function, EF 55%. Mild MR; mild RV dilation.   TRANSTHORACIC ECHOCARDIOGRAM  02/2015   EF 60-65%. Moderate concentric LVH. Normal function with normal regional wall motion. GR 1 DD   WRIST FRACTURE SURGERY     right   Patient Active Problem List   Diagnosis Date Noted   Myalgia due to statin 01/27/2021   Parkinson's disease ([place]) 11/07/2020   Alzheimer's dementia ([place]) 11/07/2020   Intermittent claudication ([place]) 12/01/2017   Trochanteric bursitis of right hip 09/04/2017   Fuchs' corneal dystrophy 08/14/2016   Essential hypertension -> allowing permissive hypertension.  Medication stopped because of fatigue and orthostatic dizziness. 03/05/2015   NSTEMI (non-ST elevated myocardial infarction) [facility])    Coronary artery disease involving native coronary artery of native heart without angina pectoris    Vitamin D deficiency 07/04/2014   Fatigue 01/08/2014   Obesity (BMI 30-39.9) 01/12/2013   DM (diabetes mellitus), type 2 with  complications - CAD    Dyslipidemia, goal LDL below 70    Exertional shortness of breath 01/12/2012   Chronotropic incompetence 12/25/2011   Vertigo with mild postural lightheadedness. 08/13/2009   ERECTILE DYSFUNCTION, ORGANIC 05/01/2009   GERD 04/18/2008   BPH with urinary obstruction 07/27/2007   CAD S/P percutaneous coronary angioplasty - LADx2, & RCA x 3 07/12/2001    Class: History of    REFERRING DIAG: G30.1,F02.818 (ICD-10-CM) - Alzheimer's dementia, late onset, with behavioral disturbance ([place]) G20 (ICD-10-CM) - Parkinsonism, unspecified Parkinsonism type ([place])  THERAPY DIAG:  Unsteadiness on feet  Muscle weakness (generalized)  Other abnormalities of gait and mobility  Rationale for Evaluation and Treatment Rehabilitation  PERTINENT HISTORY: hypertension, hyperlipidemia, CAD, diabetes. Neuropsychological evaluation in 01/2020 indicated memory storage type problems, executive dysfunction, diminished processing efficiency, likely due to Alzheimer's disease. His parkinsonism suggests there may be a Lewy Body spectrum component.   PRECAUTIONS: fall  SUBJECTIVE: No changes, no falls. Going to call the foot doctor to make an appt for his L foot.   PAIN:  Are you having pain? Yes: NPRS scale: 9/10 Pain location: L big toe Pain description: Achy *reports that when MD trims toe nails, that helps the pain     TODAY'S TREATMENT:    NMR Pt performs PWR! Moves in seated position x 10 reps   PWR! Up for improved posture - verbal/demo/tactile cues for elbow extension, opening hands, and scapular retraction and trying to hold for a few seconds.   PWR! Rock for improved weighshifting - use of visual target on where to reach outside of BOS   PWR! Step for improved step initiation - use of 4" floor obstacles to step over, cues for incr intensity, pt with incr difficulty stepping over with LLE.   Cues provided for   Sit > stand with PWR Up! Posture in standing x10 reps with  5-10 second hold, use of mirror as visual cue for posture. With sit <> stands, pt need cues for scooting out towards the edge and incr anterior lean to stand.  Alternating SLS taps to 6" step, x5 reps each side with single UE support, x10 reps without UE support with min guard as needed for balance. Pt with more difficulty lifting up LLE to tap to the step.  At staircase; step ups alternating x10 reps each leg with BUE support, cues for incr foot clearance when stepping on and off step and for sequencing. After activity, pt reporting incr L toe pain With BUE support: alternating lateral stepping over 2" obstacle, x8 reps each side, cues for incr foot clearance esp with RLE.     Gait Training Gait pattern: step to pattern, step through pattern, decreased arm swing- Right, decreased arm swing- Left, decreased step length- Left, decreased stride length, decreased hip/knee flexion- Right, decreased hip/knee flexion- Left, decreased ankle dorsiflexion- Right, decreased ankle dorsiflexion- Left, Right foot flat, Left foot flat, shuffling, trunk flexed, poor foot clearance- Right, and poor foot clearance- Left Distance walked: Into out of clinic, plus additional distances in clinic.  Assistive device utilized: Environmental consultant - 4 wheeled  Level of assistance: SBA Comments: Cued for safe AD management and increased stride length/foot clearance.   Self-Care:  Pt reporting incr L toe pain this session and reports that he has not gone to see a doctor about it (wife present providing this information). PT looked at pt's L foot and there is a small filled looking red pimple/blister? On his L great toe that pt reports started last week. Stressed importance to pt's spouse and pt to make an appt with foot doctor when he leaves PT today as his pain is getting worse and it is limiting his ability to perform standing tasks.    PATIENT EDUCATION: Education details: continue HEP, see Self-Care Person educated: Patient and  Spouse Education method: Explanation, Demonstration, and Verbal cues Education comprehension: verbalized understanding and needs further education     HOME EXERCISE PROGRAM: Access Code: [email or web address] URL: [email or web address] Date: 09/10/2021 Prepared by: Estevan Ryder  Exercises - Sit to Stand  with Counter Support  - 1-2 x daily - 7 x weekly - 1 sets - 10 reps - Seated Heel Raise  - 1 x daily - 7 x weekly - 3 sets - 10 reps - Seated Toe Raise  - 1 x daily - 7 x weekly - 3 sets - 10 reps - Seated Scapular Retraction  - 1 x daily - 7 x weekly - 3 sets - 10 reps - Seated March  - 1 x daily - 7 x weekly - 3 sets - 10 reps - Seated Long Arc Quad  - 1 x daily - 7 x weekly - 3 sets - 10 reps     GOALS: Goals reviewed with patient? Yes   SHORT TERM GOALS: Target date: 10/02/2021   Pt will perform initial HEP w/min A from wife for improved strength, balance, transfers and gait.   Baseline: established  Goal status: MET   2.  Pt will improve gait velocity to at least 1.4 ft/s w/LRAD and S* for improved gait efficiency and safety   Baseline: 1.19 ft/s w/SPC and CGA; .55ms or 2.3543fs with rollator and supervision  Goal status: MET   3.  TUG to be assessed and STG/LTG written  Baseline: 45.56s with CGA Goal status: MET   4.  Pt will improve 5 x STS to less than or equal to 48 seconds w/BUE support to demonstrate improved functional strength and transfer efficiency.    Baseline: 55.06s w/BUE support; 37.41s with B UE Goal status: MET   5.  Pt will ambulate 150' or greater w/LRAD and S* for improved safety with functional mobility  Baseline: ambulating w/SPC and CGA; supervision with rollator  Goal status: MET  6. Pt will improve TUG to </= 37s secs to demonstrated reduced fall risk  Baseline: 45.56s; 37s with rollator  Goal Status: MET       LONG TERM GOALS: Target date: 10/30/2021   Pt will perform final HEP w/min A from wife for improved strength, balance,  transfers and gait. Baseline:  Goal status: INITIAL   2.  Pt will improve TUG to </= 28 secs to demonstrated reduced fall risk  Baseline: 45.56s Goal status: INITIAL   3.  Pt will improve 5 x STS to less than or equal to 41 seconds w/BUE support to demonstrate improved functional strength and transfer efficiency.    Baseline: 55.06s w/BUE support  Goal status: INITIAL   4.  Pt and wife will verbalize and demonstrate fall prevention strategies in the home for reduced fall risk and improved safety  Baseline:  Goal status: INITIAL   5.  Pt will improve gait velocity to at least 43f1f  w/LRAD and S* for improved gait efficiency and reduced fall risk   Baseline: 1.19 ft/s w/SPC and CGA Goal status: REVISED   6.  Pt will ambulate 300' or greater w/LRAD and S* for improved global endurance and functional mobility independence  Baseline: ambulating w/SPC and CGA Goal status: INITIAL   ASSESSMENT:   CLINICAL IMPRESSION: Pt reporting incr L toe pain today. PT assessed and there is now a small red blister/pimple? On his L great toe. Educated on importance of following up with foot doctor as soon as they leave PT today due to pain getting worse and pt having pain with some standing activity today. Majority of session focused on posture exercises, BLE strengthening, and SLS tasks/foot clearance. Overall, pt tolerated session well. Will continue to progress towards LTGs.     OBJECTIVE IMPAIRMENTS  Abnormal gait, decreased activity tolerance, decreased balance, decreased cognition, decreased coordination, decreased endurance, decreased knowledge of condition, decreased knowledge of use of DME, decreased mobility, difficulty walking, decreased strength, decreased safety awareness, and pain.    ACTIVITY LIMITATIONS carrying, lifting, bending, standing, squatting, sleeping, stairs, transfers, bed mobility, reach over head, and locomotion level   PARTICIPATION LIMITATIONS: meal prep, cleaning,  laundry, medication management, personal finances, interpersonal relationship, driving, shopping, community activity, and yard work   PERSONAL FACTORS Age, Behavior pattern, Fitness, Past/current experiences, Time since onset of injury/illness/exacerbation, Transportation, and 1 comorbidity: Dementia  are also affecting patient's functional outcome.    REHAB POTENTIAL: Fair due to poor prognosis associated w/Lewy Body Dementia    CLINICAL DECISION MAKING: Evolving/moderate complexity   EVALUATION COMPLEXITY: Moderate   PLAN: PT FREQUENCY: 2x/week   PT DURATION: 8 weeks   PLANNED INTERVENTIONS: Therapeutic exercises, Therapeutic activity, Neuromuscular re-education, Balance training, Gait training, Patient/Family education, Self Care, Joint mobilization, Stair training, DME instructions, Manual therapy, and Re-evaluation   PLAN FOR NEXT SESSION: How is pt's L toe? Did they call the foot doctor? Obstacle course w/rollator, eccentric heel taps, sit <>Stands, step length, scifit for large amplitude coordination, turns, lateral stepping. Exercises for posture.     Janann August, PT, DPT 10/10/21 9:43 AM

## 2021-10-15 ENCOUNTER — Ambulatory Visit: Payer: Medicare HMO | Admitting: Physical Therapy

## 2021-10-15 ENCOUNTER — Encounter: Payer: Self-pay | Admitting: Podiatry

## 2021-10-15 ENCOUNTER — Ambulatory Visit: Payer: Medicare HMO | Admitting: Podiatry

## 2021-10-15 DIAGNOSIS — M79675 Pain in left toe(s): Secondary | ICD-10-CM

## 2021-10-15 DIAGNOSIS — M6281 Muscle weakness (generalized): Secondary | ICD-10-CM | POA: Diagnosis not present

## 2021-10-15 DIAGNOSIS — L600 Ingrowing nail: Secondary | ICD-10-CM

## 2021-10-15 DIAGNOSIS — B351 Tinea unguium: Secondary | ICD-10-CM | POA: Diagnosis not present

## 2021-10-15 DIAGNOSIS — R2681 Unsteadiness on feet: Secondary | ICD-10-CM

## 2021-10-15 DIAGNOSIS — M79674 Pain in right toe(s): Secondary | ICD-10-CM

## 2021-10-15 DIAGNOSIS — R2689 Other abnormalities of gait and mobility: Secondary | ICD-10-CM | POA: Diagnosis not present

## 2021-10-15 NOTE — Therapy (Signed)
OUTPATIENT PHYSICAL THERAPY TREATMENT NOTE   Patient Name: Joe Murray MRN: [id number] DOB:11/05/1940, 81 y.o., male Today's Date: 10/15/2021  PCP: Alysia Penna, MD REFERRING PROVIDER: Alysia Penna, MD   END OF SESSION:   PT End of Session - 10/15/21 0851     Visit Number 12    Number of Visits 17    Date for PT Re-Evaluation 11/06/21    Authorization Type [employer]    Progress Note Due on Visit 10    PT Start Time 0848    PT Stop Time 0930    PT Time Calculation (min) 42 min    Equipment Utilized During Treatment Gait belt    Activity Tolerance Patient tolerated treatment well    Behavior During Therapy [facility] for tasks assessed/performed                   Past Medical History:  Diagnosis Date   Alzheimer's dementia [facility])    sees Dr. Ellouise Newer   CAD S/P percutaneous coronary angioplasty 10/'03; 3/'09; 7/*09; 1/'17    a. Dr. Ellyn Hack; '03 - Cypher DES 3.0 mm 33 mm proximal-mid LAD (details D1 with ostial 60%); 3/'09 dRCA 2.75 mm x 13 mm Cypher DES (2.8 mm); 7/'09 pRCA 3.0 mm x 18 m Cypher DES (3.25 mm);; (2009) 2D Echo - EF 55%, (November 2014) nonischemic Myoview;; b. Promus DES to RCA and mid LAD 02/26/2015   Chronotropic incompetence 12/2011   ETT 11/2017: Normal blood pressure response.  No EKG changes.  Exercise stopped due to fatigue and dyspnea.  Heart rate increased to low 90s from 50s.  Suggest chronotropic incompetence.  Severely impaired exercise capacity. -->  Results reviewed with electrophysiology: Still not indication for pacemaker.   Diabetes mellitus type 2 in obese [facility])    CAD   Diverticulosis    Dyslipidemia, goal LDL below 70    on [employer] study medicaion.   ED (erectile dysfunction)    GERD (gastroesophageal reflux disease)    Hemorrhoids 07/2002   Internal and External   Insomnia    Nodular basal cell carcinoma (BCC) 05/08/2019   left ear (cx30fu)   Non-STEMI (non-ST elevated myocardial infarction) ([place]) 11/2001   a. Proximal  LAD tandem lesions -- long DES stent covering both;; b. Jan 2017: mRCA PCI, mLAD PCI   Parkinson's disease [facility])    sees Dr. Ellouise Newer   Persistent sinus bradycardia    Personal history of colonic adenomas 09/06/2012   Psoriasis    Resting HR in 54s   SCC (squamous cell carcinoma) 11/13/2001   right upper outer arm (txpbx)   SCC (squamous cell carcinoma) 11/13/2009   right upper ear rim (cx3 29fu)   SCC (squamous cell carcinoma) 01/20/2010   right nose (cx2fu)   SCC (squamous cell carcinoma) 03/28/2014   right side of nose ( cx73fu)   SCC (squamous cell carcinoma) 06/03/2016   right side of nose   SCC (squamous cell carcinoma) 05/08/2019   right ear inferior (cx49fu) cis   SCCA (squamous cell carcinoma) of skin 04/23/2021   Right Temporal Scalp (well diff) (tx p bx)   Spasm of esophagus    Squamous cell carcinoma in situ (SCCIS) 05/08/2019   Right Ear Inferior   Squamous cell carcinoma of skin 11/08/2001   top right rim ear    SVT (supraventricular tachycardia) ([place]) 2003   Vertigo, benign positional    Past Surgical History:  Procedure Laterality Date   CARDIAC CATHETERIZATION N/A 02/26/2015  Procedure: Left Heart Cath and Coronary Angiography;  Surgeon: Leonie Man, MD: mRCA 99% --> PCI. mLAD 75%-> FFR Guided PCI, Mod ISR in pLAD & pRCA   CARDIAC CATHETERIZATION N/A 02/26/2015   Procedure: Coronary Stent Intervention;  Surgeon: Leonie Man, MD;  Location: [facility] INVASIVE CV LAB: mRCA Promus Premier DES 3.0 x 12 (3.5), mLAD Promus Premier DES 2.75 x 20 (3.0)   CARDIAC CATHETERIZATION N/A 02/26/2015   Procedure: Intravascular Pressure Wire/FFR Study;  Surgeon: Leonie Man, MD;  Location: [place] CV LAB;  Service: Cardiovascular: LAD 75% - FFR + -> PCI   CATARACT EXTRACTION  5701,[id number]   COLONOSCOPY  12/16/2017   per Dr. Carlean Purl, adenomatous polyps, no repeats due to age    [place]  LAD - 2003, RCA 3 & 7/ '09   Cypher 3.0 x 32 mid  LAD; Cyper 2.75 x 13 - distal RCA, 3.0 x 18 Prox RCA   ESOPHAGOGASTRODUODENOSCOPY  08-11-02   esophageal dilation per Dr. Carlean Purl   HERNIA REPAIR     right inguinal    NM MYOVIEW LTD  Nov 2014   ~8 METS; EF 60%, no ischemia or infarction   PERCUTANEOUS CORONARY STENT INTERVENTION (PCI-S)  11/23/2001   NSTEMI: Prox-Mid LAD tandem ~80% lesions on either side of D1 (with 80% lesion) -- Cypher DES 3.0 mm x 33 m (covering both lesions)    PERCUTANEOUS CORONARY STENT INTERVENTION (PCI-S)  04/27/2007   Unstable Angina: Distal RCA 95%: Cypher 2.75x13  (2.8 mm); residual focal ~60%ISR in LAD stent, 60% D1 ostial (no PTCA on LAD due to no ischemia on ST)   PERCUTANEOUS CORONARY STENT INTERVENTION (PCI-S)  09/21/2007   Bradycardia & Unstable Angina: Prox RCA 70% - PCI Cypher DES 3.0 mm x 18 mm  (3.25 mm); ostial 60-70% jailed D1. LAD & distal RCA stents patent   SHOULDER SURGERY     left rotator cuff, Dr. Gladstone Lighter   TRANSTHORACIC ECHOCARDIOGRAM  March 2009   Normal LV size and function, EF 55%. Mild MR; mild RV dilation.   TRANSTHORACIC ECHOCARDIOGRAM  02/2015   EF 60-65%. Moderate concentric LVH. Normal function with normal regional wall motion. GR 1 DD   WRIST FRACTURE SURGERY     right   Patient Active Problem List   Diagnosis Date Noted   Myalgia due to statin 01/27/2021   Parkinson's disease ([place]) 11/07/2020   Alzheimer's dementia ([place]) 11/07/2020   Intermittent claudication ([place]) 12/01/2017   Trochanteric bursitis of right hip 09/04/2017   Fuchs' corneal dystrophy 08/14/2016   Essential hypertension -> allowing permissive hypertension.  Medication stopped because of fatigue and orthostatic dizziness. 03/05/2015   NSTEMI (non-ST elevated myocardial infarction) [facility])    Coronary artery disease involving native coronary artery of native heart without angina pectoris    Vitamin D deficiency 07/04/2014   Fatigue 01/08/2014   Obesity (BMI 30-39.9) 01/12/2013   DM (diabetes mellitus), type 2 with  complications - CAD    Dyslipidemia, goal LDL below 70    Exertional shortness of breath 01/12/2012   Chronotropic incompetence 12/25/2011   Vertigo with mild postural lightheadedness. 08/13/2009   ERECTILE DYSFUNCTION, ORGANIC 05/01/2009   GERD 04/18/2008   BPH with urinary obstruction 07/27/2007   CAD S/P percutaneous coronary angioplasty - LADx2, & RCA x 3 07/12/2001    Class: History of    REFERRING DIAG: G30.1,F02.818 (ICD-10-CM) - Alzheimer's dementia, late onset, with behavioral disturbance ([place]) G20 (ICD-10-CM) - Parkinsonism, unspecified Parkinsonism type ([place])  THERAPY DIAG:  Unsteadiness on feet  Muscle weakness (generalized)  Other abnormalities of gait and mobility  Rationale for Evaluation and Treatment Rehabilitation  PERTINENT HISTORY: hypertension, hyperlipidemia, CAD, diabetes. Neuropsychological evaluation in 01/2020 indicated memory storage type problems, executive dysfunction, diminished processing efficiency, likely due to Alzheimer's disease. His parkinsonism suggests there may be a Lewy Body spectrum component.   PRECAUTIONS: fall  SUBJECTIVE: Pt reports he is going to the foot doctor today. Left foot is still bothering him. Did not walk much yesterday because of it. Is doing his exercises occasionally   PAIN:  Are you having pain? Yes: NPRS scale: 5/10 Pain location: L big toe Pain description: Achy *reports that when MD trims toe nails, that helps the pain     TODAY'S TREATMENT:  Ther Ex  SciFit multi-peaks level 6 for 8 minutes using BUE/BLEs for neural priming for reciprocal movement, dynamic cardiovascular warmup and increased amplitude of stepping. Noted pt walked around rollator and swung legs around seat of SciFIt incorrectly despite cues to back up to seat w/rollator. Pt reports he made the same mistake last time. Min cues for full knee extension bilaterally (LLE >RLE). RPE of 8/10 following activity   NMR  In // bars for improved step  length, single leg stability and BLE coordination: -Standing on blue airex w/BUE support and alt cone taps, x15 per side w/min cues for proper foot placement back onto foam. Noted difficulty clearing cone w/LLE, frequently knocking it over. Progressed to double cone taps for increased single leg stance for improved step length, x15 per side. Pt required min cues throughout for proper BLE placement on foam and to maintain alternating sequence   -At rebounder, alt forward step to colored dot w/1KG ball throw/catch for increased step length, UE/LE coordination, high amplitude movement and reactive balance. Mod cues throughout for increased step length to dot on floor and to maintain alternating sequence. Initially had pt perform chest throw and progressed to [facility] throw for postural control and symmetry w/BUE flexion.   Gait Training Gait pattern: step to pattern, step through pattern, decreased arm swing- Right, decreased arm swing- Left, decreased step length- Left, decreased stride length, decreased hip/knee flexion- Right, decreased hip/knee flexion- Left, decreased ankle dorsiflexion- Right, decreased ankle dorsiflexion- Left, Right foot flat, Left foot flat, shuffling, trunk flexed, poor foot clearance- Right, and poor foot clearance- Left Distance walked: Into out of clinic, plus additional distances in clinic.  Assistive device utilized: Environmental consultant - 4 wheeled  Level of assistance: SBA Comments: Cued for safe AD management, especially w/turns,  and increased stride length/foot clearance.      PATIENT EDUCATION: Education details: continue HEP, see Self-Care Person educated: Patient and Spouse Education method: Explanation, Demonstration, and Verbal cues Education comprehension: verbalized understanding and needs further education     HOME EXERCISE PROGRAM: Access Code: [email or web address] URL: [email or web address] Date: 09/10/2021 Prepared by: Estevan Ryder  Exercises - Sit to Stand  with Counter Support  - 1-2 x daily - 7 x weekly - 1 sets - 10 reps - Seated Heel Raise  - 1 x daily - 7 x weekly - 3 sets - 10 reps - Seated Toe Raise  - 1 x daily - 7 x weekly - 3 sets - 10 reps - Seated Scapular Retraction  - 1 x daily - 7 x weekly - 3 sets - 10 reps - Seated March  - 1 x daily - 7 x weekly - 3 sets - 10 reps - Seated Long Arc Quad  -  1 x daily - 7 x weekly - 3 sets - 10 reps     GOALS: Goals reviewed with patient? Yes   SHORT TERM GOALS: Target date: 10/02/2021   Pt will perform initial HEP w/min A from wife for improved strength, balance, transfers and gait.   Baseline: established  Goal status: MET   2.  Pt will improve gait velocity to at least 1.4 ft/s w/LRAD and S* for improved gait efficiency and safety   Baseline: 1.19 ft/s w/SPC and CGA; .85m/s or 2.63ft/s with rollator and supervision  Goal status: MET   3.  TUG to be assessed and STG/LTG written  Baseline: 45.56s with CGA Goal status: MET   4.  Pt will improve 5 x STS to less than or equal to 48 seconds w/BUE support to demonstrate improved functional strength and transfer efficiency.    Baseline: 55.06s w/BUE support; 37.41s with B UE Goal status: MET   5.  Pt will ambulate 150' or greater w/LRAD and S* for improved safety with functional mobility  Baseline: ambulating w/SPC and CGA; supervision with rollator  Goal status: MET  6. Pt will improve TUG to </= 37s secs to demonstrated reduced fall risk  Baseline: 45.56s; 37s with rollator  Goal Status: MET       LONG TERM GOALS: Target date: 10/30/2021   Pt will perform final HEP w/min A from wife for improved strength, balance, transfers and gait. Baseline:  Goal status: INITIAL   2.  Pt will improve TUG to </= 28 secs to demonstrated reduced fall risk  Baseline: 45.56s Goal status: INITIAL   3.  Pt will improve 5 x STS to less than or equal to 41 seconds w/BUE support to demonstrate improved functional strength and transfer efficiency.     Baseline: 55.06s w/BUE support  Goal status: INITIAL   4.  Pt and wife will verbalize and demonstrate fall prevention strategies in the home for reduced fall risk and improved safety  Baseline:  Goal status: INITIAL   5.  Pt will improve gait velocity to at least 56ft/s  w/LRAD and S* for improved gait efficiency and reduced fall risk   Baseline: 1.19 ft/s w/SPC and CGA Goal status: REVISED   6.  Pt will ambulate 300' or greater w/LRAD and S* for improved global endurance and functional mobility independence  Baseline: ambulating w/SPC and CGA Goal status: INITIAL   ASSESSMENT:   CLINICAL IMPRESSION: Emphasis of skilled PT session on increased step length, reactive balance strategies and BLE coordination. Pt continues to have pain in L foot but was decreased today compared to previous session. Pt performs well w/use of visual cues/targets but requires min concurrent cues for proper AD management and to maintain sequence of interventions. Continue POC.     OBJECTIVE IMPAIRMENTS Abnormal gait, decreased activity tolerance, decreased balance, decreased cognition, decreased coordination, decreased endurance, decreased knowledge of condition, decreased knowledge of use of DME, decreased mobility, difficulty walking, decreased strength, decreased safety awareness, and pain.    ACTIVITY LIMITATIONS carrying, lifting, bending, standing, squatting, sleeping, stairs, transfers, bed mobility, reach over head, and locomotion level   PARTICIPATION LIMITATIONS: meal prep, cleaning, laundry, medication management, personal finances, interpersonal relationship, driving, shopping, community activity, and yard work   PERSONAL FACTORS Age, Behavior pattern, Fitness, Past/current experiences, Time since onset of injury/illness/exacerbation, Transportation, and 1 comorbidity: Dementia  are also affecting patient's functional outcome.    REHAB POTENTIAL: Fair due to poor prognosis associated w/Lewy Body  Dementia  CLINICAL DECISION MAKING: Evolving/moderate complexity   EVALUATION COMPLEXITY: Moderate   PLAN: PT FREQUENCY: 2x/week   PT DURATION: 8 weeks   PLANNED INTERVENTIONS: Therapeutic exercises, Therapeutic activity, Neuromuscular re-education, Balance training, Gait training, Patient/Family education, Self Care, Joint mobilization, Stair training, DME instructions, Manual therapy, and Re-evaluation   PLAN FOR NEXT SESSION: How is pt's L toe? Obstacle course w/rollator, eccentric heel taps, sit <>Stands, step length, scifit for large amplitude coordination, turns, lateral stepping. Exercises for posture.     Cruzita Lederer Shakeema Lippman, PT, DPT 10/15/21 9:32 AM

## 2021-10-16 NOTE — Progress Notes (Signed)
Subjective:   Patient ID: Joe Murray, male   DOB: 81 y.o.   MRN: [id number]   HPI Patient presents stating he has been having a lot of pain around his big toe left but it is difficult for him to explain what it is as he does have Parkinson's and does present though with a caregiver.  Also does have nail disease that he cannot take care of for caregiver due to his Parkinson's and thick type nailbeds that become painful   ROS      Objective:  Physical Exam  Neurovascular status was found to be unchanged from previous visits with patient found to have incurvation of the left hallux both medial and lateral borders that seem to be irritated even though he is not a good historian with no indications of gout or other systemic disease that they are concerned about.  All other nailbeds are thick yellow brittle and painful     Assessment:  Probability for ingrown toenail deformity left hallux medial lateral border creating this pain along with mycotic nail infection 1 through 5 right 2 through 5 left     Plan:  H&P reviewed conditions at this time.  At this point I recommended conservative care and I went ahead I anesthetized the left hallux 60 mg like Marcaine mixture sterile prep done to the toe using sterile instrumentation I remove the medial lateral borders and cleaned out the tissue applied sterile dressings and I want to see how this does.  Discussed possibility for permanent procedure in future and then I debrided nailbeds 1-5 right 2-5 left neurogenic bleeding reappoint for [place] to recheck and we will make a decision long-term

## 2021-10-17 ENCOUNTER — Ambulatory Visit: Payer: Medicare HMO

## 2021-10-17 DIAGNOSIS — R2689 Other abnormalities of gait and mobility: Secondary | ICD-10-CM

## 2021-10-17 DIAGNOSIS — M6281 Muscle weakness (generalized): Secondary | ICD-10-CM

## 2021-10-17 DIAGNOSIS — R2681 Unsteadiness on feet: Secondary | ICD-10-CM

## 2021-10-17 NOTE — Therapy (Signed)
OUTPATIENT PHYSICAL THERAPY TREATMENT NOTE   Patient Name: Joe Murray MRN: [id number] DOB:07-31-1940, 81 y.o., male Today's Date: 10/17/2021  PCP: Alysia Penna, MD REFERRING PROVIDER: Alysia Penna, MD   END OF SESSION:   PT End of Session - 10/17/21 0842     Visit Number 13    Number of Visits 17    Date for PT Re-Evaluation 11/06/21    Authorization Type [employer]    Progress Note Due on Visit 20    PT Start Time 0845    PT Stop Time 0930    PT Time Calculation (min) 45 min    Equipment Utilized During Treatment Gait belt    Activity Tolerance Patient tolerated treatment well    Behavior During Therapy [facility] for tasks assessed/performed                   Past Medical History:  Diagnosis Date   Alzheimer's dementia [facility])    sees Dr. Ellouise Newer   CAD S/P percutaneous coronary angioplasty 10/'03; 3/'09; 7/*09; 1/'17    a. Dr. Ellyn Hack; '03 - Cypher DES 3.0 mm 33 mm proximal-mid LAD (details D1 with ostial 60%); 3/'09 dRCA 2.75 mm x 13 mm Cypher DES (2.8 mm); 7/'09 pRCA 3.0 mm x 18 m Cypher DES (3.25 mm);; (2009) 2D Echo - EF 55%, (November 2014) nonischemic Myoview;; b. Promus DES to RCA and mid LAD 02/26/2015   Chronotropic incompetence 12/2011   ETT 11/2017: Normal blood pressure response.  No EKG changes.  Exercise stopped due to fatigue and dyspnea.  Heart rate increased to low 90s from 50s.  Suggest chronotropic incompetence.  Severely impaired exercise capacity. -->  Results reviewed with electrophysiology: Still not indication for pacemaker.   Diabetes mellitus type 2 in obese [facility])    CAD   Diverticulosis    Dyslipidemia, goal LDL below 70    on [employer] study medicaion.   ED (erectile dysfunction)    GERD (gastroesophageal reflux disease)    Hemorrhoids 07/2002   Internal and External   Insomnia    Nodular basal cell carcinoma (BCC) 05/08/2019   left ear (cx49f)   Non-STEMI (non-ST elevated [place] infarction[place]   a. Proximal  LAD tandem lesions -- long DES stent covering both;; b. Jan 2017: mRCA PCI, mLAD PCI   Parkinson's disease ([facility]    sees Dr. KEllouise Newer  Persistent sinus bradycardia    Personal history of colonic adenomas 09/06/2012   Psoriasis    Resting HR in 54s   SCC (squamous cell carcinoma) 11/13/2001   right upper outer arm (txpbx)   SCC (squamous cell carcinoma) 11/13/2009   right upper ear rim (cx3 526f   SCC (squamous cell carcinoma) 01/20/2010   right nose ([id number]  SCC (squamous cell carcinoma) 03/28/2014   right side of nose ( [id number] SCC (squamous cell carcinoma) 06/03/2016   right side of nose   SCC (squamous cell carcinoma) 05/08/2019   right ear inferior ([id number]   SCCA (squamous cell carcinoma) of skin 04/23/2021   Right Temporal Scalp (well diff) (tx p bx)   Spasm of esophagus    Squamous cell carcinoma in situ (SCCIS) 05/08/2019   Right Ear Inferior   Squamous cell carcinoma of skin 11/08/2001   top right rim ear    SVT (supraventricular tachycardia) (HCC) [place]   Vertigo, benign positional    Past Surgical History:  Procedure Laterality Date   CARDIAC CATHETERIZATION N/A 02/26/2015  Procedure: Left Heart Cath and Coronary Angiography;  Surgeon: Leonie Man, MD: mRCA 99% --> PCI. mLAD 75%-> FFR Guided PCI, Mod ISR in pLAD & pRCA   CARDIAC CATHETERIZATION N/A 02/26/2015   Procedure: Coronary Stent Intervention;  Surgeon: Leonie Man, MD;  Location: [facility] INVASIVE CV LAB: mRCA Promus Premier DES 3.0 x 12 (3.5), mLAD Promus Premier DES 2.75 x 20 (3.0)   CARDIAC CATHETERIZATION N/A 02/26/2015   Procedure: Intravascular Pressure Wire/FFR Study;  Surgeon: Leonie Man, MD;  Location: [place] CV LAB;  Service: Cardiovascular: LAD 75% - FFR + -> PCI   CATARACT EXTRACTION  [id number]   COLONOSCOPY  12/16/2017   per Dr. Carlean Purl, adenomatous polyps, no repeats due to age    [place]  LAD - 2003, RCA 3 & 7/ '09   Cypher 3.0 x 32 mid  LAD; Cyper 2.75 x 13 - distal RCA, 3.0 x 18 Prox RCA   ESOPHAGOGASTRODUODENOSCOPY  08-11-02   esophageal dilation per Dr. Carlean Purl   HERNIA REPAIR     right inguinal    NM MYOVIEW LTD  Nov 2014   ~8 METS; EF 60%, no ischemia or infarction   PERCUTANEOUS CORONARY STENT INTERVENTION (PCI-S)  11/23/2001   NSTEMI: Prox-Mid LAD tandem ~80% lesions on either side of D1 (with 80% lesion) -- Cypher DES 3.0 mm x 33 m (covering both lesions)    PERCUTANEOUS CORONARY STENT INTERVENTION (PCI-S)  04/27/2007   Unstable Angina: Distal RCA 95%: Cypher 2.75x13  (2.8 mm); residual focal ~60%ISR in LAD stent, 60% D1 ostial (no PTCA on LAD due to no ischemia on ST)   PERCUTANEOUS CORONARY STENT INTERVENTION (PCI-S)  09/21/2007   Bradycardia & Unstable Angina: Prox RCA 70% - PCI Cypher DES 3.0 mm x 18 mm  (3.25 mm); ostial 60-70% jailed D1. LAD & distal RCA stents patent   SHOULDER SURGERY     left rotator cuff, Dr. Gladstone Lighter   TRANSTHORACIC ECHOCARDIOGRAM  March 2009   Normal LV size and function, EF 55%. Mild MR; mild RV dilation.   TRANSTHORACIC ECHOCARDIOGRAM  02/2015   EF 60-65%. Moderate concentric LVH. Normal function with normal regional wall motion. GR 1 DD   WRIST FRACTURE SURGERY     right   Patient Active Problem List   Diagnosis Date Noted   Myalgia due to statin 01/27/2021   Parkinson's disease ([place]) 11/07/2020   Alzheimer's dementia ([place]) 11/07/2020   Intermittent claudication ([place]) 12/01/2017   Trochanteric bursitis of right hip 09/04/2017   Fuchs' corneal dystrophy 08/14/2016   Essential hypertension -> allowing permissive hypertension.  Medication stopped because of fatigue and orthostatic dizziness. 03/05/2015   NSTEMI (non-ST elevated myocardial infarction) [facility])    Coronary artery disease involving native coronary artery of native heart without angina pectoris    Vitamin D deficiency 07/04/2014   Fatigue 01/08/2014   Obesity (BMI 30-39.9) 01/12/2013   DM (diabetes mellitus), type 2 with  complications - CAD    Dyslipidemia, goal LDL below 70    Exertional shortness of breath 01/12/2012   Chronotropic incompetence 12/25/2011   Vertigo with mild postural lightheadedness. 08/13/2009   ERECTILE DYSFUNCTION, ORGANIC 05/01/2009   GERD 04/18/2008   BPH with urinary obstruction 07/27/2007   CAD S/P percutaneous coronary angioplasty - LADx2, & RCA x 3 07/12/2001    Class: History of    REFERRING DIAG: G30.1,F02.818 (ICD-10-CM) - Alzheimer's dementia, late onset, with behavioral disturbance ([place]) G20 (ICD-10-CM) - Parkinsonism, unspecified Parkinsonism type ([place])  THERAPY DIAG:  Unsteadiness on feet  Muscle weakness (generalized)  Other abnormalities of gait and mobility  Rationale for Evaluation and Treatment Rehabilitation  PERTINENT HISTORY: hypertension, hyperlipidemia, CAD, diabetes. Neuropsychological evaluation in 01/2020 indicated memory storage type problems, executive dysfunction, diminished processing efficiency, likely due to Alzheimer's disease. His parkinsonism suggests there may be a Lewy Body spectrum component.   PRECAUTIONS: fall  SUBJECTIVE: Patient reports doing well. Had his L toe worked on and states it's a little sore today.   PAIN:  Are you having pain? Yes: NPRS scale: 5/10 Pain location: L big toe Pain description: Ach[place]  TODAY'S TREATMENT:  Gait:  -42' x2 with Rollator + supervision, emphasis on large equal step length with increased B foot clearance  -obstacle course: weaving thru cones tight turns, amb over mat, step on/off Airex and turn around cone x4 trials (increased cuing for foot clearance needed when transitioning from surfaces)  -resisted walking 115' x2 with rollator -> resisted+ perturbations with rollator with increased steps needed for LOB posteriorly and to the R.   NMR:  -NuStep level 5 B UE/LE x10 mins for large amplitude reciprocal stepping    PATIENT EDUCATION: Education details: continue HEP Person educated: Patient  and Spouse Education method: Explanation, Demonstration, and Verbal cues Education comprehension: verbalized understanding and needs further education     HOME EXERCISE PROGRAM: Access Code: [email or web address] URL: [email or web address] Date: 09/10/2021 Prepared by: Estevan Ryder  Exercises - Sit to Stand with Counter Support  - 1-2 x daily - 7 x weekly - 1 sets - 10 reps - Seated Heel Raise  - 1 x daily - 7 x weekly - 3 sets - 10 reps - Seated Toe Raise  - 1 x daily - 7 x weekly - 3 sets - 10 reps - Seated Scapular Retraction  - 1 x daily - 7 x weekly - 3 sets - 10 reps - Seated March  - 1 x daily - 7 x weekly - 3 sets - 10 reps - Seated Long Arc Quad  - 1 x daily - 7 x weekly - 3 sets - 10 reps     GOALS: Goals reviewed with patient? Yes   SHORT TERM GOALS: Target date: 10/02/2021   Pt will perform initial HEP w/min A from wife for improved strength, balance, transfers and gait.   Baseline: established  Goal status: MET   2.  Pt will improve gait velocity to at least 1.4 ft/s w/LRAD and S* for improved gait efficiency and safety   Baseline: 1.19 ft/s w/SPC and CGA; .32ms or 2.356fs with rollator and supervision  Goal status: MET   3.  TUG to be assessed and STG/LTG written  Baseline: 45.56s with CGA Goal status: MET   4.  Pt will improve 5 x STS to less than or equal to 48 seconds w/BUE support to demonstrate improved functional strength and transfer efficiency.    Baseline: 55.06s w/BUE support; 37.41s with B UE Goal status: MET   5.  Pt will ambulate 150' or greater w/LRAD and S* for improved safety with functional mobility  Baseline: ambulating w/SPC and CGA; supervision with rollator  Goal status: MET  6. Pt will improve TUG to </= 37s secs to demonstrated reduced fall risk  Baseline: 45.56s; 37s with rollator  Goal Status: MET       LONG TERM GOALS: Target date: 10/30/2021   Pt will perform final HEP w/min A from wife for improved strength, balance,  transfers and gait. Baseline:  Goal status: INITIAL   2.  Pt will improve TUG to </= 28 secs to demonstrated reduced fall risk  Baseline: 45.56s Goal status: INITIAL   3.  Pt will improve 5 x STS to less than or equal to 41 seconds w/BUE support to demonstrate improved functional strength and transfer efficiency.    Baseline: 55.06s w/BUE support  Goal status: INITIAL   4.  Pt and wife will verbalize and demonstrate fall prevention strategies in the home for reduced fall risk and improved safety  Baseline:  Goal status: INITIAL   5.  Pt will improve gait velocity to at least 40f/s  w/LRAD and S* for improved gait efficiency and reduced fall risk   Baseline: 1.19 ft/s w/SPC and CGA Goal status: REVISED   6.  Pt will ambulate 300' or greater w/LRAD and S* for improved global endurance and functional mobility independence  Baseline: ambulating w/SPC and CGA Goal status: INITIAL   ASSESSMENT:   CLINICAL IMPRESSION: Patient seen for skilled PT session with emphasis on ambulatory balance retraining with rollator. Patient noted to have fair carryover from previous sessions regarding sequence for sit <> stand with rollator. He does demonstrate difficulty transitioning from one surface to the other with modifying his foot clearance as needed, especially on compliant surfaces. No evidence of freezing or festinating gait, though. Fair balance strategies with rollator noted given various perturbations. He does require multiple steps to regain his balance, but is able to do so independently. Continue POC.     OBJECTIVE IMPAIRMENTS Abnormal gait, decreased activity tolerance, decreased balance, decreased cognition, decreased coordination, decreased endurance, decreased knowledge of condition, decreased knowledge of use of DME, decreased mobility, difficulty walking, decreased strength, decreased safety awareness, and pain.    ACTIVITY LIMITATIONS carrying, lifting, bending, standing, squatting,  sleeping, stairs, transfers, bed mobility, reach over head, and locomotion level   PARTICIPATION LIMITATIONS: meal prep, cleaning, laundry, medication management, personal finances, interpersonal relationship, driving, shopping, community activity, and yard work   PERSONAL FACTORS Age, Behavior pattern, Fitness, Past/current experiences, Time since onset of injury/illness/exacerbation, Transportation, and 1 comorbidity: Dementia  are also affecting patient's functional outcome.    REHAB POTENTIAL: Fair due to poor prognosis associated w/Lewy Body Dementia    CLINICAL DECISION MAKING: Evolving/moderate complexity   EVALUATION COMPLEXITY: Moderate   PLAN: PT FREQUENCY: 2x/week   PT DURATION: 8 weeks   PLANNED INTERVENTIONS: Therapeutic exercises, Therapeutic activity, Neuromuscular re-education, Balance training, Gait training, Patient/Family education, Self Care, Joint mobilization, Stair training, DME instructions, Manual therapy, and Re-evaluation   PLAN FOR NEXT SESSION: How is pt's L toe? Obstacle course w/rollator, eccentric heel taps, sit <>Stands, step length, scifit for large amplitude coordination, turns, lateral stepping. Exercises for posture, gait over compliant surfaces    JDebbora Dus PT, DPT, CBIS  10/17/21 9:41 AM

## 2021-10-22 ENCOUNTER — Ambulatory Visit: Payer: Medicare HMO | Admitting: Podiatry

## 2021-10-22 ENCOUNTER — Encounter: Payer: Self-pay | Admitting: Physical Therapy

## 2021-10-22 ENCOUNTER — Ambulatory Visit: Payer: Medicare HMO | Admitting: Physical Therapy

## 2021-10-22 DIAGNOSIS — M6281 Muscle weakness (generalized): Secondary | ICD-10-CM

## 2021-10-22 DIAGNOSIS — R2681 Unsteadiness on feet: Secondary | ICD-10-CM | POA: Diagnosis not present

## 2021-10-22 DIAGNOSIS — R2689 Other abnormalities of gait and mobility: Secondary | ICD-10-CM | POA: Diagnosis not present

## 2021-10-22 NOTE — Therapy (Signed)
OUTPATIENT PHYSICAL THERAPY TREATMENT NOTE   Patient Name: Joe Murray MRN: [id number] DOB:January 10, 1941, 81 y.o., male Today's Date: 10/22/2021  PCP: Gershon Crane, MD REFERRING PROVIDER: Gershon Crane, MD   END OF SESSION:   PT End of Session - 10/22/21 0850     Visit Number 14    Number of Visits 17    Date for PT Re-Evaluation 11/06/21    Authorization Type [employer]    Progress Note Due on Visit 20    PT Start Time 0847    PT Stop Time 0928    PT Time Calculation (min) 41 min    Equipment Utilized During Treatment Gait belt    Activity Tolerance Patient tolerated treatment well    Behavior During Therapy [facility] for tasks assessed/performed                   Past Medical History:  Diagnosis Date   Alzheimer's dementia [facility]    sees Dr. Patrcia Dolly   CAD S/P percutaneous coronary angioplasty 10/'03; 3/'09; 7/*09; 1/'17    a. Dr. Herbie [place]; '03 - Cypher DES 3.0 mm 33 mm proximal-mid LAD (details D1 with ostial 60%); 3/'09 dRCA 2.75 mm x 13 mm Cypher DES (2.8 mm); 7/'09 pRCA 3.0 mm x 18 m Cypher DES (3.25 mm);; (2009) 2D Echo - EF 55%, (November 2014) nonischemic Myoview;; b. Promus DES to RCA and mid LAD 02/26/2015   Chronotropic incompetence 12/2011   ETT 11/2017: Normal blood pressure response.  No EKG changes.  Exercise stopped due to fatigue and dyspnea.  Heart rate increased to low 90s from 50s.  Suggest chronotropic incompetence.  Severely impaired exercise capacity. -->  Results reviewed with electrophysiology: Still not indication for pacemaker.   Diabetes mellitus type 2 in obese [facility])    CAD   Diverticulosis    Dyslipidemia, goal LDL below 70    on [employer] study medicaion.   ED (erectile dysfunction)    GERD (gastroesophageal reflux disease)    Hemorrhoids 07/2002   Internal and External   Insomnia    Nodular basal cell carcinoma (BCC) 05/08/2019   left ear (cx99fu)   Non-STEMI (non-ST elevated myocardial infarction) (HCC) 11/2001   a. Proximal  LAD tandem lesions -- long DES stent covering both;; b. Jan 2017: mRCA PCI, mLAD PCI   Parkinson's disease [facility])    sees Dr. Patrcia Dolly   Persistent sinus bradycardia    Personal history of colonic adenomas 09/06/2012   Psoriasis    Resting HR in 54s   SCC (squamous cell carcinoma) 11/13/2001   right upper outer arm (txpbx)   SCC (squamous cell carcinoma) 11/13/2009   right upper ear rim (cx3 54fu)   SCC (squamous cell carcinoma) 01/20/2010   right nose (cx55fu)   SCC (squamous cell carcinoma) 03/28/2014   right side of nose ( cx65fu)   SCC (squamous cell carcinoma) 06/03/2016   right side of nose   SCC (squamous cell carcinoma) 05/08/2019   right ear inferior (cx40fu) cis   SCCA (squamous cell carcinoma) of skin 04/23/2021   Right Temporal Scalp (well diff) (tx p bx)   Spasm of esophagus    Squamous cell carcinoma in situ (SCCIS) 05/08/2019   Right Ear Inferior   Squamous cell carcinoma of skin 11/08/2001   top right rim ear    SVT (supraventricular tachycardia) (HCC) 2003   Vertigo, benign positional    Past Surgical History:  Procedure Laterality Date   CARDIAC CATHETERIZATION N/A 02/26/2015  Procedure: Left Heart Cath and Coronary Angiography;  Surgeon: Leonie Man, MD: mRCA 99% --> PCI. mLAD 75%-> FFR Guided PCI, Mod ISR in pLAD & pRCA   CARDIAC CATHETERIZATION N/A 02/26/2015   Procedure: Coronary Stent Intervention;  Surgeon: Leonie Man, MD;  Location: [facility] INVASIVE CV LAB: mRCA Promus Premier DES 3.0 x 12 (3.5), mLAD Promus Premier DES 2.75 x 20 (3.0)   CARDIAC CATHETERIZATION N/A 02/26/2015   Procedure: Intravascular Pressure Wire/FFR Study;  Surgeon: Leonie Man, MD;  Location: [place] CV LAB;  Service: Cardiovascular: LAD 75% - FFR + -> PCI   CATARACT EXTRACTION  [id number]   COLONOSCOPY  12/16/2017   per Dr. Carlean Purl, adenomatous polyps, no repeats due to age    [place]  LAD - 2003, RCA 3 & 7/ '09   Cypher 3.0 x 32 mid  LAD; Cyper 2.75 x 13 - distal RCA, 3.0 x 18 Prox RCA   ESOPHAGOGASTRODUODENOSCOPY  08-11-02   esophageal dilation per Dr. Carlean Purl   HERNIA REPAIR     right inguinal    NM MYOVIEW LTD  Nov 2014   ~8 METS; EF 60%, no ischemia or infarction   PERCUTANEOUS CORONARY STENT INTERVENTION (PCI-S)  11/23/2001   NSTEMI: Prox-Mid LAD tandem ~80% lesions on either side of D1 (with 80% lesion) -- Cypher DES 3.0 mm x 33 m (covering both lesions)    PERCUTANEOUS CORONARY STENT INTERVENTION (PCI-S)  04/27/2007   Unstable Angina: Distal RCA 95%: Cypher 2.75x13  (2.8 mm); residual focal ~60%ISR in LAD stent, 60% D1 ostial (no PTCA on LAD due to no ischemia on ST)   PERCUTANEOUS CORONARY STENT INTERVENTION (PCI-S)  09/21/2007   Bradycardia & Unstable Angina: Prox RCA 70% - PCI Cypher DES 3.0 mm x 18 mm  (3.25 mm); ostial 60-70% jailed D1. LAD & distal RCA stents patent   SHOULDER SURGERY     left rotator cuff, Dr. Gladstone Lighter   TRANSTHORACIC ECHOCARDIOGRAM  March 2009   Normal LV size and function, EF 55%. Mild MR; mild RV dilation.   TRANSTHORACIC ECHOCARDIOGRAM  02/2015   EF 60-65%. Moderate concentric LVH. Normal function with normal regional wall motion. GR 1 DD   WRIST FRACTURE SURGERY     right   Patient Active Problem List   Diagnosis Date Noted   Myalgia due to statin 01/27/2021   Parkinson's disease ([place]) 11/07/2020   Alzheimer's dementia ([place]) 11/07/2020   Intermittent claudication ([place]) 12/01/2017   Trochanteric bursitis of right hip 09/04/2017   Fuchs' corneal dystrophy 08/14/2016   Essential hypertension -> allowing permissive hypertension.  Medication stopped because of fatigue and orthostatic dizziness. 03/05/2015   NSTEMI (non-ST elevated myocardial infarction) [facility])    Coronary artery disease involving native coronary artery of native heart without angina pectoris    Vitamin D deficiency 07/04/2014   Fatigue 01/08/2014   Obesity (BMI 30-39.9) 01/12/2013   DM (diabetes mellitus), type 2 with  complications - CAD    Dyslipidemia, goal LDL below 70    Exertional shortness of breath 01/12/2012   Chronotropic incompetence 12/25/2011   Vertigo with mild postural lightheadedness. 08/13/2009   ERECTILE DYSFUNCTION, ORGANIC 05/01/2009   GERD 04/18/2008   BPH with urinary obstruction 07/27/2007   CAD S/P percutaneous coronary angioplasty - LADx2, & RCA x 3 07/12/2001    Class: History of    REFERRING DIAG: G30.1,F02.818 (ICD-10-CM) - Alzheimer's dementia, late onset, with behavioral disturbance ([place]) G20 (ICD-10-CM) - Parkinsonism, unspecified Parkinsonism type ([place])  THERAPY DIAG:  Unsteadiness on feet  Muscle weakness (generalized)  Other abnormalities of gait and mobility  Rationale for Evaluation and Treatment Rehabilitation  PERTINENT HISTORY: hypertension, hyperlipidemia, CAD, diabetes. Neuropsychological evaluation in 01/2020 indicated memory storage type problems, executive dysfunction, diminished processing efficiency, likely due to Alzheimer's disease. His parkinsonism suggests there may be a Lewy Body spectrum component.   PRECAUTIONS: fall  SUBJECTIVE: No falls, reports L toe is feeling better.   PAIN:  Are you having pain? No  TODAY'S TREATMENT:  Therapeutic Exercise: SciFit with BUE/BLEs at gear 4 for 7 minutes for activity tolerance/endurance, ROM, and reciprocal movement patterns. Cues for increased knee extension of LLE.   Pt needing cues to fully turn with rollator before sitting down on SciFit chair and locking brakes. When going to perform a sit > stand, cued for proper BUE position (as pt initially trying to pull up with BUE on handles). Cued to scoot out towards edge and incr forward lean to stand. Took a couple of tries, but pt able to perform with incr forward lean.   NMR:  Wide BOS trunk rotations on air ex x15 reps each side reaching outside of BOS in multi-directions and tapping cone (held by another PT), cues to reset in the middle with tall  posture. Intermittent UE support for balance, more challenged twisting to L side.  Posterior stepping strategy on and off air ex, x12 reps each side, beginning with UE support > none with cued for larger step length posteriorly and incr step length when stepping back on.  With resisted belt and 2nd PT providing resistance, alternating forward steps to floor dot as visual cue x15 reps each side with cues for larger amplitude movements and larger step length, then progressed to 2nd PT providing random perturbations and pt having to regain his balance with a stepping strategy. Min guard at times for balance and pt needing to use bars at times. Performed lateral side stepping x15 reps each side against steady resistance and stepping to floor dot and over yardstick for incr foot clearance and progressing to random perturbations. Pt did well maintaining his balance in the lateral direction.     Gait during session with rollator with cues throughout for incr stride length and foot clearance. Noted pt having some freezing when going to turn with rollator to sit down on mat table.   PATIENT EDUCATION: Education details: continue HEP Person educated: Patient and Spouse Education method: Explanation, Demonstration, and Verbal cues Education comprehension: verbalized understanding      HOME EXERCISE PROGRAM: Access Code: [email or web address] URL: [email or web address] Date: 09/10/2021 Prepared by: Estevan Ryder  Exercises - Sit to Stand with Counter Support  - 1-2 x daily - 7 x weekly - 1 sets - 10 reps - Seated Heel Raise  - 1 x daily - 7 x weekly - 3 sets - 10 reps - Seated Toe Raise  - 1 x daily - 7 x weekly - 3 sets - 10 reps - Seated Scapular Retraction  - 1 x daily - 7 x weekly - 3 sets - 10 reps - Seated March  - 1 x daily - 7 x weekly - 3 sets - 10 reps - Seated Long Arc Quad  - 1 x daily - 7 x weekly - 3 sets - 10 reps     GOALS: Goals reviewed with patient? Yes   SHORT TERM GOALS:  Target date: 10/02/2021   Pt will perform initial HEP w/min A from wife for improved strength,  balance, transfers and gait.   Baseline: established  Goal status: MET   2.  Pt will improve gait velocity to at least 1.4 ft/s w/LRAD and S* for improved gait efficiency and safety   Baseline: 1.19 ft/s w/SPC and CGA; .23m/s or 2.32ft/s with rollator and supervision  Goal status: MET   3.  TUG to be assessed and STG/LTG written  Baseline: 45.56s with CGA Goal status: MET   4.  Pt will improve 5 x STS to less than or equal to 48 seconds w/BUE support to demonstrate improved functional strength and transfer efficiency.    Baseline: 55.06s w/BUE support; 37.41s with B UE Goal status: MET   5.  Pt will ambulate 150' or greater w/LRAD and S* for improved safety with functional mobility  Baseline: ambulating w/SPC and CGA; supervision with rollator  Goal status: MET  6. Pt will improve TUG to </= 37s secs to demonstrated reduced fall risk  Baseline: 45.56s; 37s with rollator  Goal Status: MET       LONG TERM GOALS: Target date: 10/30/2021   Pt will perform final HEP w/min A from wife for improved strength, balance, transfers and gait. Baseline:  Goal status: INITIAL   2.  Pt will improve TUG to </= 28 secs to demonstrated reduced fall risk  Baseline: 45.56s Goal status: INITIAL   3.  Pt will improve 5 x STS to less than or equal to 41 seconds w/BUE support to demonstrate improved functional strength and transfer efficiency.    Baseline: 55.06s w/BUE support  Goal status: INITIAL   4.  Pt and wife will verbalize and demonstrate fall prevention strategies in the home for reduced fall risk and improved safety  Baseline:  Goal status: INITIAL   5.  Pt will improve gait velocity to at least 6ft/s  w/LRAD and S* for improved gait efficiency and reduced fall risk   Baseline: 1.19 ft/s w/SPC and CGA Goal status: REVISED   6.  Pt will ambulate 300' or greater w/LRAD and S* for  improved global endurance and functional mobility independence  Baseline: ambulating w/SPC and CGA Goal status: INITIAL   ASSESSMENT:   CLINICAL IMPRESSION: Today's skilled session focused on BLE strengthening and balance strategies with focus on stepping, trunk rotations, and improved step length. Pt did well with maintaining his balance with stepping strategies against a steady resistance, esp in the lateral direction. Pt continues to need cues for incr stride length/foot clearance throughout as without cues pt reverts back to a shuffling pattern. Pt did demonstrate some freezing today with gait when going to turn to sit on mat table. Pt tolerated session well, will continue per POC.      OBJECTIVE IMPAIRMENTS Abnormal gait, decreased activity tolerance, decreased balance, decreased cognition, decreased coordination, decreased endurance, decreased knowledge of condition, decreased knowledge of use of DME, decreased mobility, difficulty walking, decreased strength, decreased safety awareness, and pain.    ACTIVITY LIMITATIONS carrying, lifting, bending, standing, squatting, sleeping, stairs, transfers, bed mobility, reach over head, and locomotion level   PARTICIPATION LIMITATIONS: meal prep, cleaning, laundry, medication management, personal finances, interpersonal relationship, driving, shopping, community activity, and yard work   PERSONAL FACTORS Age, Behavior pattern, Fitness, Past/current experiences, Time since onset of injury/illness/exacerbation, Transportation, and 1 comorbidity: Dementia  are also affecting patient's functional outcome.    REHAB POTENTIAL: Fair due to poor prognosis associated w/Lewy Body Dementia    CLINICAL DECISION MAKING: Evolving/moderate complexity   EVALUATION COMPLEXITY: Moderate   PLAN: PT FREQUENCY:  2x/week   PT DURATION: 8 weeks   PLANNED INTERVENTIONS: Therapeutic exercises, Therapeutic activity, Neuromuscular re-education, Balance training, Gait  training, Patient/Family education, Self Care, Joint mobilization, Stair training, DME instructions, Manual therapy, and Re-evaluation   PLAN FOR NEXT SESSION: Add some standing balance to HEP. Obstacle course w/rollator, eccentric heel taps, sit <>Stands, step length, scifit for large amplitude coordination, turns, lateral stepping. Exercises for posture, gait over compliant surfaces  Janann August, PT, DPT 10/22/21 9:31 AM

## 2021-10-24 ENCOUNTER — Ambulatory Visit: Payer: Medicare HMO | Attending: Neurology | Admitting: Physical Therapy

## 2021-10-24 DIAGNOSIS — R2681 Unsteadiness on feet: Secondary | ICD-10-CM | POA: Diagnosis not present

## 2021-10-24 DIAGNOSIS — M6281 Muscle weakness (generalized): Secondary | ICD-10-CM | POA: Insufficient documentation

## 2021-10-24 DIAGNOSIS — R2689 Other abnormalities of gait and mobility: Secondary | ICD-10-CM | POA: Diagnosis not present

## 2021-10-24 NOTE — Therapy (Signed)
OUTPATIENT PHYSICAL THERAPY TREATMENT NOTE   Patient Name: Joe Murray MRN: [id number] DOB:27-Apr-1940, 81 y.o., male Today's Date: 10/24/2021  PCP: Gershon Crane, MD REFERRING PROVIDER: Gershon Crane, MD   END OF SESSION:   PT End of Session - 10/24/21 0850     Visit Number 15    Number of Visits 17    Date for PT Re-Evaluation 11/06/21    Authorization Type [employer]    Progress Note Due on Visit 20    PT Start Time 0847    PT Stop Time 0926    PT Time Calculation (min) 39 min    Equipment Utilized During Treatment --    Activity Tolerance Patient tolerated treatment well    Behavior During Therapy [facility] for tasks assessed/performed                    Past Medical History:  Diagnosis Date   Alzheimer's dementia [facility])    sees Dr. Patrcia Dolly   CAD S/P percutaneous coronary angioplasty 10/'03; 3/'09; 7/*09; 1/'17    a. Dr. Herbie [place]; '03 - Cypher DES 3.0 mm 33 mm proximal-mid LAD (details D1 with ostial 60%); 3/'09 dRCA 2.75 mm x 13 mm Cypher DES (2.8 mm); 7/'09 pRCA 3.0 mm x 18 m Cypher DES (3.25 mm);; (2009) 2D Echo - EF 55%, (November 2014) nonischemic Myoview;; b. Promus DES to RCA and mid LAD 02/26/2015   Chronotropic incompetence 12/2011   ETT 11/2017: Normal blood pressure response.  No EKG changes.  Exercise stopped due to fatigue and dyspnea.  Heart rate increased to low 90s from 50s.  Suggest chronotropic incompetence.  Severely impaired exercise capacity. -->  Results reviewed with electrophysiology: Still not indication for pacemaker.   Diabetes mellitus type 2 in obese [facility])    CAD   Diverticulosis    Dyslipidemia, goal LDL below 70    on [employer] study medicaion.   ED (erectile dysfunction)    GERD (gastroesophageal reflux disease)    Hemorrhoids 07/2002   Internal and External   Insomnia    Nodular basal cell carcinoma (BCC) 05/08/2019   left ear (cx29fu)   Non-STEMI (non-ST elevated myocardial infarction) (HCC) 11/2001   a. Proximal LAD  tandem lesions -- long DES stent covering both;; b. Jan 2017: mRCA PCI, mLAD PCI   Parkinson's disease [facility])    sees Dr. Patrcia Dolly   Persistent sinus bradycardia    Personal history of colonic adenomas 09/06/2012   Psoriasis    Resting HR in 54s   SCC (squamous cell carcinoma) 11/13/2001   right upper outer arm (txpbx)   SCC (squamous cell carcinoma) 11/13/2009   right upper ear rim (cx3 55fu)   SCC (squamous cell carcinoma) 01/20/2010   right nose (cx6fu)   SCC (squamous cell carcinoma) 03/28/2014   right side of nose ( cx104fu)   SCC (squamous cell carcinoma) 06/03/2016   right side of nose   SCC (squamous cell carcinoma) 05/08/2019   right ear inferior (cx59fu) cis   SCCA (squamous cell carcinoma) of skin 04/23/2021   Right Temporal Scalp (well diff) (tx p bx)   Spasm of esophagus    Squamous cell carcinoma in situ (SCCIS) 05/08/2019   Right Ear Inferior   Squamous cell carcinoma of skin 11/08/2001   top right rim ear    SVT (supraventricular tachycardia) (HCC) 2003   Vertigo, benign positional    Past Surgical History:  Procedure Laterality Date   CARDIAC CATHETERIZATION N/A 02/26/2015  Procedure: Left Heart Cath and Coronary Angiography;  Surgeon: Leonie Man, MD: mRCA 99% --> PCI. mLAD 75%-> FFR Guided PCI, Mod ISR in pLAD & pRCA   CARDIAC CATHETERIZATION N/A 02/26/2015   Procedure: Coronary Stent Intervention;  Surgeon: Leonie Man, MD;  Location: [facility] INVASIVE CV LAB: mRCA Promus Premier DES 3.0 x 12 (3.5), mLAD Promus Premier DES 2.75 x 20 (3.0)   CARDIAC CATHETERIZATION N/A 02/26/2015   Procedure: Intravascular Pressure Wire/FFR Study;  Surgeon: Leonie Man, MD;  Location: [place] CV LAB;  Service: Cardiovascular: LAD 75% - FFR + -> PCI   CATARACT EXTRACTION  0263,7858   COLONOSCOPY  12/16/2017   per Dr. Carlean Purl, adenomatous polyps, no repeats due to age    [place]  LAD - 2003, RCA 3 & 7/ '09   Cypher 3.0 x 32 mid LAD;  Cyper 2.75 x 13 - distal RCA, 3.0 x 18 Prox RCA   ESOPHAGOGASTRODUODENOSCOPY  08-11-02   esophageal dilation per Dr. Carlean Purl   HERNIA REPAIR     right inguinal    NM MYOVIEW LTD  Nov 2014   ~8 METS; EF 60%, no ischemia or infarction   PERCUTANEOUS CORONARY STENT INTERVENTION (PCI-S)  11/23/2001   NSTEMI: Prox-Mid LAD tandem ~80% lesions on either side of D1 (with 80% lesion) -- Cypher DES 3.0 mm x 33 m (covering both lesions)    PERCUTANEOUS CORONARY STENT INTERVENTION (PCI-S)  04/27/2007   Unstable Angina: Distal RCA 95%: Cypher 2.75x13  (2.8 mm); residual focal ~60%ISR in LAD stent, 60% D1 ostial (no PTCA on LAD due to no ischemia on ST)   PERCUTANEOUS CORONARY STENT INTERVENTION (PCI-S)  09/21/2007   Bradycardia & Unstable Angina: Prox RCA 70% - PCI Cypher DES 3.0 mm x 18 mm  (3.25 mm); ostial 60-70% jailed D1. LAD & distal RCA stents patent   SHOULDER SURGERY     left rotator cuff, Dr. Gladstone Lighter   TRANSTHORACIC ECHOCARDIOGRAM  March 2009   Normal LV size and function, EF 55%. Mild MR; mild RV dilation.   TRANSTHORACIC ECHOCARDIOGRAM  02/2015   EF 60-65%. Moderate concentric LVH. Normal function with normal regional wall motion. GR 1 DD   WRIST FRACTURE SURGERY     right   Patient Active Problem List   Diagnosis Date Noted   Myalgia due to statin 01/27/2021   Parkinson's disease ([place]) 11/07/2020   Alzheimer's dementia ([place]) 11/07/2020   Intermittent claudication ([place]) 12/01/2017   Trochanteric bursitis of right hip 09/04/2017   Fuchs' corneal dystrophy 08/14/2016   Essential hypertension -> allowing permissive hypertension.  Medication stopped because of fatigue and orthostatic dizziness. 03/05/2015   NSTEMI (non-ST elevated myocardial infarction) [facility])    Coronary artery disease involving native coronary artery of native heart without angina pectoris    Vitamin D deficiency 07/04/2014   Fatigue 01/08/2014   Obesity (BMI 30-39.9) 01/12/2013   DM (diabetes mellitus), type 2 with  complications - CAD    Dyslipidemia, goal LDL below 70    Exertional shortness of breath 01/12/2012   Chronotropic incompetence 12/25/2011   Vertigo with mild postural lightheadedness. 08/13/2009   ERECTILE DYSFUNCTION, ORGANIC 05/01/2009   GERD 04/18/2008   BPH with urinary obstruction 07/27/2007   CAD S/P percutaneous coronary angioplasty - LADx2, & RCA x 3 07/12/2001    Class: History of    REFERRING DIAG: G30.1,F02.818 (ICD-10-CM) - Alzheimer's dementia, late onset, with behavioral disturbance ([place]) G20 (ICD-10-CM) - Parkinsonism, unspecified Parkinsonism type ([place])  THERAPY DIAG:  Unsteadiness on feet  Muscle weakness (generalized)  Other abnormalities of gait and mobility  Rationale for Evaluation and Treatment Rehabilitation  PERTINENT HISTORY: hypertension, hyperlipidemia, CAD, diabetes. Neuropsychological evaluation in 01/2020 indicated memory storage type problems, executive dysfunction, diminished processing efficiency, likely due to Alzheimer's disease. His parkinsonism suggests there may be a Lewy Body spectrum component.   PRECAUTIONS: fall  SUBJECTIVE: Pt reports his L toe is still bothering him. Pt's wife states that the doctor may need to take toenail off if it continues to bother pt. No falls. Exercises are going well   PAIN:  Are you having pain? Yes: NPRS scale: 5/10 Pain location: L toe Pain description: Throbbing  TODAY'S TREATMENT:  Therapeutic Exercise: NuStep level 5 with BUE/BLEs for 8 minutes for activity tolerance/endurance, ROM, and reciprocal movement patterns.  Pt required verbal cues to fully turn with rollator before sitting down on NuStep chair and swinging legs around NuStep to place feet in pedals when chair was turned in place.   Updated and demonstrated HEP (see bolded below) as listed below for improved step length/clearance, thoracic mobility and BLE strength. Pt required lengthy seated rest breaks in between sets:  - Plank with  Thoracic Rotation on Counter, x10 reps per side. Pt initially required max concurrent multimodal cues for proper form and sequencing and regressed to visual target for pt to reach to ("give me a high-5"). Noted increased difficulty performing on L side compared to R side.   - Standing March with Counter Support, x10 reps per side w/min cues for alternating sequence.   - Standing Heel Raise with Support, x15 reps. Min cues for fast concentric raise and slow eccentric lower, which pt able to demonstrate    Gait during session with rollator with cues throughout for incr stride length, managing safe distance to rollator and increased foot clearance.   PATIENT EDUCATION: Education details: updates to HEP Person educated: Patient and Spouse Education method: Explanation, Media planner, and Verbal cues Education comprehension: verbalized understanding      HOME EXERCISE PROGRAM: Access Code: [email or web address] URL: [email or web address] Date: 10/24/2021 Prepared by: Mickie Bail Jacara Benito  Exercises - Sit to Stand with Counter Support  - 1 x daily - 7 x weekly - 3 sets - 10 reps - Seated Scapular Retraction  - 1 x daily - 7 x weekly - 3 sets - 10 reps - Seated Long Arc Quad  - 1 x daily - 7 x weekly - 3 sets - 10 reps - Plank with Thoracic Rotation on Counter  - 1 x daily - 7 x weekly - 3 sets - 10 reps - Standing March with Counter Support  - 1 x daily - 7 x weekly - 3 sets - 10 reps - Standing Heel Raise with Support  - 1 x daily - 7 x weekly - 3 sets - 10 reps   To be added next session:  - Step sideways with arms reaching at counter   - 1 x daily - 7 x weekly - 3 sets - 10 reps - Calf stretch  - 1 x daily - 7 x weekly - 1 sets - 3 reps - 30-45 second hold     GOALS: Goals reviewed with patient? Yes   SHORT TERM GOALS: Target date: 10/02/2021   Pt will perform initial HEP w/min A from wife for improved strength, balance, transfers and gait.   Baseline: established  Goal status:  MET   2.  Pt will improve gait velocity to at  least 1.4 ft/s w/LRAD and S* for improved gait efficiency and safety   Baseline: 1.19 ft/s w/SPC and CGA; .72m/s or 2.36ft/s with rollator and supervision  Goal status: MET   3.  TUG to be assessed and STG/LTG written  Baseline: 45.56s with CGA Goal status: MET   4.  Pt will improve 5 x STS to less than or equal to 48 seconds w/BUE support to demonstrate improved functional strength and transfer efficiency.    Baseline: 55.06s w/BUE support; 37.41s with B UE Goal status: MET   5.  Pt will ambulate 150' or greater w/LRAD and S* for improved safety with functional mobility  Baseline: ambulating w/SPC and CGA; supervision with rollator  Goal status: MET  6. Pt will improve TUG to </= 37s secs to demonstrated reduced fall risk  Baseline: 45.56s; 37s with rollator  Goal Status: MET       LONG TERM GOALS: Target date: 10/30/2021   Pt will perform final HEP w/min A from wife for improved strength, balance, transfers and gait. Baseline:  Goal status: INITIAL   2.  Pt will improve TUG to </= 28 secs to demonstrated reduced fall risk  Baseline: 45.56s Goal status: INITIAL   3.  Pt will improve 5 x STS to less than or equal to 41 seconds w/BUE support to demonstrate improved functional strength and transfer efficiency.    Baseline: 55.06s w/BUE support  Goal status: INITIAL   4.  Pt and wife will verbalize and demonstrate fall prevention strategies in the home for reduced fall risk and improved safety  Baseline:  Goal status: INITIAL   5.  Pt will improve gait velocity to at least 72ft/s  w/LRAD and S* for improved gait efficiency and reduced fall risk   Baseline: 1.19 ft/s w/SPC and CGA Goal status: REVISED   6.  Pt will ambulate 300' or greater w/LRAD and S* for improved global endurance and functional mobility independence  Baseline: ambulating w/SPC and CGA Goal status: INITIAL   ASSESSMENT:   CLINICAL IMPRESSION: Emphasis  of skilled PT session on updating HEP to incorporate weight bearing tolerance activities to prepare for DC next week. Pt continues to require max multimodal cues for complex exercises but does well w/use of visual cues. Pt very fatigued throughout session and required several seated rest breaks. Continue POC.      OBJECTIVE IMPAIRMENTS Abnormal gait, decreased activity tolerance, decreased balance, decreased cognition, decreased coordination, decreased endurance, decreased knowledge of condition, decreased knowledge of use of DME, decreased mobility, difficulty walking, decreased strength, decreased safety awareness, and pain.    ACTIVITY LIMITATIONS carrying, lifting, bending, standing, squatting, sleeping, stairs, transfers, bed mobility, reach over head, and locomotion level   PARTICIPATION LIMITATIONS: meal prep, cleaning, laundry, medication management, personal finances, interpersonal relationship, driving, shopping, community activity, and yard work   PERSONAL FACTORS Age, Behavior pattern, Fitness, Past/current experiences, Time since onset of injury/illness/exacerbation, Transportation, and 1 comorbidity: Dementia  are also affecting patient's functional outcome.    REHAB POTENTIAL: Fair due to poor prognosis associated w/Lewy Body Dementia    CLINICAL DECISION MAKING: Evolving/moderate complexity   EVALUATION COMPLEXITY: Moderate   PLAN: PT FREQUENCY: 2x/week   PT DURATION: 8 weeks   PLANNED INTERVENTIONS: Therapeutic exercises, Therapeutic activity, Neuromuscular re-education, Balance training, Gait training, Patient/Family education, Self Care, Joint mobilization, Stair training, DME instructions, Manual therapy, and Re-evaluation   PLAN FOR NEXT SESSION: Add some standing balance to HEP. Start goal assessment. Cone rotation drill, resisted step ups w/ball throw,  fwd lunge w/slam ball. Obstacle course w/rollator, eccentric heel taps, sit <>Stands, step length, scifit for large  amplitude coordination, turns, lateral stepping. Exercises for posture, gait over compliant surfaces  Cruzita Lederer Eben Choinski, PT, DPT [place] [address] [place], [place]  [address] Phone:  [phone] Fax:  [phone] 10/24/21 9:28 AM

## 2021-10-29 ENCOUNTER — Ambulatory Visit: Payer: Medicare HMO | Admitting: Physical Therapy

## 2021-10-29 DIAGNOSIS — R2681 Unsteadiness on feet: Secondary | ICD-10-CM

## 2021-10-29 DIAGNOSIS — R2689 Other abnormalities of gait and mobility: Secondary | ICD-10-CM | POA: Diagnosis not present

## 2021-10-29 DIAGNOSIS — M6281 Muscle weakness (generalized): Secondary | ICD-10-CM

## 2021-10-29 NOTE — Therapy (Signed)
OUTPATIENT PHYSICAL THERAPY TREATMENT NOTE   Patient Name: Joe Murray MRN: [id number] DOB:26-Jun-1940, 81 y.o., male Today's Date: 10/29/2021  PCP: Alysia Penna, MD REFERRING PROVIDER: Alysia Penna, MD   END OF SESSION:   PT End of Session - 10/29/21 0852     Visit Number 16    Number of Visits 17    Date for PT Re-Evaluation 11/06/21    Authorization Type [employer]    Progress Note Due on Visit 20    PT Start Time 0848    PT Stop Time 0933    PT Time Calculation (min) 45 min    Equipment Utilized During Treatment Gait belt    Activity Tolerance Patient limited by fatigue    Behavior During Therapy [facility] for tasks assessed/performed                     Past Medical History:  Diagnosis Date   Alzheimer's dementia [facility])    sees Dr. Ellouise Newer   CAD S/P percutaneous coronary angioplasty 10/'03; 3/'09; 7/*09; 1/'17    a. Dr. Ellyn Hack; '03 - Cypher DES 3.0 mm 33 mm proximal-mid LAD (details D1 with ostial 60%); 3/'09 dRCA 2.75 mm x 13 mm Cypher DES (2.8 mm); 7/'09 pRCA 3.0 mm x 18 m Cypher DES (3.25 mm);; (2009) 2D Echo - EF 55%, (November 2014) nonischemic Myoview;; b. Promus DES to RCA and mid LAD 02/26/2015   Chronotropic incompetence 12/2011   ETT 11/2017: Normal blood pressure response.  No EKG changes.  Exercise stopped due to fatigue and dyspnea.  Heart rate increased to low 90s from 50s.  Suggest chronotropic incompetence.  Severely impaired exercise capacity. -->  Results reviewed with electrophysiology: Still not indication for pacemaker.   Diabetes mellitus type 2 in obese [facility])    CAD   Diverticulosis    Dyslipidemia, goal LDL below 70    on [employer] study medicaion.   ED (erectile dysfunction)    GERD (gastroesophageal reflux disease)    Hemorrhoids 07/2002   Internal and External   Insomnia    Nodular basal cell carcinoma (BCC) 05/08/2019   left ear (cx71fu)   Non-STEMI (non-ST elevated myocardial infarction) ([place]) 11/2001   a. Proximal LAD  tandem lesions -- long DES stent covering both;; b. Jan 2017: mRCA PCI, mLAD PCI   Parkinson's disease [facility])    sees Dr. Ellouise Newer   Persistent sinus bradycardia    Personal history of colonic adenomas 09/06/2012   Psoriasis    Resting HR in 54s   SCC (squamous cell carcinoma) 11/13/2001   right upper outer arm (txpbx)   SCC (squamous cell carcinoma) 11/13/2009   right upper ear rim (cx3 [id number])   SCC (squamous cell carcinoma) 01/20/2010   right nose (cx76fu)   SCC (squamous cell carcinoma) 03/28/2014   right side of nose ( cx69fu)   SCC (squamous cell carcinoma) 06/03/2016   right side of nose   SCC (squamous cell carcinoma) 05/08/2019   right ear inferior (cx88fu) cis   SCCA (squamous cell carcinoma) of skin 04/23/2021   Right Temporal Scalp (well diff) (tx p bx)   Spasm of esophagus    Squamous cell carcinoma in situ (SCCIS) 05/08/2019   Right Ear Inferior   Squamous cell carcinoma of skin 11/08/2001   top right rim ear    SVT (supraventricular tachycardia) ([place]) 2003   Vertigo, benign positional    Past Surgical History:  Procedure Laterality Date   CARDIAC CATHETERIZATION N/A 02/26/2015  Procedure: Left Heart Cath and Coronary Angiography;  Surgeon: Leonie Man, MD: mRCA 99% --> PCI. mLAD 75%-> FFR Guided PCI, Mod ISR in pLAD & pRCA   CARDIAC CATHETERIZATION N/A 02/26/2015   Procedure: Coronary Stent Intervention;  Surgeon: Leonie Man, MD;  Location: [facility] INVAS[facility] LAB: mRCA Promus Premier DES 3.0 x 12 (3.5), mLAD Promus Premier DES 2.75 x 20 (3.0)   CARDIAC CATHETERIZATION N/A 02/26/2015   Procedure: Intravascular Pressure Wire/FFR Study;  Surgeon: Leonie Man, MD;  Location: [place] CV LAB;  Service: Cardiovascular: LAD 75% - FFR + -> PCI   CATARACT EXTRACTION  0263,7858   COLONOSCOPY  12/16/2017   per Dr. Carlean Purl, adenomatous polyps, no repeats due to age    [place]  LAD - 2003, RCA 3 & 7/ '09   Cypher 3.0 x 32 mid LAD;  Cyper 2.75 x 13 - distal RCA, 3.0 x 18 Prox RCA   ESOPHAGOGASTRODUODENOSCOPY  08-11-02   esophageal dilation per Dr. Carlean Purl   HERNIA REPAIR     right inguinal    NM MYOVIEW LTD  Nov 2014   ~8 METS; EF 60%, no ischemia or infarction   PERCUTANEOUS CORONARY STENT INTERVENTION (PCI-S)  11/23/2001   NSTEMI: Prox-Mid LAD tandem ~80% lesions on either side of D1 (with 80% lesion) -- Cypher DES 3.0 mm x 33 m (covering both lesions)    PERCUTANEOUS CORONARY STENT INTERVENTION (PCI-S)  04/27/2007   Unstable Angina: Distal RCA 95%: Cypher 2.75x13  (2.8 mm); residual focal ~60%ISR in LAD stent, 60% D1 ostial (no PTCA on LAD due to no ischemia on ST)   PERCUTANEOUS CORONARY STENT INTERVENTION (PCI-S)  09/21/2007   Bradycardia & Unstable Angina: Prox RCA 70% - PCI Cypher DES 3.0 mm x 18 mm  (3.25 mm); ostial 60-70% jailed D1. LAD & distal RCA stents patent   SHOULDER SURGERY     left rotator cuff, Dr. Gladstone Lighter   TRANSTHORACIC ECHOCARDIOGRAM  March 2009   Normal LV size and function, EF 55%. Mild MR; mild RV dilation.   TRANSTHORACIC ECHOCARDIOGRAM  02/2015   EF 60-65%. Moderate concentric LVH. Normal function with normal regional wall motion. GR 1 DD   WRIST FRACTURE SURGERY     right   Patient Active Problem List   Diagnosis Date Noted   Myalgia due to statin 01/27/2021   Parkinson's disease ([place]) 11/07/2020   Alzheimer's dementia ([place]) 11/07/2020   Intermittent claudication ([place]) 12/01/2017   Trochanteric bursitis of right hip 09/04/2017   Fuchs' corneal dystrophy 08/14/2016   Essential hypertension -> allowing permissive hypertension.  Medication stopped because of fatigue and orthostatic dizziness. 03/05/2015   NSTEMI (non-ST elevated myocardial infarction) [facility])    Coronary artery disease involving native coronary artery of native heart without angina pectoris    Vitamin D deficiency 07/04/2014   Fatigue 01/08/2014   Obesity (BMI 30-39.9) 01/12/2013   DM (diabetes mellitus), type 2 with  complications - CAD    Dyslipidemia, goal LDL below 70    Exertional shortness of breath 01/12/2012   Chronotropic incompetence 12/25/2011   Vertigo with mild postural lightheadedness. 08/13/2009   ERECTILE DYSFUNCTION, ORGANIC 05/01/2009   GERD 04/18/2008   BPH with urinary obstruction 07/27/2007   CAD S/P percutaneous coronary angioplasty - LADx2, & RCA x 3 07/12/2001    Class: History of    REFERRING DIAG: G30.1,F02.818 (ICD-10-CM) - Alzheimer's dementia, late onset, with behavioral disturbance ([place]) G20 (ICD-10-CM) - Parkinsonism, unspecified Parkinsonism type ([place])  THERAPY DIAG:  Unsteadiness on feet  Muscle weakness (generalized)  Other abnormalities of gait and mobility  Rationale for Evaluation and Treatment Rehabilitation  PERTINENT HISTORY: hypertension, hyperlipidemia, CAD, diabetes. Neuropsychological evaluation in 01/2020 indicated memory storage type problems, executive dysfunction, diminished processing efficiency, likely due to Alzheimer's disease. His parkinsonism suggests there may be a Lewy Body spectrum component.   PRECAUTIONS: fall  SUBJECTIVE: Pt reports his L toe is no longer bothering him. HEP is going well, the sets and rep scheme of his HEP confuses him still. No new falls but "about 100" near misses during "turn thing" of HEP.   PAIN:  Are you having pain? No  TODAY'S TREATMENT:  Self-care/home management  Discussed POC moving forward and plan to DC this week. "She is elated, she don't believe in this stuff"  Briefly reviewed HEP as pt reports he has "added some things" to his exercises, which wife seemingly unaware of. Reiterated the importance of pt performing HEP only when he has supervision as he is not safe to perform alone and all his exercises require him to have BUE support and a sturdy chair behind him. Did not receive acknowledgement or understanding from wife or pt   Gait Training  Gait pattern: step to pattern, decreased step length-  Right, decreased step length- Left, decreased stride length, decreased hip/knee flexion- Right, decreased hip/knee flexion- Left, decreased ankle dorsiflexion- Right, decreased ankle dorsiflexion- Left, Right foot flat, Left foot flat, shuffling, trunk flexed, narrow BOS, poor foot clearance- Right, and poor foot clearance- Left Distance walked: 340'  Assistive device utilized: Environmental consultant - 4 wheeled Level of assistance: SBA Comments: Pt demonstrated shuffling gait pattern and forward flexed posture throughout. No verbal cues provided as pt is HOH and unable to comprehend verbal cues w/ambulatory tasks. On final lap, pt perseverated on being too fatigued to continue, requiring max encouraging cues to continue. However, pt reported RPE of 5/10 following gait. Lengthy seated rest break required following activity   NMR  At counter for improved step length/clearance, BLE coordination and global strengthening:  -Lateral 4" hurdle step-overs w/BUE support on counter and CGA, x2 each direction. Pt w/significant difficulty planning movement despite verbal and visual cues.  -Attempted fwd 4" hurdle navigation, x1 down and back, but once pt when down he was unable to sequence proper 180 degree turn to come back, so he stepped laterally despite max multimodal cues from therapist. "Are we done yet?"   Ther Ex  SciFit multi-peaks level 4 for 8 minutes using BUE/BLEs for neural priming for reciprocal movement, dynamic cardiovascular conditioning and increased amplitude of stepping. Min cues for increased knee extension bilaterally (LLE > RLE). RPE of 5/10 following activity     PATIENT EDUCATION: Education details: Continue HEP, only performing HEP when pt has supervision (pt reports he does them alone), plan to DC next session  Person educated: Patient and Spouse Education method: Explanation, Demonstration, and Verbal cues Education comprehension: verbalized understanding      HOME EXERCISE PROGRAM: Access  Code: [email or web address] URL: [email or web address] Date: 10/24/2021 Prepared by: Mickie Bail Lisanne Ponce  Exercises - Sit to Stand with Counter Support  - 1 x daily - 7 x weekly - 3 sets - 10 reps - Seated Scapular Retraction  - 1 x daily - 7 x weekly - 3 sets - 10 reps - Seated Long Arc Quad  - 1 x daily - 7 x weekly - 3 sets - 10 reps - Plank with Thoracic Rotation on Counter  - 1 x  daily - 7 x weekly - 3 sets - 10 reps - Standing March with Counter Support  - 1 x daily - 7 x weekly - 3 sets - 10 reps - Standing Heel Raise with Support  - 1 x daily - 7 x weekly - 3 sets - 10 reps      GOALS: Goals reviewed with patient? Yes   SHORT TERM GOALS: Target date: 10/02/2021   Pt will perform initial HEP w/min A from wife for improved strength, balance, transfers and gait.   Baseline: established  Goal status: MET   2.  Pt will improve gait velocity to at least 1.4 ft/s w/LRAD and S* for improved gait efficiency and safety   Baseline: 1.19 ft/s w/SPC and CGA; .45ms or 2.384fs with rollator and supervision  Goal status: MET   3.  TUG to be assessed and STG/LTG written  Baseline: 45.56s with CGA Goal status: MET   4.  Pt will improve 5 x STS to less than or equal to 48 seconds w/BUE support to demonstrate improved functional strength and transfer efficiency.    Baseline: 55.06s w/BUE support; 37.41s with B UE Goal status: MET   5.  Pt will ambulate 150' or greater w/LRAD and S* for improved safety with functional mobility  Baseline: ambulating w/SPC and CGA; supervision with rollator  Goal status: MET  6. Pt will improve TUG to </= 37s secs to demonstrated reduced fall risk  Baseline: 45.56s; 37s with rollator  Goal Status: MET       LONG TERM GOALS: Target date: 10/30/2021   Pt will perform final HEP w/min A from wife for improved strength, balance, transfers and gait. Baseline:  Goal status: INITIAL   2.  Pt will improve TUG to </= 28 secs to demonstrated reduced fall  risk  Baseline: 45.56s Goal status: INITIAL   3.  Pt will improve 5 x STS to less than or equal to 41 seconds w/BUE support to demonstrate improved functional strength and transfer efficiency.    Baseline: 55.06s w/BUE support  Goal status: INITIAL   4.  Pt and wife will verbalize and demonstrate fall prevention strategies in the home for reduced fall risk and improved safety  Baseline:  Goal status: INITIAL   5.  Pt will improve gait velocity to at least [id number]  w/LRAD and S* for improved gait efficiency and reduced fall risk   Baseline: 1.19 ft/s w/SPC and CGA Goal status: REVISED   6.  Pt will ambulate 300' or greater w/LRAD and S* for improved global endurance and functional mobility independence  Baseline: ambulating w/SPC and CGA; 340' w/rollator and S* Goal status: MET   ASSESSMENT:   CLINICAL IMPRESSION: Emphasis of skilled PT session on starting LTG assessment, increased step length and endurance. Pt ambulated 340' w/S* and rollator, meeting his goal of 300'. However, pt required max encouraging cues to participate as he fatigues quickly and is unmotivated. Pt "ecstatic" to DC this week and reports he "just wants to watch TV at home". Therapist unable to assess if pt is performing HEP at home, as pt reports he performs them alone and "almost fell" during an exercise that therapist did not assign to pt, which wife seemingly unaware of. Will plan to DC next session and continue to educate regarding importance of movement and safety at home. Continue POC.      OBJECTIVE IMPAIRMENTS Abnormal gait, decreased activity tolerance, decreased balance, decreased cognition, decreased coordination, decreased endurance, decreased knowledge of condition, decreased  knowledge of use of DME, decreased mobility, difficulty walking, decreased strength, decreased safety awareness, and pain.    ACTIVITY LIMITATIONS carrying, lifting, bending, standing, squatting, sleeping, stairs, transfers, bed  mobility, reach over head, and locomotion level   PARTICIPATION LIMITATIONS: meal prep, cleaning, laundry, medication management, personal finances, interpersonal relationship, driving, shopping, community activity, and yard work   PERSONAL FACTORS Age, Behavior pattern, Fitness, Past/current experiences, Time since onset of injury/illness/exacerbation, Transportation, and 1 comorbidity: Dementia  are also affecting patient's functional outcome.    REHAB POTENTIAL: Fair due to poor prognosis associated w/Lewy Body Dementia    CLINICAL DECISION MAKING: Evolving/moderate complexity   EVALUATION COMPLEXITY: Moderate   PLAN: PT FREQUENCY: 2x/week   PT DURATION: 8 weeks   PLANNED INTERVENTIONS: Therapeutic exercises, Therapeutic activity, Neuromuscular re-education, Balance training, Gait training, Patient/Family education, Self Care, Joint mobilization, Stair training, DME instructions, Manual therapy, and Re-evaluation   PLAN FOR NEXT SESSION: Goal assessment and [place], PT, [place] [address] [place], [place]  [address] Phone:  [phone] Fax:  [phone] 10/29/21 9:35 AM

## 2021-10-31 ENCOUNTER — Ambulatory Visit: Payer: Medicare HMO | Admitting: Physical Therapy

## 2021-10-31 DIAGNOSIS — R2681 Unsteadiness on feet: Secondary | ICD-10-CM

## 2021-10-31 DIAGNOSIS — R2689 Other abnormalities of gait and mobility: Secondary | ICD-10-CM

## 2021-10-31 DIAGNOSIS — M6281 Muscle weakness (generalized): Secondary | ICD-10-CM | POA: Diagnosis not present

## 2021-10-31 NOTE — Therapy (Signed)
OUTPATIENT PHYSICAL THERAPY TREATMENT NOTE- DISCHARGE SUMMARY   Patient Name: Joe Murray MRN: [id number] DOB:07/16/1940, 81 y.o., male Today's Date: 10/31/2021  PCP: Alysia Penna, MD REFERRING PROVIDER: Alysia Penna, MD  PHYSICAL THERAPY DISCHARGE SUMMARY  Visits from Start of Care: 17  Current functional level related to goals / functional outcomes: See below   Remaining deficits: Decreased activity tolerance, decreased balance, impaired gait deficits 2/2 PD   Education / Equipment: HEP to be performed w/S*    Patient agrees to discharge. Patient goals were partially met. Patient is being discharged due to maximized rehab potential.     END OF SESSION:   PT End of Session - 10/31/21 0848     Visit Number 17    Number of Visits 17    Date for PT Re-Evaluation 11/06/21    Authorization Type [employer]    Progress Note Due on Visit 20    PT Start Time 0845    PT Stop Time 0930    PT Time Calculation (min) 45 min    Equipment Utilized During Treatment Gait belt    Activity Tolerance Patient tolerated treatment well    Behavior During Therapy WFL for tasks assessed/performed                      Past Medical History:  Diagnosis Date   Alzheimer's dementia [facility])    sees Dr. Ellouise Newer   CAD S/P percutaneous coronary angioplasty 10/'03; 3/'09; 7/*09; 1/'17    a. Dr. Ellyn Hack; '03 - Cypher DES 3.0 mm 33 mm proximal-mid LAD (details D1 with ostial 60%); 3/'09 dRCA 2.75 mm x 13 mm Cypher DES (2.8 mm); 7/'09 pRCA 3.0 mm x 18 m Cypher DES (3.25 mm);; (2009) 2D Echo - EF 55%, (November 2014) nonischemic Myoview;; b. Promus DES to RCA and mid LAD 02/26/2015   Chronotropic incompetence 12/2011   ETT 11/2017: Normal blood pressure response.  No EKG changes.  Exercise stopped due to fatigue and dyspnea.  Heart rate increased to low 90s from 50s.  Suggest chronotropic incompetence.  Severely impaired exercise capacity. -->  Results reviewed with  electrophysiology: Still not indication for pacemaker.   Diabetes mellitus type 2 in obese [facility])    CAD   Diverticulosis    Dyslipidemia, goal LDL below 70    on [employer] study medicaion.   ED (erectile dysfunction)    GERD (gastroesophageal reflux disease)    Hemorrhoids 07/2002   Internal and External   Insomnia    Nodular basal cell carcinoma (BCC) 05/08/2019   left ear (cx33f)   Non-STEMI (non-ST elevated myocardial infarction) [place] 11/2001   a. Proximal LAD tandem lesions -- long DES stent covering both;; b. Jan 2017: mRCA PCI, mLAD PCI   Parkinson's disease ([facility]    sees Dr. KEllouise Newer  Persistent sinus bradycardia    Personal history of colonic adenomas 09/06/2012   Psoriasis    Resting HR in 54s   SCC (squamous cell carcinoma) 11/13/2001   right upper outer arm (txpbx)   SCC (squamous cell carcinoma) 11/13/2009   right upper ear rim (cx3 575f   SCC (squamous cell carcinoma) 01/20/2010   right nose ([id number]  SCC (squamous cell carcinoma) 03/28/2014   right side of nose ( [id number] SCC (squamous cell carcinoma) 06/03/2016   right side of nose   SCC (squamous cell carcinoma) 05/08/2019   right ear inferior ([id number]   SCCA (squamous cell carcinoma) of skin  04/23/2021   Right Temporal Scalp (well diff) (tx p bx)   Spasm of esophagus    Squamous cell carcinoma in situ (SCCIS) 05/08/2019   Right Ear Inferior   Squamous cell carcinoma of skin 11/08/2001   top right rim ear    SVT (supraventricular tachycardia) ([place]) 2003   Vertigo, benign positional    Past Surgical History:  Procedure Laterality Date   CARDIAC CATHETERIZATION N/A 02/26/2015   Procedure: Left Heart Cath and Coronary Angiography;  Surgeon: Leonie Man, MD: mRCA 99% --> PCI. mLAD 75%-> FFR Guided PCI, Mod ISR in pLAD & pRCA   CARDIAC CATHETERIZATION N/A 02/26/2015   Procedure: Coronary Stent Intervention;  Surgeon: Leonie Man, MD;  Location: [facility] INVASIVE CV LAB: mRCA Promus Premier DES  3.0 x 12 (3.5), mLAD Promus Premier DES 2.75 x 20 (3.0)   CARDIAC CATHETERIZATION N/A 02/26/2015   Procedure: Intravascular Pressure Wire/FFR Study;  Surgeon: Leonie Man, MD;  Location: [facility] CV LAB;  Service: Cardiovascular: LAD 75% - FFR + -> PCI   CATARACT EXTRACTION  [id number]   COLONOSCOPY  12/16/2017   per Dr. Carlean Purl, adenomatous polyps, no repeats due to age    [place]  LAD - 2003, RCA 3 & 7/ '09   Cypher 3.0 x 32 mid LAD; Cyper 2.75 x 13 - distal RCA, 3.0 x 18 Prox RCA   ESOPHAGOGASTRODUODENOSCOPY  08-11-02   esophageal dilation per Dr. Carlean Purl   HERNIA REPAIR     right inguinal    NM MYOVIEW LTD  Nov 2014   ~8 METS; EF 60%, no ischemia or infarction   PERCUTANEOUS CORONARY STENT INTERVENTION (PCI-S)  11/23/2001   NSTEMI: Prox-Mid LAD tandem ~80% lesions on either side of D1 (with 80% lesion) -- Cypher DES 3.0 mm x 33 m (covering both lesions)    PERCUTANEOUS CORONARY STENT INTERVENTION (PCI-S)  04/27/2007   Unstable Angina: Distal RCA 95%: Cypher 2.75x13  (2.8 mm); residual focal ~60%ISR in LAD stent, 60% D1 ostial (no PTCA on LAD due to no ischemia on ST)   PERCUTANEOUS CORONARY STENT INTERVENTION (PCI-S)  09/21/2007   Bradycardia & Unstable Angina: Prox RCA 70% - PCI Cypher DES 3.0 mm x 18 mm  (3.25 mm); ostial 60-70% jailed D1. LAD & distal RCA stents patent   SHOULDER SURGERY     left rotator cuff, Dr. Gladstone Lighter   TRANSTHORACIC ECHOCARDIOGRAM  March 2009   Normal LV size and function, EF 55%. Mild MR; mild RV dilation.   TRANSTHORACIC ECHOCARDIOGRAM  02/2015   EF 60-65%. Moderate concentric LVH. Normal function with normal regional wall motion. GR 1 DD   WRIST FRACTURE SURGERY     right   Patient Active Problem List   Diagnosis Date Noted   Myalgia due to statin 01/27/2021   Parkinson's disease ([place]) 11/07/2020   Alzheimer's dementia ([place]) 11/07/2020   Intermittent claudication ([place]) 12/01/2017   Trochanteric bursitis of right hip  09/04/2017   Fuchs' corneal dystrophy 08/14/2016   Essential hypertension -> allowing permissive hypertension.  Medication stopped because of fatigue and orthostatic dizziness. 03/05/2015   NSTEMI (non-ST elevated myocardial infarction) [facility])    Coronary artery disease involving native coronary artery of native heart without angina pectoris    Vitamin D deficiency 07/04/2014   Fatigue 01/08/2014   Obesity (BMI 30-39.9) 01/12/2013   DM (diabetes mellitus), type 2 with complications - CAD    Dyslipidemia, goal LDL below 70    Exertional shortness of  breath 01/12/2012   Chronotropic incompetence 12/25/2011   Vertigo with mild postural lightheadedness. 08/13/2009   ERECTILE DYSFUNCTION, ORGANIC 05/01/2009   GERD 04/18/2008   BPH with urinary obstruction 07/27/2007   CAD S/P percutaneous coronary angioplasty - LADx2, & RCA x 3 07/12/2001    Class: History of    REFERRING DIAG: G30.1,F02.818 (ICD-10-CM) - Alzheimer's dementia, late onset, with behavioral disturbance ([place]) G20 (ICD-10-CM) - Parkinsonism, unspecified Parkinsonism type ([place])   THERAPY DIAG:  Unsteadiness on feet  Muscle weakness (generalized)  Other abnormalities of gait and mobility  Rationale for Evaluation and Treatment Rehabilitation  PERTINENT HISTORY: hypertension, hyperlipidemia, CAD, diabetes. Neuropsychological evaluation in 01/2020 indicated memory storage type problems, executive dysfunction, diminished processing efficiency, likely due to Alzheimer's disease. His parkinsonism suggests there may be a Lewy Body spectrum component.   PRECAUTIONS: fall  SUBJECTIVE: Pt reports he is doing well. Still gets tired a lot. No new changes   PAIN:  Are you having pain? No  TODAY'S TREATMENT Ther Act  LTG assessment    [facility] PT Assessment - 10/31/21 0901       Transfers   Five time sit to stand comments  31.72s w/BUE support      Ambulation/Gait   Gait velocity 32.8' over 17.91s = 1.83 ft/s with rollator       Timed Up and Go Test   Normal TUG (seconds) 39.03   w/rollator           Lengthy discussion regarding goal assessment and importance of exercising at home. Pt inquiring about running, using a treadmill and using a bike at home which therapist strongly discouraged due to safety concerns. Encouraged pt become a member at [facility] and use the seated bike or participate in group classes, such as chair yoga. Pt verbalized understanding. Also implemented walking program for pt, starting at intervals of 2 minutes and adding 1 min/week until reaching 10 minutes. Pt's wife reports they have a good space outside for pt to walk, but pt very unmotivated. Encouraged pt to find some internal motivation to exercise, rather that be being able to eat the "good food" his wife cooks or to spend more time with grandkids. Pt verbalized understanding Educated wife on how to obtain new PT referral if pt's mobility/balance significantly changes. Wife verbalized understanding.    Gait Training  Gait pattern: step to pattern, decreased step length- Right, decreased step length- Left, decreased stride length, decreased hip/knee flexion- Right, decreased hip/knee flexion- Left, decreased ankle dorsiflexion- Right, decreased ankle dorsiflexion- Left, Right foot flat, Left foot flat, shuffling, trunk flexed, narrow BOS, poor foot clearance- Right, and poor foot clearance- Left Distance walked: 115' plus various clinic distances Assistive device utilized: Walker - 4 wheeled Level of assistance: SBA Comments: Pt demonstrated shuffling gait pattern and forward flexed posture throughout. No verbal cues provided as pt is HOH and unable to comprehend verbal cues w/ambulatory tasks.    PATIENT EDUCATION: Education details: See above  Person educated: Patient and Spouse Education method: Explanation, Demonstration, and Verbal cues Education comprehension: verbalized understanding      HOME EXERCISE PROGRAM: Access Code:  [email or web address] URL: [email or web address] Date: 10/24/2021 Prepared by: Mickie Bail Morine Kohlman  Exercises - Sit to Stand with Counter Support  - 1 x daily - 7 x weekly - 3 sets - 10 reps - Seated Scapular Retraction  - 1 x daily - 7 x weekly - 3 sets - 10 reps - Seated Long Arc Quad  - 1 x daily - 7 x weekly -  3 sets - 10 reps - Plank with Thoracic Rotation on Counter  - 1 x daily - 7 x weekly - 3 sets - 10 reps - Standing March with Counter Support  - 1 x daily - 7 x weekly - 3 sets - 10 reps - Standing Heel Raise with Support  - 1 x daily - 7 x weekly - 3 sets - 10 reps      GOALS: Goals reviewed with patient? Yes   SHORT TERM GOALS: Target date: 10/02/2021   Pt will perform initial HEP w/min A from wife for improved strength, balance, transfers and gait.   Baseline: established  Goal status: MET   2.  Pt will improve gait velocity to at least 1.4 ft/s w/LRAD and S* for improved gait efficiency and safety   Baseline: 1.19 ft/s w/SPC and CGA; .4ms or 2.342fs with rollator and supervision  Goal status: MET   3.  TUG to be assessed and STG/LTG written  Baseline: 45.56s with CGA Goal status: MET   4.  Pt will improve 5 x STS to less than or equal to 48 seconds w/BUE support to demonstrate improved functional strength and transfer efficiency.    Baseline: 55.06s w/BUE support; 37.41s with B UE Goal status: MET   5.  Pt will ambulate 150' or greater w/LRAD and S* for improved safety with functional mobility  Baseline: ambulating w/SPC and CGA; supervision with rollator  Goal status: MET  6. Pt will improve TUG to </= 37s secs to demonstrated reduced fall risk  Baseline: 45.56s; 37s with rollator  Goal Status: MET       LONG TERM GOALS: Target date: 10/30/2021   Pt will perform final HEP w/min A from wife for improved strength, balance, transfers and gait. Baseline:  Goal status: MET   2.  Pt will improve TUG to </= 28 secs to demonstrated reduced fall  risk  Baseline: 45.56s; 39.03s on 9/8  Goal status: NOT MET    3.  Pt will improve 5 x STS to less than or equal to 41 seconds w/BUE support to demonstrate improved functional strength and transfer efficiency.    Baseline: 55.06s w/BUE support ; 31.72s w/BUE support  Goal status: MET   4.  Pt and wife will verbalize and demonstrate fall prevention strategies in the home for reduced fall risk and improved safety  Baseline:  Goal status: MET   5.  Pt will improve gait velocity to at least [id number]  w/LRAD and S* for improved gait efficiency and reduced fall risk   Baseline: 1.19 ft/s w/SPC and CGA; 1.83 ft/s w/rollator  Goal status: NOT MET   6.  Pt will ambulate 300' or greater w/LRAD and S* for improved global endurance and functional mobility independence  Baseline: ambulating w/SPC and CGA; 340' w/rollator and S* Goal status: MET   ASSESSMENT:   CLINICAL IMPRESSION: Emphasis of skilled PT session on LTG assessment and DC from PT. Pt has met 4/6 LTGs, improving his efficiency w/5x STS and performing his HEP at home. Pt's wife able to verbalize fall prevention techniques in the home and pt able to verbalize need for use of rollator at all times. Pt has improved his gait speed from eval but did not meet goal level. Pt also continues to have freezing episodes during turns, therefore slowing him down during TUG. Pt has reached his max potential in therapy at this time and is strongly encouraged to continue exercising at home to maintain functional gains made  in therapy. Pt and wife verbalized agreement to DC from PT today.      OBJECTIVE IMPAIRMENTS Abnormal gait, decreased activity tolerance, decreased balance, decreased cognition, decreased coordination, decreased endurance, decreased knowledge of condition, decreased knowledge of use of DME, decreased mobility, difficulty walking, decreased strength, decreased safety awareness, and pain.    ACTIVITY LIMITATIONS carrying, lifting,  bending, standing, squatting, sleeping, stairs, transfers, bed mobility, reach over head, and locomotion level   PARTICIPATION LIMITATIONS: meal prep, cleaning, laundry, medication management, personal finances, interpersonal relationship, driving, shopping, community activity, and yard work   PERSONAL FACTORS Age, Behavior pattern, Fitness, Past/current experiences, Time since onset of injury/illness/exacerbation, Transportation, and 1 comorbidity: Dementia  are also affecting patient's functional outcome.    REHAB POTENTIAL: Fair due to poor prognosis associated w/Lewy Body Dementia    CLINICAL DECISION MAKING: Evolving/moderate complexity   EVALUATION COMPLEXITY: Moderate   PLAN: PT FREQUENCY: 2x/week   PT DURATION: 8 weeks   PLANNED INTERVENTIONS: Therapeutic exercises, Therapeutic activity, Neuromuscular re-education, Balance training, Gait training, Patient/Family education, Self Care, Joint mobilization, Stair training, DME instructions, Manual therapy, and Re-evaluation    Cruzita Lederer Jerimiah Wolman, PT, DPT [facility] [address] [place], [place]  [address] Phone:  [phone] Fax:  [phone] 10/31/21 9:39 AM

## 2021-11-11 ENCOUNTER — Ambulatory Visit (INDEPENDENT_AMBULATORY_CARE_PROVIDER_SITE_OTHER): Payer: Medicare HMO | Admitting: Family Medicine

## 2021-11-11 ENCOUNTER — Encounter: Payer: Self-pay | Admitting: Family Medicine

## 2021-11-11 VITALS — BP 124/80 | HR 52 | Temp 98.1°F | Ht 70.0 in | Wt 212.0 lb

## 2021-11-11 DIAGNOSIS — K219 Gastro-esophageal reflux disease without esophagitis: Secondary | ICD-10-CM | POA: Diagnosis not present

## 2021-11-11 DIAGNOSIS — R42 Dizziness and giddiness: Secondary | ICD-10-CM | POA: Diagnosis not present

## 2021-11-11 DIAGNOSIS — I2510 Atherosclerotic heart disease of native coronary artery without angina pectoris: Secondary | ICD-10-CM

## 2021-11-11 DIAGNOSIS — E118 Type 2 diabetes mellitus with unspecified complications: Secondary | ICD-10-CM

## 2021-11-11 DIAGNOSIS — E785 Hyperlipidemia, unspecified: Secondary | ICD-10-CM

## 2021-11-11 DIAGNOSIS — F02B0 Dementia in other diseases classified elsewhere, moderate, without behavioral disturbance, psychotic disturbance, mood disturbance, and anxiety: Secondary | ICD-10-CM

## 2021-11-11 DIAGNOSIS — N138 Other obstructive and reflux uropathy: Secondary | ICD-10-CM

## 2021-11-11 DIAGNOSIS — N401 Enlarged prostate with lower urinary tract symptoms: Secondary | ICD-10-CM

## 2021-11-11 DIAGNOSIS — G20 Parkinson's disease: Secondary | ICD-10-CM | POA: Diagnosis not present

## 2021-11-11 DIAGNOSIS — E559 Vitamin D deficiency, unspecified: Secondary | ICD-10-CM | POA: Diagnosis not present

## 2021-11-11 DIAGNOSIS — G301 Alzheimer's disease with late onset: Secondary | ICD-10-CM | POA: Diagnosis not present

## 2021-11-11 DIAGNOSIS — I10 Essential (primary) hypertension: Secondary | ICD-10-CM

## 2021-11-11 DIAGNOSIS — Z9861 Coronary angioplasty status: Secondary | ICD-10-CM | POA: Diagnosis not present

## 2021-11-11 DIAGNOSIS — Z23 Encounter for immunization: Secondary | ICD-10-CM

## 2021-11-11 DIAGNOSIS — R69 Illness, unspecified: Secondary | ICD-10-CM | POA: Diagnosis not present

## 2021-11-11 LAB — HEPATIC FUNCTION PANEL
ALT: 11 U/L (ref 0–53)
AST: 12 U/L (ref 0–37)
Albumin: 4 g/dL (ref 3.5–5.2)
Alkaline Phosphatase: 57 U/L (ref 39–117)
Bilirubin, Direct: 0.1 mg/dL (ref 0.0–0.3)
Total Bilirubin: 0.5 mg/dL (ref 0.2–1.2)
Total Protein: 6.7 g/dL (ref 6.0–8.3)

## 2021-11-11 LAB — BASIC METABOLIC PANEL
BUN: 26 mg/dL — ABNORMAL HIGH (ref 6–23)
CO2: 23 mEq/L (ref 19–32)
Calcium: 9.1 mg/dL (ref 8.4–10.5)
Chloride: 104 mEq/L (ref 96–112)
Creatinine, Ser: 1.31 mg/dL (ref 0.40–1.50)
GFR: 50.95 mL/min — ABNORMAL LOW (ref >60.00)
Glucose, Bld: 147 mg/dL — ABNORMAL HIGH (ref 70–99)
Potassium: 4.7 mEq/L (ref 3.5–5.1)
Sodium: 137 mEq/L (ref 135–145)

## 2021-11-11 LAB — CBC WITH DIFFERENTIAL/PLATELET
Basophils Absolute: 0 10*3/uL (ref 0.0–0.1)
Basophils Relative: 0.7 % (ref 0.0–3.0)
Eosinophils Absolute: 0.2 10*3/uL (ref 0.0–0.7)
Eosinophils Relative: 3 % (ref 0.0–5.0)
HCT: 42.6 % (ref 39.0–52.0)
Hemoglobin: 14.5 g/dL (ref 13.0–17.0)
Lymphocytes Relative: 29.6 % (ref 12.0–46.0)
Lymphs Abs: 2.1 10*3/uL (ref 0.7–4.0)
MCHC: 34.1 g/dL (ref 30.0–36.0)
MCV: 95 fl (ref 78.0–100.0)
Monocytes Absolute: 0.5 10*3/uL (ref 0.1–1.0)
Monocytes Relative: 7.2 % (ref 3.0–12.0)
Neutro Abs: 4.2 10*3/uL (ref 1.4–7.7)
Neutrophils Relative %: 59.5 % (ref 43.0–77.0)
Platelets: 186 10*3/uL (ref 150.0–400.0)
RBC: 4.48 Mil/uL (ref 4.22–5.81)
RDW: 13 % (ref 11.5–15.5)
WBC: 7 10*3/uL (ref 4.0–10.5)

## 2021-11-11 LAB — LIPID PANEL
Cholesterol: 122 mg/dL (ref 0–200)
HDL: 52.5 mg/dL (ref >39.00)
LDL Cholesterol: 43 mg/dL (ref 0–99)
NonHDL: 69.97
Total CHOL/HDL Ratio: 2
Triglycerides: 137 mg/dL (ref 0.0–149.0)
VLDL: 27.4 mg/dL (ref 0.0–40.0)

## 2021-11-11 LAB — TSH: TSH: 2.27 u[IU]/mL (ref 0.35–5.50)

## 2021-11-11 LAB — VITAMIN D 25 HYDROXY (VIT D DEFICIENCY, FRACTURES): VITD: 70.16 ng/mL (ref 30.00–100.00)

## 2021-11-11 LAB — PSA: PSA: 1.78 ng/mL (ref 0.10–4.00)

## 2021-11-11 LAB — HEMOGLOBIN A1C: Hgb A1c MFr Bld: 7 % — ABNORMAL HIGH (ref 4.6–6.5)

## 2021-11-11 MED ORDER — GLIPIZIDE 5 MG PO TABS: 5.0000 mg | ORAL_TABLET | Freq: Two times a day (BID) | ORAL | 3 refills | Status: AC

## 2021-11-11 NOTE — Progress Notes (Signed)
Subjective:    Patient ID: Joe Murray, male    DOB: March 22, 1940, 81 y.o.   MRN: [id number]  HPI Here with his wife to  follow up on issues. He is doing fairly well. He sees Dr. Delice Lesch for Parkinsons disease and Alzheimers. He recently finished 8 weeks of PT, and he walks with his walker around his house every day. His BP has been stable. His am fasting glucoses average 120-140. His GERD and vertigo are stable. His appetite is good and he sleeps well.    Review of Systems  Constitutional: Negative.   HENT: Negative.    Eyes: Negative.   Respiratory: Negative.    Cardiovascular: Negative.   Gastrointestinal: Negative.   Genitourinary: Negative.   Musculoskeletal: Negative.   Skin: Negative.   Neurological:  Positive for tremors and weakness.  Psychiatric/Behavioral: Negative.         Objective:   Physical Exam Constitutional:      General: He is not in acute distress.    Appearance: Normal appearance. He is well-developed. He is not diaphoretic.  HENT:     Head: Normocephalic and atraumatic.     Right Ear: External ear normal.     Left Ear: External ear normal.     Nose: Nose normal.     Mouth/Throat:     Pharynx: No oropharyngeal exudate.  Eyes:     General: No scleral icterus.       Right eye: No discharge.        Left eye: No discharge.     Conjunctiva/sclera: Conjunctivae normal.     Pupils: Pupils are equal, round, and reactive to light.  Neck:     Thyroid: No thyromegaly.     Vascular: No JVD.     Trachea: No tracheal deviation.  Cardiovascular:     Rate and Rhythm: Normal rate and regular rhythm.     Heart sounds: Normal heart sounds. No murmur heard.    No friction rub. No gallop.  Pulmonary:     Effort: Pulmonary effort is normal. No respiratory distress.     Breath sounds: Normal breath sounds. No wheezing or rales.  Chest:     Chest wall: No tenderness.  Abdominal:     General: Bowel sounds are normal. There is no distension.     Palpations:  Abdomen is soft. There is no mass.     Tenderness: There is no abdominal tenderness. There is no guarding or rebound.  Genitourinary:    Penis: Normal. No tenderness.      Testes: Normal.  Musculoskeletal:        General: No tenderness. Normal range of motion.     Cervical back: Neck supple.  Lymphadenopathy:     Cervical: No cervical adenopathy.  Skin:    General: Skin is warm and dry.     Coloration: Skin is not pale.     Findings: No erythema or rash.  Neurological:     Mental Status: He is alert and oriented to person, place, and time. Mental status is at baseline.     Motor: No abnormal muscle tone.     Deep Tendon Reflexes: Reflexes are normal and symmetric.     Comments: Shuffling gait   Psychiatric:        Behavior: Behavior normal.        Thought Content: Thought content normal.        Judgment: Judgment normal.           Assessment &  Plan:  His Parkinsons and Alzheimers seen to be stable. He shows no behavioral problems per his wife. His HTN and vertigo and GERD are stable. We will get fasting labs to check an A1c, lipids, etc. Given a flu shot. We spent a total of (32   ) minutes reviewing records and discussing these issues.  Alysia Penna, MD

## 2021-11-11 NOTE — Addendum Note (Signed)
Addended by: Wyvonne Lenz on: 11/11/2021 09:22 AM   Modules accepted: Orders

## 2021-12-24 ENCOUNTER — Ambulatory Visit: Payer: Medicare HMO | Admitting: Dermatology

## 2022-01-05 ENCOUNTER — Telehealth: Payer: Self-pay | Admitting: Pharmacist

## 2022-01-05 NOTE — Progress Notes (Unsigned)
Chronic Care Management Pharmacy Note  01/06/2022 Name:  Joe Murray MRN:  [id number] DOB:  Jul 18, 1940  Summary: BP not at goal < 140/90 per home readings Pt feels sleepy after eating  Recommendations/Changes made from today's visit: -Recommended monitoring BP at home daily -Recommended monitoring blood sugars 1-2 hours after eating -Recommended trial of 2 or 3 mg of melatonin for nights he has trouble sleeping  Plan: BP/DM assessment in 1 week   Subjective: Joe Murray is an 81 y.o. year old male who is a primary patient of Joe Morale, MD.  The CCM team was consulted for assistance with disease management and care coordination needs.    Engaged with patient by telephone for follow up visit in response to provider referral for pharmacy case management and/or care coordination services.   Consent to Services:  The patient was given information about Chronic Care Management services, agreed to services, and gave verbal consent prior to initiation of services.  Please see initial visit note for detailed documentation.   Patient Care Team: Joe Morale, MD as PCP - General Joe Murray Joe Green, MD as PCP - Cardiology (Cardiology) Joe Sprang, MD as Consulting Physician (Neurology) Joe Monarch, MD (Inactive) as Consulting Physician (Dermatology) Joe Murray, [facility] as Pharmacist (Pharmacist)  Recent office visits: 11/11/21 Joe Penna MD: Patient was seen for DM follow up. No medication changes.  Recent consult visits: 10/31/21 Joe Murray, PT (outpatient rehab): Patient presented for PT session of unsteadiness on feet.  10/15/21 Joe Murray, DPM (podiatry): Patient presented for ingrown nail and pain around big toe.   09/19/21 Joe Halsted, MD (eye center): Patient presented for follow up eye exam.  08/27/21 Joe Newer, MD (neurology): Patient presented for parkinson's and Alzheimer's follow up. Increased Sinemet to 25/100 mg TID. Referred to PT.   Follow up in 6 months.  06/23/21 Joe Monarch, MD (dermatology): Patient presented for basal cell carcinoma follow up. Follow up in 1 month.  Hospital visits: None in previous 6 months   Objective:  Lab Results  Component Value Date   CREATININE 1.31 11/11/2021   BUN 26 (H) 11/11/2021   GFR 50.95 (L) 11/11/2021   GFRNONAA 55 (L) 06/01/2019   GFRAA 64 06/01/2019   NA 137 11/11/2021   K 4.7 11/11/2021   CALCIUM 9.1 11/11/2021   CO2 23 11/11/2021   GLUCOSE 147 (H) 11/11/2021    Lab Results  Component Value Date/Time   HGBA1C 7.0 (H) 11/11/2021 08:33 AM   HGBA1C 6.9 (H) 11/07/2020 10:15 AM   GFR 50.95 (L) 11/11/2021 08:33 AM   GFR 45.06 (L) 11/07/2020 10:15 AM   MICROALBUR 1.8 08/09/2015 09:07 AM   MICROALBUR 0.7 06/27/2012 09:45 AM    Last diabetic Eye exam: No results found for: "HMDIABEYEEXA"  Last diabetic Foot exam: No results found for: "HMDIABFOOTEX"   Lab Results  Component Value Date   CHOL 122 11/11/2021   HDL 52.50 11/11/2021   LDLCALC 43 11/11/2021   LDLDIRECT 85.5 06/27/2012   TRIG 137.0 11/11/2021   CHOLHDL 2 11/11/2021       Latest Ref Rng & Units 11/11/2021    8:33 AM 11/07/2020   10:15 AM 11/07/2019   10:48 AM  Hepatic Function  Total Protein 6.0 - 8.3 g/dL 6.7  7.1  6.6   Albumin 3.5 - 5.2 g/dL 4.0  4.4    AST 0 - 37 U/L _0 ALT 0 - 53 U/L 11  12  13   Alk Phosphatase 39 - 117 U/L 57  55    Total Bilirubin 0.2 - 1.2 mg/dL 0.5  0.6  0.5   Bilirubin, Direct 0.0 - 0.3 mg/dL 0.1  0.1  0.1     Lab Results  Component Value Date/Time   TSH 2.27 11/11/2021 08:33 AM   TSH 2.58 11/07/2020 10:15 AM       Latest Ref Rng & Units 11/11/2021    8:33 AM 11/07/2020   10:15 AM 11/07/2019   10:48 AM  CBC  WBC 4.0 - 10.5 K/uL 7.0  7.2  7.2   Hemoglobin 13.0 - 17.0 g/dL 14.5  15.1  14.8   Hematocrit 39.0 - 52.0 % 42.6  45.5  44.4   Platelets 150.0 - 400.0 K/uL 186.0  199.0  204     Lab Results  Component Value Date/Time   VD25OH 70.16  11/11/2021 08:33 AM   VD25OH 104.01 ([place]) 05/12/2021 09:16 AM    Clinical ASCVD: Yes  The ASCVD Risk score (Arnett DK, et al., 2019) failed to calculate for the following reasons:   The 2019 ASCVD risk score is only valid for ages 39 to 40   The patient has a prior MI or stroke diagnosis       11/11/2021    9:47 AM 03/27/2021   10:57 AM 03/26/2020   10:41 AM  Depression screen PHQ 2/9  Decreased Interest 0 0 0  Down, Depressed, Hopeless 0 0 0  PHQ - 2 Score 0 0 0  Altered sleeping 1    Tired, decreased energy 1    Change in appetite 0    Feeling bad or failure about yourself  0    Trouble concentrating 0    Moving slowly or fidgety/restless 3    Suicidal thoughts 0    PHQ-9 Score 5    Difficult doing work/chores Not difficult at all        Social History   Tobacco Use  Smoking Status Every Day   Types: Cigars   Start date: 07/06/1952  Smokeless Tobacco Never  Tobacco Comments   1-2 per day; he says "I really don't inhale"   BP Readings from Last 3 Encounters:  11/11/21 124/80  09/24/21 (!) 149/72  08/27/21 (!) 154/77   Pulse Readings from Last 3 Encounters:  11/11/21 (!) 52  09/24/21 (!) 50  08/27/21 (!) 57   Wt Readings from Last 3 Encounters:  11/11/21 212 lb (96.2 kg)  08/27/21 216 lb (98 kg)  03/27/21 215 lb 8 oz (97.8 kg)   BMI Readings from Last 3 Encounters:  11/11/21 30.42 kg/m  08/27/21 30.99 kg/m  03/27/21 30.92 kg/m    Assessment/Interventions: Review of patient past medical history, allergies, medications, health status, including review of consultants reports, laboratory and other test data, was performed as part of comprehensive evaluation and provision of chronic care management services.   SDOH:  (Social Determinants of Health) assessments and interventions performed: Yes  SDOH Interventions    Flowsheet Row Chronic Care Management from 01/06/2022 in [place] at [place] from 03/27/2021 in [place] at  [place] Management from 01/07/2021 in [place] at [place] from 03/26/2020 in [place] at [place] Interventions -- Intervention Not Indicated -- Intervention Not Indicated  Housing Interventions -- Intervention Not Indicated -- Intervention Not Indicated  Transportation Interventions -- Intervention Not Indicated Intervention Not Indicated Intervention  Not Indicated  Financial Strain Interventions Other (Comment)  [unable to afford Repatha in the donut hole but does not qualify for assistance] Intervention Not Indicated Intervention Not Indicated Intervention Not Indicated  Physical Activity Interventions -- Intervention Not Indicated -- Intervention Not Indicated  Stress Interventions -- Intervention Not Indicated -- Intervention Not Indicated  Social Connections Interventions -- Intervention Not Indicated -- Intervention Not Indicated       SDOH Screenings   Food Insecurity: No Food Insecurity (03/27/2021)  Housing: Low Risk  (03/27/2021)  Transportation Needs: No Transportation Needs (03/27/2021)  Alcohol Screen: Low Risk  (03/27/2021)  Depression (PHQ2-9): Medium Risk (11/11/2021)  Financial Resource Strain: Medium Risk (01/06/2022)  Physical Activity: Insufficiently Active (03/27/2021)  Social Connections: Socially Integrated (03/27/2021)  Stress: No Stress Concern Present (03/27/2021)  Tobacco Use: High Risk (11/11/2021)    [place]  Allergies  Allergen Reactions   Beta Adrenergic Blockers Other (See Comments)    chronotropic incompetence   Praluent [Alirocumab]     Diffuse Rash - improved when stopping & taking Benadryl   Sulfonamide Derivatives     unknown   Statins Other (See Comments)    REACTION: myalgias    Medications Reviewed Today     Reviewed by Joe Murray, [facility] (Pharmacist) on 01/06/22 at 16  Med List Status: <None>   Medication Order Taking? Sig Documenting  Provider Last Dose Status Informant  aspirin EC 81 MG tablet [id number]  Take 81 mg by mouth daily. [provider]  Active   carbidopa-levodopa (SINEMET IR) 25-100 MG tablet [id number]  Take 1 tablet three times a day with meals Joe Sprang, MD  Active   glipiZIDE (GLUCOTROL) 5 MG tablet [id number]  Take 1 tablet (5 mg total) by mouth 2 (two) times daily before a meal. Joe Morale, MD  Active   Lancets (BD LANCET ULTRAFINE 30G) MISC [id number]  USE ONE  TO CHECK GLUCOSE ONCE DAILY Joe Morale, MD  Active Spouse/Significant Other  MAGNESIUM CHLORIDE PO [id number]  Take 250 mg by mouth daily. [provider]  Active Spouse/Significant Other  memantine (NAMENDA) 10 MG tablet [id number]  Take 1 tablet by mouth twice daily Joe Sprang, MD  Active   nitroGLYCERIN (NITROSTAT) 0.4 MG SL tablet [id number]  PLACE 1 TABLET UNDER THE TONGUE EVERY 5 MINUTES FOR 3 DOSES AS NEEDED FOR CHEST PAIN Joe Man, MD  Active   [facility] ULTRA test strip [id number]  USE TO TEST DAILY Joe Morale, MD  Active   POTASSIUM GLUCONATE PO [id number]  Take 99 mg by mouth daily. [provider]  Active Spouse/Significant Other  prednisoLONE acetate (PRED FORTE) 1 % ophthalmic suspension [id number]  Place 1 drop into the right eye daily. [provider]  Active   REPATHA [place] 420 MG/3.5ML SOCT [id number] No INJECT 420 MG INTO SKIN EVERY 30 DAYS  Patient not taking: Reported on 01/06/2022   Joe Man, MD Not Taking Active   vitamin B-12 (CYANOCOBALAMIN) 1000 MCG tablet [id number]  Take 1,000 mcg by mouth daily. [provider]  Active             Patient Active Problem List   Diagnosis Date Noted   Myalgia due to statin 01/27/2021   Parkinson's disease 11/07/2020   Alzheimer's dementia ([place]) 11/07/2020   Intermittent claudication ([place]) 12/01/2017   Trochanteric bursitis of right hip 09/04/2017   Fuchs' corneal dystrophy 08/14/2016   Essential  hypertension -> allowing permissive hypertension.  Medication stopped because of fatigue and orthostatic dizziness. 03/05/2015   NSTEMI (non-ST elevated myocardial infarction) [facility])    Coronary artery disease involving native coronary artery of native heart without angina pectoris    Vitamin D deficiency 07/04/2014   Fatigue 01/08/2014   Obesity (BMI 30-39.9) 01/12/2013   DM (diabetes mellitus), type 2 with complications - CAD    Dyslipidemia, goal LDL below 70    Exertional shortness of breath 01/12/2012   Chronotropic incompetence 12/25/2011   Vertigo with mild postural lightheadedness. 08/13/2009   ERECTILE DYSFUNCTION, ORGANIC 05/01/2009   GERD 04/18/2008   BPH with urinary obstruction 07/27/2007   CAD S/P percutaneous coronary angioplasty - LADx2, & RCA x 3 07/12/2001    Class: History of    Immunization History  Administered Date(s) Administered   Fluad Quad(high Dose 65+) 11/04/2018, 11/07/2019, 11/11/2021   Influenza Split 11/27/2010   Influenza, High Dose Seasonal PF 12/18/2013, 01/02/2015, 12/26/2016, 12/19/2017, 12/18/2020   Influenza-Unspecified 12/24/2012, 12/18/2013, 12/25/2014   PFIZER(Purple Top)SARS-COV-2 Vaccination 03/16/2019, 04/06/2019, 10/14/2019, 09/08/2020   Pfizer Covid-19 Vaccine Bivalent Booster 81yr & up 12/25/2020   Pneumococcal Conjugate-13 08/16/2015   Pneumococcal Polysaccharide-23 05/01/2009   Zoster Recombinat (Shingrix) 12/19/2017, 02/18/2018   Spoke with patient's wife for the call as she manages his medications. She reports patient gets confused more often lately. An example was that he thought he had to fast before this telephone call.  Patient sleeps a lot during the day time. Patient is not really sleeping at night much and his wife thinks he has this switched. He sometimes wakes up around 2am. Patient gets up without his wife's knowledge which is concerning to her. They have never tried anything for sleep so far. Recommended melatonin 1 hour  before bedtime. He falls asleep after dinner and sometimes after breakfast for a couple of hours.  Patient used to snore and has snored less since getting older. Patient's wife's mother had alzheimers and she put her in her nursing home and she was up every hour so she is used to taking care of someone like this.  Conditions to be addressed/monitored:  Hypertension, Hyperlipidemia, Diabetes, Coronary Artery Disease, and Parkinson's and Alzheimer's dementia  Conditions addressed this visit: Hypertension, diabetes, hypertension  Care Plan : CCM Pharmacy Care Plan  Updates made by PViona Murray RChirenosince 01/06/2022 12:00 AM     Problem: Problem: Hypertension, Hyperlipidemia, Diabetes, Coronary Artery Disease, and Parkinson's and Alzheimer's dementia      Long-Range Goal: Patient-Specific Goal   Start Date: 01/07/2021  Expected End Date: 01/07/2022  Recent Progress: On track  Priority: High  Note:   Current Barriers:  Unable to independently monitor therapeutic efficacy  Pharmacist Clinical Goal(s):  Patient will verbalize ability to afford treatment regimen achieve adherence to monitoring guidelines and medication adherence to achieve therapeutic efficacy through collaboration with PharmD and provider.   Interventions: 1:1 collaboration with FLaurey Morale MD regarding development and update of comprehensive plan of care as evidenced by provider attestation and co-signature Inter-disciplinary care team collaboration (see longitudinal plan of care) Comprehensive medication review performed; medication list updated in electronic medical record  Hypertension (BP goal <140/90) -Not ideally controlled -Current treatment: No medications -Medications previously tried: n/a  -Current home readings: 149/62 (yesterday morning), 199/84 (yesterday evening), 143/69 (this morning); checking more frequently and will report back in 1 week with more readings -Current dietary habits: tries  to eat healthy; not eating many salty snacks but does eat some canned vegetables and canned soup; wife  is rinsing out canned vegetables with water -Current exercise habits: limited -Denies hypotensive/hypertensive symptoms -Educated on BP goals and benefits of medications for prevention of heart attack, stroke and kidney damage; Proper BP monitoring technique; Symptoms of hypotension and importance of maintaining adequate hydration; -Counseled to monitor BP at home daily, document, and provide log at future appointments -Counseled on diet and exercise extensively  Hyperlipidemia: (LDL goal < 70) -Controlled -Current treatment: Repatha inject 420 mg every 30 days - Appropriate, Effective, Safe, Accessible (not taking due to cost until January) -Medications previously tried: Praluent (rash), statins (myalgias), fish oil (worried about memory)  -Current dietary patterns: tries to pay attention to his diet -Current exercise habits: not much exercise -Educated on Cholesterol goals;  Importance of limiting foods high in cholesterol; Exercise goal of 150 minutes per week; -Counseled on diet and exercise extensively Recommended to continue current medication Assessed patient finances. Patient will not qualify for [employer].  CAD (Goal: prevent events) -Controlled -Current treatment  Aspirin 81 mg 1 tablet daily - Appropriate, Effective, Safe, Accessible Nitroglycerin 0.4 mg SL 1 tablet as needed - Appropriate, Effective, Safe, Accessible -Medications previously tried: none  -Recommended to continue current medication   Diabetes (A1c goal <7%) -Not ideally controlled -Current medications: Glipizide 5 mg 1 tablet twice daily before meals - Appropriate, Query effective, Safe, Accessible -Medications previously tried: none  -Current home glucose readings fasting glucose: 140-160s; 161 this morning (not checking often - 2-3 times a month) post prandial glucose: none -Denies  hypoglycemic/hyperglycemic symptoms -Current meal patterns:  breakfast: sausage biscuit, eggs, honey dew and orange juice (trop 50) lunch: protein bar for lunch & candy  dinner: chili, soups; no fried foods - not as many carbs  snacks: m&ms drinks: orange juice, water; crystal lite lemonade; coffee in AM (decaf) - black -Current exercise: limited -Educated on A1c and blood sugar goals; Benefits of routine self-monitoring of blood sugar; Carbohydrate counting and/or plate method -Counseled to check feet daily and get yearly eye exams -Counseled on diet and exercise extensively Recommended to continue current medication Recommended checking blood sugars 1-2 hours after eating breakfast and dinner to see if this is causing fatigue.  Parkinson's disease (Goal: minimize symptoms) -Controlled -Current treatment  Carbidopa-levodopa 25-100 mg 1 tablet twice dailyAppropriate, Effective, Safe, Accessible -Medications previously tried: none  -Recommended to continue current medication  Memory loss (Goal: prevent rapid worsening of memory) -Controlled -Current treatment  Memantine 10 mg 1 tablet twice daily - Appropriate, Effective, Safe, Accessible -Medications previously tried: none  -Recommended to continue current medication   Health Maintenance -Vaccine gaps: COVID booster -Current therapy:  Vitamin B12 Magnesium chloride 1 tablet daily Potassium gluconate 595 mg 1 tablet daily -Educated on Cost vs benefit of each product must be carefully weighed by individual consumer -Patient is satisfied with current therapy and denies issues - Consider stopping cinnamon as they are unsure of the benefit.  [name] Patient will:  - take medications as prescribed as evidenced by patient report and record review check glucose a few times a week, document, and provide at future appointments target a minimum of 150 minutes of moderate intensity exercise weekly engage  in dietary modifications by limiting carb intake with breakfast  Follow Up Plan: Telephone follow up appointment with care management team member scheduled for: 6 months      Medication Assistance: None required.  Patient affirms current coverage meets needs.  Compliance/Adherence/Medication fill history: Care Gaps: Foot exam, microalbumin, eye exam (had 09/19/21), COVID booster, tetanus  Last BP - 124/80 on 11/11/2021 Last A1C - 7.0 on 11/11/2021  Star-Rating Drugs: Glipizide 5 mg - last filled 11/11/2021 90 DS at [facility]   Patient's preferred pharmacy is:  [facility] [address] [place] - [place] [address] [place] [place] [address] Phone: [phone] Fax: [place] Halma, [place] [address] [place] [address] Phone: [phone] Fax: [phone]   Uses pill box? Yes Pt endorses 99% compliance - Doesn't want upstream   We discussed: Benefits of medication synchronization, packaging and delivery as well as enhanced pharmacist oversight with Upstream. Patient decided to: Continue current medication management strategy  Care Plan and Follow Up Patient Decision:  Patient agrees to Care Plan and Follow-up.  Plan: The care management team will reach out to the patient again over the next 7 days.  Jeni Salles, PharmD, [place] Pharmacist [place] at [place]

## 2022-01-05 NOTE — Chronic Care Management (AMB) (Signed)
    Chronic Care Management Pharmacy Assistant   Name: RAYMOND BHARDWAJ  MRN: [id number] DOB: 03/25/1940  01/06/2022 APPOINTMENT REMINDER  [name] was reminded to have all medications, supplements and any blood glucose and blood pressure readings available for review with Jeni Salles, Pharm. D, at his telephone visit on 01/06/2022 at 9:45.  Care Gaps: AWV - scheduled 03/30/2022 Last BP - 124/80 on 11/11/2021 Last A1C - 7.0 on 11/11/2021 Foot exam - never done Urine ACR - overdue Eye exam - overdue Tdap - postponed Covid - postponed  Star Rating Drug: Glipizide 5 mg - last filled 11/11/2021 90 DS at [facility]  Any gaps in medications fill history? No   Gennie Alma [facility]  Catering manager [phone]

## 2022-01-06 ENCOUNTER — Ambulatory Visit (INDEPENDENT_AMBULATORY_CARE_PROVIDER_SITE_OTHER): Payer: Medicare HMO | Admitting: Pharmacist

## 2022-01-06 DIAGNOSIS — E785 Hyperlipidemia, unspecified: Secondary | ICD-10-CM

## 2022-01-06 DIAGNOSIS — I10 Essential (primary) hypertension: Secondary | ICD-10-CM

## 2022-01-13 ENCOUNTER — Telehealth: Payer: Self-pay | Admitting: Pharmacist

## 2022-01-13 NOTE — Chronic Care Management (AMB) (Signed)
    Chronic Care Management Pharmacy Assistant   Name: DEWIE AHART  MRN: [id number] DOB: 1941/02/03  Reason for Encounter: Follow up blood sugar and blood pressure readings.   Spoke with patients wife, she states she has forgotten to check his blood pressures and blood sugars.  She states she will start checking today. Will plan to call her in one week.   01/21/22 I spoke with patients wife, patients recent readings are:  01/14/22 BP - 132/63 and BS - 298 just after eating breakfast 01/17/22 BP - 152/62 and BS - 130 AM fasting 01/18/22 BP - 187/83 and BS - 139 AM fasting 01/19/22 BP - 138/77 and BS - didn't check 01/20/22 BP - 167/84 and BS - 128 AM fasting No change in patients sleepiness.   [place] Pharmacist Assistant [phone]

## 2022-01-22 DIAGNOSIS — Z7984 Long term (current) use of oral hypoglycemic drugs: Secondary | ICD-10-CM | POA: Diagnosis not present

## 2022-01-22 DIAGNOSIS — I10 Essential (primary) hypertension: Secondary | ICD-10-CM

## 2022-01-22 DIAGNOSIS — E1159 Type 2 diabetes mellitus with other circulatory complications: Secondary | ICD-10-CM

## 2022-01-22 DIAGNOSIS — G20C Parkinsonism, unspecified: Secondary | ICD-10-CM | POA: Diagnosis not present

## 2022-01-22 DIAGNOSIS — I2510 Atherosclerotic heart disease of native coronary artery without angina pectoris: Secondary | ICD-10-CM | POA: Diagnosis not present

## 2022-01-22 DIAGNOSIS — E785 Hyperlipidemia, unspecified: Secondary | ICD-10-CM | POA: Diagnosis not present

## 2022-01-23 ENCOUNTER — Telehealth: Payer: Self-pay | Admitting: Family Medicine

## 2022-01-23 MED ORDER — AMLODIPINE BESYLATE 2.5 MG PO TABS: 2.5000 mg | ORAL_TABLET | Freq: Every day | ORAL | 5 refills | Status: DC

## 2022-01-23 NOTE — Telephone Encounter (Signed)
Called patient's spouse to make her aware of the medication change. Patient's wife verbalized her understanding and will go get the amlodipine from [facility] this afternoon. She is aware to keep on checking his blood pressures daily or every other day and will follow up on his readings in 1.5-2 weeks. Patient's wife will call if he has any dizziness or readings < 110/60.

## 2022-01-23 NOTE — Telephone Encounter (Signed)
He has been getting high BP's at home

## 2022-01-28 ENCOUNTER — Telehealth: Payer: Self-pay | Admitting: *Deleted

## 2022-01-28 NOTE — Patient Outreach (Signed)
  Care Coordination   01/28/2022 Name: Joe Murray MRN: [id number] DOB: 1940/03/26   Care Coordination Outreach Attempts:  An unsuccessful telephone outreach was attempted today to offer the patient information about available care coordination services as a benefit of their health plan.   Follow Up Plan:  Additional outreach attempts will be made to offer the patient care coordination information and services.   Encounter Outcome:  No Answer   Care Coordination Interventions:  No, not indicated    Raina Mina, RN Care Management Coordinator [place] Office [phone]

## 2022-02-02 ENCOUNTER — Telehealth: Payer: Self-pay | Admitting: Pharmacist

## 2022-02-02 NOTE — Chronic Care Management (AMB) (Signed)
    Chronic Care Management Pharmacy Assistant   Name: CAP MASSI  MRN: [id number] DOB: [birth year]  Reason for Encounter: Follow up blood pressures   Patient recently started Amlodipine.  Spoke with patients wife, she states he is taking amlodipine in the morning at breakfast, he is tolerating amlodipine well, she denies any side effects.  She has been checking his blood pressure at various times of the day.  His recent readings have been:  01/23/22 - 157/77 - 6-8 hours after meds 01/24/22 - 180/78 - before taking amlodipine 01/25/22 - 151/72 - before taking amlodipine 01/26/22 - 138/72 - 1-3 hrs after amlodipine 01/28/22 - 113/57 - in the afternoon  01/29/22 - 124/74 - 1-3 hours after amlodipine 01/30/22 - 127/69 - 1-3 hours after amlodipine 01/31/22 - 160/68 - around bedtime 02/01/22 - 119/68 - 1-3 hours after breakfast  02/02/22 - 120/60 - 1-3 hours after breakfast  [place] Pharmacist Assistant [phone]

## 2022-02-06 ENCOUNTER — Ambulatory Visit (INDEPENDENT_AMBULATORY_CARE_PROVIDER_SITE_OTHER): Payer: Medicare HMO | Admitting: Family

## 2022-02-06 ENCOUNTER — Encounter: Payer: Self-pay | Admitting: Family

## 2022-02-06 VITALS — BP 120/62 | HR 55 | Temp 97.5°F | Ht 70.0 in | Wt 215.0 lb

## 2022-02-06 DIAGNOSIS — R350 Frequency of micturition: Secondary | ICD-10-CM

## 2022-02-06 LAB — POCT URINALYSIS DIPSTICK
Bilirubin, UA: NEGATIVE
Blood, UA: NEGATIVE
Glucose, UA: NEGATIVE
Ketones, UA: NEGATIVE
Leukocytes, UA: NEGATIVE
Nitrite, UA: NEGATIVE
Protein, UA: NEGATIVE
Spec Grav, UA: 1.025 (ref 1.010–1.025)
Urobilinogen, UA: 0.2 E.U./dL
pH, UA: 6 (ref 5.0–8.0)

## 2022-02-06 NOTE — Progress Notes (Signed)
Patient ID: Joe Murray, male    DOB: [birth year], 81 y.o.   MRN: [id number]  Chief Complaint  Patient presents with   Urinary Frequency    HPI:    Urinary frequency:   Pt c/o urinary frequency and he has been urinating on himself and unable to make it to the bathroom In time. Pt has Parkinson's & dementia. cg with him today (wife?) states her dt in law is in the medical profession and thought it wise to get him checked for possible UTI as she feels his confusion has worsened recently, they also report he has urinary hesitancy during the day, gets up 1 time during night to urinate, & this has not worsened.      Assessment & Plan:  1. Urinary frequency - UA neg. Advosed pt and wife to increase water intake, she states he mostly drinks decaf coffee during day.  Call back if any worsening of sx, fever, etc. May want to talk to PCP about possible BPH contributing to sx.  - POCT Urinalysis Dipstick  Subjective:    Outpatient Medications Prior to Visit  Medication Sig Dispense Refill   amLODipine (NORVASC) 2.5 MG tablet Take 1 tablet (2.5 mg total) by mouth daily. 30 tablet 5   aspirin EC 81 MG tablet Take 81 mg by mouth daily.     carbidopa-levodopa (SINEMET IR) 25-100 MG tablet Take 1 tablet three times a day with meals 270 tablet 3   glipiZIDE (GLUCOTROL) 5 MG tablet Take 1 tablet (5 mg total) by mouth 2 (two) times daily before a meal. 180 tablet 3   Lancets (BD LANCET ULTRAFINE 30G) MISC USE ONE  TO CHECK GLUCOSE ONCE DAILY 100 each 1   MAGNESIUM CHLORIDE PO Take 250 mg by mouth daily.     memantine (NAMENDA) 10 MG tablet Take 1 tablet by mouth twice daily 180 tablet 3   nitroGLYCERIN (NITROSTAT) 0.4 MG SL tablet PLACE 1 TABLET UNDER THE TONGUE EVERY 5 MINUTES FOR 3 DOSES AS NEEDED FOR CHEST PAIN 25 tablet 6   ONETOUCH ULTRA test strip USE TO TEST DAILY 50 each 0   POTASSIUM GLUCONATE PO Take 99 mg by mouth daily.     prednisoLONE acetate (PRED FORTE) 1 % ophthalmic suspension  Place 1 drop into the right eye daily.     [place] 420 MG/3.5ML SOCT INJECT 420 MG INTO SKIN EVERY 30 DAYS 3.6 mL 11   vitamin B-12 (CYANOCOBALAMIN) 1000 MCG tablet Take 1,000 mcg by mouth daily.     No facility-administered medications prior to visit.   Past Medical History:  Diagnosis Date   Alzheimer's dementia [facility])    sees Dr. Ellouise Newer   CAD S/P percutaneous coronary angioplasty 10/'03; 3/'09; 7/*09; 1/'17    a. Dr. Ellyn Hack; '03 - Cypher DES 3.0 mm 33 mm proximal-mid LAD (details D1 with ostial 60%); 3/'09 dRCA 2.75 mm x 13 mm Cypher DES (2.8 mm); 7/'09 pRCA 3.0 mm x 18 m Cypher DES (3.25 mm);; (2009) 2D Echo - EF 55%, (November 2014) nonischemic Myoview;; b. Promus DES to RCA and mid LAD 02/26/2015   Chronotropic incompetence 12/2011   ETT 11/2017: Normal blood pressure response.  No EKG changes.  Exercise stopped due to fatigue and dyspnea.  Heart rate increased to low 90s from 50s.  Suggest chronotropic incompetence.  Severely impaired exercise capacity. -->  Results reviewed with electrophysiology: Still not indication for pacemaker.   Diabetes mellitus type 2 in obese ([place])  CAD   Diverticulosis    Dyslipidemia, goal LDL below 70    on [employer] study medicaion.   ED (erectile dysfunction)    GERD (gastroesophageal reflux disease)    Hemorrhoids 07/2002   Internal and External   Insomnia    Nodular basal cell carcinoma (BCC) 05/08/2019   left ear (cx34f)   Non-STEMI (non-ST elevated myocardial infarction) ([facility] 11/2001   a. Proximal LAD tandem lesions -- long DES stent covering both;; b. Jan 2017: mRCA PCI, mLAD PCI   Parkinson's disease    sees Dr. KEllouise Newer  Persistent sinus bradycardia    Personal history of colonic adenomas 09/06/2012   Psoriasis    Resting HR in 54s   SCC (squamous cell carcinoma) 11/13/2001   right upper outer arm (txpbx)   SCC (squamous cell carcinoma) 11/13/2009   right upper ear rim ([id number]   SCC (squamous cell  carcinoma) 01/20/2010   right nose ([id number]  SCC (squamous cell carcinoma) 03/28/2014   right side of nose ( [id number] SCC (squamous cell carcinoma) 06/03/2016   right side of nose   SCC (squamous cell carcinoma) 05/08/2019   right ear inferior ([id number]   SCCA (squamous cell carcinoma) of skin 04/23/2021   Right Temporal Scalp (well diff) (tx p bx)   Spasm of esophagus    Squamous cell carcinoma in situ (SCCIS) 05/08/2019   Right Ear Inferior   Squamous cell carcinoma of skin 11/08/2001   top right rim ear    SVT (supraventricular tachycardia) 2003   Vertigo, benign positional    Past Surgical History:  Procedure Laterality Date   CARDIAC CATHETERIZATION N/A 02/26/2015   Procedure: Left Heart Cath and Coronary Angiography;  Surgeon: DavidLeonie Man mRCA 99% --> PCI. mLAD 75%-> FFR Guided PCI, Mod ISR in pLAD & pRCA   CARDIAC CATHETERIZATION N/A 02/26/2015   Procedure: Coronary Stent Intervention;  Surgeon: DavidLeonie Man  Location: [facility] INVASIVE CV LAB: mRCA Promus Premier DES 3.0 x 12 (3.5), mLAD Promus Premier DES 2.75 x 20 (3.0)   CARDIAC CATHETERIZATION N/A 02/26/2015   Procedure: Intravascular Pressure Wire/FFR Study;  Surgeon: DavidLeonie Man  [facility] [place];  Service: Cardiovascular: LAD 75% - FFR + -> PCI   CATARACT EXTRACTION  [id number]  12/16/2017   per Dr. GessnCarlean Purlnomatous polyps, no repeats due to age    [place] - 2003, RCA 3 & 7/ '09   Cypher 3.0 x 32 mid LAD; Cyper 2.75 x 13 - distal RCA, 3.0 x 18 Prox RCA   ESOPHAGOGASTRODUODENOSCOPY  08-11-02   esophageal dilation per Dr. GessnCarlean PurlRNIA REPAIR     right inguinal    NM MYOVIEW LTD  Nov 2014   ~8 METS; EF 60%, no ischemia or infarction   PERCUTANEOUS CORONARY STENT INTERVENTION (PCI-S)  11/23/2001   NSTEMI: Prox-Mid LAD tandem ~80% lesions on either side of D1 (with 80% lesion) -- Cypher DES 3.0 mm x 33 m (covering both lesions)     PERCUTANEOUS CORONARY STENT INTERVENTION (PCI-S)  04/27/2007   Unstable Angina: Distal RCA 95%: Cypher 2.75x13  (2.8 mm); residual focal ~60%ISR in LAD stent, 60% D1 ostial (no PTCA on LAD due to no ischemia on ST)   PERCUTANEOUS CORONARY STENT INTERVENTION (PCI-S)  09/21/2007   Bradycardia & Unstable Angina: Prox RCA 70% - PCI Cypher DES 3.0 mm x 18 mm  (3.25  mm); ostial 60-70% jailed D1. LAD & distal RCA stents patent   SHOULDER SURGERY     left rotator cuff, Dr. Gladstone Lighter   TRANSTHORACIC ECHOCARDIOGRAM  March 2009   Normal LV size and function, EF 55%. Mild MR; mild RV dilation.   TRANSTHORACIC ECHOCARDIOGRAM  02/2015   EF 60-65%. Moderate concentric LVH. Normal function with normal regional wall motion. GR 1 DD   WRIST FRACTURE SURGERY     right   Allergies  Allergen Reactions   Beta Adrenergic Blockers Other (See Comments)    chronotropic incompetence   Praluent [Alirocumab]     Diffuse Rash - improved when stopping & taking Benadryl   Sulfonamide Derivatives     unknown   Statins Other (See Comments)    REACTION: myalgias      Objective:    Physical Exam Vitals and nursing note reviewed.  Constitutional:      General: He is not in acute distress.    Appearance: Normal appearance.  HENT:     Head: Normocephalic.  Cardiovascular:     Rate and Rhythm: Normal rate and regular rhythm.  Pulmonary:     Effort: Pulmonary effort is normal.     Breath sounds: Normal breath sounds.  Musculoskeletal:        General: Normal range of motion.     Cervical back: Normal range of motion.  Skin:    General: Skin is warm and dry.  Neurological:     Mental Status: He is alert and oriented to person, place, and time.     Motor: Weakness present.     Coordination: Coordination abnormal.     Gait: Gait abnormal.  Psychiatric:        Mood and Affect: Mood normal.    BP 120/62 (BP Location: Right Arm, Patient Position: Sitting, Cuff Size: Large)   Pulse (!) 55   Temp (!) 97.5 F (36.4  C) (Temporal)   Ht '5\' 10"'$  (1.778 m)   Wt 215 lb (97.5 kg)   SpO2 97%   BMI 30.85 kg/m  Wt Readings from Last 3 Encounters:  02/06/22 215 lb (97.5 kg)  11/11/21 212 lb (96.2 kg)  08/27/21 216 lb (98 kg)      Jeanie Sewer, NP

## 2022-02-26 NOTE — Progress Notes (Signed)
Assessment/Plan:   Dementia likely due to Alzheimer's Disease   Joe Murray is a very pleasant 82 y.o. RH male  with a history of hypertension, hyperlipidemia, diabetes, CAD, with mild dementia likely due to Alzheimer's disease with Lewy body component seen today in follow up for memory loss. Patient is currently on memantine 10 mg twice daily.  During his last visit on 08/27/2021, the patient was noted to have increasing bradykinesia and gait instability, and Sinemet was increased to 1 tab 3 times daily with meals, tolerating well; he has finished PT as well, but did not report any significant benefit, not interested in adding more sessions. He has increased daytime drowsiness, again we discussed doing a sleep study but he declined. Of note, he takes several naps during the day and takes coffee at night which ay contribute to his sleep changes. From the cognitive standpoint, he is stable.  .    Follow up in 6  months. Continue close supervision Continue memantine 10 mg twice daily Continue Sinemet 1 tab 3 times daily with meals Recommend sleep study, no caffeine at night, no further naps after 2 pm  Continue to control mood as per PCP Recommend good control of cardiovascular risk factors.       Subjective:    This patient is accompanied in the office by his wife who supplements the history.  Previous records as well as any outside records available were reviewed prior to todays visit. Patient was last seen on 08/27/2021 at which time his MMSE was 23/30.  Any changes in memory since last visit? " Memory about the same, but the confusion may be slightly worse in the morning, or when waking up from the nap" . repeats oneself?  Endorsed, "sometimes, especially asking about appointments". Disoriented when walking into a room?  Endorsed, when waking up from the nap   Wandering behavior?  denies   Any personality changes since last visit?  denies   Any worsening depression?:  denies    Hallucinations or paranoia?  Sometimes he sees something when he wakes up, such as "do you recognize that car? (There is no car) " Seizures?    denies    Any sleep changes?  He has daytime sleepiness. "He wakes up during the night. He goes to smoke around 2 am and leaves the back door is open".  He endorsed vivid dreams, talks in his sleep. No sleepwalking  Sleep apnea?" He was tested one time, it was not bad, but he does not snore"  Any hygiene concerns?  denies   Independent of bathing and dressing?  He needs assistance, wife helps him dressing and bathing " to speed up the process because he cannot figure how to put the T shirt on ".   Does the patient needs help with medications?  Wife is in charge   Who is in charge of the finances?  Wife is in charge     Any changes in appetite?  denies " he eats well"    Patient have trouble swallowing?  denies   Does the patient cook?  No Any kitchen accidents such as leaving the stove on? Patient denies   Any headaches?  No  Chronic back pain  denies   Ambulates with difficulty? He moves slowly.  He continues to have some " R leg shakes "when trying to get on the lift chair without changes" Recent falls or head injuries?  2 times in the last 2 weeks, mechanical, "when his  foot got tangled in the walker", no LOC or head injury  Unilateral weakness, numbness or tingling?  denies   Any tremors?  denies   Any anosmia?  Patient denies   Any incontinence of urine?  Endorsed, he does not make it to the bathroom fast. .  Any bowel dysfunction?  Chronic constipation  Patient lives  with wife Does the patient drive?  He no longer drives.    History on Initial Assessment 04/06/2018: This is a 82 year old right-handed man with a history of hypertension, hyperlipidemia, CAD, diabetes, presenting for evaluation of memory loss. He reports his memory is lousy. He started noticing memory changes around a year ago. His wife feels memory changes started a few years  ago which she attributes to statin use, however in the past year, forgetfulness has worsened. He watches a lot of Westerns but would watch the same show 1-2 weeks later, his wife reminds him he has seen it already but he denies it. He forgets conversations. He does not remember people from the past, someone would pass away and he would not remember then. No significant difficulties following directions/instructions. His wife has not noticed significant memory decline in the past year. He drives minimally due to vision issues, and denies getting lost. His wife manages finances. He was previously on several medications that were stopped 2 months ago due to fatigue and memory concerns. He is only taking glipizide. He had muscle pains on statin in the past, his wife feels the Repatha helped improve his cholesterol levels but also caused leg pain. He reports right leg pain mostly after walking a few steps, improving when he rests. His wife has noticed slowed movements, he would be very slow when opening things. He has a little trouble getting out of a chair and feels his balance is off when he first stands, but feels fine once he starts walking. His wife  has noticed over the past year that gait has slowed down and his stride has been shorter. He speaks in a low voice and does not move his mouth much when talking, his wife reports he has always been this way. No major personality changes, no paranoia or hallucinations. No family history of dementia, no history of significant head injuries. He drinks alcohol once a week.   He has had headaches over the past year localized over the back of his left ear. He describes a throbbing pain that only occurs in the evening around 30-40 minutes after he sits in his recliner. It would start wearing off after a few hours and resolves by bedtime. He takes Tylenol every evening. No associated nausea/vomiting, photo/phonophobia. He has trouble seeing the words on TV and reports  monocular diplopia in his right eye. He denies any dizziness, dysarthria/dysphagia, neck/back pain, focal numbness/tingling, bowel/bladder dysfunction, anosmia, no falls. Sleep is good, he has some daytime drowsiness and snores minimally. No REM behavior disorder noted by wife. He has had occasional left hand tremors the past year, he denies any difficulty writing with his right hand or using utensils. He is a retired Systems developer.    Diagnostic Data:  TSH normal, B12 low normal 312.  MRI brain without contrast done 03/2018 did not show any acute changes. There was mild to moderate diffuse atrophy and chronic microvascular disease.    Neuropsychological evaluation in 01/2020 indicated memory storage type problems, executive dysfunction, diminished processing efficiency, likely due to Alzheimer's disease. His parkinsonism suggests there may be a Lewy Body spectrum  component.   PREVIOUS MEDICATIONS:   CURRENT MEDICATIONS:  Outpatient Encounter Medications as of 02/27/2022  Medication Sig   amLODipine (NORVASC) 2.5 MG tablet Take 1 tablet (2.5 mg total) by mouth daily.   aspirin EC 81 MG tablet Take 81 mg by mouth daily.   carbidopa-levodopa (SINEMET IR) 25-100 MG tablet Take 1 tablet three times a day with meals   glipiZIDE (GLUCOTROL) 5 MG tablet Take 1 tablet (5 mg total) by mouth 2 (two) times daily before a meal.   Lancets (BD LANCET ULTRAFINE 30G) MISC USE ONE  TO CHECK GLUCOSE ONCE DAILY   MAGNESIUM CHLORIDE PO Take 250 mg by mouth daily.   memantine (NAMENDA) 10 MG tablet Take 1 tablet by mouth twice daily   nitroGLYCERIN (NITROSTAT) 0.4 MG SL tablet PLACE 1 TABLET UNDER THE TONGUE EVERY 5 MINUTES FOR 3 DOSES AS NEEDED FOR CHEST PAIN   ONETOUCH ULTRA test strip USE TO TEST DAILY   POTASSIUM GLUCONATE PO Take 99 mg by mouth daily.   prednisoLONE acetate (PRED FORTE) 1 % ophthalmic suspension Place 1 drop into the right eye daily.   [place] 420 MG/3.5ML SOCT INJECT  420 MG INTO SKIN EVERY 30 DAYS   vitamin B-12 (CYANOCOBALAMIN) 1000 MCG tablet Take 1,000 mcg by mouth daily.   No facility-administered encounter medications on file as of 02/27/2022.       08/27/2021   10:00 AM 02/21/2020   11:00 AM 12/28/2019    9:00 AM  MMSE - Mini Mental State Exam  Orientation to time '4 3 4  '$ Orientation to Place '5 5 5  '$ Registration '3 3 3  '$ Attention/ Calculation '2 3 5  '$ Recall 2 1 0  Language- name 2 objects '2 2 2  '$ Language- repeat '1 1 1  '$ Language- follow 3 step command '3 3 3  '$ Language- read & follow direction '1 1 1  '$ Write a sentence 0 1 1  Copy design 0 1 1  Total score '23 24 26      '$ 11/11/2018   11:00 AM 04/06/2018    9:00 AM  Montreal Cognitive Assessment   Visuospatial/ Executive (0/5)  4  Naming (0/3)  3  Attention: Read list of digits (0/2) 2 2  Attention: Read list of letters (0/1) 1 1  Attention: Serial 7 subtraction starting at 100 (0/3) 3 3  Language: Repeat phrase (0/2) 1 1  Language : Fluency (0/1) 0 0  Abstraction (0/2) 1 2  Delayed Recall (0/5) 0 2  Orientation (0/6) 5 6  Total  24  Adjusted Score (based on education)  25    Objective:     PHYSICAL EXAMINATION:    VITALS:   Vitals:   02/27/22 0845  BP: 136/75  Pulse: 68  Resp: 20  SpO2: 100%  Weight: 217 lb (98.4 kg)  Height: '5\' 8"'$  (1.727 m)    GEN:  The patient appears stated age and is in NAD. HEENT:  Normocephalic, atraumatic.   Neurological examination:  General: NAD, well-groomed, appears stated age. Orientation: The patient is alert. Oriented to person, place and not to date Cranial nerves: There is good facial symmetry. Hypomimia noted .The speech is fluent and clear, voice is soft.. No aphasia or dysarthria. Fund of knowledge is appropriate. Recent and remote memory are impaired. Attention and concentration are reduced.  Able to name objects and repeat phrases.  Hearing is intact to conversational tone.    Sensation: Sensation is intact to light touch  throughout Motor:  Strength is at least antigravity x4. DTR's 2/4 in UE/LE     Movement examination: Tone: There is normal tone in the UE/LE Abnormal movements: Tremor is noted, right greater than left, with mild cogwheeling.  No myoclonus.  No asterixis.   Coordination:  There is no decremation with RAM's. Normal finger to nose  Gait and Station: The patient has no difficulty arising out of a deep-seated chair without the use of the hands.  He has flexed forward posture, diminished swing, the patient's stride length is short.  Gait is cautious and narrow.    Thank you for allowing [place] the opportunity to participate in the care of this nice patient. Please do not hesitate to contact us for any questions or concerns.   Total time spent on today's visit was 30 minutes dedicated to this patient today, preparing to see patient, examining the patient, ordering tests and/or medications and counseling the patient, documenting clinical information in the EHR or other health record, independently interpreting results and communicating results to the patient/family, discussing treatment and goals, answering patient's questions and coordinating care.  Cc:  Laurey Morale, MD  Sharene Butters 02/27/2022 9:33 AM

## 2022-02-27 ENCOUNTER — Ambulatory Visit: Payer: Medicare HMO | Admitting: Physician Assistant

## 2022-02-27 ENCOUNTER — Encounter: Payer: Self-pay | Admitting: Physician Assistant

## 2022-02-27 VITALS — BP 136/75 | HR 68 | Resp 20 | Ht 68.0 in | Wt 217.0 lb

## 2022-02-27 DIAGNOSIS — R69 Illness, unspecified: Secondary | ICD-10-CM | POA: Diagnosis not present

## 2022-02-27 DIAGNOSIS — F02818 Dementia in other diseases classified elsewhere, unspecified severity, with other behavioral disturbance: Secondary | ICD-10-CM

## 2022-02-27 DIAGNOSIS — G301 Alzheimer's disease with late onset: Secondary | ICD-10-CM

## 2022-02-27 NOTE — Patient Instructions (Addendum)
Good to see you.  Continue Memantine '10mg'$  twice a day  2.Continue Sinemet (Carbidopa/Levodopa) 25/'100mg'$ : take 1 tablet three times a day with meals  No coffee at night or the light needs to be off !  4. Follow-up in 6 months, call for any changes                                        FALL PRECAUTIONS: Be cautious when walking. Scan the area for obstacles that may increase the risk of trips and falls. When getting up in the mornings, sit up at the edge of the bed for a few minutes before getting out of bed. Consider elevating the bed at the head end to avoid drop of blood pressure when getting up. Walk always in a well-lit room (use night lights in the walls). Avoid area rugs or power cords from appliances in the middle of the walkways. Use a walker or a cane if necessary and consider physical therapy for balance exercise. Get your eyesight checked regularly.  HOME SAFETY: Consider the safety of the kitchen when operating appliances like stoves, microwave oven, and blender. Consider having supervision and share cooking responsibilities until no longer able to participate in those. Accidents with firearms and other hazards in the house should be identified and addressed as well.  ABILITY TO BE LEFT ALONE: If patient is unable to contact 911 operator, consider using LifeLine, or when the need is there, arrange for someone to stay with patients. Smoking is a fire hazard, consider supervision or cessation. Risk of wandering should be assessed by caregiver and if detected at any point, supervision and safe proof recommendations should be instituted.    RECOMMENDATIONS FOR ALL PATIENTS WITH MEMORY PROBLEMS: 1. Continue to exercise (Recommend 30 minutes of walking everyday, or 3 hours every week) 2. Increase social interactions - continue going to [place] and enjoy social gatherings with friends and family 3. Eat healthy, avoid fried foods and eat more fruits and  vegetables 4. Maintain adequate blood pressure, blood sugar, and blood cholesterol level. Reducing the risk of stroke and cardiovascular disease also helps promoting better memory. 5. Avoid stressful situations. Live a simple life and avoid aggravations. Organize your time and prepare for the next day in anticipation. 6. Sleep well, avoid any interruptions of sleep and avoid any distractions in the bedroom that may interfere with adequate sleep quality 7. Avoid sugar, avoid sweets as there is a strong link between excessive sugar intake, diabetes, and cognitive impairment The Mediterranean diet has been shown to help patients reduce the risk of progressive memory disorders and reduces cardiovascular risk. This includes eating fish, eat fruits and green leafy vegetables, nuts like almonds and hazelnuts, walnuts, and also use olive oil. Avoid fast foods and fried foods as much as possible. Avoid sweets and sugar as sugar use has been linked to worsening of memory function.  There is always a concern of gradual progression of memory problems. If this is the case, then we may need to adjust level of care according to patient needs. Support, both to the patient and caregiver, should then be put into place. Dr.

## 2022-03-05 ENCOUNTER — Other Ambulatory Visit: Payer: Self-pay | Admitting: Cardiology

## 2022-03-05 DIAGNOSIS — I2510 Atherosclerotic heart disease of native coronary artery without angina pectoris: Secondary | ICD-10-CM

## 2022-03-05 DIAGNOSIS — I214 Non-ST elevation (NSTEMI) myocardial infarction: Secondary | ICD-10-CM

## 2022-03-05 NOTE — Telephone Encounter (Signed)
Forwarded to [employer] Pool 

## 2022-03-16 ENCOUNTER — Other Ambulatory Visit (HOSPITAL_COMMUNITY): Payer: Self-pay

## 2022-03-24 ENCOUNTER — Telehealth: Payer: Self-pay | Admitting: Family Medicine

## 2022-03-24 NOTE — Telephone Encounter (Signed)
Patient wife dropped off document to be filled out by provider Handicap Placard. Patient requested to send it via Mail within 5-days. Document is located in providers tray at front office.   Please advise.

## 2022-03-26 NOTE — Telephone Encounter (Signed)
Spoke with pt wife advised that pt disability Placard was mailed out today morning to the address provided on the envelope, verbalized understanding

## 2022-03-30 ENCOUNTER — Ambulatory Visit (INDEPENDENT_AMBULATORY_CARE_PROVIDER_SITE_OTHER): Payer: Medicare HMO

## 2022-03-30 VITALS — Ht 68.0 in | Wt 217.0 lb

## 2022-03-30 DIAGNOSIS — Z0000 Encounter for general adult medical examination without abnormal findings: Secondary | ICD-10-CM | POA: Diagnosis not present

## 2022-03-30 NOTE — Patient Instructions (Addendum)
Joe Murray , Thank you for taking time to come for your Medicare Wellness Visit. I appreciate your ongoing commitment to your health goals. Please review the following plan we discussed and let me know if I can assist you in the future.   These are the goals we discussed:  Goals       Exercise 3x per week (30 min per time) (pt-stated)      Would like to walk better.      No current goals (pt-stated)      Track and Manage My Blood Pressure-Hypertension      Timeframe:  Long-Range Goal Priority:  Medium Start Date:                             Expected End Date:                       Follow Up Date 03/24/21    - check blood pressure weekly - choose a place to take my blood pressure (home, clinic or office, retail store) - write blood pressure results in a log or diary    Why is this important?   You won't feel high blood pressure, but it can still hurt your blood vessels.  High blood pressure can cause heart or kidney problems. It can also cause a stroke.  Making lifestyle changes like losing a little weight or eating less salt will help.  Checking your blood pressure at home and at different times of the day can help to control blood pressure.  If the doctor prescribes medicine remember to take it the way the doctor ordered.  Call the office if you cannot afford the medicine or if there are questions about it.     Notes:         This is a list of the screening recommended for you and due dates:  Health Maintenance  Topic Date Due   DTaP/Tdap/Td vaccine (1 - Tdap) Never done   COVID-19 Vaccine (7 - 2023-24 season) 03/31/2022   Yearly kidney health urinalysis for diabetes  03/31/2022*   Complete foot exam   03/31/2022*   Hemoglobin A1C  05/12/2022   Eye exam for diabetics  09/20/2022   Yearly kidney function blood test for diabetes  11/12/2022   Medicare Annual Wellness Visit  03/31/2023   Pneumonia Vaccine  Completed   Flu Shot  Completed   Zoster (Shingles) Vaccine   Completed   HPV Vaccine  Aged Out  *Topic was postponed. The date shown is not the original due date.    Advanced directives: Please bring a copy of your health care power of attorney and living will to the office to be added to your chart at your convenience.   Conditions/risks identified: None  Next appointment: Follow up in one year for your annual wellness visit.    Preventive Care 83 Years and Older, Male  Preventive care refers to lifestyle choices and visits with your health care provider that can promote health and wellness. What does preventive care include? A yearly physical exam. This is also called an annual well check. Dental exams once or twice a year. Routine eye exams. Ask your health care provider how often you should have your eyes checked. Personal lifestyle choices, including: Daily care of your teeth and gums. Regular physical activity. Eating a healthy diet. Avoiding tobacco and drug use. Limiting alcohol use. Practicing safe sex. Taking low  doses of aspirin every day. Taking vitamin and mineral supplements as recommended by your health care provider. What happens during an annual well check? The services and screenings done by your health care provider during your annual well check will depend on your age, overall health, lifestyle risk factors, and family history of disease. Counseling  Your health care provider may ask you questions about your: Alcohol use. Tobacco use. Drug use. Emotional well-being. Home and relationship well-being. Sexual activity. Eating habits. History of falls. Memory and ability to understand (cognition). Work and work Statistician. Screening  You may have the following tests or measurements: Height, weight, and BMI. Blood pressure. Lipid and cholesterol levels. These may be checked every 5 years, or more frequently if you are over 48 years old. Skin check. Lung cancer screening. You may have this screening every year  starting at age 52 if you have a 30-pack-year history of smoking and currently smoke or have quit within the past 15 years. Fecal occult blood test (FOBT) of the stool. You may have this test every year starting at age 57. Flexible sigmoidoscopy or colonoscopy. You may have a sigmoidoscopy every 5 years or a colonoscopy every 10 years starting at age 48. Prostate cancer screening. Recommendations will vary depending on your family history and other risks. Hepatitis C blood test. Hepatitis B blood test. Sexually transmitted disease (STD) testing. Diabetes screening. This is done by checking your blood sugar (glucose) after you have not eaten for a while (fasting). You may have this done every 1-3 years. Abdominal aortic aneurysm (AAA) screening. You may need this if you are a current or former smoker. Osteoporosis. You may be screened starting at age 21 if you are at high risk. Talk with your health care provider about your test results, treatment options, and if necessary, the need for more tests. Vaccines  Your health care provider may recommend certain vaccines, such as: Influenza vaccine. This is recommended every year. Tetanus, diphtheria, and acellular pertussis (Tdap, Td) vaccine. You may need a Td booster every 10 years. Zoster vaccine. You may need this after age 27. Pneumococcal 13-valent conjugate (PCV13) vaccine. One dose is recommended after age 58. Pneumococcal polysaccharide (PPSV23) vaccine. One dose is recommended after age 41. Talk to your health care provider about which screenings and vaccines you need and how often you need them. This information is not intended to replace advice given to you by your health care provider. Make sure you discuss any questions you have with your health care provider. Document Released: 03/08/2015 Document Revised: 10/30/2015 Document Reviewed: 12/11/2014 Elsevier Interactive Patient Education  2017 [place] Prevention in the  Home Falls can cause injuries. They can happen to people of all ages. There are many things you can do to make your home safe and to help prevent falls. What can I do on the outside of my home? Regularly fix the edges of walkways and driveways and fix any cracks. Remove anything that might make you trip as you walk through a door, such as a raised step or threshold. Trim any bushes or trees on the path to your home. Use bright outdoor lighting. Clear any walking paths of anything that might make someone trip, such as rocks or tools. Regularly check to see if handrails are loose or broken. Make sure that both sides of any steps have handrails. Any raised decks and porches should have guardrails on the edges. Have any leaves, snow, or ice cleared regularly. Use sand or  salt on walking paths during winter. Clean up any spills in your garage right away. This includes oil or grease spills. What can I do in the bathroom? Use night lights. Install grab bars by the toilet and in the tub and shower. Do not use towel bars as grab bars. Use non-skid mats or decals in the tub or shower. If you need to sit down in the shower, use a plastic, non-slip stool. Keep the floor dry. Clean up any water that spills on the floor as soon as it happens. Remove soap buildup in the tub or shower regularly. Attach bath mats securely with double-sided non-slip rug tape. Do not have throw rugs and other things on the floor that can make you trip. What can I do in the bedroom? Use night lights. Make sure that you have a light by your bed that is easy to reach. Do not use any sheets or blankets that are too big for your bed. They should not hang down onto the floor. Have a firm chair that has side arms. You can use this for support while you get dressed. Do not have throw rugs and other things on the floor that can make you trip. What can I do in the kitchen? Clean up any spills right away. Avoid walking on wet  floors. Keep items that you use a lot in easy-to-reach places. If you need to reach something above you, use a strong step stool that has a grab bar. Keep electrical cords out of the way. Do not use floor polish or wax that makes floors slippery. If you must use wax, use non-skid floor wax. Do not have throw rugs and other things on the floor that can make you trip. What can I do with my stairs? Do not leave any items on the stairs. Make sure that there are handrails on both sides of the stairs and use them. Fix handrails that are broken or loose. Make sure that handrails are as long as the stairways. Check any carpeting to make sure that it is firmly attached to the stairs. Fix any carpet that is loose or worn. Avoid having throw rugs at the top or bottom of the stairs. If you do have throw rugs, attach them to the floor with carpet tape. Make sure that you have a light switch at the top of the stairs and the bottom of the stairs. If you do not have them, ask someone to add them for you. What else can I do to help prevent falls? Wear shoes that: Do not have high heels. Have rubber bottoms. Are comfortable and fit you well. Are closed at the toe. Do not wear sandals. If you use a stepladder: Make sure that it is fully opened. Do not climb a closed stepladder. Make sure that both sides of the stepladder are locked into place. Ask someone to hold it for you, if possible. Clearly mark and make sure that you can see: Any grab bars or handrails. First and last steps. Where the edge of each step is. Use tools that help you move around (mobility aids) if they are needed. These include: Canes. Walkers. Scooters. Crutches. Turn on the lights when you go into a dark area. Replace any light bulbs as soon as they burn out. Set up your furniture so you have a clear path. Avoid moving your furniture around. If any of your floors are uneven, fix them. If there are any pets around you, be aware of  where they are. Review your medicines with your doctor. Some medicines can make you feel dizzy. This can increase your chance of falling. Ask your doctor what other things that you can do to help prevent falls. This information is not intended to replace advice given to you by your health care provider. Make sure you discuss any questions you have with your health care provider. Document Released: 12/06/2008 Document Revised: 07/18/2015 Document Reviewed: 03/16/2014 Elsevier Interactive Patient Education  2017 [employer].

## 2022-03-30 NOTE — Progress Notes (Addendum)
Subjective:   Joe Murray is a 82 y.o. male who presents for Medicare Annual/Subsequent preventive examination.  Review of Systems    Virtual Visit via Telephone Note  I connected with  [name] on 04/01/22 at 11:00 AM EST by telephone and verified that I am speaking with the correct person using two identifiers.  Location: Patient: Home Provider: Office Persons participating in the virtual visit: patient/Nurse Health Advisor   I discussed the limitations, risks, security and privacy concerns of performing an evaluation and management service by telephone and the availability of in person appointments. The patient expressed understanding and agreed to proceed.  Interactive audio and video telecommunications were attempted between this nurse and patient, however failed, due to patient having technical difficulties OR patient did not have access to video capability.  We continued and completed visit with audio only.  Some vital signs may be absent or patient reported.   Criselda Peaches, LPN  Cardiac Risk Factors include: advanced age (>24mn, >>16women);diabetes mellitus;male gender;dyslipidemia;hypertension     Objective:    Today's Vitals   03/30/22 1108 03/30/22 1109  Weight: 217 lb (98.4 kg)   Height: '5\' 8"'$  (1.727 m)   PainSc:  0-No pain   Body mass index is 32.99 kg/m.     03/30/2022   11:21 AM 02/27/2022    8:51 AM 09/04/2021    9:34 AM 08/27/2021    9:57 AM 03/27/2021   11:17 AM 03/11/2021    9:00 AM 09/04/2020    8:24 AM  Advanced Directives  Does Patient Have a Medical Advance Directive? Yes Yes Yes Yes Yes Yes Yes  Type of [place] will  [place] will  HFirst [place]Living will    Does patient want to make changes to medical advance directive?     No - Patient declined    Copy of [place] Chart? No - copy requested    No - copy requested       Current Medications (verified) Outpatient Encounter Medications as of 03/30/2022  Medication Sig   amLODipine (NORVASC) 2.5 MG tablet Take 1 tablet (2.5 mg total) by mouth daily.   aspirin EC 81 MG tablet Take 81 mg by mouth daily.   carbidopa-levodopa (SINEMET IR) 25-100 MG tablet Take 1 tablet three times a day with meals   Evolocumab with Infusor (REPATHA PUSHTRONEX SYSTEM) 420 MG/3.5ML SOCT Inject 3.5 mLs into the skin every 30 (thirty) days.   glipiZIDE (GLUCOTROL) 5 MG tablet Take 1 tablet (5 mg total) by mouth 2 (two) times daily before a meal.   Lancets (BD LANCET ULTRAFINE 30G) MISC USE ONE  TO CHECK GLUCOSE ONCE DAILY   MAGNESIUM CHLORIDE PO Take 250 mg by mouth daily.   memantine (NAMENDA) 10 MG tablet Take 1 tablet by mouth twice daily   nitroGLYCERIN (NITROSTAT) 0.4 MG SL tablet PLACE 1 TABLET UNDER THE TONGUE EVERY 5 MINUTES FOR 3 DOSES AS NEEDED FOR CHEST PAIN   ONETOUCH ULTRA test strip USE TO TEST DAILY   POTASSIUM GLUCONATE PO Take 99 mg by mouth daily.   prednisoLONE acetate (PRED FORTE) 1 % ophthalmic suspension Place 1 drop into the right eye daily.   vitamin B-12 (CYANOCOBALAMIN) 1000 MCG tablet Take 1,000 mcg by mouth daily.   No facility-administered encounter medications on file as of 03/30/2022.    Allergies (verified) Beta adrenergic blockers, Praluent [alirocumab], Sulfonamide derivatives, and Statins  History: Past Medical History:  Diagnosis Date   Alzheimer's dementia [facility])    sees Dr. Ellouise Newer   CAD S/P percutaneous coronary angioplasty 10/'03; 3/'09; 7/*09; 1/'17    a. Dr. Ellyn Hack; '03 - Cypher DES 3.0 mm 33 mm proximal-mid LAD (details D1 with ostial 60%); 3/'09 dRCA 2.75 mm x 13 mm Cypher DES (2.8 mm); 7/'09 pRCA 3.0 mm x 18 m Cypher DES (3.25 mm);; (2009) 2D Echo - EF 55%, (November 2014) nonischemic Myoview;; b. Promus DES to RCA and mid LAD 02/26/2015   Chronotropic incompetence 12/2011   ETT 11/2017: Normal blood pressure response.  No EKG  changes.  Exercise stopped due to fatigue and dyspnea.  Heart rate increased to low 90s from 50s.  Suggest chronotropic incompetence.  Severely impaired exercise capacity. -->  Results reviewed with electrophysiology: Still not indication for pacemaker.   Diabetes mellitus type 2 in obese [facility])    CAD   Diverticulosis    Dyslipidemia, goal LDL below 70    on [employer] study medicaion.   ED (erectile dysfunction)    GERD (gastroesophageal reflux disease)    Hemorrhoids 07/2002   Internal and External   Insomnia    Nodular basal cell carcinoma (BCC) 05/08/2019   left ear (cx39f)   Non-STEMI (non-ST elevated myocardial infarction) (HArvin 11/2001   a. Proximal LAD tandem lesions -- long DES stent covering both;; b. Jan 2017: mRCA PCI, mLAD PCI   Parkinson's disease    sees Dr. KEllouise Newer  Persistent sinus bradycardia    Personal history of colonic adenomas 09/06/2012   Psoriasis    Resting HR in 54s   SCC (squamous cell carcinoma) 11/13/2001   right upper outer arm (txpbx)   SCC (squamous cell carcinoma) 11/13/2009   right upper ear rim (cx3 59f   SCC (squamous cell carcinoma) 01/20/2010   right nose ([id number]  SCC (squamous cell carcinoma) 03/28/2014   right side of nose ( [id number] SCC (squamous cell carcinoma) 06/03/2016   right side of nose   SCC (squamous cell carcinoma) 05/08/2019   right ear inferior ([id number]   SCCA (squamous cell carcinoma) of skin 04/23/2021   Right Temporal Scalp (well diff) (tx p bx)   Spasm of esophagus    Squamous cell carcinoma in situ (SCCIS) 05/08/2019   Right Ear Inferior   Squamous cell carcinoma of skin 11/08/2001   top right rim ear    SVT (supraventricular tachycardia) 2003   Vertigo, benign positional    Past Surgical History:  Procedure Laterality Date   CARDIAC CATHETERIZATION N/A 02/26/2015   Procedure: Left Heart Cath and Coronary Angiography;  Surgeon: DavidLeonie Man mRCA 99% --> PCI. mLAD 75%-> FFR Guided PCI, Mod ISR  in pLAD & pRCA   CARDIAC CATHETERIZATION N/A 02/26/2015   Procedure: Coronary Stent Intervention;  Surgeon: DavidLeonie Man  [facility]: [facility] INV[facility] CV LAB: mRCA Promus Premier DES 3.0 x 12 (3.5), mLAD Promus Premier DES 2.75 x 20 (3.0)   CARDIAC CATHETERIZATION N/A 02/26/2015   Procedure: Intravascular Pressure Wire/FFR Study;  Surgeon: DavidLeonie Man  [facility];  Service: Cardiovascular: LAD 75% - FFR + -> PCI   CATARACT EXTRACTION  [id number]  12/16/2017   per Dr. GessnCarlean Purlnomatous polyps, no repeats due to age    CORONAmbler - 2003, RCA 3 & 7/ '09   Cypher 3.0 x 32 mid LAD; Cyper  2.75 x 13 - distal RCA, 3.0 x 18 Prox RCA   ESOPHAGOGASTRODUODENOSCOPY  08-11-02   esophageal dilation per Dr. Carlean Purl   HERNIA REPAIR     right inguinal    NM MYOVIEW LTD  Nov 2014   ~8 METS; EF 60%, no ischemia or infarction   PERCUTANEOUS CORONARY STENT INTERVENTION (PCI-S)  11/23/2001   NSTEMI: Prox-Mid LAD tandem ~80% lesions on either side of D1 (with 80% lesion) -- Cypher DES 3.0 mm x 33 m (covering both lesions)    PERCUTANEOUS CORONARY STENT INTERVENTION (PCI-S)  04/27/2007   Unstable Angina: Distal RCA 95%: Cypher 2.75x13  (2.8 mm); residual focal ~60%ISR in LAD stent, 60% D1 ostial (no PTCA on LAD due to no ischemia on ST)   PERCUTANEOUS CORONARY STENT INTERVENTION (PCI-S)  09/21/2007   Bradycardia & Unstable Angina: Prox RCA 70% - PCI Cypher DES 3.0 mm x 18 mm  (3.25 mm); ostial 60-70% jailed D1. LAD & distal RCA stents patent   SHOULDER SURGERY     left rotator cuff, Dr. Gladstone Lighter   TRANSTHORACIC ECHOCARDIOGRAM  March 2009   Normal LV size and function, EF 55%. Mild MR; mild RV dilation.   TRANSTHORACIC ECHOCARDIOGRAM  02/2015   EF 60-65%. Moderate concentric LVH. Normal function with normal regional wall motion. GR 1 DD   WRIST FRACTURE SURGERY     right   Family History  Problem Relation Age of Onset   Diabetes Mother     Hearing loss Father    Dementia Father    Heart attack Father 27       first MI prior to age 35   Heart attack Brother    Social History   Socioeconomic History   Marital status: Married    Spouse name: Not on file   Number of children: 4   Years of education: Not on file   Highest education level: Not on file  Occupational History   Occupation: retired  Tobacco Use   Smoking status: Every Day    Types: Cigars    Start date: 07/06/1952   Smokeless tobacco: Never   Tobacco comments:    1-2 per day; he says "I really don't inhale"  Vaping Use   Vaping Use: Never used  Substance and Sexual Activity   Alcohol use: Yes    Alcohol/week: 0.0 standard drinks of alcohol    Comment: occ   Drug use: No   Sexual activity: Never  Other Topics Concern   Not on file  Social History Narrative   He is a married, father of 55, grandfather 3.   He still smokes 2-3 cigars per day. States that he "does not really inhale ". He is not really all that it's including, stating that he wants to have his 1 remaining vice.   Exercises only on occasion.   He does various landscaping jobs including cutting wood, and clearing brush.      Pt is right handed   Lives in 2 story home with his wife   High school graduate   Retired Dealer   Social Determinants of Health   Financial Resource Strain: [place]  (03/30/2022)   Overall Financial Resource Strain (CARDIA)    Difficulty of Paying Living Expenses: Not hard at all  Recent Concern: Financial Resource Strain - Medium Risk (01/06/2022)   Overall Financial Resource Strain (CARDIA)    Difficulty of Paying Living Expenses: Somewhat hard  Food Insecurity: No Food Insecurity (03/30/2022)   Hunger Vital Sign  Worried About Charity fundraiser in the Last Year: Never true    [place] in the Last Year: Never true  Transportation Needs: No Transportation Needs (03/30/2022)   PRAPARE - Hydrologist (Medical): No    Lack  of Transportation (Non-Medical): No  Physical Activity: Inactive (03/30/2022)   Exercise Vital Sign    Days of Exercise per Week: 0 days    Minutes of Exercise per Session: 0 min  Stress: No Stress Concern Present (03/30/2022)   [place]    Feeling of Stress : Not at all  Social Connections: [place] (03/30/2022)   Social Connection and Isolation Panel [NHANES]    Frequency of Communication with Friends and Family: More than three times a week    Frequency of Social Gatherings with Friends and Family: More than three times a week    Attends Religious Services: More than 4 times per year    Active Member of [employer] or Organizations: Yes    Attends Music therapist: More than 4 times per year    Marital Status: Married    Tobacco Counseling Ready to quit: No Counseling given: Yes Tobacco comments: 1-2 per day; he says "I really don't inhale"   Clinical Intake:  Pre-visit preparation completed: No  Pain : No/denies pain Pain Score: 0-No pain     BMI - recorded: 32.99 Nutritional Risks: None Diabetes: Yes CBG done?: No Did pt. bring in CBG monitor from home?: No Nutrition Risk Assessment:  Has the patient had any N/V/D within the last 2 months?  No  Does the patient have any non-healing wounds?  No  Has the patient had any unintentional weight loss or weight gain?  No   Diabetes:  Is the patient diabetic?  Yes  If diabetic, was a CBG obtained today?  No  Did the patient bring in their glucometer from home?  No  How often do you monitor your CBG's? PRN.   Financial Strains and Diabetes Management:  Are you having any financial strains with the device, your supplies or your medication? No .  Does the patient want to be seen by Chronic Care Management for management of their diabetes?  No  Would the patient like to be referred to a Nutritionist or for Diabetic Management?  No    Diabetic Exams:  Diabetic Eye Exam: Completed No. Overdue for diabetic eye exam. Pt has been advised about the importance in completing this exam. A referral has been placed today. Message sent to referral coordinator for scheduling purposes. Advised pt to expect a call from office referred to regarding appt.  Diabetic Foot Exam: Completed No. Pt has been advised about the importance in completing this exam. Pt is scheduled for diabetic foot exam on Followed by PCP.   How often do you need to have someone help you when you read instructions, pamphlets, or other written materials from your doctor or pharmacy?: 5 - Always (Wife assist)  Diabetic?  Yes  Interpreter Needed?: No  Information entered by :: Rolene Arbour LPN   Activities of Daily Living    03/30/2022   11:17 AM  In your present state of health, do you have any difficulty performing the following activities:  Hearing? 0  Vision? 0  Difficulty concentrating or making decisions? 0  Walking or climbing stairs? 0  Dressing or bathing? 0  Doing errands, shopping? 0  Preparing Food and  eating ? N  Using the Toilet? N  In the past six months, have you accidently leaked urine? N  Do you have problems with loss of bowel control? N  Managing your Medications? N  Managing your Finances? N  Housekeeping or managing your Housekeeping? N    Patient Care Team: Laurey Morale, MD as PCP - General Ellyn Hack Leonie Green, MD as PCP - Cardiology (Cardiology) Cameron Sprang, MD as Consulting Physician (Neurology) Lavonna Monarch, MD (Inactive) as Consulting Physician (Dermatology) Viona Gilmore, [facility] (Inactive) as Pharmacist (Pharmacist)  Indicate any recent Medical Services you may have received from other than [facility] providers in the past year (date may be approximate).     Assessment:   This is a routine wellness examination for [place].  Hearing/Vision screen Hearing Screening - Comments:: Denies hearing difficulties   Vision  Screening - Comments:: Wears rx glasses - up to date with routine eye exams with  [facility]  Dietary issues and exercise activities discussed: Current Exercise Habits: The patient does not participate in regular exercise at present, Exercise limited by: Other - see comments (Dx Dementia)   Goals Addressed               This Visit's Progress     No current goals (pt-stated)         Depression Screen    03/30/2022   11:15 AM 11/11/2021    9:47 AM 03/27/2021   10:57 AM 03/26/2020   10:41 AM 08/27/2017    8:28 AM 08/16/2015   10:08 AM 07/04/2014    9:11 AM  PHQ 2/9 Scores  PHQ - 2 Score 0 0 0 0 0 0 0  PHQ- 9 Score  5         Fall Risk    03/30/2022   11:20 AM 02/27/2022    8:51 AM 11/11/2021    9:46 AM 08/27/2021    9:56 AM 03/27/2021   11:10 AM  Fall Risk   Falls in the past year? 1 1 0 1 1  Number falls in past yr: 0 1 0 0 0  Injury with Fall? 0 0 0 1 0  Comment No Medical attention needed    Fall w/o injury or medical attention needed  Risk for fall due to : Impaired balance/gait  History of fall(s)  Impaired balance/gait  Follow up Falls prevention discussed Falls evaluation completed Falls evaluation completed  Falls evaluation completed    FALL RISK PREVENTION PERTAINING TO THE HOME:  Any stairs in or around the home? Yes  If so, are there any without handrails? No  Home free of loose throw rugs in walkways, pet beds, electrical cords, etc? Yes  Adequate lighting in your home to reduce risk of falls? Yes   ASSISTIVE DEVICES UTILIZED TO PREVENT FALLS:  Life alert? No  Use of a cane, walker or w/c? Yes  Grab bars in the bathroom? Yes Shower chair or bench in shower? Yes  Elevated toilet seat or a handicapped toilet? No   TIMED UP AND GO:  Was the test performed? No . Audio Visit  Cognitive Function:    08/27/2021   10:00 AM 02/21/2020   11:00 AM 12/28/2019    9:00 AM  MMSE - Mini Mental State Exam  Orientation to time '4 3 4  '$ Orientation to Place '5 5 5   '$ Registration '3 3 3  '$ Attention/ Calculation '2 3 5  '$ Recall 2 1 0  Language- name 2  objects '2 2 2  '$ Language- repeat '1 1 1  '$ Language- follow 3 step command '3 3 3  '$ Language- read & follow direction '1 1 1  '$ Write a sentence 0 1 1  Copy design 0 1 1  Total score '23 24 26      '$ 11/11/2018   11:00 AM 04/06/2018    9:00 AM  Montreal Cognitive Assessment   Visuospatial/ Executive (0/5)  4  Naming (0/3)  3  Attention: Read list of digits (0/2) 2 2  Attention: Read list of letters (0/1) 1 1  Attention: Serial 7 subtraction starting at 100 (0/3) 3 3  Language: Repeat phrase (0/2) 1 1  Language : Fluency (0/1) 0 0  Abstraction (0/2) 1 2  Delayed Recall (0/5) 0 2  Orientation (0/6) 5 6  Total  24  Adjusted Score (based on education)  25      03/30/2022   11:21 AM 03/27/2021   11:14 AM 03/26/2020   10:43 AM  6CIT Screen  What Year? 4 points 0 points 0 points  What month? 3 points 0 points 0 points  What time? 3 points 0 points 0 points  Count back from 20 4 points 0 points 0 points  Months in reverse 4 points 2 points 0 points  Repeat phrase 8 points 2 points 4 points  Total Score 26 points 4 points 4 points    Immunizations Immunization History  Administered Date(s) Administered   Fluad Quad(high Dose 65+) 11/04/2018, 11/07/2019, 11/11/2021   Influenza Split 11/27/2010   Influenza, High Dose Seasonal PF 12/18/2013, 01/02/2015, 12/26/2016, 12/19/2017, 12/18/2020   Influenza-Unspecified 12/24/2012, 12/18/2013, 12/25/2014   PFIZER Comirnaty(Gray Top)Covid-19 Tri-Sucrose Vaccine 02/03/2022   PFIZER(Purple Top)SARS-COV-2 Vaccination 03/16/2019, 04/06/2019, 10/14/2019, 09/08/2020   Pfizer Covid-19 Vaccine Bivalent Booster 23yr & up 12/25/2020   Pneumococcal Conjugate-13 08/16/2015   Pneumococcal Polysaccharide-23 05/01/2009   Zoster Recombinat (Shingrix) 12/19/2017, 02/18/2018    TDAP status: Due, Education has been provided regarding the importance of this vaccine. Advised may receive  this vaccine at local pharmacy or Health Dept. Aware to provide a copy of the vaccination record if obtained from local pharmacy or Health Dept. Verbalized acceptance and understanding.  Flu Vaccine status: Up to date  Pneumococcal vaccine status: Up to date  Covid-19 vaccine status: Completed vaccines  Qualifies for Shingles Vaccine? Yes   Zostavax completed Yes   Shingrix Completed?: Yes  Screening Tests Health Maintenance  Topic Date Due   FOOT EXAM  Never done   DTaP/Tdap/Td (1 - Tdap) Never done   Diabetic kidney evaluation - Urine ACR  08/08/2016   COVID-19 Vaccine (7 - 2023-24 season) 03/31/2022   HEMOGLOBIN A1C  05/12/2022   OPHTHALMOLOGY EXAM  09/20/2022   Diabetic kidney evaluation - eGFR measurement  11/12/2022   Medicare Annual Wellness (AWV)  03/31/2023   Pneumonia Vaccine 82 Years old  Completed   INFLUENZA VACCINE  Completed   Zoster Vaccines- Shingrix  Completed   HPV VACCINES  Aged Out    Health Maintenance  Health Maintenance Due  Topic Date Due   FOOT EXAM  Never done   DTaP/Tdap/Td (1 - Tdap) Never done   Diabetic kidney evaluation - Urine ACR  08/08/2016   COVID-19 Vaccine (7 - 2023-24 season) 03/31/2022    Colorectal cancer screening: No longer required.   Lung Cancer Screening: (Low Dose CT Chest recommended if Age 83-80years, 30 pack-year currently smoking OR have quit w/in 15years.) does qualify.   Lung Cancer Screening Referral:  Deferred  Additional Screening:  Hepatitis C Screening: does not qualify; Completed    Vision Screening: Recommended annual ophthalmology exams for early detection of glaucoma and other disorders of the eye. Is the patient up to date with their annual eye exam?  Yes  Who is the provider or what is the name of the office in which the patient attends annual eye exams? [place] If pt is not established with a provider, would they like to be referred to a provider to establish care? No .   Dental  Screening: Recommended annual dental exams for proper oral hygiene  Community Resource Referral / Chronic Care Management: CRR required this visit?  No   CCM required this visit?  No      Plan:     I have personally reviewed and noted the following in the patient's chart:   Medical and social history Use of alcohol, tobacco or illicit drugs  Current medications and supplements including opioid prescriptions. Patient is not currently taking opioid prescriptions. Functional ability and status Nutritional status Physical activity Advanced directives List of other physicians Hospitalizations, surgeries, and ER visits in previous 12 months Vitals Screenings to include cognitive, depression, and falls Referrals and appointments  In addition, I have reviewed and discussed with patient certain preventive protocols, quality metrics, and best practice recommendations. A written personalized care plan for preventive services as well as general preventive health recommendations were provided to patient.     Criselda Peaches, LPN   1/0/9323   Nurse Notes: Patient due Diabetic kidney evaluation-Urine ACR. Patient 6 CIT Score 26. Noted unable to perform, Patient Dx Dementia

## 2022-04-06 ENCOUNTER — Ambulatory Visit: Payer: Medicare HMO | Attending: Cardiology | Admitting: Cardiology

## 2022-04-06 ENCOUNTER — Encounter: Payer: Self-pay | Admitting: Cardiology

## 2022-04-06 ENCOUNTER — Other Ambulatory Visit: Payer: Self-pay | Admitting: *Deleted

## 2022-04-06 VITALS — BP 134/82 | HR 48 | Ht 68.0 in | Wt 217.2 lb

## 2022-04-06 DIAGNOSIS — I4589 Other specified conduction disorders: Secondary | ICD-10-CM

## 2022-04-06 DIAGNOSIS — F02B0 Dementia in other diseases classified elsewhere, moderate, without behavioral disturbance, psychotic disturbance, mood disturbance, and anxiety: Secondary | ICD-10-CM

## 2022-04-06 DIAGNOSIS — R69 Illness, unspecified: Secondary | ICD-10-CM | POA: Diagnosis not present

## 2022-04-06 DIAGNOSIS — I2510 Atherosclerotic heart disease of native coronary artery without angina pectoris: Secondary | ICD-10-CM

## 2022-04-06 DIAGNOSIS — I10 Essential (primary) hypertension: Secondary | ICD-10-CM | POA: Diagnosis not present

## 2022-04-06 DIAGNOSIS — E785 Hyperlipidemia, unspecified: Secondary | ICD-10-CM | POA: Diagnosis not present

## 2022-04-06 DIAGNOSIS — G301 Alzheimer's disease with late onset: Secondary | ICD-10-CM

## 2022-04-06 DIAGNOSIS — I214 Non-ST elevation (NSTEMI) myocardial infarction: Secondary | ICD-10-CM

## 2022-04-06 DIAGNOSIS — R42 Dizziness and giddiness: Secondary | ICD-10-CM | POA: Diagnosis not present

## 2022-04-06 DIAGNOSIS — G20A2 Parkinson's disease without dyskinesia, with fluctuations: Secondary | ICD-10-CM | POA: Diagnosis not present

## 2022-04-06 DIAGNOSIS — Z9861 Coronary angioplasty status: Secondary | ICD-10-CM | POA: Diagnosis not present

## 2022-04-06 MED ORDER — REPATHA PUSHTRONEX SYSTEM 420 MG/3.5ML ~~LOC~~ SOCT: 3.5000 mL | SUBCUTANEOUS | 11 refills | Status: AC

## 2022-04-06 NOTE — Progress Notes (Signed)
Primary Care Provider: Laurey Morale, [place] Cardiologist: Glenetta Hew, MD Electrophysiologist: None  Clinic Note: Chief Complaint  Patient presents with   Follow-up    Delayed annual follow-up.  Stable.   Coronary Artery Disease    No angina   ===================================  ASSESSMENT/PLAN   Problem List Items Addressed This Visit       Cardiology Problems   NSTEMI (non-ST elevated myocardial infarction) ([place]) (Chronic)    7 years out from most recent ACS presentation with two-vessel PCI.  No significant heart failure or angina symptoms since.  Not very active-limited by his Parkinson's.  Labile blood pressures have made titration of medications difficult as has bradycardia.      Relevant Orders   EKG 12-Lead (Completed)   Essential hypertension -> allowing permissive hypertension.  Medication stopped because of fatigue and orthostatic dizziness. (Chronic)    Blood pressures have been labile in the past.  Currently pressure seem to be little bit better and PCP started low-dose amlodipine.  Allowing for permissive hypertension.. Clearly cannot use beta-blocker because of bradycardia.      Dyslipidemia, goal LDL below 70 (Chronic)    Labs from September look great on Repatha.  Memory issues certainly exacerbated by statins.  Both memory issues and myalgias improved being off statins.      Coronary artery disease involving native coronary artery of native heart without angina pectoris (Chronic)    No active angina Continue aspirin and Repatha      Relevant Orders   EKG 12-Lead (Completed)   CAD S/P percutaneous coronary angioplasty - LADx2, & RCA x 3 - Primary (Chronic)    CAD dating back to the early 2000's with most recent PCI in January 2017-LAD and RCA. Thankfully, no active angina.  He is however not very active.  Limited by Parkinson's related balance issues. No beta-blocker because of profound bradycardia. He is now on aspirin  monotherapy-notably less bruising. PCP recently added amlodipine 2.5 mg daily. He is remains on Repatha for lipid control.      Relevant Orders   EKG 12-Lead (Completed)     Other   Vertigo with mild postural lightheadedness. (Chronic)    Again, plan will be to allow for permissive hypertension.  He continues to work with balance physical therapy.      Parkinson's disease   Chronotropic incompetence (Chronic)    Baseline bradycardia with evidence of chronotropic incompetence, but still had not been at the extent to make him a candidate for PPM.  Continue to monitor.  Low threshold to reassess with event monitor.      Relevant Orders   EKG 12-Lead (Completed)   Alzheimer's dementia ([place])   ===================================  HPI:    Joe Murray is a 82 y.o. male with a PMH notable for CAD/non-STEMI-PCI (last PCI 2017), HTN, HLD, DM 2 and Longstanding Parkinson's Disease with Progressive Dementia who presents today for Delayed annual follow-up. He returns today at the request of Laurey Morale, MD.  [name] was last seen on 01/06/2021 for annual follow-up.  He had suffered a fall, tripping over his lawnmower 2 to 3 months prior to this visit.  Has been very inactive since then.  He sort of lost his appetite for exercise because of poor balance and weakness.  Lost the motivation.  No longer walking fluidly.  No longer doing that [employer].  With minimal activity, no active cardiac symptoms of chest pain pressure or dyspnea at rest or  exertion.  No CHF symptoms of PND, orthopnea with trivial end of day swelling that goes down with foot elevation.  Mild palpitations.  Recent Hospitalizations: None  Reviewed  CV studies:    The following studies were reviewed today: (if available, images/films reviewed: From Epic Chart or Care Everywhere) None:  Interval History:   MANLEY TUNKS returns for annual follow-up pretty stable overall from a cardiac standpoint.  He has  been working on PT for balance and is not really sure if it is helping him all that much.  He is still not very active.  Progressive memory loss and deconditioning with inability.  Because of his lack of exercise, he does have exertional dyspnea and fatigue/exercise intolerance.  Will get short of breath on occasion oftentimes after eating.  He says he has mild bilateral lower extremity swelling at the end of the day if he goes down when he puts his feet up.  No PND orthopnea to go with it.  Despite having a heart rate of 48 bpm, he does not necessarily notice feeling dizziness and lightheadedness or fatigue here at rest.  He has not had any syncope or near syncope.  No TIA or amaurosis fugax.  Just exercise intolerance and fatigue.  Poor balance.  Has not had a fall recently. No bleeding issues.  REVIEWED OF SYSTEMS   Review of Systems  Constitutional:  Positive for malaise/fatigue (Exercise intolerance). Negative for weight loss (Is actually 6 pounds up from last visit.).  HENT:  Negative for congestion and nosebleeds.   Respiratory:  Positive for cough (Occasional, nonproductive) and shortness of breath (With overexertion).   Cardiovascular:        Per HPI-trivial edema.  Occasional palpitations.  Lightheadedness and dizziness but no syncope.  Gastrointestinal:  Negative for blood in stool and melena.  Genitourinary:  Negative for hematuria.  Musculoskeletal:  Positive for back pain. Negative for falls (Has had near falls, but not full falls.) and joint pain.  Neurological:  Positive for dizziness and speech change (Slower). Negative for focal weakness.  Endo/Heme/Allergies:  Does not bruise/bleed easily (Mild senile purpura but notably better being off of Plavix.Marland Kitchen).  Psychiatric/Behavioral:  Positive for memory loss. Negative for depression. The patient is not nervous/anxious.    I have reviewed and (if needed) personally updated the patient's problem list, medications, allergies, past  medical and surgical history, social and family history.   PAST MEDICAL HISTORY   Past Medical History:  Diagnosis Date   Alzheimer's dementia [facility])    sees Dr. Ellouise Newer   CAD S/P percutaneous coronary angioplasty 10/'03; 3/'09; 7/*09; 1/'17    a. Dr. Ellyn Hack; '03 - Cypher DES 3.0 mm 33 mm proximal-mid LAD (details D1 with ostial 60%); 3/'09 dRCA 2.75 mm x 13 mm Cypher DES (2.8 mm); 7/'09 pRCA 3.0 mm x 18 m Cypher DES (3.25 mm);; (2009) 2D Echo - EF 55%, (November 2014) nonischemic Myoview;; b. Promus DES to RCA and mid LAD 02/26/2015   Chronotropic incompetence 12/2011   ETT 11/2017: Normal blood pressure response.  No EKG changes.  Exercise stopped due to fatigue and dyspnea.  Heart rate increased to low 90s from 50s.  Suggest chronotropic incompetence.  Severely impaired exercise capacity. -->  Results reviewed with electrophysiology: Still not indication for pacemaker.   Diabetes mellitus type 2 in obese [facility])    CAD   Diverticulosis    Dyslipidemia, goal LDL below 70    on [employer] study medicaion.   ED (erectile dysfunction)  GERD (gastroesophageal reflux disease)    Hemorrhoids 07/2002   Internal and External   Insomnia    Nodular basal cell carcinoma (BCC) 05/08/2019   left ear ([id number])   Non-STEMI (non-ST elevated [place] infarction[place] 11[place]   a. Proximal LAD tandem lesions -- long DES stent covering both;; b. Jan 2017: mRCA PCI, mLAD PCI   Parkinson's disease    sees Dr. KEllouise Newer  Persistent sinus bradycardia    Personal history of colonic adenomas 09/06/2012   Psoriasis    Resting HR in 54s   SCC (squamous cell carcinoma) 11/13/2001   right upper outer arm (txpbx)   SCC (squamous cell carcinoma) 11/13/2009   right upper ear rim ([id number]   SCC (squamous cell carcinoma) 01/20/2010   right nose ([id number]  SCC (squamous cell carcinoma) 03/28/2014   right side of nose ( [id number] SCC (squamous cell carcinoma) 06/03/2016   right side of nose   SCC  (squamous cell carcinoma) 05/08/2019   right ear inferior ([id number]   SCCA (squamous cell carcinoma) of skin 04/23/2021   Right Temporal Scalp (well diff) (tx p bx)   Spasm of esophagus    Squamous cell carcinoma in situ (SCCIS) 05/08/2019   Right Ear Inferior   Squamous cell carcinoma of skin 11/08/2001   top right rim ear    SVT (supraventricular tachycardia) 2003   Vertigo, benign positional     PAST SURGICAL HISTORY   Past Surgical History:  Procedure Laterality Date   CARDIAC CATHETERIZATION N/A 02/26/2015   Procedure: Left Heart Cath and Coronary Angiography;  Surgeon: DavidLeonie Man mRCA 99% --> PCI. mLAD 75%-> FFR Guided PCI, Mod ISR in pLAD & pRCA   CARDIAC CATHETERIZATION N/A 02/26/2015   Procedure: Coronary Stent Intervention;  Surgeon: DavidLeonie Man  [facility]: [facility] INVASIVE CV LAB: mRCA Promus Premier DES 3.0 x 12 (3.5), mLAD Promus Premier DES 2.75 x 20 (3.0)   CARDIAC CATHETERIZATION N/A 02/26/2015   Procedure: Intravascular Pressure Wire/FFR Study;  Surgeon: DavidLeonie Man  [facility];  Service: Cardiovascular: LAD 75% - FFR + -> PCI   CATARACT EXTRACTION  2004,LE:[id number]  12/16/2017   per Dr. GessnCarlean Purlnomatous polyps, no repeats due to age    [place] - 2003, RCA 3 & 7/ '09   Cypher 3.0 x 32 mid LAD; Cyper 2.75 x 13 - distal RCA, 3.0 x 18 Prox RCA   ESOPHAGOGASTRODUODENOSCOPY  08-11-02   esophageal dilation per Dr. GessnCarlean PurlRNIA REPAIR     right inguinal    NM MYOVIEW LTD  Nov 2014   ~8 METS; EF 60%, no ischemia or infarction   PERCUTANEOUS CORONARY STENT INTERVENTION (PCI-S)  11/23/2001   NSTEMI: Prox-Mid LAD tandem ~80% lesions on either side of D1 (with 80% lesion) -- Cypher DES 3.0 mm x 33 m (covering both lesions)    PERCUTANEOUS CORONARY STENT INTERVENTION (PCI-S)  04/27/2007   Unstable Angina: Distal RCA 95%: Cypher 2.75x13  (2.8 mm); residual focal ~60%ISR in LAD stent, 60% D1  ostial (no PTCA on LAD due to no ischemia on ST)   PERCUTANEOUS CORONARY STENT INTERVENTION (PCI-S)  09/21/2007   Bradycardia & Unstable Angina: Prox RCA 70% - PCI Cypher DES 3.0 mm x 18 mm  (3.25 mm); ostial 60-70% jailed D1. LAD & distal RCA stents patent   SHOULDER SURGERY     left rotator  cuff, Dr. Gladstone Lighter   TRANSTHORACIC ECHOCARDIOGRAM  March 2009   Normal LV size and function, EF 55%. Mild MR; mild RV dilation.   TRANSTHORACIC ECHOCARDIOGRAM  02/2015   EF 60-65%. Moderate concentric LVH. Normal function with normal regional wall motion. GR 1 DD   WRIST FRACTURE SURGERY     right     MEDICATIONS/ALLERGIES   Current Meds  Medication Sig   amLODipine (NORVASC) 2.5 MG tablet Take 1 tablet (2.5 mg total) by mouth daily.   aspirin EC 81 MG tablet Take 81 mg by mouth daily.   carbidopa-levodopa (SINEMET IR) 25-100 MG tablet Take 1 tablet three times a day with meals   Evolocumab with Infusor (REPATHA PUSHTRONEX SYSTEM) 420 MG/3.5ML SOCT Inject 3.5 mLs into the skin every 30 (thirty) days.   glipiZIDE (GLUCOTROL) 5 MG tablet Take 1 tablet (5 mg total) by mouth 2 (two) times daily before a meal.   Lancets (BD LANCET ULTRAFINE 30G) MISC USE ONE  TO CHECK GLUCOSE ONCE DAILY   MAGNESIUM CHLORIDE PO Take 250 mg by mouth daily.   memantine (NAMENDA) 10 MG tablet Take 1 tablet by mouth twice daily   nitroGLYCERIN (NITROSTAT) 0.4 MG SL tablet PLACE 1 TABLET UNDER THE TONGUE EVERY 5 MINUTES FOR 3 DOSES AS NEEDED FOR CHEST PAIN   ONETOUCH ULTRA test strip USE TO TEST DAILY   POTASSIUM GLUCONATE PO Take 99 mg by mouth daily.   prednisoLONE acetate (PRED FORTE) 1 % ophthalmic suspension Place 1 drop into the right eye daily.   vitamin B-12 (CYANOCOBALAMIN) 1000 MCG tablet Take 1,000 mcg by mouth daily.    Allergies  Allergen Reactions   Beta Adrenergic Blockers Other (See Comments)    chronotropic incompetence   Praluent [Alirocumab]     Diffuse Rash - improved when stopping & taking Benadryl    Sulfonamide Derivatives     unknown   Statins Other (See Comments)    REACTION: myalgias    SOCIAL HISTORY/FAMILY HISTORY   Reviewed in Epic:  Pertinent findings:  Social History   Tobacco Use   Smoking status: Every Day    Types: Cigars    Start date: 07/06/1953   Smokeless tobacco: Never   Tobacco comments:    1-2 per day; he says "I really don't inhale"  Vaping Use   Vaping Use: Never used  Substance Use Topics   Alcohol use: Yes    Alcohol/week: 0.0 standard drinks of alcohol    Comment: occ   Drug use: No   => Patient enjoys prophy, cigar throughout the course of the day, may be will complete 1 cigar in a day.  Has no interest in stopping.  He does not "truly inhale "  Social History   Social History Narrative   He is a married, father of 13, grandfather 3.   He still smokes 2-3 cigars per day. States that he "does not really inhale ". He is not really all that it's including, stating that he wants to have his 1 remaining vice.   Exercises only on occasion.   He does various landscaping jobs including cutting wood, and clearing brush.      Pt is right handed   Lives in 2 story home with his wife   High school graduate   Retired Dealer    OBJCTIVE -[place], EKG, labs   Wt Readings from Last 3 Encounters:  04/06/22 217 lb 3.2 oz (98.5 kg)  03/30/22 217 lb (98.4 kg)  02/27/22 217 lb (  98.4 kg)  01/06/2021 -> 211 lb 9.6 oz (96 kg)  Physical Exam: BP 134/82   Pulse (!) 48   Ht 5' 8"$  (1.727 m)   Wt 217 lb 3.2 oz (98.5 kg)   SpO2 98%   BMI 33.03 kg/m  Physical Exam Vitals reviewed.  Constitutional:      General: He is not in acute distress.    Appearance: Normal appearance. He is obese. He is not ill-appearing (Well-groomed.) or toxic-appearing.  HENT:     Head: Normocephalic and atraumatic.     Ears:     Comments: Very hard of hearing Neck:     Vascular: No carotid bruit or JVD.     Comments: Neck is stiff, decreased mobility but not  rigid. Cardiovascular:     Rate and Rhythm: Regular rhythm. Bradycardia present. No extrasystoles are present.    Chest Wall: PMI is not displaced.     Pulses: Decreased pulses (Diminished but palpable).     Heart sounds: S1 normal and S2 normal. Heart sounds are distant. No murmur heard.    No friction rub. No gallop.  Pulmonary:     Effort: Pulmonary effort is normal. No respiratory distress.     Breath sounds: Normal breath sounds. No wheezing, rhonchi or rales.  Chest:     Chest wall: No tenderness.  Musculoskeletal:        General: Swelling (Trivial ankle swelling bilateral) present.     Cervical back: Normal range of motion.  Skin:    General: Skin is warm and dry.     Coloration: Skin is not jaundiced or pale.  Neurological:     General: No focal deficit present.     Mental Status: He is alert and oriented to person, place, and time. Mental status is at baseline.     Gait: Gait abnormal (Shuffling gait).  Psychiatric:        Mood and Affect: Mood normal.        Behavior: Behavior normal.     Comments: Slow response.  Poor historian.  Slow/steady speech.  Difficult to tell if it is slow responses related to memory loss or slowed mentation.  Stable from baseline.     Adult ECG Report  Rate: 48 ;  Rhythm: sinus bradycardia, sinus arrhythmia, and LAFB (-86 ); ?  Pulmonary disease pattern ;   Narrative Interpretation: Stable  Recent Labs: Reviewed Lab Results  Component Value Date   CHOL 122 11/11/2021   HDL 52.50 11/11/2021   LDLCALC 43 11/11/2021   TRIG 137.0 11/11/2021   CHOLHDL 2 11/11/2021   Lab Results  Component Value Date   CREATININE 1.31 11/11/2021   BUN 26 (H) 11/11/2021   NA 137 11/11/2021   K 4.7 11/11/2021   CL 104 11/11/2021   CO2 23 11/11/2021      Latest Ref Rng & Units 11/11/2021    8:33 AM 11/07/2020   10:15 AM 11/07/2019   10:48 AM  CBC  WBC 4.0 - 10.5 K/uL 7.0  7.2  7.2   Hemoglobin 13.0 - 17.0 g/dL 14.5  15.1  14.8   Hematocrit 39.0 -  52.0 % 42.6  45.5  44.4   Platelets 150.0 - 400.0 K/uL 186.0  199.0  204     Lab Results  Component Value Date   HGBA1C 7.0 (H) 11/11/2021   Lab Results  Component Value Date   TSH 2.27 11/11/2021    ================================================== I spent a total of 19 minutes with the patient  spent in direct patient consultation.  Additional time spent with chart review  / charting (studies, outside notes, etc): 14 min Total Time: 33 min  Current medicines are reviewed at length with the patient today.  (+/- concerns) none  Notice: This dictation was prepared with Dragon dictation along with smart phrase technology. Any transcriptional errors that result from this process are unintentional and may not be corrected upon review.  Studies Ordered:   Orders Placed This Encounter  Procedures   EKG 12-Lead   No orders of the defined types were placed in this encounter.   Patient Instructions / Medication Changes & Studies & Tests Ordered   Patient Instructions  Medication Instructions:  Not needed  *If you need a refill on your cardiac medications before your next appointment, please call your pharmacy*   Lab Work: Not needed    Testing/Procedures:  Not needed  Follow-Up: At [facility], you and your health needs are our priority.  As part of our continuing mission to provide you with exceptional heart care, we have created designated Provider Care Teams.  These Care Teams include your primary Cardiologist (physician) and Advanced Practice Providers (APPs -  Physician Assistants and Nurse Practitioners) who all work together to provide you with the care you need, when you need it.     Your next appointment:   12 month(s)  The format for your next appointment:   In Person or virtual   Provider:   Glenetta Hew, MD        Leonie Man, MD, MS Glenetta Hew, M.D., M.S. Interventional Cardiologist  [place]  Pager #  [phone] Phone # [phone] [address] [place], [place] [address]   Thank you for choosing [facility] [place]!!

## 2022-04-06 NOTE — Patient Instructions (Addendum)
Medication Instructions:  Not needed  *If you need a refill on your cardiac medications before your next appointment, please call your pharmacy*   Lab Work: Not needed    Testing/Procedures:  Not needed  Follow-Up: At [facility], you and your health needs are our priority.  As part of our continuing mission to provide you with exceptional heart care, we have created designated Provider Care Teams.  These Care Teams include your primary Cardiologist (physician) and Advanced Practice Providers (APPs -  Physician Assistants and Nurse Practitioners) who all work together to provide you with the care you need, when you need it.     Your next appointment:   12 month(s)  The format for your next appointment:   In Person or virtual   Provider:   Glenetta Hew, MD

## 2022-04-07 ENCOUNTER — Other Ambulatory Visit: Payer: Self-pay | Admitting: Cardiology

## 2022-04-07 ENCOUNTER — Telehealth: Payer: Self-pay

## 2022-04-07 DIAGNOSIS — I214 Non-ST elevation (NSTEMI) myocardial infarction: Secondary | ICD-10-CM

## 2022-04-07 DIAGNOSIS — I2510 Atherosclerotic heart disease of native coronary artery without angina pectoris: Secondary | ICD-10-CM

## 2022-04-07 NOTE — Progress Notes (Signed)
Care Management & Coordination Services Pharmacy Team  Reason for Encounter: Hypertension  Contacted patient to discuss hypertension disease state. Spoke with family on 04/07/2022 Spoke with patients wife.   Current antihypertensive regimen:  Amlodipine 2.5 mg daily Sinemet IR 25/100 1 tablet three times daily  Patient verbally confirms he is taking the above medications as directed. Yes  How often are you checking your Blood Pressure? Patients wife states she generally tries to check his blood pressures twice weekly.   Patients wife checks his blood pressure in the morning about 1 hour after taking his medication.  Current home BP readings:  DATE:             BP               PULSE 03/04/2022 132/64  - 03/12/2022 141/59  - 03/22/2022 138/57  - 03/29/2022 100/51  -   Wrist or arm cuff: Arm cuff  OTC medications including pseudoephedrine or NSAIDs? On a very rare occasion he will take Motrin or Nyquil.   Any readings above 180/100? Patients wife denies  What recent interventions/DTPs have been made by any provider to improve Blood Pressure control since last CPP Visit: No recent interventions  Any recent hospitalizations or ED visits since last visit with CPP? No recent hospital visits  What diet changes have been made to improve Blood Pressure Control?  Diet changes - patient eats what he wants. Breakfast - patient will have sausage and egg biscuit with melon Lunch - patient doesn't eat lunch Dinner - patient will have a meat with two vegetables Caffeine intake - patients wife denies any caffeine drinks Salt intake - patient doesn't add salt  What exercise is being done to improve your Blood Pressure Control?  Patient walks around the house some, very little exercise.  Adherence Review: Is the patient currently on ACE/ARB medication? No Does the patient have >5 day gap between last estimated fill dates? No  Star Rating Drugs:  Glipizide 5 mg - last filled 02/10/2022  90 DS at [place]: AWV - completed 03/30/2022,  scheduled 04/09/2023 Last eye exam - 09/19/2021 Last foot exam - never done Tdap - never done Urine ACR - overdue Covid - overdue  Chart Updates:  Recent office visits:  03/30/2022 Rolene Arbour LPN - Medicare annual wellness exam   02/06/2022 Jeanie Sewer NP - Patient was seen for urinary frequency. No medication changes.   Recent consult visits:  04/06/2022 Glenetta Hew MD (cardiology) - Patient was seen for Essential hypertension -> allowing permissive hypertension.  Medication stopped because of fatigue and orthostatic dizziness and additional concerns. No medication changes.   02/27/2022 Sharene Butters PA-C (neurology) - Patient was seen for Alzheimer's dementia, late onset, with behavioral disturbance. No medication changes.   Hospital visits:  None  Medications: Outpatient Encounter Medications as of 04/07/2022  Medication Sig   amLODipine (NORVASC) 2.5 MG tablet Take 1 tablet (2.5 mg total) by mouth daily.   aspirin EC 81 MG tablet Take 81 mg by mouth daily.   carbidopa-levodopa (SINEMET IR) 25-100 MG tablet Take 1 tablet three times a day with meals   Evolocumab with Infusor (REPATHA PUSHTRONEX SYSTEM) 420 MG/3.5ML SOCT Inject 3.5 mLs into the skin every 30 (thirty) days.   glipiZIDE (GLUCOTROL) 5 MG tablet Take 1 tablet (5 mg total) by mouth 2 (two) times daily before a meal.   Lancets (BD LANCET ULTRAFINE 30G) MISC USE ONE  TO CHECK GLUCOSE ONCE DAILY   MAGNESIUM  CHLORIDE PO Take 250 mg by mouth daily.   memantine (NAMENDA) 10 MG tablet Take 1 tablet by mouth twice daily   nitroGLYCERIN (NITROSTAT) 0.4 MG SL tablet PLACE 1 TABLET UNDER THE TONGUE EVERY 5 MINUTES FOR 3 DOSES AS NEEDED FOR CHEST PAIN   ONETOUCH ULTRA test strip USE TO TEST DAILY   POTASSIUM GLUCONATE PO Take 99 mg by mouth daily.   prednisoLONE acetate (PRED FORTE) 1 % ophthalmic suspension Place 1 drop into the right eye daily.   vitamin  B-12 (CYANOCOBALAMIN) 1000 MCG tablet Take 1,000 mcg by mouth daily.   No facility-administered encounter medications on file as of 04/07/2022.  Fill History:   Dispensed Days Supply Quantity Provider Pharmacy  AMLODIPINE 2.5MG TAB 03/21/2022 30 30 each      Dispensed Days Supply Quantity Provider Pharmacy  CARB/LEVO 25-100MG  TAB 01/10/2022 90 270 each      Dispensed Days Supply Quantity Provider Pharmacy  REPATHA PUSH 420/3.5 INJ 04/06/2022 32 3.5 mL      Dispensed Days Supply Quantity Provider Pharmacy  GlipiZIDE 5MG       TAB 02/10/2022 90 180 each      Dispensed Days Supply Quantity Provider Pharmacy  MEMANTINE  10MG      TAB 01/10/2022 90 180 each      Dispensed Days Supply Quantity Provider Pharmacy  NITROGLYCERN SUB 0.4MG 06/09/2019 30 25 each      Dispensed Days Supply Quantity Provider Pharmacy  PREDNISOLONE ACETATE 1% SUS 03/21/2022 30 5 mL      Recent Office Vitals: BP Readings from Last 3 Encounters:  04/06/22 134/82  02/27/22 136/75  02/06/22 120/62   Pulse Readings from Last 3 Encounters:  04/06/22 (!) 48  02/27/22 68  02/06/22 (!) 55    Wt Readings from Last 3 Encounters:  04/06/22 217 lb 3.2 oz (98.5 kg)  03/30/22 217 lb (98.4 kg)  02/27/22 217 lb (98.4 kg)     Kidney Function Lab Results  Component Value Date/Time   CREATININE 1.31 11/11/2021 08:33 AM   CREATININE 1.46 11/07/2020 10:15 AM   CREATININE 1.26 (H) 11/07/2019 10:48 AM   CREATININE 1.18 03/06/2015 08:59 AM   GFR 50.95 (L) 11/11/2021 08:33 AM   GFRNONAA 55 (L) 06/01/2019 08:24 AM   GFRAA 64 06/01/2019 08:24 AM       Latest Ref Rng & Units 11/11/2021    8:33 AM 11/07/2020   10:15 AM 11/07/2019   10:48 AM  BMP  Glucose 70 - 99 mg/dL 147  148  129   BUN 6 - 23 mg/dL 26  21  24   $ Creatinine 0.40 - 1.50 mg/dL 1.31  1.46  1.26   BUN/Creat Ratio 6 - 22 (calc)   19   Sodium 135 - 145 mEq/L 137  137  138   Potassium 3.5 - 5.1 mEq/L 4.7  4.7  4.5   Chloride 96 - 112 mEq/L 104  101  107    CO2 19 - 32 mEq/L 23  27  24   $ Calcium 8.4 - 10.5 mg/dL 9.1  9.7  9.3    [place] [employer], theatre/television/film [phone]

## 2022-04-07 NOTE — Telephone Encounter (Signed)
Forwarded to [facility] [employer]

## 2022-04-15 ENCOUNTER — Encounter: Payer: Self-pay | Admitting: Cardiology

## 2022-04-15 NOTE — Assessment & Plan Note (Signed)
7 years out from most recent ACS presentation with two-vessel PCI.  No significant heart failure or angina symptoms since.  Not very active-limited by his Parkinson's.  Labile blood pressures have made titration of medications difficult as has bradycardia.

## 2022-04-15 NOTE — Assessment & Plan Note (Signed)
Labs from September look great on Repatha.  Memory issues certainly exacerbated by statins.  Both memory issues and myalgias improved being off statins.

## 2022-04-15 NOTE — Assessment & Plan Note (Signed)
Baseline bradycardia with evidence of chronotropic incompetence, but still had not been at the extent to make him a candidate for PPM.  Continue to monitor.  Low threshold to reassess with event monitor.

## 2022-04-15 NOTE — Assessment & Plan Note (Signed)
CAD dating back to the early 2000's with most recent PCI in January 2017-LAD and RCA. Thankfully, no active angina.  He is however not very active.  Limited by Parkinson's related balance issues. No beta-blocker because of profound bradycardia. He is now on aspirin monotherapy-notably less bruising. PCP recently added amlodipine 2.5 mg daily. He is remains on Repatha for lipid control.

## 2022-04-15 NOTE — Assessment & Plan Note (Addendum)
No active angina Continue aspirin and Repatha

## 2022-04-15 NOTE — Assessment & Plan Note (Signed)
Again, plan will be to allow for permissive hypertension.  He continues to work with balance physical therapy.

## 2022-04-15 NOTE — Assessment & Plan Note (Signed)
Blood pressures have been labile in the past.  Currently pressure seem to be little bit better and PCP started low-dose amlodipine.  Allowing for permissive hypertension.. Clearly cannot use beta-blocker because of bradycardia.

## 2022-05-17 DIAGNOSIS — Z961 Presence of intraocular lens: Secondary | ICD-10-CM | POA: Insufficient documentation

## 2022-05-17 DIAGNOSIS — Z947 Corneal transplant status: Secondary | ICD-10-CM | POA: Insufficient documentation

## 2022-05-18 DIAGNOSIS — H18512 Endothelial corneal dystrophy, left eye: Secondary | ICD-10-CM | POA: Diagnosis not present

## 2022-05-18 DIAGNOSIS — Z961 Presence of intraocular lens: Secondary | ICD-10-CM | POA: Diagnosis not present

## 2022-05-18 DIAGNOSIS — Z947 Corneal transplant status: Secondary | ICD-10-CM | POA: Diagnosis not present

## 2022-05-25 ENCOUNTER — Inpatient Hospital Stay (HOSPITAL_COMMUNITY)
Admission: EM | Admit: 2022-05-25 | Discharge: 2022-06-01 | DRG: 481 | Disposition: A | Discharge status: Skilled Nursing Facility | Payer: Medicare HMO | Attending: Internal Medicine | Admitting: Internal Medicine

## 2022-05-25 ENCOUNTER — Emergency Department (HOSPITAL_COMMUNITY): Payer: Medicare HMO

## 2022-05-25 ENCOUNTER — Encounter (HOSPITAL_COMMUNITY): Payer: Self-pay | Admitting: Pharmacy Technician

## 2022-05-25 ENCOUNTER — Other Ambulatory Visit: Payer: Self-pay

## 2022-05-25 DIAGNOSIS — Z66 Do not resuscitate: Secondary | ICD-10-CM | POA: Diagnosis present

## 2022-05-25 DIAGNOSIS — Z833 Family history of diabetes mellitus: Secondary | ICD-10-CM

## 2022-05-25 DIAGNOSIS — I2510 Atherosclerotic heart disease of native coronary artery without angina pectoris: Secondary | ICD-10-CM | POA: Diagnosis not present

## 2022-05-25 DIAGNOSIS — Z882 Allergy status to sulfonamides status: Secondary | ICD-10-CM | POA: Diagnosis not present

## 2022-05-25 DIAGNOSIS — E1165 Type 2 diabetes mellitus with hyperglycemia: Secondary | ICD-10-CM | POA: Diagnosis present

## 2022-05-25 DIAGNOSIS — G20A2 Parkinson's disease without dyskinesia, with fluctuations: Secondary | ICD-10-CM | POA: Diagnosis not present

## 2022-05-25 DIAGNOSIS — D72829 Elevated white blood cell count, unspecified: Secondary | ICD-10-CM | POA: Diagnosis present

## 2022-05-25 DIAGNOSIS — Z743 Need for continuous supervision: Secondary | ICD-10-CM | POA: Diagnosis not present

## 2022-05-25 DIAGNOSIS — R69 Illness, unspecified: Secondary | ICD-10-CM | POA: Diagnosis not present

## 2022-05-25 DIAGNOSIS — M25551 Pain in right hip: Secondary | ICD-10-CM | POA: Diagnosis not present

## 2022-05-25 DIAGNOSIS — E1122 Type 2 diabetes mellitus with diabetic chronic kidney disease: Secondary | ICD-10-CM | POA: Diagnosis present

## 2022-05-25 DIAGNOSIS — Z7982 Long term (current) use of aspirin: Secondary | ICD-10-CM | POA: Diagnosis not present

## 2022-05-25 DIAGNOSIS — S72002A Fracture of unspecified part of neck of left femur, initial encounter for closed fracture: Principal | ICD-10-CM

## 2022-05-25 DIAGNOSIS — Z7984 Long term (current) use of oral hypoglycemic drugs: Secondary | ICD-10-CM | POA: Diagnosis not present

## 2022-05-25 DIAGNOSIS — G8918 Other acute postprocedural pain: Secondary | ICD-10-CM | POA: Diagnosis not present

## 2022-05-25 DIAGNOSIS — Z79899 Other long term (current) drug therapy: Secondary | ICD-10-CM | POA: Diagnosis not present

## 2022-05-25 DIAGNOSIS — N1831 Chronic kidney disease, stage 3a: Secondary | ICD-10-CM | POA: Diagnosis not present

## 2022-05-25 DIAGNOSIS — K219 Gastro-esophageal reflux disease without esophagitis: Secondary | ICD-10-CM | POA: Diagnosis present

## 2022-05-25 DIAGNOSIS — F0284 Dementia in other diseases classified elsewhere, unspecified severity, with anxiety: Secondary | ICD-10-CM | POA: Diagnosis present

## 2022-05-25 DIAGNOSIS — E118 Type 2 diabetes mellitus with unspecified complications: Secondary | ICD-10-CM | POA: Diagnosis not present

## 2022-05-25 DIAGNOSIS — E1149 Type 2 diabetes mellitus with other diabetic neurological complication: Secondary | ICD-10-CM | POA: Diagnosis not present

## 2022-05-25 DIAGNOSIS — Z822 Family history of deafness and hearing loss: Secondary | ICD-10-CM

## 2022-05-25 DIAGNOSIS — E669 Obesity, unspecified: Secondary | ICD-10-CM | POA: Diagnosis not present

## 2022-05-25 DIAGNOSIS — S7292XK Unspecified fracture of left femur, subsequent encounter for closed fracture with nonunion: Secondary | ICD-10-CM | POA: Diagnosis present

## 2022-05-25 DIAGNOSIS — Z781 Physical restraint status: Secondary | ICD-10-CM

## 2022-05-25 DIAGNOSIS — W19XXXA Unspecified fall, initial encounter: Secondary | ICD-10-CM | POA: Diagnosis present

## 2022-05-25 DIAGNOSIS — R001 Bradycardia, unspecified: Secondary | ICD-10-CM | POA: Diagnosis not present

## 2022-05-25 DIAGNOSIS — Z955 Presence of coronary angioplasty implant and graft: Secondary | ICD-10-CM | POA: Diagnosis not present

## 2022-05-25 DIAGNOSIS — Z043 Encounter for examination and observation following other accident: Secondary | ICD-10-CM | POA: Diagnosis not present

## 2022-05-25 DIAGNOSIS — G20A1 Parkinson's disease without dyskinesia, without mention of fluctuations: Secondary | ICD-10-CM | POA: Diagnosis present

## 2022-05-25 DIAGNOSIS — M25522 Pain in left elbow: Secondary | ICD-10-CM | POA: Diagnosis present

## 2022-05-25 DIAGNOSIS — S199XXA Unspecified injury of neck, initial encounter: Secondary | ICD-10-CM | POA: Diagnosis not present

## 2022-05-25 DIAGNOSIS — Z4789 Encounter for other orthopedic aftercare: Secondary | ICD-10-CM | POA: Diagnosis not present

## 2022-05-25 DIAGNOSIS — Z888 Allergy status to other drugs, medicaments and biological substances status: Secondary | ICD-10-CM | POA: Diagnosis not present

## 2022-05-25 DIAGNOSIS — Z6833 Body mass index (BMI) 33.0-33.9, adult: Secondary | ICD-10-CM

## 2022-05-25 DIAGNOSIS — Z471 Aftercare following joint replacement surgery: Secondary | ICD-10-CM | POA: Diagnosis not present

## 2022-05-25 DIAGNOSIS — I252 Old myocardial infarction: Secondary | ICD-10-CM | POA: Diagnosis not present

## 2022-05-25 DIAGNOSIS — R9431 Abnormal electrocardiogram [ECG] [EKG]: Secondary | ICD-10-CM | POA: Diagnosis not present

## 2022-05-25 DIAGNOSIS — G309 Alzheimer's disease, unspecified: Secondary | ICD-10-CM | POA: Diagnosis present

## 2022-05-25 DIAGNOSIS — Y92015 Private garage of single-family (private) house as the place of occurrence of the external cause: Secondary | ICD-10-CM

## 2022-05-25 DIAGNOSIS — S0990XA Unspecified injury of head, initial encounter: Secondary | ICD-10-CM | POA: Diagnosis not present

## 2022-05-25 DIAGNOSIS — S79911A Unspecified injury of right hip, initial encounter: Secondary | ICD-10-CM | POA: Diagnosis not present

## 2022-05-25 DIAGNOSIS — Z7189 Other specified counseling: Secondary | ICD-10-CM | POA: Diagnosis not present

## 2022-05-25 DIAGNOSIS — S72002K Fracture of unspecified part of neck of left femur, subsequent encounter for closed fracture with nonunion: Secondary | ICD-10-CM | POA: Diagnosis not present

## 2022-05-25 DIAGNOSIS — Z85828 Personal history of other malignant neoplasm of skin: Secondary | ICD-10-CM

## 2022-05-25 DIAGNOSIS — S72142A Displaced intertrochanteric fracture of left femur, initial encounter for closed fracture: Principal | ICD-10-CM | POA: Diagnosis present

## 2022-05-25 DIAGNOSIS — Z8249 Family history of ischemic heart disease and other diseases of the circulatory system: Secondary | ICD-10-CM

## 2022-05-25 DIAGNOSIS — F0282 Dementia in other diseases classified elsewhere, unspecified severity, with psychotic disturbance: Secondary | ICD-10-CM | POA: Diagnosis present

## 2022-05-25 DIAGNOSIS — Z9861 Coronary angioplasty status: Secondary | ICD-10-CM | POA: Diagnosis not present

## 2022-05-25 DIAGNOSIS — G8911 Acute pain due to trauma: Secondary | ICD-10-CM | POA: Diagnosis not present

## 2022-05-25 DIAGNOSIS — I10 Essential (primary) hypertension: Secondary | ICD-10-CM | POA: Diagnosis present

## 2022-05-25 DIAGNOSIS — S72042A Displaced fracture of base of neck of left femur, initial encounter for closed fracture: Secondary | ICD-10-CM | POA: Diagnosis not present

## 2022-05-25 DIAGNOSIS — F17210 Nicotine dependence, cigarettes, uncomplicated: Secondary | ICD-10-CM | POA: Diagnosis not present

## 2022-05-25 DIAGNOSIS — F17290 Nicotine dependence, other tobacco product, uncomplicated: Secondary | ICD-10-CM | POA: Diagnosis present

## 2022-05-25 DIAGNOSIS — M25519 Pain in unspecified shoulder: Secondary | ICD-10-CM | POA: Diagnosis not present

## 2022-05-25 DIAGNOSIS — M1712 Unilateral primary osteoarthritis, left knee: Secondary | ICD-10-CM | POA: Diagnosis not present

## 2022-05-25 DIAGNOSIS — Z515 Encounter for palliative care: Secondary | ICD-10-CM | POA: Diagnosis not present

## 2022-05-25 LAB — COMPREHENSIVE METABOLIC PANEL
ALT: 20 U/L (ref 0–44)
AST: 26 U/L (ref 15–41)
Albumin: 4.3 g/dL (ref 3.5–5.0)
Alkaline Phosphatase: 62 U/L (ref 38–126)
Anion gap: 12 (ref 5–15)
BUN: 22 mg/dL (ref 8–23)
CO2: 22 mmol/L (ref 22–32)
Calcium: 9.1 mg/dL (ref 8.9–10.3)
Chloride: 105 mmol/L (ref 98–111)
Creatinine, Ser: 1.35 mg/dL — ABNORMAL HIGH (ref 0.61–1.24)
GFR, Estimated: 52 mL/min — ABNORMAL LOW (ref >60)
Glucose, Bld: 179 mg/dL — ABNORMAL HIGH (ref 70–99)
Potassium: 4 mmol/L (ref 3.5–5.1)
Sodium: 139 mmol/L (ref 135–145)
Total Bilirubin: 0.5 mg/dL (ref 0.3–1.2)
Total Protein: 7.4 g/dL (ref 6.5–8.1)

## 2022-05-25 LAB — URINALYSIS, ROUTINE W REFLEX MICROSCOPIC
Bacteria, UA: NONE SEEN
Bilirubin Urine: NEGATIVE
Glucose, UA: 150 mg/dL — AB
Ketones, ur: NEGATIVE mg/dL
Leukocytes,Ua: NEGATIVE
Nitrite: NEGATIVE
Protein, ur: 30 mg/dL — AB
Specific Gravity, Urine: 1.017 (ref 1.005–1.030)
pH: 5 (ref 5.0–8.0)

## 2022-05-25 LAB — CBC WITH DIFFERENTIAL/PLATELET
Abs Immature Granulocytes: 0.08 10*3/uL — ABNORMAL HIGH (ref 0.00–0.07)
Basophils Absolute: 0 10*3/uL (ref 0.0–0.1)
Basophils Relative: 0 %
Eosinophils Absolute: 0 10*3/uL (ref 0.0–0.5)
Eosinophils Relative: 0 %
HCT: 45.4 % (ref 39.0–52.0)
Hemoglobin: 15.1 g/dL (ref 13.0–17.0)
Immature Granulocytes: 1 %
Lymphocytes Relative: 8 %
Lymphs Abs: 1.1 10*3/uL (ref 0.7–4.0)
MCH: 31.8 pg (ref 26.0–34.0)
MCHC: 33.3 g/dL (ref 30.0–36.0)
MCV: 95.6 fL (ref 80.0–100.0)
Monocytes Absolute: 0.7 10*3/uL (ref 0.1–1.0)
Monocytes Relative: 5 %
Neutro Abs: 12.4 10*3/uL — ABNORMAL HIGH (ref 1.7–7.7)
Neutrophils Relative %: 86 %
Platelets: 185 10*3/uL (ref 150–400)
RBC: 4.75 MIL/uL (ref 4.22–5.81)
RDW: 12.6 % (ref 11.5–15.5)
WBC: 14.3 10*3/uL — ABNORMAL HIGH (ref 4.0–10.5)
nRBC: 0 % (ref 0.0–0.2)

## 2022-05-25 MED ORDER — HYDROCODONE-ACETAMINOPHEN 5-325 MG PO TABS
1.0000 | ORAL_TABLET | Freq: Four times a day (QID) | ORAL | Status: DC | PRN
  Administered 2022-05-25: 2 via ORAL
  Administered 2022-05-28 – 2022-05-29 (×2): 1 via ORAL
  Administered 2022-05-29: 2 via ORAL
  Administered 2022-05-30 (×2): 1 via ORAL
  Filled 2022-05-25: qty 2
  Filled 2022-05-25 (×4): qty 1
  Filled 2022-05-25: qty 2
  Filled 2022-05-25: qty 1

## 2022-05-25 MED ORDER — FENTANYL CITRATE PF 50 MCG/ML IJ SOSY
50.0000 ug | PREFILLED_SYRINGE | Freq: Once | INTRAMUSCULAR | Status: AC
  Administered 2022-05-25: 50 ug via INTRAVENOUS
  Filled 2022-05-25: qty 1

## 2022-05-25 MED ORDER — LACTATED RINGERS IV SOLN: INTRAVENOUS | Status: DC

## 2022-05-25 MED ORDER — HYDROMORPHONE HCL 1 MG/ML IJ SOLN
0.5000 mg | INTRAMUSCULAR | Status: DC | PRN
  Administered 2022-05-26 – 2022-05-27 (×2): 0.5 mg via INTRAVENOUS
  Filled 2022-05-25 (×3): qty 0.5

## 2022-05-25 MED ORDER — ONDANSETRON HCL 4 MG/2ML IJ SOLN
4.0000 mg | Freq: Four times a day (QID) | INTRAMUSCULAR | Status: DC | PRN
  Filled 2022-05-25: qty 2

## 2022-05-25 MED ORDER — INSULIN ASPART 100 UNIT/ML IJ SOLN
0.0000 [IU] | INTRAMUSCULAR | Status: DC
  Administered 2022-05-26: 3 [IU] via SUBCUTANEOUS
  Administered 2022-05-26: 5 [IU] via SUBCUTANEOUS
  Administered 2022-05-26 (×3): 2 [IU] via SUBCUTANEOUS
  Administered 2022-05-26 (×2): 3 [IU] via SUBCUTANEOUS
  Administered 2022-05-27: 2 [IU] via SUBCUTANEOUS
  Administered 2022-05-27 (×2): 1 [IU] via SUBCUTANEOUS
  Administered 2022-05-27 (×2): 2 [IU] via SUBCUTANEOUS
  Administered 2022-05-27: 3 [IU] via SUBCUTANEOUS
  Administered 2022-05-28: 1 [IU] via SUBCUTANEOUS
  Administered 2022-05-28: 2 [IU] via SUBCUTANEOUS
  Administered 2022-05-28: 3 [IU] via SUBCUTANEOUS
  Administered 2022-05-28 – 2022-05-29 (×4): 2 [IU] via SUBCUTANEOUS
  Administered 2022-05-29: 1 [IU] via SUBCUTANEOUS
  Administered 2022-05-30: 3 [IU] via SUBCUTANEOUS
  Administered 2022-05-30: 1 [IU] via SUBCUTANEOUS
  Administered 2022-05-30 (×2): 2 [IU] via SUBCUTANEOUS
  Administered 2022-05-30 – 2022-05-31 (×3): 1 [IU] via SUBCUTANEOUS
  Administered 2022-05-31: 2 [IU] via SUBCUTANEOUS
  Administered 2022-05-31: 3 [IU] via SUBCUTANEOUS
  Administered 2022-05-31: 2 [IU] via SUBCUTANEOUS
  Administered 2022-05-31: 3 [IU] via SUBCUTANEOUS
  Administered 2022-06-01 (×3): 2 [IU] via SUBCUTANEOUS
  Filled 2022-05-25: qty 0.09

## 2022-05-25 MED ORDER — SENNOSIDES-DOCUSATE SODIUM 8.6-50 MG PO TABS: 1.0000 | ORAL_TABLET | Freq: Every evening | ORAL | Status: DC | PRN

## 2022-05-25 NOTE — ED Provider Notes (Signed)
[place] [facility] AT [facility] Provider Note   CSN: [id number] Arrival date & time: 05/25/22  1722     History  Chief Complaint  Patient presents with   Joe Murray is a 82 y.o. male.  82 year old male with history dementia presents after witnessed fall.  According to EMS, patient is at his baseline with regards to his normal status.  Complains of left hip and left knee pain.  Also notes left shoulder pain.  Patient denies any head or neck pain at this time.  Presents via EMS       Home Medications Prior to Admission medications   Medication Sig Start Date End Date Taking? Authorizing Provider  amLODipine (NORVASC) 2.5 MG tablet Take 1 tablet (2.5 mg total) by mouth daily. 01/23/22   Laurey Morale, MD  aspirin EC 81 MG tablet Take 81 mg by mouth daily.    [provider]  carbidopa-levodopa (SINEMET IR) 25-100 MG tablet Take 1 tablet three times a day with meals 08/27/21   Cameron Sprang, MD  Evolocumab with Infusor ([place]) 420 MG/3.5ML SOCT Inject 3.5 mLs into the skin every 30 (thirty) days. 04/06/22   Leonie Man, MD  glipiZIDE (GLUCOTROL) 5 MG tablet Take 1 tablet (5 mg total) by mouth 2 (two) times daily before a meal. 11/11/21   Laurey Morale, MD  Lancets (BD LANCET ULTRAFINE 30G) MISC USE ONE  TO CHECK GLUCOSE ONCE DAILY 03/03/16   Laurey Morale, MD  MAGNESIUM CHLORIDE PO Take 250 mg by mouth daily.    [provider]  memantine (NAMENDA) 10 MG tablet Take 1 tablet by mouth twice daily 08/27/21   Cameron Sprang, MD  nitroGLYCERIN (NITROSTAT) 0.4 MG SL tablet PLACE 1 TABLET UNDER THE TONGUE EVERY 5 MINUTES FOR 3 DOSES AS NEEDED FOR CHEST PAIN 06/09/19   Leonie Man, MD  [facility] ULTRA test strip USE TO TEST DAILY 04/07/21   Laurey Morale, MD  POTASSIUM GLUCONATE PO Take 99 mg by mouth daily.    [provider]  prednisoLONE acetate (PRED FORTE) 1 % ophthalmic suspension Place 1 drop  into the right eye daily.    [provider]  vitamin B-12 (CYANOCOBALAMIN) 1000 MCG tablet Take 1,000 mcg by mouth daily.    [provider]      Allergies    Beta adrenergic blockers, Praluent [alirocumab], Sulfonamide derivatives, and Statins    Review of Systems   Review of Systems  Unable to perform ROS: Dementia    Physical Exam Updated Vital Signs BP (!) 140/76   Pulse 75   Temp 98.4 F (36.9 C)   Resp 20   SpO2 95%  Physical Exam Vitals and nursing note reviewed.  Constitutional:      General: He is not in acute distress.    Appearance: Normal appearance. He is well-developed. He is not toxic-appearing.  HENT:     Head: Normocephalic and atraumatic.  Eyes:     General: Lids are normal.     Conjunctiva/sclera: Conjunctivae normal.     Pupils: Pupils are equal, round, and reactive to light.  Neck:     Thyroid: No thyroid mass.     Trachea: No tracheal deviation.  Cardiovascular:     Rate and Rhythm: Normal rate and regular rhythm.     Heart sounds: Normal heart sounds. No murmur heard.    No gallop.  Pulmonary:  Effort: Pulmonary effort is normal. No respiratory distress.     Breath sounds: Normal breath sounds. No stridor. No decreased breath sounds, wheezing, rhonchi or rales.  Abdominal:     General: There is no distension.     Palpations: Abdomen is soft.     Tenderness: There is no abdominal tenderness. There is no rebound.  Musculoskeletal:        General: No tenderness. Normal range of motion.     Cervical back: Normal range of motion and neck supple.     Comments: Left lower extremity shortened and internally rotated.  Contusion noted to left knee  Skin:    General: Skin is warm and dry.     Findings: No abrasion or rash.  Neurological:     General: No focal deficit present.     Mental Status: He is alert and oriented to person, place, and time. Mental status is at baseline. He is confused.     GCS: GCS eye subscore is 4. GCS  verbal subscore is 5. GCS motor subscore is 6.     Cranial Nerves: No cranial nerve deficit.     Sensory: No sensory deficit.     Motor: Motor function is intact.  Psychiatric:        Attention and Perception: Attention normal.        Speech: Speech normal.        Behavior: Behavior normal.     ED Results / Procedures / Treatments   Labs (all labs ordered are listed, but only abnormal results are displayed) Labs Reviewed  CBC WITH DIFFERENTIAL/PLATELET  URINALYSIS, ROUTINE W REFLEX MICROSCOPIC  COMPREHENSIVE METABOLIC PANEL    EKG EKG Interpretation  Date/Time:  Monday May 25 2022 17:31:41 EDT Ventricular Rate:  76 PR Interval:  159 QRS Duration: 118 QT Interval:  409 QTC Calculation: 460 R Axis:   -61 Text Interpretation: Sinus rhythm Left anterior fascicular block ST elevation, consider inferior injury Confirmed by Lacretia Leigh ([id number]) on 05/25/2022 5:49:34 PM  Radiology No results found.  Procedures Procedures    Medications Ordered in ED Medications  lactated ringers infusion (has no administration in time range)    ED Course/ Medical Decision Making/ A&P                             Medical Decision Making Amount and/or Complexity of Data Reviewed Labs: ordered. Radiology: ordered.  Risk Prescription drug management.   Patient is EKG per interpretation shows normal sinus rhythm.  No signs of acute ischemic changes.  Patient medicated for pain.  Patient clinically had hip fracture which was confirmed by x-ray of his left hip which did show a femoral neck fracture.  Due to the history of trauma being unwitnessed, patient had a CT of his head and cervical spine which per my review interpretation not show any acute findings.  Discussed with orthopedist on-call, Dr. Lynann Bologna, who requested patient stay at [facility] and he will have one of his partners see the patient.  CT of right hip ordered as per his request        Final Clinical Impression(s) / ED  Diagnoses Final diagnoses:  None    Rx / DC Orders ED Discharge Orders     None         Lacretia Leigh, MD 05/25/22 2111

## 2022-05-25 NOTE — Progress Notes (Signed)
I was consulted regarding this patient and have called the call-back number given to me 8 times with no answer. I was then recontacted by the answering service and they double checked the number and confirmed that they were given the number [phone]. I then called the number again and again there was no answer.

## 2022-05-25 NOTE — ED Triage Notes (Signed)
Pt bib ems from home with reports of unwitnessed fall approx 1600 today. Pt altered at baseline due to dementia. Pt complains of L leg, hip, elbow and shoulder pain. ?shortening to LLE. CNS intact. VSS with ems.

## 2022-05-25 NOTE — H&P (Incomplete)
History and Physical    Joe Murray [id number] DOB: Nov 08, 1940 DOA: 05/25/2022  PCP: Laurey Morale, MD  Patient coming from: Home  I have personally briefly reviewed patient's old medical records in [place]  Chief Complaint: Fall with left hip pain  HPI: Joe Murray is a 82 y.o. male with medical history significant for CAD s/p PCI, Parkinson's disease, dementia, T2DM, HTN, CKD stage IIIa, sinus bradycardia who presented to the ED for evaluation of left hip pain after a fall.  History is limited from patient due to dementia and is otherwise supplemented by EDP, chart review, and spouse at bedside.  Spouse states that patient had an unwitnessed fall in his garage.  He is unclear how long he was on the ground before she found him.  He was complaining of left hip pain.  EMS were called and he was brought to the ED for further evaluation.  Patient has poor ambulatory function at baseline due to his Parkinson's disease and is usually use a walker to ambulate.  ED Course  Labs/Imaging on admission: I have personally reviewed following labs and imaging studies.  Initial vital showed BP 140/76, pulse 74, RR 20, temp 98.4 F, SpO2 95% on room air.  Labs show WBC 14.3, hemoglobin 15.1, platelets 185,000, sodium 139, potassium 4.0, bicarb 22, BUN 22, creatinine 1.35, serum glucose 179, LFTs within normal limits.  Urinalysis negative for UTI.  CT head without contrast negative for acute intracranial abnormality.  CT cervical spine without contrast shows degenerative changes of the cervical spine without acute osseous abnormality.  2 view chest x-ray shows mildly decreased lung volumes without acute cardiopulmonary process.  Left hip x-ray shows acute mildly displaced fracture of the proximal left femoral neck.  Left knee x-ray negative for acute fracture.  CT right hip negative for acute fracture or dislocation.  CT left hip ordered and pending.  Patient was given  fentanyl 50 mcg.  EDP spoke with on-call orthopedics, Dr. Lynann Bologna.  Their team will see tomorrow.  The hospitalist service was consulted to admit for further evaluation and management.  Review of Systems:  Unable to obtain full review of systems due to dementia.   Past Medical History:  Diagnosis Date   Alzheimer's dementia    sees Dr. Ellouise Newer   CAD S/P percutaneous coronary angioplasty 10/'03; 3/'09; 7/*09; 1/'17    a. Dr. Ellyn Hack; '03 - Cypher DES 3.0 mm 33 mm proximal-mid LAD (details D1 with ostial 60%); 3/'09 dRCA 2.75 mm x 13 mm Cypher DES (2.8 mm); 7/'09 pRCA 3.0 mm x 18 m Cypher DES (3.25 mm);; (2009) 2D Echo - EF 55%, (November 2014) nonischemic Myoview;; b. Promus DES to RCA and mid LAD 02/26/2015   Chronotropic incompetence 12/2011   ETT 11/2017: Normal blood pressure response.  No EKG changes.  Exercise stopped due to fatigue and dyspnea.  Heart rate increased to low 90s from 50s.  Suggest chronotropic incompetence.  Severely impaired exercise capacity. -->  Results reviewed with electrophysiology: Still not indication for pacemaker.   Diabetes mellitus type 2 in obese    CAD   Diverticulosis    Dyslipidemia, goal LDL below 70    on [employer] study medicaion.   ED (erectile dysfunction)    GERD (gastroesophageal reflux disease)    Hemorrhoids 07/2002   Internal and External   Insomnia    Nodular basal cell carcinoma (BCC) 05/08/2019   left ear (cx20fu)   Non-STEMI (non-ST elevated myocardial infarction) 11/2001  a. Proximal LAD tandem lesions -- long DES stent covering both;; b. Jan 2017: mRCA PCI, mLAD PCI   Parkinson's disease    sees Dr. Ellouise Newer   Persistent sinus bradycardia    Personal history of colonic adenomas 09/06/2012   Psoriasis    Resting HR in 54s   SCC (squamous cell carcinoma) 11/13/2001   right upper outer arm (txpbx)   SCC (squamous cell carcinoma) 11/13/2009   right upper ear rim (cx3 55fu)   SCC (squamous cell carcinoma) 01/20/2010    right nose (cx37fu)   SCC (squamous cell carcinoma) 03/28/2014   right side of nose ( cx34fu)   SCC (squamous cell carcinoma) 06/03/2016   right side of nose   SCC (squamous cell carcinoma) 05/08/2019   right ear inferior (cx7fu) cis   SCCA (squamous cell carcinoma) of skin 04/23/2021   Right Temporal Scalp (well diff) (tx p bx)   Spasm of esophagus    Squamous cell carcinoma in situ (SCCIS) 05/08/2019   Right Ear Inferior   Squamous cell carcinoma of skin 11/08/2001   top right rim ear    SVT (supraventricular tachycardia) 2003   Vertigo, benign positional     Past Surgical History:  Procedure Laterality Date   CARDIAC CATHETERIZATION N/A 02/26/2015   Procedure: Left Heart Cath and Coronary Angiography;  Surgeon: Leonie Man, MD: mRCA 99% --> PCI. mLAD 75%-> FFR Guided PCI, Mod ISR in pLAD & pRCA   CARDIAC CATHETERIZATION N/A 02/26/2015   Procedure: Coronary Stent Intervention;  Surgeon: Leonie Man, MD;  Location: [facility]: mRCA Promus Premier DES 3.0 x 12 (3.5), mLAD Promus Premier DES 2.75 x 20 (3.0)   CARDIAC CATHETERIZATION N/A 02/26/2015   Procedure: Intravascular Pressure Wire/FFR Study;  Surgeon: Leonie Man, MD;  Location: [facility];  Service: Cardiovascular: LAD 75% - FFR + -> PCI   CATARACT EXTRACTION  [id number]   COLONOSCOPY  12/16/2017   per Dr. Carlean Purl, adenomatous polyps, no repeats due to age    [place]  LAD - 2003, RCA 3 & 7/ '09   Cypher 3.0 x 32 mid LAD; Cyper 2.75 x 13 - distal RCA, 3.0 x 18 Prox RCA   ESOPHAGOGASTRODUODENOSCOPY  08-11-02   esophageal dilation per Dr. Carlean Purl   HERNIA REPAIR     right inguinal    NM MYOVIEW LTD  Nov 2014   ~8 METS; EF 60%, no ischemia or infarction   PERCUTANEOUS CORONARY STENT INTERVENTION (PCI-S)  11/23/2001   NSTEMI: Prox-Mid LAD tandem ~80% lesions on either side of D1 (with 80% lesion) -- Cypher DES 3.0 mm x 33 m (covering both lesions)    PERCUTANEOUS CORONARY  STENT INTERVENTION (PCI-S)  04/27/2007   Unstable Angina: Distal RCA 95%: Cypher 2.75x13  (2.8 mm); residual focal ~60%ISR in LAD stent, 60% D1 ostial (no PTCA on LAD due to no ischemia on ST)   PERCUTANEOUS CORONARY STENT INTERVENTION (PCI-S)  09/21/2007   Bradycardia & Unstable Angina: Prox RCA 70% - PCI Cypher DES 3.0 mm x 18 mm  (3.25 mm); ostial 60-70% jailed D1. LAD & distal RCA stents patent   SHOULDER SURGERY     left rotator cuff, Dr. Gladstone Lighter   TRANSTHORACIC ECHOCARDIOGRAM  March 2009   Normal LV size and function, EF 55%. Mild MR; mild RV dilation.   TRANSTHORACIC ECHOCARDIOGRAM  02/2015   EF 60-65%. Moderate concentric LVH. Normal function with normal regional wall motion. GR 1 DD  WRIST FRACTURE SURGERY     right    Social History:  reports that he has been smoking cigars. He started smoking about 69 years ago. He has never used smokeless tobacco. He reports current alcohol use. He reports that he does not use drugs.  Allergies  Allergen Reactions   Beta Adrenergic Blockers Other (See Comments)    chronotropic incompetence   Praluent [Alirocumab]     Diffuse Rash - improved when stopping & taking Benadryl   Sulfonamide Derivatives     unknown   Statins Other (See Comments)    REACTION: myalgias    Family History  Problem Relation Age of Onset   Diabetes Mother    Hearing loss Father    Dementia Father    Heart attack Father 43       first MI prior to age 21   Heart attack Brother      Prior to Admission medications   Medication Sig Start Date End Date Taking? Authorizing Provider  amLODipine (NORVASC) 2.5 MG tablet Take 1 tablet (2.5 mg total) by mouth daily. 01/23/22   Laurey Morale, MD  aspirin EC 81 MG tablet Take 81 mg by mouth daily.    [provider]  carbidopa-levodopa (SINEMET IR) 25-100 MG tablet Take 1 tablet three times a day with meals 08/27/21   Cameron Sprang, MD  Evolocumab with Infusor ([place]) 420 MG/3.5ML SOCT Inject  3.5 mLs into the skin every 30 (thirty) days. 04/06/22   Leonie Man, MD  glipiZIDE (GLUCOTROL) 5 MG tablet Take 1 tablet (5 mg total) by mouth 2 (two) times daily before a meal. 11/11/21   Laurey Morale, MD  Lancets (BD LANCET ULTRAFINE 30G) MISC USE ONE  TO CHECK GLUCOSE ONCE DAILY 03/03/16   Laurey Morale, MD  MAGNESIUM CHLORIDE PO Take 250 mg by mouth daily.    [provider]  memantine (NAMENDA) 10 MG tablet Take 1 tablet by mouth twice daily 08/27/21   Cameron Sprang, MD  nitroGLYCERIN (NITROSTAT) 0.4 MG SL tablet PLACE 1 TABLET UNDER THE TONGUE EVERY 5 MINUTES FOR 3 DOSES AS NEEDED FOR CHEST PAIN 06/09/19   Leonie Man, MD  [facility] ULTRA test strip USE TO TEST DAILY 04/07/21   Laurey Morale, MD  POTASSIUM GLUCONATE PO Take 99 mg by mouth daily.    [provider]  prednisoLONE acetate (PRED FORTE) 1 % ophthalmic suspension Place 1 drop into the right eye daily.    [provider]  vitamin B-12 (CYANOCOBALAMIN) 1000 MCG tablet Take 1,000 mcg by mouth daily.    [provider]    Physical Exam: Vitals:   05/25/22 2100 05/25/22 2323 05/25/22 2324 05/26/22 0007  BP: (!) 142/115 (!) 149/65  (!) 159/66  Pulse: 71 65 65 65  Resp: (!) 21 20  18   Temp:  98.1 F (36.7 C)  98.8 F (37.1 C)  TempSrc:    Oral  SpO2: 94% 94% 94% 96%   Constitutional: Resting in bed, NAD, calm, comfortable Eyes: EOMI, lids and conjunctivae normal ENMT: Mucous membranes are dry. Posterior pharynx clear of any exudate or lesions.Normal dentition.  Neck: normal, supple, no masses. Respiratory: clear to auscultation anteriorly. Normal respiratory effort. No accessory muscle use.  Cardiovascular: Regular rate and rhythm, no murmurs / rubs / gallops. No extremity edema. 2+ pedal pulses. Abdomen: no tenderness, no masses palpated. Musculoskeletal: no clubbing / cyanosis. ROM diminished LLE due to hip fracture.  Skin: no rashes, lesions, ulcers. No induration Neurologic:  Sensation intact. Strength intact bilateral upper extremities, diminished lower extremities due to left hip fracture. Psychiatric: Awake and alert, oriented to self only.  EKG: Personally reviewed. Sinus rhythm, rate 76, LAFB, motion artifact.  Previous EKG showed sinus bradycardia, rate 48 with sinus arrhythmia.  Assessment/Plan Principal Problem:   Closed intertrochanteric fracture of left femur, initial encounter Active Problems:   CAD S/P percutaneous coronary angioplasty - LADx2, & RCA x 3   DM (diabetes mellitus), type 2 with complications - CAD   Parkinson's disease   Chronic kidney disease, stage 3a   Joe Murray is a 82 y.o. male with medical history significant for CAD s/p PCI, Parkinson's disease, dementia, T2DM, HTN, CKD stage IIIa, sinus bradycardia who is admitted with acute left femoral neck fracture.  Assessment and Plan: Acute fracture of the left femoral neck: Occurring after unwitnessed fall.  Orthopedics to consult in AM.  History of CAD with 6.0% 30-day perioperative risk for death, MI, or cardiac arrest based on RCRI. -N.p.o. after midnight -Continue analgesics as needed -Surgical fixation per orthopedics  CKD stage IIIa: Renal function stable.  Type 2 diabetes: Placed on SSI q4h while NPO.  Leukocytosis: Likely reactive in setting of hip fracture.  No obvious infection.  CAD: Stable without chest pain.  Follows with cardiology, Dr. Ellyn Hack.  Most recent PCI in January 2017 with DES to RCA and mid LAD. -Hold aspirin for now -Not on beta-blocker due to history of bradycardia -On Repatha as an outpatient, not on statin due to myalgias  Hypertension: Continue amlodipine.  Parkinson's disease: Continue Sinemet.  Dementia: Continue memantine.   DVT prophylaxis: SCDs Start: 05/25/22 2336 Code Status: Full code, discussed with spouse on admission Family Communication: Spouse at bedside Disposition Plan: From home, dispo pending clinical  progress Consults called: Orthopedics Severity of Illness: The appropriate patient status for this patient is INPATIENT. Inpatient status is judged to be reasonable and necessary in order to provide the required intensity of service to ensure the patient's safety. The patient's presenting symptoms, physical exam findings, and initial radiographic and laboratory data in the context of their chronic comorbidities is felt to place them at high risk for further clinical deterioration. Furthermore, it is not anticipated that the patient will be medically stable for discharge from the hospital within 2 midnights of admission.   * I certify that at the point of admission it is my clinical judgment that the patient will require inpatient hospital care spanning beyond 2 midnights from the point of admission due to high intensity of service, high risk for further deterioration and high frequency of surveillance required.Zada Finders MD [facility]  If 7PM-7AM, please contact night-coverage [email or web address]  05/26/2022, 12:51 AM

## 2022-05-26 ENCOUNTER — Inpatient Hospital Stay (HOSPITAL_COMMUNITY): Payer: Medicare HMO

## 2022-05-26 ENCOUNTER — Inpatient Hospital Stay (HOSPITAL_COMMUNITY): Payer: Medicare HMO | Admitting: Anesthesiology

## 2022-05-26 DIAGNOSIS — G20A2 Parkinson's disease without dyskinesia, with fluctuations: Secondary | ICD-10-CM

## 2022-05-26 DIAGNOSIS — E118 Type 2 diabetes mellitus with unspecified complications: Secondary | ICD-10-CM | POA: Diagnosis not present

## 2022-05-26 DIAGNOSIS — N1831 Chronic kidney disease, stage 3a: Secondary | ICD-10-CM | POA: Diagnosis not present

## 2022-05-26 DIAGNOSIS — S72142A Displaced intertrochanteric fracture of left femur, initial encounter for closed fracture: Secondary | ICD-10-CM | POA: Diagnosis not present

## 2022-05-26 LAB — SURGICAL PCR SCREEN
MRSA, PCR: NEGATIVE
Staphylococcus aureus: NEGATIVE

## 2022-05-26 LAB — CBC
HCT: 40 % (ref 39.0–52.0)
Hemoglobin: 13.4 g/dL (ref 13.0–17.0)
MCH: 31.8 pg (ref 26.0–34.0)
MCHC: 33.5 g/dL (ref 30.0–36.0)
MCV: 95 fL (ref 80.0–100.0)
Platelets: 163 10*3/uL (ref 150–400)
RBC: 4.21 MIL/uL — ABNORMAL LOW (ref 4.22–5.81)
RDW: 12.6 % (ref 11.5–15.5)
WBC: 12.5 10*3/uL — ABNORMAL HIGH (ref 4.0–10.5)
nRBC: 0 % (ref 0.0–0.2)

## 2022-05-26 LAB — BASIC METABOLIC PANEL
Anion gap: 9 (ref 5–15)
BUN: 20 mg/dL (ref 8–23)
CO2: 23 mmol/L (ref 22–32)
Calcium: 8.8 mg/dL — ABNORMAL LOW (ref 8.9–10.3)
Chloride: 104 mmol/L (ref 98–111)
Creatinine, Ser: 1.24 mg/dL (ref 0.61–1.24)
GFR, Estimated: 58 mL/min — ABNORMAL LOW (ref >60)
Glucose, Bld: 209 mg/dL — ABNORMAL HIGH (ref 70–99)
Potassium: 3.6 mmol/L (ref 3.5–5.1)
Sodium: 136 mmol/L (ref 135–145)

## 2022-05-26 LAB — TYPE AND SCREEN
ABO/RH(D): A POS
Antibody Screen: NEGATIVE

## 2022-05-26 LAB — GLUCOSE, CAPILLARY
Glucose-Capillary: 164 mg/dL — ABNORMAL HIGH (ref 70–99)
Glucose-Capillary: 174 mg/dL — ABNORMAL HIGH (ref 70–99)
Glucose-Capillary: 189 mg/dL — ABNORMAL HIGH (ref 70–99)
Glucose-Capillary: 210 mg/dL — ABNORMAL HIGH (ref 70–99)
Glucose-Capillary: 214 mg/dL — ABNORMAL HIGH (ref 70–99)
Glucose-Capillary: 230 mg/dL — ABNORMAL HIGH (ref 70–99)
Glucose-Capillary: 262 mg/dL — ABNORMAL HIGH (ref 70–99)

## 2022-05-26 LAB — ABO/RH: ABO/RH(D): A POS

## 2022-05-26 MED ORDER — CARBIDOPA-LEVODOPA 25-100 MG PO TABS
1.0000 | ORAL_TABLET | Freq: Three times a day (TID) | ORAL | Status: DC
  Administered 2022-05-26 – 2022-05-31 (×14): 1 via ORAL
  Filled 2022-05-26 (×19): qty 1

## 2022-05-26 MED ORDER — LORAZEPAM 0.5 MG PO TABS
0.5000 mg | ORAL_TABLET | Freq: Four times a day (QID) | ORAL | Status: DC | PRN
  Administered 2022-05-30: 0.5 mg via ORAL
  Filled 2022-05-26: qty 1

## 2022-05-26 MED ORDER — AMLODIPINE BESYLATE 5 MG PO TABS
2.5000 mg | ORAL_TABLET | Freq: Every day | ORAL | Status: DC
  Administered 2022-05-26 – 2022-06-01 (×6): 2.5 mg via ORAL
  Filled 2022-05-26 (×6): qty 1

## 2022-05-26 MED ORDER — DEXAMETHASONE SODIUM PHOSPHATE 4 MG/ML IJ SOLN
INTRAMUSCULAR | Status: DC | PRN
  Administered 2022-05-26: 10 mg via PERINEURAL

## 2022-05-26 MED ORDER — ROPIVACAINE HCL 5 MG/ML IJ SOLN
INTRAMUSCULAR | Status: DC | PRN
  Administered 2022-05-26: 30 mL via PERINEURAL

## 2022-05-26 MED ORDER — TRAZODONE HCL 50 MG PO TABS
50.0000 mg | ORAL_TABLET | Freq: Every day | ORAL | Status: DC
  Administered 2022-05-26: 50 mg via ORAL
  Filled 2022-05-26: qty 1

## 2022-05-26 MED ORDER — FENTANYL CITRATE PF 50 MCG/ML IJ SOSY
25.0000 ug | PREFILLED_SYRINGE | Freq: Once | INTRAMUSCULAR | Status: AC
  Administered 2022-05-26: 50 ug via INTRAVENOUS
  Filled 2022-05-26: qty 2

## 2022-05-26 MED ORDER — ASPIRIN 81 MG PO CHEW
81.0000 mg | CHEWABLE_TABLET | Freq: Every day | ORAL | Status: DC
  Administered 2022-05-26 – 2022-06-01 (×6): 81 mg via ORAL
  Filled 2022-05-26 (×6): qty 1

## 2022-05-26 MED ORDER — MEMANTINE HCL 10 MG PO TABS
10.0000 mg | ORAL_TABLET | Freq: Two times a day (BID) | ORAL | Status: DC
  Administered 2022-05-26 – 2022-06-01 (×11): 10 mg via ORAL
  Filled 2022-05-26 (×11): qty 1

## 2022-05-26 NOTE — Consult Note (Signed)
Reason for Consult:Left hip fx Referring Physician: Murray Hodgkins Time called: 0730 Time at bedside: [place] is an 82 y.o. male.  HPI: Joe Murray was walking in the garage when he fell. His wife found him a bit later and he was c/o left hip pain. He was brought to the ED where x-rays showed a left hip fx and orthopedic surgery was consulted. He lives with his wife and though he should use a RW to ambulate he often forgets or refuses.  Past Medical History:  Diagnosis Date   Alzheimer's dementia    sees Dr. Ellouise Newer   CAD S/P percutaneous coronary angioplasty 10/'03; 3/'09; 7/*09; 1/'17    a. Dr. Ellyn Hack; '03 - Cypher DES 3.0 mm 33 mm proximal-mid LAD (details D1 with ostial 60%); 3/'09 dRCA 2.75 mm x 13 mm Cypher DES (2.8 mm); 7/'09 pRCA 3.0 mm x 18 m Cypher DES (3.25 mm);; (2009) 2D Echo - EF 55%, (November 2014) nonischemic Myoview;; b. Promus DES to RCA and mid LAD 02/26/2015   Chronotropic incompetence 12/2011   ETT 11/2017: Normal blood pressure response.  No EKG changes.  Exercise stopped due to fatigue and dyspnea.  Heart rate increased to low 90s from 50s.  Suggest chronotropic incompetence.  Severely impaired exercise capacity. -->  Results reviewed with electrophysiology: Still not indication for pacemaker.   Diabetes mellitus type 2 in obese    CAD   Diverticulosis    Dyslipidemia, goal LDL below 70    on [employer] study medicaion.   ED (erectile dysfunction)    GERD (gastroesophageal reflux disease)    Hemorrhoids 07/2002   Internal and External   Insomnia    Nodular basal cell carcinoma (BCC) 05/08/2019   left ear ([id number])   Non-STEMI (non-ST elevated myocardial infarction) 11/2001   a. Proximal LAD tandem lesions -- long DES stent covering both;; b. Jan 2017: mRCA PCI, mLAD PCI   Parkinson's disease    sees Dr. Ellouise Newer   Persistent sinus bradycardia    Personal history of colonic adenomas 09/06/2012   Psoriasis    Resting HR in 54s   SCC  (squamous cell carcinoma) 11/13/2001   right upper outer arm (txpbx)   SCC (squamous cell carcinoma) 11/13/2009   right upper ear rim (cx3 36fu)   SCC (squamous cell carcinoma) 01/20/2010   right nose (cx76fu)   SCC (squamous cell carcinoma) 03/28/2014   right side of nose ( cx47fu)   SCC (squamous cell carcinoma) 06/03/2016   right side of nose   SCC (squamous cell carcinoma) 05/08/2019   right ear inferior (cx71fu) cis   SCCA (squamous cell carcinoma) of skin 04/23/2021   Right Temporal Scalp (well diff) (tx p bx)   Spasm of esophagus    Squamous cell carcinoma in situ (SCCIS) 05/08/2019   Right Ear Inferior   Squamous cell carcinoma of skin 11/08/2001   top right rim ear    SVT (supraventricular tachycardia) 2003   Vertigo, benign positional     Past Surgical History:  Procedure Laterality Date   CARDIAC CATHETERIZATION N/A 02/26/2015   Procedure: Left Heart Cath and Coronary Angiography;  Surgeon: Leonie Man, MD: mRCA 99% --> PCI. mLAD 75%-> FFR Guided PCI, Mod ISR in pLAD & pRCA   CARDIAC CATHETERIZATION N/A 02/26/2015   Procedure: Coronary Stent Intervention;  Surgeon: Leonie Man, MD;  Location: [facility]: mRCA Promus Premier DES 3.0 x 12 (3.5), mLAD Promus Premier DES 2.75 x 20 (  3.0)   CARDIAC CATHETERIZATION N/A 02/26/2015   Procedure: Intravascular Pressure Wire/FFR Study;  Surgeon: Leonie Man, MD;  Location: [facility];  Service: Cardiovascular: LAD 75% - FFR + -> PCI   CATARACT EXTRACTION  [id number]   COLONOSCOPY  12/16/2017   per Dr. Carlean Purl, adenomatous polyps, no repeats due to age    [place]  LAD - 2003, RCA 3 & 7/ '09   Cypher 3.0 x 32 mid LAD; Cyper 2.75 x 13 - distal RCA, 3.0 x 18 Prox RCA   ESOPHAGOGASTRODUODENOSCOPY  08-11-02   esophageal dilation per Dr. Carlean Purl   HERNIA REPAIR     right inguinal    NM MYOVIEW LTD  Nov 2014   ~8 METS; EF 60%, no ischemia or infarction   PERCUTANEOUS CORONARY STENT  INTERVENTION (PCI-S)  11/23/2001   NSTEMI: Prox-Mid LAD tandem ~80% lesions on either side of D1 (with 80% lesion) -- Cypher DES 3.0 mm x 33 m (covering both lesions)    PERCUTANEOUS CORONARY STENT INTERVENTION (PCI-S)  04/27/2007   Unstable Angina: Distal RCA 95%: Cypher 2.75x13  (2.8 mm); residual focal ~60%ISR in LAD stent, 60% D1 ostial (no PTCA on LAD due to no ischemia on ST)   PERCUTANEOUS CORONARY STENT INTERVENTION (PCI-S)  09/21/2007   Bradycardia & Unstable Angina: Prox RCA 70% - PCI Cypher DES 3.0 mm x 18 mm  (3.25 mm); ostial 60-70% jailed D1. LAD & distal RCA stents patent   SHOULDER SURGERY     left rotator cuff, Dr. Gladstone Lighter   TRANSTHORACIC ECHOCARDIOGRAM  March 2009   Normal LV size and function, EF 55%. Mild MR; mild RV dilation.   TRANSTHORACIC ECHOCARDIOGRAM  02/2015   EF 60-65%. Moderate concentric LVH. Normal function with normal regional wall motion. GR 1 DD   WRIST FRACTURE SURGERY     right    Family History  Problem Relation Age of Onset   Diabetes Mother    Hearing loss Father    Dementia Father    Heart attack Father 35       first MI prior to age 33   Heart attack Brother     Social History:  reports that he has been smoking cigars. He started smoking about 69 years ago. He has never used smokeless tobacco. He reports current alcohol use. He reports that he does not use drugs.  Allergies:  Allergies  Allergen Reactions   Beta Adrenergic Blockers Other (See Comments)    chronotropic incompetence   Praluent [Alirocumab]     Diffuse Rash - improved when stopping & taking Benadryl   Sulfonamide Derivatives     unknown   Statins Other (See Comments)    REACTION: myalgias    Medications: I have reviewed the patient's current medications.  Results for orders placed or performed during the hospital encounter of 05/25/22 (from the past 48 hour(s))  CBC with Differential/Platelet     Status: Abnormal   Collection Time: 05/25/22  6:27 PM  Result Value Ref  Range   WBC 14.3 (H) 4.0 - 10.5 K/uL   RBC 4.75 4.22 - 5.81 MIL/uL   Hemoglobin 15.1 13.0 - 17.0 g/dL   HCT 45.4 39.0 - 52.0 %   MCV 95.6 80.0 - 100.0 fL   MCH 31.8 26.0 - 34.0 pg   MCHC 33.3 30.0 - 36.0 g/dL   RDW 12.6 11.5 - 15.5 %   Platelets 185 150 - 400 K/uL   nRBC 0.0 0.0 -  0.2 %   Neutrophils Relative % 86 %   Neutro Abs 12.4 (H) 1.7 - 7.7 K/uL   Lymphocytes Relative 8 %   Lymphs Abs 1.1 0.7 - 4.0 K/uL   Monocytes Relative 5 %   Monocytes Absolute 0.7 0.1 - 1.0 K/uL   Eosinophils Relative 0 %   Eosinophils Absolute 0.0 0.0 - 0.5 K/uL   Basophils Relative 0 %   Basophils Absolute 0.0 0.0 - 0.1 K/uL   Immature Granulocytes 1 %   Abs Immature Granulocytes 0.08 (H) 0.00 - 0.07 K/uL    Comment: Performed at [facility] [place] [address] [place], [place] [address]  Urinalysis, Routine w reflex microscopic -Urine, Clean Catch     Status: Abnormal   Collection Time: 05/25/22  6:27 PM  Result Value Ref Range   Color, Urine YELLOW YELLOW   APPearance CLEAR CLEAR   Specific Gravity, Urine 1.017 1.005 - 1.030   pH 5.0 5.0 - 8.0   Glucose, UA 150 (A) NEGATIVE mg/dL   Hgb urine dipstick MODERATE (A) NEGATIVE   Bilirubin Urine NEGATIVE NEGATIVE   Ketones, ur NEGATIVE NEGATIVE mg/dL   Protein, ur 30 (A) NEGATIVE mg/dL   Nitrite NEGATIVE NEGATIVE   Leukocytes,Ua NEGATIVE NEGATIVE   RBC / HPF 0-5 0 - 5 RBC/hpf   WBC, UA 0-5 0 - 5 WBC/hpf   Bacteria, UA NONE SEEN NONE SEEN   Squamous Epithelial / HPF 0-5 0 - 5 /HPF    Comment: Performed at [facility] [place] [facility], [place] [address], [place], [place] [address]  Comprehensive metabolic panel     Status: Abnormal   Collection Time: 05/25/22  6:27 PM  Result Value Ref Range   Sodium 139 135 - 145 mmol/L   Potassium 4.0 3.5 - 5.1 mmol/L   Chloride 105 98 - 111 mmol/L   CO2 22 22 - 32 mmol/L   Glucose, Bld 179 (H) 70 - 99 mg/dL    Comment: Glucose reference range applies only to samples taken after fasting  for at least 8 hours.   BUN 22 8 - 23 mg/dL   Creatinine, Ser 1.35 (H) 0.61 - 1.24 mg/dL   Calcium 9.1 8.9 - 10.3 mg/dL   Total Protein 7.4 6.5 - 8.1 g/dL   Albumin 4.3 3.5 - 5.0 g/dL   AST 26 15 - 41 U/L   ALT 20 0 - 44 U/L   Alkaline Phosphatase 62 38 - 126 U/L   Total Bilirubin 0.5 0.3 - 1.2 mg/dL   GFR, Estimated 52 (L) >60 mL/min    Comment: (NOTE) Calculated using the CKD-EPI Creatinine Equation (2021)    Anion gap 12 5 - 15    Comment: Performed at [facility], [place] [address], [place], [place] [address]  ABO/Rh     Status: None   Collection Time: 05/25/22  6:27 PM  Result Value Ref Range   ABO/RH(D)      A POS Performed at [facility], [place] [address] [place], [place] [address]   Glucose, capillary     Status: Abnormal   Collection Time: 05/26/22 12:09 AM  Result Value Ref Range   Glucose-Capillary 262 (H) 70 - 99 mg/dL    Comment: Glucose reference range applies only to samples taken after fasting for at least 8 hours.  Basic metabolic panel     Status: Abnormal   Collection Time: 05/26/22  1:47 AM  Result Value Ref Range   Sodium 136 135 -  145 mmol/L   Potassium 3.6 3.5 - 5.1 mmol/L   Chloride 104 98 - 111 mmol/L   CO2 23 22 - 32 mmol/L   Glucose, Bld 209 (H) 70 - 99 mg/dL    Comment: Glucose reference range applies only to samples taken after fasting for at least 8 hours.   BUN 20 8 - 23 mg/dL   Creatinine, Ser 1.24 0.61 - 1.24 mg/dL   Calcium 8.8 (L) 8.9 - 10.3 mg/dL   GFR, Estimated 58 (L) >60 mL/min    Comment: (NOTE) Calculated using the CKD-EPI Creatinine Equation (2021)    Anion gap 9 5 - 15    Comment: Performed at [facility], [place] [address] [place], [place] [address]  CBC     Status: Abnormal   Collection Time: 05/26/22  1:47 AM  Result Value Ref Range   WBC 12.5 (H) 4.0 - 10.5 K/uL   RBC 4.21 (L) 4.22 - 5.81 MIL/uL   Hemoglobin 13.4 13.0 - 17.0 g/dL   HCT 40.0 39.0 - 52.0 %   MCV 95.0  80.0 - 100.0 fL   MCH 31.8 26.0 - 34.0 pg   MCHC 33.5 30.0 - 36.0 g/dL   RDW 12.6 11.5 - 15.5 %   Platelets 163 150 - 400 K/uL   nRBC 0.0 0.0 - 0.2 %    Comment: Performed at [facility], [place] [address] [place], [place] [address]  Type and screen [place]     Status: None   Collection Time: 05/26/22  1:47 AM  Result Value Ref Range   ABO/RH(D) A POS    Antibody Screen NEG    Sample Expiration      05/29/2022,2359 Performed at [facility], [place] [address] [place], [place] [address]   Glucose, capillary     Status: Abnormal   Collection Time: 05/26/22  4:10 AM  Result Value Ref Range   Glucose-Capillary 174 (H) 70 - 99 mg/dL    Comment: Glucose reference range applies only to samples taken after fasting for at least 8 hours.  Glucose, capillary     Status: Abnormal   Collection Time: 05/26/22  7:27 AM  Result Value Ref Range   Glucose-Capillary 164 (H) 70 - 99 mg/dL    Comment: Glucose reference range applies only to samples taken after fasting for at least 8 hours.  Glucose, capillary     Status: Abnormal   Collection Time: 05/26/22 11:39 AM  Result Value Ref Range   Glucose-Capillary 189 (H) 70 - 99 mg/dL    Comment: Glucose reference range applies only to samples taken after fasting for at least 8 hours.    CT Hip Right Wo Contrast  Addendum Date: 05/25/2022   ADDENDUM REPORT: 05/25/2022 22:45 ADDENDUM: Images of the left hip are now made available for evaluation. An impacted subcapital left femoral neck fracture is present with fracture fragments in near anatomic alignment. No dislocation. Mild left hip degenerative arthritis. Diffuse osteopenia. Visualized left hemipelvis is intact. Electronically Signed   By: Fidela Salisbury M.D.   On: 05/25/2022 22:45   Result Date: 05/25/2022 CLINICAL DATA:  Hip trauma, fracture suspected. Witnessed fall with right hip pain. EXAM: CT OF THE RIGHT HIP WITHOUT CONTRAST TECHNIQUE:  Multidetector CT imaging of the right hip was performed according to the standard protocol. Multiplanar CT image reconstructions were also generated. RADIATION DOSE REDUCTION: This exam was performed according to the departmental dose-optimization program which includes automated exposure  control, adjustment of the mA and/or kV according to patient size and/or use of iterative reconstruction technique. COMPARISON:  None Available. FINDINGS: Bones/Joint/Cartilage Osseous structures are diffusely osteopenic. No acute fracture or dislocation. Mild right hip degenerative arthritis. Degenerative changes are noted at the lumbosacral junction. No lytic or blastic bone lesion. Ligaments Suboptimally assessed by CT. Muscles and Tendons Unremarkable Soft tissues Sigmoid colonic diverticulosis. Moderate iliac atherosclerotic calcification. Small right fat containing inguinal hernia. IMPRESSION: 1. No acute fracture or dislocation of the right hip. Please note, prior plain film examination demonstrating a subcapital femoral neck fracture was of the LEFT hip and the given history reported LEFT hip pain. 2. Mild right hip degenerative arthritis. Electronically Signed: By: Fidela Salisbury M.D. On: 05/25/2022 22:06   DG Knee Complete 4 Views Left  Result Date: 05/25/2022 CLINICAL DATA:  Unwitnessed fall at 1600 hours today. EXAM: LEFT KNEE - COMPLETE 4+ VIEW COMPARISON:  None Available. FINDINGS: Mildly decreased bone mineralization. Moderate-to-large superior patellar degenerative osteophyte. Tiny inferior patellar degenerative osteophyte. Likely at least moderate patellofemoral joint space narrowing. Mild chronic enthesopathic change at the quadriceps insertion on the patella. No joint effusion.  No acute fracture or dislocation. Mild-to-moderate atherosclerotic calcifications. IMPRESSION: 1. No acute fracture. 2. Moderate patellofemoral osteoarthritis. Electronically Signed   By: Yvonne Kendall M.D.   On: 05/25/2022 19:09    DG Hip Unilat W or Wo Pelvis 2-3 Views Left  Result Date: 05/25/2022 CLINICAL DATA:  Unwitnessed fall today at 1600 hours. Left hip and left knee pain. EXAM: DG HIP (WITH OR WITHOUT PELVIS) 2-3V LEFT COMPARISON:  CT abdomen and pelvis 05/05/2016 FINDINGS: There is an acute fracture of the proximal left femoral neck with mild approximate 3-4 mm inferior and anterior displacement of the distal fracture component with respect to the proximal fracture component. Mild bilateral superomedial femoroacetabular joint space narrowing. Moderate bilateral superolateral acetabular degenerative osteophytosis. The pubic symphysis joint space is maintained. No acute fracture or dislocation. A vascular phlebolith overlies the left hemipelvis. IMPRESSION: Acute mildly displaced fracture of the proximal left femoral neck. Electronically Signed   By: Yvonne Kendall M.D.   On: 05/25/2022 19:06   DG Chest 2 View  Result Date: 05/25/2022 CLINICAL DATA:  Unwitnessed fall at 1600 hours today. EXAM: CHEST - 2 VIEW COMPARISON:  Chest radiographs 01/10/2018 and 02/25/2015 FINDINGS: Cardiac silhouette is at the upper limits of normal size for AP technique. Mildly decreased lung volumes. No focal airspace opacity. No pleural effusion or pneumothorax. Mild-to-moderate multilevel degenerative disc changes of the thoracic spine. IMPRESSION: Mildly decreased lung volumes. No acute cardiopulmonary process. Electronically Signed   By: Yvonne Kendall M.D.   On: 05/25/2022 19:04   CT Head Wo Contrast  Result Date: 05/25/2022 CLINICAL DATA:  Trauma unwitnessed fall EXAM: CT HEAD WITHOUT CONTRAST CT CERVICAL SPINE WITHOUT CONTRAST TECHNIQUE: Multidetector CT imaging of the head and cervical spine was performed following the standard protocol without intravenous contrast. Multiplanar CT image reconstructions of the cervical spine were also generated. RADIATION DOSE REDUCTION: This exam was performed according to the departmental dose-optimization  program which includes automated exposure control, adjustment of the mA and/or kV according to patient size and/or use of iterative reconstruction technique. COMPARISON:  CT brain and cervical spine 11/18/2019 FINDINGS: CT HEAD FINDINGS Brain: No acute territorial infarction, hemorrhage or intracranial mass. Mild atrophy. Mild white matter hypodensity consistent with chronic small vessel ischemic change. Stable ventricle size. Vascular: No hyperdense vessels.  Carotid vascular calcification Skull: Normal. Negative for fracture or focal  lesion. Sinuses/Orbits: No acute finding. Other: None CT CERVICAL SPINE FINDINGS Alignment: No subluxation.  Facet alignment within normal limits Skull base and vertebrae: No acute fracture. No primary bone lesion or focal pathologic process. Soft tissues and spinal canal: No prevertebral fluid or swelling. No visible canal hematoma. Disc levels: Moderate degenerative changes at C2-C3, C5-C6, C6-C7 and C7-T1. Facet degenerative changes at multiple levels with foraminal narrowing, worse at C5-C6. Upper chest: Negative. Other: Emphysema at the apices IMPRESSION: No CT evidence for acute intracranial abnormality. Atrophy and chronic small vessel ischemic changes of the white matter. Degenerative changes of the cervical spine without acute osseous abnormality. Emphysema. Emphysema (ICD10-J43.9). Electronically Signed   By: Donavan Foil M.D.   On: 05/25/2022 18:20   CT Cervical Spine Wo Contrast  Result Date: 05/25/2022 CLINICAL DATA:  Trauma unwitnessed fall EXAM: CT HEAD WITHOUT CONTRAST CT CERVICAL SPINE WITHOUT CONTRAST TECHNIQUE: Multidetector CT imaging of the head and cervical spine was performed following the standard protocol without intravenous contrast. Multiplanar CT image reconstructions of the cervical spine were also generated. RADIATION DOSE REDUCTION: This exam was performed according to the departmental dose-optimization program which includes automated exposure  control, adjustment of the mA and/or kV according to patient size and/or use of iterative reconstruction technique. COMPARISON:  CT brain and cervical spine 11/18/2019 FINDINGS: CT HEAD FINDINGS Brain: No acute territorial infarction, hemorrhage or intracranial mass. Mild atrophy. Mild white matter hypodensity consistent with chronic small vessel ischemic change. Stable ventricle size. Vascular: No hyperdense vessels.  Carotid vascular calcification Skull: Normal. Negative for fracture or focal lesion. Sinuses/Orbits: No acute finding. Other: None CT CERVICAL SPINE FINDINGS Alignment: No subluxation.  Facet alignment within normal limits Skull base and vertebrae: No acute fracture. No primary bone lesion or focal pathologic process. Soft tissues and spinal canal: No prevertebral fluid or swelling. No visible canal hematoma. Disc levels: Moderate degenerative changes at C2-C3, C5-C6, C6-C7 and C7-T1. Facet degenerative changes at multiple levels with foraminal narrowing, worse at C5-C6. Upper chest: Negative. Other: Emphysema at the apices IMPRESSION: No CT evidence for acute intracranial abnormality. Atrophy and chronic small vessel ischemic changes of the white matter. Degenerative changes of the cervical spine without acute osseous abnormality. Emphysema. Emphysema (ICD10-J43.9). Electronically Signed   By: Donavan Foil M.D.   On: 05/25/2022 18:20    Review of Systems  Unable to perform ROS: Dementia  Musculoskeletal:  Positive for arthralgias (Left hip, elbow, knee).   Blood pressure 132/65, pulse (!) 55, temperature 98.5 F (36.9 C), resp. rate 14, SpO2 97 %. Physical Exam Constitutional:      General: He is not in acute distress.    Appearance: He is well-developed. He is not diaphoretic.  HENT:     Head: Normocephalic and atraumatic.  Eyes:     General: No scleral icterus.       Right eye: No discharge.        Left eye: No discharge.     Conjunctiva/sclera: Conjunctivae normal.   Cardiovascular:     Rate and Rhythm: Normal rate and regular rhythm.  Pulmonary:     Effort: Pulmonary effort is normal. No respiratory distress.  Musculoskeletal:     Cervical back: Normal range of motion.     Comments: Left shoulder, elbow, wrist, digits- no skin wounds, mild TTP lateral elbow, no instability, no blocks to motion  Sens  Ax/R/M/U grossly intact  Mot   Ax/ R/ PIN/ M/ AIN/ U grossly intact  Rad 2+  LLE No traumatic  wounds or rash, ecchymosis knee  MIld TTP knee  No knee or ankle effusion  Knee stable to varus/ valgus and anterior/posterior stress  Sens DPN, SPN, TN grossly intact  Motor EHL, ext, flex, evers grossly intact  DP 1+, PT 0, No significant edema  Skin:    General: Skin is warm and dry.  Neurological:     Mental Status: He is alert.  Psychiatric:        Mood and Affect: Mood normal.        Behavior: Behavior normal.     Assessment/Plan: Left hip fx -- Plan hip hemi tomorrow with Dr. Zachery Dakins. Please keep NPO after MN. Left elbow pain -- Will check x-rays    Lisette Abu, PA-C Orthopedic Surgery [phone] 05/26/2022, 11:45 AM

## 2022-05-26 NOTE — Hospital Course (Addendum)
82 year old man PMH including Parkinson's disease, dementia, presented after a fall at home resulting in a left femoral neck fracture.

## 2022-05-26 NOTE — Anesthesia Preprocedure Evaluation (Signed)
Anesthesia Evaluation  Patient identified by MRN, date of birth, ID band Patient awake    Reviewed: Allergy & Precautions, H&P , Patient's Chart, lab work & pertinent test results  Airway Mallampati: II  TM Distance: >3 FB Neck ROM: Full    Dental no notable dental hx.    Pulmonary neg pulmonary ROS, Current Smoker   Pulmonary exam normal breath sounds clear to auscultation       Cardiovascular hypertension, Pt. on medications + CAD, + Past MI (NSTEMI 2003) and + Cardiac Stents (2017)  Normal cardiovascular exam+ dysrhythmias (SVT 2003) Supra Ventricular Tachycardia  Rhythm:Regular Rate:Normal     Neuro/Psych  PSYCHIATRIC DISORDERS      Alzheimers, parkinsons negative neurological ROS     GI/Hepatic Neg liver ROS,GERD  ,,  Endo/Other  diabetes, Well Controlled, Type 2, Oral Hypoglycemic Agents    Renal/GU negative Renal ROS  negative genitourinary   Musculoskeletal negative musculoskeletal ROS (+)    Abdominal   Peds negative pediatric ROS (+)  Hematology negative hematology ROS (+) Hb 13.4, plt 163   Anesthesia Other Findings   Reproductive/Obstetrics negative OB ROS                             Anesthesia Physical Anesthesia Plan  ASA: 3  Anesthesia Plan: Regional   Post-op Pain Management:    Induction:   PONV Risk Score and Plan:   Airway Management Planned: Natural Airway and Nasal Cannula  Additional Equipment: None  Intra-op Plan:   Post-operative Plan:   Informed Consent: I have reviewed the patients History and Physical, chart, labs and discussed the procedure including the risks, benefits and alternatives for the proposed anesthesia with the patient or authorized representative who has indicated his/her understanding and acceptance.     Consent reviewed with POA  Plan Discussed with: CRNA  Anesthesia Plan Comments:        Anesthesia Quick Evaluation

## 2022-05-26 NOTE — Plan of Care (Signed)
  Problem: Nutritional: Goal: Maintenance of adequate nutrition will improve Outcome: Progressing Goal: Progress toward achieving an optimal weight will improve Outcome: Progressing   Problem: Activity: Goal: Risk for activity intolerance will decrease Outcome: Progressing   Problem: Pain Managment: Goal: General experience of comfort will improve Outcome: Progressing   

## 2022-05-26 NOTE — Progress Notes (Signed)
  Progress Note   Patient: Joe Murray [id number] DOB: 08-Jan-1941 DOA: 05/25/2022     1 DOS: the patient was seen and examined on 05/26/2022   Brief hospital course: 82 year old man PMH including Parkinson's disease, dementia, presented after a fall at home resulting in a left femoral neck fracture.  Assessment and Plan: Acute fracture of the left femoral neck: Occurring after unwitnessed fall.   Plan for operative intervention per orthopedics tomorrow.   CKD stage IIIa Creatinine stable.   Type 2 diabetes CBG stable.  Hold glipizide. Sliding scale insulin.   CAD Appears to be stable.  Follows with cardiology, Dr. Ellyn Hack.  Most recent PCI in January 2017 with DES to RCA and mid LAD. Can resume aspirin. Not on beta-blocker due to history of bradycardia On Repatha as an outpatient, not on statin due to myalgias   Essential hypertension: Continue amlodipine.   Parkinson's disease Parkinson's dementia with hallucinations, paranoia, agitation, disturbed wake/sleep cycle Continue Sinemet, memantine Trazodone for sleep.  Ativan for anxiety.  Could consider Seroquel.  Discussed with daughter she will consider.     Subjective:  Confused Per daughter pt has Parkinson's dementia, paranoia (thinks she is trying to steal his money) agitation (not violent), worsening significantly last 6 months; sleeps days, stays up at night, watches TV; can sometimes bathe and dress self; eating by self "is a mess". Retired Cabin [employer]  Physical Exam: Vitals:   05/26/22 1311 05/26/22 1531 05/26/22 1628 05/26/22 1744  BP: (!) 174/71 (!) 146/64  (!) 142/79  Pulse: (!) 53 (!) 52  (!) 56  Resp: 18 18    Temp: 98.1 F (36.7 C) 97.8 F (36.6 C)  98.2 F (36.8 C)  TempSrc: Oral Oral  Oral  SpO2: 97% 97%  99%  Weight:   98.5 kg   Height:   5\' 8"  (1.727 m)    Physical Exam Vitals reviewed.  Constitutional:      General: He is not in acute distress.    Appearance: He is not ill-appearing  or toxic-appearing.  Cardiovascular:     Rate and Rhythm: Normal rate and regular rhythm.     Heart sounds: No murmur heard. Pulmonary:     Effort: Pulmonary effort is normal. No respiratory distress.     Breath sounds: No wheezing, rhonchi or rales.  Neurological:     Mental Status: He is alert. He is disoriented.  Psychiatric:     Comments: Confused, restless, pulling at gown; oriented to self, daughter   Data Reviewed: CBG stable Creatinine 1.24 WBC down to 12.5  Family Communication: daughter at bedside  Disposition: Status is: Inpatient Remains inpatient appropriate because: hip fracture  Planned Discharge Destination: Skilled nursing facility    Time spent: 35 minutes  Author: Murray Hodgkins, MD 05/26/2022 6:57 PM  For on call review [email or web address].

## 2022-05-26 NOTE — Anesthesia Procedure Notes (Signed)
Anesthesia Regional Block: Peng block   Pre-Anesthetic Checklist: , timeout performed,  Correct Patient, Correct Site, Correct Laterality,  Correct Procedure, Correct Position, site marked,  Risks and benefits discussed,  Surgical consent,  Pre-op evaluation,  At surgeon's request and post-op pain management  Laterality: Left  Prep: Maximum Sterile Barrier Precautions used, chloraprep       Needles:  Injection technique: Single-shot  Needle Type: Echogenic Stimulator Needle     Needle Length: 9cm  Needle Gauge: 22     Additional Needles:   Procedures:,,,, ultrasound used (permanent image in chart),,    Narrative:  Start time: 05/26/2022 9:10 AM End time: 05/26/2022 9:20 AM Injection made incrementally with aspirations every 5 mL.  Performed by: Personally  Anesthesiologist: Pervis Hocking, DO  Additional Notes: Monitors applied. No increased pain on injection. No increased resistance to injection. Injection made in 5cc increments. Good needle visualization. Patient tolerated procedure well.

## 2022-05-26 NOTE — Progress Notes (Signed)
Initial Nutrition Assessment  INTERVENTION:   Once diet advanced: -Ensure Plus High Protein po BID, each supplement provides 350 kcal and 20 grams of protein.  -Multivitamin with minerals daily  NUTRITION DIAGNOSIS:   Increased nutrient needs related to post-op healing, hip fracture as evidenced by estimated needs.  GOAL:   Patient will meet greater than or equal to 90% of their needs  MONITOR:   PO intake, Supplement acceptance, Labs, Weight trends, I & O's  REASON FOR ASSESSMENT:   Consult Hip fracture protocol  ASSESSMENT:   82 y.o. male with medical history significant for CAD s/p PCI, Parkinson's disease, dementia, T2DM, HTN, CKD stage IIIa, sinus bradycardia who presented to the ED for evaluation of left hip pain after a fall.  History is limited from patient due to dementia and is otherwise supplemented by EDP, chart review, and spouse at bedside.  Attempted to see pt x2, unable to see patient at this time. Per chart review, pt with dementia. Had a fall at home, now with left femur fracture. Will be NPO tomorrow for surgery.  Will order Ensure supplements once diet is advanced following surgery.  Per weight records, no weight recorded for this admission. Last recorded weight: 216 lbs  on 2/12.  Medications: Lactated ringers  Labs reviewed: CBGs: 164-262   NUTRITION - FOCUSED PHYSICAL EXAM:  Unable to complete at this time.  Diet Order:   Diet Order             Diet NPO time specified  Diet effective midnight           Diet Carb Modified Fluid consistency: Thin; Room service appropriate? Yes  Diet effective now                   EDUCATION NEEDS:   No education needs have been identified at this time  Skin:  Skin Assessment: Reviewed RN Assessment  Last BM:  3/31  Height:   Ht Readings from Last 1 Encounters:  04/06/22 5\' 8"  (1.727 m)    Weight:   Wt Readings from Last 1 Encounters:  04/06/22 98.5 kg    BMI:  There is no height or  weight on file to calculate BMI.  Estimated Nutritional Needs:   Kcal:  [id number]  Protein:  80-90g  Fluid:  1.9L/day  Clayton Bibles, MS, RD, LDN Inpatient Clinical Dietitian Contact information available via Amion

## 2022-05-27 ENCOUNTER — Inpatient Hospital Stay (HOSPITAL_COMMUNITY): Payer: Medicare HMO

## 2022-05-27 ENCOUNTER — Encounter (HOSPITAL_COMMUNITY): Payer: Self-pay | Admitting: Internal Medicine

## 2022-05-27 ENCOUNTER — Other Ambulatory Visit: Payer: Self-pay

## 2022-05-27 ENCOUNTER — Encounter (HOSPITAL_COMMUNITY)
Admission: EM | Disposition: A | Discharge status: Skilled Nursing Facility | Payer: Self-pay | Source: Home / Self Care | Attending: Internal Medicine

## 2022-05-27 ENCOUNTER — Inpatient Hospital Stay (HOSPITAL_COMMUNITY): Payer: Medicare HMO | Admitting: Anesthesiology

## 2022-05-27 DIAGNOSIS — G20A2 Parkinson's disease without dyskinesia, with fluctuations: Secondary | ICD-10-CM | POA: Diagnosis not present

## 2022-05-27 DIAGNOSIS — S72002A Fracture of unspecified part of neck of left femur, initial encounter for closed fracture: Secondary | ICD-10-CM

## 2022-05-27 DIAGNOSIS — S72142A Displaced intertrochanteric fracture of left femur, initial encounter for closed fracture: Secondary | ICD-10-CM | POA: Diagnosis not present

## 2022-05-27 DIAGNOSIS — I2510 Atherosclerotic heart disease of native coronary artery without angina pectoris: Secondary | ICD-10-CM

## 2022-05-27 DIAGNOSIS — I252 Old myocardial infarction: Secondary | ICD-10-CM | POA: Diagnosis not present

## 2022-05-27 DIAGNOSIS — Z9861 Coronary angioplasty status: Secondary | ICD-10-CM

## 2022-05-27 DIAGNOSIS — F17210 Nicotine dependence, cigarettes, uncomplicated: Secondary | ICD-10-CM | POA: Diagnosis not present

## 2022-05-27 DIAGNOSIS — Z955 Presence of coronary angioplasty implant and graft: Secondary | ICD-10-CM

## 2022-05-27 DIAGNOSIS — N1831 Chronic kidney disease, stage 3a: Secondary | ICD-10-CM | POA: Diagnosis not present

## 2022-05-27 HISTORY — PX: INTRAMEDULLARY (IM) NAIL INTERTROCHANTERIC: SHX5875

## 2022-05-27 LAB — GLUCOSE, CAPILLARY
Glucose-Capillary: 128 mg/dL — ABNORMAL HIGH (ref 70–99)
Glucose-Capillary: 131 mg/dL — ABNORMAL HIGH (ref 70–99)
Glucose-Capillary: 137 mg/dL — ABNORMAL HIGH (ref 70–99)
Glucose-Capillary: 177 mg/dL — ABNORMAL HIGH (ref 70–99)
Glucose-Capillary: 185 mg/dL — ABNORMAL HIGH (ref 70–99)
Glucose-Capillary: 196 mg/dL — ABNORMAL HIGH (ref 70–99)
Glucose-Capillary: 213 mg/dL — ABNORMAL HIGH (ref 70–99)

## 2022-05-27 SURGERY — FIXATION, FRACTURE, INTERTROCHANTERIC, WITH INTRAMEDULLARY ROD
Anesthesia: General

## 2022-05-27 MED ORDER — ISOPROPYL ALCOHOL 70 % SOLN
Status: DC | PRN
  Administered 2022-05-27: 1 via TOPICAL

## 2022-05-27 MED ORDER — DEXAMETHASONE SODIUM PHOSPHATE 10 MG/ML IJ SOLN
INTRAMUSCULAR | Status: DC | PRN
  Administered 2022-05-27: 10 mg via INTRAVENOUS

## 2022-05-27 MED ORDER — OXYCODONE HCL 5 MG/5ML PO SOLN: 5.0000 mg | Freq: Once | ORAL | Status: DC | PRN

## 2022-05-27 MED ORDER — ROCURONIUM BROMIDE 10 MG/ML (PF) SYRINGE
PREFILLED_SYRINGE | INTRAVENOUS | Status: AC
  Filled 2022-05-27: qty 10

## 2022-05-27 MED ORDER — CHLORHEXIDINE GLUCONATE 4 % EX LIQD
60.0000 mL | Freq: Once | CUTANEOUS | Status: AC
  Administered 2022-05-27: 4 via TOPICAL
  Filled 2022-05-27: qty 60

## 2022-05-27 MED ORDER — LIDOCAINE HCL (PF) 2 % IJ SOLN
INTRAMUSCULAR | Status: AC
  Filled 2022-05-27: qty 5

## 2022-05-27 MED ORDER — CEFAZOLIN SODIUM-DEXTROSE 2-4 GM/100ML-% IV SOLN
2.0000 g | INTRAVENOUS | Status: AC
  Administered 2022-05-27: 2 g via INTRAVENOUS
  Filled 2022-05-27: qty 100

## 2022-05-27 MED ORDER — FENTANYL CITRATE PF 50 MCG/ML IJ SOSY: 25.0000 ug | PREFILLED_SYRINGE | INTRAMUSCULAR | Status: DC | PRN

## 2022-05-27 MED ORDER — POVIDONE-IODINE 10 % EX SWAB
2.0000 | Freq: Once | CUTANEOUS | Status: AC
  Administered 2022-05-27: 2 via TOPICAL

## 2022-05-27 MED ORDER — VANCOMYCIN HCL 1000 MG IV SOLR
INTRAVENOUS | Status: AC
  Filled 2022-05-27: qty 20

## 2022-05-27 MED ORDER — PROPOFOL 10 MG/ML IV BOLUS
INTRAVENOUS | Status: AC
  Filled 2022-05-27: qty 20

## 2022-05-27 MED ORDER — ENOXAPARIN SODIUM 40 MG/0.4ML IJ SOSY
40.0000 mg | PREFILLED_SYRINGE | INTRAMUSCULAR | Status: DC
  Administered 2022-05-28 – 2022-06-01 (×5): 40 mg via SUBCUTANEOUS
  Filled 2022-05-27 (×5): qty 0.4

## 2022-05-27 MED ORDER — PROPOFOL 10 MG/ML IV BOLUS
INTRAVENOUS | Status: DC | PRN
  Administered 2022-05-27: 100 mg via INTRAVENOUS

## 2022-05-27 MED ORDER — CEFAZOLIN SODIUM-DEXTROSE 2-4 GM/100ML-% IV SOLN
2.0000 g | Freq: Three times a day (TID) | INTRAVENOUS | Status: AC
  Administered 2022-05-27 – 2022-05-28 (×2): 2 g via INTRAVENOUS
  Filled 2022-05-27 (×2): qty 100

## 2022-05-27 MED ORDER — FENTANYL CITRATE (PF) 100 MCG/2ML IJ SOLN
INTRAMUSCULAR | Status: AC
  Filled 2022-05-27: qty 2

## 2022-05-27 MED ORDER — ROCURONIUM BROMIDE 10 MG/ML (PF) SYRINGE
PREFILLED_SYRINGE | INTRAVENOUS | Status: DC | PRN
  Administered 2022-05-27: 60 mg via INTRAVENOUS

## 2022-05-27 MED ORDER — PHENYLEPHRINE 80 MCG/ML (10ML) SYRINGE FOR IV PUSH (FOR BLOOD PRESSURE SUPPORT)
PREFILLED_SYRINGE | INTRAVENOUS | Status: DC | PRN
  Administered 2022-05-27: 120 ug via INTRAVENOUS

## 2022-05-27 MED ORDER — QUETIAPINE FUMARATE 50 MG PO TABS
50.0000 mg | ORAL_TABLET | Freq: Every day | ORAL | Status: DC
  Administered 2022-05-27 – 2022-05-31 (×5): 50 mg via ORAL
  Filled 2022-05-27 (×5): qty 1

## 2022-05-27 MED ORDER — TRANEXAMIC ACID-NACL 1000-0.7 MG/100ML-% IV SOLN
1000.0000 mg | INTRAVENOUS | Status: AC
  Administered 2022-05-27: 1000 mg via INTRAVENOUS
  Filled 2022-05-27: qty 100

## 2022-05-27 MED ORDER — SUGAMMADEX SODIUM 200 MG/2ML IV SOLN
INTRAVENOUS | Status: DC | PRN
  Administered 2022-05-27: 200 mg via INTRAVENOUS

## 2022-05-27 MED ORDER — EPHEDRINE SULFATE-NACL 50-0.9 MG/10ML-% IV SOSY
PREFILLED_SYRINGE | INTRAVENOUS | Status: DC | PRN
  Administered 2022-05-27: 5 mg via INTRAVENOUS
  Administered 2022-05-27: 10 mg via INTRAVENOUS

## 2022-05-27 MED ORDER — PROPOFOL 10 MG/ML IV BOLUS: INTRAVENOUS | Status: DC | PRN

## 2022-05-27 MED ORDER — ONDANSETRON HCL 4 MG/2ML IJ SOLN: 4.0000 mg | Freq: Once | INTRAMUSCULAR | Status: DC | PRN

## 2022-05-27 MED ORDER — ACETAMINOPHEN 10 MG/ML IV SOLN: 1000.0000 mg | Freq: Once | INTRAVENOUS | Status: DC | PRN

## 2022-05-27 MED ORDER — OXYCODONE HCL 5 MG PO TABS: 5.0000 mg | ORAL_TABLET | Freq: Once | ORAL | Status: DC | PRN

## 2022-05-27 MED ORDER — 0.9 % SODIUM CHLORIDE (POUR BTL) OPTIME
TOPICAL | Status: DC | PRN
  Administered 2022-05-27: 1000 mL

## 2022-05-27 MED ORDER — FENTANYL CITRATE (PF) 100 MCG/2ML IJ SOLN
INTRAMUSCULAR | Status: DC | PRN
  Administered 2022-05-27 (×2): 50 ug via INTRAVENOUS

## 2022-05-27 MED ORDER — VANCOMYCIN HCL 1000 MG IV SOLR
INTRAVENOUS | Status: DC | PRN
  Administered 2022-05-27: 1000 mg via TOPICAL

## 2022-05-27 MED ORDER — LIDOCAINE 2% (20 MG/ML) 5 ML SYRINGE
INTRAMUSCULAR | Status: DC | PRN
  Administered 2022-05-27: 80 mg via INTRAVENOUS

## 2022-05-27 MED ORDER — DEXMEDETOMIDINE HCL IN NACL 80 MCG/20ML IV SOLN
INTRAVENOUS | Status: AC
  Filled 2022-05-27: qty 20

## 2022-05-27 SURGICAL SUPPLY — 102 items
ADH SKN CLS APL DERMABOND .7 (GAUZE/BANDAGES/DRESSINGS) ×2
APL PRP STRL LF DISP 70% ISPRP (MISCELLANEOUS) ×4
BAG COUNTER SPONGE SURGICOUNT (BAG) ×2 IMPLANT
BAG DECANTER FOR FLEXI CONT (MISCELLANEOUS) ×3 IMPLANT
BAG SPEC THK2 15X12 ZIP CLS (MISCELLANEOUS) ×2
BAG SPNG CNTER NS LX DISP (BAG)
BAG ZIPLOCK 12X15 (MISCELLANEOUS) ×3 IMPLANT
BIT DRILL 4.8X300 (BIT) ×1 IMPLANT
BLADE SAW SAG 25X90X1.19 (BLADE) IMPLANT
BRUSH FEMORAL CANAL (MISCELLANEOUS) IMPLANT
CEMENT BONE SIMPLEX SPEEDSET (Cement) IMPLANT
CEMENT RESTRICTOR BONE PREP ST (Cement) IMPLANT
CHLORAPREP W/TINT 26 (MISCELLANEOUS) ×8 IMPLANT
COVER MAYO STAND STRL (DRAPES) IMPLANT
COVER PERINEAL POST (MISCELLANEOUS) ×3 IMPLANT
COVER SURGICAL LIGHT HANDLE (MISCELLANEOUS) ×6 IMPLANT
DERMABOND ADVANCED .7 DNX12 (GAUZE/BANDAGES/DRESSINGS) ×3 IMPLANT
DRAPE 3/4 80X56 (DRAPES) ×6 IMPLANT
DRAPE C-ARMOR (DRAPES) ×3 IMPLANT
DRAPE HALF SHEET 70X43 (DRAPES) ×6 IMPLANT
DRAPE HIP W/POCKET STRL (MISCELLANEOUS) ×3 IMPLANT
DRAPE INCISE IOBAN 66X45 STRL (DRAPES) ×3 IMPLANT
DRAPE INCISE IOBAN 85X60 (DRAPES) ×3 IMPLANT
DRAPE POUCH INSTRU U-SHP 10X18 (DRAPES) ×3 IMPLANT
DRAPE SHEET LG 3/4 BI-LAMINATE (DRAPES) ×3 IMPLANT
DRAPE STERI IOBAN 125X83 (DRAPES) ×3 IMPLANT
DRAPE SURG 17X11 SM STRL (DRAPES) IMPLANT
DRAPE U-SHAPE 47X51 STRL (DRAPES) ×12 IMPLANT
DRESSING AQUACEL AG SP 3.5X10 (GAUZE/BANDAGES/DRESSINGS) ×3 IMPLANT
DRESSING MEPILEX FLEX 4X4 (GAUZE/BANDAGES/DRESSINGS) ×2 IMPLANT
DRSG AQUACEL AG ADV 3.5X 6 (GAUZE/BANDAGES/DRESSINGS) ×3 IMPLANT
DRSG AQUACEL AG SP 3.5X10 (GAUZE/BANDAGES/DRESSINGS) ×2
DRSG MEPILEX FLEX 4X4 (GAUZE/BANDAGES/DRESSINGS) ×4
DRSG TEGADERM 4X4.75 (GAUZE/BANDAGES/DRESSINGS) ×6 IMPLANT
ELECT BLADE TIP CTD 4 INCH (ELECTRODE) ×3 IMPLANT
ELECT REM PT RETURN 15FT ADLT (MISCELLANEOUS) ×3 IMPLANT
GAUZE SPONGE 4X4 12PLY STRL (GAUZE/BANDAGES/DRESSINGS) ×3 IMPLANT
GLOVE BIO SURGEON STRL SZ7.5 (GLOVE) ×6 IMPLANT
GLOVE BIO SURGEON STRL SZ8 (GLOVE) ×3 IMPLANT
GLOVE BIOGEL PI IND STRL 7.5 (GLOVE) ×3 IMPLANT
GLOVE BIOGEL PI IND STRL 8 (GLOVE) ×9 IMPLANT
GLOVE SURG LX STRL 8.0 MICRO (GLOVE) ×6 IMPLANT
GLOVE SURG ORTHO 8.0 STRL STRW (GLOVE) ×3 IMPLANT
GOWN STRL REUS W/ TWL LRG LVL3 (GOWN DISPOSABLE) ×3 IMPLANT
GOWN STRL REUS W/ TWL XL LVL3 (GOWN DISPOSABLE) ×9 IMPLANT
GOWN STRL REUS W/TWL LRG LVL3 (GOWN DISPOSABLE) ×2
GOWN STRL REUS W/TWL XL LVL3 (GOWN DISPOSABLE) ×6
HANDPIECE INTERPULSE COAX TIP (DISPOSABLE) ×2
HOLDER FOLEY CATH W/STRAP (MISCELLANEOUS) ×3 IMPLANT
HOOD PEEL AWAY T7 (MISCELLANEOUS) ×9 IMPLANT
IMMOBILIZER KNEE 20 (SOFTGOODS)
IMMOBILIZER KNEE 20 THIGH 36 (SOFTGOODS) IMPLANT
JET LAVAGE IRRISEPT WOUND (IRRIGATION / IRRIGATOR)
KIT BASIN OR (CUSTOM PROCEDURE TRAY) ×6 IMPLANT
KIT TURNOVER KIT A (KITS) IMPLANT
LAVAGE JET IRRISEPT WOUND (IRRIGATION / IRRIGATOR) IMPLANT
MANIFOLD NEPTUNE II (INSTRUMENTS) ×5 IMPLANT
MARKER SKIN DUAL TIP RULER LAB (MISCELLANEOUS) ×3 IMPLANT
NS IRRIG 1000ML POUR BTL (IV SOLUTION) ×5 IMPLANT
PACK GENERAL/GYN (CUSTOM PROCEDURE TRAY) ×3 IMPLANT
PACK TOTAL JOINT (CUSTOM PROCEDURE TRAY) ×3 IMPLANT
PIN GUIDE DRIL TIP 2.8X300 STE (PIN) ×3 IMPLANT
PIN GUIDE DRILL TIP 2.8X300 (DRILL) ×1 IMPLANT
PROTECTOR NERVE ULNAR (MISCELLANEOUS) ×3 IMPLANT
RETRACTOR YANK SUCT EIGR SABER (INSTRUMENTS) ×3 IMPLANT
RETRIEVER SUT HEWSON (MISCELLANEOUS) ×3 IMPLANT
SCREW CANN 8.0X100 HIP (Screw) ×1 IMPLANT
SCREW CANN FT 95X8 NS LNG (Screw) ×1 IMPLANT
SCREW CANNULATED 8.0X95 (Screw) ×2 IMPLANT
SCREW PARTIAL THREAD 8.0X90MM (Screw) ×2 IMPLANT
SEALER BIPOLAR AQUA 6.0 (INSTRUMENTS) ×3 IMPLANT
SET HNDPC FAN SPRY TIP SCT (DISPOSABLE) ×3 IMPLANT
SET INTERPULSE LAVAGE W/TIP (ORTHOPEDIC DISPOSABLE SUPPLIES) ×3 IMPLANT
SLEEVE STOCKINETTE LIMB 4X8 (MISCELLANEOUS) ×3 IMPLANT
SLEEVE STOCKINETTE LIMB 6X9 (MISCELLANEOUS) ×3 IMPLANT
SPIKE FLUID TRANSFER (MISCELLANEOUS) ×9 IMPLANT
STAPLER VISISTAT 35W (STAPLE) IMPLANT
STRIP CLOSURE SKIN 1/2X4 (GAUZE/BANDAGES/DRESSINGS) ×2 IMPLANT
SUCTION FRAZIER HANDLE 12FR (TUBING) ×2
SUCTION TUBE FRAZIER 12FR DISP (TUBING) ×3 IMPLANT
SUT BONE WAX W31G (SUTURE) ×3 IMPLANT
SUT ETHIBOND #5 BRAIDED 30INL (SUTURE) ×3 IMPLANT
SUT MNCRL AB 3-0 PS2 18 (SUTURE) ×5 IMPLANT
SUT STRATAFIX 0 PDS 27 VIOLET (SUTURE) ×2
SUT STRATAFIX 1PDS 45CM VIOLET (SUTURE) ×3 IMPLANT
SUT STRATAFIX PDO 1 14 VIOLET (SUTURE) ×2
SUT STRATFX PDO 1 14 VIOLET (SUTURE) ×2
SUT VIC AB 0 CT1 36 (SUTURE) ×3 IMPLANT
SUT VIC AB 1 CT1 36 (SUTURE) ×3 IMPLANT
SUT VIC AB 2-0 CT2 27 (SUTURE) ×10 IMPLANT
SUT VICRYL 0 27 CT2 27 ABS (SUTURE) ×6 IMPLANT
SUTURE STRATFX 0 PDS 27 VIOLET (SUTURE) ×3 IMPLANT
SUTURE STRATFX PDO 1 14 VIOLET (SUTURE) ×3 IMPLANT
SYR 20ML LL LF (SYRINGE) ×3 IMPLANT
TOWEL OR 17X26 10 PK STRL BLUE (TOWEL DISPOSABLE) ×6 IMPLANT
TOWEL OR NON WOVEN STRL DISP B (DISPOSABLE) ×3 IMPLANT
TOWER CARTRIDGE SMART MIX (DISPOSABLE) ×3 IMPLANT
TRAY FOLEY MTR SLVR 16FR STAT (SET/KITS/TRAYS/PACK) ×3 IMPLANT
TUBE KAMVAC SUCTION (TUBING) IMPLANT
TUBE SUCTION HIGH CAP CLEAR NV (SUCTIONS) ×1 IMPLANT
WASHER CANN FLAT 8 (Washer) ×2 IMPLANT
WATER STERILE IRR 1000ML POUR (IV SOLUTION) ×6 IMPLANT

## 2022-05-27 NOTE — Discharge Instructions (Signed)
Orthopedic Discharge Instructions  Diet: As you were doing prior to hospitalization   Shower:  May shower but keep the wounds dry, use an occlusive plastic wrap, NO SOAKING IN TUB.  If the bandage gets wet, change with a clean dry gauze.    Dressing:  You may change your dressing 3-5 days after surgery, unless you have a splint.  If the dressing remains clean and dry it can also be left on until follow up. If you change the dressing replace with clean gauze and tape or ace wrap.    Activity:  Increase activity slowly as tolerated, but follow the weight bearing instructions below.  The rules on driving is that you can not be taking narcotics while you drive, and you must feel in control of the vehicle.    Weight Bearing:   Weight bearing as tolerated.    Blood clot prevention (DVT Prophylaxis): After surgery you are at an increased risk for a blood clot. you were prescribed a blood thinner, lovenox 40mg , to be taken once daily for a total of 4 weeks from surgery to help reduce your risk of getting a blood clot. This will help prevent a blood clot. Signs of a pulmonary embolus (blood clot in the lungs) include sudden short of breath, feeling lightheaded or dizzy, chest pain with a deep breath, rapid pulse rapid breathing. Signs of a blood clot in your arms or legs include new unexplained swelling and cramping, warm, red or darkened skin around the painful area. Please call the office or 911 right away if these signs or symptoms develop. To prevent constipation: you may use a stool softener such as -  Colace (over the counter) 100 mg by mouth twice a day  Drink plenty of fluids (prune juice may be helpful) and high fiber foods Miralax (over the counter) for constipation as needed.    Itching:  If you experience itching with your medications, try taking only a single pain pill, or even half a pain pill at a time.  You may take up to 10 pain pills per day, and you can also use benadryl over the counter  for itching or also to help with sleep.   Precautions:  If you experience chest pain or shortness of breath - call 911 immediately for transfer to the hospital emergency department!!   Call office [phone]) for the following: Temperature greater than 101F Persistent nausea and vomiting Severe uncontrolled pain Redness, tenderness, or signs of infection (pain, swelling, redness, odor or green/yellow discharge around the site) Difficulty breathing, headache or visual disturbances Hives Persistent dizziness or light-headedness Extreme fatigue Any other questions or concerns you may have after discharge  In an emergency, call 911 or go to an Emergency Department at a nearby hospital  Follow- Up Appointment:  Please call for an appointment to be seen approximately 2-3 week after surgery in [facility] with your surgeon Dr. Charlies Constable - [phone] Address: [address] [place], [place], [place] [address]

## 2022-05-27 NOTE — Op Note (Signed)
05/27/2022  4:15 PM  PATIENT:  Joe Murray    PRE-OPERATIVE DIAGNOSIS: Left valgus impacted femoral neck and neck fracture  POST-OPERATIVE DIAGNOSIS:  Same  PROCEDURE:  Cannulated screw fixation of left valgus impacted femoral neck fracture  SURGEON:  Layth Cerezo A Bryndan Bilyk, MD  PHYSICIAN ASSISTANT: none  ANESTHESIA:   General  ESTIMATED BLOOD LOSS: 25cc.  PREOPERATIVE INDICATIONS:  Joe Murray is a  82 y.o. male who fell and was found to have a diagnosis of left valgus impacted femoral neck fracture who elected for surgical management.    The risks benefits and alternatives were discussed with the patient preoperatively including but not limited to the risks of infection, bleeding, nerve injury, cardiopulmonary complications, blood clots, malunion, nonunion, avascular necrosis, the need for revision surgery, the potential for conversion to hemiarthroplasty, among others, and the patient was willing to proceed.  OPERATIVE IMPLANTS: 8.58mm cannulated screws x3  OPERATIVE PROCEDURE: The patient was brought to the operating room and placed in supine position. IV antibiotics were given. General anesthesia administered. Foley was also given. The patient was placed on the fracture table. The operative extremity was positioned, without any significant reduction maneuver and was prepped and draped in usual sterile fashion.  Time out was performed.  Small incision was made distal to the greater trochanter, and 3 guidewires were introduced Into an inverted triangle configuration. The lengths were measured. The reduction was slightly valgus, and near-anatomic. I opened the cortex with a cannulated drill, and then placed the screws into position. Satisfactory fixation was achieved.  The wounds were irrigated copiously, and repaired with Vicryl with Steri-Strips and sterile gauze. There no complications and the patient tolerated the procedure well.  The patient will be weightbearing as  tolerated, and will be on Lovenox for a period of 4 weeks after discharge for DVT prophylaxis.  Post op recs: WB: WBAT  Abx: ancef x23 hours post op Imaging: PACU xrays Dressing: keep intact until follow up, change PRN if soiled or saturated. DVT prophylaxis: lovenox starting POD1 x4 weeks Follow up: 2 weeks after surgery for a wound check with Dr. Zachery Dakins at [facility].  Address: [address] [place], [place], [place] [address]  Office Phone: [phone]  Charlies Constable, MD

## 2022-05-27 NOTE — Transfer of Care (Signed)
Immediate Anesthesia Transfer of Care Note  Patient: Joe Murray  Procedure(s) Performed: Procedure(s): Cannulated screw fixation of left valgus impacted femoral neck fracture  Patient Location: PACU  Anesthesia Type:General  Level of Consciousness: Alert, Awake, Oriented  Airway & Oxygen Therapy: Patient Spontanous Breathing  Post-op Assessment: Report given to RN  Post vital signs: Reviewed and stable  Last Vitals:  Vitals:   05/27/22 1227 05/27/22 1549  BP: 110/79 (!) 164/85  Pulse: 64   Resp: 18 16  Temp:    SpO2: 0000000     Complications: No apparent anesthesia complications

## 2022-05-27 NOTE — Anesthesia Postprocedure Evaluation (Signed)
Anesthesia Post Note  Patient: Joe Murray  Procedure(s) Performed: Cannulated screw fixation of left valgus impacted femoral neck fracture     Patient location during evaluation: PACU Anesthesia Type: General Level of consciousness: awake and alert, oriented and patient cooperative Pain management: pain level controlled Vital Signs Assessment: post-procedure vital signs reviewed and stable Respiratory status: spontaneous breathing, nonlabored ventilation and respiratory function stable Cardiovascular status: blood pressure returned to baseline and stable Postop Assessment: no apparent nausea or vomiting Anesthetic complications: no   No notable events documented.  Last Vitals:  Vitals:   05/27/22 1755 05/27/22 1850  BP: (!) 156/113 (!) 150/98  Pulse: 71 82  Resp: 18 18  Temp: 36.6 C (!) 36.4 C  SpO2:  100%    Last Pain:  Vitals:   05/27/22 1850  TempSrc: Oral  PainSc:                  Pervis Hocking

## 2022-05-27 NOTE — Progress Notes (Signed)
Subjective: Patient lying in bed in restraints.  Confused at baseline.  Daughter at bedside.  Reports progressively worsening Alzheimer's over the past few months.  Worsening mobility. does have a walker but forgets to use it. he lives with his wife. Objective:   VITALS:   Vitals:   05/26/22 1628 05/26/22 1744 05/26/22 2011 05/27/22 0523  BP:  (!) 142/79 138/66 (!) 148/63  Pulse:  (!) 56 (!) 50 (!) 107  Resp:   18 18  Temp:  98.2 F (36.8 C) 98.3 F (36.8 C) 98.8 F (37.1 C)  TempSrc:  Oral Oral Oral  SpO2:  99% 100% 93%  Weight: 98.5 kg     Height: 5\' 8"  (1.727 m)       LLE No traumatic wounds, ecchymosis, or rash  Nontender  Minimal discomfort with log roll  No knee or ankle effusion  Knee stable to varus/ valgus stress  Sens DPN, SPN, TN intact  Motor EHL, ext, flex 5/5  DP 2+, PT 2+, No significant edema  RLE No traumatic wounds, ecchymosis, or rash  Nontender  No groin pain with log roll  No knee or ankle effusion  Knee stable to varus/ valgus stress  Sens DPN, SPN, TN intact  Motor EHL, ext, flex 5/5  DP 2+, PT 2+, No significant edema   Lab Results  Component Value Date   WBC 12.5 (H) 05/26/2022   HGB 13.4 05/26/2022   HCT 40.0 05/26/2022   MCV 95.0 05/26/2022   PLT 163 05/26/2022   BMET    Component Value Date/Time   NA 136 05/26/2022 0147   NA 142 06/01/2019 0824   K 3.6 05/26/2022 0147   CL 104 05/26/2022 0147   CO2 23 05/26/2022 0147   GLUCOSE 209 (H) 05/26/2022 0147   BUN 20 05/26/2022 0147   BUN 22 06/01/2019 0824   CREATININE 1.24 05/26/2022 0147   CREATININE 1.26 (H) 11/07/2019 1048   CALCIUM 8.8 (L) 05/26/2022 0147   GFRNONAA 58 (L) 05/26/2022 0147      Xray: X-rays and CT scan pelvis and left hip were reviewed demonstrates minimally displaced valgus impacted left femoral neck fracture  Assessment/Plan: Day of Surgery   Principal Problem:   Closed intertrochanteric fracture of left femur, initial encounter Active  Problems:   CAD S/P percutaneous coronary angioplasty - LADx2, & RCA x 3   DM (diabetes mellitus), type 2 with complications - CAD   Parkinson's disease   Chronic kidney disease, stage 3a  Valgus impacted stable left femoral neck fracture  Had extensive discussion with the patient's daughter at bedside discussed imaging findings showing a stable valgus impacted femoral neck fracture discussed treatment options including cannulated screw fixation versus hemiarthroplasty.  Discussed that the cannulated screw fixation is a much shorter surgery with a few risks.  Less blood loss low risk of nerve injury lower risk of infection given very small incision however discussed that possibility of nonunion occurs may benefit from hemiarthroplasty conversion in the future.  Discussed with hemiarthroplasty that this is a bigger surgery but does carry risk of dislocation and periprosthetic fracture infection nerve injury bleeding.  Given stable alignment of the fracture patient's daughter expresses understanding agreement plan to proceed with cannulated screw fixation.   Camila Norville A Maliea Grandmaison 05/27/2022, 9:59 AM   Charlies Constable, MD  Contact information:   [phone] 7am-5pm epic message Dr. Zachery Dakins, or call office for patient follow up: [phone] After hours and holidays please check [email or web address] for group  call information for [facility]

## 2022-05-27 NOTE — Progress Notes (Signed)
PROGRESS NOTE    Joe Murray  [id number] DOB: 08/17/1940 DOA: 05/25/2022 PCP: Laurey Morale, MD   Brief Narrative:  82 year old male with history of Parkinson disease, dementia presented after a fall at home resulting in a left femoral neck fracture.  Orthopedics was consulted.  Assessment & Plan:   Left femoral neck fracture after a fall -Orthopedics following: Planning for surgical intervention today.  Currently NPO.  Pain management. -Fall precautions  CKD stage IIIa -Creatinine stable.  No labs today  Leukocytosis -Possibly reactive.  No labs.  Diabetes mellitus type 2 with hyperglycemia -Continue CBGs with SSI.  Glipizide on hold.  Hypertension -Monitor blood pressure.  Continue amlodipine  Parkinson's disease Parkinson's dementia with hallucinations, paranoia, agitation, disturbed wake/sleep cycle -Continue Sinemet and memantine -Did not sleep at all last night as per the daughter with intermittent agitation.  Daughter agreeable to switch to Seroquel.  Start Seroquel from tonight. -Continue Ativan as needed for anxiety.  Currently has mittens -Consult palliative care for goals of care discussion.  Currently full code.  CAD status post stenting -Most recent PCI in January 2017 with DES to RCA and mid LAD. -Continue aspirin.  Not on beta-blocker due to history of bradycardia. -On Repatha as an outpatient.  Not on statin due to myalgias    DVT prophylaxis: SCDs for now Code Status: Full Family Communication: Daughter and wife at bedside Disposition Plan: Status is: Inpatient Remains inpatient appropriate because: Of severity of illness   Consultants: Orthopedics.  Consult palliative care  Procedures: None  Antimicrobials: None   Subjective: Patient seen and examined at bedside.  Awake but confused and has mittens.  Daughter present at bedside mentioned that patient could hardly sleep overnight.  No fever, vomiting reported.  Objective: Vitals:    05/26/22 1628 05/26/22 1744 05/26/22 2011 05/27/22 0523  BP:  (!) 142/79 138/66 (!) 148/63  Pulse:  (!) 56 (!) 50 (!) 107  Resp:   18 18  Temp:  98.2 F (36.8 C) 98.3 F (36.8 C) 98.8 F (37.1 C)  TempSrc:  Oral Oral Oral  SpO2:  99% 100% 93%  Weight: 98.5 kg     Height: 5\' 8"  (1.727 m)       Intake/Output Summary (Last 24 hours) at 05/27/2022 0950 Last data filed at 05/27/2022 0600 Gross per 24 hour  Intake 2428.18 ml  Output 2700 ml  Net -271.82 ml   Filed Weights   05/26/22 1628  Weight: 98.5 kg    Examination:  General exam: Appears calm and comfortable.  Looks chronically ill and deconditioned.  Elderly male lying in bed. Respiratory system: Bilateral decreased breath sounds at bases Cardiovascular system: S1 & S2 heard, mild intermittent bradycardia present gastrointestinal system: Abdomen is nondistended, soft and nontender. Normal bowel sounds heard. Extremities: No cyanosis, clubbing, edema  Central nervous system: Awake, slow to respond, confused.  No focal neurological deficits. Moving extremities.  Has mittens. Skin: No rashes, lesions or ulcers Psychiatry: Not agitated currently.  Affect is flat.  Data Reviewed: I have personally reviewed following labs and imaging studies  CBC: Recent Labs  Lab 05/25/22 1827 05/26/22 0147  WBC 14.3* 12.5*  NEUTROABS 12.4*  --   HGB 15.1 13.4  HCT 45.4 40.0  MCV 95.6 95.0  PLT 185 [id number]   Basic Metabolic Panel: Recent Labs  Lab 05/25/22 1827 05/26/22 0147  NA 139 136  K 4.0 3.6  CL 105 104  CO2 22 23  GLUCOSE 179* 209*  BUN 22  20  CREATININE 1.35* 1.24  CALCIUM 9.1 8.8*   GFR: Estimated Creatinine Clearance: 52.2 mL/min (by C-G formula based on SCr of 1.24 mg/dL). Liver Function Tests: Recent Labs  Lab 05/25/22 1827  AST 26  ALT 20  ALKPHOS 62  BILITOT 0.5  PROT 7.4  ALBUMIN 4.3   No results for input(s): "LIPASE", "AMYLASE" in the last 168 hours. No results for input(s): "AMMONIA" in the last  168 hours. Coagulation Profile: No results for input(s): "INR", "PROTIME" in the last 168 hours. Cardiac Enzymes: No results for input(s): "CKTOTAL", "CKMB", "CKMBINDEX", "TROPONINI" in the last 168 hours. BNP (last 3 results) No results for input(s): "PROBNP" in the last 8760 hours. HbA1C: No results for input(s): "HGBA1C" in the last 72 hours. CBG: Recent Labs  Lab 05/26/22 1527 05/26/22 2001 05/26/22 2347 05/27/22 0404 05/27/22 0723  GLUCAP 214* 230* 210* 177* 137*   Lipid Profile: No results for input(s): "CHOL", "HDL", "LDLCALC", "TRIG", "CHOLHDL", "LDLDIRECT" in the last 72 hours. Thyroid Function Tests: No results for input(s): "TSH", "T4TOTAL", "FREET4", "T3FREE", "THYROIDAB" in the last 72 hours. Anemia Panel: No results for input(s): "VITAMINB12", "FOLATE", "FERRITIN", "TIBC", "IRON", "RETICCTPCT" in the last 72 hours. Sepsis Labs: No results for input(s): "PROCALCITON", "LATICACIDVEN" in the last 168 hours.  Recent Results (from the past 240 hour(s))  Surgical PCR screen     Status: None   Collection Time: 05/26/22  2:22 PM   Specimen: Nasal Mucosa; Nasal Swab  Result Value Ref Range Status   MRSA, PCR NEGATIVE NEGATIVE Final   Staphylococcus aureus NEGATIVE NEGATIVE Final    Comment: (NOTE) The Xpert SA Assay (FDA approved for NASAL specimens in patients 55 years of age and older), is one component of a comprehensive surveillance program. It is not intended to diagnose infection nor to guide or monitor treatment. Performed at [facility], [place] [address], [place], [place] [address]          Radiology Studies: DG ELBOW COMPLETE LEFT (3+VIEW)  Result Date: 05/26/2022 CLINICAL DATA:  Pain after fall EXAM: LEFT ELBOW - COMPLETE 4 VIEW COMPARISON:  None Available. FINDINGS: No fracture or dislocation. Joint space loss seen about the elbow diffusely with some hyperostosis. No joint effusion on lateral view. Osteopenia. IMPRESSION:  Degenerative changes.  Osteopenia Electronically Signed   By: Jill Side M.D.   On: 05/26/2022 13:14   CT Hip Right Wo Contrast  Addendum Date: 05/25/2022   ADDENDUM REPORT: 05/25/2022 22:45 ADDENDUM: Images of the left hip are now made available for evaluation. An impacted subcapital left femoral neck fracture is present with fracture fragments in near anatomic alignment. No dislocation. Mild left hip degenerative arthritis. Diffuse osteopenia. Visualized left hemipelvis is intact. Electronically Signed   By: Fidela Salisbury M.D.   On: 05/25/2022 22:45   Result Date: 05/25/2022 CLINICAL DATA:  Hip trauma, fracture suspected. Witnessed fall with right hip pain. EXAM: CT OF THE RIGHT HIP WITHOUT CONTRAST TECHNIQUE: Multidetector CT imaging of the right hip was performed according to the standard protocol. Multiplanar CT image reconstructions were also generated. RADIATION DOSE REDUCTION: This exam was performed according to the departmental dose-optimization program which includes automated exposure control, adjustment of the mA and/or kV according to patient size and/or use of iterative reconstruction technique. COMPARISON:  None Available. FINDINGS: Bones/Joint/Cartilage Osseous structures are diffusely osteopenic. No acute fracture or dislocation. Mild right hip degenerative arthritis. Degenerative changes are noted at the lumbosacral junction. No lytic or blastic bone lesion. Ligaments Suboptimally assessed  by CT. Muscles and Tendons Unremarkable Soft tissues Sigmoid colonic diverticulosis. Moderate iliac atherosclerotic calcification. Small right fat containing inguinal hernia. IMPRESSION: 1. No acute fracture or dislocation of the right hip. Please note, prior plain film examination demonstrating a subcapital femoral neck fracture was of the LEFT hip and the given history reported LEFT hip pain. 2. Mild right hip degenerative arthritis. Electronically Signed: By: Fidela Salisbury M.D. On: 05/25/2022 22:06    DG Knee Complete 4 Views Left  Result Date: 05/25/2022 CLINICAL DATA:  Unwitnessed fall at 1600 hours today. EXAM: LEFT KNEE - COMPLETE 4+ VIEW COMPARISON:  None Available. FINDINGS: Mildly decreased bone mineralization. Moderate-to-large superior patellar degenerative osteophyte. Tiny inferior patellar degenerative osteophyte. Likely at least moderate patellofemoral joint space narrowing. Mild chronic enthesopathic change at the quadriceps insertion on the patella. No joint effusion.  No acute fracture or dislocation. Mild-to-moderate atherosclerotic calcifications. IMPRESSION: 1. No acute fracture. 2. Moderate patellofemoral osteoarthritis. Electronically Signed   By: Yvonne Kendall M.D.   On: 05/25/2022 19:09   DG Hip Unilat W or Wo Pelvis 2-3 Views Left  Result Date: 05/25/2022 CLINICAL DATA:  Unwitnessed fall today at 1600 hours. Left hip and left knee pain. EXAM: DG HIP (WITH OR WITHOUT PELVIS) 2-3V LEFT COMPARISON:  CT abdomen and pelvis 05/05/2016 FINDINGS: There is an acute fracture of the proximal left femoral neck with mild approximate 3-4 mm inferior and anterior displacement of the distal fracture component with respect to the proximal fracture component. Mild bilateral superomedial femoroacetabular joint space narrowing. Moderate bilateral superolateral acetabular degenerative osteophytosis. The pubic symphysis joint space is maintained. No acute fracture or dislocation. A vascular phlebolith overlies the left hemipelvis. IMPRESSION: Acute mildly displaced fracture of the proximal left femoral neck. Electronically Signed   By: Yvonne Kendall M.D.   On: 05/25/2022 19:06   DG Chest 2 View  Result Date: 05/25/2022 CLINICAL DATA:  Unwitnessed fall at 1600 hours today. EXAM: CHEST - 2 VIEW COMPARISON:  Chest radiographs 01/10/2018 and 02/25/2015 FINDINGS: Cardiac silhouette is at the upper limits of normal size for AP technique. Mildly decreased lung volumes. No focal airspace opacity. No pleural  effusion or pneumothorax. Mild-to-moderate multilevel degenerative disc changes of the thoracic spine. IMPRESSION: Mildly decreased lung volumes. No acute cardiopulmonary process. Electronically Signed   By: Yvonne Kendall M.D.   On: 05/25/2022 19:04   CT Head Wo Contrast  Result Date: 05/25/2022 CLINICAL DATA:  Trauma unwitnessed fall EXAM: CT HEAD WITHOUT CONTRAST CT CERVICAL SPINE WITHOUT CONTRAST TECHNIQUE: Multidetector CT imaging of the head and cervical spine was performed following the standard protocol without intravenous contrast. Multiplanar CT image reconstructions of the cervical spine were also generated. RADIATION DOSE REDUCTION: This exam was performed according to the departmental dose-optimization program which includes automated exposure control, adjustment of the mA and/or kV according to patient size and/or use of iterative reconstruction technique. COMPARISON:  CT brain and cervical spine 11/18/2019 FINDINGS: CT HEAD FINDINGS Brain: No acute territorial infarction, hemorrhage or intracranial mass. Mild atrophy. Mild white matter hypodensity consistent with chronic small vessel ischemic change. Stable ventricle size. Vascular: No hyperdense vessels.  Carotid vascular calcification Skull: Normal. Negative for fracture or focal lesion. Sinuses/Orbits: No acute finding. Other: None CT CERVICAL SPINE FINDINGS Alignment: No subluxation.  Facet alignment within normal limits Skull base and vertebrae: No acute fracture. No primary bone lesion or focal pathologic process. Soft tissues and spinal canal: No prevertebral fluid or swelling. No visible canal hematoma. Disc levels: Moderate degenerative changes at  C2-C3, C5-C6, C6-C7 and C7-T1. Facet degenerative changes at multiple levels with foraminal narrowing, worse at C5-C6. Upper chest: Negative. Other: Emphysema at the apices IMPRESSION: No CT evidence for acute intracranial abnormality. Atrophy and chronic small vessel ischemic changes of the white  matter. Degenerative changes of the cervical spine without acute osseous abnormality. Emphysema. Emphysema (ICD10-J43.9). Electronically Signed   By: Donavan Foil M.D.   On: 05/25/2022 18:20   CT Cervical Spine Wo Contrast  Result Date: 05/25/2022 CLINICAL DATA:  Trauma unwitnessed fall EXAM: CT HEAD WITHOUT CONTRAST CT CERVICAL SPINE WITHOUT CONTRAST TECHNIQUE: Multidetector CT imaging of the head and cervical spine was performed following the standard protocol without intravenous contrast. Multiplanar CT image reconstructions of the cervical spine were also generated. RADIATION DOSE REDUCTION: This exam was performed according to the departmental dose-optimization program which includes automated exposure control, adjustment of the mA and/or kV according to patient size and/or use of iterative reconstruction technique. COMPARISON:  CT brain and cervical spine 11/18/2019 FINDINGS: CT HEAD FINDINGS Brain: No acute territorial infarction, hemorrhage or intracranial mass. Mild atrophy. Mild white matter hypodensity consistent with chronic small vessel ischemic change. Stable ventricle size. Vascular: No hyperdense vessels.  Carotid vascular calcification Skull: Normal. Negative for fracture or focal lesion. Sinuses/Orbits: No acute finding. Other: None CT CERVICAL SPINE FINDINGS Alignment: No subluxation.  Facet alignment within normal limits Skull base and vertebrae: No acute fracture. No primary bone lesion or focal pathologic process. Soft tissues and spinal canal: No prevertebral fluid or swelling. No visible canal hematoma. Disc levels: Moderate degenerative changes at C2-C3, C5-C6, C6-C7 and C7-T1. Facet degenerative changes at multiple levels with foraminal narrowing, worse at C5-C6. Upper chest: Negative. Other: Emphysema at the apices IMPRESSION: No CT evidence for acute intracranial abnormality. Atrophy and chronic small vessel ischemic changes of the white matter. Degenerative changes of the cervical  spine without acute osseous abnormality. Emphysema. Emphysema (ICD10-J43.9). Electronically Signed   By: Donavan Foil M.D.   On: 05/25/2022 18:20        Scheduled Meds:  amLODipine  2.5 mg Oral Daily   aspirin  81 mg Oral Daily   carbidopa-levodopa  1 tablet Oral TID with meals   insulin aspart  0-9 Units Subcutaneous Q4H   memantine  10 mg Oral BID   povidone-iodine  2 Application Topical Once   traZODone  50 mg Oral QHS   Continuous Infusions:   ceFAZolin (ANCEF) IV     lactated ringers 125 mL/hr at 05/27/22 [id number]   tranexamic acid            Aline August, MD [facility] 05/27/2022, 9:50 AM

## 2022-05-27 NOTE — Anesthesia Procedure Notes (Signed)
Procedure Name: Intubation Date/Time: 05/27/2022 2:29 PM  Performed by: Gerald Leitz, CRNAPre-anesthesia Checklist: Patient identified, Patient being monitored, Timeout performed, Emergency Drugs available and Suction available Patient Re-evaluated:Patient Re-evaluated prior to induction Oxygen Delivery Method: Circle system utilized Preoxygenation: Pre-oxygenation with 100% oxygen Induction Type: IV induction Ventilation: Mask ventilation without difficulty Laryngoscope Size: Mac and 3 Grade View: Grade I Tube type: Oral Tube size: 7.5 mm Number of attempts: 1 Airway Equipment and Method: Stylet Placement Confirmation: ETT inserted through vocal cords under direct vision, positive ETCO2 and breath sounds checked- equal and bilateral Secured at: 23 cm Tube secured with: Tape Dental Injury: Teeth and Oropharynx as per pre-operative assessment

## 2022-05-27 NOTE — Anesthesia Preprocedure Evaluation (Signed)
Anesthesia Evaluation  Patient identified by MRN, date of birth, ID band Patient awake    Reviewed: Allergy & Precautions, H&P , NPO status , Patient's Chart, lab work & pertinent test results  Airway Mallampati: II  TM Distance: >3 FB Neck ROM: Full    Dental no notable dental hx.    Pulmonary Current Smoker and Patient abstained from smoking.   Pulmonary exam normal breath sounds clear to auscultation       Cardiovascular + CAD, + Past MI and + Cardiac Stents  Normal cardiovascular exam Rhythm:Regular Rate:Normal     Neuro/Psych       Dementia Parkinsons dz  Neuromuscular disease  negative psych ROS   GI/Hepatic negative GI ROS, Neg liver ROS,,,  Endo/Other  negative endocrine ROSdiabetes, Type 2    Renal/GU negative Renal ROS  negative genitourinary   Musculoskeletal negative musculoskeletal ROS (+)    Abdominal   Peds negative pediatric ROS (+)  Hematology negative hematology ROS (+)   Anesthesia Other Findings   Reproductive/Obstetrics negative OB ROS                             Anesthesia Physical Anesthesia Plan  ASA: 3  Anesthesia Plan: General   Post-op Pain Management: Minimal or no pain anticipated   Induction: Intravenous  PONV Risk Score and Plan: 1 and Ondansetron, Dexamethasone and Treatment may vary due to age or medical condition  Airway Management Planned: Oral ETT  Additional Equipment:   Intra-op Plan:   Post-operative Plan: Extubation in OR  Informed Consent: I have reviewed the patients History and Physical, chart, labs and discussed the procedure including the risks, benefits and alternatives for the proposed anesthesia with the patient or authorized representative who has indicated his/her understanding and acceptance.     Dental advisory given  Plan Discussed with: CRNA and Surgeon  Anesthesia Plan Comments:        Anesthesia Quick  Evaluation

## 2022-05-28 ENCOUNTER — Encounter (HOSPITAL_COMMUNITY): Payer: Self-pay | Admitting: Orthopedic Surgery

## 2022-05-28 DIAGNOSIS — S72142A Displaced intertrochanteric fracture of left femur, initial encounter for closed fracture: Secondary | ICD-10-CM | POA: Diagnosis not present

## 2022-05-28 LAB — GLUCOSE, CAPILLARY
Glucose-Capillary: 154 mg/dL — ABNORMAL HIGH (ref 70–99)
Glucose-Capillary: 116 mg/dL — ABNORMAL HIGH (ref 70–99)
Glucose-Capillary: 138 mg/dL — ABNORMAL HIGH (ref 70–99)
Glucose-Capillary: 152 mg/dL — ABNORMAL HIGH (ref 70–99)
Glucose-Capillary: 202 mg/dL — ABNORMAL HIGH (ref 70–99)
Glucose-Capillary: 120 mg/dL — ABNORMAL HIGH (ref 70–99)

## 2022-05-28 LAB — CBC
HCT: 42.2 % (ref 39.0–52.0)
Hemoglobin: 14 g/dL (ref 13.0–17.0)
MCH: 31.7 pg (ref 26.0–34.0)
MCHC: 33.2 g/dL (ref 30.0–36.0)
MCV: 95.7 fL (ref 80.0–100.0)
Platelets: 153 10*3/uL (ref 150–400)
RBC: 4.41 MIL/uL (ref 4.22–5.81)
RDW: 12.7 % (ref 11.5–15.5)
WBC: 12.2 10*3/uL — ABNORMAL HIGH (ref 4.0–10.5)
nRBC: 0 % (ref 0.0–0.2)

## 2022-05-28 LAB — BASIC METABOLIC PANEL
Anion gap: 10 (ref 5–15)
BUN: 22 mg/dL (ref 8–23)
CO2: 25 mmol/L (ref 22–32)
Calcium: 8.5 mg/dL — ABNORMAL LOW (ref 8.9–10.3)
Chloride: 101 mmol/L (ref 98–111)
Creatinine, Ser: 1.19 mg/dL (ref 0.61–1.24)
GFR, Estimated: >60 mL/min (ref >60)
Glucose, Bld: 119 mg/dL — ABNORMAL HIGH (ref 70–99)
Potassium: 3.7 mmol/L (ref 3.5–5.1)
Sodium: 136 mmol/L (ref 135–145)

## 2022-05-28 MED ORDER — ENSURE ENLIVE PO LIQD
237.0000 mL | Freq: Two times a day (BID) | ORAL | Status: DC
  Administered 2022-05-28 – 2022-06-01 (×6): 237 mL via ORAL

## 2022-05-28 NOTE — Progress Notes (Signed)
PROGRESS NOTE    Joe Murray  [id number] DOB: [birth year] DOA: 05/25/2022 PCP: Laurey Morale, MD   Brief Narrative:  82 year old male with history of Parkinson disease, dementia presented after a fall at home resulting in a left femoral neck fracture.  He underwent surgical intervention by orthopedics on 05/27/2022.  Assessment & Plan:   Left femoral neck fracture after a fall -underwent surgical intervention by orthopedics on 05/27/2022.  Pain management/wound care/activity/DVT prophylaxis as per orthopedics recommendations. -Fall precautions -PT/OT eval.  CKD stage IIIa -Creatinine stable.  No labs today  Leukocytosis -Possibly reactive.  No labs.  Diabetes mellitus type 2 with hyperglycemia -Continue CBGs with SSI.  Glipizide on hold.  Hypertension -Monitor blood pressure.  Continue amlodipine  Parkinson's disease Parkinson's dementia with hallucinations, paranoia, agitation, disturbed wake/sleep cycle -Continue Sinemet and memantine -Seroquel started on 05/27/2022 after discussion with patient's daughter.  Continue the same for now. -Continue Ativan as needed for anxiety.   -Consult palliative care for goals of care discussion.  Currently full code.  CAD status post stenting -Most recent PCI in January 2017 with DES to RCA and mid LAD. -Continue aspirin.  Not on beta-blocker due to history of bradycardia. -On Repatha as an outpatient.  Not on statin due to myalgias    DVT prophylaxis: Lovenox Code Status: Full Family Communication: wife at bedside Disposition Plan: Status is: Inpatient Remains inpatient appropriate because: Of severity of illness   Consultants: Orthopedics.  palliative care  Procedures: As above Antimicrobials: Perioperative   Subjective: Patient seen and examined at bedside.  Awake but confused.  No fever, agitation, seizures reported.  Nursing staff reports that patient slept better overnight. Objective: Vitals:   05/27/22 1850  05/27/22 2011 05/27/22 2347 05/28/22 0354  BP: (!) 150/98 (!) 160/78 121/62 (!) 165/92  Pulse: 82 64 (!) 47 75  Resp: 18 14 16 17   Temp: (!) 97.5 F (36.4 C) 97.8 F (36.6 C) 98.5 F (36.9 C) 98.5 F (36.9 C)  TempSrc: Oral Oral Oral Oral  SpO2: 100% 92% 97% 91%  Weight:      Height:        Intake/Output Summary (Last 24 hours) at 05/28/2022 0724 Last data filed at 05/28/2022 0600 Gross per 24 hour  Intake 2605.18 ml  Output 2170 ml  Net 435.18 ml    Filed Weights   05/26/22 1628  Weight: 98.5 kg    Examination:  General: On room air.  No distress.  Chronically ill and deconditioned appearing.  Elderly male lying in bed.  Has hand mittens. ENT/neck: No thyromegaly.  JVD is not elevated  respiratory: Decreased breath sounds at bases bilaterally with some crackles; no wheezing  CVS: S1-S2 heard, rate controlled currently Abdominal: Soft, nontender, slightly distended; no organomegaly, bowel sounds are heard Extremities: Trace lower extremity edema; no cyanosis  CNS: Awake, still slow to respond, confused.  No focal neurologic deficit.  Moves extremities Lymph: No obvious lymphadenopathy Skin: No obvious ecchymosis/lesions  psych: Flat affect.  Not agitated.   Musculoskeletal: No obvious joint swelling/deformity   Data Reviewed: I have personally reviewed following labs and imaging studies  CBC: Recent Labs  Lab 05/25/22 1827 05/26/22 0147  WBC 14.3* 12.5*  NEUTROABS 12.4*  --   HGB 15.1 13.4  HCT 45.4 40.0  MCV 95.6 95.0  PLT 185 [id number]    Basic Metabolic Panel: Recent Labs  Lab 05/25/22 1827 05/26/22 0147  NA 139 136  K 4.0 3.6  CL 105 104  CO2  22 23  GLUCOSE 179* 209*  BUN 22 20  CREATININE 1.35* 1.24  CALCIUM 9.1 8.8*    GFR: Estimated Creatinine Clearance: 52.2 mL/min (by C-G formula based on SCr of 1.24 mg/dL). Liver Function Tests: Recent Labs  Lab 05/25/22 1827  AST 26  ALT 20  ALKPHOS 62  BILITOT 0.5  PROT 7.4  ALBUMIN 4.3    No  results for input(s): "LIPASE", "AMYLASE" in the last 168 hours. No results for input(s): "AMMONIA" in the last 168 hours. Coagulation Profile: No results for input(s): "INR", "PROTIME" in the last 168 hours. Cardiac Enzymes: No results for input(s): "CKTOTAL", "CKMB", "CKMBINDEX", "TROPONINI" in the last 168 hours. BNP (last 3 results) No results for input(s): "PROBNP" in the last 8760 hours. HbA1C: No results for input(s): "HGBA1C" in the last 72 hours. CBG: Recent Labs  Lab 05/27/22 1233 05/27/22 1700 05/27/22 2010 05/27/22 2348 05/28/22 0355  GLUCAP 131* 196* 213* 185* 154*    Lipid Profile: No results for input(s): "CHOL", "HDL", "LDLCALC", "TRIG", "CHOLHDL", "LDLDIRECT" in the last 72 hours. Thyroid Function Tests: No results for input(s): "TSH", "T4TOTAL", "FREET4", "T3FREE", "THYROIDAB" in the last 72 hours. Anemia Panel: No results for input(s): "VITAMINB12", "FOLATE", "FERRITIN", "TIBC", "IRON", "RETICCTPCT" in the last 72 hours. Sepsis Labs: No results for input(s): "PROCALCITON", "LATICACIDVEN" in the last 168 hours.  Recent Results (from the past 240 hour(s))  Surgical PCR screen     Status: None   Collection Time: 05/26/22  2:22 PM   Specimen: Nasal Mucosa; Nasal Swab  Result Value Ref Range Status   MRSA, PCR NEGATIVE NEGATIVE Final   Staphylococcus aureus NEGATIVE NEGATIVE Final    Comment: (NOTE) The Xpert SA Assay (FDA approved for NASAL specimens in patients 35 years of age and older), is one component of a comprehensive surveillance program. It is not intended to diagnose infection nor to guide or monitor treatment. Performed at [facility] [place] [address] [place], [place] [address]          Radiology Studies: DG HIP UNILAT W OR W/O PELVIS 2-3 VIEWS LEFT  Result Date: 05/27/2022 CLINICAL DATA:  Insert is EXAM: DG HIP (WITH OR WITHOUT PELVIS) 2-3V LEFT COMPARISON:  None Available. FINDINGS: Postsurgical changes of left  femoral neck fixation. Hardware is intact without evidence of immediate complication. Expected soft tissue changes. IMPRESSION: Postsurgical changes of left femoral neck fixation. Hardware is intact without evidence of immediate complication. Electronically Signed   By: Maurine Simmering M.D.   On: 05/27/2022 16:40   DG HIP UNILAT WITH PELVIS 1V LEFT  Result Date: 05/27/2022 CLINICAL DATA:  [id number] Surgery, elective [id number] EXAM: DG HIP (WITH OR WITHOUT PELVIS) 1V*L* COMPARISON:  CT 05/25/2022 FINDINGS: Intraoperative images during left femoral neck fixation with percutaneous screws for subcapital femoral neck fracture. IMPRESSION: Intraoperative images during left femoral neck fixation with percutaneous screws. Electronically Signed   By: Maurine Simmering M.D.   On: 05/27/2022 16:39   DG C-Arm 1-60 Min-No Report  Result Date: 05/27/2022 Fluoroscopy was utilized by the requesting physician.  No radiographic interpretation.   DG ELBOW COMPLETE LEFT (3+VIEW)  Result Date: 05/26/2022 CLINICAL DATA:  Pain after fall EXAM: LEFT ELBOW - COMPLETE 4 VIEW COMPARISON:  None Available. FINDINGS: No fracture or dislocation. Joint space loss seen about the elbow diffusely with some hyperostosis. No joint effusion on lateral view. Osteopenia. IMPRESSION: Degenerative changes.  Osteopenia Electronically Signed   By: Jill Side M.D.   On: 05/26/2022 13:14  Scheduled Meds:  amLODipine  2.5 mg Oral Daily   aspirin  81 mg Oral Daily   carbidopa-levodopa  1 tablet Oral TID with meals   enoxaparin (LOVENOX) injection  40 mg Subcutaneous Q24H   insulin aspart  0-9 Units Subcutaneous Q4H   memantine  10 mg Oral BID   QUEtiapine  50 mg Oral QHS   Continuous Infusions:  lactated ringers 75 mL/hr at 05/27/22 1652          Krisy Dix Starla Link, MD [facility] 05/28/2022, 7:24 AM

## 2022-05-28 NOTE — Progress Notes (Signed)
     Subjective:  Patient's wife at bedside.  She reports that he slept very well overnight.  Remains remains very confused.  Alzheimer's at baseline.  Objective:   VITALS:   Vitals:   05/27/22 1850 05/27/22 2011 05/27/22 2347 05/28/22 0354  BP: (!) 150/98 (!) 160/78 121/62 (!) 165/92  Pulse: 82 64 (!) 47 75  Resp: 18 14 16 17   Temp: (!) 97.5 F (36.4 C) 97.8 F (36.6 C) 98.5 F (36.9 C) 98.5 F (36.9 C)  TempSrc: Oral Oral Oral Oral  SpO2: 100% 92% 97% 91%  Weight:      Height:        Sensation intact distally Intact pulses distally Dorsiflexion/Plantar flexion intact Incision: dressing C/D/I No cellulitis present Compartment soft   Lab Results  Component Value Date   WBC 12.5 (H) 05/26/2022   HGB 13.4 05/26/2022   HCT 40.0 05/26/2022   MCV 95.0 05/26/2022   PLT 163 05/26/2022   BMET    Component Value Date/Time   NA 136 05/26/2022 0147   NA 142 06/01/2019 0824   K 3.6 05/26/2022 0147   CL 104 05/26/2022 0147   CO2 23 05/26/2022 0147   GLUCOSE 209 (H) 05/26/2022 0147   BUN 20 05/26/2022 0147   BUN 22 06/01/2019 0824   CREATININE 1.24 05/26/2022 0147   CREATININE 1.26 (H) 11/07/2019 1048   CALCIUM 8.8 (L) 05/26/2022 0147   GFRNONAA 58 (L) 05/26/2022 0147   Xray: Cannulated screws in good position femoral neck fracture well aligned no adverse  Assessment/Plan: 1 Day Post-Op   Principal Problem:   Closed intertrochanteric fracture of left femur, initial encounter Active Problems:   CAD S/P percutaneous coronary angioplasty - LADx2, & RCA x 3   DM (diabetes mellitus), type 2 with complications - CAD   Parkinson's disease   Chronic kidney disease, stage 3a  Status post left hip CRPP for valgus impacted femoral neck fracture 05/27/22  Post op recs: WB: WBAT  Abx: ancef x23 hours post op Imaging: PACU xrays Dressing: keep intact until follow up, change PRN if soiled or saturated. DVT prophylaxis: lovenox starting POD1 x4 weeks Follow up: 2 weeks  after surgery for a wound check with Dr. Zachery Dakins at [facility].  Address: [address] [place], [place], [place] [address]  Office Phone: [phone]  Willaim Sheng 05/28/2022, 7:06 AM   Charlies Constable, MD  Contact information:   [phone] 7am-5pm epic message Dr. Zachery Dakins, or call office for patient follow up: [phone] After hours and holidays please check [email or web address] for group call information for [facility]

## 2022-05-28 NOTE — TOC PASRR Note (Signed)
Transition of Care (TOC) -30 day Note       Patient Details  Name: Smit Trible MRN:  [id number] Date of Birth:  [birth year]   [facility]) CM/SW Contact  Name:  Lennart Pall, [place] Phone Number:  [phone] Date:  05/28/2022 Time:  1422   MUST ID: [id number]   To Whom it May Concern:   Please be advised that the above patient will require a short-term nursing home stay, anticipated 30 days or less rehabilitation and strengthening. The plan is for return home.

## 2022-05-28 NOTE — TOC Initial Note (Signed)
Transition of Care [facility]) - Initial/Assessment Note    Patient Details  Name: Joe Murray MRN: [id number] Date of Birth: 02-05-1941  Transition of Care [facility]) CM/SW Contact:    Lennart Pall, LCSW Phone Number: 05/28/2022, 1:55 PM  Clinical Narrative:                  Met with pt and wife today to introduce TOC/ CSW role with dc planning.  Pt lying in bed with mittens on to deter pulling at lines and mumbles quietly while I speak with wife.  Pt does not engage and not oriented.  Wife states pt is just "talking out of his mind now" (since surgery).  Wife does report that pt was diagnosed with Parkinsons Dz ~ 2 yrs ago and on medication.  His baseline mobility is mod independent with a RW and uses a lift chair in the home.  She does note that he has had several falls since Dec but no prior injuries.  She, also, notes that his cognition was stable enough that she could leave him at home for a couple of hours if needed.   Wife notes significantly worsened cognition now and that he is having some visual hallucinations (thinking he is in his auto garage).  At this point, wife anticipates need for SNF rehab but notes he was not able to do much with PT on eval today.  TOC will begin SNF work up and PASRR clearance (given dementia hx).           Expected Discharge Plan: Skilled Nursing Facility Barriers to Discharge: Continued Medical Work up, Ship broker, SNF Pending bed offer, Environmental education officer)   Patient Goals and CMS Choice Patient states their goals for this hospitalization and ongoing recovery are:: hopes to return home following SNF rehab          Expected Discharge Plan and Services In-house Referral: Clinical Social Work   Post Acute Care Choice: [place] Living arrangements for the past 2 months: [place]                                      Prior Living Arrangements/Services Living arrangements for the past 2  months: [place] with:: Spouse Patient language and need for interpreter reviewed:: Yes Do you feel safe going back to the place where you live?: Yes      Need for Family Participation in Patient Care: Yes (Comment) Care giver support system in place?: Yes (comment)   Criminal Activity/Legal Involvement Pertinent to Current Situation/Hospitalization: No - Comment as needed  Activities of Daily Living Home Assistive Devices/Equipment: Eyeglasses, Environmental consultant (specify type), Wheelchair ADL Screening (condition at time of admission) Patient's cognitive ability adequate to safely complete daily activities?: No Is the patient deaf or have difficulty hearing?: No Does the patient have difficulty seeing, even when wearing glasses/contacts?: No (uses eyeglasses) Does the patient have difficulty concentrating, remembering, or making decisions?: Yes Patient able to express need for assistance with ADLs?: Yes Does the patient have difficulty dressing or bathing?: Yes Independently performs ADLs?: No Communication: Independent Dressing (OT): Needs assistance Is this a change from baseline?: Change from baseline, expected to last <3days Grooming: Needs assistance Is this a change from baseline?: Change from baseline, expected to last >3 days Feeding: Independent Bathing: Needs assistance Toileting: Needs assistance Is this a change from baseline?: Change from baseline,  expected to last >3days In/Out Bed: Needs assistance Walks in Home: Needs assistance Does the patient have difficulty walking or climbing stairs?: Yes Weakness of Legs: Left Weakness of Arms/Hands: None  Permission Sought/Granted Permission sought to share information with : Family Supports    Share Information with NAME: wife, Nashawn Dealmeida @ [phone]           Emotional Assessment Appearance:: Appears stated age Attitude/Demeanor/Rapport: Unable to Assess, Lethargic Affect (typically observed): Unable  to Assess Orientation: : Oriented to Self Alcohol / Substance Use: Not Applicable Psych Involvement: No (comment)  Admission diagnosis:  Closed intertrochanteric fracture of left femur, initial encounter [S72.142A] Closed fracture of left hip, initial encounter [S72.002A] Patient Active Problem List   Diagnosis Date Noted   Closed intertrochanteric fracture of left femur, initial encounter 05/25/2022   Chronic kidney disease, stage 3a 05/25/2022   Myalgia due to statin 01/27/2021   Parkinson's disease 11/07/2020   Alzheimer's dementia 11/07/2020   Intermittent claudication 12/01/2017   Trochanteric bursitis of right hip 09/04/2017   Fuchs' corneal dystrophy 08/14/2016   Essential hypertension -> allowing permissive hypertension.  Medication stopped because of fatigue and orthostatic dizziness. 03/05/2015   NSTEMI (non-ST elevated myocardial infarction)    Coronary artery disease involving native coronary artery of native heart without angina pectoris    Vitamin D deficiency 07/04/2014   Fatigue 01/08/2014   Obesity (BMI 30-39.9) 01/12/2013   DM (diabetes mellitus), type 2 with complications - CAD    Dyslipidemia, goal LDL below 70    Exertional shortness of breath 01/12/2012   Chronotropic incompetence 12/25/2011   Vertigo with mild postural lightheadedness. 08/13/2009   ERECTILE DYSFUNCTION, ORGANIC 05/01/2009   GERD 04/18/2008   BPH with urinary obstruction 07/27/2007   CAD S/P percutaneous coronary angioplasty - LADx2, & RCA x 3 07/12/2001    Class: History of   PCP:  Laurey Morale, MD Pharmacy:   [place], [place] - [address] [place] [place] [address] Phone: [phone] Fax: [place], [place] [id number] [place] [address] Phone: [phone] Fax: [phone]     Social Determinants of Health (SDOH) Social History: SDOH  Screenings   Food Insecurity: No Food Insecurity (05/26/2022)  Housing: Low Risk  (05/26/2022)  Transportation Needs: No Transportation Needs (05/26/2022)  Utilities: Not At Risk (05/26/2022)  Alcohol Screen: Low Risk  (03/30/2022)  Depression (PHQ2-9): Low Risk  (03/30/2022)  Financial Resource Strain: Low Risk  (03/30/2022)  Recent Concern: Financial Resource Strain - Medium Risk (01/06/2022)  Physical Activity: Inactive (03/30/2022)  Social Connections: Socially Integrated (03/30/2022)  Stress: No Stress Concern Present (03/30/2022)  Tobacco Use: High Risk (05/28/2022)   SDOH Interventions:     Readmission Risk Interventions     No data to display

## 2022-05-28 NOTE — Progress Notes (Signed)
Patient slept from 1900-2100. Patient woke up to take pills and requested sips of water.   Patient was asleep majority of the night from 2300-0530.

## 2022-05-28 NOTE — Evaluation (Signed)
Physical Therapy Evaluation Patient Details Name: Joe Murray MRN: [id number] DOB: 30-Dec-1940 Today's Date: 05/28/2022  History of Present Illness  82 year old male with history of Parkinson disease, SVT, vertigo, DM,sinus bradycardia, NSTEMI, dementia presented after a fall at home resulting in a left femoral neck fracture.  He underwent Cannulated screw fixation of left valgus impacted femoral neck fracture on 05/27/22  Clinical Impression  Pt admitted with above diagnosis.  Pt currently with functional limitations due to the deficits listed below (see PT Problem List). Pt will benefit from acute skilled PT to increase their independence and safety with mobility to allow discharge.     The patient's wife present to provide information related to PLOF.  Patient ambulatory with RW at baseline, does forget it and had wandered outside and fell.  Patient generally continent of B/B and feeds self.  Patient  sleepy but did participate in mobility and attempted standing at RW x 3 with max support of 2 persons.  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 Wife agrees.   Recommendations for follow up therapy are one component of a multi-disciplinary discharge planning process, led by the attending physician.  Recommendations may be updated based on patient status, additional functional criteria and insurance authorization.  Follow Up Recommendations Can patient physically be transported by private vehicle: No     Assistance Recommended at Discharge Frequent or constant Supervision/Assistance  Patient can return home with the following  Two people to help with walking and/or transfers;Two people to help with bathing/dressing/bathroom    Equipment Recommendations None recommended by PT  Recommendations for Other Services       Functional Status  Assessment Patient has had a recent decline in their functional status and demonstrates the ability to make significant improvements in function in a reasonable and predictable amount of time.     Precautions / Restrictions Precautions Precautions: Fall Precaution Comments: neds sinemet on board Restrictions Weight Bearing Restrictions: No      Mobility  Bed Mobility Overal bed mobility: Needs Assistance Bed Mobility: Rolling, Supine to Sit, Sit to Supine Rolling: +2 for physical assistance, +2 for safety/equipment, Total assist   Supine to sit: Total assist, +2 for physical assistance, +2 for safety/equipment Sit to supine: Total assist, +2 for physical assistance, +2 for safety/equipment   General bed mobility comments: assisted to move legs and trunk to spin on bed with bed pad and sit trunk upright, bed pad to slide to edge. Total assistance to return to supine, assisting legs and trunk. Patient yelling out as patient's legs moved.    Transfers Overall transfer level: Needs assistance Equipment used: Rolling walker (2 wheels) Transfers: Sit to/from Stand Sit to Stand: Total assist, +2 physical assistance, +2 safety/equipment, From elevated surface           General transfer comment: attempted x 3 to stand at RW, using bed to lift and bed pad. patient able to lift  buttocks a few inches for only briefly.    Ambulation/Gait                  Stairs            Wheelchair Mobility    Modified Rankin (Stroke Patients Only)       Balance Overall balance assessment: Needs assistance, History of Falls Sitting-balance support: Feet supported, No upper  extremity supported Sitting balance-Leahy Scale: Poor Sitting balance - Comments: once seated on bed  edge, patient liting to right but holding balance Postural control: Right lateral lean                                   Pertinent Vitals/Pain Pain Assessment Breathing: occasional labored  breathing, short period of hyperventilation Negative Vocalization: occasional moan/groan, low speech, negative/disapproving quality Facial Expression: facial grimacing Body Language: tense, distressed pacing, fidgeting Consolability: no need to console PAINAD Score: 5    Home Living Family/patient expects to be discharged to:: Private residence Living Arrangements: Spouse/significant other Available Help at Discharge: Family;Available 24 hours/day Type of Home: House Home Access: Level entry       Home Layout: Two level;Able to live on main level with bedroom/bathroom Home Equipment: Rolling Walker (2 wheels);Cane - single point      Prior Function Prior Level of Function : Needs assist  Cognitive Assist : Mobility (cognitive);ADLs (cognitive) Mobility (Cognitive): Set up cues ADLs (Cognitive): Set up cues       Mobility Comments: ambulates with RW/cane ADLs Comments: knows when needs to  void/BM, may not make it. feeds self, wife assists with dressing /shower     Hand Dominance        Extremity/Trunk Assessment   Upper Extremity Assessment Upper Extremity Assessment: Overall WFL for tasks assessed    Lower Extremity Assessment Lower Extremity Assessment: LLE deficits/detail LLE Deficits / Details: does not  tolerate WB, did tolerate PROM through 20% of range    Cervical / Trunk Assessment Cervical / Trunk Assessment: Normal  Communication      Cognition Arousal/Alertness: Awake/alert Behavior During Therapy: Flat affect Overall Cognitive Status: Impaired/Different from baseline                                 General Comments: per wife, patient more confused, at baseline able to follow simple directions, fidgety now        General Comments      Exercises General Exercises - Lower Extremity Heel Slides: AAROM, Both, 10 reps, Supine   Assessment/Plan    PT Assessment Patient needs continued PT services  PT Problem List Decreased  strength;Decreased balance;Decreased cognition;Decreased knowledge of precautions;Decreased range of motion;Decreased mobility;Decreased knowledge of use of DME;Pain;Decreased activity tolerance;Decreased safety awareness       PT Treatment Interventions DME instruction;Functional mobility training;Patient/family education;Gait training;Therapeutic activities;Therapeutic exercise;Cognitive remediation    PT Goals (Current goals can be found in the Care Plan section)  Acute Rehab PT Goals Patient Stated Goal: per wife, to be able to walk PT Goal Formulation: With family Time For Goal Achievement: 06/11/22 Potential to Achieve Goals: Fair    Frequency Min 3X/week     Co-evaluation               AM-PAC PT "6 Clicks" Mobility  Outcome Measure Help needed turning from your back to your side while in a flat bed without using bedrails?: Total Help needed moving from lying on your back to sitting on the side of a flat bed without using bedrails?: Total Help needed moving to and from a bed to a chair (including a wheelchair)?: Total Help needed standing up from a chair using your arms (e.g., wheelchair or bedside chair)?: Total Help needed to walk in hospital room?: Total Help needed climbing 3-5 steps  with a railing? : Total 6 Click Score: 6    End of Session Equipment Utilized During Treatment: Gait belt Activity Tolerance: Patient tolerated treatment well Patient left: in bed;with call bell/phone within reach;with family/visitor present Nurse Communication: Mobility status;Need for lift equipment PT Visit Diagnosis: Muscle weakness (generalized) (M62.81);History of falling (Z91.81)    Time: [id number] PT Time Calculation (min) (ACUTE ONLY): 22 min   Charges:   PT Evaluation $PT Eval Low Complexity: 1 Low          [place] Office [id number] Weekend [id number]   Claretha Cooper 05/28/2022, 2:28 PM

## 2022-05-28 NOTE — [place] FL2 (Addendum)
[place] MEDICAID FL2 LEVEL OF CARE FORM     IDENTIFICATION  Patient Name: Joe Murray Birthdate: [birth year] Sex: male Admission Date (Current Location): 05/25/2022  [facility] and [place] Number:  Producer, television/film/video and Address:  [facility],  [address] [place] [address]      Provider Number: [id number]  Attending Physician Name and Address:  Glade Lloyd, MD  Relative Name and Phone Number:  wife, Lexie Swenson @ [phone]    Current Level of Care: Hospital Recommended Level of Care: Skilled Nursing Facility Prior Approval Number:    Date Approved/Denied:   PASRR Number: [id number]  Discharge Plan: SNF    Current Diagnoses: Patient Active Problem List   Diagnosis Date Noted   Closed intertrochanteric fracture of left femur, initial encounter 05/25/2022   Chronic kidney disease, stage 3a 05/25/2022   Myalgia due to statin 01/27/2021   Parkinson's disease 11/07/2020   Alzheimer's dementia 11/07/2020   Intermittent claudication 12/01/2017   Trochanteric bursitis of right hip 09/04/2017   Fuchs' corneal dystrophy 08/14/2016   Essential hypertension -> allowing permissive hypertension.  Medication stopped because of fatigue and orthostatic dizziness. 03/05/2015   NSTEMI (non-ST elevated myocardial infarction)    Coronary artery disease involving native coronary artery of native heart without angina pectoris    Vitamin D deficiency 07/04/2014   Fatigue 01/08/2014   Obesity (BMI 30-39.9) 01/12/2013   DM (diabetes mellitus), type 2 with complications - CAD    Dyslipidemia, goal LDL below 70    Exertional shortness of breath 01/12/2012   Chronotropic incompetence 12/25/2011   Vertigo with mild postural lightheadedness. 08/13/2009   ERECTILE DYSFUNCTION, ORGANIC 05/01/2009   GERD 04/18/2008   BPH with urinary obstruction 07/27/2007   CAD S/P percutaneous coronary angioplasty - LADx2, & RCA x 3 07/12/2001    Orientation RESPIRATION  BLADDER Height & Weight     Self  Normal Incontinent, External catheter Weight: 217 lb 2.5 oz (98.5 kg) Height:  5\' 8"  (172.7 cm)  BEHAVIORAL SYMPTOMS/MOOD NEUROLOGICAL BOWEL NUTRITION STATUS      Incontinent Diet (regular)  AMBULATORY STATUS COMMUNICATION OF NEEDS Skin   Extensive Assist Verbally Normal                       Personal Care Assistance Level of Assistance  Bathing, Feeding, Dressing, Total care Bathing Assistance: Maximum assistance Feeding assistance: Limited assistance Dressing Assistance: Limited assistance Total Care Assistance: Maximum assistance   Functional Limitations Info  Hearing, Speech, Sight Sight Info: Adequate Hearing Info: Adequate Speech Info: Adequate    SPECIAL CARE FACTORS FREQUENCY  PT (By licensed PT), OT (By licensed OT)     PT Frequency: 5x/wk OT Frequency: 5x/wk            Contractures Contractures Info: Not present    Additional Factors Info  Code Status, Allergies, Psychotropic, Insulin Sliding Scale Code Status Info: Full Allergies Info: Beta Adrenergic Blockers, Praluent (Alirocumab), Sulfonamide Derivatives, Statins Psychotropic Info: see MAR Insulin Sliding Scale Info: see MAR       Current Medications (05/29/2022):  This is the current hospital active medication list Current Facility-Administered Medications  Medication Dose Route Frequency Provider Last Rate Last Admin   amLODipine (NORVASC) tablet 2.5 mg  2.5 mg Oral Daily Kathie Dike M, PA-C   2.5 mg at 05/29/22 2633   aspirin chewable tablet 81 mg  81 mg Oral Daily [name], PA-C   81 mg at 05/29/22 3545  carbidopa-levodopa (SINEMET IR) 25-100 MG per tablet immediate release 1 tablet  1 tablet Oral TID with meals [name], PA-C   1 tablet at 05/29/22 0837   enoxaparin (LOVENOX) injection 40 mg  40 mg Subcutaneous Q24H Kathie Dike M, PA-C   40 mg at 05/29/22 1962   feeding supplement (ENSURE ENLIVE / ENSURE PLUS) liquid 237 mL   237 mL Oral BID BM Glade Lloyd, MD   237 mL at 05/29/22 0935   HYDROcodone-acetaminophen (NORCO/VICODIN) 5-325 MG per tablet 1-2 tablet  1-2 tablet Oral Q6H PRN [name], PA-C   2 tablet at 05/29/22 2297   HYDROmorphone (DILAUDID) injection 0.5 mg  0.5 mg Intravenous Q2H PRN [name], PA-C   0.5 mg at 05/27/22 1808   insulin aspart (novoLOG) injection 0-9 Units  0-9 Units Subcutaneous Q4H [name], PA-C   1 Units at 05/29/22 9892   lactated ringers infusion   Intravenous Continuous Glade Lloyd, MD 50 mL/hr at 05/29/22 0008 New Bag at 05/29/22 0008   LORazepam (ATIVAN) tablet 0.5 mg  0.5 mg Oral Q6H PRN [name], PA-C       memantine [facility]) tablet 10 mg  10 mg Oral BID Kathie Dike M, PA-C   10 mg at 05/29/22 0837   ondansetron (ZOFRAN) injection 4 mg  4 mg Intravenous Q6H PRN Kathie Dike M, PA-C       QUEtiapine (SEROQUEL) tablet 50 mg  50 mg Oral QHS Kathie Dike M, PA-C   50 mg at 05/28/22 2105   senna-docusate (Senokot-S) tablet 1 tablet  1 tablet Oral QHS PRN [name], PA-C         Discharge Medications: Please see discharge summary for a list of discharge medications.  Relevant Imaging Results:  Relevant Lab Results:   Additional Information SS# [id number]  Amada Jupiter, LCSW

## 2022-05-29 DIAGNOSIS — Z515 Encounter for palliative care: Secondary | ICD-10-CM | POA: Diagnosis not present

## 2022-05-29 DIAGNOSIS — Z7189 Other specified counseling: Secondary | ICD-10-CM

## 2022-05-29 DIAGNOSIS — Z66 Do not resuscitate: Secondary | ICD-10-CM

## 2022-05-29 DIAGNOSIS — G8918 Other acute postprocedural pain: Secondary | ICD-10-CM

## 2022-05-29 DIAGNOSIS — G20A2 Parkinson's disease without dyskinesia, with fluctuations: Secondary | ICD-10-CM | POA: Diagnosis not present

## 2022-05-29 DIAGNOSIS — S72142A Displaced intertrochanteric fracture of left femur, initial encounter for closed fracture: Secondary | ICD-10-CM | POA: Diagnosis not present

## 2022-05-29 LAB — GLUCOSE, CAPILLARY
Glucose-Capillary: 154 mg/dL — ABNORMAL HIGH (ref 70–99)
Glucose-Capillary: 147 mg/dL — ABNORMAL HIGH (ref 70–99)
Glucose-Capillary: 155 mg/dL — ABNORMAL HIGH (ref 70–99)
Glucose-Capillary: 110 mg/dL — ABNORMAL HIGH (ref 70–99)
Glucose-Capillary: 180 mg/dL — ABNORMAL HIGH (ref 70–99)
Glucose-Capillary: 130 mg/dL — ABNORMAL HIGH (ref 70–99)

## 2022-05-29 MED ORDER — ACETAMINOPHEN 500 MG PO TABS
1000.0000 mg | ORAL_TABLET | Freq: Three times a day (TID) | ORAL | Status: DC
  Administered 2022-05-29 – 2022-06-01 (×9): 1000 mg via ORAL
  Filled 2022-05-29 (×9): qty 2

## 2022-05-29 NOTE — Progress Notes (Signed)
Patient has been more somnolent today than yesterday. Patient has been sleeping the majority of the day, did complain of 10 out of 10 head pain. Given pain meds for hip pain and Tylenol. Informed MD of concerns, informed to give Tylenol which had already been given.

## 2022-05-29 NOTE — Progress Notes (Signed)
I was able to get patient to respond to my voice without opening his eyes. I could not get him to open his eyes. He was able to state his name and he was in pain and then he went right back to sleep. I don't feel comfortable giving his PO medications. Notified A. Virgel Manifold, NP. Will continue to monitor.   Patient finally woke up agitated and opened his eyes and starting pulling everything off. He had a BM and we cleaned him up. Also, finally got him to take most of his PO meds crushed in applesauce. He first started spitting them out and then finally took them. Will continue to monitor.

## 2022-05-29 NOTE — Consult Note (Signed)
Consultation Note Date: 05/29/2022   Patient Name: Joe PlattClyde D Murray  DOB: Apr 13, 1940  MRN: [id number]  Age / Sex: 82 y.o., male  PCP: Nelwyn SalisburyFry, Stephen A, MD Referring Physician: Glade LloydAlekh, Kshitiz, MD  Reason for Consultation: Establishing goals of care  HPI/Patient Profile: 82 y.o. male  with past medical history of dementia, Parkinson's, CAD, CKD, and DM admitted on 05/25/2022 after a fall at home resulting in left hip fracture.  Patient underwent surgical repair on 4/3.  Patient with ongoing confusion worse than baseline following surgery.  PMT consulted to discuss goals of care.  Clinical Assessment and Goals of Care: I have reviewed medical records including EPIC notes, labs and imaging, assessed the patient and then met with patient's wife Tyler AasDoris to discuss diagnosis prognosis, GOC, EOL wishes, disposition and options.  I introduced Palliative Medicine as specialized medical care for people living with serious illness. It focuses on providing relief from the symptoms and stress of a serious illness. The goal is to improve quality of life for both the patient and the family.  We discussed a brief life review of the patient.  Wife tells me they have 3 children together.  1 child is local and the other live out of state.  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  As far as functional and nutritional status wife tells me patient was ambulatory at baseline.  He would use a walker at times.  She tells me he could shower independently.  He could feed himself as well.  She tells me he is maintained a good appetite.  She tells me of his cognitive decline; the worst part of it being his hallucinations.  She tells me when he was not having active hallucinations she could have a somewhat normal conversation with him.   We discussed patient's current illness and what it means in the larger context of patient's on-going co-morbidities.  Natural  disease trajectory and expectations at EOL were discussed.  We discussed concerns about patient's mental status.  I attempted to elicit values and goals of care important to the patient.    The difference between aggressive medical intervention and comfort care was considered in light of the patient's goals of care.   Advance directives, concepts specific to code status, artificial feeding and hydration, and rehospitalization were considered and discussed.   I completed a MOST form today. The patient and family outlined their wishes for the following treatment decisions:  Cardiopulmonary Resuscitation: Do Not Attempt Resuscitation (DNR/No CPR)  Medical Interventions: Left Blank  Antibiotics: Antibiotics if indicated  IV Fluids: IV fluids if indicated  Feeding Tube: No feeding tube   We did not complete the MOST form as we left the medical interventions selection blank.  Wife requested time to speak to her daughter about this.    Wife tells me that she and the patient never spoke about his wishes regarding medical care if he were very sick.  She tells me they did complete living wills at some point but it was a very long time ago and she does not remember what was said.  We discussed concept of quality of life.  We discussed that times medical interventions offered do not gain as quality of life we would find acceptable and there may come a time when it would make more sense to focus on comfort instead of pursuing aggressive medical interventions.  She expresses understanding of this.  Discussed with wife the importance of continued conversation  with family and the medical providers regarding overall plan of care and treatment options, ensuring decisions are within the context of the patients values and GOCs.    Hard choices book provided to wife and she shares she will review.  Patient with intermittent grimacing and occasional moaning while sleeping.  Wife expresses concern he is having  some pain postop.  She expresses some concern that he only has as needed meds and he is unable to request them.  We discussed adding scheduled Tylenol.  She is agreeable to this.  Questions and concerns were addressed. The family was encouraged to call with questions or concerns.  Primary Decision Maker NEXT OF KIN  - wife [name]    SUMMARY OF RECOMMENDATIONS   - MOST partially completed: DNR, no feeding tube - wife unsure about medical intervention selection and requested time to consider and speak to her daughter -Will request palliative provider follow-up with wife regarding most completion -Add scheduled Tylenol 1000 mg TID  Code Status/Advance Care Planning: DNR      Primary Diagnoses: Present on Admission:  Closed intertrochanteric fracture of left femur, initial encounter  DM (diabetes mellitus), type 2 with complications - CAD  Parkinson's disease   I have reviewed the medical record, interviewed the patient and family, and examined the patient. The following aspects are pertinent.  Past Medical History:  Diagnosis Date   Alzheimer's dementia    sees Dr. Patrcia DollyKaren Aquino   CAD S/P percutaneous coronary angioplasty 10/'03; 3/'09; 7/*09; 1/'17    a. Dr. Herbie BaltimoreHarding; '03 - Cypher DES 3.0 mm 33 mm proximal-mid LAD (details D1 with ostial 60%); 3/'09 dRCA 2.75 mm x 13 mm Cypher DES (2.8 mm); 7/'09 pRCA 3.0 mm x 18 m Cypher DES (3.25 mm);; (2009) 2D Echo - EF 55%, (November 2014) nonischemic Myoview;; b. Promus DES to RCA and mid LAD 02/26/2015   Chronotropic incompetence 12/2011   ETT 11/2017: Normal blood pressure response.  No EKG changes.  Exercise stopped due to fatigue and dyspnea.  Heart rate increased to low 90s from 50s.  Suggest chronotropic incompetence.  Severely impaired exercise capacity. -->  Results reviewed with electrophysiology: Still not indication for pacemaker.   Diabetes mellitus type 2 in obese    CAD   Diverticulosis    Dyslipidemia, goal LDL below 70    on  [employer] study medicaion.   ED (erectile dysfunction)    GERD (gastroesophageal reflux disease)    Hemorrhoids 07/2002   Internal and External   Insomnia    Nodular basal cell carcinoma (BCC) 05/08/2019   left ear ([id number])   Non-STEMI (non-ST elevated myocardial infarction) 11/2001   a. Proximal LAD tandem lesions -- long DES stent covering both;; b. Jan 2017: mRCA PCI, mLAD PCI   Parkinson's disease    sees Dr. Patrcia DollyKaren Aquino   Persistent sinus bradycardia    Personal history of colonic adenomas 09/06/2012   Psoriasis    Resting HR in 54s   SCC (squamous cell carcinoma) 11/13/2001   right upper outer arm (txpbx)   SCC (squamous cell carcinoma) 11/13/2009   right upper ear rim ([id number])   SCC (squamous cell carcinoma) 01/20/2010   right nose ([id number])   SCC (squamous cell carcinoma) 03/28/2014   right side of nose ( [id number])   SCC (squamous cell carcinoma) 06/03/2016   right side of nose   SCC (squamous cell carcinoma) 05/08/2019   right ear inferior ([id number]) cis   SCCA (squamous cell carcinoma) of skin 04/23/2021  Right Temporal Scalp (well diff) (tx p bx)   Spasm of esophagus    Squamous cell carcinoma in situ (SCCIS) 05/08/2019   Right Ear Inferior   Squamous cell carcinoma of skin 11/08/2001   top right rim ear    SVT (supraventricular tachycardia) 2003   Vertigo, benign positional    Social History   Socioeconomic History   Marital status: Married    Spouse name: Not on file   Number of children: 4   Years of education: Not on file   Highest education level: Not on file  Occupational History   Occupation: retired  Tobacco Use   Smoking status: Every Day    Types: Cigars    Start date: 07/06/1952   Smokeless tobacco: Never   Tobacco comments:    1-2 per day; he says "I really don't inhale"  Vaping Use   Vaping Use: Never used  Substance and Sexual Activity   Alcohol use: Yes    Alcohol/week: 0.0 standard drinks of alcohol    Comment: occ   Drug use: No    Sexual activity: Never  Other Topics Concern   Not on file  Social History Narrative   He is a married, father of 3, grandfather 3.   He still smokes 2-3 cigars per day. States that he "does not really inhale ". He is not really all that it's including, stating that he wants to have his 1 remaining vice.   Exercises only on occasion.   He does various landscaping jobs including cutting wood, and clearing brush.      Pt is right handed   Lives in 2 story home with his wife   High school graduate   Retired Curator   Social Determinants of Health   Financial Resource Strain: Low Risk  (03/30/2022)   Overall Financial Resource Strain (CARDIA)    Difficulty of Paying Living Expenses: Not hard at all  Recent Concern: Financial Resource Strain - Medium Risk (01/06/2022)   Overall Financial Resource Strain (CARDIA)    Difficulty of Paying Living Expenses: Somewhat hard  Food Insecurity: No Food Insecurity (05/26/2022)   Hunger Vital Sign    Worried About Running Out of Food in the Last Year: Never true    Ran Out of Food in the Last Year: Never true  Transportation Needs: No Transportation Needs (05/26/2022)   PRAPARE - Administrator, Civil Service (Medical): No    Lack of Transportation (Non-Medical): No  Physical Activity: Inactive (03/30/2022)   Exercise Vital Sign    Days of Exercise per Week: 0 days    Minutes of Exercise per Session: 0 min  Stress: No Stress Concern Present (03/30/2022)   [employer] of Occupational Health - Occupational Stress Questionnaire    Feeling of Stress : Not at all  Social Connections: Socially Integrated (03/30/2022)   Social Connection and Isolation Panel [NHANES]    Frequency of Communication with Friends and Family: More than three times a week    Frequency of Social Gatherings with Friends and Family: More than three times a week    Attends Religious Services: More than 4 times per year    Active Member of [employer] or Organizations: Yes     Attends Engineer, structural: More than 4 times per year    Marital Status: Married   Family History  Problem Relation Age of Onset   Diabetes Mother    Hearing loss Father    Dementia Father  Heart attack Father 50       first MI prior to age 17   Heart attack Brother    Scheduled Meds:  acetaminophen  1,000 mg Oral TID   amLODipine  2.5 mg Oral Daily   aspirin  81 mg Oral Daily   carbidopa-levodopa  1 tablet Oral TID with meals   enoxaparin (LOVENOX) injection  40 mg Subcutaneous Q24H   feeding supplement  237 mL Oral BID BM   insulin aspart  0-9 Units Subcutaneous Q4H   memantine  10 mg Oral BID   QUEtiapine  50 mg Oral QHS   Continuous Infusions:  lactated ringers 50 mL/hr at 05/29/22 0008   PRN Meds:.HYDROcodone-acetaminophen, HYDROmorphone (DILAUDID) injection, LORazepam, ondansetron (ZOFRAN) IV, senna-docusate Allergies  Allergen Reactions   Beta Adrenergic Blockers Other (See Comments)    chronotropic incompetence   Praluent [Alirocumab]     Diffuse Rash - improved when stopping & taking Benadryl   Sulfonamide Derivatives     unknown   Statins Other (See Comments)    REACTION: myalgias   Review of Systems  Unable to perform ROS: Dementia    Physical Exam Constitutional:      General: He is not in acute distress.    Appearance: He is ill-appearing.     Comments: Sleeping, does not wake to voice or gentle touch  Pulmonary:     Effort: Pulmonary effort is normal.  Skin:    General: Skin is warm and dry.     Vital Signs: BP (!) 113/55 (BP Location: Left Arm)   Pulse (!) 49   Temp 98.4 F (36.9 C) (Oral)   Resp 14   Ht 5\' 8"  (1.727 m)   Wt 98.5 kg   SpO2 100%   BMI 33.02 kg/m  Pain Scale: Faces POSS *See Group Information*: S-Acceptable,Sleep, easy to arouse Pain Score: Asleep   SpO2: SpO2: 100 % O2 Device:SpO2: 100 % O2 Flow Rate: .O2 Flow Rate (L/min): 1 L/min  IO: Intake/output summary:  Intake/Output Summary (Last 24  hours) at 05/29/2022 1403 Last data filed at 05/29/2022 0981 Gross per 24 hour  Intake 1708.82 ml  Output 1200 ml  Net 508.82 ml    LBM: Last BM Date : 05/29/22 Baseline Weight: Weight: 98.5 kg Most recent weight: Weight: 98.5 kg      *Please note that this is a verbal dictation therefore any spelling or grammatical errors are due to the "Dragon Medical One" system interpretation.   Gerlean Ren, DNP, AGNP-C Palliative Medicine Team [phone] Pager: [phone]

## 2022-05-29 NOTE — Progress Notes (Signed)
Physical Therapy Treatment Patient Details Name: Joe Murray MRN: [id number] DOB: 1940/03/16 Today's Date: 05/29/2022   History of Present Illness 82 year old male with history of Parkinson disease, SVT, vertigo, DM,sinus bradycardia, NSTEMI, dementia presented after a fall at home resulting in a left femoral neck fracture.  He underwent Cannulated screw fixation of left valgus impacted femoral neck fracture on 05/27/22    PT Comments    The patient is awake but keeping eyes closed. Less vocal about  car repairs today.  Patient Did  stand in STEDY x 2 and able to bear weight on LLE. Patient appeared to tolerate with premedication for pain. Wife present. Continue PT while in acute care.   Recommendations for follow up therapy are one component of a multi-disciplinary discharge planning process, led by the attending physician.  Recommendations may be updated based on patient status, additional functional criteria and insurance authorization.  Follow Up Recommendations  Can patient physically be transported by private vehicle: No    Assistance Recommended at Discharge Frequent or constant Supervision/Assistance  Patient can return home with the following Two people to help with walking and/or transfers;Two people to help with bathing/dressing/bathroom   Equipment Recommendations  None recommended by PT    Recommendations for Other Services       Precautions / Restrictions Precautions Precautions: Fall Precaution Comments: neds sinemet  and pain meds on board Restrictions Weight Bearing Restrictions: No     Mobility  Bed Mobility Overal bed mobility: Needs Assistance Bed Mobility: Rolling, Supine to Sit, Sit to Supine Rolling: +2 for physical assistance, +2 for safety/equipment, Total assist   Supine to sit: Total assist, +2 for safety/equipment, +2 for physical assistance Sit to supine: Total assist, +2 for physical assistance, +2 for safety/equipment   General bed  mobility comments: assist to roll to the  right  for pericare, assist to reach for rail. purple slide placed under bed pads to facilite  turning in bed and sitting upright, +2 total assist. +2 total assist to return to supine.    Transfers Overall transfer level: Needs assistance   Transfers: Sit to/from Stand Sit to Stand: Max assist, +2 safety/equipment, +2 physical assistance, From elevated surface           General transfer comment: Huntley Dec stedy placed , assisted patient  to reach for horizontal bar. Patient  able to  stand with +2 max to power up with use of bed pad at buttocks, knees flexed but cleared bed surface. Stood x ~ 20 secs. Sat down to rest then mod assist of 2 to stand up, use of bed pad to facilite. Stood more erect x ~ 20 secs.    Ambulation/Gait                   Stairs             Wheelchair Mobility    Modified Rankin (Stroke Patients Only)       Balance Overall balance assessment: Needs assistance, History of Falls Sitting-balance support: Feet supported, No upper extremity supported Sitting balance-Leahy Scale: Fair Sitting balance - Comments: initially listing to right, able to gain static sitting at midline   Standing balance support: Bilateral upper extremity supported, Reliant on assistive device for balance, During functional activity Standing balance-Leahy Scale: Poor Standing balance comment: supported on STEDY horizontal bar  Cognition Arousal/Alertness: Awake/alert   Overall Cognitive Status: Impaired/Different from baseline Area of Impairment: Attention, Following commands                   Current Attention Level: Focused           General Comments: keeps eyes closed more today, less vocal than previous therapy, did not  really follow  directions        Exercises      General Comments        Pertinent Vitals/Pain Pain Assessment Breathing: occasional labored  breathing, short period of hyperventilation Negative Vocalization: occasional moan/groan, low speech, negative/disapproving quality Facial Expression: facial grimacing Body Language: tense, distressed pacing, fidgeting Consolability: distracted or reassured by voice/touch PAINAD Score: 6    Home Living                          Prior Function            PT Goals (current goals can now be found in the care plan section) Progress towards PT goals: Progressing toward goals    Frequency           PT Plan Current plan remains appropriate    Co-evaluation              AM-PAC PT "6 Clicks" Mobility   Outcome Measure  Help needed turning from your back to your side while in a flat bed without using bedrails?: Total Help needed moving from lying on your back to sitting on the side of a flat bed without using bedrails?: Total Help needed moving to and from a bed to a chair (including a wheelchair)?: Total Help needed standing up from a chair using your arms (e.g., wheelchair or bedside chair)?: Total Help needed to walk in hospital room?: Total Help needed climbing 3-5 steps with a railing? : Total 6 Click Score: 6    End of Session Equipment Utilized During Treatment: Gait belt Activity Tolerance: Patient tolerated treatment well Patient left: in bed;with call bell/phone within reach;with family/visitor present Nurse Communication: Mobility status;Need for lift equipment PT Visit Diagnosis: Muscle weakness (generalized) (M62.81);History of falling (Z91.81)     Time: [id number] PT Time Calculation (min) (ACUTE ONLY): 30 min  Charges:  $Therapeutic Activity: 23-37 mins                     Blanchard KelchKaren Miller [facility] Acute Rehabilitation Services Office [phone] Weekend [phone]    Rada HayHill, Tinsleigh Slovacek Elizabeth 05/29/2022, 10:23 AM

## 2022-05-29 NOTE — Progress Notes (Signed)
PROGRESS NOTE    Joe Murray  QHU:[id number] DOB: [birth year] DOA: 05/25/2022 PCP: Nelwyn Salisbury, MD   Brief Narrative:  82 year old male with history of Parkinson disease, dementia presented after a fall at home resulting in a left femoral neck fracture.  He underwent surgical intervention by orthopedics on 05/27/2022.  Assessment & Plan:   Left femoral neck fracture after a fall -underwent surgical intervention by orthopedics on 05/27/2022.  Pain management/wound care/activity/DVT prophylaxis as per orthopedics recommendations. -Fall precautions -PT recommending SNF placement.  TOC consulted.  CKD stage IIIa -Creatinine stable.   Leukocytosis -Possibly reactive.    Diabetes mellitus type 2 with hyperglycemia -Continue CBGs with SSI.  Glipizide on hold.  Hypertension -Monitor blood pressure.  Continue amlodipine  Parkinson's disease Parkinson's dementia with hallucinations, paranoia, agitation, disturbed wake/sleep cycle -Continue Sinemet and memantine -Seroquel started on 05/27/2022 after discussion with patient's daughter.  Continue the same for now. -Continue Ativan as needed for anxiety.   -Palliative care consultation is pending.   -Discussed with wife at bedside today who agrees for CODE STATUS to be changed to DNR.  CAD status post stenting -Most recent PCI in January 2017 with DES to RCA and mid LAD. -Continue aspirin.  Not on beta-blocker due to history of bradycardia. -On Repatha as an outpatient.  Not on statin due to myalgias    DVT prophylaxis: Lovenox Code Status: Change CODE STATUS to DNR Family Communication: wife at bedside Disposition Plan: Status is: Inpatient Remains inpatient appropriate because: Of severity of illness   Consultants: Orthopedics.  palliative care  Procedures: As above Antimicrobials: Perioperative   Subjective: Patient seen and examined at bedside.  Awake but still confused.  No agitation, seizures, vomiting  reported. Objective: Vitals:   05/28/22 0946 05/28/22 1336 05/28/22 2017 05/29/22 0520  BP: (!) 157/80 (!) 148/70 (!) 154/62 (!) 145/78  Pulse: 73 60 (!) 56 (!) 55  Resp: 18 18 18 18   Temp: 98.1 F (36.7 C) 98.6 F (37 C) 98.4 F (36.9 C) 98.4 F (36.9 C)  TempSrc: Oral Axillary Oral Oral  SpO2: 98% 99% 100% 97%  Weight:      Height:        Intake/Output Summary (Last 24 hours) at 05/29/2022 0740 Last data filed at 05/29/2022 4656 Gross per 24 hour  Intake 1948.82 ml  Output 2050 ml  Net -101.18 ml    Filed Weights   05/26/22 1628  Weight: 98.5 kg    Examination:  General: On room air.  No distress.  Elderly male lying in bed.  Looks chronically ill and deconditioned.  Slightly more awake this morning, extremely slow to respond.  Confused. respiratory: Bilateral decreased breath sounds at bases with scattered crackles  CVS: Mild intermittent bradycardia present; S1-S2 RRR heard  abdominal: Soft, nontender, distended mildly; no organomegaly; normal bowel sounds are heard normally extremities: No cyanosis; mild lower extremity edema present    Data Reviewed: I have personally reviewed following labs and imaging studies  CBC: Recent Labs  Lab 05/25/22 1827 05/26/22 0147 05/28/22 0816  WBC 14.3* 12.5* 12.2*  NEUTROABS 12.4*  --   --   HGB 15.1 13.4 14.0  HCT 45.4 40.0 42.2  MCV 95.6 95.0 95.7  PLT 185 163 153    Basic Metabolic Panel: Recent Labs  Lab 05/25/22 1827 05/26/22 0147 05/28/22 0816  NA 139 136 136  K 4.0 3.6 3.7  CL 105 104 101  CO2 22 23 25   GLUCOSE 179* 209* 119*  BUN  22 20 22   CREATININE 1.35* 1.24 1.19  CALCIUM 9.1 8.8* 8.5*    GFR: Estimated Creatinine Clearance: 54.4 mL/min (by C-G formula based on SCr of 1.19 mg/dL). Liver Function Tests: Recent Labs  Lab 05/25/22 1827  AST 26  ALT 20  ALKPHOS 62  BILITOT 0.5  PROT 7.4  ALBUMIN 4.3    No results for input(s): "LIPASE", "AMYLASE" in the last 168 hours. No results for  input(s): "AMMONIA" in the last 168 hours. Coagulation Profile: No results for input(s): "INR", "PROTIME" in the last 168 hours. Cardiac Enzymes: No results for input(s): "CKTOTAL", "CKMB", "CKMBINDEX", "TROPONINI" in the last 168 hours. BNP (last 3 results) No results for input(s): "PROBNP" in the last 8760 hours. HbA1C: No results for input(s): "HGBA1C" in the last 72 hours. CBG: Recent Labs  Lab 05/28/22 1137 05/28/22 1612 05/28/22 2008 05/28/22 2306 05/29/22 0318  GLUCAP 138* 152* 202* 120* 154*    Lipid Profile: No results for input(s): "CHOL", "HDL", "LDLCALC", "TRIG", "CHOLHDL", "LDLDIRECT" in the last 72 hours. Thyroid Function Tests: No results for input(s): "TSH", "T4TOTAL", "FREET4", "T3FREE", "THYROIDAB" in the last 72 hours. Anemia Panel: No results for input(s): "VITAMINB12", "FOLATE", "FERRITIN", "TIBC", "IRON", "RETICCTPCT" in the last 72 hours. Sepsis Labs: No results for input(s): "PROCALCITON", "LATICACIDVEN" in the last 168 hours.  Recent Results (from the past 240 hour(s))  Surgical PCR screen     Status: None   Collection Time: 05/26/22  2:22 PM   Specimen: Nasal Mucosa; Nasal Swab  Result Value Ref Range Status   MRSA, PCR NEGATIVE NEGATIVE Final   Staphylococcus aureus NEGATIVE NEGATIVE Final    Comment: (NOTE) The Xpert SA Assay (FDA approved for NASAL specimens in patients 83 years of age and older), is one component of a comprehensive surveillance program. It is not intended to diagnose infection nor to guide or monitor treatment. Performed at [facility], [address] [place], [place] [address]          Radiology Studies: DG HIP UNILAT W OR W/O PELVIS 2-3 VIEWS LEFT  Result Date: 05/27/2022 CLINICAL DATA:  Insert is EXAM: DG HIP (WITH OR WITHOUT PELVIS) 2-3V LEFT COMPARISON:  None Available. FINDINGS: Postsurgical changes of left femoral neck fixation. Hardware is intact without evidence of immediate complication.  Expected soft tissue changes. IMPRESSION: Postsurgical changes of left femoral neck fixation. Hardware is intact without evidence of immediate complication. Electronically Signed   By: Caprice RenshawJacob  Kahn M.D.   On: 05/27/2022 16:40   DG HIP UNILAT WITH PELVIS 1V LEFT  Result Date: 05/27/2022 CLINICAL DATA:  [id number] Surgery, elective [id number] EXAM: DG HIP (WITH OR WITHOUT PELVIS) 1V*L* COMPARISON:  CT 05/25/2022 FINDINGS: Intraoperative images during left femoral neck fixation with percutaneous screws for subcapital femoral neck fracture. IMPRESSION: Intraoperative images during left femoral neck fixation with percutaneous screws. Electronically Signed   By: Caprice RenshawJacob  Kahn M.D.   On: 05/27/2022 16:39   DG C-Arm 1-60 Min-No Report  Result Date: 05/27/2022 Fluoroscopy was utilized by the requesting physician.  No radiographic interpretation.        Scheduled Meds:  amLODipine  2.5 mg Oral Daily   aspirin  81 mg Oral Daily   carbidopa-levodopa  1 tablet Oral TID with meals   enoxaparin (LOVENOX) injection  40 mg Subcutaneous Q24H   feeding supplement  237 mL Oral BID BM   insulin aspart  0-9 Units Subcutaneous Q4H   memantine  10 mg Oral BID   QUEtiapine  50 mg  Oral QHS   Continuous Infusions:  lactated ringers 50 mL/hr at 05/29/22 0008          Glade LloydKshitiz Burgundy Matuszak, MD [facility] 05/29/2022, 7:40 AM

## 2022-05-30 DIAGNOSIS — S72002A Fracture of unspecified part of neck of left femur, initial encounter for closed fracture: Secondary | ICD-10-CM

## 2022-05-30 DIAGNOSIS — Z66 Do not resuscitate: Secondary | ICD-10-CM | POA: Diagnosis not present

## 2022-05-30 DIAGNOSIS — Z515 Encounter for palliative care: Secondary | ICD-10-CM | POA: Diagnosis not present

## 2022-05-30 DIAGNOSIS — S72142A Displaced intertrochanteric fracture of left femur, initial encounter for closed fracture: Secondary | ICD-10-CM | POA: Diagnosis not present

## 2022-05-30 DIAGNOSIS — Z7189 Other specified counseling: Secondary | ICD-10-CM | POA: Diagnosis not present

## 2022-05-30 LAB — GLUCOSE, CAPILLARY
Glucose-Capillary: 128 mg/dL — ABNORMAL HIGH (ref 70–99)
Glucose-Capillary: 134 mg/dL — ABNORMAL HIGH (ref 70–99)
Glucose-Capillary: 141 mg/dL — ABNORMAL HIGH (ref 70–99)
Glucose-Capillary: 195 mg/dL — ABNORMAL HIGH (ref 70–99)
Glucose-Capillary: 197 mg/dL — ABNORMAL HIGH (ref 70–99)
Glucose-Capillary: 202 mg/dL — ABNORMAL HIGH (ref 70–99)

## 2022-05-30 MED ORDER — BISACODYL 10 MG RE SUPP: 10.0000 mg | Freq: Every day | RECTAL | Status: DC | PRN

## 2022-05-30 MED ORDER — POLYETHYLENE GLYCOL 3350 17 G PO PACK: 17.0000 g | PACK | Freq: Every day | ORAL | Status: DC | PRN

## 2022-05-30 MED ORDER — METHOCARBAMOL 500 MG PO TABS
500.0000 mg | ORAL_TABLET | Freq: Four times a day (QID) | ORAL | Status: DC | PRN
  Administered 2022-05-30 – 2022-06-01 (×2): 500 mg via ORAL
  Filled 2022-05-30 (×2): qty 1

## 2022-05-30 MED ORDER — SENNOSIDES-DOCUSATE SODIUM 8.6-50 MG PO TABS
1.0000 | ORAL_TABLET | Freq: Two times a day (BID) | ORAL | Status: DC
  Administered 2022-05-30 – 2022-05-31 (×2): 1 via ORAL
  Filled 2022-05-30 (×4): qty 1

## 2022-05-30 NOTE — TOC Progression Note (Addendum)
Transition of Care [facility]) - Progression Note    Patient Details  Name: Joe Murray MRN: [id number] Date of Birth: [birth year]  Transition of Care [facility]) CM/SW Contact  Georgie Chard, [place] Phone Number: 05/30/2022, 12:41 PM  Clinical Narrative:    Addend @ 1:57  CSW has reached back out to the patient's wife at this time [facility] has not responded this CSW did explain the bed offer process in the terms of her husband being medically ready for DC. At this time the patient did choose [facility] as this CSW did explain if the family wants and then there are no bed offers available as the family was very reluctant to pick from the three that were presented. The wife also inquired about if [facility] did approve would they be able to switch. At this time the CSW is unsure of what [facility] will say so TOC will make that decision if that is an option come Monday. The patient is ready for DC at this time, the wait or risk of losing a bed would not be a benefit to the patient or the family at this time CSW did reach out to [facility] [place] with central intake to start the patient's AUTH once approved the patient can DC to the facility. TOC will continue to follow.     CSW has reached out to the patient's wife at this time the patient does not like the bed choices, CSW has explained to the wife that 7 bed offers have been denied and there were 2 pending at this current time. CSW has also explained to the wife that the three that accepted the patient she would need to pick a bed, CSW explained to wife that the concern was that we would not want the family patient to lose a bed offer. This CSW has reached out to [place] [facility] awaiting a response as the patient's wife would like that facility as an option. TOC to follow.    Expected Discharge Plan: Skilled Nursing Facility Barriers to Discharge: Continued Medical Work up, English as a second language teacher, SNF Pending bed offer, Awaiting State Approval  Financial controller)  Expected Discharge Plan and Services In-house Referral: Clinical Social Work   Post Acute Care Choice: Skilled Nursing Facility Living arrangements for the past 2 months: Single Family Home                                       Social Determinants of Health (SDOH) Interventions SDOH Screenings   Food Insecurity: No Food Insecurity (05/26/2022)  Housing: Low Risk  (05/26/2022)  Transportation Needs: No Transportation Needs (05/26/2022)  Utilities: Not At Risk (05/26/2022)  Alcohol Screen: Low Risk  (03/30/2022)  Depression (PHQ2-9): Low Risk  (03/30/2022)  Financial Resource Strain: Low Risk  (03/30/2022)  Recent Concern: Financial Resource Strain - Medium Risk (01/06/2022)  Physical Activity: Inactive (03/30/2022)  Social Connections: Socially Integrated (03/30/2022)  Stress: No Stress Concern Present (03/30/2022)  Tobacco Use: High Risk (05/28/2022)    Readmission Risk Interventions     No data to display

## 2022-05-30 NOTE — Progress Notes (Signed)
Physical Therapy Treatment Patient Details Name: Joe Murray MRN: [id number] DOB: November 20, 1940 Today's Date: 05/30/2022   History of Present Illness 82 year old male with history of Parkinson disease, SVT, vertigo, DM,sinus bradycardia, NSTEMI, dementia presented after a fall at home resulting in a left femoral neck fracture.  He underwent Cannulated screw fixation of left valgus impacted femoral neck fracture on 05/27/22    PT Comments    Pt assisted with sitting EOB and performed heel raises and toe raises in sitting.  Utilized sara stedy equipment to safely assist pt with standing.  Pt performed standing x2 with hold until fatigued.  Pt assisted back to bed and repositioned to comfort end of session.     Recommendations for follow up therapy are one component of a multi-disciplinary discharge planning process, led by the attending physician.  Recommendations may be updated based on patient status, additional functional criteria and insurance authorization.  Follow Up Recommendations  Can patient physically be transported by private vehicle: No    Assistance Recommended at Discharge Frequent or constant Supervision/Assistance  Patient can return home with the following Two people to help with walking and/or transfers;Two people to help with bathing/dressing/bathroom   Equipment Recommendations  None recommended by PT    Recommendations for Other Services       Precautions / Restrictions Precautions Precautions: Fall Restrictions Weight Bearing Restrictions: No Other Position/Activity Restrictions: WBAT     Mobility  Bed Mobility Overal bed mobility: Needs Assistance Bed Mobility: Supine to Sit, Sit to Supine     Supine to sit: Total assist, +2 for safety/equipment, +2 for physical assistance Sit to supine: Total assist, +2 for physical assistance, +2 for safety/equipment        Transfers Overall transfer level: Needs assistance Equipment used: Rolling walker (2  wheels) Transfers: Sit to/from Stand Sit to Stand: +2 safety/equipment, +2 physical assistance, From elevated surface, Min assist           General transfer comment: [place] stedy equipment utilized for safety, pt assisted with UE and LE positioning, pt able to stand in stedy with min assist however required elevated surface and cues for rise and weight shifting, required increased time; performed twice; able to hold stand for approx 3 minutes (for pericare) and then 1 minute with second stand    Ambulation/Gait                   Stairs             Wheelchair Mobility    Modified Rankin (Stroke Patients Only)       Balance Overall balance assessment: Needs assistance, History of Falls         Standing balance support: Bilateral upper extremity supported, Reliant on assistive device for balance, During functional activity Standing balance-Leahy Scale: Poor                              Cognition Arousal/Alertness: Awake/alert Behavior During Therapy: Flat affect Overall Cognitive Status: History of cognitive impairments - at baseline                         Following Commands: Follows one step commands inconsistently       General Comments: requires multimodal cues, hx dementia        Exercises      General Comments        Pertinent Vitals/Pain Pain Assessment Pain Assessment:  PAINAD Breathing: normal Negative Vocalization: occasional moan/groan, low speech, negative/disapproving quality Facial Expression: facial grimacing Body Language: tense, distressed pacing, fidgeting Consolability: distracted or reassured by voice/touch PAINAD Score: 5 Pain Location: left hip, headache Pain Descriptors / Indicators: Headache, Sore Pain Intervention(s): Monitored during session, Repositioned, Patient requesting pain meds-RN notified    Home Living                          Prior Function            PT Goals  (current goals can now be found in the care plan section) Progress towards PT goals: Progressing toward goals    Frequency    Min 2X/week      PT Plan Current plan remains appropriate    Co-evaluation              AM-PAC PT "6 Clicks" Mobility   Outcome Measure  Help needed turning from your back to your side while in a flat bed without using bedrails?: Total Help needed moving from lying on your back to sitting on the side of a flat bed without using bedrails?: Total Help needed moving to and from a bed to a chair (including a wheelchair)?: Total Help needed standing up from a chair using your arms (e.g., wheelchair or bedside chair)?: Total Help needed to walk in hospital room?: Total Help needed climbing 3-5 steps with a railing? : Total 6 Click Score: 6    End of Session Equipment Utilized During Treatment: Gait belt Activity Tolerance: Patient tolerated treatment well Patient left: in bed;with call bell/phone within reach;with bed alarm set;with family/visitor present Nurse Communication: Mobility status PT Visit Diagnosis: Muscle weakness (generalized) (M62.81);History of falling (Z91.81)     Time: 9753-0051 PT Time Calculation (min) (ACUTE ONLY): 27 min  Charges:  $Therapeutic Activity: 23-37 mins                    Paulino Door, DPT Physical Therapist Acute Rehabilitation Services Office: [phone]    Joe Murray 05/30/2022, 3:40 PM

## 2022-05-30 NOTE — Progress Notes (Signed)
Daily Progress Note   Patient Name: Joe Murray       Date: 05/30/2022 DOB: 05/06/1940  Age: 82 y.o. MRN#: [id number] Attending Physician: Glade Lloyd, MD Primary Care Physician: Nelwyn Salisbury, MD Admit Date: 05/25/2022 Length of Stay: 5 days  Reason for Consultation/Follow-up: Establishing goals of care  HPI/Patient Profile:  82 y.o. male  with past medical history of dementia, Parkinson's, CAD, CKD, and DM admitted on 05/25/2022 after a fall at home resulting in left hip fracture.  Patient underwent surgical repair on 4/3.  Patient with ongoing confusion worse than baseline following surgery.  PMT consulted to discuss goals of care.   Subjective:   Subjective: Chart Reviewed. Updates received. Patient Assessed. Created space and opportunity for patient  and family to explore thoughts and feelings regarding current medical situation.  Today's Discussion: Today saw the patient at bedside, although he was sleeping and I elected not to wake him.  Also present at the bedside was his wife Tyler Aas.  I introduced myself is following up from palliative care.  We had a good substantial discussion.  Overall she feels with scheduled Tylenol and recently received opioid pain medication that his pain is better right now.  I agree that he also looks much more comfortable than was described yesterday.  We discussed the MOST form and she states that she was able to discuss this with her daughter and they have elected to request no intubation in the case of medical decline.  I assisted her with completing the MOST form and a copy was scanned into ACP/Vynca.  Hard copy was placed in the chart.  Summary of elections as per below.  We also had a good discussion about the book "hard choices for loving people" which she states that she has been reading and is found quite interesting and informative.  She discusses her sister who recently lost her husband and both children within the same 2 years.  We talked about  the unpredictability of life and the importance of having her wishes known.  I encouraged her to discuss what her wishes would be with her family should anything happen so that they can simply be her voice rather than "making decisions for her".  She agrees that this would be beneficial as she sees how not having this discussion with her husband has made things more difficult for her currently.  I recommended some time for outcomes to see how he does.  I told her that I would check on the patient Monday if he is still admitted.  In the interim she can call with any questions or concerns and a colleague could see him before then if needed.  I provided emotional and general support through therapeutic listening, empathy, sharing of stories, therapeutic touch, and other techniques. I answered all questions and addressed all concerns to the best of my ability.   I completed a MOST form today. The patient and family outlined their wishes for the following treatment decisions:  Cardiopulmonary Resuscitation: Do Not Attempt Resuscitation (DNR/No CPR)  Medical Interventions: Limited Additional Interventions: Use medical treatment, IV fluids and cardiac monitoring as indicated, DO NOT USE intubation or mechanical ventilation. May consider use of less invasive airway support such as BiPAP or CPAP. Also provide comfort measures. Transfer to the hospital if indicated. Avoid intensive care.   Antibiotics: Antibiotics if indicated  IV Fluids: IV fluids if indicated  Feeding Tube: No feeding tube     Review of Systems  Unable to  perform ROS (Patient sleeping, elected to not wake him)  Objective:   Vital Signs:  BP 129/68 (BP Location: Left Arm)   Pulse 62   Temp 98.4 F (36.9 C) (Axillary)   Resp 16   Ht 5\' 8"  (1.727 m)   Wt 98.5 kg   SpO2 94%   BMI 33.02 kg/m   Physical Exam: Physical Exam Vitals and nursing note reviewed.  Constitutional:      General: He is sleeping. He is not in acute  distress. HENT:     Head: Normocephalic and atraumatic.  Cardiovascular:     Rate and Rhythm: Normal rate.  Pulmonary:     Effort: Pulmonary effort is normal. No respiratory distress.  Abdominal:     General: Abdomen is flat.  Skin:    General: Skin is warm and dry.     Palliative Assessment/Data: 30%    Existing Vynca/ACP Documentation: MOST form signed/07/2022  Assessment & Plan:   Impression: Present on Admission:  Closed intertrochanteric fracture of left femur, initial encounter  DM (diabetes mellitus), type 2 with complications - CAD  Parkinson's disease  SUMMARY OF RECOMMENDATIONS   DNR Continue current scope of care MOST form elections as per above Time for outcomes PMT will follow-up Monday, 06/01/2022 Please notify us of any significant clinical change or new palliative needs before then  Symptom Management:  Per primary team PMT is available to assist as needed  Code Status: DNR  Prognosis: Unable to determine  Discharge Planning: To Be Determined  Discussed with: Patient's family, medical team, nursing team  Thank you for allowing us to participate in the care of Leane PlattClyde D Preyer PMT will continue to support holistically.  Time Total: 60 min  Visit consisted of counseling and education dealing with the complex and emotionally intense issues of symptom management and palliative care in the setting of serious and potentially life-threatening illness. Greater than 50%  of this time was spent counseling and coordinating care related to the above assessment and plan.  Wynne DustEric Lanee Chain, NP Palliative Medicine Team  Team Phone # [phone] (Nights/Weekends)  10/22/2020, 8:17 AM

## 2022-05-30 NOTE — Progress Notes (Signed)
PROGRESS NOTE    Joe Murray  DQQ:[id number] DOB: [birth year] DOA: 05/25/2022 PCP: Nelwyn Salisbury, MD   Brief Narrative:  82 year old male with history of Parkinson disease, dementia presented after a fall at home resulting in a left femoral neck fracture.  He underwent surgical intervention by orthopedics on 05/27/2022.  Assessment & Plan:   Left femoral neck fracture after a fall -underwent surgical intervention by orthopedics on 05/27/2022.  Pain management/wound care/activity/DVT prophylaxis as per orthopedics recommendations. -Fall precautions -PT recommending SNF placement.  TOC following  CKD stage IIIa -Creatinine stable.   Leukocytosis -Possibly reactive.    Diabetes mellitus type 2 with hyperglycemia -Continue CBGs with SSI.  Glipizide on hold.  Hypertension -Monitor blood pressure.  Continue amlodipine  Parkinson's disease Parkinson's dementia with hallucinations, paranoia, agitation, disturbed wake/sleep cycle -Continue Sinemet and memantine -Seroquel started on 05/27/2022 after discussion with patient's daughter.  Continue the same for now. -Continue Ativan as needed for anxiety.   -Palliative care evaluation appreciated.  Will need outpatient palliative care follow-up.  CAD status post stenting -Most recent PCI in January 2017 with DES to RCA and mid LAD. -Continue aspirin.  Not on beta-blocker due to history of bradycardia. -On Repatha as an outpatient.  Not on statin due to myalgias    DVT prophylaxis: Lovenox Code Status: DNR Family Communication: wife at bedside Disposition Plan: Status is: Inpatient Remains inpatient appropriate because: Of severity of illness   Consultants: Orthopedics.  palliative care  Procedures: As above Antimicrobials: Perioperative   Subjective: Patient seen and examined at bedside.  No vomiting, seizures, agitation reported.  Complains of neck pain.   Objective: Vitals:   05/29/22 1259 05/29/22 2029 05/30/22 0616  05/30/22 0617  BP: (!) 113/55 132/61 129/68   Pulse: (!) 49 (!) 51 62   Resp: 14 16 16    Temp:    98.4 F (36.9 C)  TempSrc:    Axillary  SpO2: 100% 98% 94%   Weight:      Height:        Intake/Output Summary (Last 24 hours) at 05/30/2022 0757 Last data filed at 05/30/2022 1941 Gross per 24 hour  Intake 0 ml  Output 1550 ml  Net -1550 ml    Filed Weights   05/26/22 1628  Weight: 98.5 kg    Examination:  General: No acute distress.  Still on room air.  Elderly male lying in bed.  Looks chronically ill and deconditioned.  Wakes up slightly, answers only a very few questions, confused.  Slow to respond. Respiratory: Decreased breath sounds at bases bilaterally with some crackles  CVS: S1 and S2 are heard; rate mostly controlled  abdominal: Soft, nontender, slightly distended; no organomegaly; bowel sounds heard  extremities: Trace lower extremity edema present; no clubbing   Data Reviewed: I have personally reviewed following labs and imaging studies  CBC: Recent Labs  Lab 05/25/22 1827 05/26/22 0147 05/28/22 0816  WBC 14.3* 12.5* 12.2*  NEUTROABS 12.4*  --   --   HGB 15.1 13.4 14.0  HCT 45.4 40.0 42.2  MCV 95.6 95.0 95.7  PLT 185 163 153    Basic Metabolic Panel: Recent Labs  Lab 05/25/22 1827 05/26/22 0147 05/28/22 0816  NA 139 136 136  K 4.0 3.6 3.7  CL 105 104 101  CO2 22 23 25   GLUCOSE 179* 209* 119*  BUN 22 20 22   CREATININE 1.35* 1.24 1.19  CALCIUM 9.1 8.8* 8.5*    GFR: Estimated Creatinine Clearance: 54.4 mL/min (by  C-G formula based on SCr of 1.19 mg/dL). Liver Function Tests: Recent Labs  Lab 05/25/22 1827  AST 26  ALT 20  ALKPHOS 62  BILITOT 0.5  PROT 7.4  ALBUMIN 4.3    No results for input(s): "LIPASE", "AMYLASE" in the last 168 hours. No results for input(s): "AMMONIA" in the last 168 hours. Coagulation Profile: No results for input(s): "INR", "PROTIME" in the last 168 hours. Cardiac Enzymes: No results for input(s):  "CKTOTAL", "CKMB", "CKMBINDEX", "TROPONINI" in the last 168 hours. BNP (last 3 results) No results for input(s): "PROBNP" in the last 8760 hours. HbA1C: No results for input(s): "HGBA1C" in the last 72 hours. CBG: Recent Labs  Lab 05/29/22 1617 05/29/22 2017 05/29/22 2322 05/30/22 0415 05/30/22 0737  GLUCAP 110* 180* 130* 134* 128*    Lipid Profile: No results for input(s): "CHOL", "HDL", "LDLCALC", "TRIG", "CHOLHDL", "LDLDIRECT" in the last 72 hours. Thyroid Function Tests: No results for input(s): "TSH", "T4TOTAL", "FREET4", "T3FREE", "THYROIDAB" in the last 72 hours. Anemia Panel: No results for input(s): "VITAMINB12", "FOLATE", "FERRITIN", "TIBC", "IRON", "RETICCTPCT" in the last 72 hours. Sepsis Labs: No results for input(s): "PROCALCITON", "LATICACIDVEN" in the last 168 hours.  Recent Results (from the past 240 hour(s))  Surgical PCR screen     Status: None   Collection Time: 05/26/22  2:22 PM   Specimen: Nasal Mucosa; Nasal Swab  Result Value Ref Range Status   MRSA, PCR NEGATIVE NEGATIVE Final   Staphylococcus aureus NEGATIVE NEGATIVE Final    Comment: (NOTE) The Xpert SA Assay (FDA approved for NASAL specimens in patients 83 years of age and older), is one component of a comprehensive surveillance program. It is not intended to diagnose infection nor to guide or monitor treatment. Performed at [facility], [address] [place], [place] [id number]          Radiology Studies: No results found.      Scheduled Meds:  acetaminophen  1,000 mg Oral TID   amLODipine  2.5 mg Oral Daily   aspirin  81 mg Oral Daily   carbidopa-levodopa  1 tablet Oral TID with meals   enoxaparin (LOVENOX) injection  40 mg Subcutaneous Q24H   feeding supplement  237 mL Oral BID BM   insulin aspart  0-9 Units Subcutaneous Q4H   memantine  10 mg Oral BID   QUEtiapine  50 mg Oral QHS   Continuous Infusions:  lactated ringers 50 mL/hr at 05/29/22 0008           Glade LloydKshitiz Fatima Fedie, MD [facility] 05/30/2022, 7:57 AM

## 2022-05-31 DIAGNOSIS — S72142A Displaced intertrochanteric fracture of left femur, initial encounter for closed fracture: Secondary | ICD-10-CM | POA: Diagnosis not present

## 2022-05-31 LAB — GLUCOSE, CAPILLARY
Glucose-Capillary: 165 mg/dL — ABNORMAL HIGH (ref 70–99)
Glucose-Capillary: 162 mg/dL — ABNORMAL HIGH (ref 70–99)
Glucose-Capillary: 203 mg/dL — ABNORMAL HIGH (ref 70–99)
Glucose-Capillary: 142 mg/dL — ABNORMAL HIGH (ref 70–99)
Glucose-Capillary: 214 mg/dL — ABNORMAL HIGH (ref 70–99)

## 2022-05-31 NOTE — Progress Notes (Signed)
PROGRESS NOTE    Joe Murray  TRV:[id number] DOB: 1940/05/02 DOA: 05/25/2022 PCP: Nelwyn Salisbury, MD   Brief Narrative:  82 year old male with history of Parkinson disease, dementia presented after a fall at home resulting in a left femoral neck fracture.  He underwent surgical intervention by orthopedics on 05/27/2022.  Assessment & Plan:   Left femoral neck fracture after a fall -underwent surgical intervention by orthopedics on 05/27/2022.  Pain management/wound care/activity/DVT prophylaxis as per orthopedics recommendations. -Fall precautions -PT recommending SNF placement.  TOC following  CKD stage IIIa -Creatinine stable.   Leukocytosis -Possibly reactive.  No labs today.  Diabetes mellitus type 2 with hyperglycemia -Continue CBGs with SSI.  Glipizide on hold.  Hypertension -Monitor blood pressure.  Continue amlodipine  Parkinson's disease Parkinson's dementia with hallucinations, paranoia, agitation, disturbed wake/sleep cycle -Continue Sinemet and memantine -Seroquel started on 05/27/2022 after discussion with patient's daughter.  Continue the same for now. -Continue Ativan as needed for anxiety.   -Palliative care evaluation and follow-up appreciated.  Will need outpatient palliative care follow-up.  CAD status post stenting -Most recent PCI in January 2017 with DES to RCA and mid LAD. -Continue aspirin.  Not on beta-blocker due to history of bradycardia. -On Repatha as an outpatient.  Not on statin due to myalgias    DVT prophylaxis: Lovenox Code Status: DNR Family Communication: wife at bedside on 05/30/22 Disposition Plan: Status is: Inpatient Remains inpatient appropriate because: Of severity of illness   Consultants: Orthopedics.  palliative care  Procedures: As above Antimicrobials: Perioperative   Subjective: Patient seen and examined at bedside.  No agitation, seizures, fever or vomiting reported.  Oral intake is not that good.    Objective: Vitals:   05/30/22 1341 05/30/22 1535 05/30/22 2100 05/31/22 0553  BP: (!) 112/46 (!) 119/57 (!) 146/60 (!) 148/76  Pulse: 65 (!) 57 (!) 51 (!) 54  Resp:   18 18  Temp: 97.7 F (36.5 C)  97.8 F (36.6 C) 98 F (36.7 C)  TempSrc: Oral  Oral Oral  SpO2: 96%  98% 96%  Weight:      Height:        Intake/Output Summary (Last 24 hours) at 05/31/2022 0755 Last data filed at 05/31/2022 0618 Gross per 24 hour  Intake 460 ml  Output 2050 ml  Net -1590 ml    Filed Weights   05/26/22 1628  Weight: 98.5 kg    Examination:  General: On room air currently.  No distress.  Elderly male lying in bed.  Looks chronically ill and deconditioned.  Wakes up slightly, answers only a very few questions, confused.  Still extremely slow to respond. Respiratory: Bilateral decreased breath sounds at bases with scattered crackles  CVS: Mild intermittent bradycardia present; S1 and S2 heard abdominal: Soft, nontender, mildly distended; no organomegaly; normal bowel sounds. extremities: No cyanosis; mild lower extremity edema present bilaterally  Data Reviewed: I have personally reviewed following labs and imaging studies  CBC: Recent Labs  Lab 05/25/22 1827 05/26/22 0147 05/28/22 0816  WBC 14.3* 12.5* 12.2*  NEUTROABS 12.4*  --   --   HGB 15.1 13.4 14.0  HCT 45.4 40.0 42.2  MCV 95.6 95.0 95.7  PLT 185 163 153    Basic Metabolic Panel: Recent Labs  Lab 05/25/22 1827 05/26/22 0147 05/28/22 0816  NA 139 136 136  K 4.0 3.6 3.7  CL 105 104 101  CO2 22 23 25   GLUCOSE 179* 209* 119*  BUN 22 20 22  CREATININE 1.35* 1.24 1.19  CALCIUM 9.1 8.8* 8.5*    GFR: Estimated Creatinine Clearance: 54.4 mL/min (by C-G formula based on SCr of 1.19 mg/dL). Liver Function Tests: Recent Labs  Lab 05/25/22 1827  AST 26  ALT 20  ALKPHOS 62  BILITOT 0.5  PROT 7.4  ALBUMIN 4.3    No results for input(s): "LIPASE", "AMYLASE" in the last 168 hours. No results for input(s): "AMMONIA" in  the last 168 hours. Coagulation Profile: No results for input(s): "INR", "PROTIME" in the last 168 hours. Cardiac Enzymes: No results for input(s): "CKTOTAL", "CKMB", "CKMBINDEX", "TROPONINI" in the last 168 hours. BNP (last 3 results) No results for input(s): "PROBNP" in the last 8760 hours. HbA1C: No results for input(s): "HGBA1C" in the last 72 hours. CBG: Recent Labs  Lab 05/30/22 1608 05/30/22 2033 05/30/22 2333 05/31/22 0402 05/31/22 0752  GLUCAP 197* 195* 202* 165* 162*    Lipid Profile: No results for input(s): "CHOL", "HDL", "LDLCALC", "TRIG", "CHOLHDL", "LDLDIRECT" in the last 72 hours. Thyroid Function Tests: No results for input(s): "TSH", "T4TOTAL", "FREET4", "T3FREE", "THYROIDAB" in the last 72 hours. Anemia Panel: No results for input(s): "VITAMINB12", "FOLATE", "FERRITIN", "TIBC", "IRON", "RETICCTPCT" in the last 72 hours. Sepsis Labs: No results for input(s): "PROCALCITON", "LATICACIDVEN" in the last 168 hours.  Recent Results (from the past 240 hour(s))  Surgical PCR screen     Status: None   Collection Time: 05/26/22  2:22 PM   Specimen: Nasal Mucosa; Nasal Swab  Result Value Ref Range Status   MRSA, PCR NEGATIVE NEGATIVE Final   Staphylococcus aureus NEGATIVE NEGATIVE Final    Comment: (NOTE) The Xpert SA Assay (FDA approved for NASAL specimens in patients 28 years of age and older), is one component of a comprehensive surveillance program. It is not intended to diagnose infection nor to guide or monitor treatment. Performed at [facility], [address] [place], [place] [address]          Radiology Studies: No results found.      Scheduled Meds:  acetaminophen  1,000 mg Oral TID   amLODipine  2.5 mg Oral Daily   aspirin  81 mg Oral Daily   carbidopa-levodopa  1 tablet Oral TID with meals   enoxaparin (LOVENOX) injection  40 mg Subcutaneous Q24H   feeding supplement  237 mL Oral BID BM   insulin aspart  0-9  Units Subcutaneous Q4H   memantine  10 mg Oral BID   QUEtiapine  50 mg Oral QHS   senna-docusate  1 tablet Oral BID   Continuous Infusions:          Glade Lloyd, MD [facility] 05/31/2022, 7:55 AM

## 2022-06-01 ENCOUNTER — Telehealth: Payer: Self-pay | Admitting: Family Medicine

## 2022-06-01 ENCOUNTER — Telehealth: Payer: Self-pay

## 2022-06-01 ENCOUNTER — Other Ambulatory Visit: Payer: Self-pay

## 2022-06-01 DIAGNOSIS — M6281 Muscle weakness (generalized): Secondary | ICD-10-CM | POA: Diagnosis not present

## 2022-06-01 DIAGNOSIS — M25552 Pain in left hip: Secondary | ICD-10-CM | POA: Diagnosis not present

## 2022-06-01 DIAGNOSIS — R4189 Other symptoms and signs involving cognitive functions and awareness: Secondary | ICD-10-CM | POA: Diagnosis not present

## 2022-06-01 DIAGNOSIS — E118 Type 2 diabetes mellitus with unspecified complications: Secondary | ICD-10-CM | POA: Diagnosis not present

## 2022-06-01 DIAGNOSIS — I10 Essential (primary) hypertension: Secondary | ICD-10-CM | POA: Diagnosis not present

## 2022-06-01 DIAGNOSIS — E785 Hyperlipidemia, unspecified: Secondary | ICD-10-CM | POA: Diagnosis not present

## 2022-06-01 DIAGNOSIS — Z743 Need for continuous supervision: Secondary | ICD-10-CM | POA: Diagnosis not present

## 2022-06-01 DIAGNOSIS — R4182 Altered mental status, unspecified: Secondary | ICD-10-CM | POA: Diagnosis not present

## 2022-06-01 DIAGNOSIS — S72142D Displaced intertrochanteric fracture of left femur, subsequent encounter for closed fracture with routine healing: Secondary | ICD-10-CM | POA: Diagnosis not present

## 2022-06-01 DIAGNOSIS — N390 Urinary tract infection, site not specified: Secondary | ICD-10-CM | POA: Diagnosis not present

## 2022-06-01 DIAGNOSIS — E119 Type 2 diabetes mellitus without complications: Secondary | ICD-10-CM | POA: Diagnosis not present

## 2022-06-01 DIAGNOSIS — I25119 Atherosclerotic heart disease of native coronary artery with unspecified angina pectoris: Secondary | ICD-10-CM | POA: Diagnosis not present

## 2022-06-01 DIAGNOSIS — R488 Other symbolic dysfunctions: Secondary | ICD-10-CM | POA: Diagnosis not present

## 2022-06-01 DIAGNOSIS — F419 Anxiety disorder, unspecified: Secondary | ICD-10-CM | POA: Diagnosis not present

## 2022-06-01 DIAGNOSIS — S72132D Displaced apophyseal fracture of left femur, subsequent encounter for closed fracture with routine healing: Secondary | ICD-10-CM | POA: Diagnosis not present

## 2022-06-01 DIAGNOSIS — R52 Pain, unspecified: Secondary | ICD-10-CM | POA: Diagnosis not present

## 2022-06-01 DIAGNOSIS — Z9861 Coronary angioplasty status: Secondary | ICD-10-CM | POA: Diagnosis not present

## 2022-06-01 DIAGNOSIS — S72002A Fracture of unspecified part of neck of left femur, initial encounter for closed fracture: Secondary | ICD-10-CM | POA: Diagnosis not present

## 2022-06-01 DIAGNOSIS — Z7189 Other specified counseling: Secondary | ICD-10-CM | POA: Diagnosis not present

## 2022-06-01 DIAGNOSIS — N1831 Chronic kidney disease, stage 3a: Secondary | ICD-10-CM | POA: Diagnosis not present

## 2022-06-01 DIAGNOSIS — E1165 Type 2 diabetes mellitus with hyperglycemia: Secondary | ICD-10-CM | POA: Diagnosis not present

## 2022-06-01 DIAGNOSIS — R296 Repeated falls: Secondary | ICD-10-CM | POA: Diagnosis not present

## 2022-06-01 DIAGNOSIS — Z9889 Other specified postprocedural states: Secondary | ICD-10-CM

## 2022-06-01 DIAGNOSIS — E876 Hypokalemia: Secondary | ICD-10-CM | POA: Diagnosis not present

## 2022-06-01 DIAGNOSIS — K5900 Constipation, unspecified: Secondary | ICD-10-CM | POA: Diagnosis not present

## 2022-06-01 DIAGNOSIS — G20A1 Parkinson's disease without dyskinesia, without mention of fluctuations: Secondary | ICD-10-CM | POA: Diagnosis not present

## 2022-06-01 DIAGNOSIS — R69 Illness, unspecified: Secondary | ICD-10-CM | POA: Diagnosis not present

## 2022-06-01 DIAGNOSIS — F4322 Adjustment disorder with anxiety: Secondary | ICD-10-CM | POA: Diagnosis not present

## 2022-06-01 DIAGNOSIS — S72042D Displaced fracture of base of neck of left femur, subsequent encounter for closed fracture with routine healing: Secondary | ICD-10-CM | POA: Diagnosis not present

## 2022-06-01 DIAGNOSIS — I2510 Atherosclerotic heart disease of native coronary artery without angina pectoris: Secondary | ICD-10-CM | POA: Diagnosis not present

## 2022-06-01 DIAGNOSIS — F331 Major depressive disorder, recurrent, moderate: Secondary | ICD-10-CM | POA: Diagnosis not present

## 2022-06-01 DIAGNOSIS — F03918 Unspecified dementia, unspecified severity, with other behavioral disturbance: Secondary | ICD-10-CM | POA: Diagnosis not present

## 2022-06-01 DIAGNOSIS — Z66 Do not resuscitate: Secondary | ICD-10-CM | POA: Diagnosis not present

## 2022-06-01 DIAGNOSIS — G20A2 Parkinson's disease without dyskinesia, with fluctuations: Secondary | ICD-10-CM | POA: Diagnosis not present

## 2022-06-01 DIAGNOSIS — H18511 Endothelial corneal dystrophy, right eye: Secondary | ICD-10-CM | POA: Diagnosis not present

## 2022-06-01 DIAGNOSIS — Z515 Encounter for palliative care: Secondary | ICD-10-CM | POA: Diagnosis not present

## 2022-06-01 DIAGNOSIS — S72142A Displaced intertrochanteric fracture of left femur, initial encounter for closed fracture: Secondary | ICD-10-CM | POA: Diagnosis not present

## 2022-06-01 DIAGNOSIS — R279 Unspecified lack of coordination: Secondary | ICD-10-CM | POA: Diagnosis not present

## 2022-06-01 DIAGNOSIS — R404 Transient alteration of awareness: Secondary | ICD-10-CM | POA: Diagnosis not present

## 2022-06-01 DIAGNOSIS — Z7401 Bed confinement status: Secondary | ICD-10-CM | POA: Diagnosis not present

## 2022-06-01 DIAGNOSIS — R3981 Functional urinary incontinence: Secondary | ICD-10-CM | POA: Diagnosis not present

## 2022-06-01 DIAGNOSIS — G4700 Insomnia, unspecified: Secondary | ICD-10-CM | POA: Diagnosis not present

## 2022-06-01 LAB — GLUCOSE, CAPILLARY
Glucose-Capillary: 169 mg/dL — ABNORMAL HIGH (ref 70–99)
Glucose-Capillary: 178 mg/dL — ABNORMAL HIGH (ref 70–99)
Glucose-Capillary: 180 mg/dL — ABNORMAL HIGH (ref 70–99)

## 2022-06-01 MED ORDER — POLYETHYLENE GLYCOL 3350 17 G PO PACK: 17.0000 g | PACK | Freq: Every day | ORAL | 0 refills | Status: DC | PRN

## 2022-06-01 MED ORDER — HYDROCODONE-ACETAMINOPHEN 5-325 MG PO TABS: 1.0000 | ORAL_TABLET | ORAL | 0 refills | Status: AC | PRN

## 2022-06-01 MED ORDER — ENOXAPARIN SODIUM 40 MG/0.4ML IJ SOSY: 40.0000 mg | PREFILLED_SYRINGE | INTRAMUSCULAR | 0 refills | Status: DC

## 2022-06-01 MED ORDER — SENNOSIDES-DOCUSATE SODIUM 8.6-50 MG PO TABS: 1.0000 | ORAL_TABLET | Freq: Two times a day (BID) | ORAL | 0 refills | Status: AC

## 2022-06-01 MED ORDER — ACETAMINOPHEN 500 MG PO TABS: 1000.0000 mg | ORAL_TABLET | Freq: Three times a day (TID) | ORAL | Status: AC

## 2022-06-01 MED ORDER — QUETIAPINE FUMARATE 50 MG PO TABS: 50.0000 mg | ORAL_TABLET | Freq: Every day | ORAL | 0 refills | Status: DC

## 2022-06-01 MED ORDER — METHOCARBAMOL 500 MG PO TABS: 500.0000 mg | ORAL_TABLET | Freq: Four times a day (QID) | ORAL | 0 refills | Status: DC | PRN

## 2022-06-01 NOTE — Progress Notes (Signed)
Daily Progress Note   Patient Name: Joe Murray       Date: 06/01/2022 DOB: October 23, 1940  Age: 82 y.o. MRN#: [id number] Attending Physician: Glade Lloyd, MD Primary Care Physician: Nelwyn Salisbury, MD Admit Date: 05/25/2022 Length of Stay: 7 days  Reason for Consultation/Follow-up: Establishing goals of care   HPI/Patient Profile:  82 y.o. male  with past medical history of dementia, Parkinson's, CAD, CKD, and DM admitted on 05/25/2022 after a fall at home resulting in left hip fracture.  Patient underwent surgical repair on 4/3.  Patient with ongoing confusion worse than baseline following surgery.  PMT consulted to discuss goals of care.     Subjective:   Subjective: Chart Reviewed. Updates received. Patient Assessed. Created space and opportunity for patient  and family to explore thoughts and feelings regarding current medical situation.  Today's Discussion: Today I saw the patient at the bedside, he was sleeping and I elected not to wake him.  His wife was present at the bedside.  She states he has been somnolent and not very responsive most of the day today.  Yesterday he was much more awake and "chattering" all day but not very coherent.  We again discussed advanced dementia and Parkinson's with an acute illness such as a fracture often resulting in a decline in baseline status.  It will take some time to know whether he will improve from this point, but this may become his new baseline.  His wife was understandably intermittently tearful.  She discusses that the book provided "hard choices for loving people" has been very informative and has helped her to understand her husband's current situation.  I provided a copy of the pamphlet "gone from my sight: The dying experience" to further inform things that she may see as the patient's health declines toward end-of-life.    I got a sense from her that she is starting to understand that this may be "the beginning of the end".  We discussed  that if he does not improve or significantly declines, we may be looking at a hospice situation.  I gave her permission to request hospice services if she feels it is appropriate.  At this time she seems to want to try rehab to see if he will get any better, but I feel she understands that she may be approaching hospice situation.  I provided emotional and general support through therapeutic listening, empathy, sharing of stories, therapeutic touch and other techniques. I answered all questions and addressed all concerns to the best of my ability.  Review of Systems  Unable to perform ROS   Objective:   Vital Signs:  BP (!) 114/54 (BP Location: Left Arm)   Pulse (!) 56   Temp 98 F (36.7 C)   Resp 16   Ht 5\' 8"  (1.727 m)   Wt 98.5 kg   SpO2 95%   BMI 33.02 kg/m   Physical Exam: Physical Exam Vitals and nursing note reviewed.  Constitutional:      General: He is sleeping. He is not in acute distress. HENT:     Head: Normocephalic and atraumatic.  Cardiovascular:     Rate and Rhythm: Normal rate.  Pulmonary:     Effort: Pulmonary effort is normal. No respiratory distress.  Abdominal:     General: Abdomen is flat. Bowel sounds are normal. There is no distension.     Palpations: Abdomen is soft.  Skin:    General: Skin is warm and dry.  Palliative Assessment/Data: 20-30%    Existing Vynca/ACP Documentation: MOST form signed 05/30/2022 (summarized below)  Cardiopulmonary Resuscitation: Do Not Attempt Resuscitation (DNR/No CPR)  Medical Interventions: Limited Additional Interventions: Use medical treatment, IV fluids and cardiac monitoring as indicated, DO NOT USE intubation or mechanical ventilation. May consider use of less invasive airway support such as BiPAP or CPAP. Also provide comfort measures. Transfer to the hospital if indicated. Avoid intensive care.   Antibiotics: Antibiotics if indicated  IV Fluids: IV fluids if indicated  Feeding Tube: No feeding tube     Assessment & Plan:   Impression: Present on Admission:  Closed intertrochanteric fracture of left femur, initial encounter  DM (diabetes mellitus), type 2 with complications - CAD  Parkinson's disease  SUMMARY OF RECOMMENDATIONS   DNR Continue current scope of care MOST form elections as per above Anticipate discharge to SNF/rehab today Time for outcomes in SNF May be approaching hospice situation, wife understands  Symptom Management:  Per primary team PMT is available to assist as needed  Code Status: DNR  Prognosis: Unable to determine  Discharge Planning:  SNF/Rehab  Discussed with: Patient's family, medical team, nursing team  Thank you for allowing [place] to participate in the care of Joe Murray PMT will continue to support holistically.  Billing based on MDM: High  Problems Addressed: One acute or chronic illness or injury that poses a threat to life or bodily function  Amount and/or Complexity of Data: Category 3:Discussion of management or test interpretation with external physician/other qualified health care professional/appropriate source (not separately reported)  Risks: confirmed goals again today including DNR    Wynne Dust, NP Palliative Medicine Team  Team Phone # [phone] (Nights/Weekends)  10/22/2020, 8:17 AM

## 2022-06-01 NOTE — Telephone Encounter (Signed)
(  4:31 pm) PC SW talked with patient's wife-Joe Murray regarding palliative care referral received from DR. Abran Cantor She advised that patient moved to [facility] (Room 107) today. SW provided her education regarding palliative care services. SW advised her that patient will be moved to LTC list where he will be followed by the NP.

## 2022-06-01 NOTE — Plan of Care (Signed)
Patient is stable for discharge to SNF. Wife is at bedside, Sharin Mons will transfer patient to facility.

## 2022-06-01 NOTE — Progress Notes (Signed)
     Subjective: Patient lying comfortably in bed this morning. No family at bedside. Mild discomfort with left leg manipulation. Not answering questions due to baseline dementia.  Objective:   VITALS:   Vitals:   05/31/22 0553 05/31/22 1231 05/31/22 2036 06/01/22 0540  BP: (!) 148/76 138/63 (!) 144/76 128/67  Pulse: (!) 54 92 70 65  Resp: 18 18 18 18   Temp: 98 F (36.7 C) 98.6 F (37 C) 98.2 F (36.8 C) (!) 97.5 F (36.4 C)  TempSrc: Oral  Oral Oral  SpO2: 96% 99% 98% 96%  Weight:      Height:        Sensation intact distally Intact pulses distally Dorsiflexion/Plantar flexion intact Incision: dressing C/D/I No cellulitis present Compartment soft   Lab Results  Component Value Date   WBC 12.2 (H) 05/28/2022   HGB 14.0 05/28/2022   HCT 42.2 05/28/2022   MCV 95.7 05/28/2022   PLT 153 05/28/2022   BMET    Component Value Date/Time   NA 136 05/28/2022 0816   NA 142 06/01/2019 0824   K 3.7 05/28/2022 0816   CL 101 05/28/2022 0816   CO2 25 05/28/2022 0816   GLUCOSE 119 (H) 05/28/2022 0816   BUN 22 05/28/2022 0816   BUN 22 06/01/2019 0824   CREATININE 1.19 05/28/2022 0816   CREATININE 1.26 (H) 11/07/2019 1048   CALCIUM 8.5 (L) 05/28/2022 0816   GFRNONAA >60 05/28/2022 0816   Xray: Cannulated screws in good position femoral neck fracture well aligned no adverse  Assessment/Plan: 5 Days Post-Op   Principal Problem:   Closed intertrochanteric fracture of left femur, initial encounter Active Problems:   CAD S/P percutaneous coronary angioplasty - LADx2, & RCA x 3   DM (diabetes mellitus), type 2 with complications - CAD   Parkinson's disease   Chronic kidney disease, stage 3a  Status post left hip CRPP for valgus impacted femoral neck fracture 05/27/22  Post op recs: WB: WBAT  Abx: ancef x23 hours post op Imaging: PACU xrays Dressing: keep intact until follow up, change PRN if soiled or saturated. DVT prophylaxis: lovenox starting POD1 x4 weeks Follow  up: 2 weeks after surgery for a wound check with Dr. Blanchie Dessert at [facility]  Address: [address], [place], [place] [address]  Office Phone: [phone]  Joen Laura 06/01/2022, 7:44 AM   Weber Cooks, MD  Contact information:   [phone] 7am-5pm epic message Dr. Blanchie Dessert, or call office for patient follow up: [phone] After hours and holidays please check [email or web address] for group call information for [facility]

## 2022-06-01 NOTE — Telephone Encounter (Signed)
FYI

## 2022-06-01 NOTE — Telephone Encounter (Signed)
Perfect, thanks 

## 2022-06-01 NOTE — Telephone Encounter (Signed)
A referral to Palliative Care is already in pt chart placed today by Glade Lloyd, MD.

## 2022-06-01 NOTE — Discharge Summary (Addendum)
Physician Discharge Summary  Leane PlattClyde D Hausner ZOX:[id number] DOB: April 12, 1940 DOA: 05/25/2022  PCP: Nelwyn SalisburyFry, Stephen A, MD  Admit date: 05/25/2022 Discharge date: 06/01/2022  Admitted From: Home Disposition: SNF  Recommendations for Outpatient Follow-up:  Follow up with SNF provider at earliest convenience Outpatient follow-up with orthopedics.  Discharge pain management/DVT prophylaxis/wound care/activity as per orthopedics recommendations Recommend outpatient follow-up with palliative care Outpatient follow-up with neurology Follow up in ED if symptoms worsen or new appear   Home Health: No Equipment/Devices: None  Discharge Condition: Guarded CODE STATUS: DNR  diet recommendation: Carb modified  Brief/Interim Summary: 82 year old male with history of Parkinson disease, dementia presented after a fall at home resulting in a left femoral neck fracture. He underwent surgical intervention by orthopedics on 05/27/2022.  Subsequently, PT recommended SNF placement.  He will be discharged to SNF once bed is available.    Discharge Diagnoses:   Left femoral neck fracture after a fall -underwent surgical intervention by orthopedics on 05/27/2022.  Pain management/wound care/activity/DVT prophylaxis as per orthopedics recommendations. -Fall precautions -PT recommending SNF placement.  Currently medically stable for discharge.  Discharge to SNF once bed is available.  CKD stage IIIa -Creatinine stable.    Leukocytosis -Possibly reactive.  No labs today.   Diabetes mellitus type 2 with hyperglycemia -Continue carb modified diet.  Resume glipizide.  Outpatient follow-up.   Hypertension -Monitor blood pressure.  Continue amlodipine   Parkinson's disease Parkinson's dementia with hallucinations, paranoia, agitation, disturbed wake/sleep cycle -Continue Sinemet and memantine.  Outpatient follow-up with neurology. -Seroquel started on 05/27/2022 after discussion with patient's daughter.  Continue  the same for now on discharge as well. -Palliative care evaluation and follow-up appreciated.  Will need outpatient palliative care follow-up.   CAD status post stenting -Most recent PCI in January 2017 with DES to RCA and mid LAD. -Continue aspirin.  Not on beta-blocker due to history of bradycardia. -On Repatha as an outpatient.  Not on statin due to myalgias  Discharge Instructions  Discharge Instructions     Amb Referral to Palliative Care   Complete by: As directed    Diet Carb Modified   Complete by: As directed    Increase activity slowly   Complete by: As directed       Allergies as of 06/01/2022       Reactions   Beta Adrenergic Blockers Other (See Comments)   chronotropic incompetence   Praluent [alirocumab]    Diffuse Rash - improved when stopping & taking Benadryl   Sulfonamide Derivatives    unknown   Statins Other (See Comments)   REACTION: myalgias        Medication List     TAKE these medications    acetaminophen 500 MG tablet Commonly known as: TYLENOL Take 2 tablets (1,000 mg total) by mouth 3 (three) times daily.   amLODipine 2.5 MG tablet Commonly known as: NORVASC Take 1 tablet (2.5 mg total) by mouth daily.   aspirin EC 81 MG tablet Take 81 mg by mouth daily.   BD Lancet Ultrafine 30G Misc USE ONE  TO CHECK GLUCOSE ONCE DAILY   carbidopa-levodopa 25-100 MG tablet Commonly known as: SINEMET IR Take 1 tablet three times a day with meals   cyanocobalamin 1000 MCG tablet Commonly known as: VITAMIN B12 Take 1,000 mcg by mouth daily.   enoxaparin 40 MG/0.4ML injection Commonly known as: LOVENOX Inject 0.4 mLs (40 mg total) into the skin daily for 28 days.   glipiZIDE 5 MG tablet Commonly known as:  GLUCOTROL Take 1 tablet (5 mg total) by mouth 2 (two) times daily before a meal.   HYDROcodone-acetaminophen 5-325 MG tablet Commonly known as: NORCO/VICODIN Take 1 tablet by mouth every 4 (four) hours as needed for up to 7 days for  moderate pain.   MAGNESIUM CHLORIDE PO Take 250 mg by mouth daily.   memantine 10 MG tablet Commonly known as: NAMENDA Take 1 tablet by mouth twice daily   methocarbamol 500 MG tablet Commonly known as: ROBAXIN Take 1 tablet (500 mg total) by mouth every 6 (six) hours as needed for muscle spasms.   nitroGLYCERIN 0.4 MG SL tablet Commonly known as: NITROSTAT PLACE 1 TABLET UNDER THE TONGUE EVERY 5 MINUTES FOR 3 DOSES AS NEEDED FOR CHEST PAIN   OneTouch Ultra test strip Generic drug: glucose blood USE TO TEST DAILY   polyethylene glycol 17 g packet Commonly known as: MIRALAX / GLYCOLAX Take 17 g by mouth daily as needed for moderate constipation.   POTASSIUM GLUCONATE PO Take 99 mg by mouth daily.   prednisoLONE acetate 1 % ophthalmic suspension Commonly known as: PRED FORTE Place 1 drop into the right eye daily.   QUEtiapine 50 MG tablet Commonly known as: SEROQUEL Take 1 tablet (50 mg total) by mouth at bedtime.   Repatha Pushtronex System 420 MG/3.5ML Soct Generic drug: Evolocumab with Infusor Inject 3.5 mLs into the skin every 30 (thirty) days.   senna-docusate 8.6-50 MG tablet Commonly known as: Senokot-S Take 1 tablet by mouth 2 (two) times daily.          Follow-up Information     Joen Laura, MD Follow up in 2 week(s).   Specialty: Orthopedic Surgery Contact information: [address] [place] [address] [phone]                Allergies  Allergen Reactions   Beta Adrenergic Blockers Other (See Comments)    chronotropic incompetence   Praluent [Alirocumab]     Diffuse Rash - improved when stopping & taking Benadryl   Sulfonamide Derivatives     unknown   Statins Other (See Comments)    REACTION: myalgias    Consultations: Orthopedics/palliative care   Procedures/Studies: DG HIP UNILAT W OR W/O PELVIS 2-3 VIEWS LEFT  Result Date: 05/27/2022 CLINICAL DATA:  Insert is EXAM: DG HIP (WITH OR WITHOUT PELVIS)  2-3V LEFT COMPARISON:  None Available. FINDINGS: Postsurgical changes of left femoral neck fixation. Hardware is intact without evidence of immediate complication. Expected soft tissue changes. IMPRESSION: Postsurgical changes of left femoral neck fixation. Hardware is intact without evidence of immediate complication. Electronically Signed   By: Caprice Renshaw M.D.   On: 05/27/2022 16:40   DG HIP UNILAT WITH PELVIS 1V LEFT  Result Date: 05/27/2022 CLINICAL DATA:  [id number] Surgery, elective [id number] EXAM: DG HIP (WITH OR WITHOUT PELVIS) 1V*L* COMPARISON:  CT 05/25/2022 FINDINGS: Intraoperative images during left femoral neck fixation with percutaneous screws for subcapital femoral neck fracture. IMPRESSION: Intraoperative images during left femoral neck fixation with percutaneous screws. Electronically Signed   By: Caprice Renshaw M.D.   On: 05/27/2022 16:39   DG C-Arm 1-60 Min-No Report  Result Date: 05/27/2022 Fluoroscopy was utilized by the requesting physician.  No radiographic interpretation.   DG ELBOW COMPLETE LEFT (3+VIEW)  Result Date: 05/26/2022 CLINICAL DATA:  Pain after fall EXAM: LEFT ELBOW - COMPLETE 4 VIEW COMPARISON:  None Available. FINDINGS: No fracture or dislocation. Joint space loss seen about the elbow diffusely with  some hyperostosis. No joint effusion on lateral view. Osteopenia. IMPRESSION: Degenerative changes.  Osteopenia Electronically Signed   By: Karen Kays M.D.   On: 05/26/2022 13:14   CT Hip Right Wo Contrast  Addendum Date: 05/25/2022   ADDENDUM REPORT: 05/25/2022 22:45 ADDENDUM: Images of the left hip are now made available for evaluation. An impacted subcapital left femoral neck fracture is present with fracture fragments in near anatomic alignment. No dislocation. Mild left hip degenerative arthritis. Diffuse osteopenia. Visualized left hemipelvis is intact. Electronically Signed   By: Helyn Numbers M.D.   On: 05/25/2022 22:45   Result Date: 05/25/2022 CLINICAL DATA:  Hip  trauma, fracture suspected. Witnessed fall with right hip pain. EXAM: CT OF THE RIGHT HIP WITHOUT CONTRAST TECHNIQUE: Multidetector CT imaging of the right hip was performed according to the standard protocol. Multiplanar CT image reconstructions were also generated. RADIATION DOSE REDUCTION: This exam was performed according to the departmental dose-optimization program which includes automated exposure control, adjustment of the mA and/or kV according to patient size and/or use of iterative reconstruction technique. COMPARISON:  None Available. FINDINGS: Bones/Joint/Cartilage Osseous structures are diffusely osteopenic. No acute fracture or dislocation. Mild right hip degenerative arthritis. Degenerative changes are noted at the lumbosacral junction. No lytic or blastic bone lesion. Ligaments Suboptimally assessed by CT. Muscles and Tendons Unremarkable Soft tissues Sigmoid colonic diverticulosis. Moderate iliac atherosclerotic calcification. Small right fat containing inguinal hernia. IMPRESSION: 1. No acute fracture or dislocation of the right hip. Please note, prior plain film examination demonstrating a subcapital femoral neck fracture was of the LEFT hip and the given history reported LEFT hip pain. 2. Mild right hip degenerative arthritis. Electronically Signed: By: Helyn Numbers M.D. On: 05/25/2022 22:06   DG Knee Complete 4 Views Left  Result Date: 05/25/2022 CLINICAL DATA:  Unwitnessed fall at 1600 hours today. EXAM: LEFT KNEE - COMPLETE 4+ VIEW COMPARISON:  None Available. FINDINGS: Mildly decreased bone mineralization. Moderate-to-large superior patellar degenerative osteophyte. Tiny inferior patellar degenerative osteophyte. Likely at least moderate patellofemoral joint space narrowing. Mild chronic enthesopathic change at the quadriceps insertion on the patella. No joint effusion.  No acute fracture or dislocation. Mild-to-moderate atherosclerotic calcifications. IMPRESSION: 1. No acute fracture.  2. Moderate patellofemoral osteoarthritis. Electronically Signed   By: Neita Garnet M.D.   On: 05/25/2022 19:09   DG Hip Unilat W or Wo Pelvis 2-3 Views Left  Result Date: 05/25/2022 CLINICAL DATA:  Unwitnessed fall today at 1600 hours. Left hip and left knee pain. EXAM: DG HIP (WITH OR WITHOUT PELVIS) 2-3V LEFT COMPARISON:  CT abdomen and pelvis 05/05/2016 FINDINGS: There is an acute fracture of the proximal left femoral neck with mild approximate 3-4 mm inferior and anterior displacement of the distal fracture component with respect to the proximal fracture component. Mild bilateral superomedial femoroacetabular joint space narrowing. Moderate bilateral superolateral acetabular degenerative osteophytosis. The pubic symphysis joint space is maintained. No acute fracture or dislocation. A vascular phlebolith overlies the left hemipelvis. IMPRESSION: Acute mildly displaced fracture of the proximal left femoral neck. Electronically Signed   By: Neita Garnet M.D.   On: 05/25/2022 19:06   DG Chest 2 View  Result Date: 05/25/2022 CLINICAL DATA:  Unwitnessed fall at 1600 hours today. EXAM: CHEST - 2 VIEW COMPARISON:  Chest radiographs 01/10/2018 and 02/25/2015 FINDINGS: Cardiac silhouette is at the upper limits of normal size for AP technique. Mildly decreased lung volumes. No focal airspace opacity. No pleural effusion or pneumothorax. Mild-to-moderate multilevel degenerative disc changes of the thoracic  spine. IMPRESSION: Mildly decreased lung volumes. No acute cardiopulmonary process. Electronically Signed   By: Neita Garnet M.D.   On: 05/25/2022 19:04   CT Head Wo Contrast  Result Date: 05/25/2022 CLINICAL DATA:  Trauma unwitnessed fall EXAM: CT HEAD WITHOUT CONTRAST CT CERVICAL SPINE WITHOUT CONTRAST TECHNIQUE: Multidetector CT imaging of the head and cervical spine was performed following the standard protocol without intravenous contrast. Multiplanar CT image reconstructions of the cervical spine were  also generated. RADIATION DOSE REDUCTION: This exam was performed according to the departmental dose-optimization program which includes automated exposure control, adjustment of the mA and/or kV according to patient size and/or use of iterative reconstruction technique. COMPARISON:  CT brain and cervical spine 11/18/2019 FINDINGS: CT HEAD FINDINGS Brain: No acute territorial infarction, hemorrhage or intracranial mass. Mild atrophy. Mild white matter hypodensity consistent with chronic small vessel ischemic change. Stable ventricle size. Vascular: No hyperdense vessels.  Carotid vascular calcification Skull: Normal. Negative for fracture or focal lesion. Sinuses/Orbits: No acute finding. Other: None CT CERVICAL SPINE FINDINGS Alignment: No subluxation.  Facet alignment within normal limits Skull base and vertebrae: No acute fracture. No primary bone lesion or focal pathologic process. Soft tissues and spinal canal: No prevertebral fluid or swelling. No visible canal hematoma. Disc levels: Moderate degenerative changes at C2-C3, C5-C6, C6-C7 and C7-T1. Facet degenerative changes at multiple levels with foraminal narrowing, worse at C5-C6. Upper chest: Negative. Other: Emphysema at the apices IMPRESSION: No CT evidence for acute intracranial abnormality. Atrophy and chronic small vessel ischemic changes of the white matter. Degenerative changes of the cervical spine without acute osseous abnormality. Emphysema. Emphysema (ICD10-J43.9). Electronically Signed   By: Jasmine Pang M.D.   On: 05/25/2022 18:20   CT Cervical Spine Wo Contrast  Result Date: 05/25/2022 CLINICAL DATA:  Trauma unwitnessed fall EXAM: CT HEAD WITHOUT CONTRAST CT CERVICAL SPINE WITHOUT CONTRAST TECHNIQUE: Multidetector CT imaging of the head and cervical spine was performed following the standard protocol without intravenous contrast. Multiplanar CT image reconstructions of the cervical spine were also generated. RADIATION DOSE REDUCTION: This  exam was performed according to the departmental dose-optimization program which includes automated exposure control, adjustment of the mA and/or kV according to patient size and/or use of iterative reconstruction technique. COMPARISON:  CT brain and cervical spine 11/18/2019 FINDINGS: CT HEAD FINDINGS Brain: No acute territorial infarction, hemorrhage or intracranial mass. Mild atrophy. Mild white matter hypodensity consistent with chronic small vessel ischemic change. Stable ventricle size. Vascular: No hyperdense vessels.  Carotid vascular calcification Skull: Normal. Negative for fracture or focal lesion. Sinuses/Orbits: No acute finding. Other: None CT CERVICAL SPINE FINDINGS Alignment: No subluxation.  Facet alignment within normal limits Skull base and vertebrae: No acute fracture. No primary bone lesion or focal pathologic process. Soft tissues and spinal canal: No prevertebral fluid or swelling. No visible canal hematoma. Disc levels: Moderate degenerative changes at C2-C3, C5-C6, C6-C7 and C7-T1. Facet degenerative changes at multiple levels with foraminal narrowing, worse at C5-C6. Upper chest: Negative. Other: Emphysema at the apices IMPRESSION: No CT evidence for acute intracranial abnormality. Atrophy and chronic small vessel ischemic changes of the white matter. Degenerative changes of the cervical spine without acute osseous abnormality. Emphysema. Emphysema (ICD10-J43.9). Electronically Signed   By: Jasmine Pang M.D.   On: 05/25/2022 18:20      Subjective: Patient seen and examined at bedside.  Awake, extremely slow to respond, confused.  Wife at bedside.  No fever, agitation, vomiting reported.  Oral intake is slightly improving but still  not that good.  Discharge Exam: Vitals:   05/31/22 2036 06/01/22 0540  BP: (!) 144/76 128/67  Pulse: 70 65  Resp: 18 18  Temp: 98.2 F (36.8 C) (!) 97.5 F (36.4 C)  SpO2: 98% 96%    General: Pt is awake, extremely slow to respond, confused.   Slow to respond.  Poor historian. Cardiovascular: rate controlled, S1/S2 + Respiratory: bilateral decreased breath sounds at bases Abdominal: Soft, NT, ND, bowel sounds + Extremities: Trace lower extremity edema present; no cyanosis    The results of significant diagnostics from this hospitalization (including imaging, microbiology, ancillary and laboratory) are listed below for reference.     Microbiology: Recent Results (from the past 240 hour(s))  Surgical PCR screen     Status: None   Collection Time: 05/26/22  2:22 PM   Specimen: Nasal Mucosa; Nasal Swab  Result Value Ref Range Status   MRSA, PCR NEGATIVE NEGATIVE Final   Staphylococcus aureus NEGATIVE NEGATIVE Final    Comment: (NOTE) The Xpert SA Assay (FDA approved for NASAL specimens in patients 37 years of age and older), is one component of a comprehensive surveillance program. It is not intended to diagnose infection nor to guide or monitor treatment. Performed at [facility], [address], [place], [place] [address]      Labs: BNP (last 3 results) No results for input(s): "BNP" in the last 8760 hours. Basic Metabolic Panel: Recent Labs  Lab 05/25/22 1827 05/26/22 0147 05/28/22 0816  NA 139 136 136  K 4.0 3.6 3.7  CL 105 104 101  CO2 22 23 25   GLUCOSE 179* 209* 119*  BUN 22 20 22   CREATININE 1.35* 1.24 1.19  CALCIUM 9.1 8.8* 8.5*   Liver Function Tests: Recent Labs  Lab 05/25/22 1827  AST 26  ALT 20  ALKPHOS 62  BILITOT 0.5  PROT 7.4  ALBUMIN 4.3   No results for input(s): "LIPASE", "AMYLASE" in the last 168 hours. No results for input(s): "AMMONIA" in the last 168 hours. CBC: Recent Labs  Lab 05/25/22 1827 05/26/22 0147 05/28/22 0816  WBC 14.3* 12.5* 12.2*  NEUTROABS 12.4*  --   --   HGB 15.1 13.4 14.0  HCT 45.4 40.0 42.2  MCV 95.6 95.0 95.7  PLT 185 163 153   Cardiac Enzymes: No results for input(s): "CKTOTAL", "CKMB", "CKMBINDEX", "TROPONINI" in the last  168 hours. BNP: Invalid input(s): "POCBNP" CBG: Recent Labs  Lab 05/31/22 1656 05/31/22 2007 06/01/22 0008 06/01/22 0352 06/01/22 0728  GLUCAP 142* 214* 178* 180* 169*   D-Dimer No results for input(s): "DDIMER" in the last 72 hours. Hgb A1c No results for input(s): "HGBA1C" in the last 72 hours. Lipid Profile No results for input(s): "CHOL", "HDL", "LDLCALC", "TRIG", "CHOLHDL", "LDLDIRECT" in the last 72 hours. Thyroid function studies No results for input(s): "TSH", "T4TOTAL", "T3FREE", "THYROIDAB" in the last 72 hours.  Invalid input(s): "FREET3" Anemia work up No results for input(s): "VITAMINB12", "FOLATE", "FERRITIN", "TIBC", "IRON", "RETICCTPCT" in the last 72 hours. Urinalysis    Component Value Date/Time   COLORURINE YELLOW 05/25/2022 1827   APPEARANCEUR CLEAR 05/25/2022 1827   LABSPEC 1.017 05/25/2022 1827   PHURINE 5.0 05/25/2022 1827   GLUCOSEU 150 (A) 05/25/2022 1827   HGBUR MODERATE (A) 05/25/2022 1827   HGBUR trace-lysed 07/27/2007 1107   BILIRUBINUR NEGATIVE 05/25/2022 1827   BILIRUBINUR Negative 02/06/2022 1647   KETONESUR NEGATIVE 05/25/2022 1827   PROTEINUR 30 (A) 05/25/2022 1827   UROBILINOGEN 0.2 02/06/2022 1647  UROBILINOGEN 0.2 07/27/2007 1107   NITRITE NEGATIVE 05/25/2022 1827   LEUKOCYTESUR NEGATIVE 05/25/2022 1827   Sepsis Labs Recent Labs  Lab 05/25/22 1827 05/26/22 0147 05/28/22 0816  WBC 14.3* 12.5* 12.2*   Microbiology Recent Results (from the past 240 hour(s))  Surgical PCR screen     Status: None   Collection Time: 05/26/22  2:22 PM   Specimen: Nasal Mucosa; Nasal Swab  Result Value Ref Range Status   MRSA, PCR NEGATIVE NEGATIVE Final   Staphylococcus aureus NEGATIVE NEGATIVE Final    Comment: (NOTE) The Xpert SA Assay (FDA approved for NASAL specimens in patients 61 years of age and older), is one component of a comprehensive surveillance program. It is not intended to diagnose infection nor to guide or monitor  treatment. Performed at [facility], [address], [place], [place] [address]      Time coordinating discharge: 35 minutes  SIGNED:   Glade Lloyd, MD  [facility] 06/01/2022, 9:47 AM

## 2022-06-01 NOTE — Telephone Encounter (Signed)
Please do a referral to Palliative Care for this patient with Alzheimer's dementia and recent hip surgery

## 2022-06-01 NOTE — TOC Transition Note (Signed)
Transition of Care [facility]) - CM/SW Discharge Note   Patient Details  Name: Joe Murray MRN: [id number] Date of Birth: [birth year]  Transition of Care [facility]) CM/SW Contact:  Amada Jupiter, LCSW Phone Number: 06/01/2022, 11:20 AM   Clinical Narrative:     Pt is medically cleared for dc to SNF today.  Pt/ wife have accepted bed at [facility] and facility has received insurance authorization.  PTAR called at 11:20 am.  RN to call report to [phone].  No further TOC needs.  Final next level of care: Skilled Nursing Facility Barriers to Discharge: Barriers Resolved   Patient Goals and CMS Choice      Discharge Placement PASRR number recieved: 05/28/22 PASRR number recieved: 05/28/22            Patient chooses bed at: Other - please specify in the comment section below: [facility]) Patient to be transferred to facility by: PTAR Name of family member notified: wife Patient and family notified of of transfer: 06/01/22  Discharge Plan and Services Additional resources added to the After Visit Summary for   In-house Referral: Clinical Social Work   Post Acute Care Choice: Skilled Nursing Facility          DME Arranged: N/A DME Agency: NA                  Social Determinants of Health (SDOH) Interventions SDOH Screenings   Food Insecurity: No Food Insecurity (05/26/2022)  Housing: Low Risk  (05/26/2022)  Transportation Needs: No Transportation Needs (05/26/2022)  Utilities: Not At Risk (05/26/2022)  Alcohol Screen: Low Risk  (03/30/2022)  Depression (PHQ2-9): Low Risk  (03/30/2022)  Financial Resource Strain: Low Risk  (03/30/2022)  Recent Concern: Financial Resource Strain - Medium Risk (01/06/2022)  Physical Activity: Inactive (03/30/2022)  Social Connections: Socially Integrated (03/30/2022)  Stress: No Stress Concern Present (03/30/2022)  Tobacco Use: High Risk (05/28/2022)     Readmission Risk Interventions    06/01/2022   11:19 AM  Readmission Risk Prevention  Plan  Post Dischage Appt Complete  Medication Screening Complete  Transportation Screening Complete

## 2022-06-02 DIAGNOSIS — I10 Essential (primary) hypertension: Secondary | ICD-10-CM | POA: Diagnosis not present

## 2022-06-02 DIAGNOSIS — Z7189 Other specified counseling: Secondary | ICD-10-CM | POA: Diagnosis not present

## 2022-06-02 DIAGNOSIS — I2510 Atherosclerotic heart disease of native coronary artery without angina pectoris: Secondary | ICD-10-CM | POA: Diagnosis not present

## 2022-06-02 DIAGNOSIS — H18511 Endothelial corneal dystrophy, right eye: Secondary | ICD-10-CM | POA: Diagnosis not present

## 2022-06-02 DIAGNOSIS — E785 Hyperlipidemia, unspecified: Secondary | ICD-10-CM | POA: Diagnosis not present

## 2022-06-02 DIAGNOSIS — K5900 Constipation, unspecified: Secondary | ICD-10-CM | POA: Diagnosis not present

## 2022-06-02 DIAGNOSIS — R52 Pain, unspecified: Secondary | ICD-10-CM | POA: Diagnosis not present

## 2022-06-11 DIAGNOSIS — I2510 Atherosclerotic heart disease of native coronary artery without angina pectoris: Secondary | ICD-10-CM | POA: Diagnosis not present

## 2022-06-11 DIAGNOSIS — S72002A Fracture of unspecified part of neck of left femur, initial encounter for closed fracture: Secondary | ICD-10-CM | POA: Diagnosis not present

## 2022-06-11 DIAGNOSIS — E119 Type 2 diabetes mellitus without complications: Secondary | ICD-10-CM | POA: Diagnosis not present

## 2022-06-11 DIAGNOSIS — R69 Illness, unspecified: Secondary | ICD-10-CM | POA: Diagnosis not present

## 2022-06-11 DIAGNOSIS — G20A1 Parkinson's disease without dyskinesia, without mention of fluctuations: Secondary | ICD-10-CM | POA: Diagnosis not present

## 2022-06-11 DIAGNOSIS — S72042D Displaced fracture of base of neck of left femur, subsequent encounter for closed fracture with routine healing: Secondary | ICD-10-CM | POA: Diagnosis not present

## 2022-06-11 DIAGNOSIS — I10 Essential (primary) hypertension: Secondary | ICD-10-CM | POA: Diagnosis not present

## 2022-06-16 DIAGNOSIS — G20A1 Parkinson's disease without dyskinesia, without mention of fluctuations: Secondary | ICD-10-CM | POA: Diagnosis not present

## 2022-06-16 DIAGNOSIS — I25119 Atherosclerotic heart disease of native coronary artery with unspecified angina pectoris: Secondary | ICD-10-CM | POA: Diagnosis not present

## 2022-06-16 DIAGNOSIS — E1165 Type 2 diabetes mellitus with hyperglycemia: Secondary | ICD-10-CM | POA: Diagnosis not present

## 2022-06-17 ENCOUNTER — Telehealth: Payer: Self-pay | Admitting: Anesthesiology

## 2022-06-17 DIAGNOSIS — G20A1 Parkinson's disease without dyskinesia, without mention of fluctuations: Secondary | ICD-10-CM | POA: Diagnosis not present

## 2022-06-17 DIAGNOSIS — F419 Anxiety disorder, unspecified: Secondary | ICD-10-CM | POA: Diagnosis not present

## 2022-06-17 DIAGNOSIS — F03918 Unspecified dementia, unspecified severity, with other behavioral disturbance: Secondary | ICD-10-CM | POA: Diagnosis not present

## 2022-06-17 DIAGNOSIS — S72002A Fracture of unspecified part of neck of left femur, initial encounter for closed fracture: Secondary | ICD-10-CM | POA: Diagnosis not present

## 2022-06-17 NOTE — Telephone Encounter (Signed)
Pt's wife called stating pt fell and broke his hip on 4/1, he stayed in the hospital for a week, then he was transferred to [facility] for rehab, he has been there for 2 weeks now. States his dementia has gotten worse, he is very agitated, taking his clothes off all time and does not seem to be getting better. States she would like tot speak to [place] or Dr Karel Jarvis about pt's condition.

## 2022-06-17 NOTE — Telephone Encounter (Signed)
Pt's wife called in and left a message with the access nurse on 06/16/22. Stated the information below and that the facility has given the pt Ativan once, but it did not seem to help.  Full access nurse report is in [employer]

## 2022-06-17 NOTE — Telephone Encounter (Signed)
POA advised

## 2022-06-24 ENCOUNTER — Telehealth: Payer: Self-pay | Admitting: Anesthesiology

## 2022-06-24 DIAGNOSIS — F03918 Unspecified dementia, unspecified severity, with other behavioral disturbance: Secondary | ICD-10-CM | POA: Diagnosis not present

## 2022-06-24 NOTE — Telephone Encounter (Signed)
Terri Piedra NP from [employer], left message stating she would like to speak to Dr Karel Jarvis about pt. Call back number is [phone].

## 2022-06-24 NOTE — Telephone Encounter (Signed)
Called [employer] and NP [employer] is not at work today so they were unable to transfer me or get her number. She will be in tomorrow and return my call

## 2022-06-24 NOTE — Telephone Encounter (Signed)
Called number back and it said number you have dialed is not in service. Pls try calling [facility] and let NP know number we have is wrong. Pls ask what concern is, thanks

## 2022-06-25 DIAGNOSIS — R4189 Other symptoms and signs involving cognitive functions and awareness: Secondary | ICD-10-CM | POA: Diagnosis not present

## 2022-06-25 DIAGNOSIS — F419 Anxiety disorder, unspecified: Secondary | ICD-10-CM | POA: Diagnosis not present

## 2022-06-25 DIAGNOSIS — M25552 Pain in left hip: Secondary | ICD-10-CM | POA: Diagnosis not present

## 2022-06-25 DIAGNOSIS — R3981 Functional urinary incontinence: Secondary | ICD-10-CM | POA: Diagnosis not present

## 2022-06-25 DIAGNOSIS — I10 Essential (primary) hypertension: Secondary | ICD-10-CM | POA: Diagnosis not present

## 2022-06-25 DIAGNOSIS — I2510 Atherosclerotic heart disease of native coronary artery without angina pectoris: Secondary | ICD-10-CM | POA: Diagnosis not present

## 2022-06-25 DIAGNOSIS — S72002A Fracture of unspecified part of neck of left femur, initial encounter for closed fracture: Secondary | ICD-10-CM | POA: Diagnosis not present

## 2022-06-26 DIAGNOSIS — N390 Urinary tract infection, site not specified: Secondary | ICD-10-CM | POA: Diagnosis not present

## 2022-06-29 ENCOUNTER — Emergency Department (HOSPITAL_COMMUNITY): Payer: Medicare HMO

## 2022-06-29 ENCOUNTER — Inpatient Hospital Stay (HOSPITAL_COMMUNITY)
Admission: EM | Admit: 2022-06-29 | Discharge: 2022-07-06 | DRG: 522 | Disposition: A | Discharge status: Skilled Nursing Facility | Payer: Medicare HMO | Source: Skilled Nursing Facility | Attending: Internal Medicine | Admitting: Internal Medicine

## 2022-06-29 ENCOUNTER — Encounter (HOSPITAL_COMMUNITY): Payer: Self-pay

## 2022-06-29 ENCOUNTER — Other Ambulatory Visit: Payer: Self-pay

## 2022-06-29 ENCOUNTER — Inpatient Hospital Stay (HOSPITAL_COMMUNITY): Payer: Medicare HMO | Admitting: Anesthesiology

## 2022-06-29 DIAGNOSIS — I4589 Other specified conduction disorders: Secondary | ICD-10-CM | POA: Diagnosis not present

## 2022-06-29 DIAGNOSIS — G301 Alzheimer's disease with late onset: Secondary | ICD-10-CM | POA: Diagnosis not present

## 2022-06-29 DIAGNOSIS — G308 Other Alzheimer's disease: Secondary | ICD-10-CM | POA: Diagnosis not present

## 2022-06-29 DIAGNOSIS — I2510 Atherosclerotic heart disease of native coronary artery without angina pectoris: Secondary | ICD-10-CM | POA: Diagnosis not present

## 2022-06-29 DIAGNOSIS — Z7984 Long term (current) use of oral hypoglycemic drugs: Secondary | ICD-10-CM

## 2022-06-29 DIAGNOSIS — R109 Unspecified abdominal pain: Secondary | ICD-10-CM | POA: Diagnosis not present

## 2022-06-29 DIAGNOSIS — I252 Old myocardial infarction: Secondary | ICD-10-CM | POA: Diagnosis not present

## 2022-06-29 DIAGNOSIS — E538 Deficiency of other specified B group vitamins: Secondary | ICD-10-CM | POA: Diagnosis present

## 2022-06-29 DIAGNOSIS — Z85828 Personal history of other malignant neoplasm of skin: Secondary | ICD-10-CM

## 2022-06-29 DIAGNOSIS — N1831 Chronic kidney disease, stage 3a: Secondary | ICD-10-CM | POA: Diagnosis not present

## 2022-06-29 DIAGNOSIS — E876 Hypokalemia: Secondary | ICD-10-CM | POA: Diagnosis present

## 2022-06-29 DIAGNOSIS — F17210 Nicotine dependence, cigarettes, uncomplicated: Secondary | ICD-10-CM | POA: Diagnosis not present

## 2022-06-29 DIAGNOSIS — I10 Essential (primary) hypertension: Secondary | ICD-10-CM | POA: Diagnosis not present

## 2022-06-29 DIAGNOSIS — M25552 Pain in left hip: Principal | ICD-10-CM

## 2022-06-29 DIAGNOSIS — G309 Alzheimer's disease, unspecified: Secondary | ICD-10-CM | POA: Diagnosis present

## 2022-06-29 DIAGNOSIS — E1169 Type 2 diabetes mellitus with other specified complication: Secondary | ICD-10-CM | POA: Diagnosis not present

## 2022-06-29 DIAGNOSIS — Z515 Encounter for palliative care: Secondary | ICD-10-CM

## 2022-06-29 DIAGNOSIS — G4700 Insomnia, unspecified: Secondary | ICD-10-CM | POA: Diagnosis present

## 2022-06-29 DIAGNOSIS — R1111 Vomiting without nausea: Secondary | ICD-10-CM | POA: Diagnosis not present

## 2022-06-29 DIAGNOSIS — R4589 Other symptoms and signs involving emotional state: Secondary | ICD-10-CM | POA: Diagnosis not present

## 2022-06-29 DIAGNOSIS — W19XXXD Unspecified fall, subsequent encounter: Secondary | ICD-10-CM | POA: Diagnosis present

## 2022-06-29 DIAGNOSIS — Z882 Allergy status to sulfonamides status: Secondary | ICD-10-CM

## 2022-06-29 DIAGNOSIS — R079 Chest pain, unspecified: Secondary | ICD-10-CM | POA: Diagnosis not present

## 2022-06-29 DIAGNOSIS — S72002S Fracture of unspecified part of neck of left femur, sequela: Secondary | ICD-10-CM | POA: Diagnosis not present

## 2022-06-29 DIAGNOSIS — S72052K Unspecified fracture of head of left femur, subsequent encounter for closed fracture with nonunion: Secondary | ICD-10-CM | POA: Diagnosis not present

## 2022-06-29 DIAGNOSIS — Z8249 Family history of ischemic heart disease and other diseases of the circulatory system: Secondary | ICD-10-CM | POA: Diagnosis not present

## 2022-06-29 DIAGNOSIS — F0280 Dementia in other diseases classified elsewhere without behavioral disturbance: Secondary | ICD-10-CM | POA: Diagnosis not present

## 2022-06-29 DIAGNOSIS — G3183 Dementia with Lewy bodies: Secondary | ICD-10-CM | POA: Diagnosis not present

## 2022-06-29 DIAGNOSIS — S728X2A Other fracture of left femur, initial encounter for closed fracture: Secondary | ICD-10-CM | POA: Diagnosis not present

## 2022-06-29 DIAGNOSIS — E1122 Type 2 diabetes mellitus with diabetic chronic kidney disease: Secondary | ICD-10-CM | POA: Diagnosis present

## 2022-06-29 DIAGNOSIS — S72142A Displaced intertrochanteric fracture of left femur, initial encounter for closed fracture: Secondary | ICD-10-CM | POA: Diagnosis not present

## 2022-06-29 DIAGNOSIS — M62838 Other muscle spasm: Secondary | ICD-10-CM | POA: Diagnosis present

## 2022-06-29 DIAGNOSIS — S7292XA Unspecified fracture of left femur, initial encounter for closed fracture: Secondary | ICD-10-CM | POA: Diagnosis not present

## 2022-06-29 DIAGNOSIS — K219 Gastro-esophageal reflux disease without esophagitis: Secondary | ICD-10-CM | POA: Diagnosis not present

## 2022-06-29 DIAGNOSIS — S72002K Fracture of unspecified part of neck of left femur, subsequent encounter for closed fracture with nonunion: Secondary | ICD-10-CM | POA: Diagnosis not present

## 2022-06-29 DIAGNOSIS — S72002A Fracture of unspecified part of neck of left femur, initial encounter for closed fracture: Secondary | ICD-10-CM

## 2022-06-29 DIAGNOSIS — Z79899 Other long term (current) drug therapy: Secondary | ICD-10-CM

## 2022-06-29 DIAGNOSIS — F17200 Nicotine dependence, unspecified, uncomplicated: Secondary | ICD-10-CM | POA: Diagnosis not present

## 2022-06-29 DIAGNOSIS — Z86008 Personal history of in-situ neoplasm of other site: Secondary | ICD-10-CM

## 2022-06-29 DIAGNOSIS — S7292XK Unspecified fracture of left femur, subsequent encounter for closed fracture with nonunion: Secondary | ICD-10-CM | POA: Diagnosis not present

## 2022-06-29 DIAGNOSIS — Z471 Aftercare following joint replacement surgery: Secondary | ICD-10-CM | POA: Diagnosis not present

## 2022-06-29 DIAGNOSIS — F02B0 Dementia in other diseases classified elsewhere, moderate, without behavioral disturbance, psychotic disturbance, mood disturbance, and anxiety: Secondary | ICD-10-CM | POA: Diagnosis not present

## 2022-06-29 DIAGNOSIS — T8484XA Pain due to internal orthopedic prosthetic devices, implants and grafts, initial encounter: Secondary | ICD-10-CM | POA: Diagnosis not present

## 2022-06-29 DIAGNOSIS — I129 Hypertensive chronic kidney disease with stage 1 through stage 4 chronic kidney disease, or unspecified chronic kidney disease: Secondary | ICD-10-CM | POA: Diagnosis present

## 2022-06-29 DIAGNOSIS — L89620 Pressure ulcer of left heel, unstageable: Secondary | ICD-10-CM | POA: Diagnosis not present

## 2022-06-29 DIAGNOSIS — L409 Psoriasis, unspecified: Secondary | ICD-10-CM | POA: Diagnosis present

## 2022-06-29 DIAGNOSIS — G20A1 Parkinson's disease without dyskinesia, without mention of fluctuations: Secondary | ICD-10-CM | POA: Diagnosis not present

## 2022-06-29 DIAGNOSIS — E785 Hyperlipidemia, unspecified: Secondary | ICD-10-CM | POA: Diagnosis present

## 2022-06-29 DIAGNOSIS — S72042K Displaced fracture of base of neck of left femur, subsequent encounter for closed fracture with nonunion: Secondary | ICD-10-CM | POA: Diagnosis not present

## 2022-06-29 DIAGNOSIS — Z833 Family history of diabetes mellitus: Secondary | ICD-10-CM

## 2022-06-29 DIAGNOSIS — G20A2 Parkinson's disease without dyskinesia, with fluctuations: Secondary | ICD-10-CM | POA: Diagnosis not present

## 2022-06-29 DIAGNOSIS — R4182 Altered mental status, unspecified: Secondary | ICD-10-CM | POA: Diagnosis not present

## 2022-06-29 DIAGNOSIS — Z66 Do not resuscitate: Secondary | ICD-10-CM | POA: Diagnosis present

## 2022-06-29 DIAGNOSIS — Z955 Presence of coronary angioplasty implant and graft: Secondary | ICD-10-CM | POA: Diagnosis not present

## 2022-06-29 DIAGNOSIS — L89522 Pressure ulcer of left ankle, stage 2: Secondary | ICD-10-CM | POA: Diagnosis not present

## 2022-06-29 DIAGNOSIS — Z9889 Other specified postprocedural states: Secondary | ICD-10-CM | POA: Diagnosis not present

## 2022-06-29 DIAGNOSIS — N529 Male erectile dysfunction, unspecified: Secondary | ICD-10-CM | POA: Diagnosis present

## 2022-06-29 DIAGNOSIS — Z7189 Other specified counseling: Secondary | ICD-10-CM

## 2022-06-29 DIAGNOSIS — Z5986 Financial insecurity: Secondary | ICD-10-CM

## 2022-06-29 DIAGNOSIS — R569 Unspecified convulsions: Secondary | ICD-10-CM | POA: Diagnosis not present

## 2022-06-29 DIAGNOSIS — G8918 Other acute postprocedural pain: Secondary | ICD-10-CM | POA: Diagnosis not present

## 2022-06-29 DIAGNOSIS — R509 Fever, unspecified: Secondary | ICD-10-CM | POA: Diagnosis not present

## 2022-06-29 DIAGNOSIS — Z888 Allergy status to other drugs, medicaments and biological substances status: Secondary | ICD-10-CM

## 2022-06-29 DIAGNOSIS — E782 Mixed hyperlipidemia: Secondary | ICD-10-CM | POA: Diagnosis not present

## 2022-06-29 DIAGNOSIS — E118 Type 2 diabetes mellitus with unspecified complications: Secondary | ICD-10-CM | POA: Diagnosis present

## 2022-06-29 DIAGNOSIS — R410 Disorientation, unspecified: Secondary | ICD-10-CM | POA: Diagnosis not present

## 2022-06-29 DIAGNOSIS — F17290 Nicotine dependence, other tobacco product, uncomplicated: Secondary | ICD-10-CM | POA: Diagnosis present

## 2022-06-29 DIAGNOSIS — Z96642 Presence of left artificial hip joint: Secondary | ICD-10-CM | POA: Diagnosis not present

## 2022-06-29 DIAGNOSIS — F02C0 Dementia in other diseases classified elsewhere, severe, without behavioral disturbance, psychotic disturbance, mood disturbance, and anxiety: Secondary | ICD-10-CM | POA: Diagnosis not present

## 2022-06-29 LAB — CBC WITH DIFFERENTIAL/PLATELET
Abs Immature Granulocytes: 0.07 10*3/uL (ref 0.00–0.07)
Basophils Absolute: 0.1 10*3/uL (ref 0.0–0.1)
Basophils Relative: 0 %
Eosinophils Absolute: 0.2 10*3/uL (ref 0.0–0.5)
Eosinophils Relative: 1 %
HCT: 40.8 % (ref 39.0–52.0)
Hemoglobin: 13 g/dL (ref 13.0–17.0)
Immature Granulocytes: 0 %
Lymphocytes Relative: 10 %
Lymphs Abs: 1.6 10*3/uL (ref 0.7–4.0)
MCH: 30.6 pg (ref 26.0–34.0)
MCHC: 31.9 g/dL (ref 30.0–36.0)
MCV: 96 fL (ref 80.0–100.0)
Monocytes Absolute: 1.1 10*3/uL — ABNORMAL HIGH (ref 0.1–1.0)
Monocytes Relative: 7 %
Neutro Abs: 13.2 10*3/uL — ABNORMAL HIGH (ref 1.7–7.7)
Neutrophils Relative %: 82 %
Platelets: 270 10*3/uL (ref 150–400)
RBC: 4.25 MIL/uL (ref 4.22–5.81)
RDW: 13.2 % (ref 11.5–15.5)
WBC: 16.2 10*3/uL — ABNORMAL HIGH (ref 4.0–10.5)
nRBC: 0 % (ref 0.0–0.2)

## 2022-06-29 LAB — URINALYSIS, ROUTINE W REFLEX MICROSCOPIC
Bilirubin Urine: NEGATIVE
Glucose, UA: NEGATIVE mg/dL
Ketones, ur: NEGATIVE mg/dL
Leukocytes,Ua: NEGATIVE
Nitrite: NEGATIVE
Protein, ur: 100 mg/dL — AB
Specific Gravity, Urine: 1.03 (ref 1.005–1.030)
pH: 5 (ref 5.0–8.0)

## 2022-06-29 LAB — COMPREHENSIVE METABOLIC PANEL
ALT: 16 U/L (ref 0–44)
AST: 35 U/L (ref 15–41)
Albumin: 3.2 g/dL — ABNORMAL LOW (ref 3.5–5.0)
Alkaline Phosphatase: 92 U/L (ref 38–126)
Anion gap: 9 (ref 5–15)
BUN: 28 mg/dL — ABNORMAL HIGH (ref 8–23)
CO2: 25 mmol/L (ref 22–32)
Calcium: 8.9 mg/dL (ref 8.9–10.3)
Chloride: 104 mmol/L (ref 98–111)
Creatinine, Ser: 1.26 mg/dL — ABNORMAL HIGH (ref 0.61–1.24)
GFR, Estimated: 57 mL/min — ABNORMAL LOW (ref >60)
Glucose, Bld: 185 mg/dL — ABNORMAL HIGH (ref 70–99)
Potassium: 4.3 mmol/L (ref 3.5–5.1)
Sodium: 138 mmol/L (ref 135–145)
Total Bilirubin: 0.7 mg/dL (ref 0.3–1.2)
Total Protein: 7 g/dL (ref 6.5–8.1)

## 2022-06-29 LAB — MAGNESIUM: Magnesium: 2.1 mg/dL (ref 1.7–2.4)

## 2022-06-29 LAB — CBG MONITORING, ED
Glucose-Capillary: 193 mg/dL — ABNORMAL HIGH (ref 70–99)
Glucose-Capillary: 129 mg/dL — ABNORMAL HIGH (ref 70–99)

## 2022-06-29 MED ORDER — ONDANSETRON HCL 4 MG PO TABS: 4.0000 mg | ORAL_TABLET | Freq: Four times a day (QID) | ORAL | Status: DC | PRN

## 2022-06-29 MED ORDER — INSULIN ASPART 100 UNIT/ML IJ SOLN
0.0000 [IU] | Freq: Three times a day (TID) | INTRAMUSCULAR | Status: DC
  Administered 2022-06-29: 2 [IU] via SUBCUTANEOUS
  Administered 2022-06-30: 1 [IU] via SUBCUTANEOUS
  Administered 2022-07-01: 3 [IU] via SUBCUTANEOUS
  Administered 2022-07-02 (×2): 2 [IU] via SUBCUTANEOUS
  Administered 2022-07-02: 1 [IU] via SUBCUTANEOUS
  Administered 2022-07-03 (×2): 2 [IU] via SUBCUTANEOUS
  Administered 2022-07-04: 1 [IU] via SUBCUTANEOUS
  Administered 2022-07-04 – 2022-07-05 (×3): 2 [IU] via SUBCUTANEOUS
  Administered 2022-07-05 – 2022-07-06 (×3): 1 [IU] via SUBCUTANEOUS
  Administered 2022-07-06: 2 [IU] via SUBCUTANEOUS
  Filled 2022-06-29: qty 0.09

## 2022-06-29 MED ORDER — MELATONIN 3 MG PO TABS
3.0000 mg | ORAL_TABLET | Freq: Every day | ORAL | Status: DC
  Administered 2022-06-29 – 2022-07-05 (×7): 3 mg via ORAL
  Filled 2022-06-29 (×7): qty 1

## 2022-06-29 MED ORDER — NITROGLYCERIN 0.4 MG SL SUBL: 0.4000 mg | SUBLINGUAL_TABLET | SUBLINGUAL | Status: DC | PRN

## 2022-06-29 MED ORDER — MAGNESIUM CHLORIDE 64 MG PO TBEC
250.0000 mg | DELAYED_RELEASE_TABLET | Freq: Every day | ORAL | Status: DC
  Administered 2022-06-29 – 2022-07-06 (×6): 256 mg via ORAL
  Filled 2022-06-29 (×8): qty 4

## 2022-06-29 MED ORDER — SODIUM CHLORIDE 0.9 % IV SOLN: INTRAVENOUS | Status: DC

## 2022-06-29 MED ORDER — ACETAMINOPHEN 650 MG RE SUPP: 650.0000 mg | Freq: Four times a day (QID) | RECTAL | Status: DC | PRN

## 2022-06-29 MED ORDER — BUPIVACAINE-EPINEPHRINE (PF) 0.5% -1:200000 IJ SOLN
INTRAMUSCULAR | Status: DC | PRN
  Administered 2022-06-29: 30 mL via PERINEURAL

## 2022-06-29 MED ORDER — GLIPIZIDE 5 MG PO TABS
5.0000 mg | ORAL_TABLET | Freq: Two times a day (BID) | ORAL | Status: DC
  Administered 2022-06-29 – 2022-06-30 (×2): 5 mg via ORAL
  Filled 2022-06-29 (×3): qty 1

## 2022-06-29 MED ORDER — ONDANSETRON HCL 4 MG/2ML IJ SOLN: 4.0000 mg | Freq: Four times a day (QID) | INTRAMUSCULAR | Status: DC | PRN

## 2022-06-29 MED ORDER — CARBIDOPA-LEVODOPA 25-100 MG PO TABS
1.0000 | ORAL_TABLET | Freq: Three times a day (TID) | ORAL | Status: DC
  Administered 2022-06-29 – 2022-07-06 (×20): 1 via ORAL
  Filled 2022-06-29 (×20): qty 1

## 2022-06-29 MED ORDER — FENTANYL CITRATE PF 50 MCG/ML IJ SOSY
25.0000 ug | PREFILLED_SYRINGE | Freq: Once | INTRAMUSCULAR | Status: AC
  Administered 2022-06-29: 25 ug via INTRAVENOUS
  Filled 2022-06-29: qty 1

## 2022-06-29 MED ORDER — ACETAMINOPHEN 325 MG PO TABS: 650.0000 mg | ORAL_TABLET | Freq: Four times a day (QID) | ORAL | Status: DC | PRN

## 2022-06-29 MED ORDER — MEMANTINE HCL 10 MG PO TABS
10.0000 mg | ORAL_TABLET | Freq: Two times a day (BID) | ORAL | Status: DC
  Administered 2022-06-29 – 2022-07-06 (×14): 10 mg via ORAL
  Filled 2022-06-29 (×4): qty 1
  Filled 2022-06-29 (×2): qty 2
  Filled 2022-06-29 (×8): qty 1

## 2022-06-29 MED ORDER — PREDNISOLONE ACETATE 1 % OP SUSP
1.0000 [drp] | Freq: Every day | OPHTHALMIC | Status: DC
  Administered 2022-06-30 – 2022-07-06 (×6): 1 [drp] via OPHTHALMIC
  Filled 2022-06-29: qty 5

## 2022-06-29 MED ORDER — FENTANYL CITRATE PF 50 MCG/ML IJ SOSY
50.0000 ug | PREFILLED_SYRINGE | INTRAMUSCULAR | Status: DC
  Administered 2022-06-29: 25 ug via INTRAVENOUS
  Filled 2022-06-29: qty 1

## 2022-06-29 MED ORDER — QUETIAPINE FUMARATE 50 MG PO TABS
75.0000 mg | ORAL_TABLET | Freq: Every day | ORAL | Status: DC
  Administered 2022-06-29 – 2022-07-05 (×7): 75 mg via ORAL
  Filled 2022-06-29 (×6): qty 1
  Filled 2022-06-29: qty 2

## 2022-06-29 MED ORDER — MIDAZOLAM HCL 2 MG/2ML IJ SOLN
1.0000 mg | INTRAMUSCULAR | Status: DC
  Filled 2022-06-29: qty 2

## 2022-06-29 MED ORDER — POLYETHYLENE GLYCOL 3350 17 G PO PACK: 17.0000 g | PACK | Freq: Every day | ORAL | Status: DC | PRN

## 2022-06-29 MED ORDER — HYDROCODONE-ACETAMINOPHEN 5-325 MG PO TABS
1.0000 | ORAL_TABLET | Freq: Four times a day (QID) | ORAL | Status: DC | PRN
  Administered 2022-06-29: 1 via ORAL
  Filled 2022-06-29: qty 1

## 2022-06-29 MED ORDER — VITAMIN B-12 1000 MCG PO TABS
1000.0000 ug | ORAL_TABLET | Freq: Every day | ORAL | Status: DC
  Administered 2022-06-29 – 2022-07-04 (×4): 1000 ug via ORAL
  Filled 2022-06-29 (×5): qty 1

## 2022-06-29 MED ORDER — SENNOSIDES-DOCUSATE SODIUM 8.6-50 MG PO TABS
1.0000 | ORAL_TABLET | Freq: Two times a day (BID) | ORAL | Status: DC
  Administered 2022-06-29 – 2022-06-30 (×3): 1 via ORAL
  Filled 2022-06-29 (×4): qty 1

## 2022-06-29 NOTE — Anesthesia Preprocedure Evaluation (Addendum)
Anesthesia Evaluation  Patient identified by MRN, date of birth, ID band Patient awake and Patient confused    Reviewed: Allergy & Precautions, Patient's Chart, lab work & pertinent test results  History of Anesthesia Complications Negative for: history of anesthetic complications  Airway        Dental   Pulmonary Current Smoker          Cardiovascular hypertension, + CAD, + Past MI and + Cardiac Stents (most recently in 2017)       Neuro/Psych       Dementia Parkinson's dx    GI/Hepatic Neg liver ROS,GERD  ,,  Endo/Other  diabetes, Type 2    Renal/GU Renal InsufficiencyRenal disease (Cr 1.26)  negative genitourinary   Musculoskeletal Left femoral neck fracture   Abdominal   Peds  Hematology negative hematology ROS (+)   Anesthesia Other Findings Day of surgery medications reviewed with patient.  Reproductive/Obstetrics                             Anesthesia Physical Anesthesia Plan  ASA: 3  Anesthesia Plan: Regional   Post-op Pain Management:    Induction:   PONV Risk Score and Plan:   Airway Management Planned:   Additional Equipment:   Intra-op Plan:   Post-operative Plan:   Informed Consent: I have reviewed the patients History and Physical, chart, labs and discussed the procedure including the risks, benefits and alternatives for the proposed anesthesia with the patient or authorized representative who has indicated his/her understanding and acceptance.   Patient has DNR.  Continue DNR and Discussed DNR with power of attorney.     Plan Discussed with:   Anesthesia Plan Comments:         Anesthesia Quick Evaluation

## 2022-06-29 NOTE — ED Triage Notes (Addendum)
Patient got hip surgery on 05-27-2022. Patient has had increased signs of pain over the last 4 days. Per EMS patient has been more retracted per [facility] staff. Unknown LBM. Pressure ulcer noted to left foot. Patient is responsive to voice. Per staff this is patient baseline due to advanced dementia

## 2022-06-29 NOTE — ED Notes (Signed)
Patient transported to X-ray 

## 2022-06-29 NOTE — ED Notes (Signed)
Pt had a bowel movement. Brief changed and new linen applied.

## 2022-06-29 NOTE — ED Provider Notes (Signed)
[place] EMERGENCY DEPARTMENT AT [facility] Provider Note   CSN: [id number] Arrival date & time: 06/29/22  9562     History  Chief Complaint  Patient presents with   Post-op Problem    JAHOD RADDE is a 82 y.o. male.  HPI Patient presents from his nursing facility with staff concerns for apparent increased pain.  Patient has dementia, Parkinson disease, nursing limitations for HPI secondary to this.  Clinic area.  Per chart review, and EMS reports patient had a fall with left femoral neck fracture about 1 month ago.  He has been in his nursing facility with essentially bedbound status since that time, has developed sores on the medial aspect of his right ankle and heel.  Today staff reported concern for increased pain, decreased interactivity.    Home Medications Prior to Admission medications   Medication Sig Start Date End Date Taking? Authorizing Provider  acetaminophen (TYLENOL) 500 MG tablet Take 2 tablets (1,000 mg total) by mouth 3 (three) times daily. 06/01/22  Yes Glade Lloyd, MD  carbidopa-levodopa (SINEMET IR) 25-100 MG tablet Take 1 tablet three times a day with meals 08/27/21  Yes Van Clines, MD  Evolocumab with Infusor (REPATHA PUSHTRONEX SYSTEM) 420 MG/3.5ML SOCT Inject 3.5 mLs into the skin every 30 (thirty) days. 04/06/22  Yes Marykay Lex, MD  glipiZIDE (GLUCOTROL) 5 MG tablet Take 1 tablet (5 mg total) by mouth 2 (two) times daily before a meal. 11/11/21  Yes Nelwyn Salisbury, MD  HYDROcodone-acetaminophen (NORCO/VICODIN) 5-325 MG tablet Take 1 tablet by mouth every 4 (four) hours as needed for moderate pain.   Yes [provider]  LORazepam (ATIVAN) 0.5 MG tablet Take 0.5 mg by mouth 2 (two) times daily.   Yes [provider]  MAGNESIUM CHLORIDE PO Take 250 mg by mouth daily.   Yes [provider]  melatonin 3 MG TABS tablet Take 3 mg by mouth at bedtime.   Yes [provider]  memantine (NAMENDA) 10 MG  tablet Take 1 tablet by mouth twice daily 08/27/21  Yes Van Clines, MD  methocarbamol (ROBAXIN) 500 MG tablet Take 1 tablet (500 mg total) by mouth every 6 (six) hours as needed for muscle spasms. 06/01/22  Yes Glade Lloyd, MD  POTASSIUM GLUCONATE PO Take 99 mg by mouth daily.   Yes [provider]  prednisoLONE acetate (PRED FORTE) 1 % ophthalmic suspension Place 1 drop into the right eye daily.   Yes [provider]  QUEtiapine (SEROQUEL) 50 MG tablet Take 1 tablet (50 mg total) by mouth at bedtime. Patient taking differently: Take 50 mg by mouth at bedtime. Give 1.5 tablet by mouth at bedtime for dementia 06/01/22  Yes Glade Lloyd, MD  senna-docusate (SENOKOT-S) 8.6-50 MG tablet Take 1 tablet by mouth 2 (two) times daily. 06/01/22  Yes Glade Lloyd, MD  vitamin B-12 (CYANOCOBALAMIN) 1000 MCG tablet Take 1,000 mcg by mouth daily.   Yes [provider]  amLODipine (NORVASC) 2.5 MG tablet Take 1 tablet (2.5 mg total) by mouth daily. Patient not taking: Reported on 06/29/2022 01/23/22   Nelwyn Salisbury, MD  enoxaparin (LOVENOX) 40 MG/0.4ML injection Inject 0.4 mLs (40 mg total) into the skin daily for 28 days. Patient not taking: Reported on 06/29/2022 06/01/22 06/29/22  [name], PA-C  Lancets (BD LANCET ULTRAFINE 30G) MISC USE ONE  TO CHECK GLUCOSE ONCE DAILY Patient not taking: Reported on 06/29/2022 03/03/16   Nelwyn Salisbury, MD  nitroGLYCERIN (NITROSTAT) 0.4 MG SL tablet PLACE 1 TABLET UNDER THE TONGUE EVERY 5 MINUTES FOR 3 DOSES AS NEEDED FOR CHEST PAIN Patient not taking: Reported on 06/29/2022 06/09/19   Marykay Lex, MD  [facility] ULTRA test strip USE TO TEST DAILY Patient not taking: Reported on 06/29/2022 04/07/21   Nelwyn Salisbury, MD  polyethylene glycol (MIRALAX / GLYCOLAX) 17 g packet Take 17 g by mouth daily as needed for moderate constipation. Patient not taking: Reported on 06/29/2022 06/01/22   Glade Lloyd, MD      Allergies    Beta adrenergic  blockers, Praluent [alirocumab], Sulfonamide derivatives, and Statins    Review of Systems   Review of Systems  Unable to perform ROS: Dementia    Physical Exam Updated Vital Signs BP 133/76 (BP Location: Right Arm)   Pulse 69   Temp 98.9 F (37.2 C) (Oral)   Resp 18   Ht 5\' 8"  (1.727 m)   Wt 98.5 kg   SpO2 95%   BMI 33.02 kg/m  Physical Exam Vitals and nursing note reviewed.  Constitutional:      General: He is not in acute distress.    Appearance: He is well-developed. He is ill-appearing.     Comments: Chronically ill, deconditioned elderly male resting.  HENT:     Head: Normocephalic and atraumatic.  Eyes:     Conjunctiva/sclera: Conjunctivae normal.  Cardiovascular:     Rate and Rhythm: Normal rate and regular rhythm.  Pulmonary:     Effort: Pulmonary effort is normal. No respiratory distress.     Breath sounds: No stridor.  Abdominal:     General: There is no distension.  Musculoskeletal:       Legs:  Skin:    General: Skin is warm and dry.  Neurological:     Comments: Withdrawn elderly male with contraction, hesitant to range of motion exercises, mumbles speech response to stimuli  Psychiatric:        Cognition and Memory: Cognition is impaired. Memory is impaired.     ED Results / Procedures / Treatments   Labs (all labs ordered are listed, but only abnormal results are displayed) Labs Reviewed  COMPREHENSIVE METABOLIC PANEL - Abnormal; Notable for the following components:      Result Value   Glucose, Bld 185 (*)    BUN 28 (*)    Creatinine, Ser 1.26 (*)    Albumin 3.2 (*)    GFR, Estimated 57 (*)    All other components within normal limits  CBC WITH DIFFERENTIAL/PLATELET - Abnormal; Notable for the following components:   WBC 16.2 (*)    Neutro Abs 13.2 (*)    Monocytes Absolute 1.1 (*)    All other components within normal limits  URINALYSIS, ROUTINE W REFLEX MICROSCOPIC - Abnormal; Notable for the following components:   Hgb urine  dipstick MODERATE (*)    Protein, ur 100 (*)    Bacteria, UA RARE (*)    All other components within normal limits  MAGNESIUM  HEMOGLOBIN A1C    EKG None  Radiology DG Abd 1 View  Result Date: 06/29/2022 CLINICAL DATA:  Pain over the last 4 days. History of hip surgery on 05/27/2022. EXAM: ABDOMEN - 1 VIEW COMPARISON:  None Available. FINDINGS: There is mild gaseous distention of the small and large bowel without convincing evidence of mechanical obstruction. There is no definite free intraperitoneal air, within the confines of supine technique. There is no gross organomegaly or abnormal soft tissue calcification.  Postsurgical changes are noted in the left hip. There is no evidence of acute osseous abnormality. IMPRESSION: Mild gaseous distention of the small and large bowel without convincing evidence of mechanical obstruction. Electronically Signed   By: Lesia Hausen M.D.   On: 06/29/2022 10:12   DG Hip Unilat W or Wo Pelvis 2-3 Views Left  Result Date: 06/29/2022 CLINICAL DATA:  Proximal femoral neck fracture, postoperative follow-up EXAM: DG HIP (WITH OR WITHOUT PELVIS) 2-3V LEFT COMPARISON:  05/27/2022 FINDINGS: Three left intertrochanteric fixation screws reduce the proximal femoral neck fracture. Exam is limited because of positioning. However, there is some degree of impaction/foreshortening of the left femoral neck compared to the right side. Fracture lines are not well demonstrated. IMPRESSION: 1. Left femoral neck fracture status post ORIF as above. 2. Limited exam because of positioning. Electronically Signed   By: Judie Petit.  Shick M.D.   On: 06/29/2022 10:12   DG Chest 1 View  Result Date: 06/29/2022 CLINICAL DATA:  Provided history: Pain. EXAM: CHEST  1 VIEW COMPARISON:  Chest radiographs 05/25/2022 and are FINDINGS: Shallow inspiration radiograph. Heart size at the upper limits of normal. No appreciable airspace consolidation or pulmonary edema. No evidence of pleural effusion or  pneumothorax. No acute osseous abnormality identified. IMPRESSION: 1. Shallow inspiration radiograph. 2.  No evidence of acute cardiopulmonary abnormality. Electronically Signed   By: Jackey Loge D.O.   On: 06/29/2022 10:02    Procedures Procedures    Medications Ordered in ED Medications  0.9 %  sodium chloride infusion ( Intravenous New Bag/Given 06/29/22 1006)  HYDROcodone-acetaminophen (NORCO/VICODIN) 5-325 MG per tablet 1 tablet (has no administration in time range)  acetaminophen (TYLENOL) tablet 650 mg (has no administration in time range)    Or  acetaminophen (TYLENOL) suppository 650 mg (has no administration in time range)  ondansetron (ZOFRAN) tablet 4 mg (has no administration in time range)    Or  ondansetron (ZOFRAN) injection 4 mg (has no administration in time range)  insulin aspart (novoLOG) injection 0-9 Units (has no administration in time range)  fentaNYL (SUBLIMAZE) injection 25 mcg (25 mcg Intravenous Given 06/29/22 1157)    ED Course/ Medical Decision Making/ A&P                             Medical Decision Making History of Parkinson's, dementia, fall including with fracture presents with facility concerns pain, decreased interactivity.  History is also notable constipation constipation, postop pain, nausea , Patient had x-rays, labs, placed on.  Cardiac 65 sinus normal Pulse ox 100% room air normal   Amount and/or Complexity of Data Reviewed Independent Historian: EMS External Data Reviewed: notes. Labs: ordered. Decision-making details documented in ED Course. Radiology: ordered and independent interpretation performed. Decision-making details documented in ED Course.  Risk Prescription drug management. Decision regarding hospitalization.  Per chart review HPI/Patient Profile:  82 y.o. male  with past medical history of dementia, Parkinson's, CAD, CKD, and DM admitted on 05/25/2022 after a fall at home resulting in left hip fracture.  Patient underwent  surgical repair on 4/3.  Patient with ongoing confusion worse than baseline following surgery.  PMT consulted to discuss goals of care.    Patient required additional analgesics. Subsequently he was much more calm.  Patient mentioned to his wife.  With her bedside we discussed the patient's history, recent course, and she notes that the patient is recovering generally well about 4 days ago when he started complaining of  pain.  Since that time he has been intolerant of movement, secondary to pain.  I reviewed the patient's x-ray at bedside with patient's wife, after having discussed with his orthopedic team.  Evidence on x-ray for failure, prior hip surgery devices, with collapse of the femoral.  I discussed patient's case with our internal medicine colleagues, given concern for pain, fracture,        Final Clinical Impression(s) / ED Diagnoses Final diagnoses:  Acute postoperative pain of left hip    Rx / DC Orders ED Discharge Orders     None         Gerhard Munch, MD 06/29/22 1218

## 2022-06-29 NOTE — Consult Note (Signed)
ORTHOPAEDIC CONSULTATION  REQUESTING PHYSICIAN: Bobette Mo, MD  Chief Complaint: left hip pain  HPI: Joe Murray is a 82 y.o. male with history of Alzheimer's dementia, Parkinson's disease, CAD, sinus bradycardia, type 2 diabetes, diverticulosis, hyperlipidemia, GERD, insomnia,  vertigo who was brought to the [facility] due to increasing left hip pain. He underwent a left femoral neck percutaneous pinning with Dr. Blanchie Dessert on 05/27/22 and was discharged to [facility]. Patient unable to provide further history due to dementia. Patient was walking with a walker at baseline prior to original injury, but since most recent surgery was able able to walk a few steps with a walker but with poor impulse control. Daughter and wife in the room state that there has been a noticeable decline in patient's cognitive status since original surgery.   Past Medical History:  Diagnosis Date   Alzheimer's dementia [facility])    sees Dr. Patrcia Dolly   CAD S/P percutaneous coronary angioplasty 10/'03; 3/'09; 7/*09; 1/'17    a. Dr. Herbie Baltimore; '03 - Cypher DES 3.0 mm 33 mm proximal-mid LAD (details D1 with ostial 60%); 3/'09 dRCA 2.75 mm x 13 mm Cypher DES (2.8 mm); 7/'09 pRCA 3.0 mm x 18 m Cypher DES (3.25 mm);; (2009) 2D Echo - EF 55%, (November 2014) nonischemic Myoview;; b. Promus DES to RCA and mid LAD 02/26/2015   Chronotropic incompetence 12/2011   ETT 11/2017: Normal blood pressure response.  No EKG changes.  Exercise stopped due to fatigue and dyspnea.  Heart rate increased to low 90s from 50s.  Suggest chronotropic incompetence.  Severely impaired exercise capacity. -->  Results reviewed with electrophysiology: Still not indication for pacemaker.   Diabetes mellitus type 2 in obese    CAD   Diverticulosis    Dyslipidemia, goal LDL below 70    on [employer] study medicaion.   ED (erectile dysfunction)    GERD (gastroesophageal reflux disease)    Hemorrhoids 07/2002   Internal and External   Insomnia     Nodular basal cell carcinoma (BCC) 05/08/2019   left ear (cx40fu)   Non-STEMI (non-ST elevated myocardial infarction) (HCC) 11/2001   a. Proximal LAD tandem lesions -- long DES stent covering both;; b. Jan 2017: mRCA PCI, mLAD PCI   Parkinson's disease    sees Dr. Patrcia Dolly   Persistent sinus bradycardia    Personal history of colonic adenomas 09/06/2012   Psoriasis    Resting HR in 54s   SCC (squamous cell carcinoma) 11/13/2001   right upper outer arm (txpbx)   SCC (squamous cell carcinoma) 11/13/2009   right upper ear rim (cx3 73fu)   SCC (squamous cell carcinoma) 01/20/2010   right nose (cx49fu)   SCC (squamous cell carcinoma) 03/28/2014   right side of nose ( cx77fu)   SCC (squamous cell carcinoma) 06/03/2016   right side of nose   SCC (squamous cell carcinoma) 05/08/2019   right ear inferior (cx27fu) cis   SCCA (squamous cell carcinoma) of skin 04/23/2021   Right Temporal Scalp (well diff) (tx p bx)   Spasm of esophagus    Squamous cell carcinoma in situ (SCCIS) 05/08/2019   Right Ear Inferior   Squamous cell carcinoma of skin 11/08/2001   top right rim ear    SVT (supraventricular tachycardia) 2003   Vertigo, benign positional    Past Surgical History:  Procedure Laterality Date   CARDIAC CATHETERIZATION N/A 02/26/2015   Procedure: Left Heart Cath and Coronary Angiography;  Surgeon: Marykay Lex, MD: Tyrone Schimke  99% --> PCI. mLAD 75%-> FFR Guided PCI, Mod ISR in pLAD & pRCA   CARDIAC CATHETERIZATION N/A 02/26/2015   Procedure: Coronary Stent Intervention;  Surgeon: Marykay Lex, MD;  Location: [facility] INVASIVE CV LAB: mRCA Promus Premier DES 3.0 x 12 (3.5), mLAD Promus Premier DES 2.75 x 20 (3.0)   CARDIAC CATHETERIZATION N/A 02/26/2015   Procedure: Intravascular Pressure Wire/FFR Study;  Surgeon: Marykay Lex, MD;  Location: [facility] INVASIVE CV LAB;  Service: Cardiovascular: LAD 75% - FFR + -> PCI   CATARACT EXTRACTION  [id number]   COLONOSCOPY  12/16/2017   per Dr. Leone Payor,  adenomatous polyps, no repeats due to age    CORONARY ANGIOPLASTY WITH STENT PLACEMENT  LAD - 2003, RCA 3 & 7/ '09   Cypher 3.0 x 32 mid LAD; Cyper 2.75 x 13 - distal RCA, 3.0 x 18 Prox RCA   ESOPHAGOGASTRODUODENOSCOPY  08-11-02   esophageal dilation per Dr. Leone Payor   HERNIA REPAIR     right inguinal    INTRAMEDULLARY (IM) NAIL INTERTROCHANTERIC  05/27/2022   Procedure: Cannulated screw fixation of left valgus impacted femoral neck fracture;  Surgeon: Joen Laura, MD;  Location: [facility] ORS;  Service: Orthopedics;;   NM MYOVIEW LTD  Nov 2014   ~8 METS; EF 60%, no ischemia or infarction   PERCUTANEOUS CORONARY STENT INTERVENTION (PCI-S)  11/23/2001   NSTEMI: Prox-Mid LAD tandem ~80% lesions on either side of D1 (with 80% lesion) -- Cypher DES 3.0 mm x 33 m (covering both lesions)    PERCUTANEOUS CORONARY STENT INTERVENTION (PCI-S)  04/27/2007   Unstable Angina: Distal RCA 95%: Cypher 2.75x13  (2.8 mm); residual focal ~60%ISR in LAD stent, 60% D1 ostial (no PTCA on LAD due to no ischemia on ST)   PERCUTANEOUS CORONARY STENT INTERVENTION (PCI-S)  09/21/2007   Bradycardia & Unstable Angina: Prox RCA 70% - PCI Cypher DES 3.0 mm x 18 mm  (3.25 mm); ostial 60-70% jailed D1. LAD & distal RCA stents patent   SHOULDER SURGERY     left rotator cuff, Dr. Darrelyn Hillock   TRANSTHORACIC ECHOCARDIOGRAM  March 2009   Normal LV size and function, EF 55%. Mild MR; mild RV dilation.   TRANSTHORACIC ECHOCARDIOGRAM  02/2015   EF 60-65%. Moderate concentric LVH. Normal function with normal regional wall motion. GR 1 DD   WRIST FRACTURE SURGERY     right   Social History   Socioeconomic History   Marital status: Married    Spouse name: Not on file   Number of children: 4   Years of education: Not on file   Highest education level: Not on file  Occupational History   Occupation: retired  Tobacco Use   Smoking status: Every Day    Types: Cigars    Start date: 07/06/1952   Smokeless tobacco: Never   Tobacco  comments:    1-2 per day; he says "I really don't inhale"  Vaping Use   Vaping Use: Never used  Substance and Sexual Activity   Alcohol use: Yes    Alcohol/week: 0.0 standard drinks of alcohol    Comment: occ   Drug use: No   Sexual activity: Never  Other Topics Concern   Not on file  Social History Narrative   He is a married, father of 3, grandfather 3.   He still smokes 2-3 cigars per day. States that he "does not really inhale ". He is not really all that it's including, stating that he wants to have his  1 remaining vice.   Exercises only on occasion.   He does various landscaping jobs including cutting wood, and clearing brush.      Pt is right handed   Lives in 2 story home with his wife   High school graduate   Retired Curator   Social Determinants of Health   Financial Resource Strain: Low Risk  (03/30/2022)   Overall Financial Resource Strain (CARDIA)    Difficulty of Paying Living Expenses: Not hard at all  Recent Concern: Financial Resource Strain - Medium Risk (01/06/2022)   Overall Financial Resource Strain (CARDIA)    Difficulty of Paying Living Expenses: Somewhat hard  Food Insecurity: No Food Insecurity (05/26/2022)   Hunger Vital Sign    Worried About Running Out of Food in the Last Year: Never true    Ran Out of Food in the Last Year: Never true  Transportation Needs: No Transportation Needs (05/26/2022)   PRAPARE - Administrator, Civil Service (Medical): No    Lack of Transportation (Non-Medical): No  Physical Activity: Inactive (03/30/2022)   Exercise Vital Sign    Days of Exercise per Week: 0 days    Minutes of Exercise per Session: 0 min  Stress: No Stress Concern Present (03/30/2022)   [employer] of Occupational Health - Occupational Stress Questionnaire    Feeling of Stress : Not at all  Social Connections: Socially Integrated (03/30/2022)   Social Connection and Isolation Panel [NHANES]    Frequency of Communication with Friends and  Family: More than three times a week    Frequency of Social Gatherings with Friends and Family: More than three times a week    Attends Religious Services: More than 4 times per year    Active Member of [employer] or Organizations: Yes    Attends Engineer, structural: More than 4 times per year    Marital Status: Married   Family History  Problem Relation Age of Onset   Diabetes Mother    Hearing loss Father    Dementia Father    Heart attack Father 52       first MI prior to age 75   Heart attack Brother    Allergies  Allergen Reactions   Beta Adrenergic Blockers Other (See Comments)    chronotropic incompetence   Praluent [Alirocumab]     Diffuse Rash - improved when stopping & taking Benadryl   Sulfonamide Derivatives     unknown   Statins Other (See Comments)    REACTION: myalgias     Positive ROS: All other systems have been reviewed and were otherwise negative with the exception of those mentioned in the HPI and as above.  Physical Exam: General: Somnolent, no acute distress Cardiovascular: No pedal edema Respiratory: No cyanosis, no use of accessory musculature GI: No organomegaly, abdomen is soft and non-tender Skin: No lesions in the area of chief complaint Neurologic: Sensation intact distally Psychiatric: Does not respond to verbal stimuli.  Lymphatic: No axillary or cervical lymphadenopathy  MUSCULOSKELETAL: left leg contracted and internally rotated. Moans in pain with any passive external rotation at the hip. Ulceration to medial left foot with mild drainage. Foot warm and well perfused.  Imaging: x-rays of left hip show impaction/shortening of femoral neck after percutaneous pinning  Assessment/Plan: Left femoral neck fracture s/p left femoral neck pinning with new pain at shortening at fracture site - Discussed x-rays findings as well as surgical vs. Non-surgical options with wife and daughter in the room. Family  is very hesitant to move forward with  more surgery due to patients significant cognitive decline after last hip surgery. They would like to talk to Dr. Blanchie Dessert tomorrow about their options and further conversations will be had regarding operative vs. Non-operative treatment. Patient is tentatively posted for a conversion to total hip replacement on 5/8, however, this plan may change based on further conversations with family.    Armida Sans, PA-C   06/29/2022 7:53 PM

## 2022-06-29 NOTE — ED Notes (Signed)
Patient transported to PACU.

## 2022-06-29 NOTE — Anesthesia Procedure Notes (Addendum)
Anesthesia Regional Block: Femoral nerve block   Pre-Anesthetic Checklist: , timeout performed,  Correct Patient, Correct Site, Correct Laterality,  Correct Procedure, Correct Position, site marked,  Risks and benefits discussed,  Pre-op evaluation,  At surgeon's request and post-op pain management  Laterality: Left  Prep: Maximum Sterile Barrier Precautions used, chloraprep       Needles:  Injection technique: Single-shot  Needle Type: Echogenic Stimulator Needle     Needle Length: 9cm  Needle Gauge: 22     Additional Needles:   Procedures:,,,, ultrasound used (permanent image in chart),,    Narrative:  Start time: 06/29/2022 2:22 PM End time: 06/29/2022 2:25 PM Injection made incrementally with aspirations every 5 mL.  Performed by: Personally  Anesthesiologist: Kaylyn Layer, MD  Additional Notes: Risks, benefits, and alternative discussed. Patient gave consent for procedure. Patient prepped and draped in sterile fashion. Sedation administered, patient remains easily responsive to voice. Relevant anatomy identified with ultrasound guidance. Local anesthetic given in 5cc increments with no signs or symptoms of intravascular injection. No pain or paraesthesias with injection. Patient monitored throughout procedure with signs of LAST or immediate complications. Tolerated well. Ultrasound image placed in chart.  Joe Greenhouse, MD

## 2022-06-29 NOTE — H&P (Signed)
History and Physical    Patient: Joe Murray:[id number] DOB: May 09, 1940 DOA: 06/29/2022 DOS: the patient was seen and examined on 06/29/2022 PCP: Nelwyn Salisbury, MD  Patient coming from: Home  Chief Complaint:  Chief Complaint  Patient presents with   Post-op Problem   HPI: Joe Murray is a 82 y.o. male with medical history significant of Alzheimer's dementia, CAD, status post percutaneous coronary angioplasty, persistent sinus bradycardia, history of SVT, type 2 diabetes, diverticulosis, hyperlipidemia, ED, GERD, internal and external hemorrhoids, insomnia, nodular basal cell carcinoma, Parkinson's disease, psoriasis, vertigo who underwent left hip fracture repair on 05/27/2022, subsequently discharged to [facility] for rehab who was brought to the emergency department after being retracted in bed due to worsening pain over the last 4 days.  He is unable to provide further history due to dementia.  Lab work: His urinalysis showed moderate hemoglobinuria with proteinuria 100 mg deciliter and rare bacteria.  CBC showed a white count of 16.2 with 82% neutrophils, hemoglobin 13.0 g/dL platelets 811.  CMP showed a glucose of 180, BUN 28 creatinine 1.26 mg/dL and albumin of 3.2 g/dL.  The rest of the CMP measurements were normal.  Imaging: Left hip x-ray shows left femoral neck fracture status post ORIF with some degree of impaction/foreshortening of the left femoral neck compared to the right side.  Fracture lines are not well-demonstrated. 1 view abdominal x-ray with mild gaseous distention of the small and large bowel without convincing evidence of mechanical obstruction.  Portable 1 view chest radiograph with shallow inspiration but no evidence of acute cardiopulmonary abnormality.  ED course: Initial vital signs were temperature 98.9 F, pulse 65, respiration 26, BP 139/67 mmHg O2 sat 96% on room air.  The patient received fentanyl 25 mcg IVP x 1.   Review of Systems: As mentioned  in the history of present illness. All other systems reviewed and are negative.  Past Medical History:  Diagnosis Date   Alzheimer's dementia [facility])    sees Dr. Patrcia Dolly   CAD S/P percutaneous coronary angioplasty 10/'03; 3/'09; 7/*09; 1/'17    a. Dr. Herbie Baltimore; '03 - Cypher DES 3.0 mm 33 mm proximal-mid LAD (details D1 with ostial 60%); 3/'09 dRCA 2.75 mm x 13 mm Cypher DES (2.8 mm); 7/'09 pRCA 3.0 mm x 18 m Cypher DES (3.25 mm);; (2009) 2D Echo - EF 55%, (November 2014) nonischemic Myoview;; b. Promus DES to RCA and mid LAD 02/26/2015   Chronotropic incompetence 12/2011   ETT 11/2017: Normal blood pressure response.  No EKG changes.  Exercise stopped due to fatigue and dyspnea.  Heart rate increased to low 90s from 50s.  Suggest chronotropic incompetence.  Severely impaired exercise capacity. -->  Results reviewed with electrophysiology: Still not indication for pacemaker.   Diabetes mellitus type 2 in obese    CAD   Diverticulosis    Dyslipidemia, goal LDL below 70    on [employer] study medicaion.   ED (erectile dysfunction)    GERD (gastroesophageal reflux disease)    Hemorrhoids 07/2002   Internal and External   Insomnia    Nodular basal cell carcinoma (BCC) 05/08/2019   left ear (cx60fu)   Non-STEMI (non-ST elevated myocardial infarction) (HCC) 11/2001   a. Proximal LAD tandem lesions -- long DES stent covering both;; b. Jan 2017: mRCA PCI, mLAD PCI   Parkinson's disease    sees Dr. Patrcia Dolly   Persistent sinus bradycardia    Personal history of colonic adenomas 09/06/2012   Psoriasis  Resting HR in 54s   SCC (squamous cell carcinoma) 11/13/2001   right upper outer arm (txpbx)   SCC (squamous cell carcinoma) 11/13/2009   right upper ear rim (cx3 63fu)   SCC (squamous cell carcinoma) 01/20/2010   right nose (cx56fu)   SCC (squamous cell carcinoma) 03/28/2014   right side of nose ( cx63fu)   SCC (squamous cell carcinoma) 06/03/2016   right side of nose   SCC (squamous  cell carcinoma) 05/08/2019   right ear inferior (cx39fu) cis   SCCA (squamous cell carcinoma) of skin 04/23/2021   Right Temporal Scalp (well diff) (tx p bx)   Spasm of esophagus    Squamous cell carcinoma in situ (SCCIS) 05/08/2019   Right Ear Inferior   Squamous cell carcinoma of skin 11/08/2001   top right rim ear    SVT (supraventricular tachycardia) 2003   Vertigo, benign positional    Past Surgical History:  Procedure Laterality Date   CARDIAC CATHETERIZATION N/A 02/26/2015   Procedure: Left Heart Cath and Coronary Angiography;  Surgeon: Marykay Lex, MD: mRCA 99% --> PCI. mLAD 75%-> FFR Guided PCI, Mod ISR in pLAD & pRCA   CARDIAC CATHETERIZATION N/A 02/26/2015   Procedure: Coronary Stent Intervention;  Surgeon: Marykay Lex, MD;  Location: [facility] INVASIVE CV LAB: mRCA Promus Premier DES 3.0 x 12 (3.5), mLAD Promus Premier DES 2.75 x 20 (3.0)   CARDIAC CATHETERIZATION N/A 02/26/2015   Procedure: Intravascular Pressure Wire/FFR Study;  Surgeon: Marykay Lex, MD;  Location: [facility] INVASIVE CV LAB;  Service: Cardiovascular: LAD 75% - FFR + -> PCI   CATARACT EXTRACTION  [id number]   COLONOSCOPY  12/16/2017   per Dr. Leone Payor, adenomatous polyps, no repeats due to age    CORONARY ANGIOPLASTY WITH STENT PLACEMENT  LAD - 2003, RCA 3 & 7/ '09   Cypher 3.0 x 32 mid LAD; Cyper 2.75 x 13 - distal RCA, 3.0 x 18 Prox RCA   ESOPHAGOGASTRODUODENOSCOPY  08-11-02   esophageal dilation per Dr. Leone Payor   HERNIA REPAIR     right inguinal    INTRAMEDULLARY (IM) NAIL INTERTROCHANTERIC  05/27/2022   Procedure: Cannulated screw fixation of left valgus impacted femoral neck fracture;  Surgeon: Joen Laura, MD;  Location: [facility] ORS;  Service: Orthopedics;;   NM MYOVIEW LTD  Nov 2014   ~8 METS; EF 60%, no ischemia or infarction   PERCUTANEOUS CORONARY STENT INTERVENTION (PCI-S)  11/23/2001   NSTEMI: Prox-Mid LAD tandem ~80% lesions on either side of D1 (with 80% lesion) -- Cypher DES 3.0 mm x 33 m (covering  both lesions)    PERCUTANEOUS CORONARY STENT INTERVENTION (PCI-S)  04/27/2007   Unstable Angina: Distal RCA 95%: Cypher 2.75x13  (2.8 mm); residual focal ~60%ISR in LAD stent, 60% D1 ostial (no PTCA on LAD due to no ischemia on ST)   PERCUTANEOUS CORONARY STENT INTERVENTION (PCI-S)  09/21/2007   Bradycardia & Unstable Angina: Prox RCA 70% - PCI Cypher DES 3.0 mm x 18 mm  (3.25 mm); ostial 60-70% jailed D1. LAD & distal RCA stents patent   SHOULDER SURGERY     left rotator cuff, Dr. Darrelyn Hillock   TRANSTHORACIC ECHOCARDIOGRAM  March 2009   Normal LV size and function, EF 55%. Mild MR; mild RV dilation.   TRANSTHORACIC ECHOCARDIOGRAM  02/2015   EF 60-65%. Moderate concentric LVH. Normal function with normal regional wall motion. GR 1 DD   WRIST FRACTURE SURGERY     right   Social History:  reports  that he has been smoking cigars. He started smoking about 70 years ago. He has never used smokeless tobacco. He reports current alcohol use. He reports that he does not use drugs.  Allergies  Allergen Reactions   Beta Adrenergic Blockers Other (See Comments)    chronotropic incompetence   Praluent [Alirocumab]     Diffuse Rash - improved when stopping & taking Benadryl   Sulfonamide Derivatives     unknown   Statins Other (See Comments)    REACTION: myalgias    Family History  Problem Relation Age of Onset   Diabetes Mother    Hearing loss Father    Dementia Father    Heart attack Father 26       first MI prior to age 25   Heart attack Brother     Prior to Admission medications   Medication Sig Start Date End Date Taking? Authorizing Provider  acetaminophen (TYLENOL) 500 MG tablet Take 2 tablets (1,000 mg total) by mouth 3 (three) times daily. 06/01/22  Yes Glade Lloyd, MD  carbidopa-levodopa (SINEMET IR) 25-100 MG tablet Take 1 tablet three times a day with meals 08/27/21  Yes Van Clines, MD  Evolocumab with Infusor (REPATHA PUSHTRONEX SYSTEM) 420 MG/3.5ML SOCT Inject 3.5 mLs into the  skin every 30 (thirty) days. 04/06/22  Yes Marykay Lex, MD  glipiZIDE (GLUCOTROL) 5 MG tablet Take 1 tablet (5 mg total) by mouth 2 (two) times daily before a meal. 11/11/21  Yes Nelwyn Salisbury, MD  HYDROcodone-acetaminophen (NORCO/VICODIN) 5-325 MG tablet Take 1 tablet by mouth every 4 (four) hours as needed for moderate pain.   Yes [provider]  LORazepam (ATIVAN) 0.5 MG tablet Take 0.5 mg by mouth 2 (two) times daily.   Yes [provider]  MAGNESIUM CHLORIDE PO Take 250 mg by mouth daily.   Yes [provider]  melatonin 3 MG TABS tablet Take 3 mg by mouth at bedtime.   Yes [provider]  memantine (NAMENDA) 10 MG tablet Take 1 tablet by mouth twice daily 08/27/21  Yes Van Clines, MD  methocarbamol (ROBAXIN) 500 MG tablet Take 1 tablet (500 mg total) by mouth every 6 (six) hours as needed for muscle spasms. 06/01/22  Yes Glade Lloyd, MD  POTASSIUM GLUCONATE PO Take 99 mg by mouth daily.   Yes [provider]  prednisoLONE acetate (PRED FORTE) 1 % ophthalmic suspension Place 1 drop into the right eye daily.   Yes [provider]  QUEtiapine (SEROQUEL) 50 MG tablet Take 1 tablet (50 mg total) by mouth at bedtime. Patient taking differently: Take 50 mg by mouth at bedtime. Give 1.5 tablet by mouth at bedtime for dementia 06/01/22  Yes Glade Lloyd, MD  senna-docusate (SENOKOT-S) 8.6-50 MG tablet Take 1 tablet by mouth 2 (two) times daily. 06/01/22  Yes Glade Lloyd, MD  vitamin B-12 (CYANOCOBALAMIN) 1000 MCG tablet Take 1,000 mcg by mouth daily.   Yes [provider]  amLODipine (NORVASC) 2.5 MG tablet Take 1 tablet (2.5 mg total) by mouth daily. Patient not taking: Reported on 06/29/2022 01/23/22   Nelwyn Salisbury, MD  enoxaparin (LOVENOX) 40 MG/0.4ML injection Inject 0.4 mLs (40 mg total) into the skin daily for 28 days. Patient not taking: Reported on 06/29/2022 06/01/22 06/29/22  [name], PA-C  Lancets (BD LANCET  ULTRAFINE 30G) MISC USE ONE  TO CHECK GLUCOSE ONCE DAILY Patient not taking: Reported on 06/29/2022 03/03/16   Nelwyn Salisbury, MD  nitroGLYCERIN (NITROSTAT) 0.4 MG SL tablet PLACE 1 TABLET UNDER THE TONGUE EVERY 5 MINUTES FOR 3 DOSES AS NEEDED FOR CHEST PAIN Patient not taking: Reported on 06/29/2022 06/09/19   Marykay Lex, MD  [facility] ULTRA test strip USE TO TEST DAILY Patient not taking: Reported on 06/29/2022 04/07/21   Nelwyn Salisbury, MD  polyethylene glycol (MIRALAX / GLYCOLAX) 17 g packet Take 17 g by mouth daily as needed for moderate constipation. Patient not taking: Reported on 06/29/2022 06/01/22   Glade Lloyd, MD    Physical Exam: Vitals:   06/29/22 0846 06/29/22 0855 06/29/22 1000 06/29/22 1115  BP: 139/67  (!) 144/76 133/76  Pulse: 65  68 69  Resp: (!) 26  15 18   Temp: 98.9 F (37.2 C)     TempSrc: Oral     SpO2: 96%  95% 95%  Weight:  98.5 kg    Height:  5\' 8"  (1.727 m)     Physical Exam Vitals and nursing note reviewed.  Constitutional:      General: He is awake. He is not in acute distress. HENT:     Head: Normocephalic.     Nose: No rhinorrhea.     Mouth/Throat:     Mouth: Mucous membranes are dry.  Eyes:     General: No scleral icterus.    Pupils: Pupils are equal, round, and reactive to light.  Neck:     Vascular: No JVD.  Cardiovascular:     Rate and Rhythm: Normal rate and regular rhythm.     Heart sounds: S1 normal and S2 normal.  Pulmonary:     Effort: Pulmonary effort is normal.     Breath sounds: Normal breath sounds. No wheezing, rhonchi or rales.  Abdominal:     General: Bowel sounds are normal.     Palpations: Abdomen is soft.  Musculoskeletal:     Cervical back: Neck supple.     Left hip: Tenderness present. Decreased range of motion.     Right lower leg: No edema.     Left lower leg: No edema.  Skin:    General: Skin is warm and dry.  Neurological:     General: No focal deficit present.     Mental Status: He is alert. Mental status is at  baseline.  Psychiatric:        Mood and Affect: Mood normal.        Behavior: Behavior is cooperative.     Comments: Response to verbal stimuli.   Data Reviewed:  There are no new results to review at this time.  Assessment and Plan: Principal Problem:   Closed left hip fracture, sequela Admit to MedSurg/inpatient. Ice area as needed. Buck's traction per protocol. Analgesics as needed. Antiemetics as needed. Consult TOC team. Consult nutritional services. PT evaluation after surgery. Orthopedic surgery will consult.  Active Problems:   CAD S/P percutaneous coronary angioplasty  - LADx2, & RCA x 3 No complaints of chest pain.   No EKG changes. Currently not on medications.    GERD Antiacid, H2 blocker or PPI as needed.    DM (diabetes mellitus), type 2 with complications - CAD Carbohydrate modified diet. Continue glipizide 5 mg p.o. twice daily. CBG monitoring before meals and bedtime with RI SS.    Dyslipidemia, goal LDL below 70 No longer on statin. Follow-up with primary.    Essential hypertension  Medications were held due to orthostatic dizziness. Monitor blood pressure closely.    Parkinson's disease Continue Sinemet IR  100/25 mg one tablet p.o. 3 times daily.    Alzheimer's dementia (HCC) Supportive care. Continue memantine 10 mg p.o. twice daily. Continue quetiapine 75 mg p.o. bedtime.    Chronic kidney disease, stage 3a (HCC) Renal function similar to baseline. Monitor GFR and electrolytes.     Advance Care Planning:   Code Status: DNR   Consults: Orthopedic surgery Weber Cooks, MD)  Family Communication: His spouse was at bedside.  Severity of Illness: The appropriate patient status for this patient is INPATIENT. Inpatient status is judged to be reasonable and necessary in order to provide the required intensity of service to ensure the patient's safety. The patient's presenting symptoms, physical exam findings, and initial  radiographic and laboratory data in the context of their chronic comorbidities is felt to place them at high risk for further clinical deterioration. Furthermore, it is not anticipated that the patient will be medically stable for discharge from the hospital within 2 midnights of admission.   * I certify that at the point of admission it is my clinical judgment that the patient will require inpatient hospital care spanning beyond 2 midnights from the point of admission due to high intensity of service, high risk for further deterioration and high frequency of surveillance required.*  Author: Bobette Mo, MD 06/29/2022 11:55 AM  For on call review [email or web address].   This document was prepared using Dragon voice recognition software and may contain some unintended transcription errors.

## 2022-06-30 ENCOUNTER — Inpatient Hospital Stay (HOSPITAL_COMMUNITY): Payer: Medicare HMO

## 2022-06-30 ENCOUNTER — Encounter (HOSPITAL_COMMUNITY): Payer: Self-pay | Admitting: Radiology

## 2022-06-30 ENCOUNTER — Inpatient Hospital Stay (HOSPITAL_COMMUNITY)
Admit: 2022-06-30 | Discharge: 2022-06-30 | Disposition: A | Discharge status: Home / Self Care | Payer: Medicare HMO | Attending: Internal Medicine | Admitting: Internal Medicine

## 2022-06-30 DIAGNOSIS — N1831 Chronic kidney disease, stage 3a: Secondary | ICD-10-CM | POA: Diagnosis not present

## 2022-06-30 DIAGNOSIS — R569 Unspecified convulsions: Secondary | ICD-10-CM

## 2022-06-30 DIAGNOSIS — E1122 Type 2 diabetes mellitus with diabetic chronic kidney disease: Secondary | ICD-10-CM

## 2022-06-30 DIAGNOSIS — R4182 Altered mental status, unspecified: Secondary | ICD-10-CM

## 2022-06-30 DIAGNOSIS — S72142A Displaced intertrochanteric fracture of left femur, initial encounter for closed fracture: Secondary | ICD-10-CM | POA: Diagnosis not present

## 2022-06-30 DIAGNOSIS — G20A1 Parkinson's disease without dyskinesia, without mention of fluctuations: Secondary | ICD-10-CM

## 2022-06-30 DIAGNOSIS — G308 Other Alzheimer's disease: Secondary | ICD-10-CM | POA: Diagnosis not present

## 2022-06-30 DIAGNOSIS — Z7189 Other specified counseling: Secondary | ICD-10-CM

## 2022-06-30 DIAGNOSIS — L89522 Pressure ulcer of left ankle, stage 2: Secondary | ICD-10-CM

## 2022-06-30 DIAGNOSIS — I2510 Atherosclerotic heart disease of native coronary artery without angina pectoris: Secondary | ICD-10-CM

## 2022-06-30 DIAGNOSIS — E782 Mixed hyperlipidemia: Secondary | ICD-10-CM

## 2022-06-30 DIAGNOSIS — F02C0 Dementia in other diseases classified elsewhere, severe, without behavioral disturbance, psychotic disturbance, mood disturbance, and anxiety: Secondary | ICD-10-CM

## 2022-06-30 DIAGNOSIS — L89620 Pressure ulcer of left heel, unstageable: Secondary | ICD-10-CM

## 2022-06-30 DIAGNOSIS — E1169 Type 2 diabetes mellitus with other specified complication: Secondary | ICD-10-CM

## 2022-06-30 DIAGNOSIS — E538 Deficiency of other specified B group vitamins: Secondary | ICD-10-CM

## 2022-06-30 LAB — BLOOD GAS, VENOUS
Acid-base deficit: 0.2 mmol/L (ref 0.0–2.0)
Bicarbonate: 24 mmol/L (ref 20.0–28.0)
O2 Saturation: 90.2 %
Patient temperature: 36.6
pCO2, Ven: 36 mmHg — ABNORMAL LOW (ref 44–60)
pH, Ven: 7.43 (ref 7.25–7.43)
pO2, Ven: 54 mmHg — ABNORMAL HIGH (ref 32–45)

## 2022-06-30 LAB — GLUCOSE, CAPILLARY
Glucose-Capillary: 146 mg/dL — ABNORMAL HIGH (ref 70–99)
Glucose-Capillary: 137 mg/dL — ABNORMAL HIGH (ref 70–99)
Glucose-Capillary: 116 mg/dL — ABNORMAL HIGH (ref 70–99)

## 2022-06-30 LAB — C-REACTIVE PROTEIN: CRP: 22.2 mg/dL — ABNORMAL HIGH (ref <1.0)

## 2022-06-30 LAB — FOLATE: Folate: 4.3 ng/mL — ABNORMAL LOW (ref >5.9)

## 2022-06-30 LAB — TSH: TSH: 1.31 u[IU]/mL (ref 0.350–4.500)

## 2022-06-30 LAB — VITAMIN B12: Vitamin B-12: 1034 pg/mL — ABNORMAL HIGH (ref 180–914)

## 2022-06-30 LAB — AMMONIA: Ammonia: 23 umol/L (ref 9–35)

## 2022-06-30 LAB — HEMOGLOBIN A1C
Hgb A1c MFr Bld: 7.2 % — ABNORMAL HIGH (ref 4.8–5.6)
Mean Plasma Glucose: 159.94 mg/dL

## 2022-06-30 LAB — SURGICAL PCR SCREEN
MRSA, PCR: NEGATIVE
Staphylococcus aureus: NEGATIVE

## 2022-06-30 MED ORDER — ACETAMINOPHEN 325 MG PO TABS
650.0000 mg | ORAL_TABLET | Freq: Two times a day (BID) | ORAL | Status: DC
  Administered 2022-06-30 – 2022-07-06 (×11): 650 mg via ORAL
  Filled 2022-06-30 (×11): qty 2

## 2022-06-30 MED ORDER — ENSURE ENLIVE PO LIQD
237.0000 mL | Freq: Two times a day (BID) | ORAL | Status: DC
  Administered 2022-06-30 – 2022-07-06 (×7): 237 mL via ORAL

## 2022-06-30 MED ORDER — METHOCARBAMOL 1000 MG/10ML IJ SOLN
500.0000 mg | Freq: Once | INTRAVENOUS | Status: AC
  Administered 2022-06-30: 500 mg via INTRAVENOUS
  Filled 2022-06-30: qty 500

## 2022-06-30 MED ORDER — ACETAMINOPHEN 160 MG/5ML PO SOLN
650.0000 mg | Freq: Two times a day (BID) | ORAL | Status: DC
  Administered 2022-06-30: 650 mg via ORAL
  Filled 2022-06-30: qty 20.3

## 2022-06-30 MED ORDER — ADULT MULTIVITAMIN W/MINERALS CH
1.0000 | ORAL_TABLET | Freq: Every day | ORAL | Status: DC
  Administered 2022-07-02 – 2022-07-04 (×3): 1 via ORAL
  Filled 2022-06-30 (×3): qty 1

## 2022-06-30 MED ORDER — MUPIROCIN 2 % EX OINT: 1.0000 | TOPICAL_OINTMENT | Freq: Two times a day (BID) | CUTANEOUS | Status: DC

## 2022-06-30 MED ORDER — SODIUM CHLORIDE 0.9 % IV SOLN
1.0000 mg | Freq: Every day | INTRAVENOUS | Status: AC
  Administered 2022-06-30 – 2022-07-01 (×2): 1 mg via INTRAVENOUS
  Filled 2022-06-30 (×2): qty 0.2

## 2022-06-30 MED ORDER — METHOCARBAMOL 500 MG PO TABS: 500.0000 mg | ORAL_TABLET | Freq: Three times a day (TID) | ORAL | Status: DC | PRN

## 2022-06-30 MED ORDER — CHLORHEXIDINE GLUCONATE 4 % EX SOLN
60.0000 mL | Freq: Once | CUTANEOUS | Status: AC
  Administered 2022-07-01: 4 via TOPICAL

## 2022-06-30 MED ORDER — FENTANYL CITRATE PF 50 MCG/ML IJ SOSY: 12.5000 ug | PREFILLED_SYRINGE | INTRAMUSCULAR | Status: DC | PRN

## 2022-06-30 NOTE — Consult Note (Signed)
WOC Nurse Consult Note: Reason for Consult: left heel Noted to have left ankle wound as well Wound type: Unstageable pressure injury; left heel.  Stage 2 Pressure Injury; left medial ankle  Pressure Injury POA: Yes Measurement:see nursing flow sheets Wound bed: heel is stable; intact, purple.  Ankle; pink and moist  Drainage (amount, consistency, odor) none Periwound:intact  Dressing procedure/placement/frequency: Paint left heel ulcer with betadine daily, allow to air dry Offload heel at all times with Prevalon boot Silicone foam to the left ankle wound   Discussed POC with patient and bedside nurse.  Re consult if needed, will not follow at this time. Thanks  Nike Southwell [employer], RN,CWOCN, CNS, CWON-AP [phone])

## 2022-06-30 NOTE — Progress Notes (Incomplete)
PROGRESS NOTE   Joe Murray  ZOX:[id number] DOB: [birth year] DOA: 06/29/2022 PCP: Nelwyn Salisbury, MD   Date of Service: the patient was seen and examined on 06/30/2022  Brief Narrative:  82 year old male with past medical history of Parkinson disease, dementia, chronic kidney disease stage IIIa, non-insulin-dependent diabetes mellitus type II, hypertension, coronary artery disease (PCI 02/2015 with DES to RCA and mid-LAD), GERD and recent hospitalization for left femoral neck fracture status post percutaneous pinning by Dr. Blanchie Dessert 05/27/2022 now presenting to [facility] emergency department due to left hip pain and inability to ambulate.  Upon evaluation in the emergency department, patient was found to have impaction and shortening of the left femoral neck on x-ray imaging.  The hospitalist group was called and the patient was admitted to the hospital service for pain control and orthopedic surgery consultation.  Of note, patient is reported a progressive cognitive decline since the patient's initial fracture in early April.  Workup for reversible causes of the patient's change in mentation is ongoing.  Orthopedic surgery is tentatively planned for surgical intervention on 5/8.   Assessment and Plan: No notes have been filed under this hospital service. Service: Hospitalist       Subjective:  ***  Physical Exam:  Vitals:   06/30/22 0000 06/30/22 0142 06/30/22 0502 06/30/22 0932  BP: 109/60 (!) 156/88 (!) 124/59 (!) 151/80  Pulse: 67 81 67 76  Resp:  14 18 16   Temp:  98.6 F (37 C) 98 F (36.7 C) 98.3 F (36.8 C)  TempSrc:  Oral Oral Axillary  SpO2: 94% 97% 96% 97%  Weight:      Height:        *** Constitutional: Awake alert and oriented x3, no associated distress.   Skin: no rashes, no lesions, good skin turgor noted. Eyes: Pupils are equally reactive to light.  No evidence of scleral icterus or conjunctival pallor.  ENMT: Moist mucous membranes noted.   Posterior pharynx clear of any exudate or lesions.   Respiratory: clear to auscultation bilaterally, no wheezing, no crackles. Normal respiratory effort. No accessory muscle use.  Cardiovascular: Regular rate and rhythm, no murmurs / rubs / gallops. No extremity edema. 2+ pedal pulses. No carotid bruits.  Abdomen: Abdomen is soft and nontender.  No evidence of intra-abdominal masses.  Positive bowel sounds noted in all quadrants.   Musculoskeletal: No joint deformity upper and lower extremities. Good ROM, no contractures. Normal muscle tone.    Data Reviewed:  I have personally reviewed and interpreted labs, imaging.  Significant findings are ***  CBC: Recent Labs  Lab 06/29/22 1004  WBC 16.2*  NEUTROABS 13.2*  HGB 13.0  HCT 40.8  MCV 96.0  PLT 270   Basic Metabolic Panel: Recent Labs  Lab 06/29/22 1004  NA 138  K 4.3  CL 104  CO2 25  GLUCOSE 185*  BUN 28*  CREATININE 1.26*  CALCIUM 8.9  MG 2.1   GFR: Estimated Creatinine Clearance: 51.4 mL/min (A) (by C-G formula based on SCr of 1.26 mg/dL (H)). Liver Function Tests: Recent Labs  Lab 06/29/22 1004  AST 35  ALT 16  ALKPHOS 92  BILITOT 0.7  PROT 7.0  ALBUMIN 3.2*    Coagulation Profile: No results for input(s): "INR", "PROTIME" in the last 168 hours.   EKG/Telemetry: Personally reviewed.  Rhythm is *** with heart rate of ***.  No dynamic ST segment changes appreciated.   Code Status:  {Palliative Code status:23503}.  Code status decision has  been confirmed with: *** Family Communication: ***    Severity of Illness:  {Observation/Inpatient:21159}  Time spent:  *** minutes  Author:  Marinda Elk MD  06/30/2022 10:04 AM

## 2022-06-30 NOTE — Progress Notes (Signed)
EEG complete - results pending 

## 2022-06-30 NOTE — Hospital Course (Signed)
82 year old male with past medical history of Parkinson disease, dementia, chronic kidney disease stage IIIa, non-insulin-dependent diabetes mellitus type II, hypertension, coronary artery disease (PCI 02/2015 with DES to RCA and mid-LAD), GERD and recent hospitalization for left femoral neck fracture status post percutaneous pinning by Dr. Blanchie Dessert 05/27/2022 now presenting to [facility] emergency department due to left hip pain and inability to ambulate.  Upon evaluation in the emergency department, patient was found to have impaction and shortening of the left femoral neck on x-ray imaging.  The hospitalist group was called and the patient was admitted to the hospital service for pain control and orthopedic surgery consultation.  Of note, patient is reported a progressive cognitive decline since the patient's initial fracture in early April.  Workup for reversible causes of the patient's change in mentation is ongoing.  Orthopedic surgery is tentatively planned for surgical intervention on 5/8.

## 2022-06-30 NOTE — Progress Notes (Signed)
Long conversation again this afternoon in person with patient's family the daughter and wife.  Feel given the reassuring workup that likely conversion to total hip arthroplasty will improve the patient's postoperative pain.  This may also improve some of his delirium which is likely exacerbated beyond his baseline dementia.  Risks and benefits were discussed.  Patient's family agreeable to proceed with surgery tomorrow.  Patient to be n.p.o. after midnight.

## 2022-06-30 NOTE — Procedures (Signed)
Patient Name: Joe Murray  MRN: [id number]  Epilepsy Attending: Charlsie Quest  Referring Physician/Provider: Marinda Elk, MD  Date:  06/30/2022  Duration: 25.54 mins  Patient history: 82yo M with ams getting eeg to evaluate for seizure  Level of alertness: Awake  AEDs during EEG study: None  Technical aspects: This EEG study was done with scalp electrodes positioned according to the 10-20 International system of electrode placement. Electrical activity was reviewed with band pass filter of 1-70Hz , sensitivity of 7 uV/mm, display speed of 77mm/sec with a 60Hz  notched filter applied as appropriate. EEG data were recorded continuously and digitally stored.  Video monitoring was available and reviewed as appropriate.  Description: EEG showed continuous generalized 3 to 6 Hz theta-delta slowing.  Hyperventilation and photic stimulation were not performed.     ABNORMALITY - Continuous slow, generalized  IMPRESSION: This study is suggestive of moderate diffuse encephalopathy, nonspecific etiology. No seizures or epileptiform discharges were seen throughout the recording.  Zaccai Chavarin Annabelle Harman

## 2022-06-30 NOTE — Plan of Care (Signed)
  Problem: Elimination: Goal: Will not experience complications related to bowel motility Outcome: Progressing   Problem: Pain Managment: Goal: General experience of comfort will improve Outcome: Progressing   Problem: Skin Integrity: Goal: Risk for impaired skin integrity will decrease Outcome: Progressing   

## 2022-06-30 NOTE — Progress Notes (Signed)
Subjective: Patient lying in bed on his right side.  Sleeping comfortably.  Not responding to commands.  Dementia at baseline.  When tried to manipulate the left leg notes to have some discomfort.  No family at bedside but did give them a call this morning.  Family concerned about patient's progressive decline.  Has not been doing well in rehab.  Given dementia and not cooperative with commands.  Concern for fall risk with mobility.  Not able to mobilize well.  Markedly worse in the last 1 week.  Objective:   VITALS:   Vitals:   06/29/22 2345 06/30/22 0000 06/30/22 0142 06/30/22 0502  BP: 122/60 109/60 (!) 156/88 (!) 124/59  Pulse: 69 67 81 67  Resp: 16  14 18   Temp:   98.6 F (37 C) 98 F (36.7 C)  TempSrc:   Oral Oral  SpO2: 94% 94% 97% 96%  Weight:      Height:       Well-healed incision over left lateral hip, moderate discomfort with left hip range of motion no knee or ankle effusion no distal edema, foot is warm well-perfused, patient does not cooperate with motor or sensory exam   Lab Results  Component Value Date   WBC 16.2 (H) 06/29/2022   HGB 13.0 06/29/2022   HCT 40.8 06/29/2022   MCV 96.0 06/29/2022   PLT 270 06/29/2022   BMET    Component Value Date/Time   NA 138 06/29/2022 1004   NA 142 06/01/2019 0824   K 4.3 06/29/2022 1004   CL 104 06/29/2022 1004   CO2 25 06/29/2022 1004   GLUCOSE 185 (H) 06/29/2022 1004   BUN 28 (H) 06/29/2022 1004   BUN 22 06/01/2019 0824   CREATININE 1.26 (H) 06/29/2022 1004   CREATININE 1.26 (H) 11/07/2019 1048   CALCIUM 8.9 06/29/2022 1004   GFRNONAA 57 (L) 06/29/2022 1004     Xray: New left hip x-rays compared to her initial postop demonstrate moderate compression of femoral neck fracture with some backing out of the screws laterally.  No notable perforation of the screws to the femoral head  Assessment/Plan:     Principal Problem:   Closed left hip fracture, sequela Active Problems:   GERD   CAD S/P percutaneous  coronary angioplasty - LADx2, & RCA x 3   DM (diabetes mellitus), type 2 with complications - CAD   Dyslipidemia, goal LDL below 70   Essential hypertension -> allowing permissive hypertension.  Medication stopped because of fatigue and orthostatic dizziness.   Parkinson's disease   Alzheimer's dementia (HCC)   Chronic kidney disease, stage 3a (HCC)  Discussed with the patient's family, specifically his wife and daughter, regarding new x-ray findings concerning for moderate collapse of the fracture.  This may be contributing to some persistent discomfort of the hip joint and limited mobility as I have concern for malunion as well as possible nonunion at this point. Discussed that given the progressive functional decline and these findings could benefit from conversion to a hip arthroplasty.  Goals of the surgery would be to improve pain control and mobility. Risks of this surgery including infection, dislocation, fracture, risk of anesthesia were discuss.   Regading his progressive cognitive decline, this may be exacerbated by the pain he is having from the hip at this point, however, unlikely that this will significantly improve after surgery given his baseline dementia.  Discussed with Dr. Leafy Half about getting additional workup for this. Family interested in having neurology involved  as they have seen him previously.   Not unreasonable to consider nonoperative treatment and hospice care moving forward.  Family would like to get additional information regarding his cognitive prognosis before deciding if they would like to proceed with surgery or hospice. Tentatively holding OR spot for Wednesday 5/8, if they would like to proceed with surgery.      Jourdan Durbin A Maanasa Aderhold 06/30/2022, 8:22 AM   Weber Cooks, MD  Contact information:   [phone] 7am-5pm epic message Dr. Blanchie Dessert, or call office for patient follow up: [phone] After hours and holidays please check [email or web address] for  group call information for [facility]

## 2022-06-30 NOTE — Plan of Care (Signed)
  Problem: Elimination: Goal: Will not experience complications related to bowel motility Outcome: Progressing Goal: Will not experience complications related to urinary retention Outcome: Progressing   Problem: Pain Managment: Goal: General experience of comfort will improve Outcome: Progressing   

## 2022-06-30 NOTE — Progress Notes (Signed)
Initial Nutrition Assessment  DOCUMENTATION CODES:   Not applicable  INTERVENTION:  - Heart Healthy/Carb Modified as medically appropriate.  - Plan for SLP eval per discussion with MD. - Ensure Plus High Protein po BID, each supplement provides 350 kcal and 20 grams of protein. - Multivitamin with minerals daily to support micronutrient needs.  - Monitor weight trends.   NUTRITION DIAGNOSIS:   Inadequate oral intake related to decreased appetite as evidenced by per patient/family report, energy intake < 75% for > or equal to 1 month.  GOAL:   Patient will meet greater than or equal to 90% of their needs  MONITOR:   PO intake, Supplement acceptance, Weight trends  REASON FOR ASSESSMENT:   Consult Hip fracture protocol  ASSESSMENT:   82 y.o. male with PMH Alzheimer's dementia, CAD, type 2 diabetes, diverticulosis, HLD, nodular basal cell carcinoma, Parkinson's disease, psoriasis who underwent left hip fracture repair on 05/27/2022, subsequently discharged to [facility] for rehab who was brought to the ED after being retracted in bed due to worsening pain over the last 4 days.  Patient laying in bed, groaning at time of visit. Family members at bedside.   They report patient's UBW to be 212-213# and feel he has lost a lot of weight since being at rehab the past month. Per EMR, no significant changes in weight the past year. Weight this admission is 217# but they feel this is not accurate. This weight is exactly the same as weight taken during last admission 1 month ago so suspect it may not be accurate. Will order new weight.  Family reports he has not been eating well at facility. Some days would eat fair but most wouldn't eat well at all.  They report he has had some swallowing difficulties in the past and does better with soft foods. Discussed SLP eval and family report they are unsure if patient would even open mouth. Informed them I would discuss with MD and per that  discussion, plan to get formal SLP eval.  In the mean time, family plan to order patient soft foods. They are agreeable to him receiving Ensure.  Palliative now consulted for goals of care.    Medications reviewed and include: Sinemet, vitamin B12, Magnesium daily, Senokot  Labs reviewed:  Creatinine 1.26 HA1C 7.2 Blood Glucose 129-193 x24 hours   NUTRITION - FOCUSED PHYSICAL EXAM:  Unable to complete at this time  Diet Order:   Diet Order             Diet heart healthy/carb modified Room service appropriate? Yes; Fluid consistency: Thin  Diet effective now                   EDUCATION NEEDS:  Not appropriate for education at this time  Skin:  Skin Assessment: Skin Integrity Issues: Skin Integrity Issues:: Stage II Stage II: L foot  Last BM:  5/7  Height:  Ht Readings from Last 1 Encounters:  06/29/22 5\' 8"  (1.727 m)   Weight:  Wt Readings from Last 1 Encounters:  06/29/22 98.5 kg    BMI:  Body mass index is 33.02 kg/m.  Estimated Nutritional Needs:  Kcal:  1850-2000 kcals Protein:  90-100 grams Fluid:  >/= 1.8L    Shelle Iron RD, LDN For contact information, refer to [facility].

## 2022-07-01 ENCOUNTER — Inpatient Hospital Stay (HOSPITAL_COMMUNITY): Payer: Medicare HMO | Admitting: Anesthesiology

## 2022-07-01 ENCOUNTER — Other Ambulatory Visit: Payer: Self-pay

## 2022-07-01 ENCOUNTER — Inpatient Hospital Stay (HOSPITAL_COMMUNITY): Payer: Medicare HMO

## 2022-07-01 ENCOUNTER — Encounter (HOSPITAL_COMMUNITY): Payer: Self-pay | Admitting: *Deleted

## 2022-07-01 ENCOUNTER — Encounter (HOSPITAL_COMMUNITY)
Admission: EM | Disposition: A | Discharge status: Skilled Nursing Facility | Payer: Self-pay | Source: Skilled Nursing Facility | Attending: Internal Medicine

## 2022-07-01 DIAGNOSIS — S728X2A Other fracture of left femur, initial encounter for closed fracture: Secondary | ICD-10-CM

## 2022-07-01 DIAGNOSIS — I10 Essential (primary) hypertension: Secondary | ICD-10-CM | POA: Diagnosis not present

## 2022-07-01 DIAGNOSIS — F17210 Nicotine dependence, cigarettes, uncomplicated: Secondary | ICD-10-CM

## 2022-07-01 DIAGNOSIS — I252 Old myocardial infarction: Secondary | ICD-10-CM | POA: Diagnosis not present

## 2022-07-01 DIAGNOSIS — Z7189 Other specified counseling: Secondary | ICD-10-CM

## 2022-07-01 DIAGNOSIS — S72002K Fracture of unspecified part of neck of left femur, subsequent encounter for closed fracture with nonunion: Secondary | ICD-10-CM

## 2022-07-01 DIAGNOSIS — F17200 Nicotine dependence, unspecified, uncomplicated: Secondary | ICD-10-CM | POA: Diagnosis not present

## 2022-07-01 DIAGNOSIS — I2510 Atherosclerotic heart disease of native coronary artery without angina pectoris: Secondary | ICD-10-CM | POA: Diagnosis not present

## 2022-07-01 DIAGNOSIS — E538 Deficiency of other specified B group vitamins: Secondary | ICD-10-CM | POA: Insufficient documentation

## 2022-07-01 HISTORY — PX: CONVERSION TO TOTAL HIP: SHX5784

## 2022-07-01 LAB — COMPREHENSIVE METABOLIC PANEL
ALT: 7 U/L (ref 0–44)
AST: 39 U/L (ref 15–41)
Albumin: 2.5 g/dL — ABNORMAL LOW (ref 3.5–5.0)
Alkaline Phosphatase: 106 U/L (ref 38–126)
Anion gap: 8 (ref 5–15)
BUN: 24 mg/dL — ABNORMAL HIGH (ref 8–23)
CO2: 25 mmol/L (ref 22–32)
Calcium: 9.1 mg/dL (ref 8.9–10.3)
Chloride: 109 mmol/L (ref 98–111)
Creatinine, Ser: 1.13 mg/dL (ref 0.61–1.24)
GFR, Estimated: >60 mL/min (ref >60)
Glucose, Bld: 128 mg/dL — ABNORMAL HIGH (ref 70–99)
Potassium: 3.9 mmol/L (ref 3.5–5.1)
Sodium: 142 mmol/L (ref 135–145)
Total Bilirubin: 0.8 mg/dL (ref 0.3–1.2)
Total Protein: 6.4 g/dL — ABNORMAL LOW (ref 6.5–8.1)

## 2022-07-01 LAB — GLUCOSE, CAPILLARY
Glucose-Capillary: 130 mg/dL — ABNORMAL HIGH (ref 70–99)
Glucose-Capillary: 132 mg/dL — ABNORMAL HIGH (ref 70–99)
Glucose-Capillary: 148 mg/dL — ABNORMAL HIGH (ref 70–99)
Glucose-Capillary: 152 mg/dL — ABNORMAL HIGH (ref 70–99)
Glucose-Capillary: 209 mg/dL — ABNORMAL HIGH (ref 70–99)
Glucose-Capillary: 211 mg/dL — ABNORMAL HIGH (ref 70–99)

## 2022-07-01 LAB — PROTIME-INR
INR: 1.1 (ref 0.8–1.2)
Prothrombin Time: 14.5 seconds (ref 11.4–15.2)

## 2022-07-01 LAB — TYPE AND SCREEN
ABO/RH(D): A POS
Antibody Screen: NEGATIVE

## 2022-07-01 LAB — MAGNESIUM: Magnesium: 2 mg/dL (ref 1.7–2.4)

## 2022-07-01 LAB — CBC WITH DIFFERENTIAL/PLATELET
Abs Immature Granulocytes: 0.07 10*3/uL (ref 0.00–0.07)
Basophils Absolute: 0 10*3/uL (ref 0.0–0.1)
Basophils Relative: 0 %
Eosinophils Absolute: 0.2 10*3/uL (ref 0.0–0.5)
Eosinophils Relative: 2 %
HCT: 36.8 % — ABNORMAL LOW (ref 39.0–52.0)
Hemoglobin: 11.6 g/dL — ABNORMAL LOW (ref 13.0–17.0)
Immature Granulocytes: 1 %
Lymphocytes Relative: 13 %
Lymphs Abs: 1.8 10*3/uL (ref 0.7–4.0)
MCH: 31.3 pg (ref 26.0–34.0)
MCHC: 31.5 g/dL (ref 30.0–36.0)
MCV: 99.2 fL (ref 80.0–100.0)
Monocytes Absolute: 0.9 10*3/uL (ref 0.1–1.0)
Monocytes Relative: 7 %
Neutro Abs: 10.5 10*3/uL — ABNORMAL HIGH (ref 1.7–7.7)
Neutrophils Relative %: 77 %
Platelets: 262 10*3/uL (ref 150–400)
RBC: 3.71 MIL/uL — ABNORMAL LOW (ref 4.22–5.81)
RDW: 13.3 % (ref 11.5–15.5)
WBC: 13.6 10*3/uL — ABNORMAL HIGH (ref 4.0–10.5)
nRBC: 0 % (ref 0.0–0.2)

## 2022-07-01 LAB — VITAMIN D 25 HYDROXY (VIT D DEFICIENCY, FRACTURES): Vit D, 25-Hydroxy: 45.21 ng/mL (ref 30–100)

## 2022-07-01 LAB — RPR: RPR Ser Ql: NONREACTIVE

## 2022-07-01 SURGERY — CONVERSION, PREVIOUS HIP SURGERY, TO TOTAL HIP ARTHROPLASTY
Anesthesia: General | Site: Hip | Laterality: Left

## 2022-07-01 MED ORDER — ACETAMINOPHEN 325 MG PO TABS
325.0000 mg | ORAL_TABLET | Freq: Four times a day (QID) | ORAL | Status: DC | PRN
  Administered 2022-07-05: 650 mg via ORAL
  Filled 2022-07-01: qty 2

## 2022-07-01 MED ORDER — METHOCARBAMOL 500 MG PO TABS
500.0000 mg | ORAL_TABLET | Freq: Four times a day (QID) | ORAL | Status: DC | PRN
  Administered 2022-07-05: 500 mg via ORAL
  Filled 2022-07-01: qty 1

## 2022-07-01 MED ORDER — FENTANYL CITRATE PF 50 MCG/ML IJ SOSY
PREFILLED_SYRINGE | INTRAMUSCULAR | Status: AC
  Filled 2022-07-01: qty 1

## 2022-07-01 MED ORDER — DOCUSATE SODIUM 100 MG PO CAPS
100.0000 mg | ORAL_CAPSULE | Freq: Two times a day (BID) | ORAL | Status: DC
  Administered 2022-07-01 – 2022-07-05 (×6): 100 mg via ORAL
  Filled 2022-07-01 (×7): qty 1

## 2022-07-01 MED ORDER — PROPOFOL 10 MG/ML IV BOLUS
INTRAVENOUS | Status: AC
  Filled 2022-07-01: qty 20

## 2022-07-01 MED ORDER — PHENYLEPHRINE 80 MCG/ML (10ML) SYRINGE FOR IV PUSH (FOR BLOOD PRESSURE SUPPORT)
PREFILLED_SYRINGE | INTRAVENOUS | Status: DC | PRN
  Administered 2022-07-01: 160 ug via INTRAVENOUS

## 2022-07-01 MED ORDER — FOLIC ACID 5 MG/ML IJ SOLN
1.0000 mg | Freq: Every day | INTRAMUSCULAR | Status: DC
  Administered 2022-07-02 – 2022-07-03 (×2): 1 mg via INTRAVENOUS
  Filled 2022-07-01 (×2): qty 0.2

## 2022-07-01 MED ORDER — LIDOCAINE 2% (20 MG/ML) 5 ML SYRINGE
INTRAMUSCULAR | Status: DC | PRN
  Administered 2022-07-01: 60 mg via INTRAVENOUS

## 2022-07-01 MED ORDER — FENTANYL CITRATE PF 50 MCG/ML IJ SOSY: 25.0000 ug | PREFILLED_SYRINGE | INTRAMUSCULAR | Status: DC | PRN

## 2022-07-01 MED ORDER — SODIUM CHLORIDE (PF) 0.9 % IJ SOLN
INTRAMUSCULAR | Status: AC
  Filled 2022-07-01: qty 50

## 2022-07-01 MED ORDER — PROPOFOL 1000 MG/100ML IV EMUL
INTRAVENOUS | Status: AC
  Filled 2022-07-01: qty 100

## 2022-07-01 MED ORDER — DIPHENHYDRAMINE HCL 12.5 MG/5ML PO ELIX: 12.5000 mg | ORAL_SOLUTION | ORAL | Status: DC | PRN

## 2022-07-01 MED ORDER — ONDANSETRON HCL 4 MG/2ML IJ SOLN
INTRAMUSCULAR | Status: AC
  Filled 2022-07-01: qty 2

## 2022-07-01 MED ORDER — ROCURONIUM BROMIDE 10 MG/ML (PF) SYRINGE
PREFILLED_SYRINGE | INTRAVENOUS | Status: AC
  Filled 2022-07-01: qty 10

## 2022-07-01 MED ORDER — SODIUM CHLORIDE (PF) 0.9 % IJ SOLN
INTRAMUSCULAR | Status: AC
  Filled 2022-07-01: qty 10

## 2022-07-01 MED ORDER — ENOXAPARIN SODIUM 40 MG/0.4ML IJ SOSY
40.0000 mg | PREFILLED_SYRINGE | INTRAMUSCULAR | Status: DC
  Administered 2022-07-02 – 2022-07-06 (×5): 40 mg via SUBCUTANEOUS
  Filled 2022-07-01 (×5): qty 0.4

## 2022-07-01 MED ORDER — PHENYLEPHRINE 80 MCG/ML (10ML) SYRINGE FOR IV PUSH (FOR BLOOD PRESSURE SUPPORT)
PREFILLED_SYRINGE | INTRAVENOUS | Status: AC
  Filled 2022-07-01: qty 10

## 2022-07-01 MED ORDER — SODIUM CHLORIDE (PF) 0.9 % IJ SOLN
INTRAMUSCULAR | Status: DC | PRN
  Administered 2022-07-01: 50 mL
  Administered 2022-07-01: 10 mL

## 2022-07-01 MED ORDER — TRANEXAMIC ACID-NACL 1000-0.7 MG/100ML-% IV SOLN
1000.0000 mg | INTRAVENOUS | Status: AC
  Administered 2022-07-01: 1000 mg via INTRAVENOUS
  Filled 2022-07-01: qty 100

## 2022-07-01 MED ORDER — HYDRALAZINE HCL 20 MG/ML IJ SOLN: 5.0000 mg | Freq: Four times a day (QID) | INTRAMUSCULAR | Status: DC | PRN

## 2022-07-01 MED ORDER — WATER FOR IRRIGATION, STERILE IR SOLN
Status: DC | PRN
  Administered 2022-07-01 (×2): 1000 mL

## 2022-07-01 MED ORDER — BUPIVACAINE LIPOSOME 1.3 % IJ SUSP
INTRAMUSCULAR | Status: DC | PRN
  Administered 2022-07-01: 20 mL

## 2022-07-01 MED ORDER — METHOCARBAMOL 1000 MG/10ML IJ SOLN: 500.0000 mg | Freq: Four times a day (QID) | INTRAVENOUS | Status: DC | PRN

## 2022-07-01 MED ORDER — CEFAZOLIN SODIUM-DEXTROSE 2-4 GM/100ML-% IV SOLN
2.0000 g | INTRAVENOUS | Status: AC
  Administered 2022-07-01: 2 g via INTRAVENOUS
  Filled 2022-07-01: qty 100

## 2022-07-01 MED ORDER — PHENOL 1.4 % MT LIQD: 1.0000 | OROMUCOSAL | Status: DC | PRN

## 2022-07-01 MED ORDER — LACTATED RINGERS IV SOLN: INTRAVENOUS | Status: DC

## 2022-07-01 MED ORDER — FENTANYL CITRATE (PF) 100 MCG/2ML IJ SOLN
INTRAMUSCULAR | Status: DC | PRN
  Administered 2022-07-01 (×2): 50 ug via INTRAVENOUS

## 2022-07-01 MED ORDER — 0.9 % SODIUM CHLORIDE (POUR BTL) OPTIME
TOPICAL | Status: DC | PRN
  Administered 2022-07-01: 1000 mL

## 2022-07-01 MED ORDER — PROPOFOL 500 MG/50ML IV EMUL
INTRAVENOUS | Status: DC | PRN
  Administered 2022-07-01: 100 ug/kg/min via INTRAVENOUS

## 2022-07-01 MED ORDER — ONDANSETRON HCL 4 MG/2ML IJ SOLN: 4.0000 mg | Freq: Once | INTRAMUSCULAR | Status: DC | PRN

## 2022-07-01 MED ORDER — FENTANYL CITRATE (PF) 100 MCG/2ML IJ SOLN
INTRAMUSCULAR | Status: AC
  Filled 2022-07-01: qty 2

## 2022-07-01 MED ORDER — MENTHOL 3 MG MT LOZG: 1.0000 | LOZENGE | OROMUCOSAL | Status: DC | PRN

## 2022-07-01 MED ORDER — ROCURONIUM BROMIDE 10 MG/ML (PF) SYRINGE
PREFILLED_SYRINGE | INTRAVENOUS | Status: DC | PRN
  Administered 2022-07-01: 60 mg via INTRAVENOUS

## 2022-07-01 MED ORDER — OXYCODONE HCL 5 MG PO TABS: 5.0000 mg | ORAL_TABLET | ORAL | Status: DC | PRN

## 2022-07-01 MED ORDER — OXYCODONE HCL 5 MG/5ML PO SOLN: 5.0000 mg | Freq: Once | ORAL | Status: DC | PRN

## 2022-07-01 MED ORDER — ACETAMINOPHEN 10 MG/ML IV SOLN
INTRAVENOUS | Status: DC | PRN
  Administered 2022-07-01: 1000 mg via INTRAVENOUS

## 2022-07-01 MED ORDER — SODIUM CHLORIDE 0.9 % IR SOLN
Status: DC | PRN
  Administered 2022-07-01: 3000 mL
  Administered 2022-07-01: 1000 mL

## 2022-07-01 MED ORDER — CEFAZOLIN SODIUM-DEXTROSE 2-4 GM/100ML-% IV SOLN
2.0000 g | Freq: Four times a day (QID) | INTRAVENOUS | Status: DC
Start: 2022-07-01 — End: 2022-07-01

## 2022-07-01 MED ORDER — ACETAMINOPHEN 10 MG/ML IV SOLN
INTRAVENOUS | Status: AC
  Filled 2022-07-01: qty 100

## 2022-07-01 MED ORDER — ACETAMINOPHEN 500 MG PO TABS
1000.0000 mg | ORAL_TABLET | Freq: Four times a day (QID) | ORAL | Status: AC
  Administered 2022-07-01 – 2022-07-02 (×3): 1000 mg via ORAL
  Filled 2022-07-01 (×4): qty 2

## 2022-07-01 MED ORDER — CEFAZOLIN SODIUM-DEXTROSE 2-4 GM/100ML-% IV SOLN
2.0000 g | Freq: Three times a day (TID) | INTRAVENOUS | Status: AC
  Administered 2022-07-01 – 2022-07-02 (×2): 2 g via INTRAVENOUS
  Filled 2022-07-01 (×2): qty 100

## 2022-07-01 MED ORDER — SENNA 8.6 MG PO TABS
1.0000 | ORAL_TABLET | Freq: Two times a day (BID) | ORAL | Status: DC
  Administered 2022-07-01 – 2022-07-05 (×8): 8.6 mg via ORAL
  Filled 2022-07-01 (×9): qty 1

## 2022-07-01 MED ORDER — PHENYLEPHRINE HCL-NACL 20-0.9 MG/250ML-% IV SOLN
INTRAVENOUS | Status: DC | PRN
  Administered 2022-07-01: 50 ug/min via INTRAVENOUS

## 2022-07-01 MED ORDER — BUPIVACAINE LIPOSOME 1.3 % IJ SUSP
INTRAMUSCULAR | Status: AC
  Filled 2022-07-01: qty 20

## 2022-07-01 MED ORDER — POLYETHYLENE GLYCOL 3350 17 G PO PACK: 17.0000 g | PACK | Freq: Every day | ORAL | Status: DC | PRN

## 2022-07-01 MED ORDER — ONDANSETRON HCL 4 MG PO TABS: 4.0000 mg | ORAL_TABLET | Freq: Four times a day (QID) | ORAL | Status: DC | PRN

## 2022-07-01 MED ORDER — PROPOFOL 10 MG/ML IV BOLUS
INTRAVENOUS | Status: DC | PRN
  Administered 2022-07-01: 100 mg via INTRAVENOUS
  Administered 2022-07-01: 25 mg via INTRAVENOUS

## 2022-07-01 MED ORDER — ONDANSETRON HCL 4 MG/2ML IJ SOLN
INTRAMUSCULAR | Status: DC | PRN
  Administered 2022-07-01: 4 mg via INTRAVENOUS

## 2022-07-01 MED ORDER — PANTOPRAZOLE SODIUM 40 MG PO TBEC
40.0000 mg | DELAYED_RELEASE_TABLET | Freq: Every day | ORAL | Status: DC
  Administered 2022-07-02 – 2022-07-04 (×3): 40 mg via ORAL
  Filled 2022-07-01 (×3): qty 1

## 2022-07-01 MED ORDER — OXYCODONE HCL 5 MG PO TABS: 5.0000 mg | ORAL_TABLET | Freq: Once | ORAL | Status: DC | PRN

## 2022-07-01 MED ORDER — DEXAMETHASONE SODIUM PHOSPHATE 10 MG/ML IJ SOLN
INTRAMUSCULAR | Status: DC | PRN
  Administered 2022-07-01: 10 mg via INTRAVENOUS

## 2022-07-01 MED ORDER — HYDROMORPHONE HCL 1 MG/ML IJ SOLN: 0.5000 mg | INTRAMUSCULAR | Status: DC | PRN

## 2022-07-01 MED ORDER — POVIDONE-IODINE 10 % EX SWAB: 2.0000 | Freq: Once | CUTANEOUS | Status: DC

## 2022-07-01 MED ORDER — ONDANSETRON HCL 4 MG/2ML IJ SOLN: 4.0000 mg | Freq: Four times a day (QID) | INTRAMUSCULAR | Status: DC | PRN

## 2022-07-01 MED ORDER — LACTATED RINGERS IV SOLN: INTRAVENOUS | Status: DC | PRN

## 2022-07-01 MED ORDER — KETOROLAC TROMETHAMINE 15 MG/ML IJ SOLN
7.5000 mg | Freq: Four times a day (QID) | INTRAMUSCULAR | Status: AC
  Administered 2022-07-01 – 2022-07-02 (×4): 7.5 mg via INTRAVENOUS
  Filled 2022-07-01 (×4): qty 1

## 2022-07-01 MED ORDER — DEXAMETHASONE SODIUM PHOSPHATE 10 MG/ML IJ SOLN
INTRAMUSCULAR | Status: AC
  Filled 2022-07-01: qty 1

## 2022-07-01 SURGICAL SUPPLY — 75 items
ADH SKN CLS APL DERMABOND .7 (GAUZE/BANDAGES/DRESSINGS) ×1
APL PRP STRL LF DISP 70% ISPRP (MISCELLANEOUS) ×2
BAG COUNTER SPONGE SURGICOUNT (BAG) IMPLANT
BAG DECANTER FOR FLEXI CONT (MISCELLANEOUS) ×2 IMPLANT
BAG SPEC THK2 15X12 ZIP CLS (MISCELLANEOUS) ×1
BAG SPNG CNTER NS LX DISP (BAG)
BAG ZIPLOCK 12X15 (MISCELLANEOUS) ×2 IMPLANT
BLADE SAW SAG 25X90X1.19 (BLADE) IMPLANT
BLADE SAW SGTL 81X20 HD (BLADE) ×2 IMPLANT
BRUSH FEMORAL CANAL (MISCELLANEOUS) IMPLANT
CEMENT RESTRICTOR BONE PREP ST (Cement) IMPLANT
CHLORAPREP W/TINT 26 (MISCELLANEOUS) ×4 IMPLANT
CNTNR URN SCR LID CUP LEK RST (MISCELLANEOUS) IMPLANT
CONT SPEC 4OZ STRL OR WHT (MISCELLANEOUS)
COVER SURGICAL LIGHT HANDLE (MISCELLANEOUS) ×2 IMPLANT
DERMABOND ADVANCED .7 DNX12 (GAUZE/BANDAGES/DRESSINGS) ×2 IMPLANT
DRAPE 3/4 80X56 (DRAPES) ×4 IMPLANT
DRAPE C-ARM 42X120 X-RAY (DRAPES) ×2 IMPLANT
DRAPE HIP W/POCKET STRL (MISCELLANEOUS) ×2 IMPLANT
DRAPE INCISE IOBAN 66X45 STRL (DRAPES) ×2 IMPLANT
DRAPE INCISE IOBAN 85X60 (DRAPES) ×2 IMPLANT
DRAPE POUCH INSTRU U-SHP 10X18 (DRAPES) ×2 IMPLANT
DRAPE U-SHAPE 47X51 STRL (DRAPES) ×2 IMPLANT
DRESSING AQUACEL AG SP 3.5X10 (GAUZE/BANDAGES/DRESSINGS) ×2 IMPLANT
DRSG AQUACEL AG SP 3.5X10 (GAUZE/BANDAGES/DRESSINGS) ×1
ELECT BLADE TIP CTD 4 INCH (ELECTRODE) ×2 IMPLANT
ELECT NDL TIP 2.8 STRL (NEEDLE) ×2 IMPLANT
ELECT NEEDLE TIP 2.8 STRL (NEEDLE) ×1 IMPLANT
ELECT REM PT RETURN 15FT ADLT (MISCELLANEOUS) ×2 IMPLANT
FEMORAL HEAD LFIT V40 28MM PL0 (Orthopedic Implant) IMPLANT
GLOVE BIO SURGEON STRL SZ 6.5 (GLOVE) ×4 IMPLANT
GLOVE BIOGEL PI IND STRL 6.5 (GLOVE) ×2 IMPLANT
GLOVE BIOGEL PI IND STRL 8 (GLOVE) ×2 IMPLANT
GLOVE SURG ORTHO 8.0 STRL STRW (GLOVE) ×4 IMPLANT
GOWN STRL REUS W/ TWL XL LVL3 (GOWN DISPOSABLE) ×2 IMPLANT
GOWN STRL REUS W/TWL XL LVL3 (GOWN DISPOSABLE) ×1
HANDPIECE INTERPULSE COAX TIP (DISPOSABLE)
HEAD BIPOLAR LOCK UHR 28X52 (Head) IMPLANT
HOLDER FOLEY CATH W/STRAP (MISCELLANEOUS) ×2 IMPLANT
HOOD PEEL AWAY T7 (MISCELLANEOUS) ×6 IMPLANT
JET LAVAGE IRRISEPT WOUND (IRRIGATION / IRRIGATOR)
KIT BASIN OR (CUSTOM PROCEDURE TRAY) ×2 IMPLANT
KIT TURNOVER KIT A (KITS) IMPLANT
LAVAGE JET IRRISEPT WOUND (IRRIGATION / IRRIGATOR) IMPLANT
MANIFOLD NEPTUNE II (INSTRUMENTS) ×2 IMPLANT
MARKER SKIN DUAL TIP RULER LAB (MISCELLANEOUS) ×2 IMPLANT
NDL HYPO 22X1.5 SAFETY MO (MISCELLANEOUS) IMPLANT
NEEDLE HYPO 22X1.5 SAFETY MO (MISCELLANEOUS) IMPLANT
NS IRRIG 1000ML POUR BTL (IV SOLUTION) ×2 IMPLANT
PACK TOTAL JOINT (CUSTOM PROCEDURE TRAY) ×2 IMPLANT
PRESSURIZER FEMORAL UNIV (MISCELLANEOUS) IMPLANT
PROTECTOR NERVE ULNAR (MISCELLANEOUS) ×2 IMPLANT
RETRIEVER SUT HEWSON (MISCELLANEOUS) ×2 IMPLANT
SEALER BIPOLAR AQUA 6.0 (INSTRUMENTS) ×2 IMPLANT
SET HNDPC FAN SPRY TIP SCT (DISPOSABLE) IMPLANT
SOLUTION IRRIG SURGIPHOR (IV SOLUTION) ×2 IMPLANT
SPIKE FLUID TRANSFER (MISCELLANEOUS) ×6 IMPLANT
STEM FEM CEMT 49X158 SZ6 127D (Stem) IMPLANT
SUCTION FRAZIER HANDLE 12FR (TUBING) ×1
SUCTION TUBE FRAZIER 12FR DISP (TUBING) ×2 IMPLANT
SUT BONE WAX W31G (SUTURE) ×2 IMPLANT
SUT ETHIBOND #5 BRAIDED 30INL (SUTURE) ×2 IMPLANT
SUT MNCRL AB 3-0 PS2 18 (SUTURE) ×2 IMPLANT
SUT STRATAFIX 0 PDS 27 VIOLET (SUTURE) ×1
SUT STRATAFIX PDO 1 14 VIOLET (SUTURE) ×1
SUT STRATFX PDO 1 14 VIOLET (SUTURE) ×1
SUT VIC AB 2-0 CT2 27 (SUTURE) ×4 IMPLANT
SUTURE STRATFX 0 PDS 27 VIOLET (SUTURE) ×2 IMPLANT
SUTURE STRATFX PDO 1 14 VIOLET (SUTURE) ×2 IMPLANT
SYR 20ML LL LF (SYRINGE) ×4 IMPLANT
TOWEL OR 17X26 10 PK STRL BLUE (TOWEL DISPOSABLE) ×2 IMPLANT
TRAY FOLEY MTR SLVR 16FR STAT (SET/KITS/TRAYS/PACK) ×2 IMPLANT
TUBE SUCTION HIGH CAP CLEAR NV (SUCTIONS) ×2 IMPLANT
UNDERPAD 30X36 HEAVY ABSORB (UNDERPADS AND DIAPERS) ×2 IMPLANT
WATER STERILE IRR 1000ML POUR (IV SOLUTION) ×4 IMPLANT

## 2022-07-01 NOTE — Assessment & Plan Note (Signed)
Advanced dementia that has been progressively worsening over the past several years Family reports a dramatic worsening in cognitive function and confusion since original insult to the hip in early April Family has many questions surrounding rapid decline and therefore we have initiated a encephalopathy workup to identify any reversible causes.   Vitamin B12, TSH, ammonia, hepatic function panel have all been unremarkable No clinical evidence of infection CT imaging of the head revealing substantial atrophy, EEG unremarkable.

## 2022-07-01 NOTE — Assessment & Plan Note (Signed)
Continue outpatient regimen of Sinemet Follows as an outpatient with Dr. Karel Jarvis with neurology

## 2022-07-01 NOTE — Assessment & Plan Note (Signed)
Present on admission Identified on initial skin evaluation Wound care consultation for daily dressing changes

## 2022-07-01 NOTE — Anesthesia Postprocedure Evaluation (Signed)
Anesthesia Post Note  Patient: Joe Murray  Procedure(s) Performed: LEFT CONVERSION TO hemi HIP, REMOVAL OF CANNULATED SCREWS (Left: Hip)     Patient location during evaluation: PACU Anesthesia Type: General Level of consciousness: awake and confused (at baseline) Pain management: pain level controlled Vital Signs Assessment: post-procedure vital signs reviewed and stable Respiratory status: spontaneous breathing, nonlabored ventilation, respiratory function stable and patient connected to nasal cannula oxygen Cardiovascular status: blood pressure returned to baseline and stable Postop Assessment: no apparent nausea or vomiting Anesthetic complications: no   No notable events documented.  Last Vitals:  Vitals:   07/01/22 1515 07/01/22 1534  BP: (!) 127/59 (!) 163/76  Pulse: (!) 50 (!) 59  Resp: 11 14  Temp: 36.8 C 36.7 C  SpO2: 95% 94%    Last Pain:  Vitals:   07/01/22 1534  TempSrc: Oral                 Beryle Lathe

## 2022-07-01 NOTE — Assessment & Plan Note (Signed)
Strict intake and output monitoring Creatinine near baseline Minimizing nephrotoxic agents as much as possible Serial chemistries to monitor renal function and electrolytes  

## 2022-07-01 NOTE — Anesthesia Preprocedure Evaluation (Addendum)
Anesthesia Evaluation  Patient identified by MRN, date of birth, ID band Patient confused  General Assessment Comment: At baseline per guardian  Reviewed: Allergy & Precautions, NPO status , Patient's Chart, lab work & pertinent test results  History of Anesthesia Complications Negative for: history of anesthetic complications  Airway Mallampati: III  TM Distance: >3 FB Neck ROM: Full    Dental  (+) Dental Advisory Given, Poor Dentition, Missing, Chipped   Pulmonary Current Smoker and Patient abstained from smoking.   Pulmonary exam normal        Cardiovascular hypertension, Pt. on medications + CAD, + Past MI and + Cardiac Stents  Normal cardiovascular exam+ dysrhythmias Supra Ventricular Tachycardia    '19 Exercise Stress -  Blood pressure demonstrated a normal response to exercise. There was no ST segment deviation noted during stress. No T wave inversion was noted during stress. The patient requested the test to be stopped. The test was stopped because the patient complained of fatigue and shortness of breath. Study is suggestive of chronotropic incompetence Overall, the patient's exercise capacity was severely impaired Poor exercise tolerance. HR increased to low 90's (from 50's with max exercise). Noted to be symptomatic with this. Findings suggestive of chronotropic incompetence.     Neuro/Psych  PSYCHIATRIC DISORDERS     Dementia  Alzheimer's dementia with coexisting Parkinson's disease   Parkinson's disease     GI/Hepatic Neg liver ROS,GERD  Controlled and Medicated,,  Endo/Other  diabetes, Type 2, Oral Hypoglycemic Agents    Renal/GU negative Renal ROS     Musculoskeletal negative musculoskeletal ROS (+)    Abdominal   Peds  Hematology  (+) Blood dyscrasia, anemia   Anesthesia Other Findings   Reproductive/Obstetrics                             Anesthesia  Physical Anesthesia Plan  ASA: 4  Anesthesia Plan: General   Post-op Pain Management: Tylenol PO (pre-op)*   Induction: Intravenous  PONV Risk Score and Plan: 2 and Treatment may vary due to age or medical condition, Ondansetron and TIVA  Airway Management Planned: Oral ETT  Additional Equipment: None  Intra-op Plan:   Post-operative Plan: Extubation in OR  Informed Consent: I have reviewed the patients History and Physical, chart, labs and discussed the procedure including the risks, benefits and alternatives for the proposed anesthesia with the patient or authorized representative who has indicated his/her understanding and acceptance.   Patient has DNR.  Discussed DNR with power of attorney.   Dental advisory given and Consent reviewed with POA  Plan Discussed with: CRNA and Anesthesiologist  Anesthesia Plan Comments: (Discussed DNR with guardian at bedside. Ok for intubation (as part of the planned anesthetic), IV fluids, vasopressors, defibrillation. Does not want compressions under any circumstance.)        Anesthesia Quick Evaluation

## 2022-07-01 NOTE — Assessment & Plan Note (Addendum)
Increasing hip pain over the past 4 days patient has been in visible discomfort and unable to ambulate  Left femoral head fracture noted on radiographs consistent with nonunion Considering patient's advanced dementia and comorbidities family was initially somewhat apprehensive about undergoing repair but after several discussions they are now agreeable and Dr. Blanchie Dessert to proceed with operative intervention on 5/8 Scheduling Tylenol twice daily Providing patient with additional as needed  opiate-based analgesics  Obtaining vitamin D level

## 2022-07-01 NOTE — Evaluation (Signed)
SLP Cancellation Note  Patient Details Name: Joe Murray MRN: [id number] DOB: January 27, 1941   Cancelled treatment:       Reason Eval/Treat Not Completed: Other (comment);Medical issues which prohibited therapy (pt npo for orthopedic surgery, will continue efforts to conduct swallow eval as ordered)Will likely see pt 07/02/2022 due to pt's surgery.   Rolena Infante, MS [facility] SLP Acute Rehab Services Office [phone]  Chales Abrahams 07/01/2022, 7:15 AM

## 2022-07-01 NOTE — Plan of Care (Signed)
  Problem: Nutritional: Goal: Maintenance of adequate nutrition will improve Outcome: Progressing   Problem: Clinical Measurements: Goal: Will remain free from infection Outcome: Progressing   Problem: Pain Managment: Goal: General experience of comfort will improve Outcome: Progressing

## 2022-07-01 NOTE — Assessment & Plan Note (Signed)
Patient is on Repatha as an outpatient

## 2022-07-01 NOTE — Transfer of Care (Signed)
Immediate Anesthesia Transfer of Care Note  Patient: Joe Murray  Procedure(s) Performed: LEFT CONVERSION TO hemi HIP, REMOVAL OF CANNULATED SCREWS (Left: Hip)  Patient Location: PACU  Anesthesia Type:General  Level of Consciousness: drowsy  Airway & Oxygen Therapy: Patient Spontanous Breathing and Patient connected to face mask oxygen  Post-op Assessment: Report given to RN and Post -op Vital signs reviewed and stable  Post vital signs: Reviewed and stable  Last Vitals:  Vitals Value Taken Time  BP 163/148 07/01/22 1413  Temp    Pulse    Resp 13 07/01/22 1415  SpO2    Vitals shown include unvalidated device data.  Last Pain:  Vitals:   07/01/22 0942  TempSrc: Axillary      Patients Stated Pain Goal: 0 (06/30/22 2126)  Complications: No notable events documented.

## 2022-07-01 NOTE — Discharge Instructions (Signed)
INSTRUCTIONS AFTER JOINT REPLACEMENT   Remove items at home which could result in a fall. This includes throw rugs or furniture in walking pathways ICE to the affected joint every three hours while awake for 30 minutes at a time, for at least the first 3-5 days, and then as needed for pain and swelling.  Continue to use ice for pain and swelling. You may notice swelling that will progress down to the foot and ankle.  This is normal after surgery.  Elevate your leg when you are not up walking on it.   Continue to use the breathing machine you got in the hospital (incentive spirometer) which will help keep your temperature down.  It is common for your temperature to cycle up and down following surgery, especially at night when you are not up moving around and exerting yourself.  The breathing machine keeps your lungs expanded and your temperature down.   DIET:  As you were doing prior to hospitalization, we recommend a well-balanced diet.  DRESSING / WOUND CARE / SHOWERING  Keep the surgical dressing until follow up.  The dressing is water proof, so you can shower without any extra covering.  IF THE DRESSING FALLS OFF or the wound gets wet inside, change the dressing with sterile gauze.  Please use good hand washing techniques before changing the dressing.  Do not use any lotions or creams on the incision until instructed by your surgeon.    ACTIVITY  Increase activity slowly as tolerated, but follow the weight bearing instructions below.   No driving for 6 weeks or until further direction given by your physician.  You cannot drive while taking narcotics.  No lifting or carrying greater than 10 lbs. until further directed by your surgeon. Avoid periods of inactivity such as sitting longer than an hour when not asleep. This helps prevent blood clots.  You may return to work once you are authorized by your doctor.     WEIGHT BEARING   Weight bearing as tolerated with assist device (walker, cane,  etc) as directed, use it as long as suggested by your surgeon or therapist, typically at least 4-6 weeks.   EXERCISES  Results after joint replacement surgery are often greatly improved when you follow the exercise, range of motion and muscle strengthening exercises prescribed by your doctor. Safety measures are also important to protect the joint from further injury. Any time any of these exercises cause you to have increased pain or swelling, decrease what you are doing until you are comfortable again and then slowly increase them. If you have problems or questions, call your caregiver or physical therapist for advice.   Rehabilitation is important following a joint replacement. After just a few days of immobilization, the muscles of the leg can become weakened and shrink (atrophy).  These exercises are designed to build up the tone and strength of the thigh and leg muscles and to improve motion. Often times heat used for twenty to thirty minutes before working out will loosen up your tissues and help with improving the range of motion but do not use heat for the first two weeks following surgery (sometimes heat can increase post-operative swelling).   These exercises can be done on a training (exercise) mat, on the floor, on a table or on a bed. Use whatever works the best and is most comfortable for you.    Use music or television while you are exercising so that the exercises are a pleasant break in your   day. This will make your life better with the exercises acting as a break in your routine that you can look forward to.   Perform all exercises about fifteen times, three times per day or as directed.  You should exercise both the operative leg and the other leg as well.  Exercises include:   Quad Sets - Tighten up the muscle on the front of the thigh (Quad) and hold for 5-10 seconds.   Straight Leg Raises - With your knee straight (if you were given a brace, keep it on), lift the leg to 60  degrees, hold for 3 seconds, and slowly lower the leg.  Perform this exercise against resistance later as your leg gets stronger.  Leg Slides: Lying on your back, slowly slide your foot toward your buttocks, bending your knee up off the floor (only go as far as is comfortable). Then slowly slide your foot back down until your leg is flat on the floor again.  Angel Wings: Lying on your back spread your legs to the side as far apart as you can without causing discomfort.  Hamstring Strength:  Lying on your back, push your heel against the floor with your leg straight by tightening up the muscles of your buttocks.  Repeat, but this time bend your knee to a comfortable angle, and push your heel against the floor.  You may put a pillow under the heel to make it more comfortable if necessary.   A rehabilitation program following joint replacement surgery can speed recovery and prevent re-injury in the future due to weakened muscles. Contact your doctor or a physical therapist for more information on knee rehabilitation.    CONSTIPATION  Constipation is defined medically as fewer than three stools per week and severe constipation as less than one stool per week.  Even if you have a regular bowel pattern at home, your normal regimen is likely to be disrupted due to multiple reasons following surgery.  Combination of anesthesia, postoperative narcotics, change in appetite and fluid intake all can affect your bowels.   YOU MUST use at least one of the following options; they are listed in order of increasing strength to get the job done.  They are all available over the counter, and you may need to use some, POSSIBLY even all of these options:    Drink plenty of fluids (prune juice may be helpful) and high fiber foods Colace 100 mg by mouth twice a day  Senokot for constipation as directed and as needed Dulcolax (bisacodyl), take with full glass of water  Miralax (polyethylene glycol) once or twice a day as  needed.  If you have tried all these things and are unable to have a bowel movement in the first 3-4 days after surgery call either your surgeon or your primary doctor.    If you experience loose stools or diarrhea, hold the medications until you stool forms back up.  If your symptoms do not get better within 1 week or if they get worse, check with your doctor.  If you experience "the worst abdominal pain ever" or develop nausea or vomiting, please contact the office immediately for further recommendations for treatment.   ITCHING:  If you experience itching with your medications, try taking only a single pain pill, or even half a pain pill at a time.  You can also use Benadryl over the counter for itching or also to help with sleep.   TED HOSE STOCKINGS:  Use stockings on both   legs until for at least 2 weeks or as directed by physician office. They may be removed at night for sleeping.  MEDICATIONS:  See your medication summary on the "After Visit Summary" that nursing will review with you.  You may have some home medications which will be placed on hold until you complete the course of blood thinner medication.  It is important for you to complete the blood thinner medication as prescribed.   Blood clot prevention (DVT Prophylaxis): After surgery you are at an increased risk for a blood clot. you were prescribed a blood thinner, Lovenox, to be taken daily for a total of 4 weeks from surgery to help reduce your risk of getting a blood clot. This will help prevent a blood clot. Signs of a pulmonary embolus (blood clot in the lungs) include sudden short of breath, feeling lightheaded or dizzy, chest pain with a deep breath, rapid pulse rapid breathing. Signs of a blood clot in your arms or legs include new unexplained swelling and cramping, warm, red or darkened skin around the painful area. Please call the office or 911 right away if these signs or symptoms develop.  PRECAUTIONS:  If you experience  chest pain or shortness of breath - call 911 immediately for transfer to the hospital emergency department.   If you develop a fever greater that 101 F, purulent drainage from wound, increased redness or drainage from wound, foul odor from the wound/dressing, or calf pain - CONTACT YOUR SURGEON.                                                   FOLLOW-UP APPOINTMENTS:  If you do not already have a post-op appointment, please call the office for an appointment to be seen by your surgeon.  Guidelines for how soon to be seen are listed in your "After Visit Summary", but are typically between 2-3 weeks after surgery.   POST-OPERATIVE OPIOID TAPER INSTRUCTIONS: It is important to wean off of your opioid medication as soon as possible. If you do not need pain medication after your surgery it is ok to stop day one. Opioids include: Codeine, Hydrocodone(Norco, Vicodin), Oxycodone(Percocet, oxycontin) and hydromorphone amongst others.  Long term and even short term use of opiods can cause: Increased pain response Dependence Constipation Depression Respiratory depression And more.  Withdrawal symptoms can include Flu like symptoms Nausea, vomiting And more Techniques to manage these symptoms Hydrate well Eat regular healthy meals Stay active Use relaxation techniques(deep breathing, meditating, yoga) Do Not substitute Alcohol to help with tapering If you have been on opioids for less than two weeks and do not have pain than it is ok to stop all together.  Plan to wean off of opioids This plan should start within one week post op of your joint replacement. Maintain the same interval or time between taking each dose and first decrease the dose.  Cut the total daily intake of opioids by one tablet each day Next start to increase the time between doses. The last dose that should be eliminated is the evening dose.   MAKE SURE YOU:  Understand these instructions.  Get help right away if you are  not doing well or get worse.    Thank you for letting us be a part of your medical care team.  It is a privilege we respect  greatly.  We hope these instructions will help you stay on track for a fast and full recovery!

## 2022-07-01 NOTE — Progress Notes (Signed)
     Subjective: Patient lying comfortably in bed, not cooperating with questions secondary to baseline dementia.  Wife at bedside.  Questions regarding surgery this afternoon were answered.  Objective:   VITALS:   Vitals:   06/30/22 1314 06/30/22 2058 07/01/22 0428 07/01/22 0942  BP: 133/64 (!) 121/102 130/80 116/80  Pulse: 87 68 68 62  Resp: 16 18 17    Temp: 97.6 F (36.4 C) 99.5 F (37.5 C) 98.2 F (36.8 C) 98.5 F (36.9 C)  TempSrc: Axillary Oral Oral Axillary  SpO2: 96% 97% 97% 95%  Weight:      Height:       Well-healed lateral hip incision.  Discomfort with manipulation or range of motion of the left hip.  Mild discomfort with manipulation of the right lower extremity as well as the left knee also.  No significant distal edema.  Not cooperating with motor or sensory exam.   Lab Results  Component Value Date   WBC 13.6 (H) 07/01/2022   HGB 11.6 (L) 07/01/2022   HCT 36.8 (L) 07/01/2022   MCV 99.2 07/01/2022   PLT 262 07/01/2022   BMET    Component Value Date/Time   NA 142 07/01/2022 0358   NA 142 06/01/2019 0824   K 3.9 07/01/2022 0358   CL 109 07/01/2022 0358   CO2 25 07/01/2022 0358   GLUCOSE 128 (H) 07/01/2022 0358   BUN 24 (H) 07/01/2022 0358   BUN 22 06/01/2019 0824   CREATININE 1.13 07/01/2022 0358   CREATININE 1.26 (H) 11/07/2019 1048   CALCIUM 9.1 07/01/2022 0358   GFRNONAA >60 07/01/2022 0358     Assessment/Plan: Day of Surgery   Principal Problem:   Fracture of left femur with nonunion Active Problems:   GERD   Type 2 diabetes mellitus with stage 3a chronic kidney disease, without long-term current use of insulin (HCC)   Coronary artery disease involving native coronary artery of native heart without angina pectoris   Parkinson's disease   Alzheimer's dementia (HCC)   Chronic kidney disease, stage 3a (HCC)   Unstageable pressure ulcer of left heel (HCC)   Mixed diabetic hyperlipidemia associated with type 2 diabetes mellitus (HCC)    Pressure injury of left ankle, stage 2 (HCC)   Goals of care, counseling/discussion   Folate deficiency  Left femoral neck fracture nonunion   Discussed again with the wife today that goals of surgery to hopefully reduce his pain which may allow him to better mobilize out of bed.  He has become increasingly delirious and concerned this may be related to the collapse of his femoral neck fracture now with malunion as well as nonunion.  The risks benefits and alternatives were discussed with the patient including but not limited to the risks of nonoperative treatment, versus surgical intervention including infection, bleeding, nerve injury, periprosthetic fracture, the need for revision surgery, dislocation, leg length discrepancy, blood clots, cardiopulmonary complications, morbidity, mortality, among others, and they were willing to proceed.   Consent was signed by myself and the patient.  Left leg was marked.    Xayden Linsey A Zaylah Blecha 07/01/2022, 10:40 AM   Weber Cooks, MD  Contact information:   [phone] 7am-5pm epic message Dr. Blanchie Dessert, or call office for patient follow up: [phone] After hours and holidays please check [email or web address] for group call information for [facility]

## 2022-07-01 NOTE — Progress Notes (Addendum)
PROGRESS NOTE    Joe Murray  JOA:[id number] DOB: 25-Jul-1940 DOA: 06/29/2022 PCP: Nelwyn Salisbury, MD    Brief Narrative:   Joe Murray is a 82 y.o. male with past medical history significant for dementia, Parkinson's disease, CKD stage IIIa, non-insulin-dependent DM 2, HTN, CAD (PCI 02/2015 with DES to RCA and mid-LAD), GERD and recent hospitalization for left femoral neck fracture status post percutaneous pinning by Dr. Blanchie Dessert 05/27/2022 who presented to [facility] ED on 5/6 with left hip pain and inability to ambulate.  Following his recent surgery, patient was discharged to [facility] for rehabilitation.  Of note, family reports declining cognition since April.  Patient unable to provide any further history due to his dementia.  In the ED, temperature 98.9 F, HR 65, RR 26, BP 139/67, SpO2 96% on room air.  WBC 16.2, hemoglobin 13.0, platelets 270.  Sodium 138, potassium 4.3, chloride 104, CO2 25, glucose 25, BUN 28, creatinine 1.26.  AST 35, ALT 16, total bilirubin 0.7.  Imaging notable for shortening of the left femoral neck with impaction.  Orthopedics was consulted and TR[facility] consulted for admission for further evaluation and management.  Assessment & Plan:   Left femoral neck fracture Patient with increased left hip pain over the previous 4 days prior to admission with inability to ambulate.  Underwent recent percutaneous pinning by Dr. Blanchie Dessert on 4/3 for left femoral neck fracture.  Imaging notable for left femoral head fracture with nonunion.  Patient's family initially apprehensive about undergoing repair but after discussions with orthopedics they were agreeable to proceed and patient underwent removal of cannulated screws and conversion to left hip bipolar hemiarthroplasty by Dr. Blanchie Dessert on 07/01/2022. -- Orthopedics following, appreciate assistance -- Continue antibiotics with Ancef x 3 days, transition to cefadroxil 500mg  BID x 7 days per orthoipedics --  WBAT with posterior hip precautions x 6 weeks -- Lovenox for postoperative DVT prophylaxis x 4 weeks per orthopedics -- Tylenol 1 g p.o. every 6 hours scheduled -- Toradol 7.5 mg IV every 6 hours scheduled x 4 doses -- Oxycodone 5-10 mg every 4 hours as needed moderate pain -- Dilaudid 0.5-1 mg IV every 4 hours as needed severe pain not relieved with oral medication -- Robaxin 500 mg p.o./IV every 6 hours.  Muscle spasms -- PT/OT evaluation -- Follow-up orthopedics 2 weeks for wound check  Essential hypertension CAD Currently not on antihypertensives outpatient. -- Hydralazine 5 mg IV q6h PRN SBP >165  Type 2 diabetes mellitus Hemoglobin A1c 7.2.  Home medications include glipizide 5 mg p.o. twice daily. -- Hold oral hypoglycemics while inpatient -- SSI for coverage -- CBGs qAC/HS  Parkinson disease/dementia -- Namenda 10 mg p.o. twice daily -- Sinemet 3 times daily -- Seroquel 75 mg p.o. nightly -- Melatonin 3 mg p.o. nightly  Goals of care Patient with significant decline since initial hip fracture in April 2024 now with recurrent hospitalization for nonunion with additional repair in the setting of advanced dementia.  Palliative care consulted for assistance with goals of care/medical decision making.  Overall long-term prognosis remains poor given his comorbidities, advanced age.    DVT prophylaxis: SCDs Start: 06/29/22 1210    Code Status: DNR Family Communication: Updated spouse present at bedside this afternoon  Disposition Plan:  Level of care: Med-Surg Status is: Inpatient Remains inpatient appropriate because: Operative management for nonunion of left hip fracture today, will need repeat therapy evaluation and anticipate likely return to SNF.    Consultants:  Orthopedics, Dr. Blanchie Dessert Palliative care  Procedures:  removal of cannulated screws and conversion to left hip bipolar hemiarthroplasty by Dr. Blanchie Dessert on 07/01/2022  Antimicrobials:  Cefazolin  5/8>>   Subjective: Seen and examined at bedside, sleeping/somnolent.  Spouse present at bedside.  Just returned from PACU from operative management of his left hip fracture with nonunion.  Unable to obtain any further ROS from patient due to his current mental status.  Spouse appreciative that he was able to "get through the surgery".  Wife without any other questions at this time, appreciative all the care that her husband is receiving in the hospital.  No acute concerns overnight per nursing staff.  Objective: Vitals:   07/01/22 1445 07/01/22 1500 07/01/22 1515 07/01/22 1534  BP: (!) 147/95 (!) 170/69 (!) 127/59 (!) 163/76  Pulse: (!) 52 (!) 59 (!) 50 (!) 59  Resp: 13 19 11 14   Temp:   98.2 F (36.8 C) 98.1 F (36.7 C)  TempSrc:    Oral  SpO2: 97% 95% 95% 94%  Weight:      Height:        Intake/Output Summary (Last 24 hours) at 07/01/2022 1646 Last data filed at 07/01/2022 1415 Gross per 24 hour  Intake 3300.58 ml  Output 1375 ml  Net 1925.58 ml   Filed Weights   06/29/22 0855 06/30/22 1134  Weight: 98.5 kg 85.9 kg    Examination:  Physical Exam: GEN: NAD, pleasantly confused, somnolent, elderly/chronically ill in appearance HEENT: NCAT, PERRL, EOMI, sclera clear, MMM PULM: CTAB w/o wheezes/crackles, normal respiratory effort, on room air CV: RRR w/o M/G/R GI: abd soft, NTND, NABS, no R/G/M MSK: no peripheral edema    Data Reviewed: I have personally reviewed following labs and imaging studies  CBC: Recent Labs  Lab 06/29/22 1004 07/01/22 0358  WBC 16.2* 13.6*  NEUTROABS 13.2* 10.5*  HGB 13.0 11.6*  HCT 40.8 36.8*  MCV 96.0 99.2  PLT 270 262   Basic Metabolic Panel: Recent Labs  Lab 06/29/22 1004 07/01/22 0358  NA 138 142  K 4.3 3.9  CL 104 109  CO2 25 25  GLUCOSE 185* 128*  BUN 28* 24*  CREATININE 1.26* 1.13  CALCIUM 8.9 9.1  MG 2.1 2.0   GFR: Estimated Creatinine Clearance: 53.8 mL/min (by C-G formula based on SCr of 1.13 mg/dL). Liver  Function Tests: Recent Labs  Lab 06/29/22 1004 07/01/22 0358  AST 35 39  ALT 16 7  ALKPHOS 92 106  BILITOT 0.7 0.8  PROT 7.0 6.4*  ALBUMIN 3.2* 2.5*   No results for input(s): "LIPASE", "AMYLASE" in the last 168 hours. Recent Labs  Lab 06/30/22 1033  AMMONIA 23   Coagulation Profile: Recent Labs  Lab 07/01/22 0358  INR 1.1   Cardiac Enzymes: No results for input(s): "CKTOTAL", "CKMB", "CKMBINDEX", "TROPONINI" in the last 168 hours. BNP (last 3 results) No results for input(s): "PROBNP" in the last 8760 hours. HbA1C: Recent Labs    06/29/22 1004  HGBA1C 7.2*   CBG: Recent Labs  Lab 06/30/22 1717 07/01/22 0000 07/01/22 0734 07/01/22 1034 07/01/22 1418  GLUCAP 116* 152* 132* 130* 148*   Lipid Profile: No results for input(s): "CHOL", "HDL", "LDLCALC", "TRIG", "CHOLHDL", "LDLDIRECT" in the last 72 hours. Thyroid Function Tests: Recent Labs    06/30/22 1037  TSH 1.310   Anemia Panel: Recent Labs    06/30/22 1037  VITAMINB12 1,034*  FOLATE 4.3*   Sepsis Labs: No results for input(s): "PROCALCITON", "LATICACIDVEN" in the last  168 hours.  Recent Results (from the past 240 hour(s))  Surgical PCR screen     Status: None   Collection Time: 06/30/22  9:46 PM   Specimen: Nasal Mucosa; Nasal Swab  Result Value Ref Range Status   MRSA, PCR NEGATIVE NEGATIVE Final   Staphylococcus aureus NEGATIVE NEGATIVE Final    Comment: (NOTE) The Xpert SA Assay (FDA approved for NASAL specimens in patients 72 years of age and older), is one component of a comprehensive surveillance program. It is not intended to diagnose infection nor to guide or monitor treatment. Performed at [facility], [address] [place], [place] [address]          Radiology Studies: DG HIP UNILAT W OR W/O PELVIS 2-3 VIEWS LEFT  Result Date: 07/01/2022 CLINICAL DATA:  Postop left hip. EXAM: DG HIP (WITH OR WITHOUT PELVIS) 2-3V LEFT COMPARISON:  Radiograph  06/29/2022 FINDINGS: Left hip arthroplasty in expected alignment. No periprosthetic lucency or fracture. Recent postsurgical change includes air and edema in the soft tissues. IMPRESSION: Left hip arthroplasty without immediate postoperative complication. Electronically Signed   By: Narda Rutherford M.D.   On: 07/01/2022 15:19   EEG adult  Result Date: 06/30/2022 Charlsie Quest, MD     06/30/2022  5:20 PM Patient Name: DONNIE GHILARDI MRN: [id number] Epilepsy Attending: Charlsie Quest Referring Physician/Provider: Marinda Elk, MD Date:  06/30/2022 Duration: 25.54 mins Patient history: 82yo M with ams getting eeg to evaluate for seizure Level of alertness: Awake AEDs during EEG study: None Technical aspects: This EEG study was done with scalp electrodes positioned according to the 10-20 International system of electrode placement. Electrical activity was reviewed with band pass filter of 1-70Hz , sensitivity of 7 uV/mm, display speed of 87mm/sec with a 60Hz  notched filter applied as appropriate. EEG data were recorded continuously and digitally stored.  Video monitoring was available and reviewed as appropriate. Description: EEG showed continuous generalized 3 to 6 Hz theta-delta slowing.  Hyperventilation and photic stimulation were not performed.   ABNORMALITY - Continuous slow, generalized IMPRESSION: This study is suggestive of moderate diffuse encephalopathy, nonspecific etiology. No seizures or epileptiform discharges were seen throughout the recording. Priyanka Annabelle Harman   CT HEAD WO CONTRAST ( )  Result Date: 06/30/2022 CLINICAL DATA:  Delirium. EXAM: CT HEAD WITHOUT CONTRAST TECHNIQUE: Contiguous axial images were obtained from the base of the skull through the vertex without intravenous contrast. RADIATION DOSE REDUCTION: This exam was performed according to the departmental dose-optimization program which includes automated exposure control, adjustment of the mA and/or kV according to patient  size and/or use of iterative reconstruction technique. COMPARISON:  Head CT May 25, 2022. FINDINGS: Brain: No evidence of acute infarction, hemorrhage, hydrocephalus, extra-axial collection or mass lesion/mass effect. Patchy hypodensity of the periventricular white matter, nonspecific, most likely related to chronic microangiopathy. Moderate parenchymal volume loss. Vascular: No hyperdense vessel. Calcified plaques in the bilateral carotid siphons. Skull: Normal. Negative for fracture or focal lesion. Sinuses/Orbits: No acute finding.1 Other: None. IMPRESSION: 1. No acute intracranial abnormality. 2. Moderate parenchymal volume loss and chronic microangiopathy of the white matter. Electronically Signed   By: Baldemar Lenis M.D.   On: 06/30/2022 15:17        Scheduled Meds:  acetaminophen  1,000 mg Oral Q6H   acetaminophen  650 mg Oral BID   carbidopa-levodopa  1 tablet Oral TID   cyanocobalamin  1,000 mcg Oral Daily   feeding supplement  237 mL Oral BID  BM   [START ON 07/02/2022] folic acid  1 mg Intravenous Daily   insulin aspart  0-9 Units Subcutaneous TID WC   ketorolac  7.5 mg Intravenous Q6H   magnesium chloride  256 mg Oral Daily   melatonin  3 mg Oral QHS   memantine  10 mg Oral BID   multivitamin with minerals  1 tablet Oral Daily   prednisoLONE acetate  1 drop Right Eye Daily   QUEtiapine  75 mg Oral QHS   senna-docusate  1 tablet Oral BID   Continuous Infusions:  sodium chloride 50 mL/hr at 06/29/22 1701    ceFAZolin (ANCEF) IV     methocarbamol (ROBAXIN) IV       LOS: 2 days    Time spent: 51 minutes spent on chart review, discussion with nursing staff, consultants, updating family and interview/physical exam; more than 50% of that time was spent in counseling and/or coordination of care.    Alvira Philips [place], DO [facility] Available via Epic secure chat 7am-7pm After these hours, please refer to coverage provider listed on [email or web address] 07/01/2022,  4:46 PM

## 2022-07-01 NOTE — Assessment & Plan Note (Signed)
Patient is not complaining of chest pain EKG reveals no dynamic ST segment change As needed nitroglycerin for bouts of chest pain

## 2022-07-01 NOTE — Assessment & Plan Note (Signed)
Intravenous folic acid for now which can later be transitioned to oral

## 2022-07-01 NOTE — Assessment & Plan Note (Signed)
Present on admission Identified on initial skin evaluation Wound care consultation for daily dressing changes 

## 2022-07-01 NOTE — Anesthesia Procedure Notes (Signed)
Procedure Name: Intubation Date/Time: 07/01/2022 11:35 AM  Performed by: Florene Route, CRNAPre-anesthesia Checklist: Patient identified, Emergency Drugs available, Suction available and Patient being monitored Patient Re-evaluated:Patient Re-evaluated prior to induction Oxygen Delivery Method: Circle system utilized Preoxygenation: Pre-oxygenation with 100% oxygen Induction Type: IV induction Ventilation: Mask ventilation without difficulty Laryngoscope Size: Miller and 3 Grade View: Grade I Tube type: Oral Tube size: 8.0 mm Number of attempts: 1 Airway Equipment and Method: Stylet and Oral airway Placement Confirmation: ETT inserted through vocal cords under direct vision, positive ETCO2 and breath sounds checked- equal and bilateral Secured at: 22 cm Tube secured with: Tape Dental Injury: Teeth and Oropharynx as per pre-operative assessment

## 2022-07-01 NOTE — Op Note (Signed)
07/01/2022  2:13 PM  PATIENT:  Joe Murray   MRN: [id number]  PRE-OPERATIVE DIAGNOSIS: Left femoral neck fracture nonunion  POST-OPERATIVE DIAGNOSIS: Same  PROCEDURE:  Procedure(s): Removal of cannulated screws, conversion to left hip bipolar hemiarthroplasty  PREOPERATIVE INDICATIONS:  Joe Murray is an 82 y.o. male who was admitted 06/29/2022 who had originally sustained a nondisplaced femoral neck fracture 05/25/2022 treated with cannulated screw fixation.  Patient has history of Lewy body dementia at baseline.  Postoperatively he did reportedly with progressively worsening pain, confusion, decreased function.  In the past week he has been especially immobile essentially bedbound with significant discomfort with left leg manipulation.  Repeat x-rays of the left hip and pelvis demonstrate significant loss of reduction from his 2-week postop x-rays.  Given concern that he had developed a nonunion and malunion which was contributing to progressively worsening delirium and pain elected to convert to arthroplasty with removal of cannulated screws.  The risks benefits and alternatives were discussed with the patient including but not limited to the risks of nonoperative treatment, versus surgical intervention including infection, bleeding, nerve injury, periprosthetic fracture, the need for revision surgery, dislocation, leg length discrepancy, blood clots, cardiopulmonary complications, morbidity, mortality, among others, and they were willing to proceed.  Predicted outcome is good, although there will be at least a six to nine month expected recovery.   OPERATIVE REPORT     SURGEON:  Weber Cooks, MD    ASSISTANT: Kathie Dike, PA-C (Present throughout the entire procedure,  necessary for completion of procedure in a timely manner, assisting with retraction, instrumentation, and closure)     ANESTHESIA: General  ESTIMATED BLOOD LOSS: 250cc    COMPLICATIONS:  None.   UNIQUE  ASPECTS OF THE CASE: Significant shortening and varus collapse of femoral neck fracture intact hardware, intact cartilage of both the femoral head and acetabular side.  Given patient's poor function over the last month and concern for poor bone quality with otherwise pristine acetabular cartilage elected for hemiarthroplasty as opposed to total of arthroplasty as it is not less invasive and  has better stability and should give the patient adequate pain control.     COMPONENTS:  Stryker Accolade C size 6 with 127 degree neck angle cemented femoral prosthesis, 52 x 28 mm bipolar head, 28+0 cobalt chrome inner head, Simplex low viscosity cement, 2 batches Implant Name Type Inv. Item Serial No. Manufacturer Lot No. LRB No. Used Action  CEMENT RESTRICTOR BONE PREP ST - [id number] Cement CEMENT RESTRICTOR BONE PREP ST  STRYKER INSTRUMENTS [id number] Left 1 Implanted  SCREW CANN 8.0X100 HIP - [id number] Screw SCREW CANN 8.0X100 HIP  ZIMMER RECON(ORTH,TRAU,BIO,SG) NONE Left 1 Explanted  SCREW PARTIAL THREAD 8.0X90MM - [id number] Screw SCREW PARTIAL THREAD 8.0X90MM  ZIMMER RECON(ORTH,TRAU,BIO,SG) NONE Left 2 Explanted  WASHER CANN FLAT 8 - [id number] Washer WASHER CANN FLAT 8  ZIMMER RECON(ORTH,TRAU,BIO,SG) NONE Left 2 Explanted  STEM FEM CEMT 49X158 SZ6 127D - [id number] Stem STEM FEM CEMT 49X158 SZ6 127D  STRYKER ORTHOPEDICS [id number] Left 1 Implanted  HEAD BIPOLAR LOCK UHR 28X52 - [id number] Head HEAD BIPOLAR LOCK UHR 28X52  STRYKER ORTHOPEDICS [id number] Left 1 Implanted  FEMORAL HEAD LFIT V40 PL0 - [id number] Orthopedic Implant FEMORAL HEAD LFIT V40 PL0  STRYKER ORTHOP[employer] [id number] Left 1 Implanted    The aquamantis was utilized for this case to help facilitate better hemostasis as patient was felt to be at increased risk of bleeding because of revision surgery, complex case requiring increased OR time  and/or exposure.       PROCEDURE IN DETAIL: The patient was met in the holding area and  identified.  The appropriate hip  was marked at the operative site. The patient was then transported to the OR and  placed under anesthesia.  At that point, the patient was  placed in the lateral decubitus position with the operative side up and  secured to the operating room table and all bony prominences padded. A subaxillary role was placed.    The operative lower extremity was prepped from the iliac crest to the ankle.  Sterile draping was performed.  2g of ancef and 1g TXA were given prior to incision. Time out was performed prior to incision.      A routine posterolateral approach was utilized via sharp dissection  carried down to the subcutaneous tissue.  Gross bleeders were Bovie  coagulated.  The iliotibial band was identified and incised  along the length of the skin incision.  A Charnley retractor was inserted with care to protect the sciatic nerve.  With the hip internally rotated, the short external rotators  were identified. The piriformis was tagged with #5 Ethibond, and the hip capsule released in a T-type fashion, and posterior sleeve of the capsule was also tagged.  The hip was carefully dislocated prior to screw removal as to avoid risk of propagation of the fracture down the femoral shaft.  The hip was easily dislocated.  Notably the fracture and fixation appeared stable.  No evidence of infection, no purulence.  The hip was reduced and the cannulated screws which were visualized laterally on the femoral cortex were carefully removed without difficulty including the 2 washers. The hip was redislocated and the femoral neck was cut just below the femoral neck fracture using the cutting jig. This was performed at approximately a thumb's breadth above the lesser trochanter.    I then exposed the deep acetabulum, cleared out any tissue including the ligamentum teres.  The femoral head that was removed was evaluated had intact cartilage throughout, the acetabulum also had healthy intact  cartilage with intact labrum no evidence of degenerative changes.  Given these findings felt the patient would have good pain control and lower overall risk with a hemiarthroplasty as opposed to a total hip arthroplasty.  The femoral head was sized to be 52 mm.    I then prepared the proximal femur using the box cutter, Charnley awl, and then sequentially broached.  A trial utilized, and I reduced the hip, leg lengths were assessed clinically and felt to be equal. The hip was then taken through a full range of motion, the hip was stable at full extension and 90 degrees external rotation without anterior subluxation. The hip was also stable in the position of sleep, and in neutral abduction up to 90 degrees flexion, and 90 degrees IR. The trial components were then removed.   We then prepared canal for cementation.  The cement restrictor was measured and inserted distally.  The canal was then irrigated with the pulse lavage and 3 L of normal saline.  2 bags of Simplex cement were prepared.  Using the cement gun the cement was inserted distally and the canal was filled.  We then pressurized the canal. The real implant was then inserted matching the patient's native anteversion of approximately 20 degrees.  We then waited for 13 minutes for the cement to be fully set.  Excess cement was removed.  A lap was placed in the acetabulum prior  to cementing was also removed and the acetabulum was assessed to make sure there was no cement or bone fragments.  The hip was then reduced with the trial head again and taken through functional range of motion and found to have excellent stability. Leg lengths were restored. The real head was then impacted onto the stem and the hip was again reduced.  The capsule was then repaired with #5 Ethibond., and the piriformis was repaired to the abductor tendon. Excellent posterior capsular repair was achieved.   I then irrigated the hip copiously again with pulse lavage. The  wounds were injected with 20cc exparal diluted in sterile saline. The fascia and IT band was repaired with #1 stratafix, followed by 0 stratafix for the fat layer followed by 2-0 Vicryl and running 3-0 Monocryl for the skin, Dermabond was applied and an Aquacel dressing was placed.  The patient was then awakened and returned to PACU in stable and satisfactory condition. There were no complications.  Post op recs: WB: WBAT with posterior hip precautions x6 weeks Abx: Continue Ancef x 3 days, discharge cefadroxil 500 twice daily x7 days given increased risk of infection due to multiple recent surgeries Imaging: PACU xrays Dressing: Aquacel dressing to be kept intact until follow-up DVT prophylaxis: lovenox starting POD1 x4 weeks Follow up: 2 weeks after surgery for a wound check with Dr. Blanchie Dessert at [facility].  Address: [address], [place], [place] [address]  Office Phone: [phone]   Weber Cooks, MD Orthopedic Surgeon  07/01/2022 2:13 PM

## 2022-07-01 NOTE — Consult Note (Signed)
  Daily Progress Note   Patient Name: Joe Murray       Date: 07/01/2022 DOB: [birth year]  Age: 82 y.o. MRN#: [id number] Attending Physician: [place], Eric J, DO Primary Care Physician: Nelwyn Salisbury, MD Admit Date: 06/29/2022 Length of Stay: 2 days  Palliative medicine team consulted to assist with complex medical decision making. Reviewed EMR and patient planned to go to OR today for left total hip arthroplasty. Family agreed to surgery after discussions with providers. Attempted to see patient in the afternoon though he was still in OR/PACU. No family present in patient's room when visited. PMT will plan to follow up as able to complete full consult. Will likely see patient on 5/9 to complete. Thank you.   Alvester Morin, DO Palliative Care Provider PMT # [phone]

## 2022-07-01 NOTE — Assessment & Plan Note (Signed)
Lengthy discussion concerning patient's overall prognosis had with family Even with operative intervention, it is ultimately unclear what the chances are as to whether patient will substantially improve ability to ambulate and quality of life Considering how guarded the patient's prognosis is family is agreeable to palliative care consultation and consideration of adjusting goals of care going forward Family has confirmed that patient is currently DNR/DNI

## 2022-07-01 NOTE — TOC Initial Note (Signed)
Transition of Care [facility]) - Initial/Assessment Note   Patient Details  Name: Joe Murray MRN: [id number] Date of Birth: [birth year]  Transition of Care [facility]) CM/SW Contact:    Ewing Schlein, LCSW Phone Number: 07/01/2022, 11:40 AM  Clinical Narrative: [facility] consulted for discharge needs. Patient was admitted from [facility], where he was for rehab. Patient is scheduled for surgery today. Awaiting updates following surgery.  Expected Discharge Plan:  (To be determined) Barriers to Discharge: Continued Medical Work up  Expected Discharge Plan and Services In-house Referral: Clinical Social Work Living arrangements for the past 2 months: Single Family Home  Prior Living Arrangements/Services Living arrangements for the past 2 months: Single Family Home Patient language and need for interpreter reviewed:: Yes Need for Family Participation in Patient Care: Yes (Comment) (Patient has dementia and is disoriented x4.) Care giver support system in place?: Yes (comment) Criminal Activity/Legal Involvement Pertinent to Current Situation/Hospitalization: No - Comment as needed  Activities of Daily Living Home Assistive Devices/Equipment: None ADL Screening (condition at time of admission) Patient's cognitive ability adequate to safely complete daily activities?: No Is the patient deaf or have difficulty hearing?: No Does the patient have difficulty seeing, even when wearing glasses/contacts?: No Does the patient have difficulty concentrating, remembering, or making decisions?: Yes Patient able to express need for assistance with ADLs?: No Does the patient have difficulty dressing or bathing?: Yes Independently performs ADLs?: No Communication: Needs assistance Is this a change from baseline?: Pre-admission baseline Dressing (OT): Dependent Is this a change from baseline?: Pre-admission baseline Grooming: Dependent Is this a change from baseline?: Pre-admission baseline Feeding: Needs  assistance Is this a change from baseline?: Pre-admission baseline Toileting: Dependent Is this a change from baseline?: Pre-admission baseline In/Out Bed: Dependent Is this a change from baseline?: Pre-admission baseline Walks in Home: Dependent Is this a change from baseline?: Pre-admission baseline Does the patient have difficulty walking or climbing stairs?: Yes Weakness of Legs: Both Weakness of Arms/Hands: Both  Emotional Assessment Alcohol / Substance Use: Not Applicable Psych Involvement: No (comment)  Admission diagnosis:  Closed left hip fracture, sequela [S72.002S] Acute postoperative pain of left hip [G89.18, M25.552] Patient Active Problem List   Diagnosis Date Noted   Goals of care, counseling/discussion 07/01/2022   Folate deficiency 07/01/2022   Unstageable pressure ulcer of left heel (HCC) 06/30/2022   Mixed diabetic hyperlipidemia associated with type 2 diabetes mellitus (HCC) 06/30/2022   Pressure injury of left ankle, stage 2 (HCC) 06/30/2022   Closed left hip fracture, sequela 06/29/2022   Fracture of left femur with nonunion 05/25/2022   Chronic kidney disease, stage 3a (HCC) 05/25/2022   Pseudophakia of both eyes 05/17/2022   Status post corneal transplant 05/17/2022   Myalgia due to statin 01/27/2021   Parkinson's disease 11/07/2020   Alzheimer's dementia (HCC) 11/07/2020   Intermittent claudication (HCC) 12/01/2017   Trochanteric bursitis of right hip 09/04/2017   Fuchs' corneal dystrophy 08/14/2016   Essential hypertension -> allowing permissive hypertension.  Medication stopped because of fatigue and orthostatic dizziness. 03/05/2015   NSTEMI (non-ST elevated myocardial infarction) [facility])    Coronary artery disease involving native coronary artery of native heart without angina pectoris    Vitamin D deficiency 07/04/2014   Type 2 diabetes mellitus with stage 3a chronic kidney disease, without long-term current use of insulin (HCC)    Chronotropic  incompetence 12/25/2011   Vertigo with mild postural lightheadedness. 08/13/2009   ERECTILE DYSFUNCTION, ORGANIC 05/01/2009   GERD 04/18/2008   BPH with  urinary obstruction 07/27/2007   CAD S/P percutaneous coronary angioplasty - LADx2, & RCA x 3 07/12/2001    Class: History of   PCP:  Nelwyn Salisbury, MD Pharmacy:   [facility] [address], [place] - [address] [place] [address] Phone: [phone] Fax: [phone]  [facility] - [place], [place] - [phone] [facility] [address] [place] [address] Phone: [phone] Fax: [phone]  Social Determinants of Health (SDOH) Social History: SDOH Screenings   Food Insecurity: No Food Insecurity (06/30/2022)  Housing: Low Risk  (06/30/2022)  Transportation Needs: No Transportation Needs (06/30/2022)  Utilities: Not At Risk (06/30/2022)  Alcohol Screen: Low Risk  (03/30/2022)  Depression (PHQ2-9): Low Risk  (03/30/2022)  Financial Resource Strain: Low Risk  (03/30/2022)  Recent Concern: Financial Resource Strain - Medium Risk (01/06/2022)  Physical Activity: Inactive (03/30/2022)  Social Connections: Socially Integrated (03/30/2022)  Stress: No Stress Concern Present (03/30/2022)  Tobacco Use: High Risk (07/01/2022)   SDOH Interventions:    Readmission Risk Interventions    06/01/2022   11:19 AM  Readmission Risk Prevention Plan  Post Dischage Appt Complete  Medication Screening Complete  Transportation Screening Complete

## 2022-07-01 NOTE — Assessment & Plan Note (Signed)
   Intravenous Protonix for now 

## 2022-07-01 NOTE — Assessment & Plan Note (Signed)
.   Patient been placed on Accu-Cheks before every meal and nightly with sliding scale insulin . Holding home regimen of hypoglycemics . Hemoglobin A1C ordered . Diabetic Diet  

## 2022-07-02 DIAGNOSIS — S7292XK Unspecified fracture of left femur, subsequent encounter for closed fracture with nonunion: Secondary | ICD-10-CM

## 2022-07-02 DIAGNOSIS — G301 Alzheimer's disease with late onset: Secondary | ICD-10-CM | POA: Diagnosis not present

## 2022-07-02 DIAGNOSIS — G8918 Other acute postprocedural pain: Secondary | ICD-10-CM

## 2022-07-02 DIAGNOSIS — F02B0 Dementia in other diseases classified elsewhere, moderate, without behavioral disturbance, psychotic disturbance, mood disturbance, and anxiety: Secondary | ICD-10-CM

## 2022-07-02 DIAGNOSIS — M25552 Pain in left hip: Principal | ICD-10-CM

## 2022-07-02 DIAGNOSIS — Z515 Encounter for palliative care: Secondary | ICD-10-CM

## 2022-07-02 DIAGNOSIS — R4589 Other symptoms and signs involving emotional state: Secondary | ICD-10-CM

## 2022-07-02 DIAGNOSIS — Z7189 Other specified counseling: Secondary | ICD-10-CM

## 2022-07-02 DIAGNOSIS — G20A2 Parkinson's disease without dyskinesia, with fluctuations: Secondary | ICD-10-CM

## 2022-07-02 LAB — BASIC METABOLIC PANEL
Anion gap: 10 (ref 5–15)
BUN: 29 mg/dL — ABNORMAL HIGH (ref 8–23)
CO2: 23 mmol/L (ref 22–32)
Calcium: 8.7 mg/dL — ABNORMAL LOW (ref 8.9–10.3)
Chloride: 105 mmol/L (ref 98–111)
Creatinine, Ser: 1.12 mg/dL (ref 0.61–1.24)
GFR, Estimated: >60 mL/min (ref >60)
Glucose, Bld: 223 mg/dL — ABNORMAL HIGH (ref 70–99)
Potassium: 4.5 mmol/L (ref 3.5–5.1)
Sodium: 138 mmol/L (ref 135–145)

## 2022-07-02 LAB — GLUCOSE, CAPILLARY
Glucose-Capillary: 190 mg/dL — ABNORMAL HIGH (ref 70–99)
Glucose-Capillary: 158 mg/dL — ABNORMAL HIGH (ref 70–99)
Glucose-Capillary: 140 mg/dL — ABNORMAL HIGH (ref 70–99)
Glucose-Capillary: 135 mg/dL — ABNORMAL HIGH (ref 70–99)

## 2022-07-02 LAB — CBC
HCT: 34.6 % — ABNORMAL LOW (ref 39.0–52.0)
Hemoglobin: 11 g/dL — ABNORMAL LOW (ref 13.0–17.0)
MCH: 30.7 pg (ref 26.0–34.0)
MCHC: 31.8 g/dL (ref 30.0–36.0)
MCV: 96.6 fL (ref 80.0–100.0)
Platelets: 249 10*3/uL (ref 150–400)
RBC: 3.58 MIL/uL — ABNORMAL LOW (ref 4.22–5.81)
RDW: 13.2 % (ref 11.5–15.5)
WBC: 10.8 10*3/uL — ABNORMAL HIGH (ref 4.0–10.5)
nRBC: 0 % (ref 0.0–0.2)

## 2022-07-02 LAB — MAGNESIUM: Magnesium: 2 mg/dL (ref 1.7–2.4)

## 2022-07-02 MED ORDER — CEFAZOLIN SODIUM-DEXTROSE 2-4 GM/100ML-% IV SOLN
2.0000 g | Freq: Three times a day (TID) | INTRAVENOUS | Status: AC
  Administered 2022-07-02 – 2022-07-04 (×6): 2 g via INTRAVENOUS
  Filled 2022-07-02 (×7): qty 100

## 2022-07-02 MED ORDER — TRAMADOL HCL 50 MG PO TABS: 50.0000 mg | ORAL_TABLET | Freq: Four times a day (QID) | ORAL | Status: DC | PRN

## 2022-07-02 MED ORDER — SUGAMMADEX SODIUM 200 MG/2ML IV SOLN
INTRAVENOUS | Status: DC | PRN
  Administered 2022-07-01: 200 mg via INTRAVENOUS

## 2022-07-02 NOTE — Evaluation (Signed)
Physical Therapy Evaluation Patient Details Name: Joe Murray MRN: [id number] DOB: 1940/08/11 Today's Date: 07/02/2022  History of Present Illness  82 y.o. male with medical history significant of Alzheimer's dementia, CAD, status post percutaneous coronary angioplasty, persistent sinus bradycardia, history of SVT, type 2 diabetes, diverticulosis, hyperlipidemia, ED, GERD, internal and external hemorrhoids, insomnia, nodular basal cell carcinoma, Parkinson's disease, psoriasis, vertigo who underwent left hip fracture repair on 05/27/2022, subsequently discharged to [facility] for rehab who was brought to the emergency department due to worsening L hip pain. Imaging showed non union of fx. s/p conversion to L hip bipolar hemiarthroplasty 07/01/22.  Clinical Impression  Pt admitted with above diagnosis. +2 total assist (pt 10%) for supine to sit and for sit to stand using Stedy lift equipment. Pt initially lethargic but was more alert once in sitting. Undressed wound noted medial heel, RN notified and dressing applied.  Pt currently with functional limitations due to the deficits listed below (see PT Problem List). Pt will benefit from acute skilled PT to increase their independence and safety with mobility to allow discharge.          Recommendations for follow up therapy are one component of a multi-disciplinary discharge planning process, led by the attending physician.  Recommendations may be updated based on patient status, additional functional criteria and insurance authorization.  Follow Up Recommendations Can patient physically be transported by private vehicle: No     Assistance Recommended at Discharge Frequent or constant Supervision/Assistance  Patient can return home with the following  Two people to help with walking and/or transfers;Two people to help with bathing/dressing/bathroom;Assist for transportation;Assistance with feeding;Help with stairs or ramp for entrance;Direct  supervision/assist for financial management;Direct supervision/assist for medications management;Assistance with cooking/housework    Equipment Recommendations None recommended by PT  Recommendations for Other Services       Functional Status Assessment Patient has had a recent decline in their functional status and demonstrates the ability to make significant improvements in function in a reasonable and predictable amount of time.     Precautions / Restrictions Precautions Precautions: Posterior Hip Precaution Booklet Issued: Yes (comment) Precaution Comments: sign hung on door and on white board, reviewed precautions with pt's wife Restrictions Weight Bearing Restrictions: No LLE Weight Bearing: Weight bearing as tolerated      Mobility  Bed Mobility Overal bed mobility: Needs Assistance Bed Mobility: Supine to Sit     Supine to sit: Total assist, +2 for physical assistance     General bed mobility comments: assist to raise trunk and pivot hips to EOB (pt 10%)    Transfers Overall transfer level: Needs assistance   Transfers: Sit to/from Stand, Bed to chair/wheelchair/BSC Sit to Stand: From elevated surface, +2 physical assistance, Total assist           General transfer comment: assist to power up (pt 10%), pt unable to maintain standing position for more than ~3 seconds, sat on flaps of Stedy for pivot to recliner Transfer via Lift Equipment: Stedy  Ambulation/Gait                  Stairs            Wheelchair Mobility    Modified Rankin (Stroke Patients Only)       Balance Overall balance assessment: Needs assistance Sitting-balance support: Feet supported, No upper extremity supported Sitting balance-Leahy Scale: Fair Sitting balance - Comments: initially required min A then able to maintain trunk upright without assist   Standing  balance support: Bilateral upper extremity supported Standing balance-Leahy Scale: Zero                                Pertinent Vitals/Pain Pain Assessment Breathing: occasional labored breathing, short period of hyperventilation Negative Vocalization: occasional moan/groan, low speech, negative/disapproving quality Facial Expression: sad, frightened, frown Body Language: tense, distressed pacing, fidgeting Consolability: distracted or reassured by voice/touch PAINAD Score: 5    Home Living Family/patient expects to be discharged to:: Skilled nursing facility                        Prior Function Prior Level of Function : Needs assist  Cognitive Assist : Mobility (cognitive);ADLs (cognitive)     Physical Assist : ADLs (physical);Mobility (physical)     Mobility Comments: admitted from SNF where he did a little bit of walking in parallel bars; prior to hip fx 05/25/22 he walked with a RW ADLs Comments: assist needed     Hand Dominance        Extremity/Trunk Assessment   Upper Extremity Assessment Upper Extremity Assessment: Defer to OT evaluation    Lower Extremity Assessment Lower Extremity Assessment: RLE deficits/detail;LLE deficits/detail;Difficult to assess due to impaired cognition RLE Deficits / Details: does not follow commands LLE Deficits / Details: tolerated gentle PROM L hip flexion and abduction; does not follow commands       Communication   Communication: Expressive difficulties (slurred speech)  Cognition Arousal/Alertness: Lethargic Behavior During Therapy: Flat affect Overall Cognitive Status: History of cognitive impairments - at baseline Area of Impairment: Attention, Memory, Safety/judgement, Awareness, Orientation                 Orientation Level: Place, Time, Situation, Disoriented to   Memory: Decreased short-term memory, Decreased recall of precautions   Safety/Judgement: Decreased awareness of safety, Decreased awareness of deficits     General Comments: initially lethargic with eyes closed in supine, more  alert with eyes open once in sitting        General Comments      Exercises Total Joint Exercises Heel Slides: PROM, Left, 10 reps, Supine Hip ABduction/ADduction: PROM, Left, 10 reps, Supine   Assessment/Plan    PT Assessment Patient needs continued PT services  PT Problem List Decreased strength;Decreased balance;Pain;Decreased activity tolerance;Decreased safety awareness;Decreased mobility;Decreased skin integrity;Decreased cognition       PT Treatment Interventions Functional mobility training;Therapeutic activities;Therapeutic exercise;Balance training;Patient/family education    PT Goals (Current goals can be found in the Care Plan section)  Acute Rehab PT Goals Patient Stated Goal: to to [facility] for rehab PT Goal Formulation: With family Time For Goal Achievement: 07/16/22 Potential to Achieve Goals: Fair    Frequency Min 1X/week     Co-evaluation               AM-PAC PT "6 Clicks" Mobility  Outcome Measure Help needed turning from your back to your side while in a flat bed without using bedrails?: Total Help needed moving from lying on your back to sitting on the side of a flat bed without using bedrails?: Total Help needed moving to and from a bed to a chair (including a wheelchair)?: Total Help needed standing up from a chair using your arms (e.g., wheelchair or bedside chair)?: Total Help needed to walk in hospital room?: Total Help needed climbing 3-5 steps with a railing? : Total 6 Click Score: 6  End of Session   Activity Tolerance: Patient tolerated treatment well Patient left: in chair;with chair alarm set;with call bell/phone within reach;with family/visitor present Nurse Communication: Mobility status;Need for lift equipment;Other (comment) (maxi sky sling under pt in recliner; undressed wound noted medial L heel) PT Visit Diagnosis: Difficulty in walking, not elsewhere classified (R26.2);Pain;Muscle weakness (generalized)  (M62.81);Unsteadiness on feet (R26.81);Other abnormalities of gait and mobility (R26.89) Pain - Right/Left: Left Pain - part of body: Hip    Time: 1122-1211 PT Time Calculation (min) (ACUTE ONLY): 49 min   Charges:   PT Evaluation $PT Eval Moderate Complexity: 1 Mod PT Treatments $Therapeutic Activity: 23-37 mins        Ralene Bathe Kistler PT 07/02/2022  Acute Rehabilitation Services  Office [phone]

## 2022-07-02 NOTE — Progress Notes (Signed)
PROGRESS NOTE    Joe Murray  OZH:[id number] DOB: 1940-06-30 DOA: 06/29/2022 PCP: Nelwyn Salisbury, MD    Brief Narrative:   Joe Murray is a 82 y.o. male with past medical history significant for dementia, Parkinson's disease, CKD stage IIIa, non-insulin-dependent DM 2, HTN, CAD (PCI 02/2015 with DES to RCA and mid-LAD), GERD and recent hospitalization for left femoral neck fracture status post percutaneous pinning by Dr. Blanchie Dessert 05/27/2022 who presented to [facility] ED on 5/6 with left hip pain and inability to ambulate.  Following his recent surgery, patient was discharged to [facility] for rehabilitation.  Of note, family reports declining cognition since April.  Patient unable to provide any further history due to his dementia.  In the ED, temperature 98.9 F, HR 65, RR 26, BP 139/67, SpO2 96% on room air.  WBC 16.2, hemoglobin 13.0, platelets 270.  Sodium 138, potassium 4.3, chloride 104, CO2 25, glucose 25, BUN 28, creatinine 1.26.  AST 35, ALT 16, total bilirubin 0.7.  Imaging notable for shortening of the left femoral neck with impaction.  Orthopedics was consulted and TRH consulted for admission for further evaluation and management.  Assessment & Plan:   Left femoral neck fracture Patient with increased left hip pain over the previous 4 days prior to admission with inability to ambulate.  Underwent recent percutaneous pinning by Dr. Blanchie Dessert on 4/3 for left femoral neck fracture.  Imaging notable for left femoral head fracture with nonunion.  Patient's family initially apprehensive about undergoing repair but after discussions with orthopedics they were agreeable to proceed and patient underwent removal of cannulated screws and conversion to left hip bipolar hemiarthroplasty by Dr. Blanchie Dessert on 07/01/2022. -- Orthopedics following, appreciate assistance -- Continue antibiotics with Ancef x 3 days, transition to cefadroxil 500mg  BID x 7 days per orthoipedics --  WBAT with posterior hip precautions x 6 weeks -- Lovenox for postoperative DVT prophylaxis x 4 weeks per orthopedics -- Tylenol 1 g p.o. every 6 hours scheduled -- Toradol 7.5 mg IV every 6 hours scheduled x 4 doses -- Oxycodone 5-10 mg every 4 hours as needed moderate pain -- Dilaudid 0.5-1 mg IV every 4 hours as needed severe pain not relieved with oral medication -- Robaxin 500 mg p.o./IV every 6 hours.  Muscle spasms -- PT/OT evaluation pending -- Follow-up orthopedics 2 weeks for wound check  Essential hypertension CAD Currently not on antihypertensives outpatient. -- Hydralazine 5 mg IV q6h PRN SBP >165  Type 2 diabetes mellitus Hemoglobin A1c 7.2.  Home medications include glipizide 5 mg p.o. twice daily. -- Hold oral hypoglycemics while inpatient -- SSI for coverage -- CBGs qAC/HS  Parkinson disease/dementia -- Namenda 10 mg p.o. twice daily -- Sinemet 3 times daily -- Seroquel 75 mg p.o. nightly -- Melatonin 3 mg p.o. nightly  Goals of care Patient with significant decline since initial hip fracture in April 2024 now with recurrent hospitalization for nonunion with additional repair in the setting of advanced dementia.  Palliative care consulted for assistance with goals of care/medical decision making.  Overall long-term prognosis remains poor given his comorbidities, advanced age.    DVT prophylaxis: enoxaparin (LOVENOX) injection 40 mg Start: 07/02/22 0800 SCDs Start: 07/01/22 1935 Place TED hose Start: 07/01/22 1935 SCDs Start: 06/29/22 1210    Code Status: DNR Family Communication: Updated spouse present at bedside this morning  Disposition Plan:  Level of care: Med-Surg Status is: Inpatient Remains inpatient appropriate because: Pending PT/OT evaluation and anticipate likely  return to SNF.    Consultants:  Orthopedics, Dr. Blanchie Dessert Palliative care  Procedures:  removal of cannulated screws and conversion to left hip bipolar hemiarthroplasty by Dr.  Blanchie Dessert on 07/01/2022  Antimicrobials:  Cefazolin 5/8>>   Subjective: Seen and examined at bedside, sleeping/somnolent.  Spouse present at bedside.  Was able to eat some dinner with the assistance of his daughter last night. Unable to obtain any further ROS from patient due to his current mental status.  Wife without any other questions at this time.  No acute concerns overnight per nursing staff.  Discussed with palliative medicine, Dr. Patterson Hammersmith this am; who will be seeing the patient later today.  Objective: Vitals:   07/01/22 2148 07/02/22 0126 07/02/22 0527 07/02/22 1014  BP: 137/60 124/76 (!) 115/46 (!) 112/49  Pulse: 77   (!) 51  Resp:  15 14 17   Temp: 98.6 F (37 C) 98.6 F (37 C) 98 F (36.7 C) 97.6 F (36.4 C)  TempSrc:    Oral  SpO2: (!) 87% 96% 96% 98%  Weight:      Height:        Intake/Output Summary (Last 24 hours) at 07/02/2022 1213 Last data filed at 07/02/2022 0600 Gross per 24 hour  Intake 3614.59 ml  Output 800 ml  Net 2814.59 ml   Filed Weights   06/29/22 0855 06/30/22 1134  Weight: 98.5 kg 85.9 kg    Examination:  Physical Exam: GEN: NAD, somnolent, elderly/chronically ill in appearance HEENT: NCAT, PERRL, EOMI, sclera clear, MMM PULM: CTAB w/o wheezes/crackles, normal respiratory effort, on room air CV: RRR w/o M/G/R GI: abd soft, NTND, NABS, no R/G/M MSK: no peripheral edema    Data Reviewed: I have personally reviewed following labs and imaging studies  CBC: Recent Labs  Lab 06/29/22 1004 07/01/22 0358 07/02/22 0355  WBC 16.2* 13.6* 10.8*  NEUTROABS 13.2* 10.5*  --   HGB 13.0 11.6* 11.0*  HCT 40.8 36.8* 34.6*  MCV 96.0 99.2 96.6  PLT 270 262 249   Basic Metabolic Panel: Recent Labs  Lab 06/29/22 1004 07/01/22 0358 07/02/22 0355  NA 138 142 138  K 4.3 3.9 4.5  CL 104 109 105  CO2 25 25 23   GLUCOSE 185* 128* 223*  BUN 28* 24* 29*  CREATININE 1.26* 1.13 1.12  CALCIUM 8.9 9.1 8.7*  MG 2.1 2.0 2.0   GFR: Estimated  Creatinine Clearance: 54.2 mL/min (by C-G formula based on SCr of 1.12 mg/dL). Liver Function Tests: Recent Labs  Lab 06/29/22 1004 07/01/22 0358  AST 35 39  ALT 16 7  ALKPHOS 92 106  BILITOT 0.7 0.8  PROT 7.0 6.4*  ALBUMIN 3.2* 2.5*   No results for input(s): "LIPASE", "AMYLASE" in the last 168 hours. Recent Labs  Lab 06/30/22 1033  AMMONIA 23   Coagulation Profile: Recent Labs  Lab 07/01/22 0358  INR 1.1   Cardiac Enzymes: No results for input(s): "CKTOTAL", "CKMB", "CKMBINDEX", "TROPONINI" in the last 168 hours. BNP (last 3 results) No results for input(s): "PROBNP" in the last 8760 hours. HbA1C: No results for input(s): "HGBA1C" in the last 72 hours.  CBG: Recent Labs  Lab 07/01/22 1418 07/01/22 1717 07/01/22 2127 07/02/22 0751 07/02/22 1124  GLUCAP 148* 209* 211* 190* 158*   Lipid Profile: No results for input(s): "CHOL", "HDL", "LDLCALC", "TRIG", "CHOLHDL", "LDLDIRECT" in the last 72 hours. Thyroid Function Tests: Recent Labs    06/30/22 1037  TSH 1.310   Anemia Panel: Recent Labs    06/30/22  1037  VITAMINB12 1,034*  FOLATE 4.3*   Sepsis Labs: No results for input(s): "PROCALCITON", "LATICACIDVEN" in the last 168 hours.  Recent Results (from the past 240 hour(s))  Surgical PCR screen     Status: None   Collection Time: 06/30/22  9:46 PM   Specimen: Nasal Mucosa; Nasal Swab  Result Value Ref Range Status   MRSA, PCR NEGATIVE NEGATIVE Final   Staphylococcus aureus NEGATIVE NEGATIVE Final    Comment: (NOTE) The Xpert SA Assay (FDA approved for NASAL specimens in patients 61 years of age and older), is one component of a comprehensive surveillance program. It is not intended to diagnose infection nor to guide or monitor treatment. Performed at [facility], [address], [place], [place] [address]          Radiology Studies: DG HIP UNILAT W OR W/O PELVIS 2-3 VIEWS LEFT  Result Date: 07/01/2022 CLINICAL DATA:   Postop left hip. EXAM: DG HIP (WITH OR WITHOUT PELVIS) 2-3V LEFT COMPARISON:  Radiograph 06/29/2022 FINDINGS: Left hip arthroplasty in expected alignment. No periprosthetic lucency or fracture. Recent postsurgical change includes air and edema in the soft tissues. IMPRESSION: Left hip arthroplasty without immediate postoperative complication. Electronically Signed   By: Narda Rutherford M.D.   On: 07/01/2022 15:19   EEG adult  Result Date: 06/30/2022 Charlsie Quest, MD     06/30/2022  5:20 PM Patient Name: DAMORION MISIASZEK MRN: [id number] Epilepsy Attending: Charlsie Quest Referring Physician/Provider: Marinda Elk, MD Date:  06/30/2022 Duration: 25.54 mins Patient history: 82yo M with ams getting eeg to evaluate for seizure Level of alertness: Awake AEDs during EEG study: None Technical aspects: This EEG study was done with scalp electrodes positioned according to the 10-20 International system of electrode placement. Electrical activity was reviewed with band pass filter of 1-70Hz , sensitivity of 7 uV/mm, display speed of 96mm/sec with a 60Hz  notched filter applied as appropriate. EEG data were recorded continuously and digitally stored.  Video monitoring was available and reviewed as appropriate. Description: EEG showed continuous generalized 3 to 6 Hz theta-delta slowing.  Hyperventilation and photic stimulation were not performed.   ABNORMALITY - Continuous slow, generalized IMPRESSION: This study is suggestive of moderate diffuse encephalopathy, nonspecific etiology. No seizures or epileptiform discharges were seen throughout the recording. Priyanka Annabelle Harman   CT HEAD WO CONTRAST ( )  Result Date: 06/30/2022 CLINICAL DATA:  Delirium. EXAM: CT HEAD WITHOUT CONTRAST TECHNIQUE: Contiguous axial images were obtained from the base of the skull through the vertex without intravenous contrast. RADIATION DOSE REDUCTION: This exam was performed according to the departmental dose-optimization program which  includes automated exposure control, adjustment of the mA and/or kV according to patient size and/or use of iterative reconstruction technique. COMPARISON:  Head CT May 25, 2022. FINDINGS: Brain: No evidence of acute infarction, hemorrhage, hydrocephalus, extra-axial collection or mass lesion/mass effect. Patchy hypodensity of the periventricular white matter, nonspecific, most likely related to chronic microangiopathy. Moderate parenchymal volume loss. Vascular: No hyperdense vessel. Calcified plaques in the bilateral carotid siphons. Skull: Normal. Negative for fracture or focal lesion. Sinuses/Orbits: No acute finding.1 Other: None. IMPRESSION: 1. No acute intracranial abnormality. 2. Moderate parenchymal volume loss and chronic microangiopathy of the white matter. Electronically Signed   By: Baldemar Lenis M.D.   On: 06/30/2022 15:17        Scheduled Meds:  acetaminophen  1,000 mg Oral Q6H   acetaminophen  650 mg Oral BID   carbidopa-levodopa  1  tablet Oral TID   cyanocobalamin  1,000 mcg Oral Daily   docusate sodium  100 mg Oral BID   enoxaparin (LOVENOX) injection  40 mg Subcutaneous Q24H   feeding supplement  237 mL Oral BID BM   folic acid  1 mg Intravenous Daily   insulin aspart  0-9 Units Subcutaneous TID WC   ketorolac  7.5 mg Intravenous Q6H   magnesium chloride  256 mg Oral Daily   melatonin  3 mg Oral QHS   memantine  10 mg Oral BID   multivitamin with minerals  1 tablet Oral Daily   pantoprazole  40 mg Oral Daily   prednisoLONE acetate  1 drop Right Eye Daily   QUEtiapine  75 mg Oral QHS   senna  1 tablet Oral BID   Continuous Infusions:  sodium chloride 50 mL/hr at 06/29/22 1701    ceFAZolin (ANCEF) IV     lactated ringers 75 mL/hr at 07/02/22 0551   methocarbamol (ROBAXIN) IV       LOS: 3 days    Time spent: 51 minutes spent on chart review, discussion with nursing staff, consultants, updating family and interview/physical exam; more than 50% of  that time was spent in counseling and/or coordination of care.    Alvira Philips [place], DO [facility] Available via Epic secure chat 7am-7pm After these hours, please refer to coverage provider listed on [email or web address] 07/02/2022, 12:13 PM

## 2022-07-02 NOTE — Progress Notes (Signed)
OT Cancellation Note  Patient Details Name: Joe Murray MRN: [id number] DOB: 01-Feb-1941   Cancelled Treatment:    Reason Eval/Treat Not Completed: Other (comment). Patient is currently working with speech therapy.   Reuben Likes, OTR/L 07/02/2022, 5:44 PM

## 2022-07-02 NOTE — Addendum Note (Signed)
Addendum  created 07/02/22 1443 by Florene Route, CRNA   Intraprocedure Meds edited

## 2022-07-02 NOTE — Progress Notes (Signed)
     Subjective:  Patient lying comfortably in bed and more of a neutral supine position than he was able to do before surgery.  Appears more comfortable with manipulation of his lower extremities.  Rest with his eyes closed does not answer questions or follow commands.  Hemoglobin 11.0 this a.m.  Objective:   VITALS:   Vitals:   07/01/22 2131 07/01/22 2148 07/02/22 0126 07/02/22 0527  BP: 137/60 137/60 124/76 (!) 115/46  Pulse: 77 77    Resp: 15  15 14   Temp: 98.6 F (37 C) 98.6 F (37 C) 98.6 F (37 C) 98 F (36.7 C)  TempSrc:      SpO2: 96% (!) 87% 96% 96%  Weight:      Height:        Intact pulses distally Incision: dressing C/D/I Compartment soft Patient does not cooperative motor or sensory exam due to baseline dementia  Lab Results  Component Value Date   WBC 10.8 (H) 07/02/2022   HGB 11.0 (L) 07/02/2022   HCT 34.6 (L) 07/02/2022   MCV 96.6 07/02/2022   PLT 249 07/02/2022   BMET    Component Value Date/Time   NA 138 07/02/2022 0355   NA 142 06/01/2019 0824   K 4.5 07/02/2022 0355   CL 105 07/02/2022 0355   CO2 23 07/02/2022 0355   GLUCOSE 223 (H) 07/02/2022 0355   BUN 29 (H) 07/02/2022 0355   BUN 22 06/01/2019 0824   CREATININE 1.12 07/02/2022 0355   CREATININE 1.26 (H) 11/07/2019 1048   CALCIUM 8.7 (L) 07/02/2022 0355   GFRNONAA >60 07/02/2022 0355      Xray: PACU x-rays demonstrated cemented hip and knee arthroplasty without adverse features  Assessment/Plan: 1 Day Post-Op   Principal Problem:   Fracture of left femur with nonunion Active Problems:   GERD   Type 2 diabetes mellitus with stage 3a chronic kidney disease, without long-term current use of insulin (HCC)   Coronary artery disease involving native coronary artery of native heart without angina pectoris   Parkinson's disease   Alzheimer's dementia (HCC)   Chronic kidney disease, stage 3a (HCC)   Unstageable pressure ulcer of left heel (HCC)   Mixed diabetic hyperlipidemia  associated with type 2 diabetes mellitus (HCC)   Pressure injury of left ankle, stage 2 (HCC)   Goals of care, counseling/discussion   Folate deficiency  S/p L hip cannulated screw removal conversion to hip hemiarthroplasty   Post op recs: WB: WBAT with posterior hip precautions x6 weeks Abx: Continue Ancef x 3 days, discharge cefadroxil 500 twice daily x7 days given increased risk of infection due to multiple recent surgeries Imaging: PACU xrays Dressing: Aquacel dressing to be kept intact until follow-up DVT prophylaxis: lovenox starting POD1 x4 weeks Follow up: 2 weeks after surgery for a wound check with Dr. Blanchie Dessert at [facility].  Address: [address], [place], [place] [address]  Office Phone: [phone]    Joen Laura 07/02/2022, 6:38 AM   Weber Cooks, MD  Contact information:   [phone] 7am-5pm epic message Dr. Blanchie Dessert, or call office for patient follow up: [phone] After hours and holidays please check [email or web address] for group call information for [facility]

## 2022-07-02 NOTE — Plan of Care (Signed)
  Problem: Nutritional: Goal: Maintenance of adequate nutrition will improve Outcome: Progressing   Problem: Skin Integrity: Goal: Risk for impaired skin integrity will decrease Outcome: Progressing   Problem: Pain Managment: Goal: General experience of comfort will improve Outcome: Progressing   

## 2022-07-02 NOTE — Consult Note (Addendum)
Consultation Note Date: 07/02/2022   Patient Name: Joe Murray  DOB: [birth year]  MRN: [id number]  Age / Sex: 82 y.o., male   PCP: Nelwyn Salisbury, MD Referring Physician: [place], Eric J, DO  Reason for Consultation: Establishing goals of care     Chief Complaint/History of Present Illness:   Patient is an 82 year old male with a past medical history of dementia, Parkinson's disease, CKD stage IIIa, diabetes mellitus type 2, hypertension, CAD, and GERD who was admitted on 06/29/2022 for management of left femoral neck fracture status post pinning during recent hospitalization on 05/27/22.  Patient has been discharged to [facility] for rehab after this though family notes medical status has continued to deteriorate.  Orthopedics was consulted upon admission and was recommended patient undergo a left hip arthroplasty due to imaging showing left femoral head fracture with nonunion.  Patient underwent surgery on 07/01/2022.  Palliative medicine team consulted to assist with complex medical decision making.  Had attempted to see patient for initial consult yesterday though patient was at OR for surgery.  Reviewed EMR prior to presenting to bedside today.  When presenting to bedside, patient seen sitting up in bedside chair with legs elevated.  Patient's wife present at bedside as well.  Introduced myself and the role of the palliative medicine team in patient's care. Palliative medicine is specialized medical care for people living with serious illness. It focuses on providing relief from the symptoms and stress of a serious illness. The goal is to improve quality of life for both the patient and the family.  Patient pleasantly confused and attempting to communicate at times though can be nonsensical.  Able to discuss care with patient's wife.  She was able to update me regarding patient's medical journey and recent deterioration since his fall in April.  Wife notes that patient's energy has been  deteriorating with his underlying Parkinson's disease.  Wife noted after discussion with providers, wanted to proceed with surgery for pain relief.  Wife's primary goal for patient is that he be as comfortable as he can for the remainder of his life.  Acknowledges and empathized with difficult situation.  Able to discuss pathways for medical care moving forward.  Wife noted that family has looked to assistance for finding best support for patient.  Her goal is for quality time for patient.  She noted patient came from rehab facility.  Wife and family are discussing how they want to proceed with patient's care moving forward and if they want him to go back to a facility for rehab or not.  Able to discuss that no matter with rehab or not, patient needs long-term care as wife cannot care for him at home.  Wife agreeing with this.  She noted she and her family are looking into long-term care for patient.  Also able to discuss ways to support patient's symptom management moving forward.  If family decides on proceeding with rehab, would consider palliative medicine referral to assist with symptom management moving forward and to continue goals of care discussions.  If family opts not to pursue rehab, may need to then consider hospice.  Also noted that if patient goes to rehab and does not do well, may need to transition to hospice. Wife voiced lots of concern over financial and insurance coverage issues.  Acknowledged this and encouraged further discussions with TOC and her family and their assistance outside of the hospital.  All questions answered as able.  Thanked them for allowing me to  meet with them today.  Primary Diagnoses  Present on Admission:  Alzheimer's dementia (HCC)  Chronic kidney disease, stage 3a (HCC)  Type 2 diabetes mellitus with stage 3a chronic kidney disease, without long-term current use of insulin (HCC)  GERD  Parkinson's disease  Fracture of left femur with nonunion   Unstageable pressure ulcer of left heel (HCC)  Coronary artery disease involving native coronary artery of native heart without angina pectoris  Mixed diabetic hyperlipidemia associated with type 2 diabetes mellitus (HCC)  Pressure injury of left ankle, stage 2 (HCC)   Palliative Review of Systems: Appears comfortable   Past Medical History:  Diagnosis Date   Alzheimer's dementia [facility])    sees Dr. Patrcia Dolly   CAD S/P percutaneous coronary angioplasty 10/'03; 3/'09; 7/*09; 1/'17    a. Dr. Herbie [place]; '03 - Cypher DES 3.0 mm 33 mm proximal-mid LAD (details D1 with ostial 60%); 3/'09 dRCA 2.75 mm x 13 mm Cypher DES (2.8 mm); 7/'09 pRCA 3.0 mm x 18 m Cypher DES (3.25 mm);; (2009) 2D Echo - EF 55%, (November 2014) nonischemic Myoview;; b. Promus DES to RCA and mid LAD 02/26/2015   Chronotropic incompetence 12/2011   ETT 11/2017: Normal blood pressure response.  No EKG changes.  Exercise stopped due to fatigue and dyspnea.  Heart rate increased to low 90s from 50s.  Suggest chronotropic incompetence.  Severely impaired exercise capacity. -->  Results reviewed with electrophysiology: Still not indication for pacemaker.   Diabetes mellitus type 2 in obese    CAD   Diverticulosis    Dyslipidemia, goal LDL below 70    on [employer] study medicaion.   ED (erectile dysfunction)    GERD (gastroesophageal reflux disease)    Hemorrhoids 07/2002   Internal and External   Insomnia    Nodular basal cell carcinoma (BCC) 05/08/2019   left ear (cx27fu)   Non-STEMI (non-ST elevated myocardial infarction) (HCC) 11/2001   a. Proximal LAD tandem lesions -- long DES stent covering both;; b. Jan 2017: mRCA PCI, mLAD PCI   Parkinson's disease    sees Dr. Patrcia Dolly   Persistent sinus bradycardia    Personal history of colonic adenomas 09/06/2012   Psoriasis    Resting HR in 54s   SCC (squamous cell carcinoma) 11/13/2001   right upper outer arm (txpbx)   SCC (squamous cell carcinoma) 11/13/2009   right  upper ear rim (cx3 [id number])   SCC (squamous cell carcinoma) 01/20/2010   right nose (cx39fu)   SCC (squamous cell carcinoma) 03/28/2014   right side of nose ( cx82fu)   SCC (squamous cell carcinoma) 06/03/2016   right side of nose   SCC (squamous cell carcinoma) 05/08/2019   right ear inferior (cx74fu) cis   SCCA (squamous cell carcinoma) of skin 04/23/2021   Right Temporal Scalp (well diff) (tx p bx)   Spasm of esophagus    Squamous cell carcinoma in situ (SCCIS) 05/08/2019   Right Ear Inferior   Squamous cell carcinoma of skin 11/08/2001   top right rim ear    SVT (supraventricular tachycardia) 2003   Vertigo, benign positional    Social History   Socioeconomic History   Marital status: Married    Spouse name: Not on file   Number of children: 4   Years of education: Not on file   Highest education level: Not on file  Occupational History   Occupation: retired  Tobacco Use   Smoking status: Every Day    Types: Cigars  Start date: 07/06/1952   Smokeless tobacco: Never   Tobacco comments:    1-2 per day; he says "I really don't inhale"  Vaping Use   Vaping Use: Never used  Substance and Sexual Activity   Alcohol use: Yes    Alcohol/week: 0.0 standard drinks of alcohol    Comment: occ   Drug use: No   Sexual activity: Never  Other Topics Concern   Not on file  Social History Narrative   He is a married, father of 3, grandfather 3.   He still smokes 2-3 cigars per day. States that he "does not really inhale ". He is not really all that it's including, stating that he wants to have his 1 remaining vice.   Exercises only on occasion.   He does various landscaping jobs including cutting wood, and clearing brush.      Pt is right handed   Lives in 2 story home with his wife   High school graduate   Retired Curator   Social Determinants of Health   Financial Resource Strain: Low Risk  (03/30/2022)   Overall Financial Resource Strain (CARDIA)    Difficulty of Paying  Living Expenses: Not hard at all  Recent Concern: Financial Resource Strain - Medium Risk (01/06/2022)   Overall Financial Resource Strain (CARDIA)    Difficulty of Paying Living Expenses: Somewhat hard  Food Insecurity: No Food Insecurity (06/30/2022)   Hunger Vital Sign    Worried About Running Out of Food in the Last Year: Never true    Ran Out of Food in the Last Year: Never true  Transportation Needs: No Transportation Needs (06/30/2022)   PRAPARE - Administrator, Civil Service (Medical): No    Lack of Transportation (Non-Medical): No  Physical Activity: Inactive (03/30/2022)   Exercise Vital Sign    Days of Exercise per Week: 0 days    Minutes of Exercise per Session: 0 min  Stress: No Stress Concern Present (03/30/2022)   [employer] of Occupational Health - Occupational Stress Questionnaire    Feeling of Stress : Not at all  Social Connections: Socially Integrated (03/30/2022)   Social Connection and Isolation Panel [NHANES]    Frequency of Communication with Friends and Family: More than three times a week    Frequency of Social Gatherings with Friends and Family: More than three times a week    Attends Religious Services: More than 4 times per year    Active Member of [employer] or Organizations: Yes    Attends Engineer, structural: More than 4 times per year    Marital Status: Married   Family History  Problem Relation Age of Onset   Diabetes Mother    Hearing loss Father    Dementia Father    Heart attack Father 78       first MI prior to age 7   Heart attack Brother    Scheduled Meds:  acetaminophen  1,000 mg Oral Q6H   acetaminophen  650 mg Oral BID   carbidopa-levodopa  1 tablet Oral TID   cyanocobalamin  1,000 mcg Oral Daily   docusate sodium  100 mg Oral BID   enoxaparin (LOVENOX) injection  40 mg Subcutaneous Q24H   feeding supplement  237 mL Oral BID BM   folic acid  1 mg Intravenous Daily   insulin aspart  0-9 Units Subcutaneous TID WC    ketorolac  7.5 mg Intravenous Q6H   magnesium chloride  256 mg Oral  Daily   melatonin  3 mg Oral QHS   memantine  10 mg Oral BID   multivitamin with minerals  1 tablet Oral Daily   pantoprazole  40 mg Oral Daily   prednisoLONE acetate  1 drop Right Eye Daily   QUEtiapine  75 mg Oral QHS   senna  1 tablet Oral BID   Continuous Infusions:  sodium chloride 50 mL/hr at 06/29/22 1701    ceFAZolin (ANCEF) IV     lactated ringers 75 mL/hr at 07/02/22 0551   methocarbamol (ROBAXIN) IV     PRN Meds:.acetaminophen, diphenhydrAMINE, hydrALAZINE, HYDROmorphone (DILAUDID) injection, menthol-cetylpyridinium **OR** phenol, methocarbamol **OR** methocarbamol (ROBAXIN) IV, nitroGLYCERIN, ondansetron **OR** ondansetron (ZOFRAN) IV, ondansetron **OR** ondansetron (ZOFRAN) IV, polyethylene glycol, traMADol Allergies  Allergen Reactions   Beta Adrenergic Blockers Other (See Comments)    chronotropic incompetence   Praluent [Alirocumab]     Diffuse Rash - improved when stopping & taking Benadryl   Sulfonamide Derivatives     unknown   Statins Other (See Comments)    REACTION: myalgias   CBC:    Component Value Date/Time   WBC 10.8 (H) 07/02/2022 0355   HGB 11.0 (L) 07/02/2022 0355   HCT 34.6 (L) 07/02/2022 0355   PLT 249 07/02/2022 0355   MCV 96.6 07/02/2022 0355   NEUTROABS 10.5 (H) 07/01/2022 0358   LYMPHSABS 1.8 07/01/2022 0358   MONOABS 0.9 07/01/2022 0358   EOSABS 0.2 07/01/2022 0358   BASOSABS 0.0 07/01/2022 0358   Comprehensive Metabolic Panel:    Component Value Date/Time   NA 138 07/02/2022 0355   NA 142 06/01/2019 0824   K 4.5 07/02/2022 0355   CL 105 07/02/2022 0355   CO2 23 07/02/2022 0355   BUN 29 (H) 07/02/2022 0355   BUN 22 06/01/2019 0824   CREATININE 1.12 07/02/2022 0355   CREATININE 1.26 (H) 11/07/2019 1048   GLUCOSE 223 (H) 07/02/2022 0355   CALCIUM 8.7 (L) 07/02/2022 0355   AST 39 07/01/2022 0358   ALT 7 07/01/2022 0358   ALKPHOS 106 07/01/2022 0358   BILITOT  0.8 07/01/2022 0358   BILITOT 0.3 06/01/2019 0824   PROT 6.4 (L) 07/01/2022 0358   PROT 6.6 06/01/2019 0824   ALBUMIN 2.5 (L) 07/01/2022 0358   ALBUMIN 4.4 06/01/2019 0824    Physical Exam: Vital Signs: BP (!) 115/46 (BP Location: Right Arm)   Pulse 77   Temp 98 F (36.7 C)   Resp 14   Ht 5\' 8"  (1.727 m)   Wt 85.9 kg   SpO2 96%   BMI 28.79 kg/m  SpO2: SpO2: 96 % O2 Device: O2 Device: Room Air O2 Flow Rate: O2 Flow Rate (L/min): 2 L/min Intake/output summary:  Intake/Output Summary (Last 24 hours) at 07/02/2022 0846 Last data filed at 07/02/2022 0600 Gross per 24 hour  Intake 3614.59 ml  Output 1200 ml  Net 2414.59 ml   LBM: Last BM Date : 07/01/22 Baseline Weight: Weight: 98.5 kg Most recent weight: Weight: 85.9 kg  General: NAD, confused, sitting in bedside chair, chronically ill appearing  Eyes: No drainage noted HENT:  moist mucous membranes Cardiovascular: RRR Respiratory: no increased work of breathing noted, not in respiratory distress Abdomen: not distended Skin: Wounds noted on skin from pressure, review EMR regarding this Neuro: Pleasantly confused          Palliative Performance Scale: 40%              Additional Data Reviewed: Recent Labs  07/01/22 0358 07/02/22 0355  WBC 13.6* 10.8*  HGB 11.6* 11.0*  PLT 262 249  NA 142 138  BUN 24* 29*  CREATININE 1.13 1.12    Imaging: DG HIP UNILAT W OR W/O PELVIS 2-3 VIEWS LEFT CLINICAL DATA:  Postop left hip.  EXAM: DG HIP (WITH OR WITHOUT PELVIS) 2-3V LEFT  COMPARISON:  Radiograph 06/29/2022  FINDINGS: Left hip arthroplasty in expected alignment. No periprosthetic lucency or fracture. Recent postsurgical change includes air and edema in the soft tissues.  IMPRESSION: Left hip arthroplasty without immediate postoperative complication.  Electronically Signed   By: Narda Rutherford M.D.   On: 07/01/2022 15:19    I personally reviewed recent imaging.   Palliative Care Assessment and  Plan Summary of Established Goals of Care and Medical Treatment Preferences   Patient is an 82 year old male with a past medical history of dementia, Parkinson's disease, CKD stage IIIa, diabetes mellitus type 2, hypertension, CAD, and GERD who was admitted on 06/29/2022 for management of left femoral neck fracture status post pinning during recent hospitalization on 05/27/22.  Patient has been discharged to [facility] for rehab after this though family notes medical status has continued to deteriorate.  Orthopedics was consulted upon admission and was recommended patient undergo a left hip arthroplasty due to imaging showing left femoral head fracture with nonunion.  Patient underwent surgery on 07/01/2022.  Palliative medicine team consulted to assist with complex medical decision making.  # Complex medical decision making/goals of care  -Patient unable to discuss complex medical decision making at this time due to underlying medical status.  -Conversation with wife as noted above in HPI.  Able to discuss possible pathways for medical care moving forward.  Wife noted she and children are considering rehab then long-term care versus proceeding immediately with long-term care.  Offered additional supports available depending on course including palliative medicine referral versus evaluation for hospice if patient continues to deteriorate.  Wife planning to speak with other family members regarding decisions for medical care moving forward.  -Patient has MOST form on file from 05/30/2022.  -  Code Status: DNR   # Symptom management  -Wife's primary goal is that patient be comfortable. Would consider keeping Tylenol 1000mg  scheduled q8hrs during the day. Do NOT recommend tramadol in this elderly individual with history of falls as it has multiple interactions in the elderly. Would instead consider low dose oxycodone 2.5mg  q6hrs prn if opioid is needed.   # Psycho-social/Spiritual Support:  - Support System:  wife, daughter  # Discharge Planning:  TBD  Thank you for allowing the palliative care team to participate in the care Leane Platt.  Alvester Morin, DO Palliative Care Provider PMT # [phone]  If patient remains symptomatic despite maximum doses, please call PMT at [phone] between 0700 and 1900. Outside of these hours, please call attending, as PMT does not have night coverage.  This provider spent a total of 80 minutes providing patient's care.  Includes review of EMR, discussing care with other staff members involved in patient's medical care, obtaining relevant history and information from patient and/or patient's family, and personal review of imaging and lab work. Greater than 50% of the time was spent counseling and coordinating care related to the above assessment and plan.    *Please note that this is a verbal dictation therefore any spelling or grammatical errors are due to the "Dragon Medical One" system interpretation.

## 2022-07-02 NOTE — TOC Progression Note (Signed)
Transition of Care [facility]) - Progression Note   Patient Details  Name: Joe Murray MRN: [id number] Date of Birth: Mar 29, 1940  [facility] CM/SW Contact  Ewing Schlein, LCSW Phone Number: 07/02/2022, 2:55 PM  Clinical Narrative: PT evaluation recommended SNF. CSW spoke with patients spouse, Dusan Leff, regarding recommendations as well as her meeting with palliative. Per wife, she wants to discuss rehab and placement options for the patient with her children prior to deciding what she would like to do in terms of placement (going to short term rehab versus going to long-term care with hospice). Wife reported she is agreeable to the patient returning to [facility] for rehab while the family continues to work with Sue Lush, someone who helps families navigate and set up services/placement for older adults. Wife reported she would like to follow up with the hospitalist before agreeing to rehab after she speaks with her children. TOC to follow up with wife after she discusses options with children.   Expected Discharge Plan: Skilled Nursing Facility Barriers to Discharge: Continued Medical Work up  Expected Discharge Plan and Services In-house Referral: Clinical Social Work Living arrangements for the past 2 months: Single Family Home  Social Determinants of Health (SDOH) Interventions SDOH Screenings   Food Insecurity: No Food Insecurity (06/30/2022)  Housing: Low Risk  (06/30/2022)  Transportation Needs: No Transportation Needs (06/30/2022)  Utilities: Not At Risk (06/30/2022)  Alcohol Screen: Low Risk  (03/30/2022)  Depression (PHQ2-9): Low Risk  (03/30/2022)  Financial Resource Strain: Low Risk  (03/30/2022)  Recent Concern: Financial Resource Strain - Medium Risk (01/06/2022)  Physical Activity: Inactive (03/30/2022)  Social Connections: Socially Integrated (03/30/2022)  Stress: No Stress Concern Present (03/30/2022)  Tobacco Use: High Risk (07/01/2022)   Readmission Risk  Interventions    06/01/2022   11:19 AM  Readmission Risk Prevention Plan  Post Dischage Appt Complete  Medication Screening Complete  Transportation Screening Complete

## 2022-07-02 NOTE — Evaluation (Signed)
Clinical/Bedside Swallow Evaluation Patient Details  Name: DEMETRIOS AMARANTE MRN: [id number] Date of Birth: Jun 16, 1940  Today's Date: 07/02/2022 Time: SLP Start Time (ACUTE ONLY): 1700 SLP Stop Time (ACUTE ONLY): 1730 SLP Time Calculation (min) (ACUTE ONLY): 30 min  Past Medical History:  Past Medical History:  Diagnosis Date   Alzheimer's dementia [facility])    sees Dr. Patrcia Dolly   CAD S/P percutaneous coronary angioplasty 10/'03; 3/'09; 7/*09; 1/'17    a. Dr. Herbie Baltimore; '03 - Cypher DES 3.0 mm 33 mm proximal-mid LAD (details D1 with ostial 60%); 3/'09 dRCA 2.75 mm x 13 mm Cypher DES (2.8 mm); 7/'09 pRCA 3.0 mm x 18 m Cypher DES (3.25 mm);; (2009) 2D Echo - EF 55%, (November 2014) nonischemic Myoview;; b. Promus DES to RCA and mid LAD 02/26/2015   Chronotropic incompetence 12/2011   ETT 11/2017: Normal blood pressure response.  No EKG changes.  Exercise stopped due to fatigue and dyspnea.  Heart rate increased to low 90s from 50s.  Suggest chronotropic incompetence.  Severely impaired exercise capacity. -->  Results reviewed with electrophysiology: Still not indication for pacemaker.   Diabetes mellitus type 2 in obese    CAD   Diverticulosis    Dyslipidemia, goal LDL below 70    on [employer] study medicaion.   ED (erectile dysfunction)    GERD (gastroesophageal reflux disease)    Hemorrhoids 07/2002   Internal and External   Insomnia    Nodular basal cell carcinoma (BCC) 05/08/2019   left ear (cx55fu)   Non-STEMI (non-ST elevated myocardial infarction) (HCC) 11/2001   a. Proximal LAD tandem lesions -- long DES stent covering both;; b. Jan 2017: mRCA PCI, mLAD PCI   Parkinson's disease    sees Dr. Patrcia Dolly   Persistent sinus bradycardia    Personal history of colonic adenomas 09/06/2012   Psoriasis    Resting HR in 54s   SCC (squamous cell carcinoma) 11/13/2001   right upper outer arm (txpbx)   SCC (squamous cell carcinoma) 11/13/2009   right upper ear rim (cx3 106fu)   SCC  (squamous cell carcinoma) 01/20/2010   right nose (cx86fu)   SCC (squamous cell carcinoma) 03/28/2014   right side of nose ( cx33fu)   SCC (squamous cell carcinoma) 06/03/2016   right side of nose   SCC (squamous cell carcinoma) 05/08/2019   right ear inferior (cx32fu) cis   SCCA (squamous cell carcinoma) of skin 04/23/2021   Right Temporal Scalp (well diff) (tx p bx)   Spasm of esophagus    Squamous cell carcinoma in situ (SCCIS) 05/08/2019   Right Ear Inferior   Squamous cell carcinoma of skin 11/08/2001   top right rim ear    SVT (supraventricular tachycardia) 2003   Vertigo, benign positional    Past Surgical History:  Past Surgical History:  Procedure Laterality Date   CARDIAC CATHETERIZATION N/A 02/26/2015   Procedure: Left Heart Cath and Coronary Angiography;  Surgeon: Marykay Lex, MD: mRCA 99% --> PCI. mLAD 75%-> FFR Guided PCI, Mod ISR in pLAD & pRCA   CARDIAC CATHETERIZATION N/A 02/26/2015   Procedure: Coronary Stent Intervention;  Surgeon: Marykay Lex, MD;  Location: [facility] INVASIVE CV LAB: mRCA Promus Premier DES 3.0 x 12 (3.5), mLAD Promus Premier DES 2.75 x 20 (3.0)   CARDIAC CATHETERIZATION N/A 02/26/2015   Procedure: Intravascular Pressure Wire/FFR Study;  Surgeon: Marykay Lex, MD;  Location: [facility] INVASIVE CV LAB;  Service: Cardiovascular: LAD 75% - FFR + -> PCI  CATARACT EXTRACTION  4540,9811   COLONOSCOPY  12/16/2017   per Dr. Leone Payor, adenomatous polyps, no repeats due to age    CORONARY ANGIOPLASTY WITH STENT PLACEMENT  LAD - 2003, RCA 3 & 7/ '09   Cypher 3.0 x 32 mid LAD; Cyper 2.75 x 13 - distal RCA, 3.0 x 18 Prox RCA   ESOPHAGOGASTRODUODENOSCOPY  08-11-02   esophageal dilation per Dr. Leone Payor   HERNIA REPAIR     right inguinal    INTRAMEDULLARY (IM) NAIL INTERTROCHANTERIC  05/27/2022   Procedure: Cannulated screw fixation of left valgus impacted femoral neck fracture;  Surgeon: Joen Laura, MD;  Location: [facility] ORS;  Service: Orthopedics;;   NM MYOVIEW  LTD  Nov 2014   ~8 METS; EF 60%, no ischemia or infarction   PERCUTANEOUS CORONARY STENT INTERVENTION (PCI-S)  11/23/2001   NSTEMI: Prox-Mid LAD tandem ~80% lesions on either side of D1 (with 80% lesion) -- Cypher DES 3.0 mm x 33 m (covering both lesions)    PERCUTANEOUS CORONARY STENT INTERVENTION (PCI-S)  04/27/2007   Unstable Angina: Distal RCA 95%: Cypher 2.75x13  (2.8 mm); residual focal ~60%ISR in LAD stent, 60% D1 ostial (no PTCA on LAD due to no ischemia on ST)   PERCUTANEOUS CORONARY STENT INTERVENTION (PCI-S)  09/21/2007   Bradycardia & Unstable Angina: Prox RCA 70% - PCI Cypher DES 3.0 mm x 18 mm  (3.25 mm); ostial 60-70% jailed D1. LAD & distal RCA stents patent   SHOULDER SURGERY     left rotator cuff, Dr. Darrelyn Hillock   TRANSTHORACIC ECHOCARDIOGRAM  March 2009   Normal LV size and function, EF 55%. Mild MR; mild RV dilation.   TRANSTHORACIC ECHOCARDIOGRAM  02/2015   EF 60-65%. Moderate concentric LVH. Normal function with normal regional wall motion. GR 1 DD   WRIST FRACTURE SURGERY     right   HPI:  Pt is an 82 yo male adm to [facility] with fx and prior pinning site from April 2024 hip fx - s/p surgery on 07/01/2022.  Per notes, pt has had progressive cognitive decline recently.  Swallow eval ordered but pt was not opening mouth. Pt has h/o dementia and Parkinsons disease.  Per family report to RD pt does better with soft foods. , UGI 2004 THE SWALLOWING FUNCTION IS NORMAL.  THERE IS NOTED TO BE A SLIDING-TYPE HIATAL HERNIA MEASURING 3-4  CM. NO DEFINITE GASTROESOPHAGEAL REFLUX IS DEMONSTRATED HOWEVER.  THERE IS NOTED TO BE MODERATE  SPASM OF THE ESOPHAGUS.  THERE IS PARTIAL OBSTRUCTION AT THE GASTROESOPHAGEAL JUNCTION TO THE  PASSAGE OF A 13 MM TABLET.  Wife and daughter present. Wife reports pt has been holding liquids in his mouth at times and has prolonged mastication but she denies him coughing with intake.  Reports weight loss since at the SNF but states the facility has "good food".     Assessment / Plan / Recommendation  Clinical Impression  Patient greeted sitting up in bed with family present.  He presented with functional swallow based on limited consumption consumed including 2 graham crackers approximately 1 6 of an Ensure mixed with chocolate ice cream, a few boluses of ginger ale and tea.  With patient self-feeding using finger foods and cup with straw, swallow appeared efficient and effective.  Frequent erectation noted after patient swallowed liquids concerning that this may lead to reflux as pt has history of reflux and suspected esophageal spasm per upper GI study in 2004. Wife reports patient underwent esophageal dilatation and was placed  on reflux medication temporarily with complete resolution of symptoms.  Wife endorses patient had some of some oral holding with liquids as well as prolonged mastication prior to admission these symptoms of dysphagia are consistent with diagnosis of dysphagia with dementia.  Educated wife and daughter to recommendations for patient to consume finger foods that he is able to feed himself to optimize safety with intake as well as efficiency of swallow.  Further discussed gustatory changes associated with dementia. Recommend continue diet with close monitoring for potential reflux.  At this time no SLP follow-up is indicated as all education is completed. SLP Visit Diagnosis: Dysphagia, unspecified (R13.10)    Aspiration Risk  Mild aspiration risk    Diet Recommendation Regular;Thin liquid   Liquid Administration via: Cup;Straw Medication Administration: Other (Comment) (as tolerated) Compensations: Slow rate;Small sips/bites;Other (Comment) (pt to feed self as able) Postural Changes: Remain upright for at least 30 minutes after po intake;Seated upright at 90 degrees    Other  Recommendations Oral Care Recommendations: Oral care BID    Recommendations for follow up therapy are one component of a multi-disciplinary discharge planning  process, led by the attending physician.  Recommendations may be updated based on patient status, additional functional criteria and insurance authorization.  Follow up Recommendations No SLP follow up      Assistance Recommended at Discharge  N/a  Functional Status Assessment Patient has not had a recent decline in their functional status  Frequency and Duration     N/a       Prognosis    N/a    Swallow Study   General Date of Onset: 07/02/22 HPI: Pt is an 82 yo male adm to [facility] with fx and prior pinning site from April 2024 hip fx - s/p surgery on 07/01/2022.  Per notes, pt has had progressive cognitive decline recently.  Swallow eval ordered but pt was not opening mouth. Pt has h/o dementia and Parkinsons disease.  Per family report to RD pt does better with soft foods. , UGI 2004 THE SWALLOWING FUNCTION IS NORMAL.  THERE IS NOTED TO BE A SLIDING-TYPE HIATAL HERNIA MEASURING 3-4  CM. NO DEFINITE GASTROESOPHAGEAL REFLUX IS DEMONSTRATED HOWEVER.  THERE IS NOTED TO BE MODERATE  SPASM OF THE ESOPHAGUS.  THERE IS PARTIAL OBSTRUCTION AT THE GASTROESOPHAGEAL JUNCTION TO THE  PASSAGE OF A 13 MM TABLET.  Wife and daughter present. Wife reports pt has been holding liquids in his mouth at times and has prolonged mastication but she denies him coughing with intake.  Reports weight loss since at the SNF but states the facility has "good food". Type of Study: Bedside Swallow Evaluation Diet Prior to this Study: Regular;Thin liquids (Level 0) Temperature Spikes Noted: No Respiratory Status: Room air History of Recent Intubation: Yes (for surgery) Behavior/Cognition: Alert;Distractible;Doesn't follow directions;Other (Comment) (inconsistently follows directions - approx 25% of opportunities) Oral Cavity Assessment: Within Functional Limits Oral Care Completed by SLP: No Oral Cavity - Dentition: Adequate natural dentition Self-Feeding Abilities: Needs assist;Other (Comment) (can hold his own cup and  cracker -difficulty with fork, spoon) Patient Positioning: Upright in bed Baseline Vocal Quality: Normal Volitional Cough: Cognitively unable to elicit Volitional Swallow: Unable to elicit    Oral/Motor/Sensory Function Overall Oral Motor/Sensory Function: Other (comment) (slight diminished left nasal crease and labial asymmetry as well as decreased eye opening on left, family states this is normal)   Ice Chips Ice chips: Not tested   Thin Liquid Thin Liquid: Within functional limits Presentation: [employer]  Nectar Thick Nectar Thick Liquid: Within functional limits Presentation: Straw;Self Fed   Honey Thick Honey Thick Liquid: Not tested   Puree Puree: Not tested Other Comments: pt declined to consume icecream alone   Solid     Solid: Within functional limits Presentation: Self Fed Other Comments: pt able to masticate solid cracker adequately and clear without delay      Chales Abrahams 07/02/2022,6:47 PM Rolena Infante, MS [facility] SLP Acute Rehab Services Office [phone]

## 2022-07-02 NOTE — Evaluation (Signed)
SLP Cancellation Note  Patient Details Name: Joe Murray MRN: [id number] DOB: 30-Oct-1940   Cancelled treatment:       Reason Eval/Treat Not Completed: Other (comment) (RN reports pt sleepy at this time; will continue efforts)  Rolena Infante, MS [facility] SLP Acute Rehab Services Office [phone]  Chales Abrahams 07/02/2022, 8:18 AM

## 2022-07-03 ENCOUNTER — Encounter (HOSPITAL_COMMUNITY): Payer: Self-pay | Admitting: Orthopedic Surgery

## 2022-07-03 DIAGNOSIS — Z7189 Other specified counseling: Secondary | ICD-10-CM | POA: Diagnosis not present

## 2022-07-03 DIAGNOSIS — Z515 Encounter for palliative care: Secondary | ICD-10-CM | POA: Diagnosis not present

## 2022-07-03 DIAGNOSIS — S7292XK Unspecified fracture of left femur, subsequent encounter for closed fracture with nonunion: Secondary | ICD-10-CM | POA: Diagnosis not present

## 2022-07-03 DIAGNOSIS — G8918 Other acute postprocedural pain: Secondary | ICD-10-CM | POA: Diagnosis not present

## 2022-07-03 DIAGNOSIS — G301 Alzheimer's disease with late onset: Secondary | ICD-10-CM | POA: Diagnosis not present

## 2022-07-03 LAB — BASIC METABOLIC PANEL
Anion gap: 10 (ref 5–15)
BUN: 26 mg/dL — ABNORMAL HIGH (ref 8–23)
CO2: 22 mmol/L (ref 22–32)
Calcium: 8.3 mg/dL — ABNORMAL LOW (ref 8.9–10.3)
Chloride: 104 mmol/L (ref 98–111)
Creatinine, Ser: 1.14 mg/dL (ref 0.61–1.24)
GFR, Estimated: >60 mL/min (ref >60)
Glucose, Bld: 138 mg/dL — ABNORMAL HIGH (ref 70–99)
Potassium: 3.3 mmol/L — ABNORMAL LOW (ref 3.5–5.1)
Sodium: 136 mmol/L (ref 135–145)

## 2022-07-03 LAB — CBC
HCT: 34.8 % — ABNORMAL LOW (ref 39.0–52.0)
Hemoglobin: 11.1 g/dL — ABNORMAL LOW (ref 13.0–17.0)
MCH: 31.4 pg (ref 26.0–34.0)
MCHC: 31.9 g/dL (ref 30.0–36.0)
MCV: 98.3 fL (ref 80.0–100.0)
Platelets: 286 10*3/uL (ref 150–400)
RBC: 3.54 MIL/uL — ABNORMAL LOW (ref 4.22–5.81)
RDW: 13.3 % (ref 11.5–15.5)
WBC: 9.6 10*3/uL (ref 4.0–10.5)
nRBC: 0 % (ref 0.0–0.2)

## 2022-07-03 LAB — GLUCOSE, CAPILLARY
Glucose-Capillary: 120 mg/dL — ABNORMAL HIGH (ref 70–99)
Glucose-Capillary: 152 mg/dL — ABNORMAL HIGH (ref 70–99)
Glucose-Capillary: 153 mg/dL — ABNORMAL HIGH (ref 70–99)
Glucose-Capillary: 143 mg/dL — ABNORMAL HIGH (ref 70–99)

## 2022-07-03 MED ORDER — HYDROCODONE-ACETAMINOPHEN 5-325 MG PO TABS: 1.0000 | ORAL_TABLET | ORAL | 0 refills | Status: AC | PRN

## 2022-07-03 MED ORDER — ENOXAPARIN SODIUM 40 MG/0.4ML IJ SOSY: 40.0000 mg | PREFILLED_SYRINGE | INTRAMUSCULAR | 0 refills | Status: AC

## 2022-07-03 MED ORDER — FOLIC ACID 1 MG PO TABS
1.0000 mg | ORAL_TABLET | Freq: Every day | ORAL | Status: DC
  Administered 2022-07-04: 1 mg via ORAL
  Filled 2022-07-03: qty 1

## 2022-07-03 MED ORDER — CEFADROXIL 500 MG PO CAPS: 500.0000 mg | ORAL_CAPSULE | Freq: Two times a day (BID) | ORAL | 0 refills | Status: AC

## 2022-07-03 MED ORDER — POTASSIUM CHLORIDE 20 MEQ PO PACK
20.0000 meq | PACK | ORAL | Status: AC
  Administered 2022-07-03 (×3): 20 meq via ORAL
  Filled 2022-07-03 (×3): qty 1

## 2022-07-03 NOTE — Progress Notes (Signed)
Subjective:  Patient lying comfortably in bed and more of a neutral supine position than he was able to do before surgery.  Rests with his eyes closed does not answer questions or follow commands.  Hemoglobin 11.1 this AM, appears stable.  Objective:   VITALS:   Vitals:   07/02/22 1014 07/02/22 1333 07/02/22 2147 07/03/22 0641  BP: (!) 112/49 122/75 (!) 184/78 (!) 146/55  Pulse: (!) 51 (!) 57 66 60  Resp: 17 17 16 15   Temp: 97.6 F (36.4 C) 98 F (36.7 C) 98.5 F (36.9 C) 98.4 F (36.9 C)  TempSrc: Oral  Oral Oral  SpO2: 98% 100% 96% 98%  Weight:      Height:        Intact pulses distally Incision: dressing C/D/I Compartment soft Patient able to wiggle toes and gently move left upper extremity with command, does not cooperative with remaining motor or sensory exam due to baseline dementia  Lab Results  Component Value Date   WBC 9.6 07/03/2022   HGB 11.1 (L) 07/03/2022   HCT 34.8 (L) 07/03/2022   MCV 98.3 07/03/2022   PLT 286 07/03/2022   BMET    Component Value Date/Time   NA 136 07/03/2022 0353   NA 142 06/01/2019 0824   K 3.3 (L) 07/03/2022 0353   CL 104 07/03/2022 0353   CO2 22 07/03/2022 0353   GLUCOSE 138 (H) 07/03/2022 0353   BUN 26 (H) 07/03/2022 0353   BUN 22 06/01/2019 0824   CREATININE 1.14 07/03/2022 0353   CREATININE 1.26 (H) 11/07/2019 1048   CALCIUM 8.3 (L) 07/03/2022 0353   GFRNONAA >60 07/03/2022 0353     Xray: PACU x-rays demonstrated cemented hip and knee arthroplasty without adverse features  Assessment/Plan: 2 Days Post-Op  S/p L hip cannulated screw removal conversion to hip hemiarthroplasty  Principal Problem:   Fracture of left femur with nonunion Active Problems:   GERD   Type 2 diabetes mellitus with stage 3a chronic kidney disease, without long-term current use of insulin (HCC)   Coronary artery disease involving native coronary artery of native heart without angina pectoris   Parkinson's disease   Alzheimer's  dementia (HCC)   Chronic kidney disease, stage 3a (HCC)   Unstageable pressure ulcer of left heel (HCC)   Mixed diabetic hyperlipidemia associated with type 2 diabetes mellitus (HCC)   Pressure injury of left ankle, stage 2 (HCC)   Goals of care, counseling/discussion   Folate deficiency   Acute postoperative pain of left hip   Need for emotional support   Counseling and coordination of care   Palliative care encounter   Post op recs: WB: WBAT with posterior hip precautions x6 weeks Abx: Continue Ancef x 3 days, discharge cefadroxil 500 twice daily x7 days given increased risk of infection due to multiple recent surgeries Imaging: PACU xrays Dressing: Aquacel dressing to be kept intact until follow-up DVT prophylaxis: lovenox starting POD1 x4 weeks Mild likely post-operative anemia, stable Follow up: Anticipate SNF placement upon DC pending medicine.  2 weeks after surgery for a wound check with Dr. Blanchie Dessert at [facility].  Address: [address], [place], [place] [address]  Office Phone: [phone]    [name] 07/03/2022, 6:43 AM   Weber Cooks, MD  Contact information:   [phone] 7am-5pm epic message Dr. Blanchie Dessert, or call office for patient follow up: [phone] After hours and holidays please check [email or web address] for group call information for [facility]

## 2022-07-03 NOTE — Plan of Care (Signed)
  Problem: Education: Goal: Knowledge of General Education information will improve Description Including pain rating scale, medication(s)/side effects and non-pharmacologic comfort measures Outcome: Progressing   Problem: Health Behavior/Discharge Planning: Goal: Ability to manage health-related needs will improve Outcome: Progressing   

## 2022-07-03 NOTE — TOC Progression Note (Addendum)
Transition of Care [facility]) - Progression Note   Patient Details  Name: Joe Murray MRN: [id number] Date of Birth: [birth year]  Transition of Care [facility]) CM/SW Contact  Ewing Schlein, LCSW Phone Number: 07/03/2022, 12:00 PM  Clinical Narrative: CSW spoke with wife to follow up regarding discharge plan. Wife agreeable to SNF and reported patient can return to [facility], but she would like the referral to also be sent to [facility] for review. FL2 done; PASRR confirmed. Initial referral faxed out. CSW spoke with [place] in admissions at [facility] and she will come assess the patient in-person this afternoon.  Addendum: CSW received call from [place] with [facility] and the facility can accept him on Monday. CMA, Janell, completed insurance authorization. Certification # is: [id number]. CSW updated wife and hospitalist.  Expected Discharge Plan: Skilled Nursing Facility Barriers to Discharge: Insurance Authorization  Expected Discharge Plan and Services In-house Referral: Clinical Social Work Living arrangements for the past 2 months: Single Family Home  Social Determinants of Health (SDOH) Interventions SDOH Screenings   Food Insecurity: No Food Insecurity (06/30/2022)  Housing: Low Risk  (06/30/2022)  Transportation Needs: No Transportation Needs (06/30/2022)  Utilities: Not At Risk (06/30/2022)  Alcohol Screen: Low Risk  (03/30/2022)  Depression (PHQ2-9): Low Risk  (03/30/2022)  Financial Resource Strain: Low Risk  (03/30/2022)  Recent Concern: Financial Resource Strain - Medium Risk (01/06/2022)  Physical Activity: Inactive (03/30/2022)  Social Connections: Socially Integrated (03/30/2022)  Stress: No Stress Concern Present (03/30/2022)  Tobacco Use: High Risk (07/01/2022)   Readmission Risk Interventions    06/01/2022   11:19 AM  Readmission Risk Prevention Plan  Post Dischage Appt Complete  Medication Screening Complete  Transportation Screening Complete

## 2022-07-03 NOTE — Progress Notes (Signed)
Daily Progress Note   Patient Name: Joe Murray       Date: 07/03/2022 DOB: [birth year]  Age: 82 y.o. MRN#: [id number] Attending Physician: [place], Eric J, DO Primary Care Physician: Nelwyn Salisbury, MD Admit Date: 06/29/2022 Length of Stay: 4 days  Reason for Consultation/Follow-up: Establishing goals of care  Subjective:   CC: Patient seen laying comfortably in bed.  Patient will interact and appropriately answer and sometimes the mumbles other times.  Spoke with patient's daughter who was at bedside following up regarding complex medical decision making.  Subjective:  Reviewed EMR prior to presenting to bedside.  Patient's wife has elected to pursue rehab placement.  TOC assisting with orientation of care for this.  When seeing patient today, he is laying comfortably in bed.  Patient will interact and appropriate answer simple questions at times though mumbles and speaks nonsensically at others.  Patient's daughter present at bedside.  Introduced myself and the role of the palliative medicine team.  Reviewed discussion from yesterday had with her mother regarding support of medical care moving forward and possible pathways to consider.  Right now hoping patient will do well in rehab.  Discussed possibility of palliative medicine referral in the outpatient setting to visit him to assist with care coordination.  Also discussed that should patient not progress, could consider hospice involvement.  Discussed in general the similarities and differences between palliative medicine and hospice.  Daughter notes they will consider if they would like palliative medicine referral as she and her mother are worried about cost.  Noted they could follow-up at rehab to inquire about this in the future.  All questions answered at that time.  Thanked patient and his daughter for allowing me to visit today.   Objective:   Vital Signs:  BP (!) 146/55 (BP Location: Right Arm)   Pulse 60   Temp 98.4 F  (36.9 C) (Oral)   Resp 15   Ht 5\' 8"  (1.727 m)   Wt 85.9 kg   SpO2 98%   BMI 28.79 kg/m   Physical Exam: General: NAD, laying in bed, chronically ill appearing  Eyes: No drainage noted HENT:  moist mucous membranes Cardiovascular: RRR Respiratory: no increased work of breathing noted, not in respiratory distress Abdomen: not distended Skin: Wounds noted on skin from pressure, review EMR regarding this Neuro: Pleasantly confused  Imaging:  I personally reviewed recent imaging.   Assessment & Plan:   Assessment: Patient is an 82 year old male with a past medical history of dementia, Parkinson's disease, CKD stage IIIa, diabetes mellitus type 2, hypertension, CAD, and GERD who was admitted on 06/29/2022 for management of left femoral neck fracture status post pinning during recent hospitalization on 05/27/22.  Patient has been discharged to [facility] for rehab after this though family notes medical status has continued to deteriorate.  Orthopedics was consulted upon admission and was recommended patient undergo a left hip arthroplasty due to imaging showing left femoral head fracture with nonunion.  Patient underwent surgery on 07/01/2022.  Palliative medicine team consulted to assist with complex medical decision making.   Recommendations/Plan: # Complex medical decision making/goals of care:   -Patient unable to discuss complex medical decision making at this time due to underlying medical status.                -Conversation with daughter as noted above in HPI.  Again reviewed possible pathways for medical care moving forward.  Offered additional supports available depending on course including palliative  medicine referral versus evaluation for hospice if patient deteriorates after rehab. Daughter noted she and her mother will inform teams if palliative medicine outpatient referral requested. Noted they could pursue this at rehab at well.                 -Patient has MOST form on file  from 05/30/2022.                -  Code Status: DNR    # Symptom management                -Wife had stated primary goal is that patient be comfortable. Would consider keeping Tylenol 1000mg  scheduled q8hrs during the day. Do NOT recommend tramadol in this elderly individual with history of falls as it has multiple interactions in the elderly. Would instead consider low dose oxycodone 2.5mg  q6hrs prn if opioid is needed.    # Psycho-social/Spiritual Support:  - Support System: wife, daughter   # Discharge Planning: Rehab. Family reluctant for outpatient palliative medicine referral at this time.    Thank you for allowing the palliative care team to participate in the care Leane Platt. PMT will be able as needed. Please reach out if acute need arise.   Alvester Morin, DO Palliative Care Provider PMT # [phone]  If patient remains symptomatic despite maximum doses, please call PMT at [phone] between 0700 and 1900. Outside of these hours, please call attending, as PMT does not have night coverage.  This provider spent a total of 36 minutes providing patient's care.  Includes review of EMR, discussing care with other staff members involved in patient's medical care, obtaining relevant history and information from patient and/or patient's family, and personal review of imaging and lab work. Greater than 50% of the time was spent counseling and coordinating care related to the above assessment and plan.    *Please note that this is a verbal dictation therefore any spelling or grammatical errors are due to the "Dragon Medical One" system interpretation.

## 2022-07-03 NOTE — Progress Notes (Addendum)
PROGRESS NOTE    Joe Murray  ZOX:[id number] DOB: 06-13-1940 DOA: 06/29/2022 PCP: Nelwyn Salisbury, MD    Brief Narrative:   Joe Murray is a 82 y.o. male with past medical history significant for dementia, Parkinson's disease, CKD stage IIIa, non-insulin-dependent DM 2, HTN, CAD (PCI 02/2015 with DES to RCA and mid-LAD), GERD and recent hospitalization for left femoral neck fracture status post percutaneous pinning by Dr. Blanchie Dessert 05/27/2022 who presented to [facility] ED on 5/6 with left hip pain and inability to ambulate.  Following his recent surgery, patient was discharged to [facility] for rehabilitation.  Of note, family reports declining cognition since April.  Patient unable to provide any further history due to his dementia.  In the ED, temperature 98.9 F, HR 65, RR 26, BP 139/67, SpO2 96% on room air.  WBC 16.2, hemoglobin 13.0, platelets 270.  Sodium 138, potassium 4.3, chloride 104, CO2 25, glucose 25, BUN 28, creatinine 1.26.  AST 35, ALT 16, total bilirubin 0.7.  Imaging notable for shortening of the left femoral neck with impaction.  Orthopedics was consulted and [facility] consulted for admission for further evaluation and management.  Assessment & Plan:   Left femoral neck fracture Patient with increased left hip pain over the previous 4 days prior to admission with inability to ambulate.  Underwent recent percutaneous pinning by Dr. Blanchie Dessert on 4/3 for left femoral neck fracture.  Imaging notable for left femoral head fracture with nonunion.  Patient's family initially apprehensive about undergoing repair but after discussions with orthopedics they were agreeable to proceed and patient underwent removal of cannulated screws and conversion to left hip bipolar hemiarthroplasty by Dr. Blanchie Dessert on 07/01/2022.  Vitamin D 25 hydroxy level 45.21, within normal limits. -- Orthopedics following, appreciate assistance -- Continue antibiotics with Ancef x 3 days,  transition to cefadroxil 500mg  BID x 7 days per orthoipedics -- WBAT with posterior hip precautions x 6 weeks -- Lovenox for postoperative DVT prophylaxis x 4 weeks per orthopedics -- Tylenol 650 mg p.o. BID -- Tramadol 50 mg p.o. every 6 hours as needed moderate/severe pain -- Dilaudid 0.5-1 mg IV every 4 hours as needed severe pain not relieved with oral medication -- Robaxin 500 mg p.o./IV every 6 hours.  Muscle spasms -- PT/OT recommending SNF, TOC for placement -- Follow-up orthopedics 2 weeks for wound check  Hypokalemia Potassium 3.3 this morning, will replete. -- Replete electrolytes in a.m. to include magnesium  Essential hypertension CAD Currently not on antihypertensives outpatient. -- Hydralazine 5 mg IV q6h PRN SBP >165  Type 2 diabetes mellitus Hemoglobin A1c 7.2.  Home medications include glipizide 5 mg p.o. twice daily. -- Hold oral hypoglycemics while inpatient -- SSI for coverage -- CBGs qAC/HS  Left heel pressure wound, POA Pressure Injury 06/30/22 Heel Left Unstageable - Full thickness tissue loss in which the base of the injury is covered by slough (yellow, tan, gray, green or brown) and/or eschar (tan, brown or black) in the wound bed. (Active)  06/30/22 0715  Location: Heel  Location Orientation: Left  Staging: Unstageable - Full thickness tissue loss in which the base of the injury is covered by slough (yellow, tan, gray, green or brown) and/or eschar (tan, brown or black) in the wound bed.  Wound Description (Comments):   Present on Admission: Yes  -- Seen by wound care RN -- Paint left heel ulcer with betadine daily, allow to air dry -- Offload heel at all times with Prevalon boot --  Silicone foam to the left ankle wound   Parkinson disease/dementia -- Namenda 10 mg p.o. twice daily -- Sinemet 3 times daily -- Seroquel 75 mg p.o. nightly -- Melatonin 3 mg p.o. nightly    DVT prophylaxis: enoxaparin (LOVENOX) injection 40 mg Start: 07/02/22  0800 SCDs Start: 07/01/22 1935 Place TED hose Start: 07/01/22 1935 SCDs Start: 06/29/22 1210    Code Status: DNR Family Communication: Updated spouse present at bedside yesterday morning, no family present at bedside today  Disposition Plan:  Level of care: Med-Surg Status is: Inpatient Remains inpatient appropriate because: Pending SNF placement, per [facility]    Consultants:  Orthopedics, Dr. Blanchie Dessert Palliative care  Procedures:  removal of cannulated screws and conversion to left hip bipolar hemiarthroplasty by Dr. Blanchie Dessert on 07/01/2022  Antimicrobials:  Cefazolin 5/8>>   Subjective: Seen and examined at bedside, alert, pleasantly confused.  Denies pain.  No family present this morning.  Unable to obtain any further ROS from patient due to his underlying dementia.  Reportedly has been eating well per nursing staff.  No acute concerns overnight per nursing staff.  Remains on IV antibiotics until tomorrow per orthopedics, pending SNF placement.  Objective: Vitals:   07/02/22 1014 07/02/22 1333 07/02/22 2147 07/03/22 0641  BP: (!) 112/49 122/75 (!) 184/78 (!) 146/55  Pulse: (!) 51 (!) 57 66 60  Resp: 17 17 16 15   Temp: 97.6 F (36.4 C) 98 F (36.7 C) 98.5 F (36.9 C) 98.4 F (36.9 C)  TempSrc: Oral  Oral Oral  SpO2: 98% 100% 96% 98%  Weight:      Height:        Intake/Output Summary (Last 24 hours) at 07/03/2022 1143 Last data filed at 07/03/2022 0900 Gross per 24 hour  Intake 2215.75 ml  Output 1650 ml  Net 565.75 ml   Filed Weights   06/29/22 0855 06/30/22 1134  Weight: 98.5 kg 85.9 kg    Examination:  Physical Exam: GEN: NAD, alert, pleasantly confused, elderly/chronically ill in appearance HEENT: NCAT, PERRL, EOMI, sclera clear, MMM PULM: CTAB w/o wheezes/crackles, normal respiratory effort, on room air CV: RRR w/o M/G/R GI: abd soft, NTND, NABS, no R/G/M MSK: no peripheral edema, moving all extremities independently, surgical dressing noted to left  hip without surrounding fluctuance/erythema or significant ecchymosis, clean/dry/intact, left heel wound noted as below      Data Reviewed: I have personally reviewed following labs and imaging studies  CBC: Recent Labs  Lab 06/29/22 1004 07/01/22 0358 07/02/22 0355 07/03/22 0353  WBC 16.2* 13.6* 10.8* 9.6  NEUTROABS 13.2* 10.5*  --   --   HGB 13.0 11.6* 11.0* 11.1*  HCT 40.8 36.8* 34.6* 34.8*  MCV 96.0 99.2 96.6 98.3  PLT 270 262 249 286   Basic Metabolic Panel: Recent Labs  Lab 06/29/22 1004 07/01/22 0358 07/02/22 0355 07/03/22 0353  NA 138 142 138 136  K 4.3 3.9 4.5 3.3*  CL 104 109 105 104  CO2 25 25 23 22   GLUCOSE 185* 128* 223* 138*  BUN 28* 24* 29* 26*  CREATININE 1.26* 1.13 1.12 1.14  CALCIUM 8.9 9.1 8.7* 8.3*  MG 2.1 2.0 2.0  --    GFR: Estimated Creatinine Clearance: 53.3 mL/min (by C-G formula based on SCr of 1.14 mg/dL). Liver Function Tests: Recent Labs  Lab 06/29/22 1004 07/01/22 0358  AST 35 39  ALT 16 7  ALKPHOS 92 106  BILITOT 0.7 0.8  PROT 7.0 6.4*  ALBUMIN 3.2* 2.5*   No results for  input(s): "LIPASE", "AMYLASE" in the last 168 hours. Recent Labs  Lab 06/30/22 1033  AMMONIA 23   Coagulation Profile: Recent Labs  Lab 07/01/22 0358  INR 1.1   Cardiac Enzymes: No results for input(s): "CKTOTAL", "CKMB", "CKMBINDEX", "TROPONINI" in the last 168 hours. BNP (last 3 results) No results for input(s): "PROBNP" in the last 8760 hours. HbA1C: No results for input(s): "HGBA1C" in the last 72 hours.  CBG: Recent Labs  Lab 07/02/22 1124 07/02/22 1618 07/02/22 2150 07/03/22 0723 07/03/22 1100  GLUCAP 158* 140* 135* 120* 152*   Lipid Profile: No results for input(s): "CHOL", "HDL", "LDLCALC", "TRIG", "CHOLHDL", "LDLDIRECT" in the last 72 hours. Thyroid Function Tests: No results for input(s): "TSH", "T4TOTAL", "FREET4", "T3FREE", "THYROIDAB" in the last 72 hours.  Anemia Panel: No results for input(s): "VITAMINB12", "FOLATE",  "FERRITIN", "TIBC", "IRON", "RETICCTPCT" in the last 72 hours.  Sepsis Labs: No results for input(s): "PROCALCITON", "LATICACIDVEN" in the last 168 hours.  Recent Results (from the past 240 hour(s))  Surgical PCR screen     Status: None   Collection Time: 06/30/22  9:46 PM   Specimen: Nasal Mucosa; Nasal Swab  Result Value Ref Range Status   MRSA, PCR NEGATIVE NEGATIVE Final   Staphylococcus aureus NEGATIVE NEGATIVE Final    Comment: (NOTE) The Xpert SA Assay (FDA approved for NASAL specimens in patients 42 years of age and older), is one component of a comprehensive surveillance program. It is not intended to diagnose infection nor to guide or monitor treatment. Performed at [facility], [address], [place], [place] [address]          Radiology Studies: DG HIP UNILAT W OR W/O PELVIS 2-3 VIEWS LEFT  Result Date: 07/01/2022 CLINICAL DATA:  Postop left hip. EXAM: DG HIP (WITH OR WITHOUT PELVIS) 2-3V LEFT COMPARISON:  Radiograph 06/29/2022 FINDINGS: Left hip arthroplasty in expected alignment. No periprosthetic lucency or fracture. Recent postsurgical change includes air and edema in the soft tissues. IMPRESSION: Left hip arthroplasty without immediate postoperative complication. Electronically Signed   By: Narda Rutherford M.D.   On: 07/01/2022 15:19        Scheduled Meds:  acetaminophen  650 mg Oral BID   carbidopa-levodopa  1 tablet Oral TID   cyanocobalamin  1,000 mcg Oral Daily   docusate sodium  100 mg Oral BID   enoxaparin (LOVENOX) injection  40 mg Subcutaneous Q24H   feeding supplement  237 mL Oral BID BM   folic acid  1 mg Intravenous Daily   insulin aspart  0-9 Units Subcutaneous TID WC   magnesium chloride  256 mg Oral Daily   melatonin  3 mg Oral QHS   memantine  10 mg Oral BID   multivitamin with minerals  1 tablet Oral Daily   pantoprazole  40 mg Oral Daily   potassium chloride  20 mEq Oral Q4H   prednisoLONE acetate  1 drop Right  Eye Daily   QUEtiapine  75 mg Oral QHS   senna  1 tablet Oral BID   Continuous Infusions:  sodium chloride 50 mL/hr at 07/03/22 1123    ceFAZolin (ANCEF) IV 2 g (07/03/22 0557)   lactated ringers 75 mL/hr at 07/02/22 2029   methocarbamol (ROBAXIN) IV       LOS: 4 days    Time spent: 51 minutes spent on chart review, discussion with nursing staff, consultants, updating family and interview/physical exam; more than 50% of that time was spent in counseling and/or coordination of  care.    Alvira Philips [place], DO [facility] Available via Epic secure chat 7am-7pm After these hours, please refer to coverage provider listed on [email or web address] 07/03/2022, 11:43 AM

## 2022-07-03 NOTE — Evaluation (Signed)
Occupational Therapy Evaluation Patient Details Name: Joe Murray MRN: [id number] DOB: Mar 24, 1940 Today's Date: 07/03/2022   History of Present Illness Joe Murray is a 82 yr old male who was admitted to the hospital from [facility] rehab, due to increased pain. Of note, pt underwent a L hip fracture repair on 05-27-2022 (posterior hip precautions). He was found to have nonunion of L femoral neck fx & is s/p removal of screws & conversion of L hip bipolar hemiarthroplasty on 07-01-22. PMH: Alzheimer's dementia, vertigo, psoriasis, SVT, DM II, hemorrhoids, diverticulosis, bradycardia, insomnoa, basal cell carcinoma, Parkinson's   Clinical Impression   The pt is currently presenting significantly below his baseline level of functioning for self-care management, given the below listed functional deficits. He was noted to be with noted cognitive limitations, as pertains to memory/recall, problem solving, attention, insight, and safety awareness; per pt's daughter, his overall cognition has been worse since his initial L hip surgery in April.  At current, he requires 2 person assist for progressive activity, including sit to stand transfers & toileting. Without further OT services, he is at high risk for restricted ADL participation and progressive functional decline.      Recommendations for follow up therapy are one component of a multi-disciplinary discharge planning process, led by the attending physician.  Recommendations may be updated based on patient status, additional functional criteria and insurance authorization.   Assistance Recommended at Discharge Frequent or constant Supervision/Assistance  Patient can return home with the following Assist for transportation;Assistance with cooking/housework;Direct supervision/assist for medications management;Two people to help with walking and/or transfers;Two people to help with bathing/dressing/bathroom;Assistance with feeding    Functional  Status Assessment  Patient has had a recent decline in their functional status and demonstrates the ability to make significant improvements in function in a reasonable and predictable amount of time.  Equipment Recommendations  Other (comment) (to be determined pending progress at next setting)       Precautions / Restrictions Precautions Precautions: Posterior Hip Restrictions Weight Bearing Restrictions: No LLE Weight Bearing: Weight bearing as tolerated      Mobility Bed Mobility Overal bed mobility: Needs Assistance Bed Mobility: Supine to Sit, Sit to Supine, Rolling     Supine to sit: Total assist, +2 for physical assistance Sit to supine: Total assist, +2 for physical assistance  Rolling left & right: Maximal Assistance x2      Transfers Overall transfer level: Needs assistance Equipment used: Rolling walker (2 wheels)   Sit to Stand: From elevated surface, +2 physical assistance, Max assist           General transfer comment: He performed 2 sit to stands from EOB, requiring max verbal and tactile cues for general sequencing/transfer technique and safety, including BLE positioning, hand placement, pushing with BLE, and trunk extension. He only achieved minimal standing during the 1st stand attempt.      Balance       Sitting balance - Comments: intially required increased assist, however progressed to brief instances of SBA to min guard     Standing balance-Leahy Scale: Zero             ADL either performed or assessed with clinical judgement   ADL Overall ADL's : Needs assistance/impaired Eating/Feeding: Moderate assistance;Bed level;Cueing for safety Eating/Feeding Details (indicate cue type and reason): based on clinical judgement, and pt's daughter report of pt requiring assistance with self-feeding at current Grooming: Maximal assistance;Bed level Grooming Details (indicate cue type and reason): based on clinical  judgement         Upper Body  Dressing : Cueing for sequencing;Bed level;Maximal assistance   Lower Body Dressing: Total assistance;Bed level       Toileting- Clothing Manipulation and Hygiene: Total assistance;Bed level              Pertinent Vitals/Pain Pain Assessment Pain Assessment: Faces Pain Score: 0-No pain     Hand Dominance Right   Extremity/Trunk Assessment Upper Extremity Assessment Upper Extremity Assessment: Difficult to assess due to impaired cognition. He appeared to have functional BUE elbow and hand AROM and functional grip strength, based on observation of abilities.      Communication Communication Communication: Expressive difficulties   Cognition Arousal/Alertness: Awake/alert   Overall Cognitive Status: History of cognitive impairments - at baseline Area of Impairment: Attention, Memory, Safety/judgement, Awareness, Orientation, Following commands                 Orientation Level: Place, Time, Situation, Disoriented to   Memory: Decreased short-term memory, Decreased recall of precautions Following Commands: Follows one step commands inconsistently Safety/Judgement: Decreased awareness of safety, Decreased awareness of deficits                      Home Living Family/patient expects to be discharged to:: Skilled nursing facility           Prior Functioning/Environment Prior Level of Function : Needs assist             Mobility Comments: Admitted from SNF where he did a little bit of walking in parallel bars (per PT note) ADLs Comments:  (Information was obtained from pt's family, as the pt was unable to provide info, due to impaired cognition. Prior to going to SNF for rehab, he lived with his spouse, he used a RW for household ambulation, he required set-up assist/ supervision for feeding, supervision for bathing, and min occasional assist for toileting; his spouse managed the household chores.         OT Problem List: Decreased strength;Decreased  range of motion;Decreased activity tolerance;Impaired balance (sitting and/or standing);Decreased cognition;Decreased coordination;Decreased safety awareness;Decreased knowledge of use of DME or AE;Decreased knowledge of precautions      OT Treatment/Interventions: Self-care/ADL training;Therapeutic exercise;Balance training;Therapeutic activities;Energy conservation;Cognitive remediation/compensation;DME and/or AE instruction    OT Goals(Current goals can be found in the care plan section) Acute Rehab OT Goals OT Goal Formulation: Patient unable to participate in goal setting Time For Goal Achievement: 07/17/22 Potential to Achieve Goals: Fair ADL Goals Pt Will Perform Eating: with supervision;sitting Pt Will Perform Grooming: with min assist;sitting Pt Will Perform Upper Body Dressing: with min assist;sitting Pt Will Transfer to Toilet: with min assist;stand pivot transfer;bedside commode  OT Frequency: Min 1X/week       AM-PAC OT "6 Clicks" Daily Activity     Outcome Measure Help from another person eating meals?: A Lot Help from another person taking care of personal grooming?: A Lot Help from another person toileting, which includes using toliet, bedpan, or urinal?: Total Help from another person bathing (including washing, rinsing, drying)?: A Lot Help from another person to put on and taking off regular upper body clothing?: A Lot Help from another person to put on and taking off regular lower body clothing?: Total 6 Click Score: 10   End of Session Equipment Utilized During Treatment: Gait belt;Rolling walker (2 wheels) Nurse Communication: Mobility status  Activity Tolerance: Other (comment) (Fair tolerance. Limited by impaired cognition) Patient left: in bed;with call  bell/phone within reach;with bed alarm set;with family/visitor present  OT Visit Diagnosis: Unsteadiness on feet (R26.81);Muscle weakness (generalized) (M62.81)                Time: 1000-1043 OT Time  Calculation (min): 43 min Charges:  OT Evaluation $OT Eval Moderate Complexity: 1 Mod OT Treatments $Therapeutic Activity: 8-22 mins   Reuben Likes, OTR/L 07/03/2022, 1:21 PM

## 2022-07-03 NOTE — [place] FL2 (Signed)
[place] [place] MEDICAID FL2 LEVEL OF CARE FORM     IDENTIFICATION  Patient Name: Joe Murray Birthdate: 1941-01-02 Sex: male Admission Date (Current Location): 06/29/2022  [facility] and [place] Number:  Producer, television/film/video and Address:  [facility],  [address] [place], [place] [address]      Provider Number: [id number]  Attending Physician Name and Address:  [place], Eric J, DO  Relative Name and Phone Number:  Josaih Zurcher (spouse) Ph: [phone]    Current Level of Care: Hospital Recommended Level of Care: Skilled Nursing Facility Prior Approval Number:    Date Approved/Denied:   PASRR Number: [id number]  Discharge Plan: SNF    Current Diagnoses: Patient Active Problem List   Diagnosis Date Noted   Acute postoperative pain of left hip 07/02/2022   Need for emotional support 07/02/2022   Counseling and coordination of care 07/02/2022   Palliative care encounter 07/02/2022   Goals of care, counseling/discussion 07/01/2022   Folate deficiency 07/01/2022   Unstageable pressure ulcer of left heel (HCC) 06/30/2022   Mixed diabetic hyperlipidemia associated with type 2 diabetes mellitus (HCC) 06/30/2022   Pressure injury of left ankle, stage 2 (HCC) 06/30/2022   Closed left hip fracture, sequela 06/29/2022   Fracture of left femur with nonunion 05/25/2022   Chronic kidney disease, stage 3a (HCC) 05/25/2022   Pseudophakia of both eyes 05/17/2022   Status post corneal transplant 05/17/2022   Myalgia due to statin 01/27/2021   Parkinson's disease 11/07/2020   Alzheimer's dementia (HCC) 11/07/2020   Intermittent claudication (HCC) 12/01/2017   Trochanteric bursitis of right hip 09/04/2017   Fuchs' corneal dystrophy 08/14/2016   Essential hypertension -> allowing permissive hypertension.  Medication stopped because of fatigue and orthostatic dizziness. 03/05/2015   NSTEMI (non-ST elevated myocardial infarction) [facility])    Coronary artery disease involving  native coronary artery of native heart without angina pectoris    Vitamin D deficiency 07/04/2014   Type 2 diabetes mellitus with stage 3a chronic kidney disease, without long-term current use of insulin (HCC)    Chronotropic incompetence 12/25/2011   Vertigo with mild postural lightheadedness. 08/13/2009   ERECTILE DYSFUNCTION, ORGANIC 05/01/2009   GERD 04/18/2008   BPH with urinary obstruction 07/27/2007   CAD S/P percutaneous coronary angioplasty - LADx2, & RCA x 3 07/12/2001    Orientation RESPIRATION BLADDER Height & Weight      (Disoriented x4)  Normal Incontinent Weight: 189 lb 6 oz (85.9 kg) Height:  5\' 8"  (172.7 cm)  BEHAVIORAL SYMPTOMS/MOOD NEUROLOGICAL BOWEL NUTRITION STATUS     (N/A) Incontinent Diet (Heart healthy/carb modified)  AMBULATORY STATUS COMMUNICATION OF NEEDS Skin   Extensive Assist Verbally Surgical wounds, Other (Comment) (Right toe (abrasion, ecchymosis))                       Personal Care Assistance Level of Assistance  Bathing, Feeding, Dressing Bathing Assistance: Maximum assistance Feeding assistance: Limited assistance Dressing Assistance: Maximum assistance     Functional Limitations Info  Sight, Hearing, Speech Sight Info: Adequate Hearing Info: Adequate Speech Info: Adequate    SPECIAL CARE FACTORS FREQUENCY  PT (By licensed PT), OT (By licensed OT)     PT Frequency: 5x's/week OT Frequency: 5x's/week            Contractures Contractures Info: Not present    Additional Factors Info  Code Status, Allergies Code Status Info: DNR Allergies Info: Beta Adrenergic Blockers, Praluent (Alirocumab), Sulfonamide Derivatives, Statins  Current Medications (07/03/2022):  This is the current hospital active medication list Current Facility-Administered Medications  Medication Dose Route Frequency Provider Last Rate Last Admin   0.9 %  sodium chloride infusion   Intravenous Continuous [name], PA-C 50 mL/hr at  06/29/22 1701 Rate Change at 06/29/22 1701   acetaminophen (TYLENOL) tablet 325-650 mg  325-650 mg Oral Q6H PRN Kathie Dike M, PA-C       acetaminophen (TYLENOL) tablet 650 mg  650 mg Oral BID Kathie Dike M, PA-C   650 mg at 07/03/22 1054   carbidopa-levodopa (SINEMET IR) 25-100 MG per tablet immediate release 1 tablet  1 tablet Oral TID [name], PA-C   1 tablet at 07/03/22 1057   ceFAZolin (ANCEF) IVPB 2g/100 mL premix  2 g Intravenous Q8H Joen Laura, MD 200 mL/hr at 07/03/22 0557 2 g at 07/03/22 0557   cyanocobalamin (VITAMIN B12) tablet 1,000 mcg  1,000 mcg Oral Daily [name], PA-C   1,000 mcg at 07/03/22 1054   diphenhydrAMINE (BENADRYL) 12.5 MG/5ML elixir 12.5-25 mg  12.5-25 mg Oral Q4H PRN Kathie Dike M, PA-C       docusate sodium (COLACE) capsule 100 mg  100 mg Oral BID Kathie Dike M, PA-C   100 mg at 07/03/22 1055   enoxaparin (LOVENOX) injection 40 mg  40 mg Subcutaneous Q24H Kathie Dike M, PA-C   40 mg at 07/03/22 1056   feeding supplement (ENSURE ENLIVE / ENSURE PLUS) liquid 237 mL  237 mL Oral BID BM Kathie Dike M, PA-C   237 mL at 07/03/22 1057   folic acid injection 1 mg  1 mg Intravenous Daily Kathie Dike M, PA-C   1 mg at 07/02/22 1327   hydrALAZINE (APRESOLINE) injection 5 mg  5 mg Intravenous Q6H PRN [place], Alvira Philips, DO       HYDROmorphone (DILAUDID) injection 0.5-1 mg  0.5-1 mg Intravenous Q4H PRN Kathie Dike M, PA-C       insulin aspart (novoLOG) injection 0-9 Units  0-9 Units Subcutaneous TID WC [name], PA-C   1 Units at 07/02/22 1619   lactated ringers infusion   Intravenous Continuous [name], PA-C 75 mL/hr at 07/02/22 2029 New Bag at 07/02/22 2029   magnesium chloride (SLOW-MAG) 64 MG SR tablet 256 mg  256 mg Oral Daily Kathie Dike M, PA-C   256 mg at 07/03/22 1055   melatonin tablet 3 mg  3 mg Oral QHS Kathie Dike M, PA-C   3 mg at 07/02/22 2237   memantine  (NAMENDA) tablet 10 mg  10 mg Oral BID Kathie Dike M, PA-C   10 mg at 07/03/22 1055   menthol-cetylpyridinium (CEPACOL) lozenge 3 mg  1 lozenge Oral PRN [name], PA-C       Or   phenol (CHLORASEPTIC) mouth spray 1 spray  1 spray Mouth/Throat PRN Kathie Dike M, PA-C       methocarbamol (ROBAXIN) tablet 500 mg  500 mg Oral Q6H PRN Kathie Dike M, PA-C       Or   methocarbamol (ROBAXIN) 500 mg in dextrose 5 % 50 mL IVPB  500 mg Intravenous Q6H PRN Kathie Dike M, PA-C       multivitamin with minerals tablet 1 tablet  1 tablet Oral Daily Kathie Dike M, PA-C   1 tablet at 07/03/22 1054   nitroGLYCERIN (NITROSTAT) SL tablet 0.4 mg  0.4 mg Sublingual Q5 min PRN [name], PA-C  ondansetron (ZOFRAN) tablet 4 mg  4 mg Oral Q6H PRN Kathie Dike M, PA-C       Or   ondansetron [facility]) injection 4 mg  4 mg Intravenous Q6H PRN Kathie Dike M, PA-C       ondansetron [facility]) tablet 4 mg  4 mg Oral Q6H PRN Kathie Dike M, PA-C       Or   ondansetron [facility]) injection 4 mg  4 mg Intravenous Q6H PRN Kathie Dike M, PA-C       pantoprazole (PROTONIX) EC tablet 40 mg  40 mg Oral Daily Kathie Dike M, PA-C   40 mg at 07/03/22 1056   polyethylene glycol (MIRALAX / GLYCOLAX) packet 17 g  17 g Oral Daily PRN Kathie Dike M, PA-C       potassium chloride (KLOR-CON) packet 20 mEq  20 mEq Oral Q4H [place], Alvira Philips, DO   20 mEq at 07/03/22 1055   prednisoLONE acetate (PRED FORTE) 1 % ophthalmic suspension 1 drop  1 drop Right Eye Daily [name], PA-C   1 drop at 07/03/22 1056   QUEtiapine (SEROQUEL) tablet 75 mg  75 mg Oral QHS Kathie Dike M, PA-C   75 mg at 07/02/22 2237   senna (SENOKOT) tablet 8.6 mg  1 tablet Oral BID Kathie Dike M, PA-C   8.6 mg at 07/03/22 1055   traMADol (ULTRAM) tablet 50 mg  50 mg Oral Q6H PRN Joen Laura, MD         Discharge Medications: Please see discharge summary for a  list of discharge medications.  Relevant Imaging Results:  Relevant Lab Results:   Additional Information SSN: [id number]  Ewing Schlein, LCSW

## 2022-07-03 NOTE — Consult Note (Signed)
WOC consulted for heel wound, see previous consult note 06/30/22. Orders are in for topical care and offloading.   Re consult if needed, will not follow at this time. Thanks  Jazsmine Macari [employer], RN,CWOCN, CNS, CWON-AP [phone])

## 2022-07-03 NOTE — Care Management Important Message (Signed)
Important Message  Patient Details IM Letter given. Name: WINNIE RUNNION MRN: [id number] Date of Birth: 1940/04/24   Medicare Important Message Given:  Yes     Caren Macadam 07/03/2022, 2:02 PM

## 2022-07-04 DIAGNOSIS — S7292XA Unspecified fracture of left femur, initial encounter for closed fracture: Secondary | ICD-10-CM

## 2022-07-04 LAB — BASIC METABOLIC PANEL
Anion gap: 10 (ref 5–15)
BUN: 13 mg/dL (ref 8–23)
CO2: 26 mmol/L (ref 22–32)
Calcium: 8.5 mg/dL — ABNORMAL LOW (ref 8.9–10.3)
Chloride: 103 mmol/L (ref 98–111)
Creatinine, Ser: 0.97 mg/dL (ref 0.61–1.24)
GFR, Estimated: >60 mL/min (ref >60)
Glucose, Bld: 160 mg/dL — ABNORMAL HIGH (ref 70–99)
Potassium: 3.9 mmol/L (ref 3.5–5.1)
Sodium: 139 mmol/L (ref 135–145)

## 2022-07-04 LAB — GLUCOSE, CAPILLARY
Glucose-Capillary: 140 mg/dL — ABNORMAL HIGH (ref 70–99)
Glucose-Capillary: 160 mg/dL — ABNORMAL HIGH (ref 70–99)
Glucose-Capillary: 192 mg/dL — ABNORMAL HIGH (ref 70–99)
Glucose-Capillary: 143 mg/dL — ABNORMAL HIGH (ref 70–99)

## 2022-07-04 LAB — MAGNESIUM: Magnesium: 1.8 mg/dL (ref 1.7–2.4)

## 2022-07-04 MED ORDER — CEFADROXIL 500 MG PO CAPS
500.0000 mg | ORAL_CAPSULE | Freq: Two times a day (BID) | ORAL | Status: DC
  Administered 2022-07-04 – 2022-07-06 (×5): 500 mg via ORAL
  Filled 2022-07-04 (×5): qty 1

## 2022-07-04 NOTE — Progress Notes (Signed)
PROGRESS NOTE    Joe Murray  ZOX:[id number] DOB: 11/22/1940 DOA: 06/29/2022 PCP: Nelwyn Salisbury, MD    Brief Narrative:   Joe Murray is a 82 y.o. male with past medical history significant for dementia, Parkinson's disease, CKD stage IIIa, non-insulin-dependent DM 2, HTN, CAD (PCI 02/2015 with DES to RCA and mid-LAD), GERD and recent hospitalization for left femoral neck fracture status post percutaneous pinning by Dr. Blanchie Dessert 05/27/2022 who presented to [facility] ED on 5/6 with left hip pain and inability to ambulate.  Following his recent surgery, patient was discharged to [facility] for rehabilitation.  Of note, family reports declining cognition since April.  Patient unable to provide any further history due to his dementia.  In the ED, temperature 98.9 F, HR 65, RR 26, BP 139/67, SpO2 96% on room air.  WBC 16.2, hemoglobin 13.0, platelets 270.  Sodium 138, potassium 4.3, chloride 104, CO2 25, glucose 25, BUN 28, creatinine 1.26.  AST 35, ALT 16, total bilirubin 0.7.  Imaging notable for shortening of the left femoral neck with impaction.  Orthopedics was consulted and [facility] consulted for admission for further evaluation and management.  Assessment & Plan:   Left femoral neck fracture Patient with increased left hip pain over the previous 4 days prior to admission with inability to ambulate.  Underwent recent percutaneous pinning by Dr. Blanchie Dessert on 4/3 for left femoral neck fracture.  Imaging notable for left femoral head fracture with nonunion.  Patient's family initially apprehensive about undergoing repair but after discussions with orthopedics they were agreeable to proceed and patient underwent removal of cannulated screws and conversion to left hip bipolar hemiarthroplasty by Dr. Blanchie Dessert on 07/01/2022.  Vitamin D 25 hydroxy level 45.21, within normal limits. -- Orthopedics following, appreciate assistance -- Completed Ancef x 3 days, now transitioned to  cefadroxil 500mg  BID x 7 days per orthoipedics -- WBAT with posterior hip precautions x 6 weeks -- Lovenox for postoperative DVT prophylaxis x 4 weeks per orthopedics -- Tylenol 650 mg p.o. BID -- Tramadol 50 mg p.o. every 6 hours as needed moderate/severe pain -- Dilaudid 0.5-1 mg IV every 4 hours as needed severe pain not relieved with oral medication -- Robaxin 500 mg p.o./IV every 6 hours.  Muscle spasms -- PT/OT recommending SNF, TOC for placement -- Follow-up orthopedics 2 weeks for wound check  Hypokalemia Repleted.  Essential hypertension CAD Currently not on antihypertensives outpatient. -- Hydralazine 5 mg IV q6h PRN SBP >165  Type 2 diabetes mellitus Hemoglobin A1c 7.2.  Home medications include glipizide 5 mg p.o. twice daily. -- Hold oral hypoglycemics while inpatient -- SSI for coverage -- CBGs qAC/HS  Left heel pressure wound, POA Pressure Injury 06/30/22 Heel Left Unstageable - Full thickness tissue loss in which the base of the injury is covered by slough (yellow, tan, gray, green or brown) and/or eschar (tan, brown or black) in the wound bed. (Active)  06/30/22 0715  Location: Heel  Location Orientation: Left  Staging: Unstageable - Full thickness tissue loss in which the base of the injury is covered by slough (yellow, tan, gray, green or brown) and/or eschar (tan, brown or black) in the wound bed.  Wound Description (Comments):   Present on Admission: Yes     Pressure Injury 06/30/22 Foot Anterior;Left;Other (Comment) Stage 2 -  Partial thickness loss of dermis presenting as a shallow open injury with a red, pink wound bed without slough. (Active)  06/30/22 0715  Location: Foot  Location  Orientation: Anterior;Left;Other (Comment) (inner)  Staging: Stage 2 -  Partial thickness loss of dermis presenting as a shallow open injury with a red, pink wound bed without slough.  Wound Description (Comments):   Present on Admission: Yes  -- Seen by wound care RN --  Paint left heel ulcer with betadine daily, allow to air dry -- Offload heel at all times with Prevalon boot -- Silicone foam to the left ankle wound   Parkinson disease/dementia -- Namenda 10 mg p.o. twice daily -- Sinemet 3 times daily -- Seroquel 75 mg p.o. nightly -- Melatonin 3 mg p.o. nightly    DVT prophylaxis: enoxaparin (LOVENOX) injection 40 mg Start: 07/02/22 0800 SCDs Start: 07/01/22 1935 Place TED hose Start: 07/01/22 1935 SCDs Start: 06/29/22 1210    Code Status: DNR Family Communication: Updated spouse present at bedside yesterday morning, no family present at bedside today  Disposition Plan:  Level of care: Med-Surg Status is: Inpatient Remains inpatient appropriate because: Pending SNF placement, per [facility]    Consultants:  Orthopedics, Dr. Blanchie Dessert Palliative care  Procedures:  Removal of cannulated screws and conversion to left hip bipolar hemiarthroplasty by Dr. Blanchie Dessert on 07/01/2022  Antimicrobials:  Cefazolin 5/8 - 5/10 Cefadroxil 5/11>>   Subjective: Seen and examined at bedside, alert, pleasantly confused.  Denies pain.  Multiple family members present to include wife, daughter.  Daughter who is a Teacher, early years/pre requesting removal of some of the "vitamins" to decrease his pill burden.  Unable to obtain any further ROS from patient due to his underlying dementia.  Patient pulled out his IV and removed partial surgical dressing overnight, otherwise no acute concerns overnight per nursing staff.  Medically stable for discharge to SNF, bed available on Monday at [facility].  Objective: Vitals:   07/03/22 0641 07/03/22 1355 07/03/22 2123 07/04/22 0611  BP: (!) 146/55 135/70 108/66 (!) 155/67  Pulse: 60 66 60 63  Resp: 15 16 17 17   Temp: 98.4 F (36.9 C) 99.1 F (37.3 C) 98.4 F (36.9 C) 98.9 F (37.2 C)  TempSrc: Oral     SpO2: 98% 98% 96% 100%  Weight:      Height:        Intake/Output Summary (Last 24 hours) at 07/04/2022 1316 Last data  filed at 07/04/2022 1126 Gross per 24 hour  Intake 2491.41 ml  Output 4000 ml  Net -1508.59 ml   Filed Weights   06/29/22 0855 06/30/22 1134  Weight: 98.5 kg 85.9 kg    Examination:  Physical Exam: GEN: NAD, alert, pleasantly confused, elderly/chronically ill in appearance HEENT: NCAT, PERRL, EOMI, sclera clear, MMM PULM: CTAB w/o wheezes/crackles, normal respiratory effort, on room air CV: RRR w/o M/G/R GI: abd soft, NTND, NABS, no R/G/M MSK: no peripheral edema, moving all extremities independently, surgical dressing noted to left hip without surrounding fluctuance/erythema or significant ecchymosis, clean/dry/intact, left heel wound noted as below      Data Reviewed: I have personally reviewed following labs and imaging studies  CBC: Recent Labs  Lab 06/29/22 1004 07/01/22 0358 07/02/22 0355 07/03/22 0353  WBC 16.2* 13.6* 10.8* 9.6  NEUTROABS 13.2* 10.5*  --   --   HGB 13.0 11.6* 11.0* 11.1*  HCT 40.8 36.8* 34.6* 34.8*  MCV 96.0 99.2 96.6 98.3  PLT 270 262 249 286   Basic Metabolic Panel: Recent Labs  Lab 06/29/22 1004 07/01/22 0358 07/02/22 0355 07/03/22 0353 07/04/22 0353  NA 138 142 138 136 139  K 4.3 3.9 4.5 3.3* 3.9  CL  104 109 105 104 103  CO2 25 25 23 22 26   GLUCOSE 185* 128* 223* 138* 160*  BUN 28* 24* 29* 26* 13  CREATININE 1.26* 1.13 1.12 1.14 0.97  CALCIUM 8.9 9.1 8.7* 8.3* 8.5*  MG 2.1 2.0 2.0  --  1.8   GFR: Estimated Creatinine Clearance: 62.6 mL/min (by C-G formula based on SCr of 0.97 mg/dL). Liver Function Tests: Recent Labs  Lab 06/29/22 1004 07/01/22 0358  AST 35 39  ALT 16 7  ALKPHOS 92 106  BILITOT 0.7 0.8  PROT 7.0 6.4*  ALBUMIN 3.2* 2.5*   No results for input(s): "LIPASE", "AMYLASE" in the last 168 hours. Recent Labs  Lab 06/30/22 1033  AMMONIA 23   Coagulation Profile: Recent Labs  Lab 07/01/22 0358  INR 1.1   Cardiac Enzymes: No results for input(s): "CKTOTAL", "CKMB", "CKMBINDEX", "TROPONINI" in the  last 168 hours. BNP (last 3 results) No results for input(s): "PROBNP" in the last 8760 hours. HbA1C: No results for input(s): "HGBA1C" in the last 72 hours.  CBG: Recent Labs  Lab 07/03/22 1100 07/03/22 1705 07/03/22 2120 07/04/22 0744 07/04/22 1134  GLUCAP 152* 153* 143* 140* 160*   Lipid Profile: No results for input(s): "CHOL", "HDL", "LDLCALC", "TRIG", "CHOLHDL", "LDLDIRECT" in the last 72 hours. Thyroid Function Tests: No results for input(s): "TSH", "T4TOTAL", "FREET4", "T3FREE", "THYROIDAB" in the last 72 hours.  Anemia Panel: No results for input(s): "VITAMINB12", "FOLATE", "FERRITIN", "TIBC", "IRON", "RETICCTPCT" in the last 72 hours.  Sepsis Labs: No results for input(s): "PROCALCITON", "LATICACIDVEN" in the last 168 hours.  Recent Results (from the past 240 hour(s))  Surgical PCR screen     Status: None   Collection Time: 06/30/22  9:46 PM   Specimen: Nasal Mucosa; Nasal Swab  Result Value Ref Range Status   MRSA, PCR NEGATIVE NEGATIVE Final   Staphylococcus aureus NEGATIVE NEGATIVE Final    Comment: (NOTE) The Xpert SA Assay (FDA approved for NASAL specimens in patients 65 years of age and older), is one component of a comprehensive surveillance program. It is not intended to diagnose infection nor to guide or monitor treatment. Performed at [facility], [address], [place], [place] [address]          Radiology Studies: No results found.      Scheduled Meds:  acetaminophen  650 mg Oral BID   carbidopa-levodopa  1 tablet Oral TID   docusate sodium  100 mg Oral BID   enoxaparin (LOVENOX) injection  40 mg Subcutaneous Q24H   feeding supplement  237 mL Oral BID BM   insulin aspart  0-9 Units Subcutaneous TID WC   magnesium chloride  256 mg Oral Daily   melatonin  3 mg Oral QHS   memantine  10 mg Oral BID   prednisoLONE acetate  1 drop Right Eye Daily   QUEtiapine  75 mg Oral QHS   senna  1 tablet Oral BID    Continuous Infusions:  sodium chloride 50 mL/hr at 07/03/22 1123   lactated ringers 75 mL/hr at 07/04/22 6045   methocarbamol (ROBAXIN) IV       LOS: 5 days    Time spent: 51 minutes spent on chart review, discussion with nursing staff, consultants, updating family and interview/physical exam; more than 50% of that time was spent in counseling and/or coordination of care.    Alvira Philips [place], DO [facility] Available via Epic secure chat 7am-7pm After these hours, please refer to coverage provider listed  on [email or web address] 07/04/2022, 1:16 PM

## 2022-07-04 NOTE — Progress Notes (Signed)
Physical Therapy Treatment Patient Details Name: Joe Murray MRN: [id number] DOB: May 23, 1940 Today's Date: 07/04/2022   History of Present Illness Joe Murray is a 82 yr old male who was admitted to the hospital from [facility] rehab, due to increased pain. Of note, pt underwent a L hip fracture repair on 05-27-2022 (posterior hip precautions). He was found to have nonunion of L femoral neck fx & is s/p removal of screws & conversion of L hip bipolar hemiarthroplasty on 07-01-22. PMH: Alzheimer's dementia, vertigo, psoriasis, SVT, DM II, hemorrhoids, diverticulosis, bradycardia, insomnoa, basal cell carcinoma, Parkinson's    PT Comments    Pt seen POD3 fo the procedure above. Pt required maxA+2 for supine to sit, pt sat EOB with min guard, attempted STS with STEADY x5 with maxA+2, pt with heavy posterior lean, pushing posterior, crying out, unable to achieve full standing despite PT and family max encouragement. Further attempts deferred as pt demonstrating non-verbal signs of fatigue, pt returned to supine total assist and repositioned on R semi-sidelying with pillow behind back to change loading, pillow between knees as well to maintain neutral hip positioning. Pt would benefit from continued skilled therapy in the acute setting and we will continue to follow.     Recommendations for follow up therapy are one component of a multi-disciplinary discharge planning process, led by the attending physician.  Recommendations may be updated based on patient status, additional functional criteria and insurance authorization.  Follow Up Recommendations  Can patient physically be transported by private vehicle: No    Assistance Recommended at Discharge Frequent or constant Supervision/Assistance  Patient can return home with the following Two people to help with walking and/or transfers;Two people to help with bathing/dressing/bathroom;Assist for transportation;Assistance with feeding;Help with  stairs or ramp for entrance;Direct supervision/assist for financial management;Direct supervision/assist for medications management;Assistance with cooking/housework   Equipment Recommendations  None recommended by PT    Recommendations for Other Services       Precautions / Restrictions Precautions Precautions: Posterior Hip Precaution Booklet Issued: Yes (comment) Precaution Comments: sign hung on door and on white board, reviewed precautions with pt's wife Restrictions Weight Bearing Restrictions: No LLE Weight Bearing: Weight bearing as tolerated     Mobility  Bed Mobility Overal bed mobility: Needs Assistance Bed Mobility: Supine to Sit, Sit to Supine, Rolling Rolling: Max assist, +2 for physical assistance   Supine to sit: +2 for physical assistance, Max assist, HOB elevated Sit to supine: Total assist, +2 for physical assistance   General bed mobility comments: max assist +2 for trunk elevation and bringing BLE off bed, pt crying out in pain once LLE off bed. Pt sat EOB without need for assist other than min guard ~10min.    Transfers Overall transfer level: Needs assistance Equipment used: Ambulation equipment used Transfers: Sit to/from Stand Sit to Stand: From elevated surface, +2 physical assistance, Total assist           General transfer comment: Attempted 5 STS transfers from elevated EOB with STEDY, pt with STRONG posterior lean and crying out, likely confused, unable to acheive enough lift of buttocks to place STEDY pads underneath. Further attempts discontinued as by rep 5 pt with eyes closed and reduced vocalization. Returned to supine positioning total assist. Transfer via Lift Equipment: Stedy  Ambulation/Gait               General Gait Details: unsafe   Financial trader  Mobility    Modified Rankin (Stroke Patients Only)       Balance Overall balance assessment: Needs assistance Sitting-balance support: Feet  supported, No upper extremity supported Sitting balance-Leahy Scale: Fair Sitting balance - Comments: intially required increased assist, however progressed to brief instances of SBA to min guard   Standing balance support: Bilateral upper extremity supported Standing balance-Leahy Scale: Zero                              Cognition Arousal/Alertness: Awake/alert Behavior During Therapy: Flat affect Overall Cognitive Status: History of cognitive impairments - at baseline Area of Impairment: Attention, Memory, Safety/judgement, Awareness, Orientation, Following commands                 Orientation Level: Place, Time, Situation, Disoriented to   Memory: Decreased short-term memory, Decreased recall of precautions Following Commands: Follows one step commands inconsistently Safety/Judgement: Decreased awareness of safety, Decreased awareness of deficits              Exercises      General Comments        Pertinent Vitals/Pain Pain Assessment Pain Assessment: Faces Faces Pain Scale: Hurts whole lot Breathing: normal Negative Vocalization: none Facial Expression: smiling or inexpressive Body Language: relaxed Consolability: no need to console PAINAD Score: 0 Pain Descriptors / Indicators: Operative site guarding Pain Intervention(s): Limited activity within patient's tolerance, Monitored during session, Repositioned    Home Living                          Prior Function            PT Goals (current goals can now be found in the care plan section) Acute Rehab PT Goals Patient Stated Goal: to to ST-SNF for rehab PT Goal Formulation: With family Time For Goal Achievement: 07/16/22 Potential to Achieve Goals: Fair Progress towards PT goals: Not progressing toward goals - comment (Pt unable to acheive standing)    Frequency    Min 1X/week      PT Plan Current plan remains appropriate    Co-evaluation              AM-PAC  PT "6 Clicks" Mobility   Outcome Measure  Help needed turning from your back to your side while in a flat bed without using bedrails?: Total Help needed moving from lying on your back to sitting on the side of a flat bed without using bedrails?: Total Help needed moving to and from a bed to a chair (including a wheelchair)?: Total Help needed standing up from a chair using your arms (e.g., wheelchair or bedside chair)?: Total Help needed to walk in hospital room?: Total Help needed climbing 3-5 steps with a railing? : Total 6 Click Score: 6    End of Session Equipment Utilized During Treatment: Gait belt Activity Tolerance: Patient limited by pain;Patient limited by fatigue Patient left: with call bell/phone within reach;with family/visitor present;in bed;with bed alarm set Nurse Communication: Mobility status PT Visit Diagnosis: Difficulty in walking, not elsewhere classified (R26.2);Pain;Muscle weakness (generalized) (M62.81);Unsteadiness on feet (R26.81);Other abnormalities of gait and mobility (R26.89) Pain - Right/Left: Left Pain - part of body: Hip     Time: 0981-1914 PT Time Calculation (min) (ACUTE ONLY): 22 min  Charges:  $Therapeutic Activity: 8-22 mins  Jamesetta Geralds, PT, DPT [facility] Rehabilitation Department Office: [phone] Weekend pager: [phone]   Jamesetta Geralds 07/04/2022, 2:08 PM

## 2022-07-04 NOTE — Progress Notes (Signed)
The patient pulled out his IV and partially removed the surgical dressing. Consulted with charge RN and agree to replace it with a new dressing. Skin is clean dry and intact. The patient is only alert and oriented to himself at the moment. Hand mittens have been applied. On call provider has been notified.

## 2022-07-05 DIAGNOSIS — S7292XA Unspecified fracture of left femur, initial encounter for closed fracture: Secondary | ICD-10-CM | POA: Diagnosis not present

## 2022-07-05 LAB — GLUCOSE, CAPILLARY
Glucose-Capillary: 143 mg/dL — ABNORMAL HIGH (ref 70–99)
Glucose-Capillary: 141 mg/dL — ABNORMAL HIGH (ref 70–99)
Glucose-Capillary: 151 mg/dL — ABNORMAL HIGH (ref 70–99)
Glucose-Capillary: 159 mg/dL — ABNORMAL HIGH (ref 70–99)

## 2022-07-05 NOTE — Progress Notes (Signed)
PROGRESS NOTE    Joe Murray  ZOX:[id number] DOB: [birth year] DOA: 06/29/2022 PCP: Nelwyn Salisbury, MD    Brief Narrative:   Joe Murray is a 82 y.o. male with past medical history significant for dementia, Parkinson's disease, CKD stage IIIa, non-insulin-dependent DM 2, HTN, CAD (PCI 02/2015 with DES to RCA and mid-LAD), GERD and recent hospitalization for left femoral neck fracture status post percutaneous pinning by Dr. Blanchie Dessert 05/27/2022 who presented to [facility] ED on 5/6 with left hip pain and inability to ambulate.  Following his recent surgery, patient was discharged to [facility] for rehabilitation.  Of note, family reports declining cognition since April.  Patient unable to provide any further history due to his dementia.  In the ED, temperature 98.9 F, HR 65, RR 26, BP 139/67, SpO2 96% on room air.  WBC 16.2, hemoglobin 13.0, platelets 270.  Sodium 138, potassium 4.3, chloride 104, CO2 25, glucose 25, BUN 28, creatinine 1.26.  AST 35, ALT 16, total bilirubin 0.7.  Imaging notable for shortening of the left femoral neck with impaction.  Orthopedics was consulted and [facility] consulted for admission for further evaluation and management.  Assessment & Plan:   Left femoral neck fracture Patient with increased left hip pain over the previous 4 days prior to admission with inability to ambulate.  Underwent recent percutaneous pinning by Dr. Blanchie Dessert on 4/3 for left femoral neck fracture.  Imaging notable for left femoral head fracture with nonunion.  Patient's family initially apprehensive about undergoing repair but after discussions with orthopedics they were agreeable to proceed and patient underwent removal of cannulated screws and conversion to left hip bipolar hemiarthroplasty by Dr. Blanchie Dessert on 07/01/2022.  Vitamin D 25 hydroxy level 45.21, within normal limits. -- Orthopedics following, appreciate assistance -- Completed Ancef x 3 days, now transitioned to  cefadroxil 500mg  BID x 7 days per orthoipedics -- WBAT with posterior hip precautions x 6 weeks -- Lovenox for postoperative DVT prophylaxis x 4 weeks per orthopedics -- Tylenol 650 mg p.o. BID -- Tramadol 50 mg p.o. every 6 hours as needed moderate/severe pain -- Dilaudid 0.5-1 mg IV every 4 hours as needed severe pain not relieved with oral medication -- Robaxin 500 mg p.o./IV every 6 hours.  Muscle spasms -- PT/OT recommending SNF, TOC for placement -- Follow-up orthopedics 2 weeks for wound check -- Plan discharge to [facility] SNF tomorrow  Hypokalemia Repleted.  Essential hypertension CAD Currently not on antihypertensives outpatient. -- Hydralazine 5 mg IV q6h PRN SBP >165  Type 2 diabetes mellitus Hemoglobin A1c 7.2.  Home medications include glipizide 5 mg p.o. twice daily. -- Hold oral hypoglycemics while inpatient -- SSI for coverage -- CBGs qAC/HS  Left heel pressure wound, POA Pressure Injury 06/30/22 Heel Left Unstageable - Full thickness tissue loss in which the base of the injury is covered by slough (yellow, tan, gray, green or brown) and/or eschar (tan, brown or black) in the wound bed. (Active)  06/30/22 0715  Location: Heel  Location Orientation: Left  Staging: Unstageable - Full thickness tissue loss in which the base of the injury is covered by slough (yellow, tan, gray, green or brown) and/or eschar (tan, brown or black) in the wound bed.  Wound Description (Comments):   Present on Admission: Yes     Pressure Injury 06/30/22 Foot Anterior;Left;Other (Comment) Stage 2 -  Partial thickness loss of dermis presenting as a shallow open injury with a red, pink wound bed without slough. (Active)  06/30/22 0715  Location: Foot  Location Orientation: Anterior;Left;Other (Comment) (inner)  Staging: Stage 2 -  Partial thickness loss of dermis presenting as a shallow open injury with a red, pink wound bed without slough.  Wound Description (Comments):   Present on  Admission: Yes  -- Seen by wound care RN -- Paint left heel ulcer with betadine daily, allow to air dry -- Offload heel at all times with Prevalon boot -- Silicone foam to the left ankle wound   Parkinson disease/dementia -- Namenda 10 mg p.o. twice daily -- Sinemet 3 times daily -- Seroquel 75 mg p.o. nightly -- Melatonin 3 mg p.o. nightly    DVT prophylaxis: enoxaparin (LOVENOX) injection 40 mg Start: 07/02/22 0800 SCDs Start: 07/01/22 1935 Place TED hose Start: 07/01/22 1935 SCDs Start: 06/29/22 1210    Code Status: DNR Family Communication: No family present at bedside this morning, updated multiple family members at bedside yesterday afternoon  Disposition Plan:  Level of care: Med-Surg Status is: Inpatient Remains inpatient appropriate because: Plan discharge to [facility] SNF tomorrow per [facility]    Consultants:  Orthopedics, Dr. Blanchie Dessert Palliative care  Procedures:  Removal of cannulated screws and conversion to left hip bipolar hemiarthroplasty by Dr. Blanchie Dessert on 07/01/2022  Antimicrobials:  Cefazolin 5/8 - 5/10 Cefadroxil 5/11>>   Subjective: Seen and examined at bedside, alert, pleasantly confused.  Denies pain.  No family present this morning.  Unable to obtain any further ROS from patient due to his underlying dementia.  Patient pulled out his IV and removed partial surgical dressing overnight, otherwise no acute concerns overnight per nursing staff.  Medically stable for discharge to SNF, bed available on tomorrow at [facility].  Objective: Vitals:   07/04/22 1329 07/04/22 1404 07/04/22 2203 07/05/22 0646  BP: 133/66  124/82 (!) 159/75  Pulse: (!) 56 60 71 (!) 56  Resp: 17  16 18   Temp: 98.2 F (36.8 C)  98.6 F (37 C) 98.5 F (36.9 C)  TempSrc:    Oral  SpO2:   96% 98%  Weight:      Height:        Intake/Output Summary (Last 24 hours) at 07/05/2022 1218 Last data filed at 07/05/2022 0900 Gross per 24 hour  Intake 530.56 ml  Output 650 ml   Net -119.44 ml   Filed Weights   06/29/22 0855 06/30/22 1134  Weight: 98.5 kg 85.9 kg    Examination:  Physical Exam: GEN: NAD, alert, pleasantly confused, elderly/chronically ill in appearance HEENT: NCAT, PERRL, EOMI, sclera clear, MMM PULM: CTAB w/o wheezes/crackles, normal respiratory effort, on room air CV: RRR w/o M/G/R GI: abd soft, NTND, NABS, no R/G/M MSK: no peripheral edema, moving all extremities independently, surgical dressing noted to left hip without surrounding fluctuance/erythema or significant ecchymosis, clean/dry/intact, left heel wound noted as below      Data Reviewed: I have personally reviewed following labs and imaging studies  CBC: Recent Labs  Lab 06/29/22 1004 07/01/22 0358 07/02/22 0355 07/03/22 0353  WBC 16.2* 13.6* 10.8* 9.6  NEUTROABS 13.2* 10.5*  --   --   HGB 13.0 11.6* 11.0* 11.1*  HCT 40.8 36.8* 34.6* 34.8*  MCV 96.0 99.2 96.6 98.3  PLT 270 262 249 286   Basic Metabolic Panel: Recent Labs  Lab 06/29/22 1004 07/01/22 0358 07/02/22 0355 07/03/22 0353 07/04/22 0353  NA 138 142 138 136 139  K 4.3 3.9 4.5 3.3* 3.9  CL 104 109 105 104 103  CO2 25 25 23  22  26  GLUCOSE 185* 128* 223* 138* 160*  BUN 28* 24* 29* 26* 13  CREATININE 1.26* 1.13 1.12 1.14 0.97  CALCIUM 8.9 9.1 8.7* 8.3* 8.5*  MG 2.1 2.0 2.0  --  1.8   GFR: Estimated Creatinine Clearance: 62.6 mL/min (by C-G formula based on SCr of 0.97 mg/dL). Liver Function Tests: Recent Labs  Lab 06/29/22 1004 07/01/22 0358  AST 35 39  ALT 16 7  ALKPHOS 92 106  BILITOT 0.7 0.8  PROT 7.0 6.4*  ALBUMIN 3.2* 2.5*   No results for input(s): "LIPASE", "AMYLASE" in the last 168 hours. Recent Labs  Lab 06/30/22 1033  AMMONIA 23   Coagulation Profile: Recent Labs  Lab 07/01/22 0358  INR 1.1   Cardiac Enzymes: No results for input(s): "CKTOTAL", "CKMB", "CKMBINDEX", "TROPONINI" in the last 168 hours. BNP (last 3 results) No results for input(s): "PROBNP" in the last  8760 hours. HbA1C: No results for input(s): "HGBA1C" in the last 72 hours.  CBG: Recent Labs  Lab 07/04/22 1134 07/04/22 1636 07/04/22 2159 07/05/22 0810 07/05/22 1210  GLUCAP 160* 192* 143* 143* 141*   Lipid Profile: No results for input(s): "CHOL", "HDL", "LDLCALC", "TRIG", "CHOLHDL", "LDLDIRECT" in the last 72 hours. Thyroid Function Tests: No results for input(s): "TSH", "T4TOTAL", "FREET4", "T3FREE", "THYROIDAB" in the last 72 hours.  Anemia Panel: No results for input(s): "VITAMINB12", "FOLATE", "FERRITIN", "TIBC", "IRON", "RETICCTPCT" in the last 72 hours.  Sepsis Labs: No results for input(s): "PROCALCITON", "LATICACIDVEN" in the last 168 hours.  Recent Results (from the past 240 hour(s))  Surgical PCR screen     Status: None   Collection Time: 06/30/22  9:46 PM   Specimen: Nasal Mucosa; Nasal Swab  Result Value Ref Range Status   MRSA, PCR NEGATIVE NEGATIVE Final   Staphylococcus aureus NEGATIVE NEGATIVE Final    Comment: (NOTE) The Xpert SA Assay (FDA approved for NASAL specimens in patients 70 years of age and older), is one component of a comprehensive surveillance program. It is not intended to diagnose infection nor to guide or monitor treatment. Performed at [facility], [address], [place], [place] [address]          Radiology Studies: No results found.      Scheduled Meds:  acetaminophen  650 mg Oral BID   carbidopa-levodopa  1 tablet Oral TID   cefadroxil  500 mg Oral BID   docusate sodium  100 mg Oral BID   enoxaparin (LOVENOX) injection  40 mg Subcutaneous Q24H   feeding supplement  237 mL Oral BID BM   insulin aspart  0-9 Units Subcutaneous TID WC   magnesium chloride  256 mg Oral Daily   melatonin  3 mg Oral QHS   memantine  10 mg Oral BID   prednisoLONE acetate  1 drop Right Eye Daily   QUEtiapine  75 mg Oral QHS   senna  1 tablet Oral BID   Continuous Infusions:  methocarbamol (ROBAXIN) IV        LOS: 6 days    Time spent: 51 minutes spent on chart review, discussion with nursing staff, consultants, updating family and interview/physical exam; more than 50% of that time was spent in counseling and/or coordination of care.    Alvira Philips [place], DO [facility] Available via Epic secure chat 7am-7pm After these hours, please refer to coverage provider listed on [email or web address] 07/05/2022, 12:18 PM

## 2022-07-06 DIAGNOSIS — F0282 Dementia in other diseases classified elsewhere, unspecified severity, with psychotic disturbance: Secondary | ICD-10-CM | POA: Diagnosis not present

## 2022-07-06 DIAGNOSIS — G20A1 Parkinson's disease without dyskinesia, without mention of fluctuations: Secondary | ICD-10-CM | POA: Diagnosis not present

## 2022-07-06 DIAGNOSIS — T84196D Other mechanical complication of internal fixation device of bone of right lower leg, subsequent encounter: Secondary | ICD-10-CM | POA: Diagnosis not present

## 2022-07-06 DIAGNOSIS — F0280 Dementia in other diseases classified elsewhere without behavioral disturbance: Secondary | ICD-10-CM | POA: Diagnosis not present

## 2022-07-06 DIAGNOSIS — I739 Peripheral vascular disease, unspecified: Secondary | ICD-10-CM | POA: Diagnosis not present

## 2022-07-06 DIAGNOSIS — L89620 Pressure ulcer of left heel, unstageable: Secondary | ICD-10-CM | POA: Diagnosis not present

## 2022-07-06 DIAGNOSIS — Z515 Encounter for palliative care: Secondary | ICD-10-CM | POA: Diagnosis not present

## 2022-07-06 DIAGNOSIS — G20B2 Parkinson's disease with dyskinesia, with fluctuations: Secondary | ICD-10-CM | POA: Diagnosis not present

## 2022-07-06 DIAGNOSIS — M25552 Pain in left hip: Secondary | ICD-10-CM | POA: Diagnosis not present

## 2022-07-06 DIAGNOSIS — T84195D Other mechanical complication of internal fixation device of left femur, subsequent encounter: Secondary | ICD-10-CM | POA: Diagnosis not present

## 2022-07-06 DIAGNOSIS — N390 Urinary tract infection, site not specified: Secondary | ICD-10-CM | POA: Diagnosis not present

## 2022-07-06 DIAGNOSIS — L89890 Pressure ulcer of other site, unstageable: Secondary | ICD-10-CM | POA: Diagnosis not present

## 2022-07-06 DIAGNOSIS — M6281 Muscle weakness (generalized): Secondary | ICD-10-CM | POA: Diagnosis not present

## 2022-07-06 DIAGNOSIS — F0394 Unspecified dementia, unspecified severity, with anxiety: Secondary | ICD-10-CM | POA: Diagnosis not present

## 2022-07-06 DIAGNOSIS — F02818 Dementia in other diseases classified elsewhere, unspecified severity, with other behavioral disturbance: Secondary | ICD-10-CM | POA: Diagnosis not present

## 2022-07-06 DIAGNOSIS — M19072 Primary osteoarthritis, left ankle and foot: Secondary | ICD-10-CM | POA: Diagnosis not present

## 2022-07-06 DIAGNOSIS — R41841 Cognitive communication deficit: Secondary | ICD-10-CM | POA: Diagnosis not present

## 2022-07-06 DIAGNOSIS — Z743 Need for continuous supervision: Secondary | ICD-10-CM | POA: Diagnosis not present

## 2022-07-06 DIAGNOSIS — L89896 Pressure-induced deep tissue damage of other site: Secondary | ICD-10-CM | POA: Diagnosis not present

## 2022-07-06 DIAGNOSIS — G301 Alzheimer's disease with late onset: Secondary | ICD-10-CM | POA: Diagnosis not present

## 2022-07-06 DIAGNOSIS — E782 Mixed hyperlipidemia: Secondary | ICD-10-CM | POA: Diagnosis not present

## 2022-07-06 DIAGNOSIS — G8911 Acute pain due to trauma: Secondary | ICD-10-CM | POA: Diagnosis not present

## 2022-07-06 DIAGNOSIS — S72042D Displaced fracture of base of neck of left femur, subsequent encounter for closed fracture with routine healing: Secondary | ICD-10-CM | POA: Diagnosis not present

## 2022-07-06 DIAGNOSIS — N1831 Chronic kidney disease, stage 3a: Secondary | ICD-10-CM | POA: Diagnosis not present

## 2022-07-06 DIAGNOSIS — I10 Essential (primary) hypertension: Secondary | ICD-10-CM | POA: Diagnosis not present

## 2022-07-06 DIAGNOSIS — R1311 Dysphagia, oral phase: Secondary | ICD-10-CM | POA: Diagnosis not present

## 2022-07-06 DIAGNOSIS — S7292XA Unspecified fracture of left femur, initial encounter for closed fracture: Secondary | ICD-10-CM | POA: Diagnosis not present

## 2022-07-06 DIAGNOSIS — R2689 Other abnormalities of gait and mobility: Secondary | ICD-10-CM | POA: Diagnosis not present

## 2022-07-06 DIAGNOSIS — S72002K Fracture of unspecified part of neck of left femur, subsequent encounter for closed fracture with nonunion: Secondary | ICD-10-CM | POA: Diagnosis not present

## 2022-07-06 DIAGNOSIS — Z7401 Bed confinement status: Secondary | ICD-10-CM | POA: Diagnosis not present

## 2022-07-06 DIAGNOSIS — S7292XK Unspecified fracture of left femur, subsequent encounter for closed fracture with nonunion: Secondary | ICD-10-CM | POA: Diagnosis not present

## 2022-07-06 DIAGNOSIS — E1169 Type 2 diabetes mellitus with other specified complication: Secondary | ICD-10-CM | POA: Diagnosis not present

## 2022-07-06 DIAGNOSIS — E538 Deficiency of other specified B group vitamins: Secondary | ICD-10-CM | POA: Diagnosis not present

## 2022-07-06 DIAGNOSIS — K219 Gastro-esophageal reflux disease without esophagitis: Secondary | ICD-10-CM | POA: Diagnosis not present

## 2022-07-06 LAB — GLUCOSE, CAPILLARY
Glucose-Capillary: 137 mg/dL — ABNORMAL HIGH (ref 70–99)
Glucose-Capillary: 177 mg/dL — ABNORMAL HIGH (ref 70–99)

## 2022-07-06 MED ORDER — QUETIAPINE FUMARATE 25 MG PO TABS: 75.0000 mg | ORAL_TABLET | Freq: Every day | ORAL | Status: AC

## 2022-07-06 MED ORDER — POLYETHYLENE GLYCOL 3350 17 G PO PACK: 17.0000 g | PACK | Freq: Every day | ORAL | 0 refills | Status: AC | PRN

## 2022-07-06 NOTE — Progress Notes (Signed)
     Subjective: Patient lying comfortably in bed.  No family at bedside this morning.  Per his nurse, Onalee Hua, slept great overnight with no issues.  Reportedly was also very lucid and doing well yesterday when family was around.  No concerns.  Plan for discharge to [facility] today.  Objective:   VITALS:   Vitals:   07/05/22 0646 07/05/22 1342 07/05/22 2042 07/06/22 0601  BP: (!) 159/75 122/65 (!) 147/84 (!) 152/67  Pulse: (!) 56 (!) 52 (!) 56 (!) 52  Resp: 18 16 18 17   Temp: 98.5 F (36.9 C) 97.7 F (36.5 C) 98.1 F (36.7 C) 98.3 F (36.8 C)  TempSrc: Oral Oral Oral Oral  SpO2: 98% 97% 100% 97%  Weight:      Height:        Intact pulses distally Incision: dressing C/D/I Compartment soft Patient does not cooperative motor or sensory exam due to baseline dementia  Lab Results  Component Value Date   WBC 9.6 07/03/2022   HGB 11.1 (L) 07/03/2022   HCT 34.8 (L) 07/03/2022   MCV 98.3 07/03/2022   PLT 286 07/03/2022   BMET    Component Value Date/Time   NA 139 07/04/2022 0353   NA 142 06/01/2019 0824   K 3.9 07/04/2022 0353   CL 103 07/04/2022 0353   CO2 26 07/04/2022 0353   GLUCOSE 160 (H) 07/04/2022 0353   BUN 13 07/04/2022 0353   BUN 22 06/01/2019 0824   CREATININE 0.97 07/04/2022 0353   CREATININE 1.26 (H) 11/07/2019 1048   CALCIUM 8.5 (L) 07/04/2022 0353   GFRNONAA >60 07/04/2022 0353      Xray: PACU x-rays demonstrated cemented hip arthroplasty without adverse features  Assessment/Plan: 5 Days Post-Op   Principal Problem:   Fracture of left femur with nonunion Active Problems:   GERD   Type 2 diabetes mellitus with stage 3a chronic kidney disease, without long-term current use of insulin (HCC)   Coronary artery disease involving native coronary artery of native heart without angina pectoris   Parkinson's disease   Alzheimer's dementia (HCC)   Chronic kidney disease, stage 3a (HCC)   Unstageable pressure ulcer of left heel (HCC)   Mixed diabetic  hyperlipidemia associated with type 2 diabetes mellitus (HCC)   Pressure injury of left ankle, stage 2 (HCC)   Goals of care, counseling/discussion   Folate deficiency   Acute postoperative pain of left hip   Need for emotional support   Counseling and coordination of care   Palliative care encounter  S/p L hip cannulated screw removal conversion to hip hemiarthroplasty   Post op recs: WB: WBAT with posterior hip precautions x6 weeks Abx: Continue Ancef x 3 days, discharge cefadroxil 500 twice daily x7 days given increased risk of infection due to multiple recent surgeries Imaging: PACU xrays Dressing: Aquacel dressing to be kept intact until follow-up DVT prophylaxis: lovenox starting POD1 x4 weeks Follow up: 2 weeks after surgery for a wound check with Dr. Blanchie Dessert at [facility].  Address: [address], [place], [place] [address]  Office Phone: [phone]    Joe Murray 07/06/2022, 7:01 AM   Weber Cooks, MD  Contact information:   [phone] 7am-5pm epic message Dr. Blanchie Dessert, or call office for patient follow up: [phone] After hours and holidays please check [email or web address] for group call information for [facility]

## 2022-07-06 NOTE — Consult Note (Signed)
   [facility] Inpatient Consult   07/06/2022  Joe Murray 1940/04/22 [id number]  [facility] [[facility]]  Accountable Care Organization [ACO] Patient: Joe Murray  Primary Care Provider:  Nelwyn Salisbury, MD with [place] [facility] [place] which is listed to provide the transition of care follow up   Patient was reviewed for readmission with high risk score for unplanned readmission risk length of stay and barriers to community care.  Patient is being recommended to return to a skilled nursing facility for SNF rehab.  If the patient goes to a [facility] affiliated facility then, patient can be followed by [facility] [[facility]] Care Management PAC RN with traditional Medicare and approved Medicare Advantage plans.    Plan:  Can notify [facility] RN who can follow for any known or needs for transitional care needs for returning to post facility care coordination needs to return to community.  For questions or referrals, please contact:   Charlesetta Shanks, RN BSN CCM [facility]  [phone] business mobile phone Toll free office [phone]  *Concierge Line  [phone] Fax number: [phone] [email or web address]

## 2022-07-06 NOTE — TOC Transition Note (Signed)
Transition of Care [facility]) - CM/SW Discharge Note  Patient Details  Name: Joe Murray MRN: [id number] Date of Birth: [birth year]  Transition of Care [facility] CM/SW Contact:  Ewing Schlein, LCSW Phone Number: 07/06/2022, 9:34 AM  Clinical Narrative: CSW confirmed bed availability for today with [place] in admissions at [facility]. Patient will go to room 407P and the number for report is [phone]. Discharge summary, discharge orders, and SNF transfer report faxed to facility in hub. Medical necessity form done; PTAR scheduled. Wife notified of discharge and bed availability. Discharge packet completed. RN updated. TOC signing off.  Final next level of care: Skilled Nursing Facility Barriers to Discharge: Barriers Resolved  Patient Goals and CMS Choice CMS Medicare.gov Compare Post Acute Care list provided to:: Patient Represenative (must comment) Caylob Chavana (spouse)) Choice offered to / list presented to : Spouse  Discharge Placement Existing PASRR number confirmed : 07/03/22          Patient chooses bed at: [facility] Patient to be transferred to facility by: PTAR Name of family member notified: Piere Sumsion (spouse) Patient and family notified of of transfer: 07/06/22  Discharge Plan and Services Additional resources added to the After Visit Summary for   In-house Referral: Clinical Social Work   DME Arranged: N/A DME Agency: NA  Social Determinants of Health (SDOH) Interventions SDOH Screenings   Food Insecurity: No Food Insecurity (06/30/2022)  Housing: Low Risk  (06/30/2022)  Transportation Needs: No Transportation Needs (06/30/2022)  Utilities: Not At Risk (06/30/2022)  Alcohol Screen: Low Risk  (03/30/2022)  Depression (PHQ2-9): Low Risk  (03/30/2022)  Financial Resource Strain: Low Risk  (03/30/2022)  Recent Concern: Financial Resource Strain - Medium Risk (01/06/2022)  Physical Activity: Inactive (03/30/2022)  Social Connections: Socially Integrated (03/30/2022)  Stress:  No Stress Concern Present (03/30/2022)  Tobacco Use: High Risk (07/03/2022)   Readmission Risk Interventions    06/01/2022   11:19 AM  Readmission Risk Prevention Plan  Post Dischage Appt Complete  Medication Screening Complete  Transportation Screening Complete

## 2022-07-06 NOTE — Discharge Summary (Signed)
Physician Discharge Summary  Joe Murray:[id number] DOB: 12/26/1940 DOA: 06/29/2022  PCP: Nelwyn Salisbury, MD  Admit date: 06/29/2022 Discharge date: 07/06/2022  Admitted From: [facility] Disposition:  [facility]  Recommendations for Outpatient Follow-up:  Follow up with PCP in 1-2 weeks Follow-up with orthopedics, Dr. Blanchie Dessert in 2 weeks for wound check Continue cefadroxil 500 mg p.o. twice daily to complete 7 days per orthopedics Lovenox for DVT prophylaxis x 4 weeks Recommend continue goals of care discussion outpatient given patient's comorbidities, advanced dementia and overall poor long-term prognosis  Discharge Condition: Stable CODE STATUS: DNR  Diet recommendation:  Regular;Thin liquid   Liquid Administration via: Cup;Straw Medication Administration: Other (Comment) (as tolerated) Compensations: Slow rate;Small sips/bites;Other (Comment) (pt to feed self as able) Postural Changes: Remain upright for at least 30 minutes after po intake;Seated upright at 90 degrees   History of present illness:  Joe Murray is a 82 y.o. male with past medical history significant for dementia, Parkinson's disease, CKD stage IIIa, non-insulin-dependent DM 2, HTN, CAD (PCI 02/2015 with DES to RCA and mid-LAD), GERD and recent hospitalization for left femoral neck fracture status post percutaneous pinning by Dr. Blanchie Dessert 05/27/2022 who presented to [facility] ED on 5/6 with left hip pain and inability to ambulate.  Following his recent surgery, patient was discharged to [facility] for rehabilitation.  Of note, family reports declining cognition since April.  Patient unable to provide any further history due to his dementia.   In the ED, temperature 98.9 F, HR 65, RR 26, BP 139/67, SpO2 96% on room air.  WBC 16.2, hemoglobin 13.0, platelets 270.  Sodium 138, potassium 4.3, chloride 104, CO2 25, glucose 25, BUN 28, creatinine 1.26.  AST 35, ALT 16, total bilirubin  0.7.  Imaging notable for shortening of the left femoral neck with impaction.  Orthopedics was consulted and TRH consulted for admission for further evaluation and management.  Hospital course:  Left femoral neck fracture Patient with increased left hip pain over the previous 4 days prior to admission with inability to ambulate.  Underwent recent percutaneous pinning by Dr. Blanchie Dessert on 4/3 for left femoral neck fracture.  Imaging notable for left femoral head fracture with nonunion.  Patient's family initially apprehensive about undergoing repair but after discussions with orthopedics they were agreeable to proceed and patient underwent removal of cannulated screws and conversion to left hip bipolar hemiarthroplasty by Dr. Blanchie Dessert on 07/01/2022.  Vitamin D 25 hydroxy level 45.21, within normal limits.  Completed Ancef x 3 days, now transitioned to cefadroxil 500mg  BID x 7 days per orthopedics. WBAT with posterior hip precautions x 6 weeks. Lovenox for postoperative DVT prophylaxis x 4 weeks per orthopedics. Follow-up orthopedics 2 weeks for wound check.  Discharging to SNF for further evaluation.   Hypokalemia Repleted.   Essential hypertension CAD Currently not on antihypertensives outpatient.   Type 2 diabetes mellitus Hemoglobin A1c 7.2.  Home medications include glipizide 5 mg p.o. twice daily.   Left heel pressure wound, POA Pressure Injury 06/30/22 Heel Left Unstageable - Full thickness tissue loss in which the base of the injury is covered by slough (yellow, tan, gray, green or brown) and/or eschar (tan, brown or black) in the wound bed. (Active)  06/30/22 0715  Location: Heel  Location Orientation: Left  Staging: Unstageable - Full thickness tissue loss in which the base of the injury is covered by slough (yellow, tan, gray, green or brown) and/or eschar (tan, brown or black)  in the wound bed.  Wound Description (Comments):   Present on Admission: Yes     Pressure Injury  06/30/22 Foot Anterior;Left;Other (Comment) Stage 2 -  Partial thickness loss of dermis presenting as a shallow open injury with a red, pink wound bed without slough. (Active)  06/30/22 0715  Location: Foot  Location Orientation: Anterior;Left;Other (Comment) (inner)  Staging: Stage 2 -  Partial thickness loss of dermis presenting as a shallow open injury with a red, pink wound bed without slough.  Wound Description (Comments):   Present on Admission: Yes  -- Seen by wound care RN -- Paint left heel ulcer with betadine daily, allow to air dry -- Offload heel at all times with Prevalon boot -- Silicone foam to the left ankle wound    Parkinson disease/dementia -- Namenda 10 mg p.o. twice daily -- Sinemet 3 times daily -- Seroquel 75 mg p.o. nightly -- Melatonin 3 mg p.o. nightly  Discharge Diagnoses:  Principal Problem:   Fracture of left femur with nonunion Active Problems:   Alzheimer's dementia (HCC)   Goals of care, counseling/discussion   Coronary artery disease involving native coronary artery of native heart without angina pectoris   Chronic kidney disease, stage 3a (HCC)   Parkinson's disease   Type 2 diabetes mellitus with stage 3a chronic kidney disease, without long-term current use of insulin (HCC)   Mixed diabetic hyperlipidemia associated with type 2 diabetes mellitus (HCC)   GERD   Unstageable pressure ulcer of left heel (HCC)   Pressure injury of left ankle, stage 2 (HCC)   Folate deficiency   Acute postoperative pain of left hip   Need for emotional support   Counseling and coordination of care   Palliative care encounter    Discharge Instructions  Discharge Instructions     Call MD for:  difficulty breathing, headache or visual disturbances   Complete by: As directed    Call MD for:  extreme fatigue   Complete by: As directed    Call MD for:  persistant dizziness or light-headedness   Complete by: As directed    Call MD for:  persistant nausea and  vomiting   Complete by: As directed    Call MD for:  severe uncontrolled pain   Complete by: As directed    Call MD for:  temperature >100.4   Complete by: As directed    Diet - low sodium heart healthy   Complete by: As directed    Discharge wound care:   Complete by: As directed    Silicone foam dressings to the left ankle wound, change every 3 days. Assess under dressings each shift for any acute changes in the wounds. Paint left heel wound daily with betadine daily, allow to air dry. Offload   Increase activity slowly   Complete by: As directed       Allergies as of 07/06/2022       Reactions   Beta Adrenergic Blockers Other (See Comments)   chronotropic incompetence   Praluent [alirocumab]    Diffuse Rash - improved when stopping & taking Benadryl   Sulfonamide Derivatives    unknown   Statins Other (See Comments)   REACTION: myalgias        Medication List     STOP taking these medications    amLODipine 2.5 MG tablet Commonly known as: NORVASC   LORazepam 0.5 MG tablet Commonly known as: ATIVAN   MAGNESIUM CHLORIDE PO   methocarbamol 500 MG tablet Commonly known as:  ROBAXIN   OneTouch Ultra test strip Generic drug: glucose blood   POTASSIUM GLUCONATE PO       TAKE these medications    acetaminophen 500 MG tablet Commonly known as: TYLENOL Take 2 tablets (1,000 mg total) by mouth 3 (three) times daily.   BD Lancet Ultrafine 30G Misc USE ONE  TO CHECK GLUCOSE ONCE DAILY   carbidopa-levodopa 25-100 MG tablet Commonly known as: SINEMET IR Take 1 tablet three times a day with meals   cefadroxil 500 MG capsule Commonly known as: DURICEF Take 1 capsule (500 mg total) by mouth 2 (two) times daily for 7 days.   cyanocobalamin 1000 MCG tablet Commonly known as: VITAMIN B12 Take 1,000 mcg by mouth daily.   enoxaparin 40 MG/0.4ML injection Commonly known as: LOVENOX Inject 0.4 mLs (40 mg total) into the skin daily for 28 days.   glipiZIDE 5 MG  tablet Commonly known as: GLUCOTROL Take 1 tablet (5 mg total) by mouth 2 (two) times daily before a meal.   HYDROcodone-acetaminophen 5-325 MG tablet Commonly known as: NORCO/VICODIN Take 1 tablet by mouth every 4 (four) hours as needed for up to 7 days for moderate pain.   melatonin 3 MG Tabs tablet Take 3 mg by mouth at bedtime.   memantine 10 MG tablet Commonly known as: NAMENDA Take 1 tablet by mouth twice daily   nitroGLYCERIN 0.4 MG SL tablet Commonly known as: NITROSTAT PLACE 1 TABLET UNDER THE TONGUE EVERY 5 MINUTES FOR 3 DOSES AS NEEDED FOR CHEST PAIN   polyethylene glycol 17 g packet Commonly known as: MIRALAX / GLYCOLAX Take 17 g by mouth daily as needed for mild constipation. What changed: reasons to take this   prednisoLONE acetate 1 % ophthalmic suspension Commonly known as: PRED FORTE Place 1 drop into the right eye daily.   QUEtiapine 25 MG tablet Commonly known as: SEROQUEL Take 3 tablets (75 mg total) by mouth at bedtime. What changed:  medication strength how much to take   Repatha Pushtronex System 420 MG/3.5ML Soct Generic drug: Evolocumab with Infusor Inject 3.5 mLs into the skin every 30 (thirty) days.   senna-docusate 8.6-50 MG tablet Commonly known as: Senokot-S Take 1 tablet by mouth 2 (two) times daily.               Discharge Care Instructions  (From admission, onward)           Start     Ordered   07/06/22 0000  Discharge wound care:       Comments: Silicone foam dressings to the left ankle wound, change every 3 days. Assess under dressings each shift for any acute changes in the wounds. Paint left heel wound daily with betadine daily, allow to air dry. Offload   07/06/22 1610            Follow-up Information     Joen Laura, MD Follow up in 2 week(s).   Specialty: Orthopedic Surgery Contact information: [address] [place] [address] [phone]         Nelwyn Salisbury, MD. Schedule  an appointment as soon as possible for a visit in 1 week(s).   Specialty: Family Medicine Contact information: [address] [place] [address] [phone]                Allergies  Allergen Reactions   Beta Adrenergic Blockers Other (See Comments)    chronotropic incompetence   Praluent [Alirocumab]  Diffuse Rash - improved when stopping & taking Benadryl   Sulfonamide Derivatives     unknown   Statins Other (See Comments)    REACTION: myalgias    Consultations: Orthopedics   Procedures/Studies: DG HIP UNILAT W OR W/O PELVIS 2-3 VIEWS LEFT  Result Date: 07/01/2022 CLINICAL DATA:  Postop left hip. EXAM: DG HIP (WITH OR WITHOUT PELVIS) 2-3V LEFT COMPARISON:  Radiograph 06/29/2022 FINDINGS: Left hip arthroplasty in expected alignment. No periprosthetic lucency or fracture. Recent postsurgical change includes air and edema in the soft tissues. IMPRESSION: Left hip arthroplasty without immediate postoperative complication. Electronically Signed   By: Narda Rutherford M.D.   On: 07/01/2022 15:19   EEG adult  Result Date: 06/30/2022 Charlsie Quest, MD     06/30/2022  5:20 PM Patient Name: Joe Murray MRN: [id number] Epilepsy Attending: Charlsie Quest Referring Physician/Provider: Marinda Elk, MD Date:  06/30/2022 Duration: 25.54 mins Patient history: 82yo M with ams getting eeg to evaluate for seizure Level of alertness: Awake AEDs during EEG study: None Technical aspects: This EEG study was done with scalp electrodes positioned according to the 10-20 International system of electrode placement. Electrical activity was reviewed with band pass filter of 1-70Hz , sensitivity of 7 uV/mm, display speed of 17mm/sec with a 60Hz  notched filter applied as appropriate. EEG data were recorded continuously and digitally stored.  Video monitoring was available and reviewed as appropriate. Description: EEG showed continuous generalized 3 to 6 Hz theta-delta slowing.   Hyperventilation and photic stimulation were not performed.   ABNORMALITY - Continuous slow, generalized IMPRESSION: This study is suggestive of moderate diffuse encephalopathy, nonspecific etiology. No seizures or epileptiform discharges were seen throughout the recording. Priyanka Annabelle Harman   CT HEAD WO CONTRAST ( )  Result Date: 06/30/2022 CLINICAL DATA:  Delirium. EXAM: CT HEAD WITHOUT CONTRAST TECHNIQUE: Contiguous axial images were obtained from the base of the skull through the vertex without intravenous contrast. RADIATION DOSE REDUCTION: This exam was performed according to the departmental dose-optimization program which includes automated exposure control, adjustment of the mA and/or kV according to patient size and/or use of iterative reconstruction technique. COMPARISON:  Head CT May 25, 2022. FINDINGS: Brain: No evidence of acute infarction, hemorrhage, hydrocephalus, extra-axial collection or mass lesion/mass effect. Patchy hypodensity of the periventricular white matter, nonspecific, most likely related to chronic microangiopathy. Moderate parenchymal volume loss. Vascular: No hyperdense vessel. Calcified plaques in the bilateral carotid siphons. Skull: Normal. Negative for fracture or focal lesion. Sinuses/Orbits: No acute finding.1 Other: None. IMPRESSION: 1. No acute intracranial abnormality. 2. Moderate parenchymal volume loss and chronic microangiopathy of the white matter. Electronically Signed   By: Baldemar Lenis M.D.   On: 06/30/2022 15:17   DG Abd 1 View  Result Date: 06/29/2022 CLINICAL DATA:  Pain over the last 4 days. History of hip surgery on 05/27/2022. EXAM: ABDOMEN - 1 VIEW COMPARISON:  None Available. FINDINGS: There is mild gaseous distention of the small and large bowel without convincing evidence of mechanical obstruction. There is no definite free intraperitoneal air, within the confines of supine technique. There is no gross organomegaly or abnormal soft  tissue calcification. Postsurgical changes are noted in the left hip. There is no evidence of acute osseous abnormality. IMPRESSION: Mild gaseous distention of the small and large bowel without convincing evidence of mechanical obstruction. Electronically Signed   By: Lesia Hausen M.D.   On: 06/29/2022 10:12   DG Hip Unilat W or Wo Pelvis 2-3 Views Left  Result Date: 06/29/2022 CLINICAL DATA:  Proximal femoral neck fracture, postoperative follow-up EXAM: DG HIP (WITH OR WITHOUT PELVIS) 2-3V LEFT COMPARISON:  05/27/2022 FINDINGS: Three left intertrochanteric fixation screws reduce the proximal femoral neck fracture. Exam is limited because of positioning. However, there is some degree of impaction/foreshortening of the left femoral neck compared to the right side. Fracture lines are not well demonstrated. IMPRESSION: 1. Left femoral neck fracture status post ORIF as above. 2. Limited exam because of positioning. Electronically Signed   By: Judie Petit.  Shick M.D.   On: 06/29/2022 10:12   DG Chest 1 View  Result Date: 06/29/2022 CLINICAL DATA:  Provided history: Pain. EXAM: CHEST  1 VIEW COMPARISON:  Chest radiographs 05/25/2022 and are FINDINGS: Shallow inspiration radiograph. Heart size at the upper limits of normal. No appreciable airspace consolidation or pulmonary edema. No evidence of pleural effusion or pneumothorax. No acute osseous abnormality identified. IMPRESSION: 1. Shallow inspiration radiograph. 2.  No evidence of acute cardiopulmonary abnormality. Electronically Signed   By: Jackey Loge D.O.   On: 06/29/2022 10:02     Subjective: Patient seen examined bedside, resting comfortably.  Lying in bed.  Pleasant confused.  Spouse present at bedside.  No specific complaints.  Discharging to SNF today for further rehabilitation.  Patient denies pain, otherwise unable to obtain any further ROS due to his underlying dementia.  No acute concerns overnight per nursing staff.  Discharge Exam: Vitals:    07/05/22 2042 07/06/22 0601  BP: (!) 147/84 (!) 152/67  Pulse: (!) 56 (!) 52  Resp: 18 17  Temp: 98.1 F (36.7 C) 98.3 F (36.8 C)  SpO2: 100% 97%   Vitals:   07/05/22 0646 07/05/22 1342 07/05/22 2042 07/06/22 0601  BP: (!) 159/75 122/65 (!) 147/84 (!) 152/67  Pulse: (!) 56 (!) 52 (!) 56 (!) 52  Resp: 18 16 18 17   Temp: 98.5 F (36.9 C) 97.7 F (36.5 C) 98.1 F (36.7 C) 98.3 F (36.8 C)  TempSrc: Oral Oral Oral Oral  SpO2: 98% 97% 100% 97%  Weight:      Height:        Physical Exam: GEN: NAD, alert, pleasantly confused, elderly/chronically ill in appearance HEENT: NCAT, PERRL, EOMI, sclera clear, MMM PULM: CTAB w/o wheezes/crackles, normal respiratory effort, on room air CV: RRR w/o M/G/R GI: abd soft, NTND, NABS, no R/G/M MSK: no peripheral edema, moving all extremities independently, surgical dressing noted to left hip without surrounding fluctuance/erythema or significant ecchymosis, clean/dry/intact, left heel wound noted as below    The results of significant diagnostics from this hospitalization (including imaging, microbiology, ancillary and laboratory) are listed below for reference.     Microbiology: Recent Results (from the past 240 hour(s))  Surgical PCR screen     Status: None   Collection Time: 06/30/22  9:46 PM   Specimen: Nasal Mucosa; Nasal Swab  Result Value Ref Range Status   MRSA, PCR NEGATIVE NEGATIVE Final   Staphylococcus aureus NEGATIVE NEGATIVE Final    Comment: (NOTE) The Xpert SA Assay (FDA approved for NASAL specimens in patients 37 years of age and older), is one component of a comprehensive surveillance program. It is not intended to diagnose infection nor to guide or monitor treatment. Performed at [facility], [address] [place], [place] [address]      Labs: BNP (last 3 results) No results for input(s): "BNP" in the last 8760 hours. Basic Metabolic Panel: Recent Labs  Lab 06/29/22 1004 07/01/22 0358  07/02/22 0355  07/03/22 0353 07/04/22 0353  NA 138 142 138 136 139  K 4.3 3.9 4.5 3.3* 3.9  CL 104 109 105 104 103  CO2 25 25 23 22 26   GLUCOSE 185* 128* 223* 138* 160*  BUN 28* 24* 29* 26* 13  CREATININE 1.26* 1.13 1.12 1.14 0.97  CALCIUM 8.9 9.1 8.7* 8.3* 8.5*  MG 2.1 2.0 2.0  --  1.8   Liver Function Tests: Recent Labs  Lab 06/29/22 1004 07/01/22 0358  AST 35 39  ALT 16 7  ALKPHOS 92 106  BILITOT 0.7 0.8  PROT 7.0 6.4*  ALBUMIN 3.2* 2.5*   No results for input(s): "LIPASE", "AMYLASE" in the last 168 hours. Recent Labs  Lab 06/30/22 1033  AMMONIA 23   CBC: Recent Labs  Lab 06/29/22 1004 07/01/22 0358 07/02/22 0355 07/03/22 0353  WBC 16.2* 13.6* 10.8* 9.6  NEUTROABS 13.2* 10.5*  --   --   HGB 13.0 11.6* 11.0* 11.1*  HCT 40.8 36.8* 34.6* 34.8*  MCV 96.0 99.2 96.6 98.3  PLT 270 262 249 286   Cardiac Enzymes: No results for input(s): "CKTOTAL", "CKMB", "CKMBINDEX", "TROPONINI" in the last 168 hours. BNP: Invalid input(s): "POCBNP" CBG: Recent Labs  Lab 07/05/22 0810 07/05/22 1210 07/05/22 1643 07/05/22 2108 07/06/22 0718  GLUCAP 143* 141* 151* 159* 137*   D-Dimer No results for input(s): "DDIMER" in the last 72 hours. Hgb A1c No results for input(s): "HGBA1C" in the last 72 hours. Lipid Profile No results for input(s): "CHOL", "HDL", "LDLCALC", "TRIG", "CHOLHDL", "LDLDIRECT" in the last 72 hours. Thyroid function studies No results for input(s): "TSH", "T4TOTAL", "T3FREE", "THYROIDAB" in the last 72 hours.  Invalid input(s): "FREET3" Anemia work up No results for input(s): "VITAMINB12", "FOLATE", "FERRITIN", "TIBC", "IRON", "RETICCTPCT" in the last 72 hours. Urinalysis    Component Value Date/Time   COLORURINE YELLOW 06/29/2022 1114   APPEARANCEUR CLEAR 06/29/2022 1114   LABSPEC 1.030 06/29/2022 1114   PHURINE 5.0 06/29/2022 1114   GLUCOSEU NEGATIVE 06/29/2022 1114   HGBUR MODERATE (A) 06/29/2022 1114   HGBUR trace-lysed 07/27/2007 1107    BILIRUBINUR NEGATIVE 06/29/2022 1114   BILIRUBINUR Negative 02/06/2022 1647   KETONESUR NEGATIVE 06/29/2022 1114   PROTEINUR 100 (A) 06/29/2022 1114   UROBILINOGEN 0.2 02/06/2022 1647   UROBILINOGEN 0.2 07/27/2007 1107   NITRITE NEGATIVE 06/29/2022 1114   LEUKOCYTESUR NEGATIVE 06/29/2022 1114   Sepsis Labs Recent Labs  Lab 06/29/22 1004 07/01/22 0358 07/02/22 0355 07/03/22 0353  WBC 16.2* 13.6* 10.8* 9.6   Microbiology Recent Results (from the past 240 hour(s))  Surgical PCR screen     Status: None   Collection Time: 06/30/22  9:46 PM   Specimen: Nasal Mucosa; Nasal Swab  Result Value Ref Range Status   MRSA, PCR NEGATIVE NEGATIVE Final   Staphylococcus aureus NEGATIVE NEGATIVE Final    Comment: (NOTE) The Xpert SA Assay (FDA approved for NASAL specimens in patients 34 years of age and older), is one component of a comprehensive surveillance program. It is not intended to diagnose infection nor to guide or monitor treatment. Performed at [facility], [address], [place], [place] [address]      Time coordinating discharge: Over 30 minutes  SIGNED:   Alvira Philips [place], DO  [facility] 07/06/2022, 8:27 AM

## 2022-07-06 NOTE — Progress Notes (Signed)
  Daily Progress Note   Patient Name: BENHAMIN DAMBOISE       Date: 07/06/2022 DOB: [birth year]  Age: 82 y.o. MRN#: [id number] Attending Physician: [place], Eric J, DO Primary Care Physician: Nelwyn Salisbury, MD Admit Date: 06/29/2022 Length of Stay: 7 days  As per EMR review, patient being discharged. This provider meet with patient's wife and patient's daughter to discuss pathways for medical care moving forward. Patient going to [facility] SNF for rehab. Had offered outpatient palliative medicine referral to support patient's family to continue GOC discussions moving forward, though they refused due to concerns about cost. Can consider consult for home palliative medicine group in the outpatient setting to assist in complex medical decision making moving forward if family agrees to this. Thank you.    Alvester Morin, DO Palliative Care Provider PMT # [phone]

## 2022-07-09 DIAGNOSIS — L89890 Pressure ulcer of other site, unstageable: Secondary | ICD-10-CM | POA: Diagnosis not present

## 2022-07-09 DIAGNOSIS — L89620 Pressure ulcer of left heel, unstageable: Secondary | ICD-10-CM | POA: Diagnosis not present

## 2022-07-09 DIAGNOSIS — E1169 Type 2 diabetes mellitus with other specified complication: Secondary | ICD-10-CM | POA: Diagnosis not present

## 2022-07-09 DIAGNOSIS — L89896 Pressure-induced deep tissue damage of other site: Secondary | ICD-10-CM | POA: Diagnosis not present

## 2022-07-15 DIAGNOSIS — L89896 Pressure-induced deep tissue damage of other site: Secondary | ICD-10-CM | POA: Diagnosis not present

## 2022-07-15 DIAGNOSIS — E1169 Type 2 diabetes mellitus with other specified complication: Secondary | ICD-10-CM | POA: Diagnosis not present

## 2022-07-15 DIAGNOSIS — L89620 Pressure ulcer of left heel, unstageable: Secondary | ICD-10-CM | POA: Diagnosis not present

## 2022-07-15 DIAGNOSIS — L89890 Pressure ulcer of other site, unstageable: Secondary | ICD-10-CM | POA: Diagnosis not present

## 2022-07-22 DIAGNOSIS — L89620 Pressure ulcer of left heel, unstageable: Secondary | ICD-10-CM | POA: Diagnosis not present

## 2022-07-22 DIAGNOSIS — E1169 Type 2 diabetes mellitus with other specified complication: Secondary | ICD-10-CM | POA: Diagnosis not present

## 2022-07-22 DIAGNOSIS — L89896 Pressure-induced deep tissue damage of other site: Secondary | ICD-10-CM | POA: Diagnosis not present

## 2022-07-22 DIAGNOSIS — L89890 Pressure ulcer of other site, unstageable: Secondary | ICD-10-CM | POA: Diagnosis not present

## 2022-07-27 DIAGNOSIS — R1311 Dysphagia, oral phase: Secondary | ICD-10-CM | POA: Diagnosis not present

## 2022-07-27 DIAGNOSIS — I10 Essential (primary) hypertension: Secondary | ICD-10-CM | POA: Diagnosis not present

## 2022-07-27 DIAGNOSIS — G20B2 Parkinson's disease with dyskinesia, with fluctuations: Secondary | ICD-10-CM | POA: Diagnosis not present

## 2022-07-27 DIAGNOSIS — T84196D Other mechanical complication of internal fixation device of bone of right lower leg, subsequent encounter: Secondary | ICD-10-CM | POA: Diagnosis not present

## 2022-07-27 DIAGNOSIS — R2689 Other abnormalities of gait and mobility: Secondary | ICD-10-CM | POA: Diagnosis not present

## 2022-07-27 DIAGNOSIS — M6281 Muscle weakness (generalized): Secondary | ICD-10-CM | POA: Diagnosis not present

## 2022-07-27 DIAGNOSIS — F0394 Unspecified dementia, unspecified severity, with anxiety: Secondary | ICD-10-CM | POA: Diagnosis not present

## 2022-07-28 DIAGNOSIS — S72042D Displaced fracture of base of neck of left femur, subsequent encounter for closed fracture with routine healing: Secondary | ICD-10-CM | POA: Diagnosis not present

## 2022-07-29 DIAGNOSIS — L89890 Pressure ulcer of other site, unstageable: Secondary | ICD-10-CM | POA: Diagnosis not present

## 2022-07-29 DIAGNOSIS — L89896 Pressure-induced deep tissue damage of other site: Secondary | ICD-10-CM | POA: Diagnosis not present

## 2022-07-29 DIAGNOSIS — E1169 Type 2 diabetes mellitus with other specified complication: Secondary | ICD-10-CM | POA: Diagnosis not present

## 2022-07-29 DIAGNOSIS — L89620 Pressure ulcer of left heel, unstageable: Secondary | ICD-10-CM | POA: Diagnosis not present

## 2022-08-05 DIAGNOSIS — L89890 Pressure ulcer of other site, unstageable: Secondary | ICD-10-CM | POA: Diagnosis not present

## 2022-08-05 DIAGNOSIS — L89896 Pressure-induced deep tissue damage of other site: Secondary | ICD-10-CM | POA: Diagnosis not present

## 2022-08-05 DIAGNOSIS — E1169 Type 2 diabetes mellitus with other specified complication: Secondary | ICD-10-CM | POA: Diagnosis not present

## 2022-08-05 DIAGNOSIS — L89620 Pressure ulcer of left heel, unstageable: Secondary | ICD-10-CM | POA: Diagnosis not present

## 2022-08-06 DIAGNOSIS — I10 Essential (primary) hypertension: Secondary | ICD-10-CM | POA: Diagnosis not present

## 2022-08-12 DIAGNOSIS — L89620 Pressure ulcer of left heel, unstageable: Secondary | ICD-10-CM | POA: Diagnosis not present

## 2022-08-12 DIAGNOSIS — E1169 Type 2 diabetes mellitus with other specified complication: Secondary | ICD-10-CM | POA: Diagnosis not present

## 2022-08-12 DIAGNOSIS — L89896 Pressure-induced deep tissue damage of other site: Secondary | ICD-10-CM | POA: Diagnosis not present

## 2022-08-12 DIAGNOSIS — L89890 Pressure ulcer of other site, unstageable: Secondary | ICD-10-CM | POA: Diagnosis not present

## 2022-08-19 DIAGNOSIS — L89620 Pressure ulcer of left heel, unstageable: Secondary | ICD-10-CM | POA: Diagnosis not present

## 2022-08-19 DIAGNOSIS — L89890 Pressure ulcer of other site, unstageable: Secondary | ICD-10-CM | POA: Diagnosis not present

## 2022-08-19 DIAGNOSIS — L89896 Pressure-induced deep tissue damage of other site: Secondary | ICD-10-CM | POA: Diagnosis not present

## 2022-08-19 DIAGNOSIS — E1169 Type 2 diabetes mellitus with other specified complication: Secondary | ICD-10-CM | POA: Diagnosis not present

## 2022-08-26 DIAGNOSIS — R1311 Dysphagia, oral phase: Secondary | ICD-10-CM | POA: Diagnosis not present

## 2022-08-26 DIAGNOSIS — I739 Peripheral vascular disease, unspecified: Secondary | ICD-10-CM | POA: Diagnosis not present

## 2022-08-26 DIAGNOSIS — L89896 Pressure-induced deep tissue damage of other site: Secondary | ICD-10-CM | POA: Diagnosis not present

## 2022-08-26 DIAGNOSIS — L89890 Pressure ulcer of other site, unstageable: Secondary | ICD-10-CM | POA: Diagnosis not present

## 2022-08-26 DIAGNOSIS — M6281 Muscle weakness (generalized): Secondary | ICD-10-CM | POA: Diagnosis not present

## 2022-08-26 DIAGNOSIS — G20B2 Parkinson's disease with dyskinesia, with fluctuations: Secondary | ICD-10-CM | POA: Diagnosis not present

## 2022-08-26 DIAGNOSIS — S72002K Fracture of unspecified part of neck of left femur, subsequent encounter for closed fracture with nonunion: Secondary | ICD-10-CM | POA: Diagnosis not present

## 2022-08-26 DIAGNOSIS — T84195D Other mechanical complication of internal fixation device of left femur, subsequent encounter: Secondary | ICD-10-CM | POA: Diagnosis not present

## 2022-08-26 DIAGNOSIS — R2689 Other abnormalities of gait and mobility: Secondary | ICD-10-CM | POA: Diagnosis not present

## 2022-08-26 DIAGNOSIS — E1169 Type 2 diabetes mellitus with other specified complication: Secondary | ICD-10-CM | POA: Diagnosis not present

## 2022-08-26 DIAGNOSIS — L89620 Pressure ulcer of left heel, unstageable: Secondary | ICD-10-CM | POA: Diagnosis not present

## 2022-08-26 DIAGNOSIS — F0394 Unspecified dementia, unspecified severity, with anxiety: Secondary | ICD-10-CM | POA: Diagnosis not present

## 2022-08-26 DIAGNOSIS — I10 Essential (primary) hypertension: Secondary | ICD-10-CM | POA: Diagnosis not present

## 2022-08-28 ENCOUNTER — Encounter: Payer: Self-pay | Admitting: Physician Assistant

## 2022-08-28 ENCOUNTER — Ambulatory Visit (INDEPENDENT_AMBULATORY_CARE_PROVIDER_SITE_OTHER): Payer: Medicare HMO | Admitting: Physician Assistant

## 2022-08-28 VITALS — BP 88/56 | HR 72 | Resp 18 | Ht 68.0 in

## 2022-08-28 DIAGNOSIS — G20B2 Parkinson's disease with dyskinesia, with fluctuations: Secondary | ICD-10-CM | POA: Diagnosis not present

## 2022-08-28 DIAGNOSIS — G301 Alzheimer's disease with late onset: Secondary | ICD-10-CM

## 2022-08-28 DIAGNOSIS — I10 Essential (primary) hypertension: Secondary | ICD-10-CM | POA: Diagnosis not present

## 2022-08-28 DIAGNOSIS — M6281 Muscle weakness (generalized): Secondary | ICD-10-CM | POA: Diagnosis not present

## 2022-08-28 DIAGNOSIS — F0394 Unspecified dementia, unspecified severity, with anxiety: Secondary | ICD-10-CM | POA: Diagnosis not present

## 2022-08-28 DIAGNOSIS — G20A1 Parkinson's disease without dyskinesia, without mention of fluctuations: Secondary | ICD-10-CM | POA: Diagnosis not present

## 2022-08-28 DIAGNOSIS — R1311 Dysphagia, oral phase: Secondary | ICD-10-CM | POA: Diagnosis not present

## 2022-08-28 DIAGNOSIS — R2689 Other abnormalities of gait and mobility: Secondary | ICD-10-CM | POA: Diagnosis not present

## 2022-08-28 DIAGNOSIS — F02818 Dementia in other diseases classified elsewhere, unspecified severity, with other behavioral disturbance: Secondary | ICD-10-CM

## 2022-08-28 NOTE — Progress Notes (Signed)
Assessment/Plan:   Dementia likely due to Alzheimer's Disease   Joe Murray is a very pleasant 82 y.o. RH male with a history of hypertension, hyperlipidemia, diabetes, CAD, CKD stage III AA, GERD, and recent hospitalization on April 2024 for left femoral neck fracture status post pinning and on May 2024 for left hip pain and inability to ambulate, requiring PT and discharged to SNF for rehab with mild dementia likely due to Alzheimer's disease with possible Lewy body component per neuropsych evaluation in 2021, seen today in follow up for memory loss. Patient is currently on memantine 10 mg twice daily.  Unfortunately, his cognitive status has significantly declined in view of 2 presentations to the hospital, and recent surgery, along with possible progression of his dementia.  Discussed with the family discontinuing memantine, as the risk outweighed the benefits, and because the medication is no longer therapeutic.  Family patient psychiatric nurse practitioner who recommends Nuplazid for better mood control, agree with the plan.  Patient is unable to follow commands, has moments of agitation during the visit, and is wheelchair-bound.  Moreover, he has poor oral intake, and his blood pressure is on the lower side, despite not being on any antihypertensives.  His tremors are controlled with carbidopa levodopa.   Follow up in 6 months Continue close supervision, he needs 24/7 care, currently he is at [facility] Agree with psychiatric care, he is to start on Nuplazid Agree with geriatric psychiatrist evaluation for legal issues including competency, ability to make decisions. Discontinue memantine Monitor his blood pressure, is low today, in view of his poor fluid intake. Continue Sinemet 1 tablet 3 times a day with meals     Subjective:    This patient is accompanied in the office by his wife and his daughter who supplements the history.  Previous records as well as any outside records  available were reviewed prior to todays visit. Patient was last seen on 02/27/2022, with last MMSE in July 2023 at 23/30.    Any changes in memory since last visit? " Is much worse than before ".  He is more confused all day, especially in the morning, or when waking up from a nap. Patient unable to remember recent conversations and people names.  In today's visit he cannot engage in any conversation. repeats oneself?  Does not repeat himself because he is not engaging in sensible conversations.   Disoriented when walking into a room?  Most of the day. Leaving objects in unusual places? He is wheelchair bound   Wandering behavior?  He is wheelchair-bound, unable to wander off.  He lives in SNF at this point Any personality changes since last visit?  More disoriented, has moments of agitation.  Seroquel helps at night.  He also is takes Ativan.  Psychiatric nurse practitioner is evaluating changing the regimen. Any worsening depression?:  denies   Hallucinations or paranoia? All the time, most of the day. Seizures? denies    Any sleep changes?  He is in SNF, so they don't know, but wife suspects that he does not sleep.    Sleep apnea? No  Any hygiene concerns? They wash him in the bed since his fracture. Independent of bathing and dressing?  He needs assistance to get dressed  Does the patient needs help with medications?  Facility is in charge   Who is in charge of the finances?  Wife is in charge     Any changes in appetite? Not drinking enough water, but appetite is  not bad ""depends what the brain is like that day". Patient have trouble swallowing? denies   Does the patient cook? No Any headaches?   denies   Chronic back pain  denies   Ambulates with difficulty? Wheelchair bound .  Recent falls or head injuries? Denies  Unilateral weakness, numbness or tingling? denies   Any tremors?  Tremors are controlled with the medications.  Today they are not seen. Any anosmia?  Patient denies    Any incontinence of urine?  Endorsed. Wears diapers.  He was tested recently for UTI, this was negative.  Any bowel dysfunction?   She has chronic constipation      Patient lives with his wife  Does the patient drive? No longer drives       History on Initial Assessment 04/06/2018: This is a 82 year old right-handed man with a history of hypertension, hyperlipidemia, CAD, diabetes, presenting for evaluation of memory loss. He reports his memory is lousy. He started noticing memory changes around a year ago. His wife feels memory changes started a few years ago which she attributes to statin use, however in the past year, forgetfulness has worsened. He watches a lot of Westerns but would watch the same show 1-2 weeks later, his wife reminds him he has seen it already but he denies it. He forgets conversations. He does not remember people from the past, someone would pass away and he would not remember then. No significant difficulties following directions/instructions. His wife has not noticed significant memory decline in the past year. He drives minimally due to vision issues, and denies getting lost. His wife manages finances. He was previously on several medications that were stopped 2 months ago due to fatigue and memory concerns. He is only taking glipizide. He had muscle pains on statin in the past, his wife feels the Repatha helped improve his cholesterol levels but also caused leg pain. He reports right leg pain mostly after walking a few steps, improving when he rests. His wife has noticed slowed movements, he would be very slow when opening things. He has a little trouble getting out of a chair and feels his balance is off when he first stands, but feels fine once he starts walking. His wife  has noticed over the past year that gait has slowed down and his stride has been shorter. He speaks in a low voice and does not move his mouth much when talking, his wife reports he has always been this way.  No major personality changes, no paranoia or hallucinations. No family history of dementia, no history of significant head injuries. He drinks alcohol once a week.   He has had headaches over the past year localized over the back of his left ear. He describes a throbbing pain that only occurs in the evening around 30-40 minutes after he sits in his recliner. It would start wearing off after a few hours and resolves by bedtime. He takes Tylenol every evening. No associated nausea/vomiting, photo/phonophobia. He has trouble seeing the words on TV and reports monocular diplopia in his right eye. He denies any dizziness, dysarthria/dysphagia, neck/back pain, focal numbness/tingling, bowel/bladder dysfunction, anosmia, no falls. Sleep is good, he has some daytime drowsiness and snores minimally. No REM behavior disorder noted by wife. He has had occasional left hand tremors the past year, he denies any difficulty writing with his right hand or using utensils. He is a retired Architect.    Diagnostic Data:  TSH normal, B12 low normal  312.  MRI brain without contrast done 03/2018 did not show any acute changes. There was mild to moderate diffuse atrophy and chronic microvascular disease.    Neuropsychological evaluation in 01/2020 indicated memory storage type problems, executive dysfunction, diminished processing efficiency, likely due to Alzheimer's disease. His parkinsonism suggests there may be a Lewy Body spectrum component.  PREVIOUS MEDICATIONS:   CURRENT MEDICATIONS:  Outpatient Encounter Medications as of 08/28/2022  Medication Sig   acetaminophen (TYLENOL) 500 MG tablet Take 2 tablets (1,000 mg total) by mouth 3 (three) times daily.   carbidopa-levodopa (SINEMET IR) 25-100 MG tablet Take 1 tablet three times a day with meals   glipiZIDE (GLUCOTROL) 5 MG tablet Take 1 tablet (5 mg total) by mouth 2 (two) times daily before a meal.   Lancets (BD LANCET ULTRAFINE 30G) MISC USE ONE  TO CHECK  GLUCOSE ONCE DAILY   memantine (NAMENDA) 10 MG tablet Take 1 tablet by mouth twice daily   polyethylene glycol (MIRALAX / GLYCOLAX) 17 g packet Take 17 g by mouth daily as needed for mild constipation.   prednisoLONE acetate (PRED FORTE) 1 % ophthalmic suspension Place 1 drop into the right eye daily.   QUEtiapine (SEROQUEL) 25 MG tablet Take 3 tablets (75 mg total) by mouth at bedtime.   senna-docusate (SENOKOT-S) 8.6-50 MG tablet Take 1 tablet by mouth 2 (two) times daily.   enoxaparin (LOVENOX) 40 MG/0.4ML injection Inject 0.4 mLs (40 mg total) into the skin daily for 28 days.   Evolocumab with Infusor (REPATHA PUSHTRONEX SYSTEM) 420 MG/3.5ML SOCT Inject 3.5 mLs into the skin every 30 (thirty) days. (Patient not taking: Reported on 08/28/2022)   melatonin 3 MG TABS tablet Take 3 mg by mouth at bedtime. (Patient not taking: Reported on 08/28/2022)   nitroGLYCERIN (NITROSTAT) 0.4 MG SL tablet PLACE 1 TABLET UNDER THE TONGUE EVERY 5 MINUTES FOR 3 DOSES AS NEEDED FOR CHEST PAIN (Patient not taking: Reported on 06/29/2022)   vitamin B-12 (CYANOCOBALAMIN) 1000 MCG tablet Take 1,000 mcg by mouth daily. (Patient not taking: Reported on 08/28/2022)   No facility-administered encounter medications on file as of 08/28/2022.       08/27/2021   10:00 AM 02/21/2020   11:00 AM 12/28/2019    9:00 AM  MMSE - Mini Mental State Exam  Orientation to time 4 3 4   Orientation to Place 5 5 5   Registration 3 3 3   Attention/ Calculation 2 3 5   Recall 2 1 0  Language- name 2 objects 2 2 2   Language- repeat 1 1 1   Language- follow 3 step command 3 3 3   Language- read & follow direction 1 1 1   Write a sentence 0 1 1  Copy design 0 1 1  Total score 23 24 26       11/11/2018   11:00 AM 04/06/2018    9:00 AM  Montreal Cognitive Assessment   Visuospatial/ Executive (0/5)  4  Naming (0/3)  3  Attention: Read list of digits (0/2) 2 2  Attention: Read list of letters (0/1) 1 1  Attention: Serial 7 subtraction starting at  100 (0/3) 3 3  Language: Repeat phrase (0/2) 1 1  Language : Fluency (0/1) 0 0  Abstraction (0/2) 1 2  Delayed Recall (0/5) 0 2  Orientation (0/6) 5 6  Total  24  Adjusted Score (based on education)  25    Objective:     PHYSICAL EXAMINATION:    VITALS:   Vitals:   08/28/22 1057  BP: (!) 88/56  Pulse: 72  Resp: 18  SpO2: 98%  Height: 5\' 8"  (1.727 m)    GEN:  The patient appears stated age and is in NAD. HEENT:  Normocephalic, atraumatic.   Neurological examination:  General: NAD, well-groomed, appears stated age.  Anxious appearing. Orientation: The patient is alert.  Not oriented to person, place or date. Cranial nerves: There is good facial symmetry.The speech is unintelligible, but there is no aphasia or dysarthria.  Fund of knowledge is reduced. Recent and remote memory are impaired. Attention and concentration are reduced.  Unable to name objects and repeat phrases.  Hearing is intact to conversational tone.   Sensation: Sensation is intact to light touch throughout Motor: Unable to test as the patient does not comprehend the commands  DTR's 2/4 in UE/LE     Movement examination: Tone: There is normal tone in the UE/LE Abnormal movements:  no tremor seen today on exam.  No myoclonus.  No asterixis.   Coordination: Unable to test, the patient does not follow verbal commands.  He is wheelchair-bound, unable to test gait or stride.  Thank you for allowing [place] the opportunity to participate in the care of this nice patient. Please do not hesitate to contact us for any questions or concerns.   Total time spent on today's visit was 35 minutes dedicated to this patient today, preparing to see patient, examining the patient, ordering tests and/or medications and counseling the patient, documenting clinical information in the EHR or other health record, independently interpreting results and communicating results to the patient/family, discussing treatment and goals, answering  patient's questions and coordinating care.  Cc:  Eloisa Northern, MD  Marlowe Kays 08/28/2022 11:35 AM

## 2022-08-28 NOTE — Patient Instructions (Signed)
Good to see you.  Discontinue  Memantine 10mg  twice a day  2.Continue Sinemet (Carbidopa/Levodopa) 25/100mg : take 1 tablet three times a day with meals  Increase hydration   Agree with Nuplazid but careful with Ativan to avoid oversedation   4. Follow-up in 6 months, call for any changes                                        FALL PRECAUTIONS: Be cautious when walking. Scan the area for obstacles that may increase the risk of trips and falls. When getting up in the mornings, sit up at the edge of the bed for a few minutes before getting out of bed. Consider elevating the bed at the head end to avoid drop of blood pressure when getting up. Walk always in a well-lit room (use night lights in the walls). Avoid area rugs or power cords from appliances in the middle of the walkways. Use a walker or a cane if necessary and consider physical therapy for balance exercise. Get your eyesight checked regularly.  HOME SAFETY: Consider the safety of the kitchen when operating appliances like stoves, microwave oven, and blender. Consider having supervision and share cooking responsibilities until no longer able to participate in those. Accidents with firearms and other hazards in the house should be identified and addressed as well.  ABILITY TO BE LEFT ALONE: If patient is unable to contact 911 operator, consider using LifeLine, or when the need is there, arrange for someone to stay with patients. Smoking is a fire hazard, consider supervision or cessation. Risk of wandering should be assessed by caregiver and if detected at any point, supervision and safe proof recommendations should be instituted.    RECOMMENDATIONS FOR ALL PATIENTS WITH MEMORY PROBLEMS: 1. Continue to exercise (Recommend 30 minutes of walking everyday, or 3 hours every week) 2. Increase social interactions - continue going to [place] and enjoy social gatherings with friends and family 3. Eat healthy,  avoid fried foods and eat more fruits and vegetables 4. Maintain adequate blood pressure, blood sugar, and blood cholesterol level. Reducing the risk of stroke and cardiovascular disease also helps promoting better memory. 5. Avoid stressful situations. Live a simple life and avoid aggravations. Organize your time and prepare for the next day in anticipation. 6. Sleep well, avoid any interruptions of sleep and avoid any distractions in the bedroom that may interfere with adequate sleep quality 7. Avoid sugar, avoid sweets as there is a strong link between excessive sugar intake, diabetes, and cognitive impairment The Mediterranean diet has been shown to help patients reduce the risk of progressive memory disorders and reduces cardiovascular risk. This includes eating fish, eat fruits and green leafy vegetables, nuts like almonds and hazelnuts, walnuts, and also use olive oil. Avoid fast foods and fried foods as much as possible. Avoid sweets and sugar as sugar use has been linked to worsening of memory function.  There is always a concern of gradual progression of memory problems. If this is the case, then we may need to adjust level of care according to patient needs. Support, both to the patient and caregiver, should then be put into place. Dr.

## 2022-08-31 DIAGNOSIS — F0282 Dementia in other diseases classified elsewhere, unspecified severity, with psychotic disturbance: Secondary | ICD-10-CM | POA: Diagnosis not present

## 2022-08-31 DIAGNOSIS — G20A1 Parkinson's disease without dyskinesia, without mention of fluctuations: Secondary | ICD-10-CM | POA: Diagnosis not present

## 2022-09-02 DIAGNOSIS — L89890 Pressure ulcer of other site, unstageable: Secondary | ICD-10-CM | POA: Diagnosis not present

## 2022-09-02 DIAGNOSIS — L89620 Pressure ulcer of left heel, unstageable: Secondary | ICD-10-CM | POA: Diagnosis not present

## 2022-09-02 DIAGNOSIS — L89896 Pressure-induced deep tissue damage of other site: Secondary | ICD-10-CM | POA: Diagnosis not present

## 2022-09-02 DIAGNOSIS — E1169 Type 2 diabetes mellitus with other specified complication: Secondary | ICD-10-CM | POA: Diagnosis not present

## 2022-09-04 ENCOUNTER — Telehealth: Payer: Self-pay | Admitting: Physician Assistant

## 2022-09-04 NOTE — Telephone Encounter (Signed)
Noted thanks °

## 2022-09-04 NOTE — Telephone Encounter (Signed)
Joe Murray is calling back. Joe Murray needed a fax number - it is [phone]. Joe Murray is from [employer]

## 2022-09-09 DIAGNOSIS — L89620 Pressure ulcer of left heel, unstageable: Secondary | ICD-10-CM | POA: Diagnosis not present

## 2022-09-09 DIAGNOSIS — E1169 Type 2 diabetes mellitus with other specified complication: Secondary | ICD-10-CM | POA: Diagnosis not present

## 2022-09-09 DIAGNOSIS — L89896 Pressure-induced deep tissue damage of other site: Secondary | ICD-10-CM | POA: Diagnosis not present

## 2022-09-09 DIAGNOSIS — L89890 Pressure ulcer of other site, unstageable: Secondary | ICD-10-CM | POA: Diagnosis not present

## 2022-09-14 DIAGNOSIS — G20A1 Parkinson's disease without dyskinesia, without mention of fluctuations: Secondary | ICD-10-CM | POA: Diagnosis not present

## 2022-09-14 DIAGNOSIS — F0282 Dementia in other diseases classified elsewhere, unspecified severity, with psychotic disturbance: Secondary | ICD-10-CM | POA: Diagnosis not present

## 2022-09-14 DIAGNOSIS — M25552 Pain in left hip: Secondary | ICD-10-CM | POA: Diagnosis not present

## 2022-09-14 DIAGNOSIS — M6281 Muscle weakness (generalized): Secondary | ICD-10-CM | POA: Diagnosis not present

## 2022-09-15 DIAGNOSIS — M25552 Pain in left hip: Secondary | ICD-10-CM | POA: Diagnosis not present

## 2022-09-15 DIAGNOSIS — M6281 Muscle weakness (generalized): Secondary | ICD-10-CM | POA: Diagnosis not present

## 2022-09-16 DIAGNOSIS — M6281 Muscle weakness (generalized): Secondary | ICD-10-CM | POA: Diagnosis not present

## 2022-09-16 DIAGNOSIS — L89896 Pressure-induced deep tissue damage of other site: Secondary | ICD-10-CM | POA: Diagnosis not present

## 2022-09-16 DIAGNOSIS — L89620 Pressure ulcer of left heel, unstageable: Secondary | ICD-10-CM | POA: Diagnosis not present

## 2022-09-16 DIAGNOSIS — E1169 Type 2 diabetes mellitus with other specified complication: Secondary | ICD-10-CM | POA: Diagnosis not present

## 2022-09-16 DIAGNOSIS — M25552 Pain in left hip: Secondary | ICD-10-CM | POA: Diagnosis not present

## 2022-09-16 DIAGNOSIS — L89890 Pressure ulcer of other site, unstageable: Secondary | ICD-10-CM | POA: Diagnosis not present

## 2022-09-23 DIAGNOSIS — L89620 Pressure ulcer of left heel, unstageable: Secondary | ICD-10-CM | POA: Diagnosis not present

## 2022-09-24 DIAGNOSIS — I10 Essential (primary) hypertension: Secondary | ICD-10-CM | POA: Diagnosis not present

## 2022-09-24 DIAGNOSIS — N390 Urinary tract infection, site not specified: Secondary | ICD-10-CM | POA: Diagnosis not present

## 2022-09-25 DIAGNOSIS — F0390 Unspecified dementia without behavioral disturbance: Secondary | ICD-10-CM | POA: Diagnosis not present

## 2022-09-25 DIAGNOSIS — F411 Generalized anxiety disorder: Secondary | ICD-10-CM | POA: Diagnosis not present

## 2022-09-28 DIAGNOSIS — F0282 Dementia in other diseases classified elsewhere, unspecified severity, with psychotic disturbance: Secondary | ICD-10-CM | POA: Diagnosis not present

## 2022-09-28 DIAGNOSIS — F411 Generalized anxiety disorder: Secondary | ICD-10-CM | POA: Diagnosis not present

## 2022-09-28 DIAGNOSIS — G4700 Insomnia, unspecified: Secondary | ICD-10-CM | POA: Diagnosis not present

## 2022-10-08 DIAGNOSIS — E1169 Type 2 diabetes mellitus with other specified complication: Secondary | ICD-10-CM | POA: Diagnosis not present

## 2022-10-08 DIAGNOSIS — E538 Deficiency of other specified B group vitamins: Secondary | ICD-10-CM | POA: Diagnosis not present

## 2022-10-08 DIAGNOSIS — L89896 Pressure-induced deep tissue damage of other site: Secondary | ICD-10-CM | POA: Diagnosis not present

## 2022-10-08 DIAGNOSIS — E782 Mixed hyperlipidemia: Secondary | ICD-10-CM | POA: Diagnosis not present

## 2022-10-12 DIAGNOSIS — W19XXXA Unspecified fall, initial encounter: Secondary | ICD-10-CM

## 2022-10-12 DIAGNOSIS — M6281 Muscle weakness (generalized): Secondary | ICD-10-CM | POA: Diagnosis not present

## 2022-10-12 DIAGNOSIS — R2689 Other abnormalities of gait and mobility: Secondary | ICD-10-CM | POA: Diagnosis not present

## 2022-10-12 DIAGNOSIS — R1311 Dysphagia, oral phase: Secondary | ICD-10-CM

## 2022-10-12 DIAGNOSIS — I10 Essential (primary) hypertension: Secondary | ICD-10-CM | POA: Diagnosis not present

## 2022-10-12 DIAGNOSIS — G20B2 Parkinson's disease with dyskinesia, with fluctuations: Secondary | ICD-10-CM | POA: Diagnosis not present

## 2022-10-12 DIAGNOSIS — F0394 Unspecified dementia, unspecified severity, with anxiety: Secondary | ICD-10-CM

## 2022-10-14 DIAGNOSIS — L89896 Pressure-induced deep tissue damage of other site: Secondary | ICD-10-CM | POA: Diagnosis not present

## 2022-10-14 DIAGNOSIS — E782 Mixed hyperlipidemia: Secondary | ICD-10-CM | POA: Diagnosis not present

## 2022-10-14 DIAGNOSIS — E538 Deficiency of other specified B group vitamins: Secondary | ICD-10-CM | POA: Diagnosis not present

## 2022-10-14 DIAGNOSIS — E1169 Type 2 diabetes mellitus with other specified complication: Secondary | ICD-10-CM | POA: Diagnosis not present

## 2022-10-21 DIAGNOSIS — L89896 Pressure-induced deep tissue damage of other site: Secondary | ICD-10-CM | POA: Diagnosis not present

## 2022-10-21 DIAGNOSIS — E782 Mixed hyperlipidemia: Secondary | ICD-10-CM | POA: Diagnosis not present

## 2022-10-21 DIAGNOSIS — E1169 Type 2 diabetes mellitus with other specified complication: Secondary | ICD-10-CM | POA: Diagnosis not present

## 2022-10-21 DIAGNOSIS — E538 Deficiency of other specified B group vitamins: Secondary | ICD-10-CM | POA: Diagnosis not present

## 2022-10-26 DIAGNOSIS — G20B2 Parkinson's disease with dyskinesia, with fluctuations: Secondary | ICD-10-CM | POA: Diagnosis not present

## 2022-10-26 DIAGNOSIS — F0394 Unspecified dementia, unspecified severity, with anxiety: Secondary | ICD-10-CM

## 2022-10-26 DIAGNOSIS — M6281 Muscle weakness (generalized): Secondary | ICD-10-CM

## 2022-10-26 DIAGNOSIS — R1311 Dysphagia, oral phase: Secondary | ICD-10-CM | POA: Diagnosis not present

## 2022-10-26 DIAGNOSIS — R32 Unspecified urinary incontinence: Secondary | ICD-10-CM

## 2022-10-26 DIAGNOSIS — N390 Urinary tract infection, site not specified: Secondary | ICD-10-CM | POA: Diagnosis not present

## 2022-10-26 DIAGNOSIS — I10 Essential (primary) hypertension: Secondary | ICD-10-CM | POA: Diagnosis not present

## 2022-10-26 DIAGNOSIS — R2689 Other abnormalities of gait and mobility: Secondary | ICD-10-CM

## 2022-10-28 DIAGNOSIS — E538 Deficiency of other specified B group vitamins: Secondary | ICD-10-CM | POA: Diagnosis not present

## 2022-10-28 DIAGNOSIS — L89896 Pressure-induced deep tissue damage of other site: Secondary | ICD-10-CM | POA: Diagnosis not present

## 2022-10-28 DIAGNOSIS — E782 Mixed hyperlipidemia: Secondary | ICD-10-CM | POA: Diagnosis not present

## 2022-10-28 DIAGNOSIS — E1169 Type 2 diabetes mellitus with other specified complication: Secondary | ICD-10-CM | POA: Diagnosis not present

## 2022-11-03 DIAGNOSIS — G20A1 Parkinson's disease without dyskinesia, without mention of fluctuations: Secondary | ICD-10-CM | POA: Diagnosis not present

## 2022-11-03 DIAGNOSIS — G4700 Insomnia, unspecified: Secondary | ICD-10-CM | POA: Diagnosis not present

## 2022-11-03 DIAGNOSIS — F0282 Dementia in other diseases classified elsewhere, unspecified severity, with psychotic disturbance: Secondary | ICD-10-CM | POA: Diagnosis not present

## 2022-11-03 DIAGNOSIS — F411 Generalized anxiety disorder: Secondary | ICD-10-CM | POA: Diagnosis not present

## 2022-11-04 DIAGNOSIS — E538 Deficiency of other specified B group vitamins: Secondary | ICD-10-CM | POA: Diagnosis not present

## 2022-11-04 DIAGNOSIS — L89896 Pressure-induced deep tissue damage of other site: Secondary | ICD-10-CM | POA: Diagnosis not present

## 2022-11-04 DIAGNOSIS — E1169 Type 2 diabetes mellitus with other specified complication: Secondary | ICD-10-CM | POA: Diagnosis not present

## 2022-11-04 DIAGNOSIS — E782 Mixed hyperlipidemia: Secondary | ICD-10-CM | POA: Diagnosis not present

## 2022-11-11 DIAGNOSIS — L89896 Pressure-induced deep tissue damage of other site: Secondary | ICD-10-CM | POA: Diagnosis not present

## 2022-11-11 DIAGNOSIS — E538 Deficiency of other specified B group vitamins: Secondary | ICD-10-CM | POA: Diagnosis not present

## 2022-11-11 DIAGNOSIS — E782 Mixed hyperlipidemia: Secondary | ICD-10-CM | POA: Diagnosis not present

## 2022-11-11 DIAGNOSIS — E1169 Type 2 diabetes mellitus with other specified complication: Secondary | ICD-10-CM | POA: Diagnosis not present

## 2022-12-03 DIAGNOSIS — R2689 Other abnormalities of gait and mobility: Secondary | ICD-10-CM

## 2022-12-03 DIAGNOSIS — G20B2 Parkinson's disease with dyskinesia, with fluctuations: Secondary | ICD-10-CM | POA: Diagnosis not present

## 2022-12-03 DIAGNOSIS — I10 Essential (primary) hypertension: Secondary | ICD-10-CM | POA: Diagnosis not present

## 2022-12-03 DIAGNOSIS — M6281 Muscle weakness (generalized): Secondary | ICD-10-CM | POA: Diagnosis not present

## 2022-12-03 DIAGNOSIS — R1311 Dysphagia, oral phase: Secondary | ICD-10-CM

## 2022-12-03 DIAGNOSIS — F0394 Unspecified dementia, unspecified severity, with anxiety: Secondary | ICD-10-CM | POA: Diagnosis not present

## 2023-03-01 ENCOUNTER — Ambulatory Visit: Payer: Medicare HMO | Admitting: Physician Assistant

## 2023-03-27 DEATH — deceased

## 2023-04-02 ENCOUNTER — Telehealth: Payer: Self-pay | Admitting: Internal Medicine

## 2023-04-02 NOTE — Telephone Encounter (Signed)
 Patient has passed away patient wife called in to notify the office
